# Patient Record
Sex: Female | Born: 1990
Health system: Southern US, Community
[De-identification: ages and names within clinical notes are randomized; demographics above are authoritative.]

## PROBLEM LIST (undated history)

## (undated) DIAGNOSIS — G43909 Migraine, unspecified, not intractable, without status migrainosus: Secondary | ICD-10-CM

## (undated) DIAGNOSIS — R51 Headache: Secondary | ICD-10-CM

## (undated) DIAGNOSIS — F50819 Binge eating disorder, unspecified: Secondary | ICD-10-CM

## (undated) DIAGNOSIS — N730 Acute parametritis and pelvic cellulitis: Secondary | ICD-10-CM

## (undated) DIAGNOSIS — F5081 Binge eating disorder: Secondary | ICD-10-CM

## (undated) DIAGNOSIS — F411 Generalized anxiety disorder: Secondary | ICD-10-CM

## (undated) DIAGNOSIS — F952 Tourette's disorder: Secondary | ICD-10-CM

## (undated) DIAGNOSIS — A499 Bacterial infection, unspecified: Secondary | ICD-10-CM

## (undated) DIAGNOSIS — J45909 Unspecified asthma, uncomplicated: Secondary | ICD-10-CM

## (undated) DIAGNOSIS — I499 Cardiac arrhythmia, unspecified: Secondary | ICD-10-CM

## (undated) DIAGNOSIS — T7840XA Allergy, unspecified, initial encounter: Secondary | ICD-10-CM

## (undated) DIAGNOSIS — N390 Urinary tract infection, site not specified: Secondary | ICD-10-CM

## (undated) DIAGNOSIS — J302 Other seasonal allergic rhinitis: Secondary | ICD-10-CM

## (undated) DIAGNOSIS — R519 Headache, unspecified: Secondary | ICD-10-CM

## (undated) DIAGNOSIS — F325 Major depressive disorder, single episode, in full remission: Secondary | ICD-10-CM

## (undated) DIAGNOSIS — K219 Gastro-esophageal reflux disease without esophagitis: Secondary | ICD-10-CM

## (undated) DIAGNOSIS — F419 Anxiety disorder, unspecified: Secondary | ICD-10-CM

## (undated) HISTORY — DX: Binge eating disorder, unspecified: F50.819

## (undated) HISTORY — PX: WISDOM TOOTH EXTRACTION: SHX21

## (undated) HISTORY — DX: Tourette's disorder: F95.2

## (undated) HISTORY — DX: Gastro-esophageal reflux disease without esophagitis: K21.9

## (undated) HISTORY — DX: Headache: R51

## (undated) HISTORY — PX: CHOLECYSTECTOMY: SHX55

## (undated) HISTORY — DX: Migraine, unspecified, not intractable, without status migrainosus: G43.909

## (undated) HISTORY — DX: Bacterial infection, unspecified: A49.9

## (undated) HISTORY — DX: Other seasonal allergic rhinitis: J30.2

## (undated) HISTORY — DX: Headache, unspecified: R51.9

## (undated) HISTORY — DX: Acute parametritis and pelvic cellulitis: N73.0

## (undated) HISTORY — DX: Urinary tract infection, site not specified: N39.0

## (undated) HISTORY — DX: Allergy, unspecified, initial encounter: T78.40XA

## (undated) HISTORY — DX: Generalized anxiety disorder: F41.1

## (undated) HISTORY — DX: Binge eating disorder: F50.81

## (undated) HISTORY — PX: ESOPHAGOGASTRODUODENOSCOPY: SHX1529

## (undated) HISTORY — DX: Major depressive disorder, single episode, in full remission: F32.5

---
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
## 2011-05-02 HISTORY — PX: TONSILLECTOMY AND ADENOIDECTOMY: SHX28

## 2013-10-16 ENCOUNTER — Ambulatory Visit (INDEPENDENT_AMBULATORY_CARE_PROVIDER_SITE_OTHER): Payer: 59 | Admitting: Nurse Practitioner

## 2013-10-16 ENCOUNTER — Encounter: Payer: Self-pay | Admitting: Nurse Practitioner

## 2013-10-16 VITALS — BP 120/75 | HR 68 | Temp 98.2°F | Ht 65.0 in | Wt 169.0 lb

## 2013-10-16 DIAGNOSIS — F952 Tourette's disorder: Secondary | ICD-10-CM

## 2013-10-16 DIAGNOSIS — R29818 Other symptoms and signs involving the nervous system: Secondary | ICD-10-CM

## 2013-10-16 DIAGNOSIS — R2990 Unspecified symptoms and signs involving the nervous system: Secondary | ICD-10-CM

## 2013-10-16 MED ORDER — AMPHETAMINE-DEXTROAMPHET ER 20 MG PO CP24: 20.0000 mg | ORAL_CAPSULE | Freq: Every day | ORAL | Status: DC

## 2013-10-16 NOTE — Progress Notes (Signed)
Pre visit review using our clinic review tool, if applicable. No additional management support is needed unless otherwise documented below in the visit note. 

## 2013-10-16 NOTE — Patient Instructions (Signed)
Our office will call you after your records are reviewed. Please see neurology. Pleasure to meet you!  Preventive Care for Adults, Female A healthy lifestyle and preventive care can promote health and wellness. Preventive health guidelines for women include the following key practices.  A routine yearly physical is a good way to check with your caregiver about your health and preventive screening. It is a chance to share any concerns and updates on your health, and to receive a thorough exam.  Visit your dentist for a routine exam and preventive care every 6 months. Brush your teeth twice a day and floss once a day. Good oral hygiene prevents tooth decay and gum disease.  The frequency of eye exams is based on your age, health, family medical history, use of contact lenses, and other factors. Follow your caregiver's recommendations for frequency of eye exams.  Eat a healthy diet. Foods like vegetables, fruits, whole grains, low-fat dairy products, and lean protein foods contain the nutrients you need without too many calories. Decrease your intake of foods high in solid fats, added sugars, and salt. Eat the right amount of calories for you.Get information about a proper diet from your caregiver, if necessary.  Regular physical exercise is one of the most important things you can do for your health. Most adults should get at least 150 minutes of moderate-intensity exercise (any activity that increases your heart rate and causes you to sweat) each week. In addition, most adults need muscle-strengthening exercises on 2 or more days a week.  Maintain a healthy weight. The body mass index (BMI) is a screening tool to identify possible weight problems. It provides an estimate of body fat based on height and weight. Your caregiver can help determine your BMI, and can help you achieve or maintain a healthy weight.For adults 20 years and older:  A BMI below 18.5 is considered underweight.  A BMI of 18.5  to 24.9 is normal.  A BMI of 25 to 29.9 is considered overweight.  A BMI of 30 and above is considered obese.  Maintain normal blood lipids and cholesterol levels by exercising and minimizing your intake of saturated fat. Eat a balanced diet with plenty of fruit and vegetables. Blood tests for lipids and cholesterol should begin at age 64 and be repeated every 5 years. If your lipid or cholesterol levels are high, you are over 50, or you are at high risk for heart disease, you may need your cholesterol levels checked more frequently.Ongoing high lipid and cholesterol levels should be treated with medicines if diet and exercise are not effective.  If you smoke, find out from your caregiver how to quit. If you do not use tobacco, do not start.  Lung cancer screening is recommended for adults aged 23 80 years who are at high risk for developing lung cancer because of a history of smoking. Yearly low-dose computed tomography (CT) is recommended for people who have at least a 30-pack-year history of smoking and are a current smoker or have quit within the past 15 years. A pack year of smoking is smoking an average of 1 pack of cigarettes a day for 1 year (for example: 1 pack a day for 30 years or 2 packs a day for 15 years). Yearly screening should continue until the smoker has stopped smoking for at least 15 years. Yearly screening should also be stopped for people who develop a health problem that would prevent them from having lung cancer treatment.  If you are  pregnant, do not drink alcohol. If you are breastfeeding, be very cautious about drinking alcohol. If you are not pregnant and choose to drink alcohol, do not exceed 1 drink per day. One drink is considered to be 12 ounces (355 mL) of beer, 5 ounces (148 mL) of wine, or 1.5 ounces (44 mL) of liquor.  Avoid use of street drugs. Do not share needles with anyone. Ask for help if you need support or instructions about stopping the use of  drugs.  High blood pressure causes heart disease and increases the risk of stroke. Your blood pressure should be checked at least every 1 to 2 years. Ongoing high blood pressure should be treated with medicines if weight loss and exercise are not effective.  If you are 28 to 23 years old, ask your caregiver if you should take aspirin to prevent strokes.  Diabetes screening involves taking a blood sample to check your fasting blood sugar level. This should be done once every 3 years, after age 30, if you are within normal weight and without risk factors for diabetes. Testing should be considered at a younger age or be carried out more frequently if you are overweight and have at least 1 risk factor for diabetes.  Breast cancer screening is essential preventive care for women. You should practice "breast self-awareness." This means understanding the normal appearance and feel of your breasts and may include breast self-examination. Any changes detected, no matter how small, should be reported to a caregiver. Women in their 6s and 30s should have a clinical breast exam (CBE) by a caregiver as part of a regular health exam every 1 to 3 years. After age 40, women should have a CBE every year. Starting at age 77, women should consider having a mammography (breast X-ray test) every year. Women who have a family history of breast cancer should talk to their caregiver about genetic screening. Women at a high risk of breast cancer should talk to their caregivers about having magnetic resonance imaging (MRI) and a mammography every year.  Breast cancer gene (BRCA)-related cancer risk assessment is recommended for women who have family members with BRCA-related cancers. BRCA-related cancers include breast, ovarian, tubal, and peritoneal cancers. Having family members with these cancers may be associated with an increased risk for harmful changes (mutations) in the breast cancer genes BRCA1 and BRCA2. Results of the  assessment will determine the need for genetic counseling and BRCA1 and BRCA2 testing.  The Pap test is a screening test for cervical cancer. A Pap test can show cell changes on the cervix that might become cervical cancer if left untreated. A Pap test is a procedure in which cells are obtained and examined from the lower end of the uterus (cervix).  Women should have a Pap test starting at age 43.  Between ages 23 and 63, Pap tests should be repeated every 2 years.  Beginning at age 10, you should have a Pap test every 3 years as long as the past 3 Pap tests have been normal.  Some women have medical problems that increase the chance of getting cervical cancer. Talk to your caregiver about these problems. It is especially important to talk to your caregiver if a new problem develops soon after your last Pap test. In these cases, your caregiver may recommend more frequent screening and Pap tests.  The above recommendations are the same for women who have or have not gotten the vaccine for human papillomavirus (HPV).  If you had  a hysterectomy for a problem that was not cancer or a condition that could lead to cancer, then you no longer need Pap tests. Even if you no longer need a Pap test, a regular exam is a good idea to make sure no other problems are starting.  If you are between ages 59 and 21, and you have had normal Pap tests going back 10 years, you no longer need Pap tests. Even if you no longer need a Pap test, a regular exam is a good idea to make sure no other problems are starting.  If you have had past treatment for cervical cancer or a condition that could lead to cancer, you need Pap tests and screening for cancer for at least 20 years after your treatment.  If Pap tests have been discontinued, risk factors (such as a new sexual partner) need to be reassessed to determine if screening should be resumed.  The HPV test is an additional test that may be used for cervical cancer  screening. The HPV test looks for the virus that can cause the cell changes on the cervix. The cells collected during the Pap test can be tested for HPV. The HPV test could be used to screen women aged 1 years and older, and should be used in women of any age who have unclear Pap test results. After the age of 76, women should have HPV testing at the same frequency as a Pap test.  Colorectal cancer can be detected and often prevented. Most routine colorectal cancer screening begins at the age of 25 and continues through age 72. However, your caregiver may recommend screening at an earlier age if you have risk factors for colon cancer. On a yearly basis, your caregiver may provide home test kits to check for hidden blood in the stool. Use of a small camera at the end of a tube, to directly examine the colon (sigmoidoscopy or colonoscopy), can detect the earliest forms of colorectal cancer. Talk to your caregiver about this at age 53, when routine screening begins. Direct examination of the colon should be repeated every 5 to 10 years through age 24, unless early forms of pre-cancerous polyps or small growths are found.  Hepatitis C blood testing is recommended for all people born from [birth year] through 1965 and any individual with known risks for hepatitis C.  Practice safe sex. Use condoms and avoid high-risk sexual practices to reduce the spread of sexually transmitted infections (STIs). STIs include gonorrhea, chlamydia, syphilis, trichomonas, herpes, HPV, and human immunodeficiency virus (HIV). Herpes, HIV, and HPV are viral illnesses that have no cure. They can result in disability, cancer, and death. Sexually active women aged 76 and younger should be checked for chlamydia. Older women with new or multiple partners should also be tested for chlamydia. Testing for other STIs is recommended if you are sexually active and at increased risk.  Osteoporosis is a disease in which the bones lose minerals and  strength with aging. This can result in serious bone fractures. The risk of osteoporosis can be identified using a bone density scan. Women ages 18 and over and women at risk for fractures or osteoporosis should discuss screening with their caregivers. Ask your caregiver whether you should take a calcium supplement or vitamin D to reduce the rate of osteoporosis.  Menopause can be associated with physical symptoms and risks. Hormone replacement therapy is available to decrease symptoms and risks. You should talk to your caregiver about whether hormone replacement therapy is  right for you.  Use sunscreen. Apply sunscreen liberally and repeatedly throughout the day. You should seek shade when your shadow is shorter than you. Protect yourself by wearing long sleeves, pants, a wide-brimmed hat, and sunglasses year round, whenever you are outdoors.  Once a month, do a whole body skin exam, using a mirror to look at the skin on your back. Notify your caregiver of new moles, moles that have irregular borders, moles that are larger than a pencil eraser, or moles that have changed in shape or color.  Stay current with required immunizations.  Influenza vaccine. All adults should be immunized every year.  Tetanus, diphtheria, and acellular pertussis (Td, Tdap) vaccine. Pregnant women should receive 1 dose of Tdap vaccine during each pregnancy. The dose should be obtained regardless of the length of time since the last dose. Immunization is preferred during the 27th to 36th week of gestation. An adult who has not previously received Tdap or who does not know her vaccine status should receive 1 dose of Tdap. This initial dose should be followed by tetanus and diphtheria toxoids (Td) booster doses every 10 years. Adults with an unknown or incomplete history of completing a 3-dose immunization series with Td-containing vaccines should begin or complete a primary immunization series including a Tdap dose. Adults should  receive a Td booster every 10 years.  Varicella vaccine. An adult without evidence of immunity to varicella should receive 2 doses or a second dose if she has previously received 1 dose. Pregnant females who do not have evidence of immunity should receive the first dose after pregnancy. This first dose should be obtained before leaving the health care facility. The second dose should be obtained 4 8 weeks after the first dose.  Human papillomavirus (HPV) vaccine. Females aged 80 26 years who have not received the vaccine previously should obtain the 3-dose series. The vaccine is not recommended for use in pregnant females. However, pregnancy testing is not needed before receiving a dose. If a female is found to be pregnant after receiving a dose, no treatment is needed. In that case, the remaining doses should be delayed until after the pregnancy. Immunization is recommended for any person with an immunocompromised condition through the age of 62 years if she did not get any or all doses earlier. During the 3-dose series, the second dose should be obtained 4 8 weeks after the first dose. The third dose should be obtained 24 weeks after the first dose and 16 weeks after the second dose.  Zoster vaccine. One dose is recommended for adults aged 21 years or older unless certain conditions are present.  Measles, mumps, and rubella (MMR) vaccine. Adults born before [birth year] generally are considered immune to measles and mumps. Adults born in [birth year] or later should have 1 or more doses of MMR vaccine unless there is a contraindication to the vaccine or there is laboratory evidence of immunity to each of the three diseases. A routine second dose of MMR vaccine should be obtained at least 28 days after the first dose for students attending postsecondary schools, health care workers, or international travelers. People who received inactivated measles vaccine or an unknown type of measles vaccine during 1963 1967 should  receive 2 doses of MMR vaccine. People who received inactivated mumps vaccine or an unknown type of mumps vaccine before 1979 and are at high risk for mumps infection should consider immunization with 2 doses of MMR vaccine. For females of childbearing age, rubella immunity should be  determined. If there is no evidence of immunity, females who are not pregnant should be vaccinated. If there is no evidence of immunity, females who are pregnant should delay immunization until after pregnancy. Unvaccinated health care workers born before [birth year] who lack laboratory evidence of measles, mumps, or rubella immunity or laboratory confirmation of disease should consider measles and mumps immunization with 2 doses of MMR vaccine or rubella immunization with 1 dose of MMR vaccine.  Pneumococcal 13-valent conjugate (PCV13) vaccine. When indicated, a person who is uncertain of her immunization history and has no record of immunization should receive the PCV13 vaccine. An adult aged 69 years or older who has certain medical conditions and has not been previously immunized should receive 1 dose of PCV13 vaccine. This PCV13 should be followed with a dose of pneumococcal polysaccharide (PPSV23) vaccine. The PPSV23 vaccine dose should be obtained at least 8 weeks after the dose of PCV13 vaccine. An adult aged 74 years or older who has certain medical conditions and previously received 1 or more doses of PPSV23 vaccine should receive 1 dose of PCV13. The PCV13 vaccine dose should be obtained 1 or more years after the last PPSV23 vaccine dose.  Pneumococcal polysaccharide (PPSV23) vaccine. When PCV13 is also indicated, PCV13 should be obtained first. All adults aged 36 years and older should be immunized. An adult younger than age 50 years who has certain medical conditions should be immunized. Any person who resides in a nursing home or long-term care facility should be immunized. An adult smoker should be immunized. People with an  immunocompromised condition and certain other conditions should receive both PCV13 and PPSV23 vaccines. People with human immunodeficiency virus (HIV) infection should be immunized as soon as possible after diagnosis. Immunization during chemotherapy or radiation therapy should be avoided. Routine use of PPSV23 vaccine is not recommended for American Indians, [place] Natives, or people younger than 65 years unless there are medical conditions that require PPSV23 vaccine. When indicated, people who have unknown immunization and have no record of immunization should receive PPSV23 vaccine. One-time revaccination 5 years after the first dose of PPSV23 is recommended for people aged 99 64 years who have chronic kidney failure, nephrotic syndrome, asplenia, or immunocompromised conditions. People who received 1 2 doses of PPSV23 before age 22 years should receive another dose of PPSV23 vaccine at age 48 years or later if at least 5 years have passed since the previous dose. Doses of PPSV23 are not needed for people immunized with PPSV23 at or after age 87 years.  Meningococcal vaccine. Adults with asplenia or persistent complement component deficiencies should receive 2 doses of quadrivalent meningococcal conjugate (MenACWY-D) vaccine. The doses should be obtained at least 2 months apart. Microbiologists working with certain meningococcal bacteria, [place] recruits, people at risk during an outbreak, and people who travel to or live in countries with a high rate of meningitis should be immunized. A first-year college student up through age 30 years who is living in a residence hall should receive a dose if she did not receive a dose on or after her 16th birthday. Adults who have certain high-risk conditions should receive one or more doses of vaccine.  Hepatitis A vaccine. Adults who wish to be protected from this disease, have certain high-risk conditions, work with hepatitis A-infected animals, work in hepatitis A  research labs, or travel to or work in countries with a high rate of hepatitis A should be immunized. Adults who were previously unvaccinated and who anticipate close contact  with an international adoptee during the first 60 days after arrival in the [place] from a country with a high rate of hepatitis A should be immunized.  Hepatitis B vaccine. Adults who wish to be protected from this disease, have certain high-risk conditions, may be exposed to blood or other infectious body fluids, are household contacts or sex partners of hepatitis B positive people, are clients or workers in certain care facilities, or travel to or work in countries with a high rate of hepatitis B should be immunized.  Haemophilus influenzae type b (Hib) vaccine. A previously unvaccinated person with asplenia or sickle cell disease or having a scheduled splenectomy should receive 1 dose of Hib vaccine. Regardless of previous immunization, a recipient of a hematopoietic stem cell transplant should receive a 3-dose series 6 12 months after her successful transplant. Hib vaccine is not recommended for adults with HIV infection. Preventive Services / Frequency Ages 14 to 63  Blood pressure check.** / Every 1 to 2 years.  Lipid and cholesterol check.** / Every 5 years beginning at age 13.  Clinical breast exam.** / Every 3 years for women in their 51s and 23s.  BRCA-related cancer risk assessment.** / For women who have family members with a BRCA-related cancer (breast, ovarian, tubal, or peritoneal cancers).  Pap test.** / Every 2 years from ages 63 through 64. Every 3 years starting at age 69 through age 64 or 67 with a history of 3 consecutive normal Pap tests.  HPV screening.** / Every 3 years from ages 22 through ages 31 to 94 with a history of 3 consecutive normal Pap tests.  Hepatitis C blood test.** / For any individual with known risks for hepatitis C.  Skin self-exam. / Monthly.  Influenza vaccine. / Every  year.  Tetanus, diphtheria, and acellular pertussis (Tdap, Td) vaccine.** / Consult your caregiver. Pregnant women should receive 1 dose of Tdap vaccine during each pregnancy. 1 dose of Td every 10 years.  Varicella vaccine.** / Consult your caregiver. Pregnant females who do not have evidence of immunity should receive the first dose after pregnancy.  HPV vaccine. / 3 doses over 6 months, if 29 and younger. The vaccine is not recommended for use in pregnant females. However, pregnancy testing is not needed before receiving a dose.  Measles, mumps, rubella (MMR) vaccine.** / You need at least 1 dose of MMR if you were born in 1957 or later. You may also need a 2nd dose. For females of childbearing age, rubella immunity should be determined. If there is no evidence of immunity, females who are not pregnant should be vaccinated. If there is no evidence of immunity, females who are pregnant should delay immunization until after pregnancy.  Pneumococcal 13-valent conjugate (PCV13) vaccine.** / Consult your caregiver.  Pneumococcal polysaccharide (PPSV23) vaccine.** / 1 to 2 doses if you smoke cigarettes or if you have certain conditions.  Meningococcal vaccine.** / 1 dose if you are age 68 to 34 years and a Market researcher living in a residence hall, or have one of several medical conditions, you need to get vaccinated against meningococcal disease. You may also need additional booster doses.  Hepatitis A vaccine.** / Consult your caregiver.  Hepatitis B vaccine.** / Consult your caregiver.  Haemophilus influenzae type b (Hib) vaccine.** / Consult your caregiver. Ages 64 to 29  Blood pressure check.** / Every 1 to 2 years.  Lipid and cholesterol check.** / Every 5 years beginning at age 84.  Lung cancer screening. /  Every year if you are aged 91 80 years and have a 30-pack-year history of smoking and currently smoke or have quit within the past 15 years. Yearly screening is stopped  once you have quit smoking for at least 15 years or develop a health problem that would prevent you from having lung cancer treatment.  Clinical breast exam.** / Every year after age 6.  BRCA-related cancer risk assessment.** / For women who have family members with a BRCA-related cancer (breast, ovarian, tubal, or peritoneal cancers).  Mammogram.** / Every year beginning at age 47 and continuing for as long as you are in good health. Consult with your caregiver.  Pap test.** / Every 3 years starting at age 50 through age 3 or 67 with a history of 3 consecutive normal Pap tests.  HPV screening.** / Every 3 years from ages 27 through ages 33 to 39 with a history of 3 consecutive normal Pap tests.  Fecal occult blood test (FOBT) of stool. / Every year beginning at age 18 and continuing until age 27. You may not need to do this test if you get a colonoscopy every 10 years.  Flexible sigmoidoscopy or colonoscopy.** / Every 5 years for a flexible sigmoidoscopy or every 10 years for a colonoscopy beginning at age 70 and continuing until age 22.  Hepatitis C blood test.** / For all people born from [birth year] through 1965 and any individual with known risks for hepatitis C.  Skin self-exam. / Monthly.  Influenza vaccine. / Every year.  Tetanus, diphtheria, and acellular pertussis (Tdap/Td) vaccine.** / Consult your caregiver. Pregnant women should receive 1 dose of Tdap vaccine during each pregnancy. 1 dose of Td every 10 years.  Varicella vaccine.** / Consult your caregiver. Pregnant females who do not have evidence of immunity should receive the first dose after pregnancy.  Zoster vaccine.** / 1 dose for adults aged 67 years or older.  Measles, mumps, rubella (MMR) vaccine.** / You need at least 1 dose of MMR if you were born in 1957 or later. You may also need a 2nd dose. For females of childbearing age, rubella immunity should be determined. If there is no evidence of immunity, females who are  not pregnant should be vaccinated. If there is no evidence of immunity, females who are pregnant should delay immunization until after pregnancy.  Pneumococcal 13-valent conjugate (PCV13) vaccine.** / Consult your caregiver.  Pneumococcal polysaccharide (PPSV23) vaccine.** / 1 to 2 doses if you smoke cigarettes or if you have certain conditions.  Meningococcal vaccine.** / Consult your caregiver.  Hepatitis A vaccine.** / Consult your caregiver.  Hepatitis B vaccine.** / Consult your caregiver.  Haemophilus influenzae type b (Hib) vaccine.** / Consult your caregiver. Ages 57 and over  Blood pressure check.** / Every 1 to 2 years.  Lipid and cholesterol check.** / Every 5 years beginning at age 36.  Lung cancer screening. / Every year if you are aged 32 80 years and have a 30-pack-year history of smoking and currently smoke or have quit within the past 15 years. Yearly screening is stopped once you have quit smoking for at least 15 years or develop a health problem that would prevent you from having lung cancer treatment.  Clinical breast exam.** / Every year after age 42.  BRCA-related cancer risk assessment.** / For women who have family members with a BRCA-related cancer (breast, ovarian, tubal, or peritoneal cancers).  Mammogram.** / Every year beginning at age 51 and continuing for as long as you are  in good health. Consult with your caregiver.  Pap test.** / Every 3 years starting at age 67 through age 65 or 79 with a 3 consecutive normal Pap tests. Testing can be stopped between 65 and 70 with 3 consecutive normal Pap tests and no abnormal Pap or HPV tests in the past 10 years.  HPV screening.** / Every 3 years from ages 56 through ages 69 or 42 with a history of 3 consecutive normal Pap tests. Testing can be stopped between 65 and 70 with 3 consecutive normal Pap tests and no abnormal Pap or HPV tests in the past 10 years.  Fecal occult blood test (FOBT) of stool. / Every year  beginning at age 62 and continuing until age 52. You may not need to do this test if you get a colonoscopy every 10 years.  Flexible sigmoidoscopy or colonoscopy.** / Every 5 years for a flexible sigmoidoscopy or every 10 years for a colonoscopy beginning at age 68 and continuing until age 57.  Hepatitis C blood test.** / For all people born from [birth year] through 1965 and any individual with known risks for hepatitis C.  Osteoporosis screening.** / A one-time screening for women ages 35 and over and women at risk for fractures or osteoporosis.  Skin self-exam. / Monthly.  Influenza vaccine. / Every year.  Tetanus, diphtheria, and acellular pertussis (Tdap/Td) vaccine.** / 1 dose of Td every 10 years.  Varicella vaccine.** / Consult your caregiver.  Zoster vaccine.** / 1 dose for adults aged 36 years or older.  Pneumococcal 13-valent conjugate (PCV13) vaccine.** / Consult your caregiver.  Pneumococcal polysaccharide (PPSV23) vaccine.** / 1 dose for all adults aged 107 years and older.  Meningococcal vaccine.** / Consult your caregiver.  Hepatitis A vaccine.** / Consult your caregiver.  Hepatitis B vaccine.** / Consult your caregiver.  Haemophilus influenzae type b (Hib) vaccine.** / Consult your caregiver. ** Family history and personal history of risk and conditions may change your caregiver's recommendations. Document Released: 06/13/2001 Document Revised: 08/12/2012 Document Reviewed: 09/12/2010 [facility] Patient Information 2014 [place], [place].

## 2013-10-18 ENCOUNTER — Encounter: Payer: Self-pay | Admitting: Nurse Practitioner

## 2013-10-18 DIAGNOSIS — R2990 Unspecified symptoms and signs involving the nervous system: Secondary | ICD-10-CM | POA: Insufficient documentation

## 2013-10-18 DIAGNOSIS — F952 Tourette's disorder: Secondary | ICD-10-CM | POA: Insufficient documentation

## 2013-10-18 NOTE — Progress Notes (Signed)
Subjective:     Cindy Jones is a 23 y.o. female and is here to establish care. She just moved back to the area after attending boarding school in [place] [employer] in [place]. She lived with her aunt & uncle for a few mos in [place], [place]. [facility] She is currently treated for tourett's syndrome w/ adderall. She gives report of Tick in eye accompanied by HA since she was 23 yo. She has been seeing psychiatry for this disorder. Her MGF & M aunt are also treated for touretts. She gives Hx of unplanned pregnancy-aborted 6 mo ago & was treated for STI last week. She has no symptoms, but tested pos for chlamydia at [facility]. She has not been sexually active for severl mos, but last partner called her, stating he had been treated for chlamydia.  Today, she is requesting refill on adderall.  History   Social History  . Marital Status: Single    Spouse Name: N/A    Number of Children: 0  . Years of Education: N/A   Occupational History  . Not on file.   Social History Main Topics  . Smoking status: Never Smoker   . Smokeless tobacco: Never Used  . Alcohol Use: 1.8 oz/week    3 Glasses of wine per week  . Drug Use: No  . Sexual Activity: Not Currently   Other Topics Concern  . Not on file   Social History Narrative   Cindy Jones recently graduated from [employer]. She works FT as a Patent attorneyloan broker. She attended boarding school in [place] when in HS. Her father died when she was 89 yo. Her mother is an alcoholic. Her aunt & uncle were her guardians when she was in HS. [place] live in [place], [place]. She has a fraternal twin sister.    Health Maintenance  Topic Date Due  . Tetanus/tdap  11/19/2009  . Influenza Vaccine  11/29/2013  . Pap Smear  10/19/2015    The following portions of the patient's history were reviewed and updated as appropriate: allergies, current medications, past family history,  past medical history, past social history, past surgical history and problem list.  Review of Systems Pertinent items are noted in HPI.   Objective:    BP 120/75  Pulse 68  Temp(Src) 98.2 F (36.8 C) (Temporal)  Ht 5\' 5"  (1.651 m)  Wt 169 lb (76.658 kg)  BMI 28.12 kg/m2  SpO2 100%  LMP 10/11/2013 General appearance: alert, cooperative, appears stated age and no distress Head: Normocephalic, without obvious abnormality, atraumatic Eyes: negative findings: lids and lashes normal, conjunctivae and sclerae normal, corneas clear, pupils equal, round, reactive to light and accomodation and disconjugate gaze: L eye drifts laterally when following moving finger in central position. Ears: normal TM's and external ear canals both ears Throat: lips, mucosa, and tongue normal; teeth and gums normal Lungs: clear to auscultation bilaterally Heart: regular rate and rhythm, S1, S2 normal, no murmur, click, rub or gallop Abdomen: soft, non-tender; bowel sounds normal; no masses,  no organomegaly Extremities: extremities normal, atraumatic, no cyanosis or edema Pulses: 2+ and symmetric Lymph nodes: Cervical, supraclavicular, and axillary nodes normal. Neurologic: Alert and oriented X 3, normal strength and tone. Normal symmetric reflexes. Normal coordination and gait Cranial nerves: III,IV,VI: extraocular muscles L eye drifts laterally w/central gaze Gait: Normal    Assessment:   1. Tourette disease Pt report. Treated by psychiatrist -  amphetamine-dextroamphetamine (ADDERALL XR) 20 MG 24 hr capsule; Take 1 capsule (20 mg total) by mouth daily.  Dispense: 30 capsule; Refill: 0 - Ambulatory referral to Neurology  2. Abnormal neurological exam Disconjugate gaz: L eye drift  - Ambulatory referral to Neurology  Request historical records. Will determine need for blood work & furher test after review.

## 2013-10-28 ENCOUNTER — Other Ambulatory Visit: Payer: Self-pay | Admitting: Nurse Practitioner

## 2013-10-28 DIAGNOSIS — F952 Tourette's disorder: Secondary | ICD-10-CM

## 2013-10-29 ENCOUNTER — Encounter: Payer: Self-pay | Admitting: Nurse Practitioner

## 2013-10-29 ENCOUNTER — Telehealth: Payer: Self-pay | Admitting: Nurse Practitioner

## 2013-10-29 DIAGNOSIS — Z113 Encounter for screening for infections with a predominantly sexual mode of transmission: Secondary | ICD-10-CM | POA: Insufficient documentation

## 2013-10-29 NOTE — Telephone Encounter (Signed)
LMOM to CB. 

## 2013-10-29 NOTE — Telephone Encounter (Signed)
pls call pt: Advise I reviewed [facility] notes: she did not have chlamydia infection. Other tests were neg also: gonorrhea, HIV, & syphyllis. Standard care is to treat before results come back-no harm that she received treatment.

## 2013-11-03 NOTE — Telephone Encounter (Signed)
LMOm to CB.

## 2013-11-04 NOTE — Telephone Encounter (Signed)
LM : [facility] from [facility] for best management for Tourette's. Appt. W/Dr Tat cancelled. Please advise pt if she returns call /has questions.

## 2013-11-05 ENCOUNTER — Telehealth (HOSPITAL_COMMUNITY): Payer: Self-pay

## 2013-11-07 ENCOUNTER — Ambulatory Visit: Payer: 59 | Admitting: Neurology

## 2013-11-18 ENCOUNTER — Encounter: Payer: Self-pay | Admitting: Nurse Practitioner

## 2013-11-18 ENCOUNTER — Ambulatory Visit (INDEPENDENT_AMBULATORY_CARE_PROVIDER_SITE_OTHER): Payer: 59 | Admitting: Nurse Practitioner

## 2013-11-18 VITALS — BP 114/77 | HR 74 | Temp 98.6°F | Ht 65.0 in | Wt 171.0 lb

## 2013-11-18 DIAGNOSIS — Z309 Encounter for contraceptive management, unspecified: Secondary | ICD-10-CM

## 2013-11-18 DIAGNOSIS — H53149 Visual discomfort, unspecified: Secondary | ICD-10-CM

## 2013-11-18 DIAGNOSIS — F952 Tourette's disorder: Secondary | ICD-10-CM

## 2013-11-18 NOTE — Progress Notes (Signed)
Pre visit review using our clinic review tool, if applicable. No additional management support is needed unless otherwise documented below in the visit note. 

## 2013-11-18 NOTE — Patient Instructions (Signed)
Please get eye exam. See neurologist. See gynecology. Schedule physical with me at your convenience. This fall will be a good time as you can consider getting flu vaccine. Nice to see you!

## 2013-11-19 DIAGNOSIS — Z309 Encounter for contraceptive management, unspecified: Secondary | ICD-10-CM | POA: Insufficient documentation

## 2013-11-19 DIAGNOSIS — H5310 Unspecified subjective visual disturbances: Secondary | ICD-10-CM | POA: Insufficient documentation

## 2013-11-19 NOTE — Assessment & Plan Note (Signed)
Appt w/neuro pending for eval & Treatment. Will refill adderall for another 30 days, until she can be evaluated by neuro.

## 2013-11-19 NOTE — Assessment & Plan Note (Signed)
Unplanned pregnancy in last 6 mos.  Does not desire pregnancy now. Sexually active. Uses condoms. Taking preg category C med. Took OBCP in past-caused moodiness. Suggest mirena. Ref to gyn for eval & Tx.

## 2013-11-19 NOTE — Progress Notes (Signed)
Subjective:     Cindy Jones is a 23 y.o. female who presents for follow up for treatment of tourettes. This is historical diagnosis, treated [place] adderall [place] psychiatrist in [place] I refilled adderall for 30 days & referred her to neurologist for eval & treatment of tourettes and for abnormal gross neuro exam finding: disconjugate eye movement. She has appt. Pending with neurology next week. We also discussed contraception management: she does not desire pregnancy now, is sexually active, uses condoms, had unplanned pregnancy 6 mos ago.She used COBCP in past, but stopped due to moodiness. She does not wish to use them again. She is interested in mirena.    The following portions of the patient's history were reviewed and updated as appropriate: allergies, current medications, past family history, past medical history, past social history, past surgical history and problem list.  Review of Systems Pertinent items are noted in HPI.    Objective:    BP 114/77  Pulse 74  Temp(Src) 98.6 F (37 C) (Oral)  Ht 5\' 5"  (1.651 m)  Wt 171 lb (77.565 kg)  BMI 28.46 kg/m2  SpO2 100%  LMP 11/16/2013 BP 114/77  Pulse 74  Temp(Src) 98.6 F (37 C) (Oral)  Ht 5\' 5"  (1.651 m)  Wt 171 lb (77.565 kg)  BMI 28.46 kg/m2  SpO2 100%  LMP 11/16/2013 General appearance: alert, cooperative, appears stated age and no distress Head: Normocephalic, without obvious abnormality, atraumatic Eyes: negative findings: lids and lashes normal, conjunctivae and sclerae normal, corneas clear and pupils equal, round, reactive to light and accomodation Neurologic: cranial nerves nml w/exception of 3,4,6-L eye does not track smoothly, she does better if concentrates. Patellar reflexes nml, strength in uE & LE 5/5 & =.     Assessment:  1. Unspecified contraceptive management Desires mirena - Ambulatory referral to Gynecology  2. Eye fatigue, unspecified laterality End of day, disconjugate tracking Sees neuology next  week - Ambulatory referral to Optometry  3. Tourette disease Continue adderall See neurology for eval & treat

## 2013-11-24 ENCOUNTER — Other Ambulatory Visit: Payer: 59

## 2013-11-24 ENCOUNTER — Emergency Department (HOSPITAL_BASED_OUTPATIENT_CLINIC_OR_DEPARTMENT_OTHER): Payer: 59

## 2013-11-24 ENCOUNTER — Other Ambulatory Visit: Payer: Self-pay | Admitting: Nurse Practitioner

## 2013-11-24 ENCOUNTER — Telehealth: Payer: Self-pay | Admitting: Family Medicine

## 2013-11-24 ENCOUNTER — Inpatient Hospital Stay (HOSPITAL_BASED_OUTPATIENT_CLINIC_OR_DEPARTMENT_OTHER)
Admission: EM | Admit: 2013-11-24 | Discharge: 2013-11-26 | DRG: 759 | Disposition: A | Discharge status: Home / Self Care | Payer: 59 | Attending: Obstetrics & Gynecology | Admitting: Obstetrics & Gynecology

## 2013-11-24 ENCOUNTER — Encounter: Payer: Self-pay | Admitting: Nurse Practitioner

## 2013-11-24 ENCOUNTER — Encounter (HOSPITAL_BASED_OUTPATIENT_CLINIC_OR_DEPARTMENT_OTHER): Payer: Self-pay | Admitting: Emergency Medicine

## 2013-11-24 ENCOUNTER — Ambulatory Visit (INDEPENDENT_AMBULATORY_CARE_PROVIDER_SITE_OTHER): Payer: 59 | Admitting: Nurse Practitioner

## 2013-11-24 VITALS — BP 111/74 | HR 105 | Temp 98.9°F | Ht 65.0 in | Wt 168.0 lb

## 2013-11-24 DIAGNOSIS — F952 Tourette's disorder: Secondary | ICD-10-CM | POA: Diagnosis present

## 2013-11-24 DIAGNOSIS — R1031 Right lower quadrant pain: Secondary | ICD-10-CM

## 2013-11-24 DIAGNOSIS — R109 Unspecified abdominal pain: Secondary | ICD-10-CM

## 2013-11-24 DIAGNOSIS — F509 Eating disorder, unspecified: Secondary | ICD-10-CM

## 2013-11-24 DIAGNOSIS — N730 Acute parametritis and pelvic cellulitis: Secondary | ICD-10-CM

## 2013-11-24 DIAGNOSIS — Z8249 Family history of ischemic heart disease and other diseases of the circulatory system: Secondary | ICD-10-CM | POA: Diagnosis not present

## 2013-11-24 LAB — CBC WITH DIFFERENTIAL/PLATELET
Basophils Absolute: 0 10*3/uL (ref 0.0–0.1)
Basophils Relative: 0.1 % (ref 0.0–3.0)
Eosinophils Absolute: 0 10*3/uL (ref 0.0–0.7)
Eosinophils Relative: 0 % (ref 0.0–5.0)
HCT: 37.7 % (ref 36.0–46.0)
Hemoglobin: 12.5 g/dL (ref 12.0–15.0)
Lymphocytes Relative: 4.3 % — ABNORMAL LOW (ref 12.0–46.0)
Lymphs Abs: 0.8 10*3/uL (ref 0.7–4.0)
MCHC: 33.1 g/dL (ref 30.0–36.0)
MCV: 91.1 fl (ref 78.0–100.0)
Monocytes Absolute: 1 10*3/uL (ref 0.1–1.0)
Monocytes Relative: 5.8 % (ref 3.0–12.0)
Neutro Abs: 16.1 10*3/uL — ABNORMAL HIGH (ref 1.4–7.7)
Neutrophils Relative %: 89.8 % — ABNORMAL HIGH (ref 43.0–77.0)
Platelets: 145 10*3/uL — ABNORMAL LOW (ref 150.0–400.0)
RBC: 4.14 Mil/uL (ref 3.87–5.11)
RDW: 14 % (ref 11.5–15.5)
WBC: 17.9 10*3/uL — ABNORMAL HIGH (ref 4.0–10.5)

## 2013-11-24 LAB — URINALYSIS, ROUTINE W REFLEX MICROSCOPIC
Glucose, UA: NEGATIVE mg/dL
Hgb urine dipstick: NEGATIVE
Ketones, ur: 15 mg/dL — AB
Nitrite: NEGATIVE
Protein, ur: 100 mg/dL — AB
Specific Gravity, Urine: 1.038 — ABNORMAL HIGH (ref 1.005–1.030)
Urobilinogen, UA: 1 mg/dL (ref 0.0–1.0)
pH: 6 (ref 5.0–8.0)

## 2013-11-24 LAB — COMPREHENSIVE METABOLIC PANEL
ALT: 16 U/L (ref 0–35)
ALT: 14 U/L (ref 0–35)
AST: 23 U/L (ref 0–37)
AST: 19 U/L (ref 0–37)
Albumin: 4.2 g/dL (ref 3.5–5.2)
Albumin: 4.3 g/dL (ref 3.5–5.2)
Alkaline Phosphatase: 46 U/L (ref 39–117)
Alkaline Phosphatase: 51 U/L (ref 39–117)
Anion gap: 13 (ref 5–15)
BUN: 8 mg/dL (ref 6–23)
BUN: 9 mg/dL (ref 6–23)
CO2: 26 mEq/L (ref 19–32)
CO2: 25 mEq/L (ref 19–32)
Calcium: 9 mg/dL (ref 8.4–10.5)
Calcium: 9.3 mg/dL (ref 8.4–10.5)
Chloride: 105 mEq/L (ref 96–112)
Chloride: 101 mEq/L (ref 96–112)
Creatinine, Ser: 0.8 mg/dL (ref 0.4–1.2)
Creatinine, Ser: 0.8 mg/dL (ref 0.50–1.10)
GFR calc Af Amer: >90 mL/min (ref >90)
GFR calc non Af Amer: >90 mL/min (ref >90)
GFR: 89.29 mL/min (ref >60.00)
Glucose, Bld: 102 mg/dL — ABNORMAL HIGH (ref 70–99)
Glucose, Bld: 117 mg/dL — ABNORMAL HIGH (ref 70–99)
Potassium: 4 mEq/L (ref 3.5–5.1)
Potassium: 4.1 mEq/L (ref 3.7–5.3)
Sodium: 138 mEq/L (ref 135–145)
Sodium: 139 mEq/L (ref 137–147)
Total Bilirubin: 1.1 mg/dL (ref 0.2–1.2)
Total Bilirubin: 1 mg/dL (ref 0.3–1.2)
Total Protein: 6.7 g/dL (ref 6.0–8.3)
Total Protein: 7.3 g/dL (ref 6.0–8.3)

## 2013-11-24 LAB — POCT URINALYSIS DIPSTICK
Blood, UA: NEGATIVE
Glucose, UA: NEGATIVE
Ketones, UA: 15
Nitrite, UA: NEGATIVE
Protein, UA: 300
Spec Grav, UA: 1.02
Urobilinogen, UA: 1
pH, UA: 7

## 2013-11-24 LAB — CBC
HCT: 36.5 % (ref 36.0–46.0)
Hemoglobin: 12.2 g/dL (ref 12.0–15.0)
MCH: 30.2 pg (ref 26.0–34.0)
MCHC: 33.4 g/dL (ref 30.0–36.0)
MCV: 90.3 fL (ref 78.0–100.0)
Platelets: 154 10*3/uL (ref 150–400)
RBC: 4.04 MIL/uL (ref 3.87–5.11)
RDW: 12.9 % (ref 11.5–15.5)
WBC: 20.7 10*3/uL — ABNORMAL HIGH (ref 4.0–10.5)

## 2013-11-24 LAB — LIPASE, BLOOD: Lipase: 16 U/L (ref 11–59)

## 2013-11-24 LAB — URINE MICROSCOPIC-ADD ON

## 2013-11-24 LAB — WET PREP, GENITAL
Trich, Wet Prep: NONE SEEN
Yeast Wet Prep HPF POC: NONE SEEN

## 2013-11-24 LAB — POCT URINE PREGNANCY: Preg Test, Ur: NEGATIVE

## 2013-11-24 LAB — I-STAT CG4 LACTIC ACID, ED: Lactic Acid, Venous: 0.55 mmol/L (ref 0.5–2.2)

## 2013-11-24 MED ORDER — SODIUM CHLORIDE 0.9 % IV BOLUS (SEPSIS): 1000.0000 mL | Freq: Once | INTRAVENOUS | Status: DC

## 2013-11-24 MED ORDER — PRENATAL MULTIVITAMIN CH
1.0000 | ORAL_TABLET | Freq: Every day | ORAL | Status: DC
  Administered 2013-11-25: 1 via ORAL
  Filled 2013-11-24: qty 1

## 2013-11-24 MED ORDER — AZITHROMYCIN 250 MG PO TABS
1000.0000 mg | ORAL_TABLET | Freq: Once | ORAL | Status: AC
  Administered 2013-11-24: 1000 mg via ORAL
  Filled 2013-11-24: qty 4

## 2013-11-24 MED ORDER — DOXYCYCLINE HYCLATE 100 MG IV SOLR
100.0000 mg | Freq: Two times a day (BID) | INTRAVENOUS | Status: DC
  Administered 2013-11-25 – 2013-11-26 (×3): 100 mg via INTRAVENOUS
  Filled 2013-11-24 (×4): qty 100

## 2013-11-24 MED ORDER — CEFTRIAXONE SODIUM 250 MG IJ SOLR: 250.0000 mg | Freq: Once | INTRAMUSCULAR | Status: DC

## 2013-11-24 MED ORDER — MORPHINE SULFATE 4 MG/ML IJ SOLN
4.0000 mg | Freq: Once | INTRAMUSCULAR | Status: AC
  Administered 2013-11-24: 4 mg via INTRAVENOUS
  Filled 2013-11-24: qty 1

## 2013-11-24 MED ORDER — HYDROMORPHONE HCL PF 1 MG/ML IJ SOLN
1.0000 mg | Freq: Once | INTRAMUSCULAR | Status: AC
  Administered 2013-11-24: 1 mg via INTRAVENOUS
  Filled 2013-11-24: qty 1

## 2013-11-24 MED ORDER — ZOLPIDEM TARTRATE 5 MG PO TABS: 5.0000 mg | ORAL_TABLET | Freq: Every evening | ORAL | Status: DC | PRN

## 2013-11-24 MED ORDER — DEXTROSE 5 % IV SOLN
1.0000 g | Freq: Four times a day (QID) | INTRAVENOUS | Status: DC
  Administered 2013-11-24 – 2013-11-26 (×7): 1 g via INTRAVENOUS
  Filled 2013-11-24 (×9): qty 1

## 2013-11-24 MED ORDER — KETOROLAC TROMETHAMINE 30 MG/ML IJ SOLN: 30.0000 mg | Freq: Four times a day (QID) | INTRAMUSCULAR | Status: DC

## 2013-11-24 MED ORDER — HYDROMORPHONE HCL PF 1 MG/ML IJ SOLN
0.2000 mg | INTRAMUSCULAR | Status: DC | PRN
  Administered 2013-11-24: 0.4 mg via INTRAVENOUS
  Filled 2013-11-24: qty 1

## 2013-11-24 MED ORDER — ONDANSETRON HCL 4 MG/2ML IJ SOLN
4.0000 mg | Freq: Once | INTRAMUSCULAR | Status: AC
  Administered 2013-11-24: 4 mg via INTRAVENOUS
  Filled 2013-11-24: qty 2

## 2013-11-24 MED ORDER — IOHEXOL 300 MG/ML  SOLN
100.0000 mL | Freq: Once | INTRAMUSCULAR | Status: AC | PRN
  Administered 2013-11-24: 100 mL via INTRAVENOUS

## 2013-11-24 MED ORDER — ONDANSETRON HCL 4 MG PO TABS: 4.0000 mg | ORAL_TABLET | Freq: Four times a day (QID) | ORAL | Status: DC | PRN

## 2013-11-24 MED ORDER — SODIUM CHLORIDE 0.9 % IV BOLUS (SEPSIS)
1000.0000 mL | Freq: Once | INTRAVENOUS | Status: AC
  Administered 2013-11-24: 500 mL via INTRAVENOUS

## 2013-11-24 MED ORDER — ONDANSETRON HCL 4 MG/2ML IJ SOLN
4.0000 mg | Freq: Four times a day (QID) | INTRAMUSCULAR | Status: DC | PRN
  Administered 2013-11-25: 4 mg via INTRAVENOUS
  Filled 2013-11-24: qty 2

## 2013-11-24 MED ORDER — HYDROCODONE-ACETAMINOPHEN 5-325 MG PO TABS: 1.0000 | ORAL_TABLET | Freq: Four times a day (QID) | ORAL | Status: DC | PRN

## 2013-11-24 MED ORDER — SODIUM CHLORIDE 0.9 % IV BOLUS (SEPSIS)
1000.0000 mL | Freq: Once | INTRAVENOUS | Status: AC
  Administered 2013-11-24: 1000 mL via INTRAVENOUS

## 2013-11-24 MED ORDER — IOHEXOL 300 MG/ML  SOLN
50.0000 mL | Freq: Once | INTRAMUSCULAR | Status: AC | PRN
  Administered 2013-11-24: 50 mL via ORAL

## 2013-11-24 MED ORDER — CEFTRIAXONE SODIUM 1 G IJ SOLR
INTRAMUSCULAR | Status: AC
  Filled 2013-11-24: qty 10

## 2013-11-24 MED ORDER — DOXYCYCLINE HYCLATE 100 MG PO TABS
100.0000 mg | ORAL_TABLET | Freq: Once | ORAL | Status: AC
  Administered 2013-11-24: 100 mg via ORAL
  Filled 2013-11-24: qty 1

## 2013-11-24 MED ORDER — OXYCODONE-ACETAMINOPHEN 5-325 MG PO TABS
1.0000 | ORAL_TABLET | ORAL | Status: DC | PRN
Start: 2013-11-24 — End: 2013-11-26
  Administered 2013-11-26 (×2): 1 via ORAL
  Filled 2013-11-24 (×3): qty 1

## 2013-11-24 MED ORDER — KETOROLAC TROMETHAMINE 30 MG/ML IJ SOLN
30.0000 mg | Freq: Four times a day (QID) | INTRAMUSCULAR | Status: DC
  Administered 2013-11-24 – 2013-11-26 (×7): 30 mg via INTRAVENOUS
  Filled 2013-11-24 (×7): qty 1

## 2013-11-24 MED ORDER — DEXTROSE 5 % IV SOLN
1.0000 g | Freq: Once | INTRAVENOUS | Status: AC
  Administered 2013-11-24: 1 g via INTRAVENOUS

## 2013-11-24 NOTE — ED Notes (Addendum)
C/o lower abd pain with RLQ tenderness-started 1am today-sent from PCP instead of planned out pt studies due to pt states she felt like she was going to throw up and passed out in the office-pt was not given rx as listed on med list prior to leaving office-mother is with pt-pt alert with no distress except pain at this time

## 2013-11-24 NOTE — Progress Notes (Signed)
Subjective:     Cindy Jones is a 23 y.o. female who presents for evaluation of abdominal pain. She is accompanied by her mother. Onset was 8 hours ago. Last night she felt tired & no appetite. Symptoms have worsened, she woke at 1am w/ pounding HA & intense diffuse abdominal pain. The pain is described as aching and sharp, and is 7/10 in intensity. Pain is located in the diffuse without radiation.  Aggravating factors: movement.  Alleviating factors: 800 mg ibuprophen helped HA, abdominal pain has intensified. Associated symptoms: anorexia, chills, headache, nausea and sweats. The patient denies belching, constipation, diarrhea and vomiting.  The patient's history has been marked as reviewed and updated as appropriate.  Review of Systems Pertinent items are noted in HPI.     Objective:    BP 111/74  Pulse 105  Temp(Src) 98.9 F (37.2 C) (Oral)  Ht 5\' 5"  (1.651 m)  Wt 168 lb (76.204 kg)  BMI 27.96 kg/m2  SpO2 97%  LMP 11/16/2013 General appearance: alert, cooperative, appears stated age and mild distress Head: Normocephalic, without obvious abnormality, atraumatic Eyes: negative findings: lids and lashes normal, conjunctivae and sclerae normal and pupils equal, round, reactive to light and accomodation Throat: lips, mucosa, and tongue normal; teeth and gums normal Lungs: clear to auscultation bilaterally Heart: regular rate and rhythm, S1, S2 normal, no murmur, click, rub or gallop Abdomen: pt cries when barely touch abdomen in all quadrants. Unable to peform exam. Extremities: extremities normal, atraumatic, no cyanosis or edema Lymph nodes: Cervical adenopathy: shoddy anterior cervical & submental nodes.    Assessment:    Abdominal pain, likely secondary to appendicitis .   POC UA -pos ketones, protein, leuks, bilirubin. POC preg-neg.  Plan:  Pt refuses IM toradol & phenergen in ofc. CBC, CMET pending. Initially was going to manage as out-patient w/ abd CT, but pt  intolerable to position changes-became diaphoretic w/near syncope & worse nausea in office as leaving. Go to ER. Likely needs IV hydration, & meds. Offered to call EMS, mother states she will drive to ER.

## 2013-11-24 NOTE — Telephone Encounter (Signed)
FYI

## 2013-11-24 NOTE — ED Notes (Signed)
Attempted to call report; nurse to return call.

## 2013-11-24 NOTE — Patient Instructions (Addendum)
This may be urinary tract infection, but I don't want to miss appendicitis.  Please get CT scan of abdomen. I am sending urine for culture.  My office will call with results.

## 2013-11-24 NOTE — ED Notes (Signed)
MD at bedside. 

## 2013-11-24 NOTE — ED Provider Notes (Signed)
CSN: [id number]     Arrival date & time 11/24/13  1119 History   First MD Initiated Contact with Patient 11/24/13 1140     Chief Complaint  Patient presents with  . Abdominal Pain     (Consider location/radiation/quality/duration/timing/severity/associated sxs/prior Treatment) HPI Pt presenting with c/o abdominal pain. Pt states that pain began suddenly in the middle of the night.  She states pain is more on the right side.  She states pain comes in waves and is very intense.  She was seen at her doctor's office and had an acute wave of pain, became nauseated/vomiting and syncopized.  No fever.  Denies dysuria.  Denies back/flank pain.  There are no other associated systemic symptoms, there are no other alleviating or modifying factors.   Past Medical History  Diagnosis Date  . Frequent headaches   . Migraines   . Urinary tract bacterial infections   . Tourette disorder   . Binge eating disorder    Past Surgical History  Procedure Laterality Date  . Tonsillectomy and adenoidectomy  2013   Family History  Problem Relation Age of Onset  . Alcohol abuse Mother   . Cancer Father     sarcoma  . Tourette syndrome Maternal Aunt   . Hyperlipidemia Paternal Aunt   . Tourette syndrome Maternal Grandfather   . Heart disease Paternal Grandfather    History  Substance Use Topics  . Smoking status: Never Smoker   . Smokeless tobacco: Never Used  . Alcohol Use: 1.8 oz/week    3 Glasses of wine per week   OB History   Grav Para Term Preterm Abortions TAB SAB Ect Mult Living   1 0 0 0 1 0 1 0 0 0      Review of Systems ROS reviewed and all otherwise negative except for mentioned in HPI    Allergies  Latex and Sulfa antibiotics  Home Medications   Prior to Admission medications   Medication Sig Start Date End Date Taking? Authorizing Provider  amphetamine-dextroamphetamine (ADDERALL XR) 20 MG 24 hr capsule Take 1 capsule (20 mg total) by mouth daily. 10/16/13  Yes Kelle Darting, NP  aspirin-acetaminophen-caffeine (EXCEDRIN MIGRAINE) [phone] MG per tablet Take 2 tablets by mouth every 6 (six) hours as needed for headache.   Yes Historical Provider, MD  levonorgestrel (PLAN B,NEXT CHOICE) 0.75 MG tablet Take 0.75 mg by mouth every 12 (twelve) hours.   Yes Historical Provider, MD   BP 109/56  Pulse 58  Temp(Src) 98 F (36.7 C) (Oral)  Resp 18  Ht 5\' 6"  (1.676 m)  Wt 170 lb (77.111 kg)  BMI 27.45 kg/m2  SpO2 100%  LMP 11/16/2013 Vitals reviewed Physical Exam Physical Examination: General appearance - alert, uncomfortable appearing, and in no distress Mental status - alert, oriented to person, place, and time Eyes - no conjunctival injection, no scleral icterus Mouth - mucous membranes moist, pharynx normal without lesions Chest - clear to auscultation, no wheezes, rales or rhonchi, symmetric air entry Heart - normal rate, regular rhythm, normal S1, S2, no murmurs, rubs, clicks or gallops Abdomen - soft, ttp diffusely, most in right lower abdomen and suprapubic region, nondistended, no masses or organomegaly Extremities - peripheral pulses normal, no pedal edema, no clubbing or cyanosis Skin - normal coloration and turgor, no rashes  ED Course  Procedures (including critical care time)  3:23 PM on recheck patient now has tenderness at mcburney's point discretely.  She states she feels pain if she moves in  the stretcher.  All labs and imaging studies reviewed.  No definite appendicitis on CT scan, but appendix partially visualized.  Will talk with surgery as I am concerned about possible appendicitis.    3:31 PM d/w Dr. Donell Beers, surgery, at this point she does recommend repeating the scan with IV/PO contrast.  She feels if the appendix is well visualized with IV/PO contrast and is normal, then appendicitis would be highly unlikely.  She will let on call surgeon know about patient as well.  Labs Review Labs Reviewed  WET PREP, GENITAL - Abnormal; Notable  for the following:    Clue Cells Wet Prep HPF POC MODERATE (*)    WBC, Wet Prep HPF POC MANY (*)    All other components within normal limits  CBC - Abnormal; Notable for the following:    WBC 20.7 (*)    All other components within normal limits  COMPREHENSIVE METABOLIC PANEL - Abnormal; Notable for the following:    Glucose, Bld 117 (*)    All other components within normal limits  URINALYSIS, ROUTINE W REFLEX MICROSCOPIC - Abnormal; Notable for the following:    Color, Urine ORANGE (*)    APPearance CLOUDY (*)    Specific Gravity, Urine 1.038 (*)    Bilirubin Urine SMALL (*)    Ketones, ur 15 (*)    Protein, ur 100 (*)    Leukocytes, UA MODERATE (*)    All other components within normal limits  URINE MICROSCOPIC-ADD ON - Abnormal; Notable for the following:    Squamous Epithelial / LPF FEW (*)    Bacteria, UA FEW (*)    All other components within normal limits  CULTURE, BLOOD (ROUTINE X 2)  CULTURE, BLOOD (ROUTINE X 2)  GC/CHLAMYDIA PROBE AMP  LIPASE, BLOOD  CBC  I-STAT CG4 LACTIC ACID, ED    Imaging Review Ct Abdomen Pelvis Wo Contrast  11/24/2013   CLINICAL DATA:  Right lower quadrant pain, nausea, vomiting  EXAM: CT ABDOMEN AND PELVIS WITHOUT CONTRAST  TECHNIQUE: Multidetector CT imaging of the abdomen and pelvis was performed following the standard protocol without IV contrast.  COMPARISON:  None.  FINDINGS: The study is limited without IV and oral contrast.  Lung bases are unremarkable.  Sagittal images of the spine are unremarkable.  Unenhanced liver is unremarkable. Unenhanced pancreas and adrenal glands are unremarkable. There is poorly visualized low-density lesion in the splenic dome measures 3 cm. This cannot be characterized without IV contrast. No calcified gallstones are noted within gallbladder. Unenhanced kidneys are symmetrical in size. No nephrolithiasis. No aortic aneurysm. No hydronephrosis or hydroureter. No calcified ureteral calculi are noted.  There is a  low lying cecum. The tip of the cecum is in right anterior pelvis with mild mass effect on the urinary bladder. No pericecal inflammation. Normal appendix is partially visualized in coronal image 47. Unenhanced uterus and adnexa are unremarkable. Small amount of pelvic free fluid noted posterior cul-de-sac. No distal colonic obstruction. No inguinal adenopathy. No destructive bony lesions are noted within pelvis. No small bowel obstruction. No adenopathy.  IMPRESSION: 1. Limited study without IV or oral contrast. No nephrolithiasis. No hydronephrosis or hydroureter. No calcified ureteral calculi. 2. There is a low lying cecum. No pericecal inflammation. Normal appendix partially visualized. 3. Poorly visualized low-density lesion within splenic dome measures about 3 cm. This cannot be characterized without IV contrast. 4. No small bowel or colonic obstruction. Small amount of pelvic free fluid within posterior cul-de-sac.   Electronically Signed  By: Natasha MeadLiviu  Pop M.D.   On: 11/24/2013 12:58   [place] Transvaginal Non-ob  11/24/2013   CLINICAL DATA:  Right lower quadrant pain.  Nausea and vomiting.  EXAM: TRANSABDOMINAL AND TRANSVAGINAL ULTRASOUND OF PELVIS  DOPPLER ULTRASOUND OF OVARIES  TECHNIQUE: Both transabdominal and transvaginal ultrasound examinations of the pelvis were performed. Transabdominal technique was performed for global imaging of the pelvis including uterus, ovaries, adnexal regions, and pelvic cul-de-sac.  It was necessary to proceed with endovaginal exam following the transabdominal exam to visualize the uterus. Color and duplex Doppler ultrasound was utilized to evaluate blood flow to the ovaries.  COMPARISON:  CT scan from the same day.  FINDINGS: Uterus  Measurements: 7.0 x 4.0 x 4.9 cm, within normal limits. No fibroids or other mass visualized.  Endometrium  Thickness: 7 mm, within normal limit. No focal abnormality visualized.  Right ovary  Measurements: 4.0 x 2.2 x 2.5 cm, within normal  limits. Normal appearance/no adnexal mass.  Left ovary  Measurements: 4.6 x 2.3 x 2.6 cm, within normal limits. Normal appearance/no adnexal mass.  Pulsed Doppler evaluation of both ovaries demonstrates normal low-resistance arterial and venous waveforms.  Other findings  A small amount of free fluid is present, likely physiologic  IMPRESSION: 1. May small and free fluid is likely physiologic. 2. Normal appearance of the uterus and ovaries. 3. Normal color Doppler signal and waveforms.   Electronically Signed   By: Gennette Pachris  Mattern M.D.   On: 11/24/2013 14:11   [place] Pelvis Complete  11/24/2013   CLINICAL DATA:  Right lower quadrant pain.  Nausea and vomiting.  EXAM: TRANSABDOMINAL AND TRANSVAGINAL ULTRASOUND OF PELVIS  DOPPLER ULTRASOUND OF OVARIES  TECHNIQUE: Both transabdominal and transvaginal ultrasound examinations of the pelvis were performed. Transabdominal technique was performed for global imaging of the pelvis including uterus, ovaries, adnexal regions, and pelvic cul-de-sac.  It was necessary to proceed with endovaginal exam following the transabdominal exam to visualize the uterus. Color and duplex Doppler ultrasound was utilized to evaluate blood flow to the ovaries.  COMPARISON:  CT scan from the same day.  FINDINGS: Uterus  Measurements: 7.0 x 4.0 x 4.9 cm, within normal limits. No fibroids or other mass visualized.  Endometrium  Thickness: 7 mm, within normal limit. No focal abnormality visualized.  Right ovary  Measurements: 4.0 x 2.2 x 2.5 cm, within normal limits. Normal appearance/no adnexal mass.  Left ovary  Measurements: 4.6 x 2.3 x 2.6 cm, within normal limits. Normal appearance/no adnexal mass.  Pulsed Doppler evaluation of both ovaries demonstrates normal low-resistance arterial and venous waveforms.  Other findings  A small amount of free fluid is present, likely physiologic  IMPRESSION: 1. May small and free fluid is likely physiologic. 2. Normal appearance of the uterus and ovaries. 3.  Normal color Doppler signal and waveforms.   Electronically Signed   By: Gennette Pachris  Mattern M.D.   On: 11/24/2013 14:11   Ct Abdomen Pelvis W Contrast  11/24/2013   CLINICAL DATA:  Right lower quadrant pain. Elevated white blood cell count.  EXAM: CT ABDOMEN AND PELVIS WITH CONTRAST  TECHNIQUE: Multidetector CT imaging of the abdomen and pelvis was performed using the standard protocol following bolus administration of intravenous contrast.  CONTRAST:  50mL OMNIPAQUE IOHEXOL 300 MG/ML SOLN, 100mL OMNIPAQUE IOHEXOL 300 MG/ML SOLN  FINDINGS: No focal hepatic abnormality. A 2.6 cm low-density lesion is noted in the spleen. This could represent developing splenic abscess. Splenic infarction and splenic tumor could present in this fashion. Pancreas normal.  No biliary distention. The gallbladder is nondistended.  Adrenals normal. Kidneys normal. No hydronephrosis or obstructing ureteral stone. The bladder is nondistended. Bilateral adnexal fullness with what appears to be prominence of the fallopian tubes noted. Pelvic inflammatory disease could present in this fashion. This should be considered particularly given the patient's history of pain and elevated white count. Small amount of free pelvic fluid.  Multiple lymph nodes are noted in the inguinal region, largest measures 1.4 cm. Shotty retroperitoneal lymph nodes present. Abdominal aorta is widely patent. Visceral vessels are patent. Portal vein and splenic vein patent.  The appendix is difficult to visualize. Mild small and large bowel prominence suggesting adynamic ileus. Stool is noted throughout the colon. No mesenteric mass. No hernia.  Heart size normal. Lung bases are clear. No acute bony abnormality identified.  IMPRESSION: 1. Free pelvic fluid. Mild bilateral adnexal fullness. Fallopian tubes appeared prominent. Pelvic inflammatory disease could present in this fashion. This should be considered given the patient's history of pain and elevated white blood cell  count. 2. 2.6 cm low-density lesion in the spleen. Given patient's history, splenic abscess could present in this fashion. Focal splenic infarct or splenic tumor could present in this fashion. 3. Prominent bilateral inguinal lymph nodes. 4. Adynamic ileus.   Electronically Signed   By: Maisie Fus  Register   On: 11/24/2013 17:44   [place] Art/ven Flow Abd Pelv Doppler  11/24/2013   CLINICAL DATA:  Right lower quadrant pain.  Nausea and vomiting.  EXAM: TRANSABDOMINAL AND TRANSVAGINAL ULTRASOUND OF PELVIS  DOPPLER ULTRASOUND OF OVARIES  TECHNIQUE: Both transabdominal and transvaginal ultrasound examinations of the pelvis were performed. Transabdominal technique was performed for global imaging of the pelvis including uterus, ovaries, adnexal regions, and pelvic cul-de-sac.  It was necessary to proceed with endovaginal exam following the transabdominal exam to visualize the uterus. Color and duplex Doppler ultrasound was utilized to evaluate blood flow to the ovaries.  COMPARISON:  CT scan from the same day.  FINDINGS: Uterus  Measurements: 7.0 x 4.0 x 4.9 cm, within normal limits. No fibroids or other mass visualized.  Endometrium  Thickness: 7 mm, within normal limit. No focal abnormality visualized.  Right ovary  Measurements: 4.0 x 2.2 x 2.5 cm, within normal limits. Normal appearance/no adnexal mass.  Left ovary  Measurements: 4.6 x 2.3 x 2.6 cm, within normal limits. Normal appearance/no adnexal mass.  Pulsed Doppler evaluation of both ovaries demonstrates normal low-resistance arterial and venous waveforms.  Other findings  A small amount of free fluid is present, likely physiologic  IMPRESSION: 1. May small and free fluid is likely physiologic. 2. Normal appearance of the uterus and ovaries. 3. Normal color Doppler signal and waveforms.   Electronically Signed   By: Gennette Pac M.D.   On: 11/24/2013 14:11     EKG Interpretation None      MDM   Final diagnoses:  Right lower quadrant abdominal pain     Pt presenting with c/o abdominal pain, during observation in the ED and workup- pain seems to have localized more the right lower abdomen.  Initial noncontrast CT scan shows no stone, appendix is partially visualized and normal.  Pelvic ultrasound normal as well.  Pt has some findings of UTI and was started on rocephin in the ED. Pt was initially hypotensive, but this has improved after IV fluids, lactate reassuring.  WBC is elevated at 20K.  On re-exam pain and tenderness is at Mcburney's point.  I have d/w Dr. Donell Beers, surgery and she  recommends IV/PO contrast.  This has been ordered. Pt discussed with Dr. Gwendolyn Grant and he will followup on scans and dispo.  I have discussed all results with patient and family at bedside.      Ethelda Chick, MD 11/25/13 [phone]

## 2013-11-24 NOTE — ED Provider Notes (Signed)
1600 - care from Dr. Karma GanjaLinker. 23 year old female here with acute onset of abdominal pain. Diffuse with concentrations in the right lower quadrant being worse. Denies any vaginal bleeding or discharge. Had a CT scan without contrast that was negative for appendicitis, however the appendix is not well-visualized. She had an ultrasound of her pelvis showed some free fluid in her abdomen but no other concerning findings. Dr. Karma GanjaLinker spoke with Dr. Donell BeersByerly of surgery who recommended IV contrasted scan as patient had persistent right lower quadrant pain worrisome for appendicitis. Labs showed leukocytosis of 20.7. Her blood pressures were initially in the 80s and had improved with fluids. She received Rocephin for UTI. Patient also reported recent STD workup that was negative Contrasted scan showed fullness of the fallopian tubes and free fluid on her adnexa. This is concerning for PID. Will do pelvic exam and send swab for GC chlamydia and a wet prep. There is possible splenic abscess. No appendicitis on the second scan. I spoke with Dr. Michaell CowingGross at surgery fairly extensively about the skin. He reported with a splenic abscess less than 3 cm, antibiotics are indicated. No need for surgical exploration. CT scan was also not definitive for abscess. This can be followed up with further imaging at a later date as long as she is improving.  Patient still with pain, nausea. Will initial hypotension, [facility] concern of P[facility]. Dr. Debroah LoopArnold accepting.  1. Right lower quadrant abdominal pain      Dagmar HaitWilliam Carlyn Lemke, MD 11/24/13 Ernestina [id number]

## 2013-11-24 NOTE — ED Notes (Signed)
Returned from U/S

## 2013-11-24 NOTE — ED Provider Notes (Signed)
Of note, on my pelvic exam, purulent discharge from the cervix, diffuse tenderness consistent with PID.  1. Right lower quadrant abdominal pain      Dagmar HaitWilliam Semaj Kham, MD 11/24/13 [phone]

## 2013-11-24 NOTE — Telephone Encounter (Signed)
Patient Information:  Caller Name: Selena BattenKim  Phone: [phone]  Patient: Nadene RubinsMcClintock, Nethra E  Gender: Female  DOB: 1990-06-04  Age: 23 Years  PCP: Danise EdgeBlyth, [facility](Family Practice)  Pregnant: No  Office Follow Up:  Does the office need to follow up with this patient?: No  Instructions For The Office: N/A  RN Note:  Mom calling regarding child/Mykayla who awoke this AM with body aches, headache, and nausea.  Symptoms  Reason For Call & Symptoms: body aches, stomach cramps, nausea  Reviewed Health History In EMR: Yes  Reviewed Medications In EMR: Yes  Reviewed Allergies In EMR: Yes  Reviewed Surgeries / Procedures: Yes  Date of Onset of Symptoms: 11/24/2013  Treatments Tried: Ibuprofen  Treatments Tried Worked: No OB / GYN:  LMP: 11/17/2013  Guideline(s) Used:  Abdominal Pain - Female  Influenza - Seasonal  Disposition Per Guideline:   Go to ED Now  Reason For Disposition Reached:   Headache and stiff neck (can't touch chin to chest)  Advice Given:  N/A  RN Overrode Recommendation:  Make Appointment  Appointment at office today  Appointment Scheduled:  11/24/2013 09:00:00 Appointment Scheduled Provider:  Maximino SarinWeaver, Layne

## 2013-11-24 NOTE — Progress Notes (Signed)
Pre visit review using our clinic review tool, if applicable. No additional management support is needed unless otherwise documented below in the visit note. 

## 2013-11-24 NOTE — H&P (Signed)
Cindy Jones is an 23 y.o. female. G1P0010 Patient's last menstrual period was 11/16/2013.' Using no contraception, woke with acute RLQ pain this morning 0100. C/O chills and nausea and emesis. She presented to [facility] and evaluated for possible appendicitis, which was not identified on CT scan. Dx was made of PID and transferred to [facility].   Pertinent Gynecological History: Menses: regular every month without intermenstrual spotting Contraception: none DES exposure:   Blood transfusions: none Sexually transmitted diseases: no past history Previous GYN Procedures: no   OB History: G1, P0, "septic pregnancy" she was told 8 weeks   Menstrual History:  Patient's last menstrual period was 11/16/2013.    Past Medical History  Diagnosis Date  . Frequent headaches   . Migraines   . Urinary tract bacterial infections   . Tourette disorder   . Binge eating disorder     Past Surgical History  Procedure Laterality Date  . Tonsillectomy and adenoidectomy  2013    Family History  Problem Relation Age of Onset  . Alcohol abuse Mother   . Cancer Father     sarcoma  . Tourette syndrome Maternal Aunt   . Hyperlipidemia Paternal Aunt   . Tourette syndrome Maternal Grandfather   . Heart disease Paternal Grandfather     Social History:  reports that she has never smoked. She has never used smokeless tobacco. She reports that she drinks about 1.8 ounces of alcohol per week. She reports that she does not use illicit drugs.  Allergies:  Allergies  Allergen Reactions  . Sulfa Antibiotics Hives and Swelling    Prescriptions prior to admission  Medication Sig Dispense Refill  . amphetamine-dextroamphetamine (ADDERALL XR) 20 MG 24 hr capsule Take 1 capsule (20 mg total) by mouth daily.  30 capsule  0    Review of Systems  Constitutional: Positive for fever (at home) and chills (at home).  Gastrointestinal: Positive for nausea and vomiting. Abdominal pain:  RLQ> left.  Genitourinary: Positive for dysuria.       No discharge, bleeding  Psychiatric/Behavioral: Negative.     Blood pressure 116/73, pulse 54, temperature 98.4 F (36.9 C), temperature source Oral, resp. rate 18, height 5\' 6"  (1.676 m), weight 170 lb (77.111 kg), last menstrual period 11/16/2013, SpO2 100.00%. Physical Exam  Constitutional: She is oriented to person, place, and time. She appears well-developed. She appears distressed (moderate discomfort).  HENT:  Head: Normocephalic.  Eyes: Pupils are equal, round, and reactive to light.  Neck: Normal range of motion.  Cardiovascular: Normal rate and normal heart sounds.   Respiratory: Effort normal and breath sounds normal.  GI: Soft. She exhibits no mass. There is tenderness. There is guarding (RLQ).  Genitourinary:  deferred  Musculoskeletal: She exhibits no edema and no tenderness.  Neurological: She is alert and oriented to person, place, and time.  Skin: Skin is warm and dry.  Psychiatric: She has a normal mood and affect. Her behavior is normal.    Results for orders placed during the hospital encounter of 11/24/13 (from the past 24 hour(s))  CBC     Status: Abnormal   Collection Time    11/24/13 11:55 AM      Result Value Ref Range   WBC 20.7 (*) 4.0 - 10.5 K/uL   RBC 4.04  3.87 - 5.11 MIL/uL   Hemoglobin 12.2  12.0 - 15.0 g/dL   HCT 95.6  21.3 - 08.6 %   MCV 90.3  78.0 - 100.0 fL  MCH 30.2  26.0 - 34.0 pg   MCHC 33.4  30.0 - 36.0 g/dL   RDW 16.1  09.6 - 04.5 %   Platelets 154  150 - 400 K/uL  COMPREHENSIVE METABOLIC PANEL     Status: Abnormal   Collection Time    11/24/13 11:55 AM      Result Value Ref Range   Sodium 139  137 - 147 mEq/L   Potassium 4.1  3.7 - 5.3 mEq/L   Chloride 101  96 - 112 mEq/L   CO2 25  19 - 32 mEq/L   Glucose, Bld 117 (*) 70 - 99 mg/dL   BUN 9  6 - 23 mg/dL   Creatinine, Ser 4.09  0.50 - 1.10 mg/dL   Calcium 9.3  8.4 - 81.1 mg/dL   Total Protein 7.3  6.0 - 8.3 g/dL    Albumin 4.3  3.5 - 5.2 g/dL   AST 19  0 - 37 U/L   ALT 14  0 - 35 U/L   Alkaline Phosphatase 51  39 - 117 U/L   Total Bilirubin 1.0  0.3 - 1.2 mg/dL   GFR calc non Af Amer >90  >90 mL/min   GFR calc Af Amer >90  >90 mL/min   Anion gap 13  5 - 15  LIPASE, BLOOD     Status: None   Collection Time    11/24/13 11:55 AM      Result Value Ref Range   Lipase 16  11 - 59 U/L  URINALYSIS, ROUTINE W REFLEX MICROSCOPIC     Status: Abnormal   Collection Time    11/24/13  1:20 PM      Result Value Ref Range   Color, Urine ORANGE (*) YELLOW   APPearance CLOUDY (*) CLEAR   Specific Gravity, Urine 1.038 (*) 1.005 - 1.030   pH 6.0  5.0 - 8.0   Glucose, UA NEGATIVE  NEGATIVE mg/dL   Hgb urine dipstick NEGATIVE  NEGATIVE   Bilirubin Urine SMALL (*) NEGATIVE   Ketones, ur 15 (*) NEGATIVE mg/dL   Protein, ur 914 (*) NEGATIVE mg/dL   Urobilinogen, UA 1.0  0.0 - 1.0 mg/dL   Nitrite NEGATIVE  NEGATIVE   Leukocytes, UA MODERATE (*) NEGATIVE  URINE MICROSCOPIC-ADD ON     Status: Abnormal   Collection Time    11/24/13  1:20 PM      Result Value Ref Range   Squamous Epithelial / LPF FEW (*) RARE   WBC, UA 7-10  <3 WBC/hpf   RBC / HPF 0-2  <3 RBC/hpf   Bacteria, UA FEW (*) RARE   Urine-Other MUCOUS PRESENT    I-STAT CG4 LACTIC ACID, ED     Status: None   Collection Time    11/24/13  2:12 PM      Result Value Ref Range   Lactic Acid, Venous 0.55  0.5 - 2.2 mmol/L  WET PREP, GENITAL     Status: Abnormal   Collection Time    11/24/13  7:41 PM      Result Value Ref Range   Yeast Wet Prep HPF POC NONE SEEN  NONE SEEN   Trich, Wet Prep NONE SEEN  NONE SEEN   Clue Cells Wet Prep HPF POC MODERATE (*) NONE SEEN   WBC, Wet Prep HPF POC MANY (*) NONE SEEN    Ct Abdomen Pelvis Wo Contrast  11/24/2013   CLINICAL DATA:  Right lower quadrant pain, nausea, vomiting  EXAM: CT ABDOMEN  AND PELVIS WITHOUT CONTRAST  TECHNIQUE: Multidetector CT imaging of the abdomen and pelvis was performed following the  standard protocol without IV contrast.  COMPARISON:  None.  FINDINGS: The study is limited without IV and oral contrast.  Lung bases are unremarkable.  Sagittal images of the spine are unremarkable.  Unenhanced liver is unremarkable. Unenhanced pancreas and adrenal glands are unremarkable. There is poorly visualized low-density lesion in the splenic dome measures 3 cm. This cannot be characterized without IV contrast. No calcified gallstones are noted within gallbladder. Unenhanced kidneys are symmetrical in size. No nephrolithiasis. No aortic aneurysm. No hydronephrosis or hydroureter. No calcified ureteral calculi are noted.  There is a low lying cecum. The tip of the cecum is in right anterior pelvis with mild mass effect on the urinary bladder. No pericecal inflammation. Normal appendix is partially visualized in coronal image 47. Unenhanced uterus and adnexa are unremarkable. Small amount of pelvic free fluid noted posterior cul-de-sac. No distal colonic obstruction. No inguinal adenopathy. No destructive bony lesions are noted within pelvis. No small bowel obstruction. No adenopathy.  IMPRESSION: 1. Limited study without IV or oral contrast. No nephrolithiasis. No hydronephrosis or hydroureter. No calcified ureteral calculi. 2. There is a low lying cecum. No pericecal inflammation. Normal appendix partially visualized. 3. Poorly visualized low-density lesion within splenic dome measures about 3 cm. This cannot be characterized without IV contrast. 4. No small bowel or colonic obstruction. Small amount of pelvic free fluid within posterior cul-de-sac.   Electronically Signed   By: Natasha Mead M.D.   On: 11/24/2013 12:58   US Transvaginal Non-ob  11/24/2013   CLINICAL DATA:  Right lower quadrant pain.  Nausea and vomiting.  EXAM: TRANSABDOMINAL AND TRANSVAGINAL ULTRASOUND OF PELVIS  DOPPLER ULTRASOUND OF OVARIES  TECHNIQUE: Both transabdominal and transvaginal ultrasound examinations of the pelvis were  performed. Transabdominal technique was performed for global imaging of the pelvis including uterus, ovaries, adnexal regions, and pelvic cul-de-sac.  It was necessary to proceed with endovaginal exam following the transabdominal exam to visualize the uterus. Color and duplex Doppler ultrasound was utilized to evaluate blood flow to the ovaries.  COMPARISON:  CT scan from the same day.  FINDINGS: Uterus  Measurements: 7.0 x 4.0 x 4.9 cm, within normal limits. No fibroids or other mass visualized.  Endometrium  Thickness: 7 mm, within normal limit. No focal abnormality visualized.  Right ovary  Measurements: 4.0 x 2.2 x 2.5 cm, within normal limits. Normal appearance/no adnexal mass.  Left ovary  Measurements: 4.6 x 2.3 x 2.6 cm, within normal limits. Normal appearance/no adnexal mass.  Pulsed Doppler evaluation of both ovaries demonstrates normal low-resistance arterial and venous waveforms.  Other findings  A small amount of free fluid is present, likely physiologic  IMPRESSION: 1. May small and free fluid is likely physiologic. 2. Normal appearance of the uterus and ovaries. 3. Normal color Doppler signal and waveforms.   Electronically Signed   By: Gennette Pac M.D.   On: 11/24/2013 14:11   US Pelvis Complete  11/24/2013   CLINICAL DATA:  Right lower quadrant pain.  Nausea and vomiting.  EXAM: TRANSABDOMINAL AND TRANSVAGINAL ULTRASOUND OF PELVIS  DOPPLER ULTRASOUND OF OVARIES  TECHNIQUE: Both transabdominal and transvaginal ultrasound examinations of the pelvis were performed. Transabdominal technique was performed for global imaging of the pelvis including uterus, ovaries, adnexal regions, and pelvic cul-de-sac.  It was necessary to proceed with endovaginal exam following the transabdominal exam to visualize the uterus. Color and duplex Doppler ultrasound  was utilized to evaluate blood flow to the ovaries.  COMPARISON:  CT scan from the same day.  FINDINGS: Uterus  Measurements: 7.0 x 4.0 x 4.9 cm, within  normal limits. No fibroids or other mass visualized.  Endometrium  Thickness: 7 mm, within normal limit. No focal abnormality visualized.  Right ovary  Measurements: 4.0 x 2.2 x 2.5 cm, within normal limits. Normal appearance/no adnexal mass.  Left ovary  Measurements: 4.6 x 2.3 x 2.6 cm, within normal limits. Normal appearance/no adnexal mass.  Pulsed Doppler evaluation of both ovaries demonstrates normal low-resistance arterial and venous waveforms.  Other findings  A small amount of free fluid is present, likely physiologic  IMPRESSION: 1. May small and free fluid is likely physiologic. 2. Normal appearance of the uterus and ovaries. 3. Normal color Doppler signal and waveforms.   Electronically Signed   By: Gennette Pachris  Mattern M.D.   On: 11/24/2013 14:11   Ct Abdomen Pelvis W Contrast  11/24/2013   CLINICAL DATA:  Right lower quadrant pain. Elevated white blood cell count.  EXAM: CT ABDOMEN AND PELVIS WITH CONTRAST  TECHNIQUE: Multidetector CT imaging of the abdomen and pelvis was performed using the standard protocol following bolus administration of intravenous contrast.  CONTRAST:  50mL OMNIPAQUE IOHEXOL 300 MG/ML SOLN, 100mL OMNIPAQUE IOHEXOL 300 MG/ML SOLN  FINDINGS: No focal hepatic abnormality. A 2.6 cm low-density lesion is noted in the spleen. This could represent developing splenic abscess. Splenic infarction and splenic tumor could present in this fashion. Pancreas normal. No biliary distention. The gallbladder is nondistended.  Adrenals normal. Kidneys normal. No hydronephrosis or obstructing ureteral stone. The bladder is nondistended. Bilateral adnexal fullness with what appears to be prominence of the fallopian tubes noted. Pelvic inflammatory disease could present in this fashion. This should be considered particularly given the patient's history of pain and elevated white count. Small amount of free pelvic fluid.  Multiple lymph nodes are noted in the inguinal region, largest measures 1.4 cm.  Shotty retroperitoneal lymph nodes present. Abdominal aorta is widely patent. Visceral vessels are patent. Portal vein and splenic vein patent.  The appendix is difficult to visualize. Mild small and large bowel prominence suggesting adynamic ileus. Stool is noted throughout the colon. No mesenteric mass. No hernia.  Heart size normal. Lung bases are clear. No acute bony abnormality identified.  IMPRESSION: 1. Free pelvic fluid. Mild bilateral adnexal fullness. Fallopian tubes appeared prominent. Pelvic inflammatory disease could present in this fashion. This should be considered given the patient's history of pain and elevated white blood cell count. 2. 2.6 cm low-density lesion in the spleen. Given patient's history, splenic abscess could present in this fashion. Focal splenic infarct or splenic tumor could present in this fashion. 3. Prominent bilateral inguinal lymph nodes. 4. Adynamic ileus.   Electronically Signed   By: Maisie Fushomas  Register   On: 11/24/2013 17:44   [place] Art/ven Flow Abd Pelv Doppler  11/24/2013   CLINICAL DATA:  Right lower quadrant pain.  Nausea and vomiting.  EXAM: TRANSABDOMINAL AND TRANSVAGINAL ULTRASOUND OF PELVIS  DOPPLER ULTRASOUND OF OVARIES  TECHNIQUE: Both transabdominal and transvaginal ultrasound examinations of the pelvis were performed. Transabdominal technique was performed for global imaging of the pelvis including uterus, ovaries, adnexal regions, and pelvic cul-de-sac.  It was necessary to proceed with endovaginal exam following the transabdominal exam to visualize the uterus. Color and duplex Doppler ultrasound was utilized to evaluate blood flow to the ovaries.  COMPARISON:  CT scan from the same day.  FINDINGS:  Uterus  Measurements: 7.0 x 4.0 x 4.9 cm, within normal limits. No fibroids or other mass visualized.  Endometrium  Thickness: 7 mm, within normal limit. No focal abnormality visualized.  Right ovary  Measurements: 4.0 x 2.2 x 2.5 cm, within normal limits. Normal  appearance/no adnexal mass.  Left ovary  Measurements: 4.6 x 2.3 x 2.6 cm, within normal limits. Normal appearance/no adnexal mass.  Pulsed Doppler evaluation of both ovaries demonstrates normal low-resistance arterial and venous waveforms.  Other findings  A small amount of free fluid is present, likely physiologic  IMPRESSION: 1. May small and free fluid is likely physiologic. 2. Normal appearance of the uterus and ovaries. 3. Normal color Doppler signal and waveforms.   Electronically Signed   By: Gennette Pac M.D.   On: 11/24/2013 14:11    Assessment/Plan: Acute PID, continue doxycycline, change to cefoxitin and discontinue rocephin  Leylani Duley 11/24/2013, 9:17 PM

## 2013-11-25 LAB — CBC
HCT: 31.8 % — ABNORMAL LOW (ref 36.0–46.0)
Hemoglobin: 10.5 g/dL — ABNORMAL LOW (ref 12.0–15.0)
MCH: 30.1 pg (ref 26.0–34.0)
MCHC: 33 g/dL (ref 30.0–36.0)
MCV: 91.1 fL (ref 78.0–100.0)
Platelets: 113 10*3/uL — ABNORMAL LOW (ref 150–400)
RBC: 3.49 MIL/uL — ABNORMAL LOW (ref 3.87–5.11)
RDW: 13.6 % (ref 11.5–15.5)
WBC: 5.8 10*3/uL (ref 4.0–10.5)

## 2013-11-25 LAB — GC/CHLAMYDIA PROBE AMP
CT Probe RNA: NEGATIVE
GC Probe RNA: NEGATIVE

## 2013-11-25 MED ORDER — LACTATED RINGERS IV SOLN
INTRAVENOUS | Status: DC
  Administered 2013-11-25 – 2013-11-26 (×3): via INTRAVENOUS

## 2013-11-25 NOTE — Progress Notes (Signed)
Subjective: still with pain, no fever, appetite improved Patient reports tolerating PO and no problems voiding.    Objective: I have reviewed patient's vital signs, intake and output, medications and labs.  General: alert, cooperative and mild distress GI: abnormal findings:  mild tenderness in the RLQ and in the lower abdomen  Intake/Output Summary (Last 24 hours) at 11/25/13 0658 Last data filed at 11/25/13 0546  Gross per 24 hour  Intake   3000 ml  Output    175 ml  Net   2825 ml     Assessment/Plan: PID, poor appetite, still needs better hydration IV and PO, continue antibiotic, repeat CBC   LOS: 1 day    Shareeka Yim 11/25/2013, 6:56 AM

## 2013-11-25 NOTE — Progress Notes (Signed)
UR completed 

## 2013-11-26 DIAGNOSIS — R1031 Right lower quadrant pain: Secondary | ICD-10-CM

## 2013-11-26 DIAGNOSIS — N730 Acute parametritis and pelvic cellulitis: Principal | ICD-10-CM

## 2013-11-26 LAB — URINE CULTURE: Colony Count: 50000

## 2013-11-26 MED ORDER — PROMETHAZINE HCL 25 MG PO TABS: 25.0000 mg | ORAL_TABLET | Freq: Four times a day (QID) | ORAL | Status: DC | PRN

## 2013-11-26 MED ORDER — METRONIDAZOLE 500 MG PO TABS: 500.0000 mg | ORAL_TABLET | Freq: Two times a day (BID) | ORAL | Status: AC

## 2013-11-26 MED ORDER — OXYCODONE-ACETAMINOPHEN 5-325 MG PO TABS: 1.0000 | ORAL_TABLET | ORAL | Status: DC | PRN

## 2013-11-26 MED ORDER — ONDANSETRON HCL 4 MG PO TABS: 4.0000 mg | ORAL_TABLET | Freq: Four times a day (QID) | ORAL | Status: DC | PRN

## 2013-11-26 MED ORDER — IBUPROFEN 600 MG PO TABS: 600.0000 mg | ORAL_TABLET | Freq: Four times a day (QID) | ORAL | Status: DC | PRN

## 2013-11-26 MED ORDER — DOCUSATE SODIUM 100 MG PO CAPS
100.0000 mg | ORAL_CAPSULE | Freq: Two times a day (BID) | ORAL | Status: DC | PRN
  Administered 2013-11-26: 100 mg via ORAL
  Filled 2013-11-26: qty 1

## 2013-11-26 MED ORDER — DOXYCYCLINE HYCLATE 50 MG PO CAPS: 50.0000 mg | ORAL_CAPSULE | Freq: Two times a day (BID) | ORAL | Status: DC

## 2013-11-26 MED ORDER — PROMETHAZINE HCL 25 MG/ML IJ SOLN
12.5000 mg | Freq: Once | INTRAMUSCULAR | Status: AC
  Administered 2013-11-26: 12.5 mg via INTRAVENOUS
  Filled 2013-11-26: qty 1

## 2013-11-26 NOTE — Discharge Summary (Signed)
Physician Discharge Summary  Patient ID: Cindy Jones MRN: [id number] DOB/AGE: 1990-10-03 23 y.o.  Admit date: 11/24/2013 Discharge date: 11/26/2013  Active Problems:   PID (acute pelvic inflammatory disease)  Discharged Condition:  Stable  Hospital Course:  Patient admitted for RLQ and lower abdominal pain. Appendicitis was ruled out on imaging. She had subjective fevers (no objective fevers noted), WBC of 20.  She was admitted with presumed PID and started on antibiotics (Cefotetan and Doxycyline) and given analgesics as needed.  Her pain improved, WBC decreased to 5.8 the following day.  GC/Chlam cultures were negative, but wet prep showed moderate clue cells concerning for BV treated with Metronidazole. Negative urine culture.  On 11/26/2013, she was deemed stable for discharge to home with outpatient follow up. Of note, she reports her PCP will refer her to a gynecologist for follow up.  Consults: None  Significant Diagnostic Studies: Results for orders placed during the hospital encounter of 11/24/13 (from the past 168 hour(s))  CBC   Collection Time    11/24/13 11:55 AM      Result Value Ref Range   WBC 20.7 (*) 4.0 - 10.5 K/uL   RBC 4.04  3.87 - 5.11 MIL/uL   Hemoglobin 12.2  12.0 - 15.0 g/dL   HCT 47.8  29.5 - 62.1 %   MCV 90.3  78.0 - 100.0 fL   MCH 30.2  26.0 - 34.0 pg   MCHC 33.4  30.0 - 36.0 g/dL   RDW 30.8  65.7 - 84.6 %   Platelets 154  150 - 400 K/uL  COMPREHENSIVE METABOLIC PANEL   Collection Time    11/24/13 11:55 AM      Result Value Ref Range   Sodium 139  137 - 147 mEq/L   Potassium 4.1  3.7 - 5.3 mEq/L   Chloride 101  96 - 112 mEq/L   CO2 25  19 - 32 mEq/L   Glucose, Bld 117 (*) 70 - 99 mg/dL   BUN 9  6 - 23 mg/dL   Creatinine, Ser 9.62  0.50 - 1.10 mg/dL   Calcium 9.3  8.4 - 95.2 mg/dL   Total Protein 7.3  6.0 - 8.3 g/dL   Albumin 4.3  3.5 - 5.2 g/dL   AST 19  0 - 37 U/L   ALT 14  0 - 35 U/L   Alkaline Phosphatase 51  39 - 117 U/L   Total  Bilirubin 1.0  0.3 - 1.2 mg/dL   GFR calc non Af Amer >90  >90 mL/min   GFR calc Af Amer >90  >90 mL/min   Anion gap 13  5 - 15  LIPASE, BLOOD   Collection Time    11/24/13 11:55 AM      Result Value Ref Range   Lipase 16  11 - 59 U/L  URINALYSIS, ROUTINE W REFLEX MICROSCOPIC   Collection Time    11/24/13  1:20 PM      Result Value Ref Range   Color, Urine ORANGE (*) YELLOW   APPearance CLOUDY (*) CLEAR   Specific Gravity, Urine 1.038 (*) 1.005 - 1.030   pH 6.0  5.0 - 8.0   Glucose, UA NEGATIVE  NEGATIVE mg/dL   Hgb urine dipstick NEGATIVE  NEGATIVE   Bilirubin Urine SMALL (*) NEGATIVE   Ketones, ur 15 (*) NEGATIVE mg/dL   Protein, ur 841 (*) NEGATIVE mg/dL   Urobilinogen, UA 1.0  0.0 - 1.0 mg/dL   Nitrite NEGATIVE  NEGATIVE  Leukocytes, UA MODERATE (*) NEGATIVE  URINE MICROSCOPIC-ADD ON   Collection Time    11/24/13  1:20 PM      Result Value Ref Range   Squamous Epithelial / LPF FEW (*) RARE   WBC, UA 7-10  <3 WBC/hpf   RBC / HPF 0-2  <3 RBC/hpf   Bacteria, UA FEW (*) RARE   Urine-Other MUCOUS PRESENT    CULTURE, BLOOD (ROUTINE X 2)   Collection Time    11/24/13  2:00 PM      Result Value Ref Range   Specimen Description BLOOD RT AC     Special Requests NONE BOTTLES DRAWN AEROBIC AND ANAEROBIC 5CC     Culture  Setup Time       Value: 11/24/2013 19:07     Performed at [facility]   Culture       Value:        BLOOD CULTURE RECEIVED NO GROWTH TO DATE CULTURE WILL BE HELD FOR 5 DAYS BEFORE ISSUING A FINAL NEGATIVE REPORT     Performed at [facility]   Report Status PENDING    I-STAT CG4 LACTIC ACID, ED   Collection Time    11/24/13  2:12 PM      Result Value Ref Range   Lactic Acid, Venous 0.55  0.5 - 2.2 mmol/L  CULTURE, BLOOD (ROUTINE X 2)   Collection Time    11/24/13  2:15 PM      Result Value Ref Range   Specimen Description BLOOD RT HAND     Special Requests NONE BOTTLES DRAWN AEROBIC AND ANAEROBIC 4CC     Culture  Setup Time        Value: 11/24/2013 19:07     Performed at [facility]   Culture       Value:        BLOOD CULTURE RECEIVED NO GROWTH TO DATE CULTURE WILL BE HELD FOR 5 DAYS BEFORE ISSUING A FINAL NEGATIVE REPORT     Performed at [facility]   Report Status PENDING    WET PREP, GENITAL   Collection Time    11/24/13  7:41 PM      Result Value Ref Range   Yeast Wet Prep HPF POC NONE SEEN  NONE SEEN   Trich, Wet Prep NONE SEEN  NONE SEEN   Clue Cells Wet Prep HPF POC MODERATE (*) NONE SEEN   WBC, Wet Prep HPF POC MANY (*) NONE SEEN  GC/CHLAMYDIA PROBE AMP   Collection Time    11/24/13  7:42 PM      Result Value Ref Range   CT Probe RNA NEGATIVE  NEGATIVE   GC Probe RNA NEGATIVE  NEGATIVE  CBC   Collection Time    11/25/13  7:56 AM      Result Value Ref Range   WBC 5.8  4.0 - 10.5 K/uL   RBC 3.49 (*) 3.87 - 5.11 MIL/uL   Hemoglobin 10.5 (*) 12.0 - 15.0 g/dL   HCT 40.9 (*) 81.1 - 91.4 %   MCV 91.1  78.0 - 100.0 fL   MCH 30.1  26.0 - 34.0 pg   MCHC 33.0  30.0 - 36.0 g/dL   RDW 78.2  95.6 - 21.3 %   Platelets 113 (*) 150 - 400 K/uL  CBC WITH DIFFERENTIAL   Collection Time    11/24/13 10:02 AM      Result Value Ref Range   WBC 17.9 (*) 4.0 - 10.5  K/uL   RBC 4.14  3.87 - 5.11 Mil/uL   Hemoglobin 12.5  12.0 - 15.0 g/dL   HCT 69.6  29.5 - 28.4 %   MCV 91.1  78.0 - 100.0 fl   MCHC 33.1  30.0 - 36.0 g/dL   RDW 13.2  44.0 - 10.2 %   Platelets 145.0 (*) 150.0 - 400.0 K/uL   Neutrophils Relative % 89.8 Repeated and verified X2. (*) 43.0 - 77.0 %   Lymphocytes Relative 4.3 Repeated and verified X2. (*) 12.0 - 46.0 %   Monocytes Relative 5.8  3.0 - 12.0 %   Eosinophils Relative 0.0  0.0 - 5.0 %   Basophils Relative 0.1  0.0 - 3.0 %   Neutro Abs 16.1 (*) 1.4 - 7.7 K/uL   Lymphs Abs 0.8  0.7 - 4.0 K/uL   Monocytes Absolute 1.0  0.1 - 1.0 K/uL   Eosinophils Absolute 0.0  0.0 - 0.7 K/uL   Basophils Absolute 0.0  0.0 - 0.1 K/uL  COMPREHENSIVE METABOLIC PANEL   Collection Time     11/24/13 10:02 AM      Result Value Ref Range   Sodium 138  135 - 145 mEq/L   Potassium 4.0  3.5 - 5.1 mEq/L   Chloride 105  96 - 112 mEq/L   CO2 26  19 - 32 mEq/L   Glucose, Bld 102 (*) 70 - 99 mg/dL   BUN 8  6 - 23 mg/dL   Creatinine, Ser 0.8  0.4 - 1.2 mg/dL   Total Bilirubin 1.1  0.2 - 1.2 mg/dL   Alkaline Phosphatase 46  39 - 117 U/L   AST 23  0 - 37 U/L   ALT 16  0 - 35 U/L   Total Protein 6.7  6.0 - 8.3 g/dL   Albumin 4.2  3.5 - 5.2 g/dL   Calcium 9.0  8.4 - 72.5 mg/dL   GFR 36.64  >40.34 mL/min  POCT URINALYSIS DIPSTICK   Collection Time    11/24/13 10:02 AM      Result Value Ref Range   Color, UA amber     Clarity, UA cloudy     Glucose, UA neg     Bilirubin, UA small     Ketones, UA 15     Spec Grav, UA 1.020     Blood, UA neg     pH, UA 7.0     Protein, UA 300     Urobilinogen, UA 1.0     Nitrite, UA neg     Leukocytes, UA large (3+)    POCT URINE PREGNANCY   Collection Time    11/24/13 10:03 AM      Result Value Ref Range   Preg Test, Ur Negative    URINE CULTURE   Collection Time    11/24/13 10:10 AM      Result Value Ref Range   Colony Count 50,000 COLONIES/ML     Organism ID, Bacteria Multiple bacterial morphotypes present, none     Organism ID, Bacteria predominant. Suggest appropriate recollection if      Organism ID, Bacteria clinically indicated.      11/24/2013    CT ABDOMEN AND PELVIS WITHOUT CONTRAST  CLINICAL DATA:  Right lower quadrant pain, nausea, vomiting   COMPARISON:  None.  FINDINGS: The study is limited without IV and oral contrast.  Lung bases are unremarkable.  Sagittal images of the spine are unremarkable.  Unenhanced liver is unremarkable. Unenhanced pancreas and adrenal  glands are unremarkable. There is poorly visualized low-density lesion in the splenic dome measures 3 cm. This cannot be characterized without IV contrast. No calcified gallstones are noted within gallbladder. Unenhanced kidneys are symmetrical in size. No  nephrolithiasis. No aortic aneurysm. No hydronephrosis or hydroureter. No calcified ureteral calculi are noted.  There is a low lying cecum. The tip of the cecum is in right anterior pelvis with mild mass effect on the urinary bladder. No pericecal inflammation. Normal appendix is partially visualized in coronal image 47. Unenhanced uterus and adnexa are unremarkable. Small amount of pelvic free fluid noted posterior cul-de-sac. No distal colonic obstruction. No inguinal adenopathy. No destructive bony lesions are noted within pelvis. No small bowel obstruction. No adenopathy.  IMPRESSION: 1. Limited study without IV or oral contrast. No nephrolithiasis. No hydronephrosis or hydroureter. No calcified ureteral calculi. 2. There is a low lying cecum. No pericecal inflammation. Normal appendix partially visualized. 3. Poorly visualized low-density lesion within splenic dome measures about 3 cm. This cannot be characterized without IV contrast. 4. No small bowel or colonic obstruction. Small amount of pelvic free fluid within posterior cul-de-sac.   Electronically Signed   By: Natasha Mead M.D.   On: 11/24/2013 12:58   11/24/2013    TRANSABDOMINAL AND TRANSVAGINAL ULTRASOUND OF PELVIS  DOPPLER ULTRASOUND OF OVARIES  CLINICAL DATA:  Right lower quadrant pain.  Nausea and vomiting TECHNIQUE: Both transabdominal and transvaginal ultrasound examinations of the pelvis were performed. Transabdominal technique was performed for global imaging of the pelvis including uterus, ovaries, adnexal regions, and pelvic cul-de-sac.  It was necessary to proceed with endovaginal exam following the transabdominal exam to visualize the uterus. Color and duplex Doppler ultrasound was utilized to evaluate blood flow to the ovaries.  COMPARISON:  CT scan from the same day.  FINDINGS: Uterus  Measurements: 7.0 x 4.0 x 4.9 cm, within normal limits. No fibroids or other mass visualized.  Endometrium  Thickness: 7 mm, within normal limit. No focal  abnormality visualized.  Right ovary  Measurements: 4.0 x 2.2 x 2.5 cm, within normal limits. Normal appearance/no adnexal mass.  Left ovary  Measurements: 4.6 x 2.3 x 2.6 cm, within normal limits. Normal appearance/no adnexal mass.  Pulsed Doppler evaluation of both ovaries demonstrates normal low-resistance arterial and venous waveforms.  Other findings  A small amount of free fluid is present, likely physiologic  IMPRESSION: 1. May small and free fluid is likely physiologic. 2. Normal appearance of the uterus and ovaries. 3. Normal color Doppler signal and waveforms.   Electronically Signed   By: Gennette Pac M.D.   On: 11/24/2013 14:11   Discharge Exam: Blood pressure 116/78, pulse 50, temperature 97.7 F (36.5 C), temperature source Oral, resp. rate 18, height 5\' 6"  (1.676 m), weight 170 lb (77.111 kg), last menstrual period 11/16/2013, SpO2 99.00%. General appearance: alert and no distress GI: soft, non-tender; bowel sounds normal; no masses,  no organomegaly Pelvic: deferred Extremities: extremities normal, atraumatic, no cyanosis or edema and Homans sign is negative, no sign of DVT  Disposition: Home    Medication List         amphetamine-dextroamphetamine 20 MG 24 hr capsule  Commonly known as:  ADDERALL XR  Take 1 capsule (20 mg total) by mouth daily.     aspirin-acetaminophen-caffeine 250-250-65 MG per tablet  Commonly known as:  EXCEDRIN MIGRAINE  Take 2 tablets by mouth every 6 (six) hours as needed for headache.     doxycycline 50 MG capsule  Commonly  known as:  VIBRAMYCIN  Take 1 capsule (50 mg total) by mouth 2 (two) times daily.     ibuprofen 600 MG tablet  Commonly known as:  ADVIL,MOTRIN  Take 1 tablet (600 mg total) by mouth every 6 (six) hours as needed.     levonorgestrel 0.75 MG tablet  Commonly known as:  PLAN B,NEXT CHOICE  Take 0.75 mg by mouth every 12 (twelve) hours.     metroNIDAZOLE 500 MG tablet  Commonly known as:  FLAGYL  Take 1 tablet (500 mg  total) by mouth 2 (two) times daily.     ondansetron 4 MG tablet  Commonly known as:  ZOFRAN  Take 1 tablet (4 mg total) by mouth every 6 (six) hours as needed for nausea.     oxyCODONE-acetaminophen 5-325 MG per tablet  Commonly known as:  PERCOCET/ROXICET  Take 1-2 tablets by mouth every 3 (three) hours as needed for severe pain (moderate to severe pain (when tolerating fluids)).     promethazine 25 MG tablet  Commonly known as:  PHENERGAN  Take 1 tablet (25 mg total) by mouth every 6 (six) hours as needed for nausea or vomiting.       Follow-up Information   Follow up with PCP referred GYN physician. (2-3 weeks for followup.  )       Signed: Tereso NewcomerANYANWU,Ebrima Ranta A, MD 11/26/2013, 1:33 PM

## 2013-11-26 NOTE — Discharge Instructions (Signed)
Pelvic Inflammatory Disease °Pelvic inflammatory disease (PID) refers to an infection in some or all of the female organs. The infection can be in the uterus, ovaries, fallopian tubes, or the surrounding tissues in the pelvis. PID can cause abdominal or pelvic pain that comes on suddenly (acute pelvic pain). PID is a serious infection because it can lead to lasting (chronic) pelvic pain or the inability to have children (infertile).  °CAUSES  °The infection is often caused by the normal bacteria found in the vaginal tissues. PID may also be caused by an infection that is spread during sexual contact. PID can also occur following:  °· The birth of a baby.   °· A miscarriage.   °· An abortion.   °· Major pelvic surgery.   °· The use of an intrauterine device (IUD).   °· A sexual assault.   °RISK FACTORS °Certain factors can put a person at higher risk for PID, such as: °· Being younger than 25 years. °· Being sexually active at a young age. °· Using nonbarrier contraception. °· Having multiple sexual partners. °· Having sex with someone who has symptoms of a genital infection. °· Using oral contraception. °Other times, certain behaviors can increase the possibility of getting PID, such as: °· Having sex during your period. °· Using a vaginal douche. °· Having an intrauterine device (IUD) in place. °SYMPTOMS  °· Abdominal or pelvic pain.   °· Fever.   °· Chills.   °· Abnormal vaginal discharge. °· Abnormal uterine bleeding.   °· Unusual pain shortly after finishing your period. °DIAGNOSIS  °Your caregiver will choose some of the following methods to make a diagnosis, such as:  °· Performing a physical exam and history. A pelvic exam typically reveals a very tender uterus and surrounding pelvis.   °· Ordering laboratory tests including a pregnancy test, blood tests, and urine test.  °· Ordering cultures of the vagina and cervix to check for a sexually transmitted infection (STI). °· Performing an ultrasound.    °· Performing a laparoscopic procedure to look inside the pelvis.   °TREATMENT  °· Antibiotic medicines may be prescribed and taken by mouth.   °· Sexual partners may be treated when the infection is caused by a sexually transmitted disease (STD).   °· Hospitalization may be needed to give antibiotics intravenously. °· Surgery may be needed, but this is rare. °It may take weeks until you are completely well. If you are diagnosed with PID, you should also be checked for human immunodeficiency virus (HIV).   °HOME CARE INSTRUCTIONS  °· If given, take your antibiotics as directed. Finish the medicine even if you start to feel better.   °· Only take over-the-counter or prescription medicines for pain, discomfort, or fever as directed by your caregiver.   °· Do not have sexual intercourse until treatment is completed or as directed by your caregiver. If PID is confirmed, your recent sexual partner(s) will need treatment.   °· Keep your follow-up appointments. °SEEK MEDICAL CARE IF:  °· You have increased or abnormal vaginal discharge.   °· You need prescription medicine for your pain.   °· You vomit.   °· You cannot take your medicines.   °· Your partner has an STD.   °SEEK IMMEDIATE MEDICAL CARE IF:  °· You have a fever.   °· You have increased abdominal or pelvic pain.   °· You have chills.   °· You have pain when you urinate.   °· You are not better after 72 hours following treatment.   °MAKE SURE YOU:  °· Understand these instructions. °· Will watch your condition. °· Will get help right away if you are not doing well or get worse. °  Document Released: 04/17/2005 Document Revised: 08/12/2012 Document Reviewed: 04/13/2011 °ExitCare® Patient Information ©2015 [employer]. This information is not intended to replace advice given to you by your health care provider. Make sure you discuss any questions you have with your health care provider. ° °

## 2013-11-30 LAB — CULTURE, BLOOD (ROUTINE X 2)
Culture: NO GROWTH
Culture: NO GROWTH

## 2013-12-01 ENCOUNTER — Telehealth: Payer: Self-pay

## 2013-12-01 DIAGNOSIS — F952 Tourette's disorder: Secondary | ICD-10-CM

## 2013-12-01 MED ORDER — AMPHETAMINE-DEXTROAMPHET ER 20 MG PO CP24: 20.0000 mg | ORAL_CAPSULE | Freq: Every day | ORAL | Status: DC

## 2013-12-01 NOTE — Telephone Encounter (Signed)
pls send as refill, if I didn't renew it. I thought I did, but I don't remember signing it.

## 2013-12-01 NOTE — Telephone Encounter (Signed)
Pt called and states she never received an Rx for Adderall on 11/18/13 when she was seen by Layne. She is out of her medicine. Can we print another Rx?

## 2013-12-01 NOTE — Telephone Encounter (Signed)
Spoke with pt, advised Rx ready to pick up. 

## 2013-12-02 ENCOUNTER — Encounter: Payer: Self-pay | Admitting: Neurology

## 2013-12-02 ENCOUNTER — Ambulatory Visit (INDEPENDENT_AMBULATORY_CARE_PROVIDER_SITE_OTHER): Payer: 59 | Admitting: Neurology

## 2013-12-02 VITALS — BP 115/76 | HR 75

## 2013-12-02 DIAGNOSIS — G43909 Migraine, unspecified, not intractable, without status migrainosus: Secondary | ICD-10-CM | POA: Insufficient documentation

## 2013-12-02 DIAGNOSIS — F952 Tourette's disorder: Secondary | ICD-10-CM

## 2013-12-02 MED ORDER — RIZATRIPTAN BENZOATE 5 MG PO TBDP: 5.0000 mg | ORAL_TABLET | ORAL | Status: DC | PRN

## 2013-12-02 NOTE — Progress Notes (Signed)
PATIENT: Cindy Jones DOB: 1990-11-25  HISTORICAL  Cindy Jones is a 23 years old right-handed female, accompanied by her friends at today's clinical visit, she is referred by her primary care physician Dr. Alben SpittleWeaver for evaluation of constellation of symptoms  She was diagnosed with Tourette's syndrome while she was attending [place] at [employer] [place] few years ago, she presented with different motor tics, including flickering of her eyes, tightening her chest, she has a tendency to place things neatly "in her ways". But denies other obsessive compulsive behavior. She also carries a diagnosis of ADD, has been taking Adderall xr 20 mg every day.  As long as she could remember, when she shifted her visual attention, it usually takes few moments for her to refocus, she denies loss of vision, denies double vision, no dysarthria, no generalized weakness,  Over the past few years, she also has intermittent subjective left arm, and leg weakness, she complains of frequent headaches, lateralized severe pounding headaches with associated light noise sensitivity, nauseous, movement made it worse.  She has increased headaches over the past few months,  Excedrin Migraine works most of the time, she has never tried triptan treatment.  Trigger for her headaches are stress, sleep deprivation  REVIEW OF SYSTEMS: Full 14 system review of systems performed and notable only for blurred vision, double vision, confusion, headaches  ALLERGIES: Allergies  Allergen Reactions  . Latex Swelling and Rash  . Sulfa Antibiotics Hives and Swelling    HOME MEDICATIONS: Current Outpatient Prescriptions on File Prior to Visit  Medication Sig Dispense Refill  . amphetamine-dextroamphetamine (ADDERALL XR) 20 MG 24 hr capsule Take 1 capsule (20 mg total) by mouth daily.  30 capsule  0  . aspirin-acetaminophen-caffeine (EXCEDRIN MIGRAINE) 250-250-65 MG per tablet Take 2 tablets by mouth every 6 (six) hours as  needed for headache.      . doxycycline (VIBRAMYCIN) 50 MG capsule Take 1 capsule (50 mg total) by mouth 2 (two) times daily.  24 capsule  0  . ibuprofen (ADVIL,MOTRIN) 600 MG tablet Take 1 tablet (600 mg total) by mouth every 6 (six) hours as needed.  30 tablet  1  . levonorgestrel (PLAN B,NEXT CHOICE) 0.75 MG tablet Take 0.75 mg by mouth every 12 (twelve) hours.      . metroNIDAZOLE (FLAGYL) 500 MG tablet Take 1 tablet (500 mg total) by mouth 2 (two) times daily.  24 tablet  0  . ondansetron (ZOFRAN) 4 MG tablet Take 1 tablet (4 mg total) by mouth every 6 (six) hours as needed for nausea.  20 tablet  0  . oxyCODONE-acetaminophen (PERCOCET/ROXICET) 5-325 MG per tablet Take 1-2 tablets by mouth every 3 (three) hours as needed for severe pain (moderate to severe pain (when tolerating fluids)).  30 tablet  0  . promethazine (PHENERGAN) 25 MG tablet Take 1 tablet (25 mg total) by mouth every 6 (six) hours as needed for nausea or vomiting.  30 tablet  2   No current facility-administered medications on file prior to visit.    PAST MEDICAL HISTORY: Past Medical History  Diagnosis Date  . Frequent headaches   . Migraines   . Urinary tract bacterial infections   . Tourette disorder   . Binge eating disorder     PAST SURGICAL HISTORY: Past Surgical History  Procedure Laterality Date  . Tonsillectomy and adenoidectomy  2013    FAMILY HISTORY: Family History  Problem Relation Age of Onset  . Alcohol abuse Mother   .  Cancer Father     sarcoma  . Tourette syndrome Maternal Aunt   . Hyperlipidemia Paternal Aunt   . Tourette syndrome Maternal Grandfather   . Heart disease Paternal Grandfather     SOCIAL HISTORY:  History   Social History  . Marital Status: Single    Spouse Name: N/A    Number of Children: 0  . Years of Education: N/A   Occupational History  . Not on file.   Social History Main Topics  . Smoking status: Never Smoker   . Smokeless tobacco: Never Used  .  Alcohol Use: 1.8 oz/week    3 Glasses of wine per week  . Drug Use: No  . Sexual Activity: Not on file   Other Topics Concern  . Not on file   Social History Narrative   Ms. Phill Myron recently graduated from [employer]. She works FT as a Patent attorney. She attended boarding school in [place] when in HS. Her father died when she was 30 yo. Her mother is an alcoholic. Her aunt & uncle were her guardians when she was in HS. They live in [place], [place]. She has a fraternal twin sister.      PHYSICAL EXAM   Filed Vitals:   12/02/13 0819  BP: 115/76  Pulse: 75    Not recorded    There is no weight on file to calculate BMI.   Generalized: In no acute distress  Neck: Supple, no carotid bruits   Cardiac: Regular rate rhythm  Pulmonary: Clear to auscultation bilaterally  Musculoskeletal: No deformity  Neurological examination  Mentation: Alert oriented to time, place, history taking, and causual conversation  Cranial nerve II-XII: Pupils were equal round reactive to light. Extraocular movements were full.  Visual field were full on confrontational test. Bilateral fundi were sharp. Cover and uncover testing demonstrated mild bilateral exophoria Facial sensation and strength were normal. Hearing was intact to finger rubbing bilaterally. Uvula tongue midline.  Head turning and shoulder shrug and were normal and symmetric.Tongue protrusion into cheek strength was normal.  Motor: Normal tone, bulk and strength.  Sensory: Intact to fine touch, pinprick, preserved vibratory sensation, and proprioception at toes.  Coordination: Normal finger to nose, heel-to-shin bilaterally there was no truncal ataxia  Gait: Rising up from seated position without assistance, normal stance, without trunk ataxia, moderate stride, good arm swing, smooth turning, able to perform tiptoe, and heel walking without difficulty.   Romberg signs: Negative  Deep tendon reflexes: Brachioradialis 2/2,  biceps 2/2, triceps 2/2, patellar 2/2, Achilles 2/2, plantar responses were flexor bilaterally.   DIAGNOSTIC DATA (LABS, IMAGING, TESTING) - I reviewed patient records, labs, notes, testing and imaging myself where available.  Lab Results  Component Value Date   WBC 5.8 11/25/2013   HGB 10.5* 11/25/2013   HCT 31.8* 11/25/2013   MCV 91.1 11/25/2013   PLT 113* 11/25/2013      Component Value Date/Time   NA 139 11/24/2013 1155   K 4.1 11/24/2013 1155   CL 101 11/24/2013 1155   CO2 25 11/24/2013 1155   GLUCOSE 117* 11/24/2013 1155   BUN 9 11/24/2013 1155   CREATININE 0.80 11/24/2013 1155   CALCIUM 9.3 11/24/2013 1155   PROT 7.3 11/24/2013 1155   ALBUMIN 4.3 11/24/2013 1155   AST 19 11/24/2013 1155   ALT 14 11/24/2013 1155   ALKPHOS 51 11/24/2013 1155   BILITOT 1.0 11/24/2013 1155   GFRNONAA >90 11/24/2013 1155   GFRAA >90 11/24/2013 1155  ASSESSMENT AND PLAN  Cindy Jones is a 23 y.o. female complains of  motor tics, migraine headaches, subjective left-sided weakness, essentially normal neurological examination.  1. she denies further evaluation, proceed with MRI of the brain, 2 Maxalt as needed for migraine headaches 3 return to clinic in 2-3 months with Eber Jones.    Levert Feinstein, M.D. Ph.D.  [facility] [address], [address] [place], [place] [address] [phone]

## 2013-12-02 NOTE — Telephone Encounter (Signed)
While patient was picking up Rx she mentioned getting a referral with [facility] for future Adderall Rx. Is it okay?

## 2013-12-03 NOTE — Telephone Encounter (Signed)
pls call pt: I have given her referral for neurology because she told me she is taking adderall for tourettes. Is she taking it for something else? Did she go to neuro appt.?

## 2013-12-04 ENCOUNTER — Telehealth: Payer: Self-pay | Admitting: Nurse Practitioner

## 2013-12-04 DIAGNOSIS — F988 Other specified behavioral and emotional disorders with onset usually occurring in childhood and adolescence: Secondary | ICD-10-CM

## 2013-12-04 DIAGNOSIS — F952 Tourette's disorder: Secondary | ICD-10-CM

## 2013-12-04 NOTE — Telephone Encounter (Signed)
pls call pt: Advise Referral has been placed for psychiatry. I will prescribe adderall until she is able to get appointment. Please ask when she will see gynecology. Gynecology will need to see her due to recent infection. Let me know if she has questions, I will call her.

## 2013-12-04 NOTE — Telephone Encounter (Signed)
Left detailed message on pt's vm requesting cb.

## 2013-12-04 NOTE — Telephone Encounter (Signed)
Patient stated that she did go to neuro appt. Neurologist advised patient that she does not prescribe Adderall. Neuro advised patient to see Behavioral Health. Patient stated that she saw Behavioral Health when she first began taking Adderall for ADHD as a child. The therapist diagnosed pt with tourette's while treating her ADHD.

## 2013-12-10 ENCOUNTER — Telehealth: Payer: Self-pay | Admitting: Nurse Practitioner

## 2013-12-10 NOTE — Telephone Encounter (Signed)
hospital admsn: WBC 20k, mild anemia 10.5, platelets 113k. Appendicitis r/o, presumed PID, but CG cultures neg, wet prep showed BV. Blood cultures neg. Treated w/doxy & cefotetan. WBC normalized w/in 24 hrs. Tick fever?  Would like to check CBC-anemia & platelets.  Neuro eval: mild bilat exophoria MRI brain ordered. F/u 2-3 mos.  BH ref. Apt sched for Sept.  LM for pt to CB.

## 2013-12-11 ENCOUNTER — Encounter: Payer: Self-pay | Admitting: Obstetrics & Gynecology

## 2013-12-11 ENCOUNTER — Ambulatory Visit (INDEPENDENT_AMBULATORY_CARE_PROVIDER_SITE_OTHER): Payer: 59 | Admitting: Obstetrics & Gynecology

## 2013-12-11 VITALS — BP 112/79 | HR 78 | Ht 66.0 in | Wt 175.0 lb

## 2013-12-11 DIAGNOSIS — Z124 Encounter for screening for malignant neoplasm of cervix: Secondary | ICD-10-CM

## 2013-12-11 DIAGNOSIS — Z8742 Personal history of other diseases of the female genital tract: Secondary | ICD-10-CM

## 2013-12-11 DIAGNOSIS — Z113 Encounter for screening for infections with a predominantly sexual mode of transmission: Secondary | ICD-10-CM

## 2013-12-11 DIAGNOSIS — N730 Acute parametritis and pelvic cellulitis: Secondary | ICD-10-CM

## 2013-12-11 DIAGNOSIS — L259 Unspecified contact dermatitis, unspecified cause: Secondary | ICD-10-CM

## 2013-12-11 MED ORDER — CEFTRIAXONE SODIUM 250 MG IJ SOLR
250.0000 mg | Freq: Once | INTRAMUSCULAR | Status: AC
Start: 2013-12-11 — End: 2013-12-11
  Administered 2013-12-11: 250 mg via INTRAMUSCULAR

## 2013-12-11 MED ORDER — CEFTRIAXONE SODIUM 250 MG IJ SOLR: 250.0000 mg | Freq: Once | INTRAMUSCULAR | Status: DC

## 2013-12-11 NOTE — Progress Notes (Signed)
   Subjective:    Patient ID: Cindy Jones, female    DOB: 1991/04/12, 23 y.o.   MRN: [id number]  HPI  23 yo SW [id number] [place] 2014. She is here today for follow up after a hospital admission 2 weeks ago for PID. Her cervical cultures were negative for GC/CT. She says that she has had negative for STI testing at an urgent care on [address] about a month ago. Her boyfriend told her yesterday that he just tested positive for GC. He was treated. She has had unprotected sex since her hospital discharge. They have been together for about 3 months.  Review of Systems She has been using condoms just before ejaculation.     Objective:   Physical Exam  EG- excema of groin BL Normal vaginal discharge No CMT      Assessment & Plan:  Preventative- pap today Contraception- we discussed options. If she wants Mirena, then I would suggest waiting for at least 2 months and making sure that her boyfriend has a negative TOC in a month. I have also given her info on the vaginal ring study. Rec cortisone for excema [place] today with IM rocephin since she had IC with infected BF recently

## 2013-12-12 LAB — WET PREP, GENITAL
Clue Cells Wet Prep HPF POC: NONE SEEN
Trich, Wet Prep: NONE SEEN
WBC, Wet Prep HPF POC: NONE SEEN
Yeast Wet Prep HPF POC: NONE SEEN

## 2013-12-12 LAB — CYTOLOGY - PAP

## 2013-12-24 ENCOUNTER — Encounter (HOSPITAL_BASED_OUTPATIENT_CLINIC_OR_DEPARTMENT_OTHER): Payer: Self-pay

## 2013-12-24 ENCOUNTER — Ambulatory Visit (INDEPENDENT_AMBULATORY_CARE_PROVIDER_SITE_OTHER): Payer: 59 | Admitting: Nurse Practitioner

## 2013-12-24 ENCOUNTER — Ambulatory Visit (HOSPITAL_BASED_OUTPATIENT_CLINIC_OR_DEPARTMENT_OTHER)
Admission: RE | Admit: 2013-12-24 | Discharge: 2013-12-24 | Disposition: A | Discharge status: Home / Self Care | Payer: 59 | Source: Ambulatory Visit | Attending: Nurse Practitioner | Admitting: Nurse Practitioner

## 2013-12-24 ENCOUNTER — Encounter: Payer: Self-pay | Admitting: Nurse Practitioner

## 2013-12-24 ENCOUNTER — Other Ambulatory Visit: Payer: Self-pay | Admitting: Nurse Practitioner

## 2013-12-24 VITALS — BP 129/84 | HR 81 | Temp 97.7°F | Resp 18 | Ht 66.0 in | Wt 172.0 lb

## 2013-12-24 DIAGNOSIS — D739 Disease of spleen, unspecified: Secondary | ICD-10-CM | POA: Insufficient documentation

## 2013-12-24 DIAGNOSIS — N926 Irregular menstruation, unspecified: Secondary | ICD-10-CM

## 2013-12-24 DIAGNOSIS — R809 Proteinuria, unspecified: Secondary | ICD-10-CM | POA: Insufficient documentation

## 2013-12-24 DIAGNOSIS — R10819 Abdominal tenderness, unspecified site: Secondary | ICD-10-CM | POA: Insufficient documentation

## 2013-12-24 DIAGNOSIS — Z8742 Personal history of other diseases of the female genital tract: Secondary | ICD-10-CM

## 2013-12-24 DIAGNOSIS — R300 Dysuria: Secondary | ICD-10-CM | POA: Insufficient documentation

## 2013-12-24 LAB — POCT WET PREP WITH KOH
Clue Cells Wet Prep HPF POC: NEGATIVE
KOH Prep POC: NEGATIVE
RBC Wet Prep HPF POC: NEGATIVE
Trichomonas, UA: NEGATIVE

## 2013-12-24 LAB — CBC WITH DIFFERENTIAL/PLATELET
Basophils Absolute: 0 10*3/uL (ref 0.0–0.1)
Basophils Relative: 0.6 % (ref 0.0–3.0)
Eosinophils Absolute: 0.1 10*3/uL (ref 0.0–0.7)
Eosinophils Relative: 1.2 % (ref 0.0–5.0)
HCT: 41.2 % (ref 36.0–46.0)
Hemoglobin: 13.6 g/dL (ref 12.0–15.0)
Lymphocytes Relative: 21.3 % (ref 12.0–46.0)
Lymphs Abs: 1.2 10*3/uL (ref 0.7–4.0)
MCHC: 33.1 g/dL (ref 30.0–36.0)
MCV: 90.5 fl (ref 78.0–100.0)
Monocytes Absolute: 0.5 10*3/uL (ref 0.1–1.0)
Monocytes Relative: 9.1 % (ref 3.0–12.0)
Neutro Abs: 3.8 10*3/uL (ref 1.4–7.7)
Neutrophils Relative %: 67.8 % (ref 43.0–77.0)
Platelets: 182 10*3/uL (ref 150.0–400.0)
RBC: 4.55 Mil/uL (ref 3.87–5.11)
RDW: 13.4 % (ref 11.5–15.5)
WBC: 5.6 10*3/uL (ref 4.0–10.5)

## 2013-12-24 LAB — COMPREHENSIVE METABOLIC PANEL
ALT: 19 U/L (ref 0–35)
AST: 25 U/L (ref 0–37)
Albumin: 4.7 g/dL (ref 3.5–5.2)
Alkaline Phosphatase: 38 U/L — ABNORMAL LOW (ref 39–117)
BUN: 9 mg/dL (ref 6–23)
CO2: 26 mEq/L (ref 19–32)
Calcium: 9.3 mg/dL (ref 8.4–10.5)
Chloride: 101 mEq/L (ref 96–112)
Creatinine, Ser: 0.7 mg/dL (ref 0.4–1.2)
GFR: 106.6 mL/min (ref >60.00)
Glucose, Bld: 89 mg/dL (ref 70–99)
Potassium: 4.5 mEq/L (ref 3.5–5.1)
Sodium: 134 mEq/L — ABNORMAL LOW (ref 135–145)
Total Bilirubin: 0.6 mg/dL (ref 0.2–1.2)
Total Protein: 7.6 g/dL (ref 6.0–8.3)

## 2013-12-24 LAB — POCT URINALYSIS DIPSTICK
Blood, UA: NEGATIVE
Glucose, UA: NEGATIVE
Ketones, UA: 15
Leukocytes, UA: NEGATIVE
Nitrite, UA: NEGATIVE
Protein, UA: 100
Spec Grav, UA: >=1.03
Urobilinogen, UA: 1
pH, UA: 6

## 2013-12-24 LAB — MICROALBUMIN / CREATININE URINE RATIO
Creatinine,U: 30.6 mg/dL
Microalb Creat Ratio: 2.3 mg/g (ref 0.0–30.0)
Microalb, Ur: 0.7 mg/dL (ref 0.0–1.9)

## 2013-12-24 LAB — POCT URINE PREGNANCY: Preg Test, Ur: NEGATIVE

## 2013-12-24 MED ORDER — IOHEXOL 300 MG/ML  SOLN
100.0000 mL | Freq: Once | INTRAMUSCULAR | Status: AC | PRN
  Administered 2013-12-24: 100 mL via INTRAVENOUS

## 2013-12-24 NOTE — Patient Instructions (Signed)
I am not sure what is causing discomfort. Exam is normal. Lab results may help. Our office will call with lab results. Please get CT scan of abs & pelvis to follow up on abnormal spleen.  Please use condom with all penetration.

## 2013-12-24 NOTE — Progress Notes (Signed)
Pre visit review using our clinic review tool, if applicable. No additional management support is needed unless otherwise documented below in the visit note. 

## 2013-12-24 NOTE — Progress Notes (Signed)
Subjective:     Cindy Jones is a 23 y.o. female who presents for evaluation of vaginal burning & dysuria. She is sexually active & boyfriend was treated for nongonococcal urethritis a few weeks ago. At that time, she presented to gynecology for hospital follow up. She was examined, wet prep & pap performed, treated w/rocephin.  Today, she has associated symptoms of MC 2 weeks late, occasional dull crampy sensation low abdomen for last 2 weeks. She denies nausea, flank pain, fever, indigestion, anorexia. She was hospitalized for 2 days w/RLQ and lower abdominal pain, treated for suspected PID. Hospital course:  Appendicitis was ruled out on imaging. She had subjective fevers (no objective fevers noted), WBC of 20. started on antibiotics (Cefotetan and Doxycyline) and given analgesics as needed. Her pain improved, WBC decreased to 5.8 the following day. GC/Chlam cultures were negative, but wet prep showed moderate clue cells concerning for BV treated with Metronidazole. Negative urine culture. On 11/26/2013, she was deemed stable for discharge to home with outpatient follow up.   The following portions of the patient's history were reviewed and updated as appropriate: allergies, current medications, past family history, past medical history, past social history, past surgical history and problem list.   Review of Systems Pertinent items are noted in HPI.    Objective:    BP 129/84  Pulse 81  Temp(Src) 97.7 F (36.5 C) (Oral)  Resp 18  Ht  (1.676 m)  Wt 172 lb (78.019 kg)  BMI 27.77 kg/m2  SpO2 100%  LMP 11/12/2013 General appearance: alert, cooperative, appears stated age and no distress Head: Normocephalic, without obvious abnormality, atraumatic Eyes: negative findings: lids and lashes normal and conjunctivae and sclerae normal Abdomen: no HSM or masses. tender & guarding at LUQ, epigastric, & bilat lower abdomen & bilat pelvis. Pelvic: adnexae not palpable, external genitalia  normal, no adnexal masses or tenderness, no bladder tenderness, no cervical motion tenderness, perianal skin: no external genital warts noted, urethra without abnormality or discharge and cervix has small excoriated area-no bleeding w/swab, scant thin beige d/c. no odor.    Assessment:   1. Dysuria - POCT urinalysis dipstick- pod protein, bilirubin, SG 1.025, ketones - Urine culture - POCT Wet Prep with KOH-few buds, no hyphae or clue cells  2. Missed period - POCT urine pregnancy-neg Likely due to recent pelvic infection  3. History of PID Hospitalization 1 mo ago. Splenic abscess or lesion noted on CT scan. - CT Abdomen Pelvis W Contrast; Future - CBC with Differential - Comprehensive metabolic panel - HIV antibody - GC/Chlamydia Probe Amp - POCT Wet Prep with KOH  4. Proteinuria - Microalbumin / creatinine urine ratio  F/u 1 week

## 2013-12-25 ENCOUNTER — Telehealth: Payer: Self-pay | Admitting: Nurse Practitioner

## 2013-12-25 LAB — GC/CHLAMYDIA PROBE AMP
CT Probe RNA: NEGATIVE
GC Probe RNA: NEGATIVE

## 2013-12-25 LAB — URINE CULTURE: Colony Count: 6000

## 2013-12-25 LAB — HIV ANTIBODY (ROUTINE TESTING W REFLEX): HIV 1&2 Ab, 4th Generation: NONREACTIVE

## 2013-12-25 NOTE — Telephone Encounter (Signed)
CT scan continues to show lesion/abscess, unchanged from last scan 1 mo ago. Pt tender on exam. Afebrile. WBC & Hgb have normalized. HIV, GC swab neg. After consulting w/gynecologist & general surgeon, I recommend pt have MRI of spleen-suggested by radiologist.  Discussed w/pt. She wants to discuss w/family. She will let me know if she wants to proceed. She has f/u appt. Next week.

## 2013-12-26 ENCOUNTER — Telehealth: Payer: Self-pay | Admitting: Nurse Practitioner

## 2013-12-26 DIAGNOSIS — N739 Female pelvic inflammatory disease, unspecified: Secondary | ICD-10-CM

## 2013-12-26 DIAGNOSIS — D7389 Other diseases of spleen: Secondary | ICD-10-CM

## 2013-12-26 DIAGNOSIS — D739 Disease of spleen, unspecified: Principal | ICD-10-CM

## 2013-12-26 MED ORDER — DOXYCYCLINE HYCLATE 100 MG PO TABS: 100.0000 mg | ORAL_TABLET | Freq: Two times a day (BID) | ORAL | Status: DC

## 2013-12-26 MED ORDER — METRONIDAZOLE 500 MG PO TABS: 500.0000 mg | ORAL_TABLET | Freq: Two times a day (BID) | ORAL | Status: DC

## 2013-12-26 NOTE — Telephone Encounter (Signed)
Urine culture has bacterial growth, only 6k colonies, but she c/o abd tenderness & vaginal burning, also has bilirubin & protein in ua.  I will start treatment again for PID: doxy 100 mg X 14d & metronidazole 500 bid X 14d. Also, pt had unprotected intercourse w/boyfriend again within last week.   ABX caused nausea & diarrhea. Advised eat yogurt daily at lunch or take probiotic.  OK to use ondansetron. Take abx w/food.  Advised no intercourse until she is symptom free. Cautioned regarding potential complications of PID: infertility, Fitz-hugh Curtis syndrome.  Regarding splenic lesion, she has decided to have MRI to f/u CT scan of lesion. Will place order.  F/u in ofc in 2 weeks-will discuss contraception. Do not recommend IUD, given recent PID.

## 2013-12-30 ENCOUNTER — Telehealth: Payer: Self-pay | Admitting: Nurse Practitioner

## 2013-12-30 NOTE — Telephone Encounter (Signed)
Patient will run out medication before her appointment on 12/15/13 with [facility].

## 2013-12-31 ENCOUNTER — Ambulatory Visit (INDEPENDENT_AMBULATORY_CARE_PROVIDER_SITE_OTHER): Payer: 59

## 2013-12-31 ENCOUNTER — Other Ambulatory Visit: Payer: Self-pay | Admitting: Nurse Practitioner

## 2013-12-31 DIAGNOSIS — F952 Tourette's disorder: Secondary | ICD-10-CM

## 2013-12-31 DIAGNOSIS — K838 Other specified diseases of biliary tract: Secondary | ICD-10-CM

## 2013-12-31 DIAGNOSIS — D739 Disease of spleen, unspecified: Principal | ICD-10-CM

## 2013-12-31 DIAGNOSIS — D7389 Other diseases of spleen: Secondary | ICD-10-CM

## 2013-12-31 MED ORDER — AMPHETAMINE-DEXTROAMPHET ER 20 MG PO CP24: 20.0000 mg | ORAL_CAPSULE | Freq: Every day | ORAL | Status: DC

## 2013-12-31 NOTE — Telephone Encounter (Signed)
Called patient, she will pick up Rx

## 2013-12-31 NOTE — Progress Notes (Signed)
Pt has appt. Pending w/BH mid-sept. Fill adderall today.

## 2014-01-01 ENCOUNTER — Telehealth: Payer: Self-pay | Admitting: Nurse Practitioner

## 2014-01-01 NOTE — Telephone Encounter (Signed)
MRI shows splenic lesion to be hemangioma. No f/u necessary.  Meft detailed msg for pt on cell.

## 2014-01-02 ENCOUNTER — Ambulatory Visit: Payer: 59 | Admitting: Nurse Practitioner

## 2014-01-15 ENCOUNTER — Encounter (HOSPITAL_COMMUNITY): Payer: Self-pay | Admitting: Psychiatry

## 2014-01-15 ENCOUNTER — Ambulatory Visit (INDEPENDENT_AMBULATORY_CARE_PROVIDER_SITE_OTHER): Payer: 59 | Admitting: Psychiatry

## 2014-01-15 VITALS — BP 145/86 | HR 78 | Ht 65.5 in | Wt 172.8 lb

## 2014-01-15 DIAGNOSIS — F952 Tourette's disorder: Secondary | ICD-10-CM

## 2014-01-15 DIAGNOSIS — F988 Other specified behavioral and emotional disorders with onset usually occurring in childhood and adolescence: Secondary | ICD-10-CM | POA: Insufficient documentation

## 2014-01-15 MED ORDER — AMPHETAMINE-DEXTROAMPHET ER 25 MG PO CP24: 25.0000 mg | ORAL_CAPSULE | Freq: Every day | ORAL | Status: DC

## 2014-01-15 NOTE — Progress Notes (Signed)
Psychiatric Assessment Adult  Patient Identification:  Cindy Jones Date of Evaluation:  01/15/2014 Chief Complaint: ADD History of Chief Complaint:  No chief complaint on file.   HPI Comments: Pt was diagnosed with ADD when she was 22 yo. She has been on Adderall ever since. Pt can't recall being on any other stimulants. Notes when she doesn't take it she is unable to focus, will interrupt others, more talkative, handwriting is sloppy, she is unproductive, less goal directed, more irritable, gets impatient easily, misplaces items, unable to follow directions and has lack of attention to details. She does not get figitidy.   With Adderall she is focused, more productive and not impatient. Her handwriting is improved and she is more motivated to start and finish her work. Appetite is good and she is eating 3 meals/day. 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 Sleep is good. Energy is good. Denies irritability when it wears off.  Notes she is more forgetful, tired and has random headaches when it is wearing off. Denies SE from Adderall and she is taking 7 days a week. She is not concerned b/c it controlled with Adderall. She stopped it in college for 1 semester and her grades plumitted and performed poorly.  At night after Adderall wears off or when very stressed she has chest tightness, clicks her tongue, eye blinking. Pt was diagnosed with Tourettes while in college by her psychiatrist.  Symptoms are controlled for the most part with Adderall. Most of the time it doesn't interfere with daily functioning. She has never been on other medications to control symptoms. Reports her grandfather has Tourettes.   Review of Systems Physical Exam  Psychiatric: She has a normal mood and affect. Her speech is normal and behavior is normal. Judgment and thought content normal. Cognition and memory are normal.    Depressive Symptoms: Her dad passed away while  she was in HS. Her mother was an alcoholic and sent her away to boarding school in [place]. States she was depressed. She was treated with antidepressants for 3 months and then she stopped (can't recall name of med). Denies depression, anhedonia, isolation, hopelessness and worthlessness. Denies SI/HI.   (Hypo) Manic Symptoms:   Elevated Mood:  No Irritable Mood:  No Grandiosity:  No Distractibility:  No Labiality of Mood:  No Delusions:  No Hallucinations:  No Impulsivity:  No Sexually Inappropriate Behavior:  No Financial Extravagance:  No Flight of Ideas:  No  Anxiety Symptoms: Excessive Worry:  No has image issues. In HS it was very bad (missed school b/c worried about her appearance) but she has worked thru a lot of it. Denies insomnia, fatigue, headaches due to anxiety. She has periods of high anxiety randomly and will withdraw.  Panic Symptoms:  No Agoraphobia:  No Obsessive Compulsive: No  Symptoms: None, Specific Phobias:  No Social Anxiety:  No  Psychotic Symptoms:  Hallucinations: No None Delusions:  No Paranoia:  No   Ideas of Reference:  No  PTSD Symptoms: Ever had a traumatic exposure:  No Had a traumatic exposure in the last month:  No Re-experiencing: No None Hypervigilance:  No Hyperarousal: No None Avoidance: No None  Traumatic Brain Injury: No   Past Psychiatric History: Diagnosis: ADD and Tourettes, hx of depression and anxiety  Hospitalizations: denies  Outpatient Care: multiple providers in the past. Last was in college  Substance Abuse Care: denies  Self-Mutilation: cut herself once in HS.  Suicidal Attempts: denies. Denies access to guns  Violent Behaviors: denies   Past Medical History:   Past Medical History  Diagnosis Date  . Frequent headaches   . Migraines   . Urinary tract bacterial infections   . Tourette disorder   . Binge eating disorder   . 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 Latex Swelling and Rash  . Sulfa Antibiotics Hives and Swelling   Current Medications:  Current Outpatient Prescriptions  Medication Sig Dispense Refill  . amphetamine-dextroamphetamine (ADDERALL XR) 20 MG 24 hr capsule Take 1 capsule (20 mg total) by mouth daily.  30 capsule  0  . aspirin-acetaminophen-caffeine (EXCEDRIN MIGRAINE) 250-250-65 MG per tablet Take 2 tablets by mouth every 6 (six) hours as needed for headache.      . doxycycline (VIBRA-TABS) 100 MG tablet Take 1 tablet (100 mg total) by mouth 2 (two) times daily.  28 tablet  0  . ibuprofen (ADVIL,MOTRIN) 600 MG tablet Take 1 tablet (600 mg total) by mouth every 6 (six) hours as needed.  30 tablet  1  . metroNIDAZOLE (FLAGYL) 500 MG tablet Take 1 tablet (500 mg total) by mouth 2 (two) times daily.  28 tablet  0  . Multiple Vitamins-Minerals (MULTIVITAL PO) Take 1 tablet by mouth daily.      . cefTRIAXone (ROCEPHIN) 250 MG injection Inject 250 mg into the muscle once.  FOR IM use in LARGE MUSCLE MASS  1 each  0  . rizatriptan (MAXALT-MLT) 5 MG disintegrating tablet Take 1 tablet (5 mg total) by mouth as needed for migraine. May repeat in 2 hours if needed  15 tablet  6   No current facility-administered medications for this visit.    Previous Psychotropic Medications:  Medication Dose                          Substance Abuse History in the last 12 months: Substance Age of 1st Use Last Use Amount Specific Type  Nicotine  denies        Alcohol   Last week  3 glasses a week  wine  Cannabis  denies        Opiates  denies        Cocaine  denies        Methamphetamines  as prescribed        LSD  denies        Ecstasy  denies         Benzodiazepines  denies        Caffeine    daily Coffee daily    Inhalants  denies        Others: denies                          Medical Consequences of Substance Abuse: denies  Legal Consequences of Substance Abuse: denies  Family Consequences of Substance Abuse: denies  Blackouts:  No DT's:  No Withdrawal Symptoms:  No None  Social History: Current Place of Residence: [place], living alone Place of Birth: [place]. Raised in [place] then in [place] in [place]. Father diagnosed with cancer when she was 5 and he passed away when she was 8yo. Mother became alcoholic  after he passed. After he passed she went back and forth to her grandparents. Body image issues due to hyper focused step grandmother on weight.  Family Members: mom and  Twin sister. Close to paternal aunt and uncle Marital Status:  Single Children: 0 Relationships: support from paternal aunt. She has boyfriend and a few friends Education:  Chartered certified accountant in college Educational Problems/Performance: fair Religious Beliefs/Practices: Catholic History of Abuse: none Occupational Experiences: Nurse, learning disability History:  None. Legal History: denies Hobbies/Interests: running, decorating, reading  Family History:   Family History  Problem Relation Age of Onset  . Alcohol abuse Mother   . Cancer Father     sarcoma  . Tourette syndrome Maternal Aunt   . Alcohol abuse Maternal Aunt   . Hyperlipidemia Paternal Aunt   . Tourette syndrome Maternal Grandfather   . Heart disease Paternal Grandfather   . Alcohol abuse Maternal Uncle   . Suicidality Neg Hx     Mental Status Examination/Evaluation: Objective: Attitude: Calm and cooperative  Appearance: Well Groomed, appears to be stated age  Eye Contact::  Good  Speech:  Clear and Coherent and Normal Rate  Volume:  Normal  Mood:  euthymic  Affect:  Appropriate and Full Range  Thought Process:  Goal Directed, Linear and Logical  Orientation:  Full (Time, Place, and Person)  Thought Content:  Negative  Suicidal Thoughts:  No  Homicidal Thoughts:  No  Judgement:  Good  Insight:  Good   Concentration: good  Memory: Immediate-good Recent-good Remote-good  Recall: fair  Language: fair  Gait and Station: normal  [employer] of Knowledge: average  Psychomotor Activity:  Normal  Akathisia:  No  Handed:  Right  AIMS (if indicated): n/a  Assets:  Communication Skills Desire for Improvement Financial Resources/Insurance Housing Intimacy Leisure Time Physical Health Resilience Social Support Talents/Skills Transportation Vocational/Educational        Laboratory/X-Ray Psychological Evaluation(s)   reviewed WNL  none   Assessment:  ADD and Tourettes disorder  AXIS I ADD and Tourettes disorder. Hx of Bulemia  AXIS II Deferred  AXIS III Past Medical History  Diagnosis Date  . Frequent headaches   . Migraines   . Urinary tract bacterial infections   . Tourette disorder   . Binge eating disorder   . Seasonal allergies      AXIS IV economic problems and other psychosocial or environmental problems  AXIS V 51-60 moderate symptoms   Treatment Plan/Recommendations:  Plan of Care:  Medication management with supportive therapy. Risks/benefits and SE of the medication discussed. Pt verbalized understanding and verbal consent obtained for treatment.  Affirm with the patient that the medications are taken as ordered. Patient expressed understanding of how their medications were to be used.   Confidentiality and exclusions reviewed with pt who verbalized understanding.  Pt will fax records from previous psychiatrist for review  Laboratory:  Reviewed labs with pt- 12/24/2013 WNL;  EKG sinus arrhythmia, NSTEMI  Psychotherapy: Therapy: brief supportive therapy provided. Discussed psychosocial stressors in detail.     Medications: increase Adderall XR  po qD for ADD  May consider adding small dose of Zyprexa for Tourettes in the future if needed  Routine PRN Medications:  No  Consultations: none at this time  Safety Concerns:  Pt denies SI and is at an  acute low risk for suicide.Patient told to call clinic if any problems occur. Patient advised to go to ER if they should develop SI/HI, side effects, or if symptoms worsen. Has  crisis numbers to call if needed. Pt verbalized understanding.   Other:  F/up in 3 months or sooner if needed     Oletta Darter, MD 9/17/20159:31 AM

## 2014-02-05 ENCOUNTER — Ambulatory Visit: Payer: Self-pay | Admitting: Obstetrics & Gynecology

## 2014-02-12 ENCOUNTER — Encounter: Payer: Self-pay | Admitting: Obstetrics and Gynecology

## 2014-02-12 ENCOUNTER — Ambulatory Visit (INDEPENDENT_AMBULATORY_CARE_PROVIDER_SITE_OTHER): Payer: 59 | Admitting: Obstetrics and Gynecology

## 2014-02-12 VITALS — BP 128/86 | HR 69 | Ht 65.0 in | Wt 172.0 lb

## 2014-02-12 DIAGNOSIS — Z30011 Encounter for initial prescription of contraceptive pills: Secondary | ICD-10-CM

## 2014-02-12 DIAGNOSIS — Z309 Encounter for contraceptive management, unspecified: Secondary | ICD-10-CM

## 2014-02-12 MED ORDER — NORETHIN-ETH ESTRAD-FE BIPHAS 1 MG-10 MCG / 10 MCG PO TABS: 1.0000 | ORAL_TABLET | Freq: Every day | ORAL | Status: DC

## 2014-02-12 NOTE — Progress Notes (Signed)
Patient ID: Floyde Parkinsmily H McClintock, female   DOB: Feb 03, 1991, 23 y.o.   MRN: [id number] 23 yo presenting today for contraception counseling. [facility] following treatment for PID. [facility]. Patient presenting today for contraception initiation. She is no longer interested in NuvaRing.   Past Medical History  Diagnosis Date  . Frequent headaches   . Migraines   . Urinary tract bacterial infections   . Tourette disorder   . Binge eating disorder   . Seasonal allergies   . PID (acute pelvic inflammatory disease)    Past Surgical History  Procedure Laterality Date  . Tonsillectomy and adenoidectomy  2013   Family History  Problem Relation Age of Onset  . Alcohol abuse Mother   . Cancer Father     sarcoma  . Tourette syndrome Maternal Aunt   . Alcohol abuse Maternal Aunt   . Hyperlipidemia Paternal Aunt   . Tourette syndrome Maternal Grandfather   . Heart disease Paternal Grandfather   . Alcohol abuse Maternal Uncle   . Suicidality Neg Hx    History  Substance Use Topics  . Smoking status: Never Smoker   . Smokeless tobacco: Never Used  . Alcohol Use: 1.8 oz/week    3 Glasses of wine per week   Physical exam: Not indicated  A/P 23 yo here for contraception - patient has taken OCP in the past without any contraindications. She desires to be restarted on it - She may consider Mirena IUD in a few months - RTC in 3 months for BP check and follow-up

## 2014-02-16 ENCOUNTER — Other Ambulatory Visit (HOSPITAL_COMMUNITY): Payer: Self-pay | Admitting: *Deleted

## 2014-02-16 DIAGNOSIS — F952 Tourette's disorder: Secondary | ICD-10-CM

## 2014-02-16 MED ORDER — AMPHETAMINE-DEXTROAMPHET ER 25 MG PO CP24: 25.0000 mg | ORAL_CAPSULE | Freq: Every day | ORAL | Status: DC

## 2014-02-16 NOTE — Telephone Encounter (Signed)
JX:[id number]:Requested refill of Adderall XR 25 mg

## 2014-02-19 ENCOUNTER — Telehealth (HOSPITAL_COMMUNITY): Payer: Self-pay

## 2014-02-19 NOTE — Telephone Encounter (Signed)
02/19/14 1:29pm Patient came and pick-up rx script [id number].[name].[name]/sh

## 2014-03-02 ENCOUNTER — Telehealth: Payer: Self-pay | Admitting: *Deleted

## 2014-03-02 ENCOUNTER — Encounter: Payer: Self-pay | Admitting: Obstetrics and Gynecology

## 2014-03-02 NOTE — Telephone Encounter (Signed)
Spoke to patient to reschedule her appt due to admin time and patient not seeing cm before needed to be scheudled with MM but patient stated that she needed to cancel the appt due to financial  issues.

## 2014-03-09 ENCOUNTER — Ambulatory Visit: Payer: 59 | Admitting: Nurse Practitioner

## 2014-03-17 ENCOUNTER — Other Ambulatory Visit (HOSPITAL_COMMUNITY): Payer: Self-pay | Admitting: *Deleted

## 2014-03-17 DIAGNOSIS — F952 Tourette's disorder: Secondary | ICD-10-CM

## 2014-03-17 MED ORDER — AMPHETAMINE-DEXTROAMPHET ER 25 MG PO CP24: 25.0000 mg | ORAL_CAPSULE | Freq: Every day | ORAL | Status: DC

## 2014-03-20 ENCOUNTER — Telehealth (HOSPITAL_COMMUNITY): Payer: Self-pay

## 2014-03-20 NOTE — Telephone Encounter (Signed)
03/20/14 12:52pm Patient came and pick-up rx script Cindy Gallantmily McClintock [id number].[name].[name]/sh

## 2014-04-16 ENCOUNTER — Ambulatory Visit (INDEPENDENT_AMBULATORY_CARE_PROVIDER_SITE_OTHER): Payer: 59 | Admitting: Psychiatry

## 2014-04-16 ENCOUNTER — Encounter (HOSPITAL_COMMUNITY): Payer: Self-pay | Admitting: Psychiatry

## 2014-04-16 VITALS — BP 114/76 | HR 68 | Ht 65.5 in | Wt 178.6 lb

## 2014-04-16 DIAGNOSIS — F900 Attention-deficit hyperactivity disorder, predominantly inattentive type: Secondary | ICD-10-CM | POA: Insufficient documentation

## 2014-04-16 DIAGNOSIS — F952 Tourette's disorder: Secondary | ICD-10-CM

## 2014-04-16 MED ORDER — AMPHETAMINE-DEXTROAMPHETAMINE 10 MG PO TABS: 10.0000 mg | ORAL_TABLET | Freq: Every day | ORAL | Status: DC

## 2014-04-16 MED ORDER — AMPHETAMINE-DEXTROAMPHET ER 20 MG PO CP24: 20.0000 mg | ORAL_CAPSULE | ORAL | Status: DC

## 2014-04-16 NOTE — Progress Notes (Signed)
[facility] [id number] Progress Note  Cindy Jones [id number] 23 y.o.  04/16/2014 8:52 AM  Chief Complaint: doing good  History of Present Illness: Doing well overall except anxiety related to medical bills. Work is getting better.   After Adderall was increased she noticed an improvement in concentration but she crashes at 5pm hard. After 5pm she is unable to do anything and is overwhelmed. In the early afternoon she is more anxious. Pt wonders if it was better to stay on 20mg  and if needed a 10mg  tab in the afternoon. Sleep is good. No change in appetite and she is 3 decent size meals a day. Some days she is more irritable when she is stressed. Denies GI upset.   Tourettes is better during the day. It centers around eye blinking, chest tightening and flaring her nostrils. She noted dose increase of Adderall seemed to decrease her symptoms minorly. On days she is tired and stressed she has more symptoms.   Suicidal Ideation: No Plan Formed: No Patient has means to carry out plan: No  Homicidal Ideation: No Plan Formed: No Patient has means to carry out plan: No  Review of Systems: Psychiatric: Agitation: No Hallucination: No Depressed Mood: No Insomnia: No Hypersomnia: No Altered Concentration: No Feels Worthless: No Grandiose Ideas: No Belief In Special Powers: No New/Increased Substance Abuse: No Compulsions: No  Neurologic: Headache: No Seizure: No Paresthesias: No  Review of Systems  Constitutional: Negative for fever, chills and weight loss.  HENT: Negative for congestion, ear discharge, nosebleeds and sore throat.   Eyes: Negative for blurred vision, double vision and redness.  Respiratory: Negative for cough, shortness of breath and wheezing.   Cardiovascular: Negative for chest pain, palpitations and leg swelling.  Gastrointestinal: Negative for heartburn, nausea and abdominal pain.  Musculoskeletal: Negative for myalgias, back pain, joint pain  and neck pain.  Skin: Negative for itching and rash.  Neurological: Negative for dizziness, sensory change, speech change, seizures, weakness and headaches.  Psychiatric/Behavioral: Negative for depression, suicidal ideas, hallucinations and substance abuse. The patient is not nervous/anxious and does not have insomnia.      Past Medical, Family, Social History: lives [place] in [place]. Works at [employer]. Ra[place] in [place]. Father diagnosed with cancer when she was 5 and he passed away when she was 8yo. Mother became alcoholic after he passed. After he passed she went back and forth to her grandparents. Body image issues due to hyper focused step grandmother on weight. Family Members: mom and Twin sister. Close to paternal aunt and uncle.  reports that she has never smoked. She has never used smokeless tobacco. She reports that she drinks about 1.8 oz of alcohol per week. She reports that she does not use illicit drugs.  Family History  Problem Relation Age of Onset  . Alcohol abuse Mother   . Cancer Father     sarcoma  . Tourette syndrome Maternal Aunt   . Alcohol abuse Maternal Aunt   . Hyperlipidemia Paternal Aunt   . Tourette syndrome Maternal Grandfather   . Heart disease Paternal Grandfather   . Alcohol abuse Maternal Uncle   . Suicidality Neg Hx     Past Medical History  Diagnosis Date  . Frequent headaches   . Migraines   . Urinary tract bacterial infections   . Tourette disorder   . Binge eating disorder   . Seasonal allergies   . PID (acute pelvic inflammatory disease)  Outpatient Encounter Prescriptions as of 04/16/2014  Medication Sig  . amphetamine-dextroamphetamine (ADDERALL XR) 25 MG 24 hr capsule Take 1 capsule by mouth daily.  [name] aspirin-acetaminophen-caffeine (EXCEDRIN MIGRAINE) 250-250-65 MG per tablet Take 2 tablets by mouth every 6 (six) hours as needed for headache.  . ibuprofen (ADVIL,MOTRIN) 600 MG tablet Take 1 tablet  (600 mg total) by mouth every 6 (six) hours as needed.  . Multiple Vitamins-Minerals (MULTIVITAL PO) Take 1 tablet by mouth daily.  . Norethindrone-Ethinyl Estradiol-Fe Biphas (LO LOESTRIN FE) 1 MG-10 MCG / 10 MCG tablet Take 1 tablet by mouth daily.  . rizatriptan (MAXALT-MLT) 5 MG disintegrating tablet Take 1 tablet (5 mg total) by mouth as needed for migraine. May repeat in 2 hours if needed (Patient not taking: Reported on 04/16/2014)    Past Psychiatric History/Hospitalization(s): Anxiety: Yes Bipolar Disorder: No Depression: Yes Mania: No Psychosis: No Schizophrenia: No Personality Disorder: No Hospitalization for psychiatric illness: No History of Electroconvulsive Shock Therapy: No Prior Suicide Attempts: No  Physical Exam: Constitutional:  BP 114/76 mmHg  Pulse 68  Ht 5' 5.5" (1.664 m)  Wt 178 lb 9.6 oz (81.012 kg)  BMI 29.26 kg/m2  General Appearance: alert, oriented, no acute distress and well nourished  Musculoskeletal: Strength & Muscle Tone: within normal limits Gait & Station: normal Patient leans: N/A  Mental Status Examination/Evaluation: Objective: Attitude: Calm and cooperative  Appearance: Fairly Groomed, appears to be stated age  Eye Contact::  Good  Speech:  Clear and Coherent and Normal Rate  Volume:  Normal  Mood:  euthymic  Affect:  Full Range  Thought Process:  Goal Directed, Linear and Logical  Orientation:  Full (Time, Place, and Person)  Thought Content:  Negative  Suicidal Thoughts:  No  Homicidal Thoughts:  No  Judgement:  Fair  Insight:  Fair  Concentration: good  Memory: Immediate-good Recent-good Remote-good  Recall: fair  Language: fair  Gait and Station: normal  [employer] of Knowledge: average  Psychomotor Activity:  Normal  Akathisia:  No  Handed:  Right  AIMS (if indicated): n/a  Assets:  Manufacturing systems engineer Desire for Improvement Financial Resources/Insurance Housing Intimacy Leisure Time Physical  Health Resilience Social Support Veterinary surgeon Decision Making (Choose Three): Established Problem, Stable/Improving (1), Review of Psycho-Social Stressors (1), Review or order clinical lab tests (1), Review of Medication Regimen & Side Effects (2) and Review of New Medication or Change in Dosage (2)  Assessment: AXIS I ADHD- inattentive type and Tourettes disorder. Hx of Bulemia  AXIS II Deferred  AXIS III Past Medical History  Diagnosis Date  . Frequent headaches   . Migraines   . Urinary tract bacterial infections   . Tourette disorder   . Binge eating disorder   . Seasonal allergies      AXIS IV economic problems and other psychosocial or environmental problems  AXIS V 51-60 moderate symptoms   Treatment Plan/Recommendations:  Plan of Care: Medication management with supportive therapy. Risks/benefits and SE of the medication discussed. Pt verbalized understanding and verbal consent obtained for treatment. Affirm with the patient that the medications are taken as ordered. Patient expressed understanding of how their medications were to be used.    Pt will fax records from previous psychiatrist for review  Laboratory: Reviewed labs with pt- 12/24/2013 WNL; EKG sinus arrhythmia, NSTEMI  Psychotherapy: Therapy: brief supportive therapy provided. Discussed psychosocial stressors in detail.    Medications: change Adderall XR to 20mg  po qD  and 10mg  qLunch as needed for ADD  May consider adding small dose of Zyprexa for Tourettes in the future if needed  Routine PRN Medications: No  Consultations: none at this time  Safety Concerns: Pt denies SI and is at an acute low risk for suicide.Patient told to call clinic if any problems occur. Patient advised to go to ER if they should develop SI/HI, side effects, or if symptoms worsen. Has crisis numbers to call if needed. Pt verbalized  understanding.   Other: F/up in 3 months or sooner if needed    Oletta DarterAGARWAL, Kamry Faraci, MD 04/16/2014

## 2014-04-20 ENCOUNTER — Encounter (HOSPITAL_COMMUNITY): Payer: Self-pay | Admitting: *Deleted

## 2014-04-20 NOTE — Progress Notes (Signed)
Initiated Prior Authorization with [facility] @ [phone] for amphetamine-dextroamphetamine 10 mg daily at lunch Clinical given to representative Ladene Artisterrick Authorization pended to pharmacy for review

## 2014-04-29 ENCOUNTER — Encounter (HOSPITAL_COMMUNITY): Payer: Self-pay | Admitting: *Deleted

## 2014-04-29 NOTE — Progress Notes (Signed)
Amphetamine-dextroamphetamine 10 mg daily at lunch authorized by [facility] @ [phone] Effective 04/20/14 thru 04/20/15 PA # [id number]

## 2014-05-19 ENCOUNTER — Telehealth (HOSPITAL_COMMUNITY): Payer: Self-pay

## 2014-05-19 ENCOUNTER — Other Ambulatory Visit (HOSPITAL_COMMUNITY): Payer: Self-pay

## 2014-05-19 DIAGNOSIS — F952 Tourette's disorder: Secondary | ICD-10-CM

## 2014-05-19 DIAGNOSIS — F900 Attention-deficit hyperactivity disorder, predominantly inattentive type: Secondary | ICD-10-CM

## 2014-05-19 MED ORDER — AMPHETAMINE-DEXTROAMPHETAMINE 10 MG PO TABS: 10.0000 mg | ORAL_TABLET | Freq: Every day | ORAL | Status: DC

## 2014-05-19 MED ORDER — AMPHETAMINE-DEXTROAMPHET ER 20 MG PO CP24: 20.0000 mg | ORAL_CAPSULE | ORAL | Status: DC

## 2014-05-19 NOTE — Telephone Encounter (Signed)
Opened in error

## 2014-05-19 NOTE — Telephone Encounter (Signed)
Telephone call from patient requesting refill of 20mg  and 10mg  Adderall.  Refills authorized by Dr. Michae KavaAgarwal.  Questioned 10mg  with lunch PRN to verify that is the way order was intended.  Dr. Michae KavaAgarwal verified this was correct.  Medication refills completed and printed out for patient pick up.

## 2014-05-19 NOTE — Telephone Encounter (Signed)
Cindy Jones picked up her prescription on 4/09/811/19/16  [facility]  [id number]  dlo

## 2014-06-08 ENCOUNTER — Encounter: Payer: Self-pay | Admitting: Nurse Practitioner

## 2014-06-08 ENCOUNTER — Ambulatory Visit (INDEPENDENT_AMBULATORY_CARE_PROVIDER_SITE_OTHER): Payer: 59 | Admitting: Nurse Practitioner

## 2014-06-08 VITALS — BP 108/70 | HR 91 | Temp 98.3°F | Ht 66.0 in | Wt 183.0 lb

## 2014-06-08 DIAGNOSIS — Z23 Encounter for immunization: Secondary | ICD-10-CM

## 2014-06-08 DIAGNOSIS — L304 Erythema intertrigo: Secondary | ICD-10-CM

## 2014-06-08 MED ORDER — CLOTRIMAZOLE-BETAMETHASONE 1-0.05 % EX CREA: 1.0000 "application " | TOPICAL_CREAM | Freq: Two times a day (BID) | CUTANEOUS | Status: DC

## 2014-06-08 NOTE — Patient Instructions (Signed)
For intertrigo, avoid tight, occlusive, or chaffing clothing. Allow air to area if possible.  Use cream twice daily.  No liquid soaps. Wash with mild soap at least once daily [facility]  Apply Bragg's apple cider vinegar once or twice daily. Allow to dry.  Once redness clears, start using A&D zinc daily to provide moisture barrier. Intertrigo Intertrigo is a skin condition that occurs in between folds of skin in places on the body that rub together a lot and do not get much ventilation. It is caused by heat, moisture, friction, sweat retention, and lack of air circulation, which produces red, irritated patches and, sometimes, scaling or drainage. People who have diabetes, who are obese, or who have treatment with antibiotics are at increased risk for intertrigo. The most common sites for intertrigo to occur include:  The groin.  The breasts.  The armpits.  Folds of abdominal skin.  Webbed spaces between the fingers or toes. Intertrigo may be aggravated by:  Sweat.  Feces.  Yeast or bacteria that are present near skin folds.  Urine.  Vaginal discharge. HOME CARE INSTRUCTIONS  The following steps can be taken to reduce friction and keep the affected area cool and dry:  Expose skin folds to the air.  Keep deep skin folds separated with cotton or linen cloth. Avoid tight fitting clothing that could cause chafing.  Wear open-toed shoes or sandals to help reduce moisture between the toes.  Apply absorbent powders to affected areas as directed by your caregiver.  Apply over-the-counter barrier pastes, such as zinc oxide, as directed by your caregiver.  If you develop a fungal infection in the affected area, your caregiver may have you use antifungal creams. SEEK MEDICAL CARE IF:   The rash is not improving after 1 week of treatment.  The rash is getting worse (more red, more swollen, more painful, or spreading).  You have a fever or chills. MAKE SURE YOU:   Understand these  instructions.  Will watch your condition.  Will get help right away if you are not doing well or get worse. Document Released: 04/17/2005 Document Revised: 07/10/2011 Document Reviewed: 09/30/2009 [facility] Patient Information 2015 [employer] [employer] [place]. This information is not intended to replace advice given to you by your health care provider. Make sure you discuss any questions you have with your health care provider.

## 2014-06-08 NOTE — Progress Notes (Signed)
   Subjective:    Patient ID: Cindy Jones, female    DOB: May 16, 1990, 24 y.o.   MRN: [id number]  HPI Comments: Twin sister has eczema   Rash This is a new problem. The current episode started more than 1 year ago. The problem has been waxing and waning since onset. The affected locations include the right upper leg and left upper leg. The rash is characterized by itchiness and redness. She was exposed to nothing. Treatments tried: "eczema lotions" The treatment provided no relief. Her past medical history is significant for eczema.      Review of Systems  Skin: Positive for rash.       Objective:   Physical Exam  Constitutional: She is oriented to person, place, and time. She appears well-developed and well-nourished. No distress.  HENT:  Head: Normocephalic and atraumatic.  Eyes: Conjunctivae are normal. Right eye exhibits no discharge. Left eye exhibits no discharge.  Cardiovascular: Normal rate.   Pulmonary/Chest: Effort normal. No respiratory distress.  Neurological: She is alert and oriented to person, place, and time.  Skin: Skin is warm and dry. Rash noted.  Patch, well demarcated lesion upper inner thighs, maculopapular, pink, some excoriated areas.  R inguinal LAD-rash bigger on R side than L.  Psychiatric: She has a normal mood and affect. Her behavior is normal. Thought content normal.  Vitals reviewed.         Assessment & Plan:  1. Intertrigo - clotrimazole-betamethasone (LOTRISONE) cream; Apply 1 application topically 2 (two) times daily.  Dispense: 45 g; Refill: 1  2. Need for prophylactic vaccination and inoculation against influenza - Flu Vaccine QUAD 36+ mos IM  Skin care instructions explained & written instructions given. F/u 3 weeks.

## 2014-06-08 NOTE — Progress Notes (Signed)
Pre visit review using our clinic review tool, if applicable. No additional management support is needed unless otherwise documented below in the visit note. 

## 2014-06-18 ENCOUNTER — Other Ambulatory Visit (HOSPITAL_COMMUNITY): Payer: Self-pay | Admitting: *Deleted

## 2014-06-18 DIAGNOSIS — F952 Tourette's disorder: Secondary | ICD-10-CM

## 2014-06-18 DIAGNOSIS — F900 Attention-deficit hyperactivity disorder, predominantly inattentive type: Secondary | ICD-10-CM

## 2014-06-18 MED ORDER — AMPHETAMINE-DEXTROAMPHET ER 20 MG PO CP24: 20.0000 mg | ORAL_CAPSULE | ORAL | Status: DC

## 2014-06-18 MED ORDER — AMPHETAMINE-DEXTROAMPHETAMINE 10 MG PO TABS: 10.0000 mg | ORAL_TABLET | Freq: Every day | ORAL | Status: DC

## 2014-06-18 NOTE — Telephone Encounter (Signed)
Yes refill is ok

## 2014-06-18 NOTE — Telephone Encounter (Signed)
Dr. Michae KavaAgarwal authorized refill of patient's Adderall and Adderall XR orders.  Called patient to inform refills authorized and printed out and patient agreed to pick up prescriptions.

## 2014-06-18 NOTE — Telephone Encounter (Signed)
Patient called stating she needed refill of Adderall. Patient's last office visit was 04-16-2014. Patient is scheduled for follow up visit on 07-16-14. Is it okay to refill?  Thank you.

## 2014-06-19 ENCOUNTER — Telehealth (HOSPITAL_COMMUNITY): Payer: Self-pay | Admitting: *Deleted

## 2014-06-19 NOTE — Telephone Encounter (Signed)
[name] came in to pick up her RX.  ZOX::[id number]

## 2014-06-19 NOTE — Telephone Encounter (Signed)
RECEIVED VIA FAX PRIOR AUTH REQUEST FOR D-AMPHETAMINE SALT(ADDERALL)XR 20 MG CAPSULES. CALLED [phone]. [facility]  MEDICATION WAS APPROVED UNTIL 06-2015. [id number]. LETTER WILL BE MAILED TO PATIENT. PHARMACY NOTIFIED.

## 2014-06-30 ENCOUNTER — Ambulatory Visit (INDEPENDENT_AMBULATORY_CARE_PROVIDER_SITE_OTHER): Payer: 59 | Admitting: Nurse Practitioner

## 2014-06-30 ENCOUNTER — Encounter: Payer: Self-pay | Admitting: Nurse Practitioner

## 2014-06-30 VITALS — BP 120/58 | HR 78 | Temp 98.8°F | Ht 66.0 in | Wt 185.0 lb

## 2014-06-30 DIAGNOSIS — L304 Erythema intertrigo: Secondary | ICD-10-CM | POA: Insufficient documentation

## 2014-06-30 MED ORDER — CLOTRIMAZOLE-BETAMETHASONE 1-0.05 % EX CREA: 1.0000 "application " | TOPICAL_CREAM | Freq: Every day | CUTANEOUS | Status: DC

## 2014-06-30 NOTE — Patient Instructions (Addendum)
Continue to use vinegar wash and steroid cream as needed.  Have Pap smear with gynecology in August.  Keep exercising.  Consider making appointment for physical for fasting labs.  Great to see you!

## 2014-06-30 NOTE — Progress Notes (Signed)
Pre visit review using our clinic review tool, if applicable. No additional management support is needed unless otherwise documented below in the visit note. 

## 2014-06-30 NOTE — Progress Notes (Signed)
Subjective:     Cindy Jones is a 24 y.o. female who presents for follow up of intertrigo bilat upper inner thighs. She was seen in office 3 weeks ago with Hx of 2 week rash inner thighs that had gotten worse after she started working out. She has been using lotrisone cream & vinegar washes as directed. She reports rash is greatly improved, but still has some dark discoloration of skin. Itching & redness have resolved.  The following portions of the patient's history were reviewed and updated as appropriate: allergies, current medications, past medical history, past social history, past surgical history and problem list.  Review of Systems Gastrointestinal: positive for occasional LLQ pain lasts few seconds. Genitourinary:negative for abnormal menstrual periods    Objective:    BP 120/58 mmHg  Pulse 78  Temp(Src) 98.8 F (37.1 C) (Oral)  Ht 5\' 6"  (1.676 m)  Wt 185 lb (83.915 kg)  BMI 29.87 kg/m2  SpO2 98%  LMP 06/01/2014 General:  alert, cooperative, appears stated age and no distress  Skin:  normal and rash resolved. Still has some post-inflammatory change.      Assessment:Plan   1. Intertrigo - clotrimazole-betamethasone (LOTRISONE) cream; Apply 1 application topically daily.  Dispense: 45 g; Refill: 0 Continue to use once daily for 30 days to help clear with post-inflamm changes. F/u PRN Schedule CPE at convenience for fasting labs.

## 2014-07-01 ENCOUNTER — Ambulatory Visit: Payer: Self-pay | Admitting: Nurse Practitioner

## 2014-07-16 ENCOUNTER — Ambulatory Visit (INDEPENDENT_AMBULATORY_CARE_PROVIDER_SITE_OTHER): Payer: 59 | Admitting: Psychiatry

## 2014-07-16 ENCOUNTER — Encounter (HOSPITAL_COMMUNITY): Payer: Self-pay | Admitting: Psychiatry

## 2014-07-16 VITALS — BP 117/76 | HR 76 | Ht 65.5 in | Wt 179.4 lb

## 2014-07-16 DIAGNOSIS — F900 Attention-deficit hyperactivity disorder, predominantly inattentive type: Secondary | ICD-10-CM

## 2014-07-16 DIAGNOSIS — F952 Tourette's disorder: Secondary | ICD-10-CM

## 2014-07-16 MED ORDER — AMPHETAMINE-DEXTROAMPHETAMINE 5 MG PO TABS: 5.0000 mg | ORAL_TABLET | Freq: Every day | ORAL | Status: DC

## 2014-07-16 MED ORDER — AMPHETAMINE-DEXTROAMPHET ER 20 MG PO CP24: 20.0000 mg | ORAL_CAPSULE | ORAL | Status: DC

## 2014-07-16 NOTE — Progress Notes (Signed)
Patient ID: Cindy Jones, female   DOB: Mar 01, 1991, 24 y.o.   MRN: [id number]  [facility] [id number] Progress Note  Cindy Jones [id number] 24 y.o.  07/16/2014 8:36 AM  Chief Complaint: doing good  History of Present Illness: Doing well overall. Work is getting better and she was given a new task and is handling it well. Pt is working with a Systems analyst to help with weight. Pt is no longer using OBC due to weight gain.  States she feels more stable emotionally.   After Adderall was increased to  she noticed an improvement in concentration but effect wears off at 3pm. She then takes  and it wears off by 8pm. Notes  tab in the afternoon is too strong and she can not sleep but doesn't have effect with . Sleep is good and she is getting about 7 hrs. No change in appetite and she is 3 decent size meals a day. Energy is good. Denies irritability and depression. Denies GI upset.   Tourettes is pretty much non-existant during the day. It centers around eye blinking (first symptom), chest tightening and flaring her nostrils. On days she is tired and stressed she has more symptoms.   Taking meds as prescribed and denies SE.   Suicidal Ideation: No Plan Formed: No Patient has means to carry out plan: No  Homicidal Ideation: No Plan Formed: No Patient has means to carry out plan: No  Review of Systems: Psychiatric: Agitation: No Hallucination: No Depressed Mood: No Insomnia: No Hypersomnia: No Altered Concentration: No Feels Worthless: No Grandiose Ideas: No Belief In Special Powers: No New/Increased Substance Abuse: No Compulsions: No  Neurologic: Headache: Yes Seizure: No Paresthesias: No  Review of Systems  Constitutional: Negative for fever and chills.  HENT: Negative for congestion, ear pain, nosebleeds and sore throat.   Eyes: Negative for blurred vision, double vision and pain.  Respiratory: Negative for cough, sputum production and  wheezing.   Cardiovascular: Negative for chest pain, palpitations and leg swelling.  Gastrointestinal: Negative for heartburn, nausea, vomiting and abdominal pain.  Musculoskeletal: Positive for back pain. Negative for joint pain and neck pain.  Skin: Negative for itching and rash.  Neurological: Positive for headaches. Negative for dizziness, sensory change, seizures, loss of consciousness and weakness.  Psychiatric/Behavioral: Negative for depression, suicidal ideas, hallucinations and substance abuse. The patient is not nervous/anxious and does not have insomnia.      Past Medical, Family, Social History: lives alone in [place]. Works at [employer]. Raised in [place] then in [place] in [place]. Father diagnosed with cancer when she was 5 and he passed away when she was 8yo. Mother became alcoholic after he passed. After he passed she went back and forth to her grandparents. Body image issues due to hyper focused step grandmother on weight. Family Members: mom and Twin sister. Close to paternal aunt and uncle.  reports that she has never smoked. She has never used smokeless tobacco. She reports that she drinks about 1.2 oz of alcohol per week. She reports that she does not use illicit drugs.  Family History  Problem Relation Age of Onset  . Alcohol abuse Mother   . Bipolar disorder Mother   . Cancer Father     sarcoma  . Tourette syndrome Maternal Aunt   . Alcohol abuse Maternal Aunt   . Hyperlipidemia Paternal Aunt   . Tourette syndrome Maternal Grandfather   . Heart disease Paternal Grandfather   . Alcohol abuse Maternal Uncle   .  Suicidality Neg Hx     Past Medical History  Diagnosis Date  . Frequent headaches   . Migraines   . Urinary tract bacterial infections   . Tourette disorder   . Binge eating disorder   . Seasonal allergies   . PID (acute pelvic inflammatory disease)      Outpatient Encounter Prescriptions as of 07/16/2014  Medication Sig  .  amphetamine-dextroamphetamine (ADDERALL XR) 20 MG 24 hr capsule Take 1 capsule (20 mg total) by mouth every morning.  [name] amphetamine-dextroamphetamine (ADDERALL) 10 MG tablet Take 1 tablet (10 mg total) by mouth daily with lunch.  . Multiple Vitamins-Minerals (MULTIVITAL PO) Take 1 tablet by mouth daily.  [name] aspirin-acetaminophen-caffeine (EXCEDRIN MIGRAINE) 250-250-65 MG per tablet Take 2 tablets by mouth every 6 (six) hours as needed for headache.  . clotrimazole-betamethasone (LOTRISONE) cream Apply 1 application topically daily. (Patient not taking: Reported on 07/16/2014)  . rizatriptan (MAXALT-MLT) 5 MG disintegrating tablet Take 1 tablet (5 mg total) by mouth as needed for migraine. May repeat in 2 hours if needed (Patient not taking: Reported on 04/16/2014)    Past Psychiatric History/Hospitalization(s): Anxiety: Yes Bipolar Disorder: No Depression: Yes Mania: No Psychosis: No Schizophrenia: No Personality Disorder: No Hospitalization for psychiatric illness: No History of Electroconvulsive Shock Therapy: No Prior Suicide Attempts: No  Physical Exam: Constitutional:  BP 117/76 mmHg  Pulse 76  Ht 5' 5.5" (1.664 m)  Wt 179 lb 6.4 oz (81.375 kg)  BMI 29.39 kg/m2  LMP 06/01/2014  General Appearance: alert, oriented, no acute distress and well nourished  Musculoskeletal: Strength & Muscle Tone: within normal limits Gait & Station: normal Patient leans: N/A  Mental Status Examination/Evaluation: Objective: Attitude: Calm and cooperative  Appearance: Fairly Groomed, appears to be stated age  Eye Contact::  Good  Speech:  Clear and Coherent and Normal Rate  Volume:  Normal  Mood:  euthymic  Affect:  Full Range  Thought Process:  Goal Directed, Linear and Logical  Orientation:  Full (Time, Place, and Person)  Thought Content:  Negative  Suicidal Thoughts:  No  Homicidal Thoughts:  No  Judgement:  Fair  Insight:  Fair  Concentration: good  Memory:  Immediate-good Recent-good Remote-good  Recall: fair  Language: fair  Gait and Station: normal  [employer] of Knowledge: average  Psychomotor Activity:  Normal  Akathisia:  No  Handed:  Right  AIMS (if indicated): n/a  Assets:  Manufacturing systems engineer Desire for Improvement Financial Resources/Insurance Housing Intimacy Leisure Time Physical Health Resilience Social Support Veterinary surgeon Decision Making (Choose Three): Established Problem, Stable/Improving (1), Review of Psycho-Social Stressors (1), Review of Medication Regimen & Side Effects (2) and Review of New Medication or Change in Dosage (2)  Assessment: AXIS I ADHD- inattentive type and Tourettes disorder. Hx of Bulemia  AXIS II Deferred  AXIS III Past Medical History  Diagnosis Date  . Frequent headaches   . Migraines   . Urinary tract bacterial infections   . Tourette disorder   . Binge eating disorder   . Seasonal allergies      AXIS IV economic problems and other psychosocial or environmental problems  AXIS V 51-60 moderate symptoms   Treatment Plan/Recommendations:  Plan of Care: Medication management with supportive therapy. Risks/benefits and SE of the medication discussed. Pt verbalized understanding and verbal consent obtained for treatment. Affirm with the patient that the medications are taken as ordered. Patient expressed understanding of how their medications were to  be used.    Pt will fax records from previous psychiatrist for review  Laboratory: Reviewed labs with pt- 12/24/2013 WNL; EKG sinus arrhythmia, NSTEMI  Psychotherapy: Therapy: brief supportive therapy provided. Discussed psychosocial stressors in detail.    Medications: change Adderall XR to 20mg  po qD and  Adderall 5mg  qLunch as needed for ADD  May consider adding small dose of Zyprexa for Tourettes in the future if needed  Routine  PRN Medications: No  Consultations: none at this time  Safety Concerns: Pt denies SI and is at an acute low risk for suicide.Patient told to call clinic if any problems occur. Patient advised to go to ER if they should develop SI/HI, side effects, or if symptoms worsen. Has crisis numbers to call if needed. Pt verbalized understanding.   Other: F/up in 3 months or sooner if needed    Oletta DarterAGARWAL, Radley Teston, MD 07/16/2014

## 2014-08-21 ENCOUNTER — Telehealth (HOSPITAL_COMMUNITY): Payer: Self-pay

## 2014-08-21 DIAGNOSIS — F900 Attention-deficit hyperactivity disorder, predominantly inattentive type: Secondary | ICD-10-CM

## 2014-08-21 DIAGNOSIS — F952 Tourette's disorder: Secondary | ICD-10-CM

## 2014-08-21 NOTE — Telephone Encounter (Signed)
Medication management - Patient left a message with request for both Adderall prescriptions.  Last written 07/16/14 and returns on 10/20/14.

## 2014-08-25 ENCOUNTER — Telehealth (HOSPITAL_COMMUNITY): Payer: Self-pay

## 2014-08-25 MED ORDER — AMPHETAMINE-DEXTROAMPHETAMINE 5 MG PO TABS: 5.0000 mg | ORAL_TABLET | Freq: Every day | ORAL | Status: DC

## 2014-08-25 MED ORDER — AMPHETAMINE-DEXTROAMPHET ER 20 MG PO CP24: 20.0000 mg | ORAL_CAPSULE | ORAL | Status: DC

## 2014-08-25 NOTE — Telephone Encounter (Signed)
08/25/14 1:33PM Patient came and pick-up rx script - [id number]

## 2014-08-25 NOTE — Telephone Encounter (Signed)
Called patient to inform 2 months of both Adderall prescriptions were ready for her to pick up.  Patient will return to see provider on 10/20/14 and call if any problems prior to that time.

## 2014-08-25 NOTE — Telephone Encounter (Signed)
Yes ok to refill. We can give her enough scripts to get to her next scheduled appt.

## 2014-09-17 ENCOUNTER — Encounter: Payer: Self-pay | Admitting: Nurse Practitioner

## 2014-09-17 ENCOUNTER — Ambulatory Visit (INDEPENDENT_AMBULATORY_CARE_PROVIDER_SITE_OTHER): Payer: 59 | Admitting: Nurse Practitioner

## 2014-09-17 VITALS — BP 106/68 | HR 73 | Temp 97.4°F | Ht 65.5 in | Wt 181.0 lb

## 2014-09-17 DIAGNOSIS — M25561 Pain in right knee: Secondary | ICD-10-CM

## 2014-09-17 NOTE — Progress Notes (Signed)
   Subjective:    Patient ID: Cindy Jones, female    DOB: 08/15/1990, 24 y.o.   MRN: [id number]  Knee Pain  The incident occurred 3 to 5 days ago. The incident occurred at the gym. Injury mechanism: hyperflexion, using weights. The pain is present in the right knee. The quality of the pain is described as aching. The pain is moderate. The pain has been intermittent since onset. Pertinent negatives include no inability to bear weight, loss of motion, loss of sensation, muscle weakness, numbness or tingling. Associated symptoms comments: Slight swelling . Exacerbated by: flexion & wt bearing if flexed more than 90 degrees. She has tried ice, NSAIDs, rest and heat for the symptoms. The treatment provided mild relief.      Review of Systems  Neurological: Negative for tingling and numbness.       Objective:   Physical Exam  Constitutional: She is oriented to person, place, and time. She appears well-developed and well-nourished. No distress.  HENT:  Head: Normocephalic and atraumatic.  Eyes: Conjunctivae are normal. Right eye exhibits no discharge. Left eye exhibits no discharge.  Cardiovascular: Normal rate.   No murmur heard. Pulmonary/Chest: Effort normal.  Musculoskeletal: She exhibits edema and tenderness.       Right knee: She exhibits swelling and effusion. She exhibits no deformity, no erythema, normal alignment, no LCL laxity, normal patellar mobility and no MCL laxity. Tenderness found. Patellar tendon tenderness noted.  FROM  Neurological: She is alert and oriented to person, place, and time.  Skin: Skin is warm and dry.  Psychiatric: She has a normal mood and affect. Her behavior is normal. Thought content normal.  Vitals reviewed.         Assessment & Plan:  1. Knee pain, acute, right - Ambulatory referral to Sports Medicine Wear knee sleeve Alternate ice & heat ibuprophen as duscussed Exercise limits as discussed See pt instructions

## 2014-09-17 NOTE — Patient Instructions (Signed)
Purchase knee sleeve. Get one that is snug, but not tight. It should fit 2 inches above & below knee. Wear when on feet. OK to take off when going to bed.  OK to exercise, but no inclines, tension, running, lunges, squats.  Take 400 mg ibuprophen 1-2 times daily. Use 10 minutes ice followed by 10 mins heat.  See Dr Katrinka BlazingSmith. Let me know if you have concerns.

## 2014-09-23 ENCOUNTER — Ambulatory Visit (INDEPENDENT_AMBULATORY_CARE_PROVIDER_SITE_OTHER): Payer: 59 | Admitting: Family Medicine

## 2014-09-23 ENCOUNTER — Encounter: Payer: Self-pay | Admitting: Family Medicine

## 2014-09-23 VITALS — BP 120/76 | HR 72 | Ht 66.0 in | Wt 170.0 lb

## 2014-09-23 DIAGNOSIS — S8991XA Unspecified injury of right lower leg, initial encounter: Secondary | ICD-10-CM | POA: Diagnosis not present

## 2014-09-23 DIAGNOSIS — M25561 Pain in right knee: Secondary | ICD-10-CM

## 2014-09-23 NOTE — Patient Instructions (Signed)
I'm concerned you tore meniscus in your knee. We will start with conservative treatment though if your knee locks call me as this would require surgery. Icing 15 minutes at a time 3-4 times a day. Ibuprofen 600mg  three times a day with food OR aleve 2 tabs twice a day with food for pain and inflammation. Knee sleeve or brace for support. Start physical therapy and do home exercises on days you don't go to therapy. Elevate as needed for swelling. Avoid deep squats, lunges, leg press. A cortisone shot is an option if pain is severe. Follow up with me in 4 weeks. If not improving would go ahead with MRI.

## 2014-09-24 DIAGNOSIS — S8991XA Unspecified injury of right lower leg, initial encounter: Secondary | ICD-10-CM | POA: Insufficient documentation

## 2014-09-24 NOTE — Assessment & Plan Note (Signed)
occurred with squatting.  Exam, effusion confirmed with ultrasound, mechanism all consistent with meniscus tear.  Ligament testing negative.  Will start with conservative treatment though discussed her intermittent catching is concerning she may need arthroscopy to achieve complete recovery.  Icing, nsaids, start PT and home exercises.  Avoid deep squats, lunges, leg press.  Consider injection.  F/u in 4 weeks.  If not improving would go ahead with MRI.

## 2014-09-24 NOTE — Progress Notes (Signed)
PCP and referred by: Kelle DartingWEAVER, LAYNE C, NP  Subjective:   HPI: Patient is a 24 y.o. female here for right knee pain.  Patient reports on 5/19 she was working out at [employer] gym - did a very deep squat and felt something in her knee - describes this as a tightness. + swelling. No locking, giving out but feels like it catches since then. Has remote history of ACL sprain but completely recovered from this. Has been taking aleve, using ice/heat.  Past Medical History  Diagnosis Date  . Frequent headaches   . Migraines   . Urinary tract bacterial infections   . Tourette disorder   . Binge eating disorder   . Seasonal allergies   . PID (acute pelvic inflammatory disease)     Current Outpatient Prescriptions on File Prior to Visit  Medication Sig Dispense Refill  . amphetamine-dextroamphetamine (ADDERALL XR) 20 MG 24 hr capsule Take 1 capsule (20 mg total) by mouth every morning. 30 capsule 0  . amphetamine-dextroamphetamine (ADDERALL) 5 MG tablet Take 1 tablet (5 mg total) by mouth daily with lunch. 30 tablet 0  . aspirin-acetaminophen-caffeine (EXCEDRIN MIGRAINE) 250-250-65 MG per tablet Take 2 tablets by mouth every 6 (six) hours as needed for headache.    . Multiple Vitamins-Minerals (MULTIVITAL PO) Take 1 tablet by mouth daily.    . rizatriptan (MAXALT-MLT) 5 MG disintegrating tablet Take 1 tablet (5 mg total) by mouth as needed for migraine. May repeat in 2 hours if needed (Patient not taking: Reported on 04/16/2014) 15 tablet 6   No current facility-administered medications on file prior to visit.    Past Surgical History  Procedure Laterality Date  . Tonsillectomy and adenoidectomy  2013    Allergies  Allergen Reactions  . Latex Swelling and Rash  . Naproxen   . Sulfa Antibiotics Hives and Swelling    History   Social History  . Marital Status: Single    Spouse Name: N/A  . Number of Children: 0  . Years of Education: N/A   Occupational History  . Not on file.    Social History Main Topics  . Smoking status: Never Smoker   . Smokeless tobacco: Never Used  . Alcohol Use: 1.2 oz/week    2 Glasses of wine per week  . Drug Use: No  . Sexual Activity: Yes    Birth Control/ Protection: None, Condom   Other Topics Concern  . Not on file   Social History Narrative   [name] recently graduated from [employer]. She works FT as a Patent attorneyloan broker. She attended boarding school in [place] when in HS. Her father died when she was 89 yo. Her mother is an alcoholic. Her aunt & uncle were her guardians when she was in HS. [place] live in [place], [place]. She has a fraternal twin sister.     Family History  Problem Relation Age of Onset  . Alcohol abuse Mother   . Bipolar disorder Mother   . Cancer Father     sarcoma  . Tourette syndrome Maternal Aunt   . Alcohol abuse Maternal Aunt   . Hyperlipidemia Paternal Aunt   . Tourette syndrome Maternal Grandfather   . Heart disease Paternal Grandfather   . Alcohol abuse Maternal Uncle   . Suicidality Neg Hx     BP 120/76 mmHg  Pulse 72  Ht 5\' 6"  (1.676 m)  Wt 170 lb (77.111 kg)  BMI 27.45 kg/m2  LMP 09/05/2014  Review of Systems: See  HPI above.    Objective:  Physical Exam:  Gen: NAD  Right knee: Mild effusion.  No other gross deformity, ecchymoses. TTP medial > lateral joint lines.  No post patellar facet tenderness. FROM. Negative ant/post drawers. Negative valgus/varus testing. Negative lachmanns. Positive mcmurrays, apleys, thessalys.  Negative patellar apprehension. NV intact distally.    Assessment & Plan:  1. Right knee injury - occurred with squatting.  Exam, effusion confirmed with ultrasound, mechanism all consistent with meniscus tear.  Ligament testing negative.  Will start with conservative treatment though discussed her intermittent catching is concerning she may need arthroscopy to achieve complete recovery.  Icing, nsaids, start PT and home exercises.  Avoid deep  squats, lunges, leg press.  Consider injection.  F/u in 4 weeks.  If not improving would go ahead with MRI.

## 2014-10-13 ENCOUNTER — Ambulatory Visit: Payer: 59 | Attending: Family Medicine | Admitting: Physical Therapy

## 2014-10-13 DIAGNOSIS — M6289 Other specified disorders of muscle: Secondary | ICD-10-CM

## 2014-10-13 DIAGNOSIS — G729 Myopathy, unspecified: Secondary | ICD-10-CM | POA: Insufficient documentation

## 2014-10-13 DIAGNOSIS — R29898 Other symptoms and signs involving the musculoskeletal system: Secondary | ICD-10-CM | POA: Insufficient documentation

## 2014-10-13 DIAGNOSIS — M25561 Pain in right knee: Secondary | ICD-10-CM | POA: Insufficient documentation

## 2014-10-13 NOTE — Patient Instructions (Signed)
HS stretch with strap or up against the wall as discussed.  3x 20 sec hold  Sidelying right hip abduction 15-20x  Ice and elevation to control swelling  Let pain be your guide, pain means stop.

## 2014-10-13 NOTE — Therapy (Signed)
[facility] [address] [place], [place], [address] Phone: [phone]   Fax:  [phone]  Physical Therapy Evaluation  Patient Details  Name: Cindy Jones MRN: [id number] Date of Birth: March 03, 1991 Referring Provider:  Lenda Kelp, MD  Encounter Date: 10/13/2014      PT End of Session - 10/13/14 1746    Visit Number 1   Number of Visits 16   Date for PT Re-Evaluation 12/08/14   Authorization Type [employer]   Authorization - Number of Visits 20   PT Start Time 1630   PT Stop Time 1725   PT Time Calculation (min) 55 min   Activity Tolerance Patient tolerated treatment well      Past Medical History  Diagnosis Date  . Frequent headaches   . Migraines   . Urinary tract bacterial infections   . Tourette disorder   . Binge eating disorder   . Seasonal allergies   . PID (acute pelvic inflammatory disease)     Past Surgical History  Procedure Laterality Date  . Tonsillectomy and adenoidectomy  2013    There were no vitals filed for this visit.  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  Right knee got really tight immediately.  Couldn't bend knee well following.  Got a sleeve brace she wears when she works out.  Avoiding resistance, squats and lunges.  Bike and Elliptical OK.  Painful with ascending stairs and jogging a lot.     Diagnostic tests U/S showed fluid 2 weeks after injury   Patient Stated Goals I want to work out again   Currently in Pain? Yes   Pain Score 0-No pain   Pain Location Knee   Pain Orientation Right   Pain Type Acute pain   Pain Onset More than a month  ago   Pain Frequency Intermittent   Aggravating Factors  full bending especially weight bearing; running for a while; up stairs   Pain Relieving Factors Advil or Alleve, ice, compression sleeve            [facility] PT Assessment - 10/13/14 1641    Assessment   Medical Diagnosis right knee pain   Onset Date/Surgical Date 09/17/14   Next MD Visit 6/20   Prior Therapy 24 years old   Precautions   Precautions --  no squats, no leg press, no lunges   Restrictions   Weight Bearing Restrictions No   Balance Screen   Has the patient fallen in the past 6 months No   Has the patient had a decrease in activity level because of a fear of falling?  No   Is the patient reluctant to leave their home because of a fear of falling?  No   Home Environment   Living Environment Private residence   Living Arrangements Alone   Type of Home Apartment   Home Access Stairs to enter   Home Layout One level   Prior Function   Level of Independence Independent   Vocation Full time employment   Vocation Requirements sitting   Leisure working out, [employer], walking "I didn't sit  much"   Observation/Other Assessments   Focus on Therapeutic Outcomes (FOTO)  49% limit   Posture/Postural Control   Posture Comments slight tendency for genu recurvatum; puffiness noted inferior patellar fat pad region   ROM / Strength   AROM / PROM / Strength AROM;Strength   AROM   AROM Assessment Site Knee   Right/Left Knee Right;Left   Right Knee Extension 0   Right Knee Flexion 136  pain at endrange   Left Knee Extension 0   Left Knee Flexion 135   Strength   Strength Assessment Site Knee;Hip   Right/Left Hip Right;Left   Right Hip ABduction 4/5   Left Hip ABduction 5/5   Right/Left Knee Right;Left   Right Knee Flexion 4+/5   Right Knee Extension 4/5   Left Knee Flexion 5/5   Left Knee Extension 5/5   Flexibility   Soft Tissue Assessment /Muscle Length yes   Hamstrings right 70 degrees, 90 on left    Quadriceps bilateral decreased length   Palpation   Patella mobility bogginess peri-patellar   Palpation comment pain with quad set in fat pad region   Special Tests    Special Tests Knee Special Tests   Knee Special tests  Lateral Pull Sign;Step-up/Step Down Test;other;other2   Lateral Pull Sign    Findings Positive   Side Right   Step-up/Step Down    Findings Positive   Side  Right   other    Findings Positive   Side  Right   Comments Mild discomfort with McMurray   other   findings Negative   Side Right   Comments joint line tenderness                           PT Education - 10/13/14 1739    Education provided Yes   Education Details HS stretch, sidelying hip abd, ice/elevation for swelling;  pain=stop   Person(s) Educated Patient   Methods Explanation;Demonstration   Comprehension Verbalized understanding          PT Short Term Goals - 10/13/14 1759    PT SHORT TERM GOAL #1   Title Patient will express understanding of RICE principle for swelling and pain control   Time 4   Period Weeks   Status New   PT SHORT TERM GOAL #2   Title Patient will have right HS length to 80 degrees needed for improved daily mobility   Time 4   Period Weeks   Status New   PT SHORT TERM GOAL #3   Title Patient will report an overall improvement in pain, reduction in pulling and catching sensation by 25%   Time 4   Period Weeks   Status New           PT Long Term Goals - 10/13/14 1800    PT LONG TERM GOAL #1   Title Patient will be independent in safe, self progression of HEP and gym program   Time 8   Period Weeks   Status New   PT LONG TERM GOAL #2   Title The patient will have 4+/5 hip abduction and knee extension strength on right   Time 8   Period Weeks   Status New   PT LONG TERM GOAL #3   Title The patient will have full right knee flexion without complaint of pain needed for normal home, work activities needed for ascend/descend flights of  stairs at home and work  Time 8   Period Weeks   Status New   PT LONG TERM GOAL #4   Title FOTO functional outcome score improved from 49% limitation to 29% indicating improved function with less pain   Time 8   Period Weeks   Status New               Plan - 10/13/14 1747    Clinical Impression Statement The patient is an active 24 year old who enjoys exercising at the gym, jogging, hiking and kayaking.  About a month ago, she was at the gym doing deep squats when she felt a tightness/pain in her right knee.  Since then she has discomfort in her inferior as well as medial/lateral aspects of the knee with endrange flexion, running and sometimes ascending or descending stairs.  She reports frequent "catching" in her knee up to 10x/day.  She continues to do the bike and Elliptical at the gym but has been avoiding deep squats, lunges and leg press per MD recommendations.  She does admit she has been trying to run a little and states she is sore today from 3 miles yesterday.  Today she has mild right inferior knee (fat pad region puffiness).  Slight tendency toward genu recurvatum bilaterally.  AROM is symmetrical 0-135 degrees but with discomfort with endrange flexion.  Right HS 70 degrees, 90 degrees on left.  Decreased right hip abd 4/5, knee ext 4/5, knee flex 4+/5.  Discomfort with McMurray, negative for ACL and collateral ligament stress tests.  Negative joint line tenderness    Pt will benefit from skilled therapeutic intervention in order to improve on the following deficits Pain;Impaired flexibility;Decreased strength   Rehab Potential Good   PT Frequency 2x / week   PT Duration 8 weeks   PT Treatment/Interventions ADLs/Self Care Home Management;Cryotherapy;Electrical Stimulation;Iontophoresis 4mg /ml Dexamethasone;Ultrasound;Therapeutic exercise;Neuromuscular re-education;Patient/family education;Manual techniques;Taping;Dry needling  ionto requested on cert   PT Next Visit Plan  Taping (kinesio or McConnell fat pad release); open chain hip and knee muscle strengthening initially with gradual progression toward close chain based on symptoms; vasocompression or cryotherapy         Problem List Patient Active Problem List   Diagnosis Date Noted  . Right knee injury 09/24/2014  . Attention deficit hyperactivity disorder (ADHD), predominantly inattentive type 04/16/2014  . ADD (attention deficit disorder) 01/15/2014  . Proteinuria 12/24/2013  . Migraine, unspecified, without mention of intractable migraine without mention of status migrainosus 12/02/2013  . PID (acute pelvic inflammatory disease) 11/24/2013  . Routine screening for STI (sexually transmitted infection) 10/29/2013  . Tourette disease 10/18/2013  . Abnormal neurological exam 10/18/2013    Vivien Presto 10/13/2014, 6:07 PM  [facility] [address] [place], [place], [address] Phone: [phone]   Fax:  [phone]    Lavinia Sharps, PT 10/13/2014 6:07 PM Phone: [phone] Fax: [phone]

## 2014-10-14 ENCOUNTER — Telehealth (HOSPITAL_COMMUNITY): Payer: Self-pay

## 2014-10-14 NOTE — Telephone Encounter (Signed)
Telephone call with patient after she left a message requesting a refill of her 2 different Adderall prescriptions.  Patient stated she was not sure Dr. Michae Kava was still seeing patient's or if had already gone out on maternity leave.  Patient informed she is still scheduled for 10/20/14 at 8:30am as Dr. Michae Kava has not gone out yet and it is too early for new prescriptions as patient was given orders to fill on or after 09/23/14.  Informed patient if Dr. Michae Kava went out prior to her appointment we would call to reschedule as patient states she leaves on vacation right after 10/20/14 and just wants to make sure she has medications when she leaves.  Patient stated plan to keep appointment still set currently for 10/20/14 and will call back as needed.

## 2014-10-16 ENCOUNTER — Ambulatory Visit: Payer: 59 | Admitting: Physical Therapy

## 2014-10-19 ENCOUNTER — Encounter: Payer: Self-pay | Admitting: Family Medicine

## 2014-10-19 ENCOUNTER — Ambulatory Visit (INDEPENDENT_AMBULATORY_CARE_PROVIDER_SITE_OTHER): Payer: 59 | Admitting: Family Medicine

## 2014-10-19 VITALS — BP 120/77 | HR 59 | Ht 66.0 in | Wt 170.0 lb

## 2014-10-19 DIAGNOSIS — S8991XD Unspecified injury of right lower leg, subsequent encounter: Secondary | ICD-10-CM

## 2014-10-19 NOTE — Patient Instructions (Signed)
Continue with the physical therapy - this is the most important part of treatment. Do home exercises on days you don't go to therapy. If the catching doesn't improve and pain doesn't continue to stay low (or go away) by follow up will go ahead with an MRI to confirm medial meniscus tear and likely arthroscopy. Take aleve only as needed now. Follow up with me the first week of august.

## 2014-10-20 ENCOUNTER — Ambulatory Visit (INDEPENDENT_AMBULATORY_CARE_PROVIDER_SITE_OTHER): Payer: 59 | Admitting: Psychiatry

## 2014-10-20 ENCOUNTER — Encounter (HOSPITAL_COMMUNITY): Payer: Self-pay | Admitting: Psychiatry

## 2014-10-20 VITALS — BP 122/72 | HR 76 | Ht 66.0 in | Wt 177.4 lb

## 2014-10-20 DIAGNOSIS — F900 Attention-deficit hyperactivity disorder, predominantly inattentive type: Secondary | ICD-10-CM

## 2014-10-20 DIAGNOSIS — F952 Tourette's disorder: Secondary | ICD-10-CM

## 2014-10-20 MED ORDER — AMPHETAMINE-DEXTROAMPHET ER 20 MG PO CP24
20.0000 mg | ORAL_CAPSULE | Freq: Every day | ORAL | Status: DC
Start: 2014-10-20 — End: 2015-01-19

## 2014-10-20 MED ORDER — AMPHETAMINE-DEXTROAMPHET ER 20 MG PO CP24: 20.0000 mg | ORAL_CAPSULE | ORAL | Status: DC

## 2014-10-20 MED ORDER — AMPHETAMINE-DEXTROAMPHETAMINE 5 MG PO TABS: 5.0000 mg | ORAL_TABLET | Freq: Every day | ORAL | Status: DC

## 2014-10-20 MED ORDER — AMPHETAMINE-DEXTROAMPHETAMINE 5 MG PO TABS
5.0000 mg | ORAL_TABLET | Freq: Every day | ORAL | Status: DC
Start: 2014-10-20 — End: 2015-01-19

## 2014-10-20 NOTE — Progress Notes (Signed)
[facility] [address] Progress Note  Cindy Jones [id number] 24 y.o.  10/20/2014 8:40 AM  Chief Complaint: ok  History of Present Illness: Doing well overall. Work is getting better now that the temp has left for maternity leave. The temp threatened pt and pt has reported her to HR and the temp was put on probation. Pt likes her job when the temp is not there. This situation caused her a lot of anxiety and made her feel upset.   Pt is on vacation this week and plans to go to the beach.  Pt is working with a Systems analyst to help with weight. Pt tore the meniscus in her knee. Pt is starting PT soon.   States she feels more stable emotionally.   Adderall is working well and concentration is good. Sleep is good and she is getting about 7 hrs. No change in appetite and she is 3 decent size meals a day. Energy is good. Denies irritability and depression. Denies GI upset and HA.   Tourettes is pretty much non-existant during the day. It centers around eye blinking (first symptom), chest tightening and flaring her nostrils. On days she is tired and stressed she has more symptoms.   Taking meds as prescribed and denies SE.   Suicidal Ideation: No Plan Formed: No Patient has means to carry out plan: No  Homicidal Ideation: No Plan Formed: No Patient has means to carry out plan: No  Review of Systems: Psychiatric: Agitation: No Hallucination: No Depressed Mood: No Insomnia: No Hypersomnia: No Altered Concentration: No Feels Worthless: No Grandiose Ideas: No Belief In Special Powers: No New/Increased Substance Abuse: No Compulsions: No  Neurologic: Headache: No Seizure: No Paresthesias: No  Review of Systems  Constitutional: Negative for fever and chills.  HENT: Negative for congestion, ear pain, nosebleeds and sore throat.   Eyes: Negative for blurred vision, double vision and pain.  Respiratory: Negative for cough, sputum production and wheezing.    Cardiovascular: Negative for chest pain, palpitations and leg swelling.  Gastrointestinal: Negative for heartburn, nausea, vomiting and abdominal pain.  Musculoskeletal: Positive for joint pain. Negative for back pain and neck pain.  Skin: Negative for itching and rash.  Neurological: Negative for dizziness, sensory change, seizures, loss of consciousness, weakness and headaches.  Psychiatric/Behavioral: Negative for depression, suicidal ideas, hallucinations and substance abuse. The patient is not nervous/anxious and does not have insomnia.      Past Medical, Family, Social History: lives alone in [place]. Works at [employer]. Raised in [place] then in [place] in [place]. Father diagnosed with cancer when she was 5 and he passed away when she was 8yo. Mother became alcoholic after he passed. After he passed she went back and forth to her grandparents. Body image issues due to hyper focused step grandmother on weight. Family Members: mom and Twin sister. Close to paternal aunt and uncle.  reports that she has never smoked. She has never used smokeless tobacco. She reports that she drinks about 1.2 oz of alcohol per week. She reports that she does not use illicit drugs.  Family History  Problem Relation Age of Onset  . Alcohol abuse Mother   . Bipolar disorder Mother   . Cancer Father     sarcoma  . Tourette syndrome Maternal Aunt   . Alcohol abuse Maternal Aunt   . Hyperlipidemia Paternal Aunt   . Tourette syndrome Maternal Grandfather   . Heart disease Paternal Grandfather   . Alcohol abuse Maternal Uncle   .  Suicidality Neg Hx     Past Medical History  Diagnosis Date  . Frequent headaches   . Migraines   . Urinary tract bacterial infections   . Tourette disorder   . Binge eating disorder   . Seasonal allergies   . PID (acute pelvic inflammatory disease)      Outpatient Encounter Prescriptions as of 10/20/2014  Medication Sig  . amphetamine-dextroamphetamine (ADDERALL XR)  20 MG 24 hr capsule Take 1 capsule (20 mg total) by mouth every morning.  [name] amphetamine-dextroamphetamine (ADDERALL) 5 MG tablet Take 1 tablet (5 mg total) by mouth daily with lunch.  [name] aspirin-acetaminophen-caffeine (EXCEDRIN MIGRAINE) 250-250-65 MG per tablet Take 2 tablets by mouth every 6 (six) hours as needed for headache.  . Multiple Vitamins-Minerals (MULTIVITAL PO) Take 1 tablet by mouth daily.  [name] Penicillamine 250 MG TABS Take 500 mg by mouth.  . [DISCONTINUED] rizatriptan (MAXALT-MLT) 5 MG disintegrating tablet Take 1 tablet (5 mg total) by mouth as needed for migraine. May repeat in 2 hours if needed (Patient not taking: Reported on 04/16/2014)   No facility-administered encounter medications on file as of 10/20/2014.    Past Psychiatric History/Hospitalization(s): Anxiety: Yes Bipolar Disorder: No Depression: Yes Mania: No Psychosis: No Schizophrenia: No Personality Disorder: No Hospitalization for psychiatric illness: No History of Electroconvulsive Shock Therapy: No Prior Suicide Attempts: No  Physical Exam: Constitutional:  BP 122/72 mmHg  Pulse 76  Ht 5\' 6"  (1.676 m)  Wt 177 lb 6.4 oz (80.468 kg)  BMI 28.65 kg/m2  General Appearance: alert, oriented, no acute distress and well nourished  Musculoskeletal: Strength & Muscle Tone: within normal limits Gait & Station: normal Patient leans: N/A  Mental Status Examination/Evaluation: Objective: Attitude: Calm and cooperative  Appearance: Fairly Groomed, appears to be stated age  Eye Contact::  Good  Speech:  Clear and Coherent and Normal Rate  Volume:  Normal  Mood:  euthymic  Affect:  Full Range  Thought Process:  Goal Directed, Linear and Logical  Orientation:  Full (Time, Place, and Person)  Thought Content:  Negative  Suicidal Thoughts:  No  Homicidal Thoughts:  No  Judgement:  Fair  Insight:  Fair  Concentration: good  Memory: Immediate-good Recent-good Remote-good  Recall: fair  Language: fair   Gait and Station: normal  [employer] of Knowledge: average  Psychomotor Activity:  Normal  Akathisia:  No  Handed:  Right  AIMS (if indicated): n/a  Assets:  Manufacturing systems engineer Desire for Improvement Financial Resources/Insurance Housing Intimacy Leisure Time Physical Health Resilience Social Support Veterinary surgeon Decision Making (Choose Three): Established Problem, Stable/Improving (1), Review of Psycho-Social Stressors (1), Review of Medication Regimen & Side Effects (2) and Review of New Medication or Change in Dosage (2)  Assessment: AXIS I ADHD- inattentive type and Tourettes disorder. Hx of Bulemia  AXIS II Deferred  AXIS III Past Medical History  Diagnosis Date  . Frequent headaches   . Migraines   . Urinary tract bacterial infections   . Tourette disorder   . Binge eating disorder   . Seasonal allergies      AXIS IV economic problems and other psychosocial or environmental problems  AXIS V 51-60 moderate symptoms   Treatment Plan/Recommendations:  Plan of Care: Medication management with supportive therapy. Risks/benefits and SE of the medication discussed. Pt verbalized understanding and verbal consent obtained for treatment. Affirm with the patient that the medications are taken as ordered. Patient expressed understanding of how  their medications were to be used.    Pt will fax records from previous psychiatrist for review  Laboratory: Reviewed labs with pt- 12/24/2013 WNL; EKG sinus arrhythmia, NSTEMI  Psychotherapy: Therapy: brief supportive therapy provided. Discussed psychosocial stressors in detail.    Medications: Adderall XR   po qD and  Adderall  qLunch as needed for ADD  May consider adding small dose of Zyprexa for Tourettes in the future if needed  Routine PRN Medications: No  Consultations: none at this time  Safety Concerns: Pt  denies SI and is at an acute low risk for suicide.Patient told to call clinic if any problems occur. Patient advised to go to ER if they should develop SI/HI, side effects, or if symptoms worsen. Has crisis numbers to call if needed. Pt verbalized understanding.   Other: F/up in 5 months or sooner if needed    Oletta Darter, MD 10/20/2014

## 2014-10-21 NOTE — Assessment & Plan Note (Signed)
occurred with squatting.  Exam, effusion again confirmed with ultrasound, mechanism all consistent with meniscus tear.  She has had some improvement since last visit - catching is improving also.  Advised we continue with conservative treatment for medial meniscus tear.  If still not improving over another 5-6 weeks would go ahead with MRI, likely arthroscopy.

## 2014-10-21 NOTE — Progress Notes (Signed)
PCP and referred by: Kelle Darting, NP  Subjective:   HPI: Patient is a 24 y.o. female here for right knee pain.  5/25: Patient reports on 5/19 she was working out at [employer] - did a very deep squat and felt something in her knee - describes this as a tightness. + swelling. No locking, giving out but feels like it catches since then. Has remote history of ACL sprain but completely recovered from this. Has been taking aleve, using ice/heat.  6/20: Patient reports she is about 50% improved. Pain worse after a lot of walking. Slight swelling also with a lot of walking. Some improvement with catching. Not locking or giving out though. No new injuries.  Past Medical History  Diagnosis Date  . Frequent headaches   . Migraines   . Urinary tract bacterial infections   . Tourette disorder   . Binge eating disorder   . Seasonal allergies   . PID (acute pelvic inflammatory disease)     Current Outpatient Prescriptions on File Prior to Visit  Medication Sig Dispense Refill  . aspirin-acetaminophen-caffeine (EXCEDRIN MIGRAINE) 250-250-65 MG per tablet Take 2 tablets by mouth every 6 (six) hours as needed for headache.    . Multiple Vitamins-Minerals (MULTIVITAL PO) Take 1 tablet by mouth daily.     No current facility-administered medications on file prior to visit.    Past Surgical History  Procedure Laterality Date  . Tonsillectomy and adenoidectomy  2013    Allergies  Allergen Reactions  . Latex Swelling and Rash  . Naproxen   . Sulfa Antibiotics Hives and Swelling    History   Social History  . Marital Status: Single    Spouse Name: N/A  . Number of Children: 0  . Years of Education: N/A   Occupational History  . Not on file.   Social History Main Topics  . Smoking status: Never Smoker   . Smokeless tobacco: Never Used  . Alcohol Use: 1.2 oz/week    2 Glasses of wine per week  . Drug Use: No  . Sexual Activity: Yes    Birth Control/ Protection: None,  Condom   Other Topics Concern  . Not on file   Social History Narrative   Ms. Phill Myron recently graduated from [employer]. She works FT as a Patent attorney. She attended boarding school in [place] when in HS. Her father died when she was 1 yo. Her mother is an alcoholic. Her aunt & uncle were her guardians when she was in HS. They live in [place], [place]. She has a fraternal twin sister.     Family History  Problem Relation Age of Onset  . Alcohol abuse Mother   . Bipolar disorder Mother   . Cancer Father     sarcoma  . Tourette syndrome Maternal Aunt   . Alcohol abuse Maternal Aunt   . Hyperlipidemia Paternal Aunt   . Tourette syndrome Maternal Grandfather   . Heart disease Paternal Grandfather   . Alcohol abuse Maternal Uncle   . Suicidality Neg Hx     BP 120/77 mmHg  Pulse 59  Ht 5\' 6"  (1.676 m)  Wt 170 lb (77.111 kg)  BMI 27.45 kg/m2  Review of Systems: See HPI above.    Objective:  Physical Exam:  Gen: NAD  Right knee: Mild effusion.  No other gross deformity, ecchymoses. TTP medial joint line.  No post patellar facet tenderness. FROM. Negative ant/post drawers. Negative valgus/varus testing. Negative lachmanns. Positive mcmurrays, apleys,  thessalys.  Negative patellar apprehension. NV intact distally.    Assessment & Plan:  1. Right knee injury - occurred with squatting.  Exam, effusion again confirmed with ultrasound, mechanism all consistent with meniscus tear.  She has had some improvement since last visit - catching is improving also.  Advised we continue with conservative treatment for medial meniscus tear.  If still not improving over another 5-6 weeks would go ahead with MRI, likely arthroscopy.

## 2014-11-06 ENCOUNTER — Telehealth: Payer: Self-pay | Admitting: Physical Therapy

## 2014-11-06 ENCOUNTER — Ambulatory Visit (INDEPENDENT_AMBULATORY_CARE_PROVIDER_SITE_OTHER): Payer: 59 | Admitting: Family Medicine

## 2014-11-06 ENCOUNTER — Ambulatory Visit: Payer: 59 | Attending: Family Medicine | Admitting: Physical Therapy

## 2014-11-06 VITALS — BP 116/78 | HR 85 | Temp 98.4°F | Resp 20 | Ht 65.5 in | Wt 175.0 lb

## 2014-11-06 DIAGNOSIS — J301 Allergic rhinitis due to pollen: Secondary | ICD-10-CM

## 2014-11-06 DIAGNOSIS — G729 Myopathy, unspecified: Secondary | ICD-10-CM | POA: Insufficient documentation

## 2014-11-06 DIAGNOSIS — R590 Localized enlarged lymph nodes: Secondary | ICD-10-CM | POA: Diagnosis not present

## 2014-11-06 DIAGNOSIS — J0101 Acute recurrent maxillary sinusitis: Secondary | ICD-10-CM | POA: Diagnosis not present

## 2014-11-06 DIAGNOSIS — M25561 Pain in right knee: Secondary | ICD-10-CM | POA: Insufficient documentation

## 2014-11-06 DIAGNOSIS — R29898 Other symptoms and signs involving the musculoskeletal system: Secondary | ICD-10-CM | POA: Insufficient documentation

## 2014-11-06 MED ORDER — AMOXICILLIN-POT CLAVULANATE 875-125 MG PO TABS: 1.0000 | ORAL_TABLET | Freq: Two times a day (BID) | ORAL | Status: DC

## 2014-11-06 MED ORDER — FLUTICASONE PROPIONATE 50 MCG/ACT NA SUSP: 2.0000 | Freq: Every day | NASAL | Status: DC

## 2014-11-06 MED ORDER — LORATADINE 10 MG PO TABS: 10.0000 mg | ORAL_TABLET | Freq: Every day | ORAL | Status: DC

## 2014-11-06 NOTE — Telephone Encounter (Signed)
Left message regarding missed appt this morning and next appt on 7/11.

## 2014-11-06 NOTE — Progress Notes (Signed)
Subjective:    Patient ID: Cindy Jones, female    DOB: 10-03-1990, 24 y.o.   MRN: [id number]  11/06/2014  Sinusitis and Ear Pain   HPI This 24 y.o. female presents for evaluation of sinus congestion. Onset five days ago.  +feverish; +felt warm; +chills/sweats; Feels horrible.  +HA +sinus pressure.  +ear pain B; +teeth pain. Muffled hearing.  +nasal congestion ;+rhinorrhea; +yellowish and bloody.  +PND.  Mild cough; some SOB due to nasal congestion.  +nausea; no vomiting or diarrhea.  +ST; s/p tonsillectomy.  Pain with swallowing due to drainage.  Dayquil and Nyquil.    Suffers with recurrent sinusitis; s/p tonsillectomy and adenoidectomy. No previous allergy testings. No pets. Lives in [address]; old apartments.   Swollen lymph nodes frequently; hard submandibular lymph node.  Review of Systems  Constitutional: Positive for fever, chills, diaphoresis and fatigue.  HENT: Positive for congestion, ear pain, hearing loss, postnasal drip, rhinorrhea, sinus pressure and sore throat.   Respiratory: Positive for cough. Negative for shortness of breath and wheezing.   Gastrointestinal: Negative for nausea, vomiting, abdominal pain and diarrhea.  Neurological: Positive for headaches.  Hematological: Positive for adenopathy.    Past Medical History  Diagnosis Date  . Frequent headaches   . Migraines   . Urinary tract bacterial infections   . Tourette disorder   . Binge eating disorder   . Seasonal allergies   . PID (acute pelvic inflammatory disease)    Past Surgical History  Procedure Laterality Date  . Tonsillectomy and adenoidectomy  2013   Allergies  Allergen Reactions  . Latex Swelling and Rash  . Naproxen   . Sulfa Antibiotics Hives and Swelling   Social History   Social History  . Marital Status: Single    Spouse Name: N/A  . Number of Children: 0  . Years of Education: N/A   Occupational History  . Not on file.   Social History Main Topics  .  Smoking status: Never Smoker   . Smokeless tobacco: Never Used  . Alcohol Use: 1.2 oz/week    2 Glasses of wine per week  . Drug Use: No  . Sexual Activity: Yes    Birth Control/ Protection: None, Condom   Other Topics Concern  . Not on file   Social History Narrative   Cindy Jones recently graduated from [employer]. She works FT as a Patent attorney. She attended boarding school in [place] when in HS. Her father died when she was 78 yo. Her mother is an alcoholic. Her aunt & uncle were her guardians when she was in HS. They live in [place], [place]. She has a fraternal twin sister.         Objective:    BP 116/78 mmHg  Pulse 85  Temp(Src) 98.4 F (36.9 C) (Oral)  Resp 20  Ht 5' 5.5" (1.664 m)  Wt 175 lb (79.379 kg)  BMI 28.67 kg/m2  SpO2 99%  LMP 11/03/2014 Physical Exam  Constitutional: She is oriented to person, place, and time. She appears well-developed and well-nourished. No distress.  HENT:  Head: Normocephalic and atraumatic.  Right Ear: Tympanic membrane, external ear and ear canal normal.  Left Ear: Tympanic membrane, external ear and ear canal normal.  Nose: Right sinus exhibits maxillary sinus tenderness. Right sinus exhibits no frontal sinus tenderness. Left sinus exhibits maxillary sinus tenderness. Left sinus exhibits no frontal sinus tenderness.  Mouth/Throat: Oropharynx is clear and moist.  Eyes: Conjunctivae are normal. Pupils are  equal, round, and reactive to light.  Neck: Normal range of motion. Neck supple.  Cardiovascular: Normal rate, regular rhythm and normal heart sounds.  Exam reveals no gallop and no friction rub.   No murmur heard. Pulmonary/Chest: Effort normal and breath sounds normal. She has no wheezes. She has no rales.  Lymphadenopathy:       Head (right side): Submandibular adenopathy present. No preauricular and no posterior auricular adenopathy present.       Head (left side): Submandibular adenopathy present. No preauricular and  no posterior auricular adenopathy present.       Right cervical: No superficial cervical, no deep cervical and no posterior cervical adenopathy present.      Left cervical: No superficial cervical, no deep cervical and no posterior cervical adenopathy present.  Neurological: She is alert and oriented to person, place, and time.  Skin: She is not diaphoretic.  Psychiatric: She has a normal mood and affect. Her behavior is normal.  Nursing note and vitals reviewed.       Assessment & Plan:   1. Acute recurrent maxillary sinusitis   2. Allergic rhinitis due to pollen   3. LAD (lymphadenopathy), submandibular    -New. -Rx for Augmentin, Flonase, Claritin provided. -Expect lymphadenopathy to resolve in upcoming three weeks; if persists, RTC.    Meds ordered this encounter  Medications  . amoxicillin-clavulanate (AUGMENTIN) 875-125 MG per tablet    Sig: Take 1 tablet by mouth 2 (two) times daily.    Dispense:  20 tablet    Refill:  0  . fluticasone (FLONASE) 50 MCG/ACT nasal spray    Sig: Place 2 sprays into both nostrils daily.    Dispense:  16 g    Refill:  6  . loratadine (CLARITIN) 10 MG tablet    Sig: Take 1 tablet (10 mg total) by mouth daily.    Dispense:  30 tablet    Refill:  11    No Follow-up on file.   Kristi Paulita FujitaMartin Smith, M.D. [facility]   [address] [place], [place]  [address] [phone] phone [phone] fax

## 2014-11-06 NOTE — Patient Instructions (Signed)

## 2014-11-09 ENCOUNTER — Ambulatory Visit: Payer: 59 | Admitting: Physical Therapy

## 2014-11-09 DIAGNOSIS — M25561 Pain in right knee: Secondary | ICD-10-CM

## 2014-11-09 DIAGNOSIS — G729 Myopathy, unspecified: Secondary | ICD-10-CM | POA: Diagnosis present

## 2014-11-09 DIAGNOSIS — M6289 Other specified disorders of muscle: Secondary | ICD-10-CM

## 2014-11-09 DIAGNOSIS — R29898 Other symptoms and signs involving the musculoskeletal system: Secondary | ICD-10-CM | POA: Diagnosis not present

## 2014-11-09 NOTE — Therapy (Signed)
[facility] [address] [place], [place], [address] Phone: [phone]   Fax:  [phone]  Physical Therapy Treatment  Patient Details  Name: Cindy Jones MRN: [id number] Date of Birth: [birth year] Referring Provider:  Kelle Darting, NP  Encounter Date: 11/09/2014      PT End of Session - 11/09/14 1745    Visit Number 2   Number of Visits 16   Date for PT Re-Evaluation 12/08/14   PT Start Time 1634   PT Stop Time 1735   PT Time Calculation (min) 61 min   Activity Tolerance Patient tolerated treatment well      Past Medical History  Diagnosis Date  . Frequent headaches   . Migraines   . Urinary tract bacterial infections   . Tourette disorder   . Binge eating disorder   . Seasonal allergies   . PID (acute pelvic inflammatory disease)     Past Surgical History  Procedure Laterality Date  . Tonsillectomy and adenoidectomy  2013    There were no vitals filed for this visit.  Visit Diagnosis:  Knee pain, acute, right  Weakness of right lower extremity  Muscle tightness      Subjective Assessment - 11/09/14 1637    Subjective Has been doing her exercises.  3/10 pain when she workes out a  lot. Able to hike 3-4 miles .  No pain with walking, started hurting when she stopped.  Was not with her trainer when she hurt her knee. Wears compression sleeve when she exercises. (Not wearing today)     Currently in Pain? Yes   Pain Score 3    Pain Location Knee   Pain Orientation Right;Anterior;Lateral;Medial   Pain Descriptors / Indicators Aching   Aggravating Factors  hiking incline, sitting a long time especially the day after exercising.    Pain Relieving Factors avoiding squats. ice, rest elevating.                           [facility] Adult PT Treatment/Exercise - 11/09/14 1650    Self-Care   Self-Care RICE   Other Self-Care Comments  Shoes, warm ups, avoiding pain, how to stretch,    Knee/Hip Exercises: Stretches   Passive Hamstring Stretch 3 reps;30 seconds  bought stretch strap for home   Passive Hamstring Stretch Limitations 79 degrees ROM RT   Quad Stretch 3 reps;30 seconds  both   Gastroc Stretch 3 reps;30 seconds  step, cues initially   Knee/Hip Exercises: Sidelying   Hip ABduction 10 reps  cues knee/foot position, technique, breathing   Clams 10 reps   cues   Vasopneumatic   Number Minutes Vasopneumatic  15 minutes   Vasopnuematic Location  Knee   Vasopneumatic Pressure High  patient's request   Vasopneumatic Temperature  32 degrees                PT Education - 11/09/14 1749    Education provided Yes   Education Details how edema affects quad strength, how hamstring length affects pressure anterior knee.   How iontophoresis works.  Pain free exercise    Person(s) Educated Patient   Methods Explanation          PT Short Term Goals - 10/13/14 1759    PT SHORT TERM GOAL #1   Title Patient will express understanding of RICE principle for swelling and pain control   Time 4   Period Weeks   Status  New   PT SHORT TERM GOAL #2   Title Patient will have right HS length to 80 degrees needed for improved daily mobility   Time 4   Period Weeks   Status New   PT SHORT TERM GOAL #3   Title Patient will report an overall improvement in pain, reduction in pulling and catching sensation by 25%   Time 4   Period Weeks   Status New           PT Long Term Goals - 10/13/14 1800    PT LONG TERM GOAL #1   Title Patient will be independent in safe, self progression of HEP and gym program   Time 8   Period Weeks   Status New   PT LONG TERM GOAL #2   Title The patient will have 4+/5 hip abduction and knee extension strength on right   Time 8   Period Weeks   Status New   PT LONG TERM GOAL #3   Title The patient will have full right knee flexion without complaint of pain needed for normal home, work activities needed for ascend/descend  flights of stairs at home and work   Time 8   Period Weeks   Status New   PT LONG TERM GOAL #4   Title FOTO functional outcome score improved from 49% limitation to 29% indicating improved function with less pain   Time 8   Period Weeks   Status New               Plan - 11/09/14 1745    Clinical Impression Statement Patient Hamstring ROM improving .  Progress toward STG#2.  ROM much improved tho not measured.  Less catching reported 1 catch during a 3-4 mile hike with a foot plant and turn.      Patient to check with her insurance company to see if it covers iontophoresis. No pain post session.    Problem List Patient Active Problem List   Diagnosis Date Noted  . Right knee injury 09/24/2014  . Attention deficit hyperactivity disorder (ADHD), predominantly inattentive type 04/16/2014  . ADD (attention deficit disorder) 01/15/2014  . Proteinuria 12/24/2013  . Migraine, unspecified, without mention of intractable migraine without mention of status migrainosus 12/02/2013  . PID (acute pelvic inflammatory disease) 11/24/2013  . Routine screening for STI (sexually transmitted infection) 10/29/2013  . Tourette disease 10/18/2013  . Abnormal neurological exam 10/18/2013    [facility] IncARRIS,Baltazar Pekala 11/09/2014, 5:58 PM  [facility] [address] [place], [place], [address] Phone: [phone]   Fax:  [phone]     Liz BeachKaren Karington Zarazua, PTA 11/09/2014 5:58 PM Phone: [phone] Fax: [phone]

## 2014-11-13 ENCOUNTER — Ambulatory Visit: Payer: 59 | Admitting: Physical Therapy

## 2014-11-16 ENCOUNTER — Ambulatory Visit: Payer: 59 | Admitting: Physical Therapy

## 2014-11-16 DIAGNOSIS — M6289 Other specified disorders of muscle: Secondary | ICD-10-CM

## 2014-11-16 DIAGNOSIS — R29898 Other symptoms and signs involving the musculoskeletal system: Secondary | ICD-10-CM | POA: Diagnosis not present

## 2014-11-16 DIAGNOSIS — M25561 Pain in right knee: Secondary | ICD-10-CM

## 2014-11-16 NOTE — Therapy (Signed)
[place], [place], [address] Phone: [phone]   Fax:  [phone]  Physical Therapy Treatment  Patient Details  Name: Cindy Jones MRN: [id number] Date of Birth: 27-Jul-1990 Referring Provider:  Irene Pap, NP  Encounter Date: 11/16/2014      PT End of Session - 11/16/14 1755    Visit Number 3   Number of Visits 16   Date for PT Re-Evaluation 12/08/14   PT Start Time 1633   PT Stop Time 1745   PT Time Calculation (min) 72 min   Activity Tolerance Patient tolerated treatment well   Behavior During Therapy [facility] for tasks assessed/performed      Past Medical History  Diagnosis Date  . Frequent headaches   . Migraines   . Urinary tract bacterial infections   . Tourette disorder   . Binge eating disorder   . Seasonal allergies   . PID (acute pelvic inflammatory disease)     Past Surgical History  Procedure Laterality Date  . Tonsillectomy and adenoidectomy  2013    There were no vitals filed for this visit.  Visit Diagnosis:  Weakness of right lower extremity  Muscle tightness  Knee pain, acute, right      Subjective Assessment - 11/16/14 1640    Subjective Fast kicks in pool increased pain briefly anterior knee  .    Currently in Pain? No/denies   Pain Score 0-No pain  8/10 lasted a second   Pain Location Knee   Pain Orientation Right   Pain Descriptors / Indicators Sharp   Pain Type Acute pain   Pain Onset Yesterday   Pain Frequency Rarely   Aggravating Factors  kicking in pool hyperextending knee.  yesterday   Pain Relieving Factors avoids fast kicks in pool   Multiple Pain Sites No                         [facility] Adult PT Treatment/Exercise - 11/16/14 1652    Self-Care   Other Self-Care Comments  shoes, 2 ply arch constructed from corrugated board RT foot.  taped to foot to remove at the enf of the day.  Next shoes should be more supportive, get  opinion of shoe store, [employer], [employer] etc.     Knee/Hip Exercises: Stretches   Passive Hamstring Stretch 3 reps;30 seconds   Passive Hamstring Stretch Limitations 90 degrees bilateral   Knee/Hip Exercises: Standing   Heel Raises --  25 reps single leg RT   Lateral Step Up Right;1 set;Hand Hold: 1;Step Height: 6"   Forward Step Up Right;1 set;Hand Hold: 1;Step Height: 6"   Functional Squat Limitations foot collapses at  ,  tibia rotation increased   4/10 when knee not blocking, with block 2/10   Knee/Hip Exercises: Seated   Long Arc Quad Limitations 4+/5 MMT RT  painfree   Other Seated Knee/Hip Exercises MMT Rt hamstrings 4+/5 RT   Knee/Hip Exercises: Sidelying   Hip ABduction 10 reps   Hip ABduction Limitations 4/5 mmt rt   Clams 10  cues initially   Vasopneumatic   Number Minutes Vasopneumatic  15 minutes   Vasopnuematic Location  Knee   Vasopneumatic Pressure High  patient's request   Vasopneumatic Temperature  32 degrees.                  PT Education - 11/16/14 1755    Education provided Yes   Education  Details see self care for shoes, arch supports   Person(s) Educated Patient   Methods Explanation   Comprehension Verbalized understanding          PT Short Term Goals - 11/16/14 1656    PT SHORT TERM GOAL #1   Title Patient will express understanding of RICE principle for swelling and pain control   Baseline understands, played sports for years.  Soft ball, soccer.   Time 4   Period Weeks   Status Achieved   PT SHORT TERM GOAL #2   Title Patient will have right HS length to 80 degrees needed for improved daily mobility   Baseline 90   Time 4   Period Weeks   Status Achieved   PT SHORT TERM GOAL #3   Title Patient will report an overall improvement in pain, reduction in pulling and catching sensation by 25%   Baseline Does not do everyday and it happen every time she started to walk somewhere.   Time 4   Period Weeks   Status Achieved            PT Long Term Goals - 11/16/14 1659    PT LONG TERM GOAL #1   Title (p) Patient will be independent in safe, self progression of HEP and gym program   Time (p) 8   Period (p) Weeks   Status (p) On-going   PT LONG TERM GOAL #2   Title (p) The patient will have 4+/5 hip abduction and knee extension strength on right   Baseline (p) Hip ABD 4/5   Time (p) 8   Period (p) Weeks   Status (p) On-going               Plan - 11/16/14 1756    Clinical Impression Statement Sore post session 2/10.  trial arch with corrugated board - assess. It did not look as if it was supportive,  shoes were very flexible and she thought these were supportive.  STG #1, #2, #3 met.     PT Next Visit Plan assess arch, See if she follows through with arch supports or better shoes for her.  Ask if she checked withy her insurance company about ionto poresis coverage.  She wanted to ask first.     Consulted and Agree with Plan of Care Patient        Problem List Patient Active Problem List   Diagnosis Date Noted  . Right knee injury 09/24/2014  . Attention deficit hyperactivity disorder (ADHD), predominantly inattentive type 04/16/2014  . ADD (attention deficit disorder) 01/15/2014  . Proteinuria 12/24/2013  . Migraine, unspecified, without mention of intractable migraine without mention of status migrainosus 12/02/2013  . PID (acute pelvic inflammatory disease) 11/24/2013  . Routine screening for STI (sexually transmitted infection) 10/29/2013  . Tourette disease 10/18/2013  . Abnormal neurological exam 10/18/2013    [facility] 11/16/2014, 6:02 PM  [facility] [address] [place], [place], [address] Phone: [phone]   Fax:  [phone]     Melvenia Needles, PTA 11/16/2014 6:02 PM Phone: [phone] Fax: [phone]

## 2014-11-20 ENCOUNTER — Ambulatory Visit: Payer: 59 | Admitting: Physical Therapy

## 2014-11-20 DIAGNOSIS — M6289 Other specified disorders of muscle: Secondary | ICD-10-CM

## 2014-11-20 DIAGNOSIS — R29898 Other symptoms and signs involving the musculoskeletal system: Secondary | ICD-10-CM | POA: Diagnosis not present

## 2014-11-20 DIAGNOSIS — M25561 Pain in right knee: Secondary | ICD-10-CM

## 2014-11-20 NOTE — Therapy (Signed)
[facility] [address] [place], [place], [address] Phone: [phone]   Fax:  [phone]  Physical Therapy Treatment  Patient Details  Name: Cindy Jones MRN: [id number] Date of Birth: Jan 05, 1991 Referring Provider:  Kelle Darting, NP  Encounter Date: 11/20/2014      PT End of Session - 11/20/14 0753    Visit Number 4   Number of Visits 16   Date for PT Re-Evaluation 12/08/14   Authorization Type [employer]   PT Start Time 0704   PT Stop Time 0805   PT Time Calculation (min) 61 min   Activity Tolerance Patient tolerated treatment well      Past Medical History  Diagnosis Date  . Frequent headaches   . Migraines   . Urinary tract bacterial infections   . Tourette disorder   . Binge eating disorder   . Seasonal allergies   . PID (acute pelvic inflammatory disease)     Past Surgical History  Procedure Laterality Date  . Tonsillectomy and adenoidectomy  2013    There were no vitals filed for this visit.  Visit Diagnosis:  Weakness of right lower extremity  Muscle tightness  Knee pain, acute, right      Subjective Assessment - 11/20/14 0714    Subjective Some crepitus this AM on bike.  Only catching every couple of days but not while working out.  Jogging about 2 miles off/on with walking and some walking on an incline.     Currently in Pain? No/denies   Pain Score 0-No pain   Pain Location Knee   Pain Orientation Right   Pain Type Acute pain   Pain Onset More than a month ago   Pain Frequency Rarely   Aggravating Factors  heavy weights or burpees;  force on knee while while knee is bent                         [facility] Adult PT Treatment/Exercise - 11/20/14 0717    Lumbar Exercises: Prone   Other Prone Lumbar Exercises planks 5x 10 sec holds   Knee/Hip Exercises: Aerobic   Stationary Bike 8   Knee/Hip Exercises: Standing   Heel Raises Both;15 reps   Lateral Step Up Right;15  reps;Step Height: 4";Hand Hold: 0   Lateral Step Up Limitations mirror feedback   Forward Step Up Right;15 reps;Hand Hold: 0;Step Height: 4"   Step Down Right;1 set;15 reps;Hand Hold: 0   SLS hip extension and hip abd, WB on right green band 15x each   SLS with Vectors WB on right with green band pull diagonal pull downs 15x2   Knee/Hip Exercises: Sidelying   Clams red band 20x   Vasopneumatic   Number Minutes Vasopneumatic  15 minutes   Vasopnuematic Location  Knee   Vasopneumatic Pressure High   Vasopneumatic Temperature  32 degrees.                  PT Education - 11/20/14 0752    Education provided Yes   Education Details discussion on patellofemoral alignment, orthotic and running shoe assessment recommendation   Person(s) Educated Patient   Methods Explanation   Comprehension Verbalized understanding          PT Short Term Goals - 11/20/14 0801    PT SHORT TERM GOAL #1   Title Patient will express understanding of RICE principle for swelling and pain control   Status Achieved   PT SHORT  TERM GOAL #2   Title Patient will have right HS length to 80 degrees needed for improved daily mobility   Status Achieved   PT SHORT TERM GOAL #3   Title Patient will report an overall improvement in pain, reduction in pulling and catching sensation by 25%   Status Achieved           PT Long Term Goals - 11/20/14 0801    PT LONG TERM GOAL #1   Title Patient will be independent in safe, self progression of HEP and gym program   Time 8   Period Weeks   Status On-going   PT LONG TERM GOAL #2   Title The patient will have 4+/5 hip abduction and knee extension strength on right   Time 8   Period Weeks   Status On-going   PT LONG TERM GOAL #3   Title The patient will have full right knee flexion without complaint of pain needed for normal home, work activities needed for ascend/descend flights of stairs at home and work   Time 8   Period Weeks   Status On-going   PT  LONG TERM GOAL #4   Title FOTO functional outcome score improved from 49% limitation to 29% indicating improved function with less pain   Time 8   Period Weeks   Status On-going               Plan - 11/20/14 0754    Clinical Impression Statement Moderate cues needed for patellofemoral alignment.   Gluteus medius weakness and pes planus contribute to genu valgus collapse.  Patient receptive to education and plans to look into a good quality shoe and orthotics.  No pain today.  No catching.  Tolerating progression of execise without exacerbation.   PT Next Visit Plan proprioceptive exercises, gluteaus medius strengthening; core strengthening; cues for patellofemoral alignment         Problem List Patient Active Problem List   Diagnosis Date Noted  . Right knee injury 09/24/2014  . Attention deficit hyperactivity disorder (ADHD), predominantly inattentive type 04/16/2014  . ADD (attention deficit disorder) 01/15/2014  . Proteinuria 12/24/2013  . Migraine, unspecified, without mention of intractable migraine without mention of status migrainosus 12/02/2013  . PID (acute pelvic inflammatory disease) 11/24/2013  . Routine screening for STI (sexually transmitted infection) 10/29/2013  . Tourette disease 10/18/2013  . Abnormal neurological exam 10/18/2013    Vivien Presto 11/20/2014, 8:02 AM  [facility] [address] [place], [place], [address] Phone: [phone]   Fax:  [phone]   Lavinia Sharps, PT 11/20/2014 8:03 AM Phone: [phone] Fax: [phone]

## 2014-11-20 NOTE — Patient Instructions (Signed)
Discussion on orthotics, specialty store for running assessment and running shoe recommendations

## 2014-11-23 ENCOUNTER — Ambulatory Visit: Payer: 59 | Admitting: Physical Therapy

## 2014-11-23 DIAGNOSIS — R29898 Other symptoms and signs involving the musculoskeletal system: Secondary | ICD-10-CM | POA: Diagnosis not present

## 2014-11-23 DIAGNOSIS — M25561 Pain in right knee: Secondary | ICD-10-CM

## 2014-11-23 NOTE — Therapy (Addendum)
[place], [place], [address] Phone: [phone]   Fax:  [phone]  Physical Therapy Treatment/Discharge Summary  Patient Details  Name: Cindy Jones MRN: [id number] Date of Birth: 10-23-1990 Referring Provider:  Irene Pap, NP  Encounter Date: 11/23/2014      PT End of Session - 11/23/14 1742    Behavior During Therapy W[facility] for tasks assessed/performed      Past Medical History  Diagnosis Date  . Frequent headaches   . Migraines   . Urinary tract bacterial infections   . Tourette disorder   . Binge eating disorder   . Seasonal allergies   . PID (acute pelvic inflammatory disease)     Past Surgical History  Procedure Laterality Date  . Tonsillectomy and adenoidectomy  2013    There were no vitals filed for this visit.  Visit Diagnosis:  Weakness of right lower extremity  Knee pain, acute, right      Subjective Assessment - 11/23/14 1654    Subjective Tried a new trail and was able to hike without pain.  I was surprised.  Going to try water areobics on Sunday. Some crepetus while riding the bike in gym.  Edema decreasing.  Pants are not as tight.                           [facility] Adult PT Treatment/Exercise - 11/23/14 1653    Knee/Hip Exercises: Aerobic   Stationary Bike 7+ minutes L2   Knee/Hip Exercises: Machines for Strengthening   Cybex Knee Flexion --  2,1 leg 3 plates, eccentric.   Knee/Hip Exercises: Standing   Forward Lunges --  5 reps forward/reverse.  Needs more Control RT hip    Lateral Step Up Right;1 set;Step Height: 8"   Forward Step Up Right;1 set;Step Height: 8"   Wall Squat 10 reps;10 seconds  cues   Knee/Hip Exercises: Sidelying   Hip ABduction 10 reps  5 LBS distal thigh   Clams 10 reps  5 LBS   Vasopneumatic   Number Minutes Vasopneumatic  15 minutes   Vasopnuematic Location  Knee   Vasopneumatic Pressure High   Vasopneumatic  Temperature  32 degrees                  PT Short Term Goals - 11/20/14 0801    PT SHORT TERM GOAL #1   Title Patient will express understanding of RICE principle for swelling and pain control   Status Achieved   PT SHORT TERM GOAL #2   Title Patient will have right HS length to 80 degrees needed for improved daily mobility   Status Achieved   PT SHORT TERM GOAL #3   Title Patient will report an overall improvement in pain, reduction in pulling and catching sensation by 25%   Status Achieved           PT Long Term Goals - 11/23/14 1732    PT LONG TERM GOAL #1   Title Patient will be independent in safe, self progression of HEP and gym program   Time 8   Status On-going   PT LONG TERM GOAL #2   Title The patient will have 4+/5 hip abduction and knee extension strength on right   Time 8   Period Weeks   Status Unable to assess   PT LONG TERM GOAL #3   Title The patient will have full right knee flexion without  complaint of pain needed for normal home, work activities needed for ascend/descend flights of stairs at home and work   Time [place] - 11/23/14 1744    PT Next Visit Plan 1 more visit befor MD appointment.  Patient reminded of her early appointment so we can send a note to the MD.   Consulted and Agree with Plan of Care Patient        Problem List Patient Active Problem List   Diagnosis Date Noted  . Right knee injury 09/24/2014  . Attention deficit hyperactivity disorder (ADHD), predominantly inattentive type 04/16/2014  . ADD (attention deficit disorder) 01/15/2014  . Proteinuria 12/24/2013  . Migraine, unspecified, without mention of intractable migraine without mention of status migrainosus 12/02/2013  . PID (acute pelvic inflammatory disease) 11/24/2013  . Routine screening for STI (sexually transmitted infection) 10/29/2013  . Tourette disease 10/18/2013  . Abnormal neurological exam 10/18/2013     [facility] 11/23/2014, 5:46 PM  [facility] [address] [place], [place], [address] Phone: [phone]   Fax:  [phone]   PHYSICAL THERAPY DISCHARGE SUMMARY  Visits from Start of Care: 6  Current functional level related to goals / functional outcomes: Patient did not return for last scheduled appointment prior to seeing the MD.  She did not return following that appointment and her chart has been inactive for several months.  Will discharge from PT at this time with partial goals met.     Remaining deficits: See above   Education / Equipment:HEP Plan: Patient agrees to discharge.  Patient goals were partially met. Patient is being discharged due to not returning since the last visit.  ?????   Ruben Im, PT 03/16/2015 5:05 PM Phone: [phone] Fax: [phone]

## 2014-11-27 ENCOUNTER — Encounter: Payer: Self-pay | Admitting: Physical Therapy

## 2014-12-01 ENCOUNTER — Ambulatory Visit: Payer: 59 | Admitting: Family Medicine

## 2014-12-01 ENCOUNTER — Encounter: Payer: Self-pay | Admitting: Family Medicine

## 2014-12-01 ENCOUNTER — Ambulatory Visit (INDEPENDENT_AMBULATORY_CARE_PROVIDER_SITE_OTHER): Payer: 59 | Admitting: Family Medicine

## 2014-12-01 VITALS — BP 122/84 | HR 79 | Ht 66.0 in | Wt 170.0 lb

## 2014-12-01 DIAGNOSIS — M6740 Ganglion, unspecified site: Secondary | ICD-10-CM

## 2014-12-01 DIAGNOSIS — S8991XD Unspecified injury of right lower leg, subsequent encounter: Secondary | ICD-10-CM | POA: Diagnosis not present

## 2014-12-01 DIAGNOSIS — M67439 Ganglion, unspecified wrist: Secondary | ICD-10-CM

## 2014-12-01 MED ORDER — METHYLPREDNISOLONE ACETATE 40 MG/ML IJ SUSP
40.0000 mg | Freq: Once | INTRAMUSCULAR | Status: AC
  Administered 2014-12-01: 40 mg via INTRA_ARTICULAR

## 2014-12-01 NOTE — Addendum Note (Signed)
Addended by: Kathi Simpers F on: 12/01/2014 11:13 AM   Modules accepted: Orders

## 2014-12-01 NOTE — Progress Notes (Signed)
PCP and referred by: Kelle Darting, NP  Subjective:   HPI: Patient is a 24 y.o. female here for right knee pain.  5/25: Patient reports on 5/19 she was working out at [employer] - did a very deep squat and felt something in her knee - describes this as a tightness. + swelling. No locking, giving out but feels like it catches since then. Has remote history of ACL sprain but completely recovered from this. Has been taking aleve, using ice/heat.  6/20: Patient reports she is about 50% improved. Pain worse after a lot of walking. Slight swelling also with a lot of walking. Some improvement with catching. Not locking or giving out though. No new injuries.  8/2: Patient reports she is doing very well. 90% improved from right knee pain. Difficulty mainly with deep squats. Will swell after working out. Did well with PT and is still doing home exercises. No catching, locking, giving out. Also asking about a cyst on volar right wrist - pain as she types a lot and has this rested on keyboard, desk - would like aspiration/injection.  Past Medical History  Diagnosis Date  . Frequent headaches   . Migraines   . Urinary tract bacterial infections   . Tourette disorder   . Binge eating disorder   . Seasonal allergies   . PID (acute pelvic inflammatory disease)     Current Outpatient Prescriptions on File Prior to Visit  Medication Sig Dispense Refill  . amoxicillin-clavulanate (AUGMENTIN) 875-125 MG per tablet Take 1 tablet by mouth 2 (two) times daily. 20 tablet 0  . amphetamine-dextroamphetamine (ADDERALL XR) 20 MG 24 hr capsule Take 1 capsule (20 mg total) by mouth every morning. 30 capsule 0  . amphetamine-dextroamphetamine (ADDERALL XR) 20 MG 24 hr capsule Take 1 capsule (20 mg total) by mouth daily. (Patient not taking: Reported on 11/06/2014) 30 capsule 0  . amphetamine-dextroamphetamine (ADDERALL XR) 20 MG 24 hr capsule Take 1 capsule (20 mg total) by mouth daily. (Patient not  taking: Reported on 11/06/2014) 30 capsule 0  . amphetamine-dextroamphetamine (ADDERALL) 5 MG tablet Take 1 tablet (5 mg total) by mouth daily with lunch. (Patient not taking: Reported on 11/06/2014) 30 tablet 0  . amphetamine-dextroamphetamine (ADDERALL) 5 MG tablet Take 1 tablet (5 mg total) by mouth daily. (Patient not taking: Reported on 11/06/2014) 30 tablet 0  . amphetamine-dextroamphetamine (ADDERALL) 5 MG tablet Take 1 tablet (5 mg total) by mouth daily. (Patient not taking: Reported on 11/06/2014) 30 tablet 0  . aspirin-acetaminophen-caffeine (EXCEDRIN MIGRAINE) 250-250-65 MG per tablet Take 2 tablets by mouth every 6 (six) hours as needed for headache.    . fluticasone (FLONASE) 50 MCG/ACT nasal spray Place 2 sprays into both nostrils daily. 16 g 6  . loratadine (CLARITIN) 10 MG tablet Take 1 tablet (10 mg total) by mouth daily. 30 tablet 11  . Multiple Vitamins-Minerals (MULTIVITAL PO) Take 1 tablet by mouth daily.    [name] Penicillamine 250 MG TABS Take 500 mg by mouth.     No current facility-administered medications on file prior to visit.    Past Surgical History  Procedure Laterality Date  . Tonsillectomy and adenoidectomy  2013    Allergies  Allergen Reactions  . Latex Swelling and Rash  . Naproxen   . Sulfa Antibiotics Hives and Swelling    History   Social History  . Marital Status: Single    Spouse Name: N/A  . Number of Children: 0  . Years of Education: N/A  Occupational History  . Not on file.   Social History Main Topics  . Smoking status: Never Smoker   . Smokeless tobacco: Never Used  . Alcohol Use: 1.2 oz/week    2 Glasses of wine per week  . Drug Use: No  . Sexual Activity: Yes    Birth Control/ Protection: None, Condom   Other Topics Concern  . Not on file   Social History Narrative   Ms. Phill Myron recently graduated from [employer]. She works FT as a Patent attorney. She attended boarding school in [place] when in HS. Her father died when she was 38  yo. Her mother is an alcoholic. Her aunt & uncle were her guardians when she was in HS. They live in [place], [place]. She has a fraternal twin sister.     Family History  Problem Relation Age of Onset  . Alcohol abuse Mother   . Bipolar disorder Mother   . Cancer Father     sarcoma  . Tourette syndrome Maternal Aunt   . Alcohol abuse Maternal Aunt   . Hyperlipidemia Paternal Aunt   . Tourette syndrome Maternal Grandfather   . Heart disease Paternal Grandfather   . Alcohol abuse Maternal Uncle   . Suicidality Neg Hx     BP 122/84 mmHg  Pulse 79  Ht  (1.676 m)  Wt 170 lb (77.111 kg)  BMI 27.45 kg/m2  LMP 11/03/2014  Review of Systems: See HPI above.    Objective:  Physical Exam:  Gen: NAD  Right knee: Mild effusion.  No other gross deformity, ecchymoses. No TTP medial joint line.  No post patellar facet tenderness. FROM. Negative ant/post drawers. Negative valgus/varus testing. Negative lachmanns. Negative mcmurrays, apleys.  Negative patellar apprehension. NV intact distally.  Right wrist: Mobile mass radial aspect of volar wrist. Mild tenderness to palpation here. No redness, warmth, other deformity.  MSK u/s:  Confirms ganglion cyst of right wrist.  No neovascularity.    Assessment & Plan:  1. Right knee injury - occurred with squatting.  Exam, effusion, mechanism all consistent with meniscus tear.  Has done very well with conservative treatment - transition to HEP for next 6 weeks.  Consider MRI only if she doesn't continue to improve.  2. Right wrist ganglion cyst - discussed options and she decided to go ahead with aspiration/injection of cyst.  Compression, icing, elevation.  After informed written consent patient was lying supine on exam table.  Area overlying right volar wrist ganglion cyst prepped with alcohol swab and cold spray, injected with 0.42mL marcaine for local anesthesia.  Using an 18g syringe a very small amount of gelatinous cystic  fluid was aspirated from the ganglion cyst then this was injected with 0.63mL depomedrol.  Patient tolerated procedure well without immediate complications.

## 2014-12-01 NOTE — Assessment & Plan Note (Signed)
occurred with squatting.  Exam, effusion, mechanism all consistent with meniscus tear.  Has done very well with conservative treatment - transition to HEP for next 6 weeks.  Consider MRI only if she doesn't continue to improve.

## 2014-12-01 NOTE — Assessment & Plan Note (Signed)
Right wrist ganglion cyst - discussed options and she decided to go ahead with aspiration/injection of cyst.  Compression, icing, elevation.  After informed written consent patient was lying supine on exam table.  Area overlying right volar wrist ganglion cyst prepped with alcohol swab and cold spray, injected with 0.73mL marcaine for local anesthesia.  Using an 18g syringe a very small amount of gelatinous cystic fluid was aspirated from the ganglion cyst then this was injected with 0.52mL depomedrol.  Patient tolerated procedure well without immediate complications.

## 2015-01-19 ENCOUNTER — Ambulatory Visit (INDEPENDENT_AMBULATORY_CARE_PROVIDER_SITE_OTHER): Payer: 59 | Admitting: Psychiatry

## 2015-01-19 ENCOUNTER — Encounter (HOSPITAL_COMMUNITY): Payer: Self-pay | Admitting: Psychiatry

## 2015-01-19 VITALS — BP 114/78 | HR 70 | Ht 66.0 in | Wt 161.0 lb

## 2015-01-19 DIAGNOSIS — F952 Tourette's disorder: Secondary | ICD-10-CM | POA: Diagnosis not present

## 2015-01-19 DIAGNOSIS — F900 Attention-deficit hyperactivity disorder, predominantly inattentive type: Secondary | ICD-10-CM

## 2015-01-19 MED ORDER — AMPHETAMINE-DEXTROAMPHETAMINE 10 MG PO TABS: 10.0000 mg | ORAL_TABLET | Freq: Every day | ORAL | Status: DC

## 2015-01-19 MED ORDER — AMPHETAMINE-DEXTROAMPHET ER 20 MG PO CP24: 20.0000 mg | ORAL_CAPSULE | Freq: Every day | ORAL | Status: DC

## 2015-01-19 MED ORDER — AMPHETAMINE-DEXTROAMPHET ER 20 MG PO CP24: 20.0000 mg | ORAL_CAPSULE | ORAL | Status: DC

## 2015-01-19 NOTE — Progress Notes (Signed)
Patient ID: Cindy Jones, female   DOB: June 11, 1990, 24 y.o.   MRN: [id number]  [facility] [id number] Progress Note  Cindy Jones [id number] 24 y.o.  01/19/2015 4:16 PM  Chief Complaint: "I am stressed out"  History of Present Illness: Work is stressful due to the company being bought out. Her position is up in the air due to outsourcing. She has some interviews. She is working overtime.   States she feels stable emotionally but she has a little situational depression. Pt is easily distracted due to work situation.   Adderall is not working well at  and concentration is poor. Pt would like to go up on the dose. Sleep is good and she is getting about 7 hrs. No change in appetite and she is 3 decent size meals a day. Energy is good. Denies irritability and depression. Denies GI upset and HA.   Tourettes is pretty much non-existant during the day except when the Adderall wears off. It centers around eye blinking (first symptom), chest tightening and flaring her nostrils. On days she is tired, upset and stressed she has more symptoms.   Taking meds as prescribed and denies SE.   Suicidal Ideation: No Plan Formed: No Patient has means to carry out plan: No  Homicidal Ideation: No Plan Formed: No Patient has means to carry out plan: No  Review of Systems: Psychiatric: Agitation: No Hallucination: No Depressed Mood: No Insomnia: No Hypersomnia: No Altered Concentration: No Feels Worthless: No Grandiose Ideas: No Belief In Special Powers: No New/Increased Substance Abuse: No Compulsions: No  Neurologic: Headache: No Seizure: No Paresthesias: No  Review of Systems  Constitutional: Negative for fever and chills.  HENT: Negative for congestion, ear pain, nosebleeds and sore throat.   Eyes: Negative for blurred vision, double vision and pain.  Respiratory: Negative for cough, sputum production and wheezing.   Cardiovascular: Negative for chest pain,  palpitations and leg swelling.  Gastrointestinal: Negative for heartburn, nausea, vomiting and abdominal pain.  Genitourinary: Positive for flank pain.  Musculoskeletal: Negative for back pain, joint pain and neck pain.  Skin: Negative for itching and rash.  Neurological: Negative for dizziness, sensory change, seizures, loss of consciousness, weakness and headaches.  Psychiatric/Behavioral: Negative for depression, suicidal ideas, hallucinations and substance abuse. The patient is not nervous/anxious and does not have insomnia.      Past Medical, Family, Social History: lives alone in [place]. Works at [employer]. Raised in [place] then in [place] in [place]. Father diagnosed with cancer when she was 5 and he passed away when she was 8yo. Mother became alcoholic after he passed. After he passed she went back and forth to her grandparents. Body image issues due to hyper focused step grandmother on weight. Family Members: mom and Twin sister. Close to paternal aunt and uncle.  reports that she has never smoked. She has never used smokeless tobacco. She reports that she drinks about 1.2 oz of alcohol per week. She reports that she does not use illicit drugs.  Family History  Problem Relation Age of Onset  . Alcohol abuse Mother   . Bipolar disorder Mother   . Cancer Father     sarcoma  . Tourette syndrome Maternal Aunt   . Alcohol abuse Maternal Aunt   . Hyperlipidemia Paternal Aunt   . Tourette syndrome Maternal Grandfather   . Heart disease Paternal Grandfather   . Alcohol abuse Maternal Uncle   . Suicidality Neg Hx     Past Medical History  Diagnosis Date  . Frequent headaches   . Migraines   . Urinary tract bacterial infections   . Tourette disorder   . Binge eating disorder   . Seasonal allergies   . PID (acute pelvic inflammatory disease)      Outpatient Encounter Prescriptions as of 01/19/2015  Medication Sig  . amphetamine-dextroamphetamine (ADDERALL XR) 20 MG 24 hr  capsule Take 1 capsule (20 mg total) by mouth every morning.  [name] amphetamine-dextroamphetamine (ADDERALL XR) 20 MG 24 hr capsule Take 1 capsule (20 mg total) by mouth daily.  [name] amphetamine-dextroamphetamine (ADDERALL XR) 20 MG 24 hr capsule Take 1 capsule (20 mg total) by mouth daily.  [name] amphetamine-dextroamphetamine (ADDERALL) 5 MG tablet Take 1 tablet (5 mg total) by mouth daily with lunch.  . amphetamine-dextroamphetamine (ADDERALL) 5 MG tablet Take 1 tablet (5 mg total) by mouth daily.  [name] amphetamine-dextroamphetamine (ADDERALL) 5 MG tablet Take 1 tablet (5 mg total) by mouth daily.  [name] aspirin-acetaminophen-caffeine (EXCEDRIN MIGRAINE) 250-250-65 MG per tablet Take 2 tablets by mouth every 6 (six) hours as needed for headache.  . fluticasone (FLONASE) 50 MCG/ACT nasal spray Place 2 sprays into both nostrils daily.  . Multiple Vitamins-Minerals (MULTIVITAL PO) Take 1 tablet by mouth daily.  [name] amoxicillin-clavulanate (AUGMENTIN) 875-125 MG per tablet Take 1 tablet by mouth 2 (two) times daily. (Patient not taking: Reported on 01/19/2015)  . loratadine (CLARITIN) 10 MG tablet Take 1 tablet (10 mg total) by mouth daily. (Patient not taking: Reported on 01/19/2015)  . Penicillamine 250 MG TABS Take 500 mg by mouth.   No facility-administered encounter medications on file as of 01/19/2015.    Past Psychiatric History/Hospitalization(s): Anxiety: Yes Bipolar Disorder: No Depression: Yes Mania: No Psychosis: No Schizophrenia: No Personality Disorder: No Hospitalization for psychiatric illness: No History of Electroconvulsive Shock Therapy: No Prior Suicide Attempts: No  Physical Exam: Constitutional:  BP 114/78 mmHg  Pulse 70  Ht  (1.676 m)  Wt 161 lb (73.029 kg)  BMI 26.00 kg/m2  General Appearance: alert, oriented, no acute distress and well nourished  Musculoskeletal: Strength & Muscle Tone: within normal limits Gait & Station: normal Patient leans: N/A  Mental Status  Examination/Evaluation: Objective: Attitude: Calm and cooperative  Appearance: Fairly Groomed, appears to be stated age  Eye Contact::  Good  Speech:  Clear and Coherent and Normal Rate  Volume:  Normal  Mood:  euthymic  Affect:  Full Range  Thought Process:  Goal Directed, Linear and Logical  Orientation:  Full (Time, Place, and Person)  Thought Content:  Negative  Suicidal Thoughts:  No  Homicidal Thoughts:  No  Judgement:  Fair  Insight:  Fair  Concentration: good  Memory: Immediate-good Recent-good Remote-good  Recall: fair  Language: fair  Gait and Station: normal  [employer] of Knowledge: average  Psychomotor Activity:  Normal  Akathisia:  No  Handed:  Right  AIMS (if indicated): n/a  Assets:  Manufacturing systems engineer Desire for Improvement Financial Resources/Insurance Housing Intimacy Leisure Time Physical Health Resilience Social Support Veterinary surgeon Decision Making (Choose Three): Established Problem, Stable/Improving (1), Review of Psycho-Social Stressors (1), Review of Medication Regimen & Side Effects (2) and Review of New Medication or Change in Dosage (2)  Assessment: AXIS I ADHD- inattentive type and Tourettes disorder. Hx of Bulemia  AXIS II Deferred  AXIS III Past Medical History  Diagnosis Date  . Frequent headaches   . Migraines   . Urinary tract bacterial  infections   . Tourette disorder   . Binge eating disorder   . Seasonal allergies      AXIS IV economic problems and other psychosocial or environmental problems  AXIS V 51-60 moderate symptoms   Treatment Plan/Recommendations:  Plan of Care: Medication management with supportive therapy. Risks/benefits and SE of the medication discussed. Pt verbalized understanding and verbal consent obtained for treatment. Affirm with the patient that the medications are taken as ordered. Patient expressed  understanding of how their medications were to be used.    Pt will fax records from previous psychiatrist for review  Laboratory: Reviewed labs with pt- 12/24/2013 WNL; EKG sinus arrhythmia, NSTEMI  Psychotherapy: Therapy: brief supportive therapy provided. Discussed psychosocial stressors in detail.    Medications: Adderall XR  po qD and increase Adderall to  qLunch as needed for ADD  May consider adding small dose of Zyprexa for Tourettes in the future if needed  Routine PRN Medications: No  Consultations: none at this time  Safety Concerns: Pt denies SI and is at an acute low risk for suicide.Patient told to call clinic if any problems occur. Patient advised to go to ER if they should develop SI/HI, side effects, or if symptoms worsen. Has crisis numbers to call if needed. Pt verbalized understanding.   Other: F/up in 3 months or sooner if needed    Oletta Darter, MD 01/19/2015

## 2015-03-10 ENCOUNTER — Encounter: Payer: Self-pay | Admitting: Family Medicine

## 2015-03-10 ENCOUNTER — Ambulatory Visit (INDEPENDENT_AMBULATORY_CARE_PROVIDER_SITE_OTHER): Payer: 59 | Admitting: Family Medicine

## 2015-03-10 ENCOUNTER — Telehealth (HOSPITAL_COMMUNITY): Payer: Self-pay | Admitting: *Deleted

## 2015-03-10 VITALS — BP 122/87 | HR 63 | Temp 98.1°F | Resp 20 | Wt 175.8 lb

## 2015-03-10 DIAGNOSIS — R300 Dysuria: Secondary | ICD-10-CM

## 2015-03-10 DIAGNOSIS — N390 Urinary tract infection, site not specified: Secondary | ICD-10-CM

## 2015-03-10 DIAGNOSIS — R8290 Unspecified abnormal findings in urine: Secondary | ICD-10-CM | POA: Diagnosis not present

## 2015-03-10 DIAGNOSIS — R319 Hematuria, unspecified: Secondary | ICD-10-CM | POA: Diagnosis not present

## 2015-03-10 LAB — POCT URINALYSIS DIPSTICK
Bilirubin, UA: NEGATIVE
Ketones, UA: NEGATIVE
Nitrite, UA: POSITIVE
Spec Grav, UA: 1.015
Urobilinogen, UA: 1
pH, UA: 6.5

## 2015-03-10 MED ORDER — CEPHALEXIN 500 MG PO CAPS: 500.0000 mg | ORAL_CAPSULE | Freq: Four times a day (QID) | ORAL | Status: DC

## 2015-03-10 MED ORDER — PHENAZOPYRIDINE HCL 100 MG PO TABS: 100.0000 mg | ORAL_TABLET | Freq: Three times a day (TID) | ORAL | Status: DC | PRN

## 2015-03-10 NOTE — Telephone Encounter (Signed)
Patient called stating starting new job, and insurance ends tomorrow and new insurance will not begin until Dec 1st. Wants doctor to give new script for adderall. Told pt she needed to return the old script and we could give her a new one. Pt then states insurance wont pay unless dosage increased. Informed pt that she would need to be seen before dosage of medication could be increased.

## 2015-03-10 NOTE — Progress Notes (Signed)
   Subjective:    Patient ID: Cindy Jones, female    DOB: 02/04/1991, 24 y.o.   MRN: [id number]  HPI  Dysuria: Pt presents with a 1 day h/o Urinary frequency, burning with urination and Suprapubic pain. Pt denies low back pain, fever, chills, nausea, vomit or diarrhea. Pt has just recently come off her menses and was using tampons, which she states she feels she left her tampon in too long, which possibly caused the infection. Patient denies vaginal irritation or discharge. No recent unprotected sex. No history of kidneys stones. Patient reports taking Azo at 2 pm today and still felt dysuria.  Never smoker   Past Medical History  Diagnosis Date  . Frequent headaches   . Migraines   . Urinary tract bacterial infections   . Tourette disorder   . Binge eating disorder   . Seasonal allergies   . PID (acute pelvic inflammatory disease)    Allergies  Allergen Reactions  . Latex Swelling and Rash  . Naproxen   . Sulfa Antibiotics Hives and Swelling    Review of Systems Negative, with the exception of above mentioned in HPI     Objective:   Physical Exam BP 122/87 mmHg  Pulse 63  Temp(Src) 98.1 F (36.7 C) (Oral)  Resp 20  Wt 175 lb 12.8 oz (79.742 kg)  SpO2 100%  LMP 03/09/2015 Gen: Afebrile. No acute distress. Nontoxic in appearance. Pleasant caucasian female.  HENT: AT. [place]. MMM.  Eyes:Pupils Equal Round Reactive to light, Extraocular movements intact,  Conjunctiva without redness, discharge or icterus. CV: RRR  Abd: Soft. flat. Suprapubic tenderness present. ND. BS present. No Masses palpated.  MSK; no CVA tenderness bilaterally Neuro: Normal gait. PERLA. EOMi. Alert. Oriented x3   Assessment & Plan:  Urinary tract infection with hematuria, site unspecified/abnormal urine/Burning with urination - Urinalysis, Routine w reflex microscopic - Urine Culture - POCT Urinalysis Dipstick--> pos GLU 100, large blood , pro 100. Nitrite positive, leu Large Pt unfortunately  did take AZO today, which will askew results. Culture sent.  - phenazopyridine (PYRIDIUM) 100 MG tablet; Take 1 tablet (100 mg total) by mouth 3 (three) times daily as needed for pain.  Dispense: 10 tablet; Refill: 0 - cephALEXin (KEFLEX) 500 MG capsule; Take 1 capsule (500 mg total) by mouth 4 (four) times daily.  Dispense: 28 capsule; Refill: 0 - F/U as needed

## 2015-03-10 NOTE — Patient Instructions (Addendum)
Urinary Tract Infection Urinary tract infections (UTIs) can develop anywhere along your urinary tract. Your urinary tract is your body's drainage system for removing wastes and extra water. Your urinary tract includes two kidneys, two ureters, a bladder, and a urethra. Your kidneys are a pair of bean-shaped organs. Each kidney is about the size of your fist. They are located below your ribs, one on each side of your spine. CAUSES Infections are caused by microbes, which are microscopic organisms, including fungi, viruses, and bacteria. These organisms are so small that they can only be seen through a microscope. Bacteria are the microbes that most commonly cause UTIs. SYMPTOMS  Symptoms of UTIs may vary by age and gender of the patient and by the location of the infection. Symptoms in young women typically include a frequent and intense urge to urinate and a painful, burning feeling in the bladder or urethra during urination. Older women and men are more likely to be tired, shaky, and weak and have muscle aches and abdominal pain. A fever may mean the infection is in your kidneys. Other symptoms of a kidney infection include pain in your back or sides below the ribs, nausea, and vomiting. DIAGNOSIS To diagnose a UTI, your caregiver will ask you about your symptoms. Your caregiver will also ask you to provide a urine sample. The urine sample will be tested for bacteria and white blood cells. White blood cells are made by your body to help fight infection. TREATMENT  Typically, UTIs can be treated with medication. Because most UTIs are caused by a bacterial infection, they usually can be treated with the use of antibiotics. The choice of antibiotic and length of treatment depend on your symptoms and the type of bacteria causing your infection. HOME CARE INSTRUCTIONS  If you were prescribed antibiotics, take them exactly as your caregiver instructs you. Finish the medication even if you feel better after  you have only taken some of the medication.  Drink enough water and fluids to keep your urine clear or pale yellow.  Avoid caffeine, tea, and carbonated beverages. They tend to irritate your bladder.  Empty your bladder often. Avoid holding urine for long periods of time.  Empty your bladder before and after sexual intercourse.  After a bowel movement, women should cleanse from front to back. Use each tissue only once. SEEK MEDICAL CARE IF:   You have back pain.  You develop a fever.  Your symptoms do not begin to resolve within 3 days. SEEK IMMEDIATE MEDICAL CARE IF:   You have severe back pain or lower abdominal pain.  You develop chills.  You have nausea or vomiting.  You have continued burning or discomfort with urination. MAKE SURE YOU:   Understand these instructions.  Will watch your condition.  Will get help right away if you are not doing well or get worse.   This information is not intended to replace advice given to you by your health care provider. Make sure you discuss any questions you have with your health care provider.   Document Released: 01/25/2005 Document Revised: 01/06/2015 Document Reviewed: 05/26/2011 Elsevier Interactive Patient Education [employer]2016 Elsevier Inc.  I have called in pyridium for comfort, and antibiotic for infection.

## 2015-03-11 LAB — URINALYSIS, MICROSCOPIC ONLY
Casts: NONE SEEN [LPF]
Crystals: NONE SEEN [HPF]
RBC / HPF: >60 RBC/HPF — AB (ref <=2)
Squamous Epithelial / LPF: NONE SEEN [HPF] (ref <=5)
Yeast: NONE SEEN [HPF]

## 2015-03-11 LAB — URINALYSIS, ROUTINE W REFLEX MICROSCOPIC
Bilirubin Urine: NEGATIVE
Glucose, UA: NEGATIVE
Ketones, ur: NEGATIVE
Nitrite: POSITIVE — AB
Specific Gravity, Urine: 1.01 (ref 1.001–1.035)
pH: 7.5 (ref 5.0–8.0)

## 2015-03-12 ENCOUNTER — Telehealth: Payer: Self-pay | Admitting: Family Medicine

## 2015-03-12 LAB — URINE CULTURE: Colony Count: 85000

## 2015-03-12 NOTE — Telephone Encounter (Signed)
Spoke with patient reviewed urine culture results. Reminded patient to complete antibiotic as directed. Patient verbalized understanding.

## 2015-03-12 NOTE — Telephone Encounter (Signed)
Please call pt: - her urine culture grew e.coli infection, the antibiotic she was placed on should cover it.

## 2015-03-23 ENCOUNTER — Ambulatory Visit (HOSPITAL_COMMUNITY): Payer: Self-pay | Admitting: Psychiatry

## 2015-04-20 ENCOUNTER — Encounter (HOSPITAL_COMMUNITY): Payer: Self-pay | Admitting: Psychiatry

## 2015-04-20 ENCOUNTER — Ambulatory Visit (INDEPENDENT_AMBULATORY_CARE_PROVIDER_SITE_OTHER): Payer: BLUE CROSS/BLUE SHIELD | Admitting: Psychiatry

## 2015-04-20 VITALS — BP 118/75 | HR 77 | Ht 65.5 in | Wt 165.0 lb

## 2015-04-20 DIAGNOSIS — F502 Bulimia nervosa: Secondary | ICD-10-CM | POA: Diagnosis not present

## 2015-04-20 DIAGNOSIS — F900 Attention-deficit hyperactivity disorder, predominantly inattentive type: Secondary | ICD-10-CM | POA: Diagnosis not present

## 2015-04-20 DIAGNOSIS — F324 Major depressive disorder, single episode, in partial remission: Secondary | ICD-10-CM

## 2015-04-20 DIAGNOSIS — F952 Tourette's disorder: Secondary | ICD-10-CM | POA: Diagnosis not present

## 2015-04-20 MED ORDER — AMPHETAMINE-DEXTROAMPHET ER 20 MG PO CP24
20.0000 mg | ORAL_CAPSULE | Freq: Every day | ORAL | Status: DC
Start: 2015-04-20 — End: 2015-07-14

## 2015-04-20 MED ORDER — AMPHETAMINE-DEXTROAMPHET ER 20 MG PO CP24: 20.0000 mg | ORAL_CAPSULE | Freq: Every day | ORAL | Status: DC

## 2015-04-20 MED ORDER — AMPHETAMINE-DEXTROAMPHETAMINE 10 MG PO TABS: 10.0000 mg | ORAL_TABLET | Freq: Every day | ORAL | Status: DC

## 2015-04-20 NOTE — Progress Notes (Signed)
[facility] [id number] Progress Note  Cindy Jones [id number] 24 y.o.  04/20/2015 8:40 AM  Chief Complaint: "I had a great 2016"  History of Present Illness: Pt got a new job 1 month ago. She likes it but is nervous about all she has to learn.   States she feels stable emotionally but she has a little situational depression. Pt denies depression. Denies anhedonia, isolation, crying spells, low motivation, poor hygiene, worthlessness and hopelessness.   Sleep, appetite, energy are good. Pt is eating 3 meals a day and denies any symptoms of bulmia. She has days where she feels "crappy" about herself but her boyfriend is supportive.    Adderall is working well and concentration is good. Notes if she doesn't take her second dose then she is more distracted and disconnected in the afternoons.   Denies irritability and depression. Denies GI upset and HA.   Tourettes is pretty much non-existant during the day except when the Adderall wears off. It centers around eye blinking (first symptom), chest tightening and flaring her nostrils. On days she is tired, upset and stressed she has more symptoms. Pt has been more stressed due to work and is noticing more eye blinking in the evenings.   Taking meds as prescribed and denies SE.   Suicidal Ideation: No Plan Formed: No Patient has means to carry out plan: No  Homicidal Ideation: No Plan Formed: No Patient has means to carry out plan: No  Review of Systems: Psychiatric: Agitation: No Hallucination: No Depressed Mood: No Insomnia: No Hypersomnia: No Altered Concentration: No Feels Worthless: No Grandiose Ideas: No Belief In Special Powers: No New/Increased Substance Abuse: No Compulsions: No  Neurologic: Headache: No Seizure: No Paresthesias: No  Review of Systems  Constitutional: Negative for fever and chills.  HENT: Negative for congestion, ear pain, nosebleeds and sore throat.   Eyes: Negative for blurred vision,  double vision and pain.  Respiratory: Negative for cough, sputum production and wheezing.   Cardiovascular: Negative for chest pain, palpitations and leg swelling.  Gastrointestinal: Negative for heartburn, nausea, vomiting and abdominal pain.  Musculoskeletal: Negative for back pain, joint pain and neck pain.  Skin: Negative for itching and rash.  Neurological: Negative for dizziness, sensory change, seizures, loss of consciousness, weakness and headaches.  Psychiatric/Behavioral: Negative for depression, suicidal ideas, hallucinations and substance abuse. The patient is not nervous/anxious and does not have insomnia.      Past Medical, Family, Social History: lives alone in [place]. Works at [employer]. Raised in [place] then in [place]. Father diagnosed with cancer when she was 5 and he passed away when she was 8yo. Mother became alcoholic after he passed. After he passed she went back and forth to her grandparents. Body image issues due to hyper focused step grandmother on weight. Family Members: mom and Twin sister. Close to paternal aunt and uncle.  reports that she has never smoked. She has never used smokeless tobacco. She reports that she drinks about 1.2 oz of alcohol per week. She reports that she does not use illicit drugs.  Family History  Problem Relation Age of Onset  . Alcohol abuse Mother   . Bipolar disorder Mother   . Cancer Father     sarcoma  . Tourette syndrome Maternal Aunt   . Alcohol abuse Maternal Aunt   . Hyperlipidemia Paternal Aunt   . Tourette syndrome Maternal Grandfather   . Heart disease Paternal Grandfather   . Alcohol abuse Maternal Uncle   .  Suicidality Neg Hx     Past Medical History  Diagnosis Date  . Frequent headaches   . Migraines   . Urinary tract bacterial infections   . Tourette disorder   . Binge eating disorder   . Seasonal allergies   . PID (acute pelvic inflammatory disease)      Outpatient Encounter Prescriptions as  of 04/20/2015  Medication Sig  . amphetamine-dextroamphetamine (ADDERALL XR) 20 MG 24 hr capsule Take 1 capsule (20 mg total) by mouth daily.  [name] amphetamine-dextroamphetamine (ADDERALL) 10 MG tablet Take 1 tablet (10 mg total) by mouth daily with lunch.  [name] aspirin-acetaminophen-caffeine (EXCEDRIN MIGRAINE) 250-250-65 MG per tablet Take 2 tablets by mouth every 6 (six) hours as needed for headache.  . clotrimazole-betamethasone (LOTRISONE) cream APP TOPICALLY D  . Multiple Vitamins-Minerals (MULTIVITAL PO) Take 1 tablet by mouth daily.  . cephALEXin (KEFLEX) 500 MG capsule Take 1 capsule (500 mg total) by mouth 4 (four) times daily. (Patient not taking: Reported on 04/20/2015)  . fluticasone (FLONASE) 50 MCG/ACT nasal spray Place 2 sprays into both nostrils daily. (Patient not taking: Reported on 04/20/2015)  . loratadine (CLARITIN) 10 MG tablet Take 1 tablet (10 mg total) by mouth daily. (Patient not taking: Reported on 01/19/2015)  . phenazopyridine (PYRIDIUM) 100 MG tablet Take 1 tablet (100 mg total) by mouth 3 (three) times daily as needed for pain. (Patient not taking: Reported on 04/20/2015)   No facility-administered encounter medications on file as of 04/20/2015.    Past Psychiatric History/Hospitalization(s): Anxiety: Yes Bipolar Disorder: No Depression: Yes Mania: No Psychosis: No Schizophrenia: No Personality Disorder: No Hospitalization for psychiatric illness: No History of Electroconvulsive Shock Therapy: No Prior Suicide Attempts: No  Physical Exam: Constitutional:  BP 118/75 mmHg  Pulse 77  Ht 5' 5.5" (1.664 m)  Wt 165 lb (74.844 kg)  BMI 27.03 kg/m2  General Appearance: alert, oriented, no acute distress and well nourished  Musculoskeletal: Strength & Muscle Tone: within normal limits Gait & Station: normal Patient leans: N/A  Mental Status Examination/Evaluation: Objective: Attitude: Calm and cooperative  Appearance: Fairly Groomed, appears to be stated  age  Eye Contact::  Good  Speech:  Clear and Coherent and Normal Rate  Volume:  Normal  Mood:  euthymic  Affect:  Full Range  Thought Process:  Goal Directed, Linear and Logical  Orientation:  Full (Time, Place, and Person)  Thought Content:  Negative  Suicidal Thoughts:  No  Homicidal Thoughts:  No  Judgement:  Fair  Insight:  Fair  Concentration: good  Memory: Immediate-good Recent-good Remote-good  Recall: fair  Language: fair  Gait and Station: normal  [employer] of Knowledge: average  Psychomotor Activity:  Normal  Akathisia:  No  Handed:  Right  AIMS (if indicated): n/a  Assets:  Manufacturing systems engineer Desire for Improvement Financial Resources/Insurance Housing Intimacy Leisure Time Physical Health Resilience Social Support Veterinary surgeon Decision Making (Choose Three): Established Problem, Stable/Improving (1), Review of Psycho-Social Stressors (1), Review of Medication Regimen & Side Effects (2) and Review of New Medication or Change in Dosage (2)  Assessment: AXIS I ADHD- inattentive type and Tourettes disorder. Hx of Bulemia; MDD-resolved  AXIS II Deferred   Treatment Plan/Recommendations:  Plan of Care: Medication management with supportive therapy. Risks/benefits and SE of the medication discussed. Pt verbalized understanding and verbal consent obtained for treatment. Affirm with the patient that the medications are taken as ordered. Patient expressed understanding of how their medications were  to be used.      Laboratory: Reviewed labs with pt- 12/24/2013 WNL; EKG sinus arrhythmia, NSTEMI  Psychotherapy: Therapy: brief supportive therapy provided. Discussed psychosocial stressors in detail.    Medications: Adderall XR  po qD and Adderall to  qLunch as needed for ADD  May consider adding small dose of Zyprexa for Tourettes in the future if needed  Routine PRN Medications:  No  Consultations: none at this time  Safety Concerns: Pt denies SI and is at an acute low risk for suicide.Patient told to call clinic if any problems occur. Patient advised to go to ER if they should develop SI/HI, side effects, or if symptoms worsen. Has crisis numbers to call if needed. Pt verbalized understanding.   Other: F/up in 3 months or sooner if needed    Oletta Darter, MD 04/20/2015

## 2015-05-07 ENCOUNTER — Telehealth (HOSPITAL_COMMUNITY): Payer: Self-pay

## 2015-05-07 DIAGNOSIS — F900 Attention-deficit hyperactivity disorder, predominantly inattentive type: Secondary | ICD-10-CM
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
## 2015-05-10 ENCOUNTER — Ambulatory Visit (INDEPENDENT_AMBULATORY_CARE_PROVIDER_SITE_OTHER): Payer: BLUE CROSS/BLUE SHIELD | Admitting: Internal Medicine

## 2015-05-10 VITALS — BP 104/76 | HR 70 | Temp 98.0°F | Resp 16 | Ht 66.0 in | Wt 180.0 lb

## 2015-05-10 DIAGNOSIS — R350 Frequency of micturition: Secondary | ICD-10-CM

## 2015-05-10 LAB — POCT URINALYSIS DIP (MANUAL ENTRY)
Blood, UA: NEGATIVE
Glucose, UA: 100 — AB
Leukocytes, UA: NEGATIVE
Nitrite, UA: POSITIVE — AB
Protein Ur, POC: 30 — AB
Spec Grav, UA: 1.02
Urobilinogen, UA: 2
pH, UA: 5.5

## 2015-05-10 LAB — POC MICROSCOPIC URINALYSIS (UMFC): Mucus: ABSENT

## 2015-05-10 MED ORDER — CIPROFLOXACIN HCL 250 MG PO TABS: 250.0000 mg | ORAL_TABLET | Freq: Two times a day (BID) | ORAL | Status: DC

## 2015-05-10 NOTE — Progress Notes (Addendum)
Subjective:  By signing my name below, I, Cindy Jones, attest that this documentation has been prepared under the direction and in the presence of Cindy Ferryobert Dootlittle, MD.  Electronically Signed: Andrew Auaven Jones, ED Scribe. 05/10/2015. 5:18 PM.   Patient ID: Cindy Jones, female    DOB: 01-29-1991, 25 y.o.   MRN: [id number]  HPI Chief Complaint  Patient presents with  . Urinary Tract Infection    Currently taking Azo  . Urinary Frequency   HPI Comments: Cindy Parkinsmily H Jones is a 25 y.o. female who presents to the [facility] complaining of  UTI symptoms. She reports symptoms of urinary frequency-dysuria. She is currently taking AZO. Her last UTI was 03/10/15. Treated with Keflex at that time.  One partner w/out vag d/c No prior GU issues like this No fever or back pain  Patient Active Problem List   Diagnosis Date Noted  . Bulimia 04/20/2015  . Major depressive disorder with single episode, in partial remission (HCC) 04/20/2015  . Infection of urinary tract 03/10/2015  . Palmar wrist ganglion 12/01/2014  . Right knee injury 09/24/2014  . Attention deficit hyperactivity disorder (ADHD), predominantly inattentive type 04/16/2014  . ADD (attention deficit disorder) 01/15/2014  . Proteinuria 12/24/2013  . Migraine, unspecified, without mention of intractable migraine without mention of status migrainosus 12/02/2013  . PID (acute pelvic inflammatory disease) 11/24/2013  . Routine screening for STI (sexually transmitted infection) 10/29/2013  . Tourette disease 10/18/2013  . Abnormal neurological exam 10/18/2013    Past Surgical History  Procedure Laterality Date  . Tonsillectomy and adenoidectomy  2013   Prior to Admission medications   Medication Sig Start Date End Date Taking? Authorizing Provider  amphetamine-dextroamphetamine (ADDERALL XR) 20 MG 24 hr capsule Take 1 capsule (20 mg total) by mouth daily. 04/20/15  Yes Oletta DarterSalina Agarwal, MD    aspirin-acetaminophen-caffeine (EXCEDRIN MIGRAINE) [phone] MG per tablet Take 2 tablets by mouth every 6 (six) hours as needed for headache.   Yes Historical Provider, MD  Phenazopyridine HCl (AZO TABS PO) Take by mouth.   Yes Historical Provider, MD  amphetamine-dextroamphetamine (ADDERALL XR) 20 MG 24 hr capsule Take 1 capsule (20 mg total) by mouth daily. Patient not taking: Reported on 05/10/2015 04/20/15 04/19/16  Oletta DarterSalina Agarwal, MD  amphetamine-dextroamphetamine (ADDERALL XR) 20 MG 24 hr capsule Take 1 capsule (20 mg total) by mouth daily. Patient not taking: Reported on 05/10/2015 04/20/15 04/19/16  Oletta DarterSalina Agarwal, MD  amphetamine-dextroamphetamine (ADDERALL) 10 MG tablet Take 1 tablet (10 mg total) by mouth daily with lunch. Patient not taking: Reported on 05/10/2015 04/20/15 04/19/16  Oletta DarterSalina Agarwal, MD  amphetamine-dextroamphetamine (ADDERALL) 10 MG tablet Take 1 tablet (10 mg total) by mouth daily. Patient not taking: Reported on 05/10/2015 04/20/15 04/19/16  Oletta DarterSalina Agarwal, MD  amphetamine-dextroamphetamine (ADDERALL) 10 MG tablet Take 1 tablet (10 mg total) by mouth daily. Patient not taking: Reported on 05/10/2015 04/20/15 04/19/16  Oletta DarterSalina Agarwal, MD  cephALEXin (KEFLEX) 500 MG capsule Take 1 capsule (500 mg total) by mouth 4 (four) times daily. Patient not taking: Reported on 04/20/2015 03/10/15   Renee A Kuneff, DO  clotrimazole-betamethasone (LOTRISONE) cream Reported on 05/10/2015 12/27/14   Historical Provider, MD  fluticasone (FLONASE) 50 MCG/ACT nasal spray Place 2 sprays into both nostrils daily. Patient not taking: Reported on 04/20/2015 11/06/14   Ethelda ChickKristi M Smith, MD  loratadine (CLARITIN) 10 MG tablet Take 1 tablet (10 mg total) by mouth daily. Patient not taking: Reported on 01/19/2015 11/06/14   Myrle ShengKristi M  Katrinka Blazing, MD  Multiple Vitamins-Minerals (MULTIVITAL PO) Take 1 tablet by mouth daily. Reported on 05/10/2015    Historical Provider, MD  phenazopyridine (PYRIDIUM) 100 MG tablet Take 1  tablet (100 mg total) by mouth 3 (three) times daily as needed for pain. Patient not taking: Reported on 04/20/2015 03/10/15   Natalia Leatherwood, DO   Review of Systems  Constitutional: Negative for fever and chills.  Genitourinary: Positive for dysuria and frequency.   Objective:   Physical Exam  Constitutional: She is oriented to person, place, and time. She appears well-developed and well-nourished. No distress.  HENT:  Head: Normocephalic and atraumatic.  Eyes: Conjunctivae and EOM are normal.  Neck: Neck supple.  Cardiovascular: Normal rate.   Pulmonary/Chest: Effort normal.  Musculoskeletal: Normal range of motion.  Neurological: She is alert and oriented to person, place, and time.  Skin: Skin is warm and dry.  Psychiatric: She has a normal mood and affect. Her behavior is normal.  Nursing note and vitals reviewed.  Filed Vitals:   05/10/15 1713  BP: 104/76  Pulse: 70  Temp: 98 F (36.7 C)  Resp: 16  Height: 5\' 6"  (1.676 m)  Weight: 180 lb (81.647 kg)  SpO2: 99%   Results for orders placed or performed in visit on 05/10/15  POCT urinalysis dipstick  Result Value Ref Range   Color, UA orange (A) yellow   Clarity, UA clear clear   Glucose, UA =100 (A) negative   Bilirubin, UA Jones (A) negative   Ketones, POC UA trace (5) (A) negative   Spec Grav, UA 1.020    Blood, UA negative negative   pH, UA 5.5    Protein Ur, POC =30 (A) negative   Urobilinogen, UA 2.0    Nitrite, UA Positive (A) Negative   Leukocytes, UA Negative Negative  POCT Microscopic Urinalysis ([facility])  Result Value Ref Range   WBC,UR,HPF,POC None None WBC/hpf   RBC,UR,HPF,POC None None RBC/hpf   Bacteria Moderate (A) None, Too numerous to count   Mucus Absent Absent   Epithelial Cells, UR Per Microscopy Moderate (A) None, Too numerous to count cells/hpf   Assessment & Plan:   1. Urinary frequency   2.      UTI-relapse vs reinfection  Orders Placed This Encounter  Procedures  . Urine culture    See last C&S pattern 11/16  Meds ordered this encounter  Medications  . Phenazopyridine HCl (AZO TABS PO)    Sig: Take by mouth.  . ciprofloxacin (CIPRO) 250 MG tablet    Sig: Take 1 tablet (250 mg total) by mouth 2 (two) times daily.    Dispense:  20 tablet    Refill:  0  I have completed the patient encounter in its entirety as documented by the scribe, with editing by me where necessary. Robert P. Merla Riches, M.D.

## 2015-05-11 NOTE — Telephone Encounter (Signed)
Yes we can do a one time early refill of Adderall 10mg .

## 2015-05-12 LAB — URINE CULTURE
Colony Count: NO GROWTH
Organism ID, Bacteria: NO GROWTH
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
## 2015-07-04 ENCOUNTER — Ambulatory Visit (INDEPENDENT_AMBULATORY_CARE_PROVIDER_SITE_OTHER): Payer: BLUE CROSS/BLUE SHIELD | Admitting: Urgent Care

## 2015-07-04 VITALS — BP 120/80 | HR 69 | Temp 98.0°F | Resp 18 | Ht 65.5 in | Wt 181.0 lb

## 2015-07-04 DIAGNOSIS — R300 Dysuria: Secondary | ICD-10-CM | POA: Diagnosis not present

## 2015-07-04 DIAGNOSIS — N3090 Cystitis, unspecified without hematuria: Secondary | ICD-10-CM | POA: Diagnosis not present

## 2015-07-04 DIAGNOSIS — R350 Frequency of micturition: Secondary | ICD-10-CM | POA: Diagnosis not present

## 2015-07-04 DIAGNOSIS — N390 Urinary tract infection, site not specified: Secondary | ICD-10-CM

## 2015-07-04 LAB — POCT URINALYSIS DIP (MANUAL ENTRY)
Bilirubin, UA: NEGATIVE
Blood, UA: NEGATIVE
Glucose, UA: NEGATIVE
Ketones, POC UA: NEGATIVE
Leukocytes, UA: NEGATIVE
Nitrite, UA: POSITIVE — AB
Protein Ur, POC: NEGATIVE
Spec Grav, UA: 1.02
Urobilinogen, UA: 0.2
pH, UA: 6

## 2015-07-04 LAB — POC MICROSCOPIC URINALYSIS (UMFC): Mucus: ABSENT

## 2015-07-04 MED ORDER — AMOXICILLIN-POT CLAVULANATE 875-125 MG PO TABS: 1.0000 | ORAL_TABLET | Freq: Two times a day (BID) | ORAL | Status: DC

## 2015-07-04 NOTE — Patient Instructions (Addendum)
Urinary Tract Infection Urinary tract infections (UTIs) can develop anywhere along your urinary tract. Your urinary tract is your body's drainage system for removing wastes and extra water. Your urinary tract includes two kidneys, two ureters, a bladder, and a urethra. Your kidneys are a pair of bean-shaped organs. Each kidney is about the size of your fist. They are located below your ribs, one on each side of your spine. CAUSES Infections are caused by microbes, which are microscopic organisms, including fungi, viruses, and bacteria. These organisms are so small that they can only be seen through a microscope. Bacteria are the microbes that most commonly cause UTIs. SYMPTOMS  Symptoms of UTIs may vary by age and gender of the patient and by the location of the infection. Symptoms in young women typically include a frequent and intense urge to urinate and a painful, burning feeling in the bladder or urethra during urination. Older women and men are more likely to be tired, shaky, and weak and have muscle aches and abdominal pain. A fever may mean the infection is in your kidneys. Other symptoms of a kidney infection include pain in your back or sides below the ribs, nausea, and vomiting. DIAGNOSIS To diagnose a UTI, your caregiver will ask you about your symptoms. Your caregiver will also ask you to provide a urine sample. The urine sample will be tested for bacteria and white blood cells. White blood cells are made by your body to help fight infection. TREATMENT  Typically, UTIs can be treated with medication. Because most UTIs are caused by a bacterial infection, they usually can be treated with the use of antibiotics. The choice of antibiotic and length of treatment depend on your symptoms and the type of bacteria causing your infection. HOME CARE INSTRUCTIONS  If you were prescribed antibiotics, take them exactly as your caregiver instructs you. Finish the medication even if you feel better after  you have only taken some of the medication.  Drink enough water and fluids to keep your urine clear or pale yellow.  Avoid caffeine, tea, and carbonated beverages. They tend to irritate your bladder.  Empty your bladder often. Avoid holding urine for long periods of time.  Empty your bladder before and after sexual intercourse.  After a bowel movement, women should cleanse from front to back. Use each tissue only once. SEEK MEDICAL CARE IF:   You have back pain.  You develop a fever.  Your symptoms do not begin to resolve within 3 days. SEEK IMMEDIATE MEDICAL CARE IF:   You have severe back pain or lower abdominal pain.  You develop chills.  You have nausea or vomiting.  You have continued burning or discomfort with urination. MAKE SURE YOU:   Understand these instructions.  Will watch your condition.  Will get help right away if you are not doing well or get worse.   This information is not intended to replace advice given to you by your health care provider. Make sure you discuss any questions you have with your health care provider.   Document Released: 01/25/2005 Document Revised: 01/06/2015 Document Reviewed: 05/26/2011 Elsevier Interactive Patient Education [employer]  Because you received labwork today, you will receive an invoice from [employer]. Please contact [facility] at [phone] with questions or concerns regarding your invoice. Our billing staff will not be able to assist you with those questions.  You will be contacted with the lab results as soon as they are available. The fastest way to get  your results is to activate your My Chart account. Instructions are located on the last page of this paperwork. If you have not heard from us regarding the results in 2 weeks, please contact this office.

## 2015-07-04 NOTE — Progress Notes (Signed)
    MRN: [id number] DOB: [birth year]  Subjective:   Cindy Jones is a 25 y.o. female presenting for chief complaint of Urinary Tract Infection  Reports 2 day history of dysuria, urinary frequency, urinary urgency and pelvic pain. Has tried Azo with some relief. Denies flank pain, abdominal pain, cloudy malordorous urine, genital rash, genital irritation and vaginal discharge, fever, n/v. Reports history of frequent UTIs. Has not had official work up with urology. Denies any other aggravating or relieving factors, no other questions or concerns.  Cindy Jones has a current medication list which includes the following prescription(s): amphetamine-dextroamphetamine, amphetamine-dextroamphetamine, amphetamine-dextroamphetamine, amphetamine-dextroamphetamine, amphetamine-dextroamphetamine, amphetamine-dextroamphetamine, aspirin-acetaminophen-caffeine, clotrimazole-betamethasone, fluticasone, loratadine, multiple vitamins-minerals, phenazopyridine, and phenazopyridine hcl. Patient is allergic to latex; naproxen; and sulfa antibiotics.  Cindy Jones  has a past medical history of Frequent headaches; Migraines; Urinary tract bacterial infections; Tourette disorder; Binge eating disorder; Seasonal allergies; and PID (acute pelvic inflammatory disease). Also  has past surgical history that includes Tonsillectomy and adenoidectomy (2013).  Objective:   Vitals: BP 120/80 mmHg  Pulse 69  Temp(Src) 98 F (36.7 C) (Oral)  Resp 18  Ht 5' 5.5" (1.664 m)  Wt 181 lb (82.101 kg)  BMI 29.65 kg/m2  SpO2 99%  LMP 06/30/2015 (Exact Date)  Physical Exam  Constitutional: She is oriented to person, place, and time. She appears well-developed and well-nourished.  Cardiovascular: Normal rate, regular rhythm and intact distal pulses.  Exam reveals no gallop and no friction rub.   No murmur heard. Pulmonary/Chest: No respiratory distress. She has no wheezes. She has no rales.  Abdominal: Soft. Bowel sounds are normal. She  exhibits no distension and no mass. There is no tenderness.  No CVA tenderness.  Neurological: She is alert and oriented to person, place, and time.  Skin: Skin is warm and dry.   Results for orders placed or performed in visit on 07/04/15 (from the past 24 hour(s))  POCT urinalysis dipstick     Status: Abnormal   Collection Time: 07/04/15 12:38 PM  Result Value Ref Range   Color, UA yellow yellow   Clarity, UA clear clear   Glucose, UA negative negative   Bilirubin, UA negative negative   Ketones, POC UA negative negative   Spec Grav, UA 1.020    Blood, UA negative negative   pH, UA 6.0    Protein Ur, POC negative negative   Urobilinogen, UA 0.2    Nitrite, UA Positive (A) Negative   Leukocytes, UA Negative Negative  POCT Microscopic Urinalysis ([facility])     Status: Abnormal   Collection Time: 07/04/15 12:38 PM  Result Value Ref Range   WBC,UR,HPF,POC None None WBC/hpf   RBC,UR,HPF,POC None None RBC/hpf   Bacteria Few (A) None, Too numerous to count   Mucus Absent Absent   Epithelial Cells, UR Per Microscopy Few (A) None, Too numerous to count cells/hpf   Assessment and Plan :   1. Cystitis 2. Frequency of urination 3. Frequent UTI 4. Dysuria - Start Augmentin based off of last urine culture that showed E. Coli, resistant strain. Advised aggressive hydration. Refer to urology for further work-up.  Wallis BambergMario British Moyd, PA-C [facility] [phone] 07/04/2015 12:31 PM

## 2015-07-05 LAB — URINE CULTURE

## 2015-07-07 ENCOUNTER — Encounter: Payer: Self-pay | Admitting: Urgent Care

## 2015-07-07 ENCOUNTER — Encounter: Payer: Self-pay | Admitting: Family Medicine

## 2015-07-09 ENCOUNTER — Encounter: Payer: Self-pay | Admitting: Urgent Care

## 2015-07-12 ENCOUNTER — Other Ambulatory Visit: Payer: Self-pay | Admitting: Urgent Care

## 2015-07-12 MED ORDER — CIPROFLOXACIN HCL 500 MG PO TABS: 500.0000 mg | ORAL_TABLET | Freq: Two times a day (BID) | ORAL | Status: DC

## 2015-07-14 ENCOUNTER — Other Ambulatory Visit (INDEPENDENT_AMBULATORY_CARE_PROVIDER_SITE_OTHER): Payer: BLUE CROSS/BLUE SHIELD

## 2015-07-14 ENCOUNTER — Encounter: Payer: Self-pay | Admitting: Family Medicine

## 2015-07-14 ENCOUNTER — Ambulatory Visit (INDEPENDENT_AMBULATORY_CARE_PROVIDER_SITE_OTHER): Payer: BLUE CROSS/BLUE SHIELD | Admitting: Family Medicine

## 2015-07-14 VITALS — BP 122/86 | HR 74 | Temp 98.1°F | Resp 20 | Ht 65.0 in | Wt 177.0 lb

## 2015-07-14 DIAGNOSIS — R1013 Epigastric pain: Secondary | ICD-10-CM

## 2015-07-14 DIAGNOSIS — R319 Hematuria, unspecified: Secondary | ICD-10-CM

## 2015-07-14 DIAGNOSIS — Z0000 Encounter for general adult medical examination without abnormal findings: Secondary | ICD-10-CM

## 2015-07-14 DIAGNOSIS — Z299 Encounter for prophylactic measures, unspecified: Secondary | ICD-10-CM | POA: Diagnosis not present

## 2015-07-14 DIAGNOSIS — Z23 Encounter for immunization: Secondary | ICD-10-CM

## 2015-07-14 DIAGNOSIS — N390 Urinary tract infection, site not specified: Secondary | ICD-10-CM

## 2015-07-14 LAB — COMPREHENSIVE METABOLIC PANEL
ALT: 22 U/L (ref 0–35)
AST: 20 U/L (ref 0–37)
Albumin: 4.7 g/dL (ref 3.5–5.2)
Alkaline Phosphatase: 44 U/L (ref 39–117)
BUN: 10 mg/dL (ref 6–23)
CO2: 29 mEq/L (ref 19–32)
Calcium: 9.6 mg/dL (ref 8.4–10.5)
Chloride: 102 mEq/L (ref 96–112)
Creatinine, Ser: 0.79 mg/dL (ref 0.40–1.20)
GFR: 94.52 mL/min (ref >60.00)
Glucose, Bld: 112 mg/dL — ABNORMAL HIGH (ref 70–99)
Potassium: 4.2 mEq/L (ref 3.5–5.1)
Sodium: 137 mEq/L (ref 135–145)
Total Bilirubin: 0.6 mg/dL (ref 0.2–1.2)
Total Protein: 7.5 g/dL (ref 6.0–8.3)

## 2015-07-14 LAB — CBC WITH DIFFERENTIAL/PLATELET
Basophils Absolute: 0.1 10*3/uL (ref 0.0–0.1)
Basophils Relative: 0.9 % (ref 0.0–3.0)
Eosinophils Absolute: 0 10*3/uL (ref 0.0–0.7)
Eosinophils Relative: 0.8 % (ref 0.0–5.0)
HCT: 40.2 % (ref 36.0–46.0)
Hemoglobin: 13.5 g/dL (ref 12.0–15.0)
Lymphocytes Relative: 34.2 % (ref 12.0–46.0)
Lymphs Abs: 2.2 10*3/uL (ref 0.7–4.0)
MCHC: 33.5 g/dL (ref 30.0–36.0)
MCV: 89.7 fl (ref 78.0–100.0)
Monocytes Absolute: 0.4 10*3/uL (ref 0.1–1.0)
Monocytes Relative: 6.7 % (ref 3.0–12.0)
Neutro Abs: 3.7 10*3/uL (ref 1.4–7.7)
Neutrophils Relative %: 57.4 % (ref 43.0–77.0)
Platelets: 218 10*3/uL (ref 150.0–400.0)
RBC: 4.48 Mil/uL (ref 3.87–5.11)
RDW: 13 % (ref 11.5–15.5)
WBC: 6.5 10*3/uL (ref 4.0–10.5)

## 2015-07-14 MED ORDER — OMEPRAZOLE 20 MG PO CPDR: 20.0000 mg | DELAYED_RELEASE_CAPSULE | Freq: Every day | ORAL | Status: DC

## 2015-07-14 NOTE — Patient Instructions (Signed)
Health Maintenance, Female Adopting a healthy lifestyle and getting preventive care can go a long way to promote health and wellness. Talk with your health care provider about what schedule of regular examinations is right for you. This is a good chance for you to check in with your provider about disease prevention and staying healthy. In between checkups, there are plenty of things you can do on your own. Experts have done a lot of research about which lifestyle changes and preventive measures are most likely to keep you healthy. Ask your health care provider for more information. WEIGHT AND DIET  Eat a healthy diet  Be sure to include plenty of vegetables, fruits, low-fat dairy products, and lean protein.  Do not eat a lot of foods high in solid fats, added sugars, or salt.  Get regular exercise. This is one of the most important things you can do for your health.  Most adults should exercise for at least 150 minutes each week. The exercise should increase your heart rate and make you sweat (moderate-intensity exercise).  Most adults should also do strengthening exercises at least twice a week. This is in addition to the moderate-intensity exercise.  Maintain a healthy weight  Body mass index (BMI) is a measurement that can be used to identify possible weight problems. It estimates body fat based on height and weight. Your health care provider can help determine your BMI and help you achieve or maintain a healthy weight.  For females 20 years of age and older:   A BMI below 18.5 is considered underweight.  A BMI of 18.5 to 24.9 is normal.  A BMI of 25 to 29.9 is considered overweight.  A BMI of 30 and above is considered obese.  Watch levels of cholesterol and blood lipids  You should start having your blood tested for lipids and cholesterol at 25 years of age, then have this test every 5 years.  You may need to have your cholesterol levels checked more often if:  Your lipid  or cholesterol levels are high.  You are older than 25 years of age.  You are at high risk for heart disease.  CANCER SCREENING   Lung Cancer  Lung cancer screening is recommended for adults 55-80 years old who are at high risk for lung cancer because of a history of smoking.  A yearly low-dose CT scan of the lungs is recommended for people who:  Currently smoke.  Have quit within the past 15 years.  Have at least a 30-pack-year history of smoking. A pack year is smoking an average of one pack of cigarettes a day for 1 year.  Yearly screening should continue until it has been 15 years since you quit.  Yearly screening should stop if you develop a health problem that would prevent you from having lung cancer treatment.  Breast Cancer  Practice breast self-awareness. This means understanding how your breasts normally appear and feel.  It also means doing regular breast self-exams. Let your health care provider know about any changes, no matter how small.  If you are in your 20s or 30s, you should have a clinical breast exam (CBE) by a health care provider every 1-3 years as part of a regular health exam.  If you are 40 or older, have a CBE every year. Also consider having a breast X-ray (mammogram) every year.  If you have a family history of breast cancer, talk to your health care provider about genetic screening.  If you   are at high risk for breast cancer, talk to your health care provider about having an MRI and a mammogram every year.  Breast cancer gene (BRCA) assessment is recommended for women who have family members with BRCA-related cancers. BRCA-related cancers include:  Breast.  Ovarian.  Tubal.  Peritoneal cancers.  Results of the assessment will determine the need for genetic counseling and BRCA1 and BRCA2 testing. Cervical Cancer Your health care provider may recommend that you be screened regularly for cancer of the pelvic organs (ovaries, uterus, and  vagina). This screening involves a pelvic examination, including checking for microscopic changes to the surface of your cervix (Pap test). You may be encouraged to have this screening done every 3 years, beginning at age 21.  For women ages 30-65, health care providers may recommend pelvic exams and Pap testing every 3 years, or they may recommend the Pap and pelvic exam, combined with testing for human papilloma virus (HPV), every 5 years. Some types of HPV increase your risk of cervical cancer. Testing for HPV may also be done on women of any age with unclear Pap test results.  Other health care providers may not recommend any screening for nonpregnant women who are considered low risk for pelvic cancer and who do not have symptoms. Ask your health care provider if a screening pelvic exam is right for you.  If you have had past treatment for cervical cancer or a condition that could lead to cancer, you need Pap tests and screening for cancer for at least 20 years after your treatment. If Pap tests have been discontinued, your risk factors (such as having a new sexual partner) need to be reassessed to determine if screening should resume. Some women have medical problems that increase the chance of getting cervical cancer. In these cases, your health care provider may recommend more frequent screening and Pap tests. Colorectal Cancer  This type of cancer can be detected and often prevented.  Routine colorectal cancer screening usually begins at 25 years of age and continues through 25 years of age.  Your health care provider may recommend screening at an earlier age if you have risk factors for colon cancer.  Your health care provider may also recommend using home test kits to check for hidden blood in the stool.  A small camera at the end of a tube can be used to examine your colon directly (sigmoidoscopy or colonoscopy). This is done to check for the earliest forms of colorectal  cancer.  Routine screening usually begins at age 50.  Direct examination of the colon should be repeated every 5-10 years through 25 years of age. However, you may need to be screened more often if early forms of precancerous polyps or small growths are found. Skin Cancer  Check your skin from head to toe regularly.  Tell your health care provider about any new moles or changes in moles, especially if there is a change in a mole's shape or color.  Also tell your health care provider if you have a mole that is larger than the size of a pencil eraser.  Always use sunscreen. Apply sunscreen liberally and repeatedly throughout the day.  Protect yourself by wearing long sleeves, pants, a wide-brimmed hat, and sunglasses whenever you are outside. HEART DISEASE, DIABETES, AND HIGH BLOOD PRESSURE   High blood pressure causes heart disease and increases the risk of stroke. High blood pressure is more likely to develop in:  People who have blood pressure in the high end   of the normal range (130-139/85-89 mm Hg).  People who are overweight or obese.  People who are African American.  If you are 38-23 years of age, have your blood pressure checked every 3-5 years. If you are 61 years of age or older, have your blood pressure checked every year. You should have your blood pressure measured twice--once when you are at a hospital or clinic, and once when you are not at a hospital or clinic. Record the average of the two measurements. To check your blood pressure when you are not at a hospital or clinic, you can use:  An automated blood pressure machine at a pharmacy.  A home blood pressure monitor.  If you are between 45 years and 39 years old, ask your health care provider if you should take aspirin to prevent strokes.  Have regular diabetes screenings. This involves taking a blood sample to check your fasting blood sugar level.  If you are at a normal weight and have a low risk for diabetes,  have this test once every three years after 25 years of age.  If you are overweight and have a high risk for diabetes, consider being tested at a younger age or more often. PREVENTING INFECTION  Hepatitis B  If you have a higher risk for hepatitis B, you should be screened for this virus. You are considered at high risk for hepatitis B if:  You were born in a country where hepatitis B is common. Ask your health care provider which countries are considered high risk.  Your parents were born in a high-risk country, and you have not been immunized against hepatitis B (hepatitis B vaccine).  You have HIV or AIDS.  You use needles to inject street drugs.  You live with someone who has hepatitis B.  You have had sex with someone who has hepatitis B.  You get hemodialysis treatment.  You take certain medicines for conditions, including cancer, organ transplantation, and autoimmune conditions. Hepatitis C  Blood testing is recommended for:  Everyone born from [birth year] through 1965.  Anyone with known risk factors for hepatitis C. Sexually transmitted infections (STIs)  You should be screened for sexually transmitted infections (STIs) including gonorrhea and chlamydia if:  You are sexually active and are younger than 24 years of age.  You are older than 25 years of age and your health care provider tells you that you are at risk for this type of infection.  Your sexual activity has changed since you were last screened and you are at an increased risk for chlamydia or gonorrhea. Ask your health care provider if you are at risk.  If you do not have HIV, but are at risk, it may be recommended that you take a prescription medicine daily to prevent HIV infection. This is called pre-exposure prophylaxis (PrEP). You are considered at risk if:  You are sexually active and do not regularly use condoms or know the HIV status of your partner(s).  You take drugs by injection.  You are sexually  active with a partner who has HIV. Talk with your health care provider about whether you are at high risk of being infected with HIV. If you choose to begin PrEP, you should first be tested for HIV. You should then be tested every 3 months for as long as you are taking PrEP.  PREGNANCY   If you are premenopausal and you may become pregnant, ask your health care provider about preconception counseling.  If you may  become pregnant, take 400 to 800 micrograms (mcg) of folic acid every day.  If you want to prevent pregnancy, talk to your health care provider about birth control (contraception). OSTEOPOROSIS AND MENOPAUSE   Osteoporosis is a disease in which the bones lose minerals and strength with aging. This can result in serious bone fractures. Your risk for osteoporosis can be identified using a bone density scan.  If you are 74 years of age or older, or if you are at risk for osteoporosis and fractures, ask your health care provider if you should be screened.  Ask your health care provider whether you should take a calcium or vitamin D supplement to lower your risk for osteoporosis.  Menopause may have certain physical symptoms and risks.  Hormone replacement therapy may reduce some of these symptoms and risks. Talk to your health care provider about whether hormone replacement therapy is right for you.  HOME CARE INSTRUCTIONS   Schedule regular health, dental, and eye exams.  Stay current with your immunizations.   Do not use any tobacco products including cigarettes, chewing tobacco, or electronic cigarettes.  If you are pregnant, do not drink alcohol.  If you are breastfeeding, limit how much and how often you drink alcohol.  Limit alcohol intake to no more than 1 drink per day for nonpregnant women. One drink equals 12 ounces of beer, 5 ounces of wine, or 1 ounces of hard liquor.  Do not use street drugs.  Do not share needles.  Ask your health care provider for help if  you need support or information about quitting drugs.  Tell your health care provider if you often feel depressed.  Tell your health care provider if you have ever been abused or do not feel safe at home.   This information is not intended to replace advice given to you by your health care provider. Make sure you discuss any questions you have with your health care provider.   Document Released: 10/31/2010 Document Revised: 05/08/2014 Document Reviewed: 03/19/2013 Elsevier Interactive Patient Education 2016 [employer]   Gastroesophageal Reflux Disease, Adult Normally, food travels down the esophagus and stays in the stomach to be digested. However, when a person has gastroesophageal reflux disease (GERD), food and stomach acid move back up into the esophagus. When this happens, the esophagus becomes sore and inflamed. Over time, GERD can create small holes (ulcers) in the lining of the esophagus.  CAUSES This condition is caused by a problem with the muscle between the esophagus and the stomach (lower esophageal sphincter, or LES). Normally, the LES muscle closes after food passes through the esophagus to the stomach. When the LES is weakened or abnormal, it does not close properly, and that allows food and stomach acid to go back up into the esophagus. The LES can be weakened by certain dietary substances, medicines, and medical conditions, including:  Tobacco use.  Pregnancy.  Having a hiatal hernia.  Heavy alcohol use.  Certain foods and beverages, such as coffee, chocolate, onions, and peppermint. RISK FACTORS This condition is more likely to develop in:  People who have an increased body weight.  People who have connective tissue disorders.  People who use NSAID medicines. SYMPTOMS Symptoms of this condition include:  Heartburn.  Difficult or painful swallowing.  The feeling of having a lump in the throat.  Abitter taste in the mouth.  Bad breath.  Having a  large amount of saliva.  Having an upset or bloated stomach.  Belching.  Chest  pain.  Shortness of breath or wheezing.  Ongoing (chronic) cough or a night-time cough.  Wearing away of tooth enamel.  Weight loss. Different conditions can cause chest pain. Make sure to see your health care provider if you experience chest pain. DIAGNOSIS Your health care provider will take a medical history and perform a physical exam. To determine if you have mild or severe GERD, your health care provider may also monitor how you respond to treatment. You may also have other tests, including:  An endoscopy toexamine your stomach and esophagus with a small camera.  A test thatmeasures the acidity level in your esophagus.  A test thatmeasures how much pressure is on your esophagus.  A barium swallow or modified barium swallow to show the shape, size, and functioning of your esophagus. TREATMENT The goal of treatment is to help relieve your symptoms and to prevent complications. Treatment for this condition may vary depending on how severe your symptoms are. Your health care provider may recommend:  Changes to your diet.  Medicine.  Surgery. HOME CARE INSTRUCTIONS Diet  Follow a diet as recommended by your health care provider. This may involve avoiding foods and drinks such as:  Coffee and tea (with or without caffeine).  Drinks that containalcohol.  Energy drinks and sports drinks.  Carbonated drinks or sodas.  Chocolate and cocoa.  Peppermint and mint flavorings.  Garlic and onions.  Horseradish.  Spicy and acidic foods, including peppers, chili powder, curry powder, vinegar, hot sauces, and barbecue sauce.  Citrus fruit juices and citrus fruits, such as oranges, lemons, and limes.  Tomato-based foods, such as red sauce, chili, salsa, and pizza with red sauce.  Fried and fatty foods, such as donuts, french fries, potato chips, and high-fat dressings.  High-fat meats,  such as hot dogs and fatty cuts of red and white meats, such as rib eye steak, sausage, ham, and bacon.  High-fat dairy items, such as whole milk, butter, and cream cheese.  Eat small, frequent meals instead of large meals.  Avoid drinking large amounts of liquid with your meals.  Avoid eating meals during the 2-3 hours before bedtime.  Avoid lying down right after you eat.  Do not exercise right after you eat. General Instructions  Pay attention to any changes in your symptoms.  Take over-the-counter and prescription medicines only as told by your health care provider. Do not take aspirin, ibuprofen, or other NSAIDs unless your health care provider told you to do so.  Do not use any tobacco products, including cigarettes, chewing tobacco, and e-cigarettes. If you need help quitting, ask your health care provider.  Wear loose-fitting clothing. Do not wear anything tight around your waist that causes pressure on your abdomen.  Raise (elevate) the head of your bed 6 inches (15cm).  Try to reduce your stress, such as with yoga or meditation. If you need help reducing stress, ask your health care provider.  If you are overweight, reduce your weight to an amount that is healthy for you. Ask your health care provider for guidance about a safe weight loss goal.  Keep all follow-up visits as told by your health care provider. This is important. SEEK MEDICAL CARE IF:  You have new symptoms.  You have unexplained weight loss.  You have difficulty swallowing, or it hurts to swallow.  You have wheezing or a persistent cough.  Your symptoms do not improve with treatment.  You have a hoarse voice. SEEK IMMEDIATE MEDICAL CARE IF:  You have  pain in your arms, neck, jaw, teeth, or back.  You feel sweaty, dizzy, or light-headed.  You have chest pain or shortness of breath.  You vomit and your vomit looks like blood or coffee grounds.  You faint.  Your stool is bloody or  black.  You cannot swallow, drink, or eat.   This information is not intended to replace advice given to you by your health care provider. Make sure you discuss any questions you have with your health care provider.   Document Released: 01/25/2005 Document Revised: 01/06/2015 Document Reviewed: 08/12/2014 Elsevier Interactive Patient Education 2016 [place] for Gastroesophageal Reflux Disease, Adult When you have gastroesophageal reflux disease (GERD), the foods you eat and your eating habits are very important. Choosing the right foods can help ease the discomfort of GERD. WHAT GENERAL GUIDELINES DO I NEED TO FOLLOW?  Choose fruits, vegetables, whole grains, low-fat dairy products, and low-fat meat, fish, and poultry.  Limit fats such as oils, salad dressings, butter, nuts, and avocado.  Keep a food diary to identify foods that cause symptoms.  Avoid foods that cause reflux. These may be different for different people.  Eat frequent small meals instead of three large meals each day.  Eat your meals slowly, in a relaxed setting.  Limit fried foods.  Cook foods using methods other than frying.  Avoid drinking alcohol.  Avoid drinking large amounts of liquids with your meals.  Avoid bending over or lying down until 2-3 hours after eating. WHAT FOODS ARE NOT RECOMMENDED? The following are some foods and drinks that may worsen your symptoms: Vegetables Tomatoes. Tomato juice. Tomato and spaghetti sauce. Chili peppers. Onion and garlic. Horseradish. Fruits Oranges, grapefruit, and lemon (fruit and juice). Meats High-fat meats, fish, and poultry. This includes hot dogs, ribs, ham, sausage, salami, and bacon. Dairy Whole milk and chocolate milk. Sour cream. Cream. Butter. Ice cream. Cream cheese.  Beverages Coffee and tea, with or without caffeine. Carbonated beverages or energy drinks. Condiments Hot sauce. Barbecue sauce.  Sweets/Desserts Chocolate  and cocoa. Donuts. Peppermint and spearmint. Fats and Oils High-fat foods, including [place] fries and potato chips. Other Vinegar. Strong spices, such as black pepper, white pepper, red pepper, cayenne, curry powder, cloves, ginger, and chili powder. The items listed above may not be a complete list of foods and beverages to avoid. Contact your dietitian for more information.   This information is not intended to replace advice given to you by your health care provider. Make sure you discuss any questions you have with your health care provider.   Document Released: 04/17/2005 Document Revised: 05/08/2014 Document Reviewed: 02/19/2013 Elsevier Interactive Patient Education 2016 [employer] PPI for 8 weeks and create food diary surrounding your symptoms.  We will call you with results of labs once available.

## 2015-07-14 NOTE — Progress Notes (Signed)
Patient ID: Cindy Jones, female   DOB: Jun 28, 1990, 25 y.o.   MRN: [id number]      Patient ID: Cindy Jones, female  DOB: 1990/09/24, 25 y.o.   MRN: [id number]  Subjective:  Cindy Jones is a 25 y.o. female present for annual exam. All past medical history, surgical history, allergies, family history, immunizations, medications and social history were updated in the electronic medical record today. All recent labs, ED visits and hospitalizations within the last year were reviewed.  Frequent UTI: Pt has had multiple urgent care visits over the last few months for urinary tract-like symptoms. She was treated for E.Coli UTI, with Keflex, which was sensitive to in November by this provider. And then seen in January and March for repeat UTI-like symptoms without significant bacterial growth. She has been treated for the UTIs with Augmentin and currently Cipro. She does have a urology referral in place for evaluation of frequent UTIs. Patient describes urinary frequency, dysuria and incomplete emptying of bladder.   Epigastric pain: She states she noted approximately 2 months ago that she had sharp epigastric pain, with repeat symptoms 1 month later. Over the last week she feels like she has had heartburn, belching and epigastric pain more frequently, but not daily. She reports taking Tums and GasX to help, but this has not been successful. She reports the pain as burning sensation/indigestion in her stomach and chest. She states a few days ago it was so bad it kept her awake. She feels laying flat increases her symptoms. She endorses eating spicy foods for the majority of her meals. She does not have a history of reflux. She states she's never had heartburn before. She endorses having bulimia as a younger child. She has been under therapy/counseling and has not purged in years. She states she is worried because she went on my chart reviewed some of her old imaging studies and noted in 2015 she had  gallstones and gallbladder sludge. She was not being worked up for right upper quadrant pain or nausea at that time, she was having pelvic pain. Patient denies any nausea, vomit, constipation, diarrhea, fever, chills, night sweats or unintentional weight loss.  Health maintenance:  Patient's last menstrual period was 06/30/2015 (exact date). Colonoscopy: No FHX, screen age 35 Mammogram:No FHX, screen at age 89 Cervical cancer screening: 2015 transition zone absent, reparative cells. Completed by GYN (Dr. Hulan Fray) Immunizations: Td indicated. Flu UTD 2016 Infectious disease screening: HIV completed Assistive device: None Oxygen use: No Patient has a Dental home. Hospitalizations/ED visits: No, urgent care visits for UTI and referral to urology for frequent UTI.  Past Medical History  Diagnosis Date  . Frequent headaches   . Migraines   . Urinary tract bacterial infections   . Tourette disorder   . Binge eating disorder   . Seasonal allergies   . PID (acute pelvic inflammatory disease)    Allergies  Allergen Reactions  . Latex Swelling and Rash  . Naproxen   . Sulfa Antibiotics Hives and Swelling   Past Surgical History  Procedure Laterality Date  . Tonsillectomy and adenoidectomy  2013   Family History  Problem Relation Age of Onset  . Alcohol abuse Mother   . Bipolar disorder Mother   . Cancer Father     sarcoma  . Tourette syndrome Maternal Aunt   . Alcohol abuse Maternal Aunt   . Hyperlipidemia Paternal Aunt   . Tourette syndrome Maternal Grandfather   . Heart disease Paternal Grandfather   .  Alcohol abuse Maternal Uncle   . Suicidality Neg Hx    Social History   Social History  . Marital Status: Single    Spouse Name: N/A  . Number of Children: 0  . Years of Education: N/A   Occupational History  . Not on file.   Social History Main Topics  . Smoking status: Never Smoker   . Smokeless tobacco: Never Used  . Alcohol Use: 1.2 oz/week    2 Glasses of wine  per week  . Drug Use: No  . Sexual Activity:    Partners: Male    Patent examiner Protection: None, Condom     Comment: Married   Other Topics Concern  . Not on file   Social History Narrative   - Married, no children.   Nurse, mental health education.   - She works FT as a Physiological scientist.    - Her father died when she was 46 yo. Her mother is an alcoholic.    - Her aunt & uncle were her guardians when she was in [place]. They live in [place], [place].    - She has a fraternal twin sister.    - Wears her seatbelt, smoke detectors in the home.    ROS: Negative, with the exception of above mentioned in HPI  Objective: BP 122/86 mmHg  Pulse 74  Temp(Src) 98.1 F (36.7 C)  Resp 20  Ht 5' 5"  (1.651 m)  Wt 177 lb (80.287 kg)  BMI 29.45 kg/m2  SpO2 100%  LMP 06/30/2015 (Exact Date) Gen: Afebrile. No acute distress. Nontoxic in appearance, well-developed, well-nourished, female, Anxious, Caucasian female. HENT: AT. [place]. Bilateral TM visualized and normal in appearance, normal external auditory canal. MMM, no oral lesions, good dentition. Bilateral nares without erythema or swelling. Throat without erythema, ulcerations or exudates. No Cough on exam, no hoarseness on exam. Eyes:Pupils Equal Round Reactive to light, Extraocular movements intact,  Conjunctiva without redness, discharge or icterus. Neck/lymp/endocrine: Supple, no lymphadenopathy, no thyromegaly CV: RRR 1/6 systolic murmur, no edema, +2/4 P posterior tibialis pulses.  Chest: CTAB, no wheeze, rhonchi or crackles. Normal Respiratory effort. No Air movement. Abd: Soft. Flat. Mild epigastric tenderness, ND. BS present. No Masses palpated. No hepatosplenomegaly. No rebound tenderness or guarding. Skin: No rashes, purpura or petechiae. Warm and well-perfused. Skin intact. Neuro/Msk:  Normal gait. PERLA. EOMi. Alert. Oriented x3. DTRs equal bilaterally. Psych: Anxious. Normal affect, dress and demeanor. Normal speech. Normal thought content and  judgment.  Assessment/plan: Cindy Jones is a 25 y.o. female present for annual exam.  Encounter for preventive measure/Routine health maintenance Colonoscopy: No FHX, screen age 25 Mammogram:No FHX, screen at age 40 Cervical cancer screening: 2015 transition zone absent, reparative cells. Completed by GYN (Dr. Dove)--> pt encouraged to make appt for PAP with GYN Immunizations: Td indicated. Flu UTD 2016 Infectious disease screening: HIV completed - Td : Tetanus/diphtheria >7yo Preservative  Free Patient was encouraged to exercise greater than 150 minutes a week. Patient was encouraged to choose a diet filled with fresh fruits and vegetables, and lean meats. AVS provided to patient today for education/recommendation on gender specific health and safety maintenance.  Epigastric discomfort - Patient is to start a food diary log surrounding a reoccurrence of her symptoms to attempt to identify any triggers. - Start omeprazole daily for 8 weeks. - CBC w/Diff; Future - Comp Met (CMET); Future - H. pylori antibody, IgG; Future - Follow-up 8 weeks if no improvement, sooner if worsening.  Urinary tract infection  with hematuria, site unspecified - Encouraged her to attend appointment with urology in April.  - Discussed UTI versus cystitis. Patient was provided with a list of common bladder irritants. She is to avoid anything on this list if possible.   Patient was encouraged to exercise greater than 150 minutes a week. Patient was encouraged to choose a diet filled with fresh fruits and vegetables, and lean meats. AVS provided to patient today for education/recommendation on gender specific health and safety maintenance.  Return in about 1 year (around 07/13/2016) for CPE.  Follow-up in 8 weeks for epigastric discomfort  Electronically signed by: Howard Pouch, DO [place]

## 2015-07-15 ENCOUNTER — Telehealth: Payer: Self-pay | Admitting: Family Medicine

## 2015-07-15 LAB — H. PYLORI ANTIBODY, IGG: H Pylori IgG: NEGATIVE

## 2015-07-15 NOTE — Telephone Encounter (Signed)
Spoke with patient reviewed lab work and instructions.

## 2015-07-15 NOTE — Telephone Encounter (Signed)
Please call pt: - her labs are normal.  - she is to take the omeprazole daily/follow GERD diet, make food diary log when she has symptoms and follow up in 8 weeks.

## 2015-07-20 ENCOUNTER — Encounter (HOSPITAL_COMMUNITY): Payer: Self-pay | Admitting: Psychiatry

## 2015-07-20 ENCOUNTER — Ambulatory Visit (INDEPENDENT_AMBULATORY_CARE_PROVIDER_SITE_OTHER): Payer: BLUE CROSS/BLUE SHIELD | Admitting: Psychiatry

## 2015-07-20 VITALS — BP 116/81 | HR 66 | Ht 65.5 in | Wt 179.2 lb

## 2015-07-20 DIAGNOSIS — F900 Attention-deficit hyperactivity disorder, predominantly inattentive type: Secondary | ICD-10-CM

## 2015-07-20 DIAGNOSIS — F952 Tourette's disorder: Secondary | ICD-10-CM | POA: Diagnosis not present

## 2015-07-20 MED ORDER — AMPHETAMINE-DEXTROAMPHETAMINE 10 MG PO TABS
10.0000 mg | ORAL_TABLET | Freq: Every day | ORAL | Status: DC
Start: 2015-07-20 — End: 2015-10-19

## 2015-07-20 MED ORDER — AMPHETAMINE-DEXTROAMPHET ER 20 MG PO CP24
20.0000 mg | ORAL_CAPSULE | Freq: Every day | ORAL | Status: DC
Start: 2015-07-20 — End: 2015-10-19

## 2015-07-20 MED ORDER — AMPHETAMINE-DEXTROAMPHETAMINE 10 MG PO TABS: 10.0000 mg | ORAL_TABLET | Freq: Every day | ORAL | Status: DC

## 2015-07-20 MED ORDER — AMPHETAMINE-DEXTROAMPHETAMINE 10 MG PO TABS
10.0000 mg | ORAL_TABLET | Freq: Every day | ORAL | Status: DC
Start: 2015-07-20 — End: 2015-10-11

## 2015-07-20 MED ORDER — AMPHETAMINE-DEXTROAMPHET ER 20 MG PO CP24: 20.0000 mg | ORAL_CAPSULE | Freq: Every day | ORAL | Status: DC

## 2015-07-20 NOTE — Progress Notes (Signed)
Patient ID: Cindy Jones, female   DOB: 01/22/1991, 25 y.o.   MRN: [id number]  [facility] [id number] Progress Note  Cindy Jones [id number] 25 y.o.  07/20/2015 8:43 AM  Chief Complaint: "good"  History of Present Illness: Pt got a new job and is very happy. She likes it a lot.   Pt got married and is happy.   Pt reports she is having urinary urgency and is going to the urologist later today for a workup .   States she feels stable emotionally but she has a little situational depression. Pt denies depression. Denies anhedonia, isolation, crying spells, low motivation, poor hygiene, worthlessness and hopelessness.   Sleep, appetite, energy are good. Pt is eating 3 meals a day and denies any symptoms of bulmia.  Adderall is working well and concentration is good. Notes if she doesn't take her second dose then she is more distracted and disconnected in the afternoons. Pt often forgets the second dose.  Denies irritability and depression. Denies GI upset and HA.   Tourettes is pretty much non-existant during the day except when the Adderall wears off. It centers around eye blinking (first symptom), chest tightening and flaring her nostrils. On days she is tired, upset and stressed she has more symptoms. Pt has been more stressed due to work and is noticing more eye blinking in the evenings. Pt went to the eye doctor and was told that her vision doesn't focus at the same time so she got glasses. Headaches have decreased.   Taking meds as prescribed and denies SE.   Suicidal Ideation: No Plan Formed: No Patient has means to carry out plan: No  Homicidal Ideation: No Plan Formed: No Patient has means to carry out plan: No  Review of Systems: Psychiatric: Agitation: No Hallucination: No Depressed Mood: No Insomnia: No Hypersomnia: No Altered Concentration: No Feels Worthless: No Grandiose Ideas: No Belief In Special Powers: No New/Increased Substance Abuse:  No Compulsions: No  Neurologic: Headache: No Seizure: No Paresthesias: No  Review of Systems  Constitutional: Negative for fever and chills.  HENT: Negative for congestion, ear pain, nosebleeds and sore throat.   Eyes: Negative for blurred vision, double vision and pain.  Respiratory: Negative for cough, sputum production and wheezing.   Cardiovascular: Negative for chest pain, palpitations and leg swelling.  Gastrointestinal: Negative for heartburn, nausea, vomiting and abdominal pain.  Genitourinary: Positive for urgency. Negative for dysuria, frequency, hematuria and flank pain.  Musculoskeletal: Negative for back pain, joint pain and neck pain.  Skin: Negative for itching and rash.  Neurological: Negative for dizziness, sensory change, seizures, loss of consciousness, weakness and headaches.  Psychiatric/Behavioral: Negative for depression, suicidal ideas, hallucinations and substance abuse. The patient is not nervous/anxious and does not have insomnia.      Past Medical, Family, Social History: lives alone in [place]. Works at [employer]. Raised in [place] then in [place] in HS. Father diagnosed with cancer when she was 5 and he passed away when she was 8yo. Mother became alcoholic after he passed. After he passed she went back and forth to her grandparents. Body image issues due to hyper focused step grandmother on weight. Family Members: mom and Twin sister. Close to paternal aunt and uncle.  reports that she has never smoked. She has never used smokeless tobacco. She reports that she drinks about 1.2 oz of alcohol per week. She reports that she does not use illicit drugs.  Family History  Problem Relation Age of Onset  .  Alcohol abuse Mother   . Bipolar disorder Mother   . Cancer Father     sarcoma  . Tourette syndrome Maternal Aunt   . Alcohol abuse Maternal Aunt   . Hyperlipidemia Paternal Aunt   . Tourette syndrome Maternal Grandfather   . Heart disease Paternal  Grandfather   . Alcohol abuse Maternal Uncle   . Suicidality Neg Hx     Past Medical History  Diagnosis Date  . Frequent headaches   . Migraines   . Urinary tract bacterial infections   . Tourette disorder   . Binge eating disorder   . Seasonal allergies   . PID (acute pelvic inflammatory disease)      Outpatient Encounter Prescriptions as of 07/20/2015  Medication Sig  . amphetamine-dextroamphetamine (ADDERALL XR) 20 MG 24 hr capsule Take 1 capsule (20 mg total) by mouth daily.  [name] amphetamine-dextroamphetamine (ADDERALL) 10 MG tablet Take 1 tablet (10 mg total) by mouth daily with lunch.  [name] aspirin-acetaminophen-caffeine (EXCEDRIN MIGRAINE) 250-250-65 MG per tablet Take 2 tablets by mouth every 6 (six) hours as needed for headache.  . ciprofloxacin (CIPRO) 500 MG tablet Take 1 tablet (500 mg total) by mouth 2 (two) times daily.  . clotrimazole-betamethasone (LOTRISONE) cream Reported on 05/10/2015  . loratadine (CLARITIN) 10 MG tablet Take 1 tablet (10 mg total) by mouth daily.  [name] omeprazole (PRILOSEC) 20 MG capsule Take 1 capsule (20 mg total) by mouth daily.  . Phenazopyridine HCl (AZO TABS PO) Take by mouth. Reported on 07/04/2015  . fluticasone (FLONASE) 50 MCG/ACT nasal spray Place 2 sprays into both nostrils daily. (Patient not taking: Reported on 07/14/2015)   No facility-administered encounter medications on file as of 07/20/2015.    Past Psychiatric History/Hospitalization(s): Anxiety: Yes Bipolar Disorder: No Depression: Yes Mania: No Psychosis: No Schizophrenia: No Personality Disorder: No Hospitalization for psychiatric illness: No History of Electroconvulsive Shock Therapy: No Prior Suicide Attempts: No  Physical Exam: Constitutional:  BP 116/81 mmHg  Pulse 66  Ht 5' 5.5" (1.664 m)  Wt 179 lb 3.2 oz (81.285 kg)  BMI 29.36 kg/m2  LMP 06/30/2015 (Exact Date)  General Appearance: alert, oriented, no acute distress and well  nourished  Musculoskeletal: Strength & Muscle Tone: within normal limits Gait & Station: normal Patient leans: straight  Mental Status Examination/Evaluation: Objective: Attitude: Calm and cooperative  Appearance: Fairly Groomed, appears to be stated age  Eye Contact::  Good  Speech:  Clear and Coherent and Normal Rate  Volume:  Normal  Mood:  euthymic  Affect:  Full Range  Thought Process:  Goal Directed, Linear and Logical  Orientation:  Full (Time, Place, and Person)  Thought Content:  Negative  Suicidal Thoughts:  No  Homicidal Thoughts:  No  Judgement:  Fair  Insight:  Fair  Concentration: good  Memory: Immediate-good Recent-good Remote-good  Recall: fair  Language: fair  Gait and Station: normal  [employer] of Knowledge: average  Psychomotor Activity:  Normal  Akathisia:  No  Handed:  Right  AIMS (if indicated): n/a  Assets:  Manufacturing systems engineer Desire for Improvement Financial Resources/Insurance Housing Intimacy Leisure Time Physical Health Resilience Social Support Veterinary surgeon Decision Making (Choose Three): Established Problem, Stable/Improving (1), Review of Psycho-Social Stressors (1), Review of Medication Regimen & Side Effects (2) and Review of New Medication or Change in Dosage (2)  Assessment: AXIS I ADHD- inattentive type and Tourettes disorder. Hx of Bulemia; MDD-resolved  AXIS II Deferred   Treatment  Plan/Recommendations:  Plan of Care: Medication management with supportive therapy. Risks/benefits and SE of the medication discussed. Pt verbalized understanding and verbal consent obtained for treatment. Affirm with the patient that the medications are taken as ordered. Patient expressed understanding of how their medications were to be used.      Laboratory: Reviewed labs with pt- 12/24/2013 WNL; EKG sinus arrhythmia, NSTEMI  Psychotherapy: Therapy: brief supportive  therapy provided. Discussed psychosocial stressors in detail.    Medications: Adderall XR 20mg  po qD and Adderall to 10mg  qLunch as needed for ADD. Pt wants to have kids in the next 3 yrs. Pt understands the medication can have teratogenic effects and will discuss with MD prior to trying to get pregnant.  May consider adding small dose of Zyprexa for Tourettes in the future if needed  Routine PRN Medications: No  Consultations: none at this time  Safety Concerns: Pt denies SI and is at an acute low risk for suicide.Patient told to call clinic if any problems occur. Patient advised to go to ER if they should develop SI/HI, side effects, or if symptoms worsen. Has crisis numbers to call if needed. Pt verbalized understanding.   Other: F/up in 3 months or sooner if needed    Oletta DarterSalina Neyda Durango, MD 07/20/2015

## 2015-08-17 DIAGNOSIS — R3911 Hesitancy of micturition: Secondary | ICD-10-CM | POA: Diagnosis not present

## 2015-08-17 DIAGNOSIS — Z87440 Personal history of urinary (tract) infections: Secondary | ICD-10-CM | POA: Diagnosis not present

## 2015-08-17 DIAGNOSIS — N760 Acute vaginitis: Secondary | ICD-10-CM | POA: Diagnosis not present

## 2015-08-17 DIAGNOSIS — Z0000 Encounter for general adult medical examination without abnormal findings: Secondary | ICD-10-CM | POA: Diagnosis not present

## 2015-09-07 DIAGNOSIS — R3915 Urgency of urination: Secondary | ICD-10-CM | POA: Diagnosis not present

## 2015-09-07 DIAGNOSIS — Z87440 Personal history of urinary (tract) infections: Secondary | ICD-10-CM | POA: Diagnosis not present

## 2015-09-07 DIAGNOSIS — R3911 Hesitancy of micturition: Secondary | ICD-10-CM | POA: Diagnosis not present

## 2015-09-07 DIAGNOSIS — Z0000 Encounter for general adult medical examination without abnormal findings: Secondary | ICD-10-CM | POA: Diagnosis not present

## 2015-10-11 ENCOUNTER — Ambulatory Visit (INDEPENDENT_AMBULATORY_CARE_PROVIDER_SITE_OTHER): Payer: BLUE CROSS/BLUE SHIELD | Admitting: Family Medicine

## 2015-10-11 ENCOUNTER — Encounter: Payer: Self-pay | Admitting: Family Medicine

## 2015-10-11 VITALS — BP 113/77 | HR 67 | Temp 98.2°F | Resp 20 | Ht 65.5 in | Wt 179.0 lb

## 2015-10-11 DIAGNOSIS — K219 Gastro-esophageal reflux disease without esophagitis: Secondary | ICD-10-CM | POA: Diagnosis not present

## 2015-10-11 MED ORDER — OMEPRAZOLE 20 MG PO CPDR: 20.0000 mg | DELAYED_RELEASE_CAPSULE | Freq: Two times a day (BID) | ORAL | Status: DC

## 2015-10-11 NOTE — Progress Notes (Signed)
Patient ID: Cindy Jones, female   DOB: 1991-03-02, 25 y.o.   MRN: [id number]    Cindy Jones , 1991-03-02, 25 y.o., female MRN: [id number]  CC: epigastric pain  Subjective:  F/U epigstric pain: pt returns today for follow up to her epigastric pain. She states she was doing rather well on the PPI  And had complete resolution of symptoms. She has been following a GERD diet. She no longer eats within a 3 hour window of laying down. She does notice heavy or large meals can reproduce symptoms.  She  identified grapes, lemon water and spicy foods  As triggers. She endorses a "sour" stomach when her diet slips. She states last week her reflux worsened. She had recently reduced her PPI to intermittent use. She has also been using Excedrin for her headaches on a more regular basis.  H.Pylori negative a few months ago. Pt has been a bulimic in the past, denies purging for years.   Allergies  Allergen Reactions  . Latex Swelling and Rash  . Naproxen   . Sulfa Antibiotics Hives and Swelling   Social History  Substance Use Topics  . Smoking status: Never Smoker   . Smokeless tobacco: Never Used  . Alcohol Use: 1.2 oz/week    2 Glasses of wine per week   Past Medical History  Diagnosis Date  . Frequent headaches   . Migraines   . Urinary tract bacterial infections   . Tourette disorder   . Binge eating disorder   . Seasonal allergies   . PID (acute pelvic inflammatory disease)    Past Surgical History  Procedure Laterality Date  . Tonsillectomy and adenoidectomy  2013   Family History  Problem Relation Age of Onset  . Alcohol abuse Mother   . Bipolar disorder Mother   . Cancer Father     sarcoma  . Tourette syndrome Maternal Aunt   . Alcohol abuse Maternal Aunt   . Hyperlipidemia Paternal Aunt   . Tourette syndrome Maternal Grandfather   . Heart disease Paternal Grandfather   . Alcohol abuse Maternal Uncle   . Suicidality Neg Hx      Medication List       This list is  accurate as of: 10/11/15  3:08 PM.  Always use your most recent med list.               amphetamine-dextroamphetamine 10 MG tablet  Commonly known as:  ADDERALL  Take 1 tablet (10 mg total) by mouth daily.     amphetamine-dextroamphetamine 20 MG 24 hr capsule  Commonly known as:  ADDERALL XR  Take 1 capsule (20 mg total) by mouth daily.     aspirin-acetaminophen-caffeine 250-250-65 MG tablet  Commonly known as:  EXCEDRIN MIGRAINE  Take 2 tablets by mouth every 6 (six) hours as needed for headache.     clotrimazole-betamethasone cream  Commonly known as:  LOTRISONE  Reported on 05/10/2015     fluticasone 50 MCG/ACT nasal spray  Commonly known as:  FLONASE  Place 2 sprays into both nostrils daily.     loratadine 10 MG tablet  Commonly known as:  CLARITIN  Take 1 tablet (10 mg total) by mouth daily.     omeprazole 20 MG capsule  Commonly known as:  PRILOSEC  Take 1 capsule (20 mg total) by mouth daily.     URIBEL 118 MG Caps        ROS: Negative, with the exception of above mentioned in HPI  Objective:  BP 113/77 mmHg  Pulse 67  Temp(Src) 98.2 F (36.8 C) (Oral)  Resp 20  Ht 5' 5.5" (1.664 m)  Wt 179 lb (81.194 kg)  BMI 29.32 kg/m2  SpO2 99% Body mass index is 29.32 kg/(m^2). Gen: Afebrile. No acute distress. Nontoxic in appearance, well developed well nourished female.  HENT: AT. [place].  MMM, no oral lesions.  Eyes:Pupils Equal Round Reactive to light, Extraocular movements intact,  Conjunctiva without redness, discharge or icterus. Neck/lymp/endocrine: Supple,no lymphadenopathy CV: RRR  Chest: CTAB, no wheeze or crackles. Abd: Soft. Round. ND. TTP just below sternum.  BS present. No Masses palpated.    Assessment/Plan: [name] is a 25 y.o. female present for acute OV for  Gastroesophageal reflux disease, esophagitis presence not specified - discussed with pt if symptoms do not resolve would favor sending to GI, since she was a bulimic, she may need to  have EGD and I would not want her to have continued nausea that may cause her to start desiring to purge.  - GERD diet  - restart PPI at high dose for 4 weeks, then taper to 20 mg thereafter. MUST avoid known triggers.  - AVOID NSAIDS; including Excedrin  - omeprazole (PRILOSEC) 20 MG capsule; Take 1 capsule (20 mg total) by mouth daily.  Dispense: 30 capsule; Refill: 3 - f/u 2 weeks, GI referral f needed   > 25 minutes spent with patient, >50% of time spent face to face counseling patient and coordinating care.   electronically signed by:  Felix Pacini, DO  [facility] [place]

## 2015-10-11 NOTE — Patient Instructions (Signed)
Avoiding known triggers.  Avoid aspirin, aleve, ibuprofen, goody powder etc.  Follow GERD diet.   Will start omeprazole 40 mg daily for 4 weeks, then 20 mg daily after.   Gastroesophageal Reflux Disease, Adult Normally, food travels down the esophagus and stays in the stomach to be digested. However, when a person has gastroesophageal reflux disease (GERD), food and stomach acid move back up into the esophagus. When this happens, the esophagus becomes sore and inflamed. Over time, GERD can create small holes (ulcers) in the lining of the esophagus.  CAUSES This condition is caused by a problem with the muscle between the esophagus and the stomach (lower esophageal sphincter, or LES). Normally, the LES muscle closes after food passes through the esophagus to the stomach. When the LES is weakened or abnormal, it does not close properly, and that allows food and stomach acid to go back up into the esophagus. The LES can be weakened by certain dietary substances, medicines, and medical conditions, including:  Tobacco use.  Pregnancy.  Having a hiatal hernia.  Heavy alcohol use.  Certain foods and beverages, such as coffee, chocolate, onions, and peppermint. RISK FACTORS This condition is more likely to develop in:  People who have an increased body weight.  People who have connective tissue disorders.  People who use NSAID medicines. SYMPTOMS Symptoms of this condition include:  Heartburn.  Difficult or painful swallowing.  The feeling of having a lump in the throat.  Abitter taste in the mouth.  Bad breath.  Having a large amount of saliva.  Having an upset or bloated stomach.  Belching.  Chest pain.  Shortness of breath or wheezing.  Ongoing (chronic) cough or a night-time cough.  Wearing away of tooth enamel.  Weight loss. Different conditions can cause chest pain. Make sure to see your health care provider if you experience chest pain. DIAGNOSIS Your health  care provider will take a medical history and perform a physical exam. To determine if you have mild or severe GERD, your health care provider may also monitor how you respond to treatment. You may also have other tests, including:  An endoscopy toexamine your stomach and esophagus with a small camera.  A test thatmeasures the acidity level in your esophagus.  A test thatmeasures how much pressure is on your esophagus.  A barium swallow or modified barium swallow to show the shape, size, and functioning of your esophagus. TREATMENT The goal of treatment is to help relieve your symptoms and to prevent complications. Treatment for this condition may vary depending on how severe your symptoms are. Your health care provider may recommend:  Changes to your diet.  Medicine.  Surgery. HOME CARE INSTRUCTIONS Diet  Follow a diet as recommended by your health care provider. This may involve avoiding foods and drinks such as:  Coffee and tea (with or without caffeine).  Drinks that containalcohol.  Energy drinks and sports drinks.  Carbonated drinks or sodas.  Chocolate and cocoa.  Peppermint and mint flavorings.  Garlic and onions.  Horseradish.  Spicy and acidic foods, including peppers, chili powder, curry powder, vinegar, hot sauces, and barbecue sauce.  Citrus fruit juices and citrus fruits, such as oranges, lemons, and limes.  Tomato-based foods, such as red sauce, chili, salsa, and pizza with red sauce.  Fried and fatty foods, such as donuts, french fries, potato chips, and high-fat dressings.  High-fat meats, such as hot dogs and fatty cuts of red and white meats, such as rib eye steak, sausage,  ham, and bacon.  High-fat dairy items, such as whole milk, butter, and cream cheese.  Eat small, frequent meals instead of large meals.  Avoid drinking large amounts of liquid with your meals.  Avoid eating meals during the 2-3 hours before bedtime.  Avoid lying down  right after you eat.  Do not exercise right after you eat. General Instructions  Pay attention to any changes in your symptoms.  Take over-the-counter and prescription medicines only as told by your health care provider. Do not take aspirin, ibuprofen, or other NSAIDs unless your health care provider told you to do so.  Do not use any tobacco products, including cigarettes, chewing tobacco, and e-cigarettes. If you need help quitting, ask your health care provider.  Wear loose-fitting clothing. Do not wear anything tight around your waist that causes pressure on your abdomen.  Raise (elevate) the head of your bed 6 inches (15cm).  Try to reduce your stress, such as with yoga or meditation. If you need help reducing stress, ask your health care provider.  If you are overweight, reduce your weight to an amount that is healthy for you. Ask your health care provider for guidance about a safe weight loss goal.  Keep all follow-up visits as told by your health care provider. This is important. SEEK MEDICAL CARE IF:  You have new symptoms.  You have unexplained weight loss.  You have difficulty swallowing, or it hurts to swallow.  You have wheezing or a persistent cough.  Your symptoms do not improve with treatment.  You have a hoarse voice. SEEK IMMEDIATE MEDICAL CARE IF:  You have pain in your arms, neck, jaw, teeth, or back.  You feel sweaty, dizzy, or light-headed.  You have chest pain or shortness of breath.  You vomit and your vomit looks like blood or coffee grounds.  You faint.  Your stool is bloody or black.  You cannot swallow, drink, or eat.   This information is not intended to replace advice given to you by your health care provider. Make sure you discuss any questions you have with your health care provider.   Document Released: 01/25/2005 Document Revised: 01/06/2015 Document Reviewed: 08/12/2014 Elsevier Interactive Patient Education [employer].

## 2015-10-12 ENCOUNTER — Encounter: Payer: Self-pay | Admitting: Family Medicine

## 2015-10-12 DIAGNOSIS — K219 Gastro-esophageal reflux disease without esophagitis: Secondary | ICD-10-CM | POA: Insufficient documentation

## 2015-10-19 ENCOUNTER — Ambulatory Visit (INDEPENDENT_AMBULATORY_CARE_PROVIDER_SITE_OTHER): Payer: BLUE CROSS/BLUE SHIELD | Admitting: Psychiatry

## 2015-10-19 ENCOUNTER — Encounter (HOSPITAL_COMMUNITY): Payer: Self-pay | Admitting: Psychiatry

## 2015-10-19 DIAGNOSIS — F3340 Major depressive disorder, recurrent, in remission, unspecified: Secondary | ICD-10-CM | POA: Diagnosis not present

## 2015-10-19 DIAGNOSIS — F900 Attention-deficit hyperactivity disorder, predominantly inattentive type: Secondary | ICD-10-CM

## 2015-10-19 MED ORDER — AMPHETAMINE-DEXTROAMPHET ER 20 MG PO CP24: 20.0000 mg | ORAL_CAPSULE | Freq: Every day | ORAL | Status: DC

## 2015-10-19 MED ORDER — AMPHETAMINE-DEXTROAMPHETAMINE 10 MG PO TABS: 10.0000 mg | ORAL_TABLET | Freq: Every day | ORAL | Status: DC

## 2015-10-19 NOTE — Progress Notes (Signed)
Patient ID: Cindy Jones, female   DOB: November 06, 1990, 25 y.o.   MRN: [id number] Patient ID: Cindy Jones, female   DOB: 05-May-1990, 25 y.o.   MRN: [id number]  [facility] [id number] Progress Note  Cindy Jones [id number] 25 y.o.  10/19/2015 8:55 AM  Chief Complaint: "good"  History of Present Illness: Pt got a new job and is very happy. She likes it a lot. States it is not as stressful and she is comfortable.   Pt is moving with her husband in July.   Pt is feeling overwhelmed. It is causing a lot of anxiety. States she thinks the anxiety is making her GERD worse. Pt is ready to try therapy to help deal with her anxiety.   States she feels stable emotionally but she has a little situational depression. Pt denies depression. Denies anhedonia, isolation, crying spells, low motivation, poor hygiene, worthlessness and hopelessness.   Sleep (8 hrs), appetite, energy are good. Pt is eating 3 meals a day and denies any symptoms of bulmia.  Adderall is working well and concentration is good. Notes if she doesn't take her second dose then she is more distracted and disconnected in the afternoons. Pt often forgets the second dose.  Denies irritability and depression. Denies GI upset and HA.   Tourettes is pretty much non-existant during the day except when the Adderall wears off. It centers around eye blinking (first symptom), chest tightening and flaring her nostrils. On days she is tired, upset and stressed she has more symptoms. Pt has been more stressed due to work and is noticing more eye blinking in the evenings. Pt went to the eye doctor and was told that her vision doesn't focus at the same time so she got glasses. Headaches have decreased.   Taking meds as prescribed and denies SE.   Suicidal Ideation: No Plan Formed: No Patient has means to carry out plan: No  Homicidal Ideation: No Plan Formed: No Patient has means to carry out plan: No  Review of  Systems: Psychiatric: Agitation: No Hallucination: No Depressed Mood: No Insomnia: No Hypersomnia: No Altered Concentration: No Feels Worthless: No Grandiose Ideas: No Belief In Special Powers: No New/Increased Substance Abuse: No Compulsions: No  Neurologic: Headache: No Seizure: No Paresthesias: No  Review of Systems  Constitutional: Negative for fever and chills.  HENT: Negative for congestion, ear pain, nosebleeds and sore throat.   Eyes: Negative for blurred vision, double vision and pain.  Respiratory: Negative for cough, sputum production and wheezing.   Cardiovascular: Negative for chest pain, palpitations and leg swelling.  Gastrointestinal: Positive for heartburn. Negative for nausea, vomiting and abdominal pain.  Genitourinary: Negative for dysuria, frequency, hematuria and flank pain.  Musculoskeletal: Negative for back pain, joint pain and neck pain.  Skin: Negative for itching and rash.  Neurological: Negative for dizziness, sensory change, seizures, loss of consciousness, weakness and headaches.  Psychiatric/Behavioral: Negative for depression, suicidal ideas, hallucinations and substance abuse. The patient is nervous/anxious. The patient does not have insomnia.      Past Medical, Family, Social History: lives alone in [place]. Works at [employer]. Raised in [place] then in [place] in HS. Father diagnosed with cancer when she was 5 and he passed away when she was 8yo. Mother became alcoholic after he passed. After he passed she went back and forth to her grandparents. Body image issues due to hyper focused step grandmother on weight. Family Members: mom and Twin sister. Close to paternal aunt and uncle.  reports that she has never smoked. She has never used smokeless tobacco. She reports that she drinks about 1.2 oz of alcohol per week. She reports that she does not use illicit drugs.  Family History  Problem Relation Age of Onset  . Alcohol abuse Mother    . Bipolar disorder Mother   . Cancer Father     sarcoma  . Tourette syndrome Maternal Aunt   . Alcohol abuse Maternal Aunt   . Hyperlipidemia Paternal Aunt   . Tourette syndrome Maternal Grandfather   . Heart disease Paternal Grandfather   . Alcohol abuse Maternal Uncle   . Suicidality Neg Hx     Past Medical History  Diagnosis Date  . Frequent headaches   . Migraines   . Urinary tract bacterial infections   . Tourette disorder   . Binge eating disorder   . Seasonal allergies   . PID (acute pelvic inflammatory disease)   . GERD (gastroesophageal reflux disease)      Outpatient Encounter Prescriptions as of 10/19/2015  Medication Sig  . amphetamine-dextroamphetamine (ADDERALL XR) 20 MG 24 hr capsule Take 1 capsule (20 mg total) by mouth daily.  [name]. amphetamine-dextroamphetamine (ADDERALL) 10 MG tablet Take 1 tablet (10 mg total) by mouth daily.  [name]. aspirin-acetaminophen-caffeine (EXCEDRIN MIGRAINE) 250-250-65 MG per tablet Take 2 tablets by mouth every 6 (six) hours as needed for headache.  . fluticasone (FLONASE) 50 MCG/ACT nasal spray Place 2 sprays into both nostrils daily.  [name]. loratadine (CLARITIN) 10 MG tablet Take 1 tablet (10 mg total) by mouth daily.  . Meth-Hyo-M Bl-Na Phos-Ph Sal (URIBEL) 118 MG CAPS   . omeprazole (PRILOSEC) 20 MG capsule Take 1 capsule (20 mg total) by mouth 2 (two) times daily before a meal.  . clotrimazole-betamethasone (LOTRISONE) cream Reported on 10/19/2015   No facility-administered encounter medications on file as of 10/19/2015.    Past Psychiatric History/Hospitalization(s): Anxiety: Yes Bipolar Disorder: No Depression: Yes Mania: No Psychosis: No Schizophrenia: No Personality Disorder: No Hospitalization for psychiatric illness: No History of Electroconvulsive Shock Therapy: No Prior Suicide Attempts: No  Physical Exam: Constitutional:  There were no vitals taken for this visit.  General Appearance: alert, oriented, no acute  distress and well nourished  Musculoskeletal: Strength & Muscle Tone: within normal limits Gait & Station: normal Patient leans: straight  Mental Status Examination/Evaluation: Objective: Attitude: Calm and cooperative  Appearance: Fairly Groomed, appears to be stated age  Eye Contact::  Good  Speech:  Clear and Coherent and Normal Rate  Volume:  Normal  Mood:  euthymic  Affect:  Full Range  Thought Process:  Goal Directed, Linear and Logical  Orientation:  Full (Time, Place, and Person)  Thought Content:  Negative  Suicidal Thoughts:  No  Homicidal Thoughts:  No  Judgement:  Fair  Insight:  Fair  Concentration: good  Memory: Immediate-good Recent-good Remote-good  Recall: fair  Language: fair  Gait and Station: normal  [employer] of Knowledge: average  Psychomotor Activity:  Normal  Akathisia:  No  Handed:  Right  AIMS (if indicated): n/a  Assets:  Manufacturing systems engineerCommunication Skills Desire for Improvement Financial Resources/Insurance Housing Intimacy Leisure Time Physical Health Resilience Social Support Veterinary surgeonTalents/Skills Transportation Vocational/Educational       Medical Decision Making (Choose Three): Established Problem, Stable/Improving (1), Review of Psycho-Social Stressors (1) and Review of Medication Regimen & Side Effects (2)  Assessment: AXIS I ADHD- inattentive type and Tourettes disorder. Hx of Bulemia; MDD-resolved  AXIS II Deferred   Treatment Plan/Recommendations:  Plan of Care: Medication management with supportive therapy. Risks/benefits and SE of the medication discussed. Pt verbalized understanding and verbal consent obtained for treatment. Affirm with the patient that the medications are taken as ordered. Patient expressed understanding of how their medications were to be used.      Laboratory: Reviewed labs with pt- 12/24/2013 WNL; EKG sinus arrhythmia, NSTEMI  Psychotherapy: Therapy: brief supportive therapy provided. Discussed  psychosocial stressors in detail.    Medications: Adderall XR  po qD and Adderall to  qLunch as needed for ADD. Pt wants to have kids in the next 3 yrs. Pt understands the medication can have teratogenic effects and will discuss with MD prior to trying to get pregnant.  May consider adding small dose of Zyprexa for Tourettes in the future if needed  Routine PRN Medications: No  Consultations: referred for individual therapy  Safety Concerns: Pt denies SI and is at an acute low risk for suicide.Patient told to call clinic if any problems occur. Patient advised to go to ER if they should develop SI/HI, side effects, or if symptoms worsen. Has crisis numbers to call if needed. Pt verbalized understanding.   Other: F/up in 3 months or sooner if needed    Oletta Darter, MD 10/19/2015

## 2015-11-03 ENCOUNTER — Ambulatory Visit (INDEPENDENT_AMBULATORY_CARE_PROVIDER_SITE_OTHER): Payer: BLUE CROSS/BLUE SHIELD | Admitting: Psychiatry

## 2015-11-03 DIAGNOSIS — F900 Attention-deficit hyperactivity disorder, predominantly inattentive type: Secondary | ICD-10-CM

## 2015-11-03 DIAGNOSIS — F502 Bulimia nervosa: Secondary | ICD-10-CM

## 2015-11-03 DIAGNOSIS — F952 Tourette's disorder: Secondary | ICD-10-CM

## 2015-11-11 ENCOUNTER — Ambulatory Visit (INDEPENDENT_AMBULATORY_CARE_PROVIDER_SITE_OTHER): Payer: BLUE CROSS/BLUE SHIELD | Admitting: Licensed Clinical Social Worker

## 2015-11-11 DIAGNOSIS — F900 Attention-deficit hyperactivity disorder, predominantly inattentive type: Secondary | ICD-10-CM

## 2015-11-11 DIAGNOSIS — F952 Tourette's disorder: Secondary | ICD-10-CM

## 2015-11-11 DIAGNOSIS — F502 Bulimia nervosa: Secondary | ICD-10-CM

## 2015-11-11 NOTE — Progress Notes (Signed)
   THERAPIST PROGRESS NOTE  Session Time: 4:15-5:05pm  Participation Level: Active  Behavioral Response: NeatAlert/Guarded  Type of Therapy: Individual Therapy  Treatment Goals addressed: Coping  Interventions: CBT  Summary: Cindy Jones is a 25 y.o. female who presents with anxiety, panic attacks, tourettes and ADD.  Suicidal/Homicidal: Nowithout intent/plan  Therapist Response: Pt presented guarded for initial appt. Pt described her anxiety and panic attacks in detail. Pt also described how her anxiety manifests into anger. Pt was raised in an alcoholic home. She has all the characteristics of ACOA: control issues, not feeling safe, not trusting. Went over the [employer] for ACOA. Gave pt info on ACOA to give to her husband so he can begin to understand her. Pt acknowledges her control issues. Explained to her about why she has the characteristic. Worked on activities for pt to begin to trust. Pt has some stressful situations upcoming: moving, husband graduating, husband getting citizenship. Worked with pt on breathing exercises, self-esteem activity, meditation. These activities were also assigned as homework.  Plan: Return again in 1 weeks.  Diagnosis: Axis I: ADD, Tourettes, Bulimia    Axis II:     Torii Royse S, Licensed Cli 11/11/2015

## 2015-11-18 ENCOUNTER — Ambulatory Visit (HOSPITAL_COMMUNITY): Payer: BLUE CROSS/BLUE SHIELD | Admitting: Licensed Clinical Social Worker

## 2015-11-29 ENCOUNTER — Ambulatory Visit (INDEPENDENT_AMBULATORY_CARE_PROVIDER_SITE_OTHER): Payer: BLUE CROSS/BLUE SHIELD | Admitting: Licensed Clinical Social Worker

## 2015-11-29 DIAGNOSIS — F900 Attention-deficit hyperactivity disorder, predominantly inattentive type: Secondary | ICD-10-CM

## 2015-11-30 ENCOUNTER — Other Ambulatory Visit: Payer: Self-pay | Admitting: Family Medicine

## 2015-11-30 NOTE — Progress Notes (Signed)
   THERAPIST PROGRESS NOTE  Session Time: 5:10-6:00pm  Participation Level: Active  Behavioral Response: CasualAlert  Type of Therapy: Individual Therapy  Treatment Goals addressed: Coping  Interventions: CBT and Supportive  Summary: Cindy Jones is a 25 y.o. female who presents with ADHD.   Suicidal/Homicidal: Nowithout intent/plan  Therapist Response: Pt participated in a discussion about how cognitions turn into actions. Pt has struggles in all parts of her life due to her outbursts, interruptions, and impulsive behavior. Role played with pt stop, listen, think and plan repeatedly. This is an old behavior so pt will have to practice impulse control techniques repeatedly. Discussed with pt how all her feelings turn into anger through the umbrella activity. Also worked with pt on who to keep in her inner circle and how to repair personal relationships in her inner circle that may have been damaged due to pts lack of impulse control. Discussed with pt traits of ACOA. Gave pt handouts on ACOA. Encouraged pt to go to [employer], practice impulse techniques.  Plan: Return again in 1 weeks.  Diagnosis: Axis I: F90.0    Axis II: No DX    Genae Strine S, LCAS-A 11/30/2015

## 2015-12-07 ENCOUNTER — Encounter: Payer: Self-pay | Admitting: Family Medicine

## 2015-12-07 ENCOUNTER — Ambulatory Visit (INDEPENDENT_AMBULATORY_CARE_PROVIDER_SITE_OTHER): Payer: BLUE CROSS/BLUE SHIELD | Admitting: Family Medicine

## 2015-12-07 VITALS — BP 123/86 | HR 83 | Temp 98.1°F | Resp 20 | Wt 175.0 lb

## 2015-12-07 DIAGNOSIS — R1013 Epigastric pain: Secondary | ICD-10-CM | POA: Diagnosis not present

## 2015-12-07 DIAGNOSIS — K219 Gastro-esophageal reflux disease without esophagitis: Secondary | ICD-10-CM

## 2015-12-07 NOTE — Progress Notes (Signed)
Patient ID: Cindy Jones, female   DOB: 04/12/1991, 25 y.o.   MRN: [id number]    Cindy Jones , [birth year], 25 y.o., female MRN: [id number]  CC: epigastric pain  Subjective:  F/U epigstric pain: Patient returns to follow on GERD. She had started the 20 mg omeprazole (decreased from 40 mg) last visit. She had been doing rather well, watching her diet closely and taking medications. She states this past weekend she ate some rocky road ice cream and then laid down and she had "severe" chest pain and felt like she could not breath. This lasted for a few hours and them she was "sore" midline chest/sternum after. She did not seek any treatment for that issue and she has not experienced the pain again. She does state she gets intermittent attacks of GERD that make her need to miss work. This is rare and less than once a month. She denies weight loss, fever, chills. She does endorse nausea and vomit on the one occasion Sunday with the chest pain late at night. She vomited a small amount of the ice cream she consumed at that time. She states she now feels full quickly as well. Eating very small meals (1/3 chicken breast) before feeling full. Pt has a history of bulmia, which she states as been controlled for years.    Prior Note:  pt returns today for follow up to her epigastric pain. She states she was doing rather well on the PPI  And had complete resolution of symptoms. She has been following a GERD diet. She no longer eats within a 3 hour window of laying down. She does notice heavy or large meals can reproduce symptoms.  She  identified grapes, lemon water and spicy foods  As triggers. She endorses a "sour" stomach when her diet slips. She states last week her reflux worsened. She had recently reduced her PPI to intermittent use. She has also been using Excedrin for her headaches on a more regular basis.  H.Pylori negative a few months ago. Pt has been a bulimic in the past, denies purging for years.    Allergies  Allergen Reactions  . Latex Swelling and Rash  . Naproxen   . Sulfa Antibiotics Hives and Swelling   Social History  Substance Use Topics  . Smoking status: Never Smoker  . Smokeless tobacco: Never Used  . Alcohol use 1.2 oz/week    2 Glasses of wine per week   Past Medical History:  Diagnosis Date  . Binge eating disorder   . Frequent headaches   . GERD (gastroesophageal reflux disease)   . Migraines   . PID (acute pelvic inflammatory disease)   . Seasonal allergies   . Tourette disorder   . Urinary tract bacterial infections    Past Surgical History:  Procedure Laterality Date  . TONSILLECTOMY AND ADENOIDECTOMY  2013   Family History  Problem Relation Age of Onset  . Alcohol abuse Mother   . Bipolar disorder Mother   . Cancer Father     sarcoma  . Tourette syndrome Maternal Aunt   . Alcohol abuse Maternal Aunt   . Hyperlipidemia Paternal Aunt   . Tourette syndrome Maternal Grandfather   . Heart disease Paternal Grandfather   . Alcohol abuse Maternal Uncle   . Suicidality Neg Hx      Medication List       Accurate as of 12/07/15  3:35 PM. Always use your most recent med list.  amphetamine-dextroamphetamine 10 MG tablet Commonly known as:  ADDERALL Take 1 tablet (10 mg total) by mouth daily.   amphetamine-dextroamphetamine 20 MG 24 hr capsule Commonly known as:  ADDERALL XR Take 1 capsule (20 mg total) by mouth daily.   aspirin-acetaminophen-caffeine 250-250-65 MG tablet Commonly known as:  EXCEDRIN MIGRAINE Take 2 tablets by mouth every 6 (six) hours as needed for headache.   clotrimazole-betamethasone cream Commonly known as:  LOTRISONE Reported on 10/19/2015   fluticasone 50 MCG/ACT nasal spray Commonly known as:  FLONASE Place 2 sprays into both nostrils daily.   loratadine 10 MG tablet Commonly known as:  CLARITIN Take 1 tablet (10 mg total) by mouth daily.   omeprazole 20 MG capsule Commonly known as:   PRILOSEC Take 1 capsule (20 mg total) by mouth 2 (two) times daily before a meal.   URIBEL 118 MG Caps       ROS: Negative, with the exception of above mentioned in HPI  Objective:  BP 123/86 (BP Location: Right Arm, Patient Position: Sitting, Cuff Size: Large)   Pulse 83   Temp 98.1 F (36.7 C)   Resp 20   Wt 175 lb (79.4 kg)   SpO2 100%   BMI 28.68 kg/m  Body mass index is 28.68 kg/m. Gen: Afebrile. No acute distress. Nontoxic in appearance, well developed well nourished female.  HENT: AT. [place].  MMM, no oral lesions.  Eyes:Pupils Equal Round Reactive to light, Extraocular movements intact,  Conjunctiva without redness, discharge or icterus. Neck/lymp/endocrine: Supple,no lymphadenopathy CV: RRR  Chest: CTAB, no wheeze or crackles. No chest tenderness.  Abd: Soft. Round. ND. TTP just below sternum.  BS present. No Masses palpated.    Assessment/Plan: Cindy Jones is a 25 y.o. female present for acute OV for  Gastroesophageal reflux disease, esophagitis presence not specified - discussed with pt reflux, gastritis, hiatal hernia, etc. She has been doing well, with intermittent "flare" of pain. Her discomfort this weekend sounds more like a sliding hiatal hernia. Considering she was/is bulimic, I favor referral to GI at this time and pt is agreeable.  - GERD diet  - Continue omeprazole 20 mg  - AVOID NSAIDS; including Excedrin  - GI referral  > 25 minutes spent with patient, >50% of time spent face to face counseling patient and coordinating care.    electronically signed by:  Felix Pacinienee Shritha Bresee, DO  [facility] [place]

## 2015-12-07 NOTE — Patient Instructions (Signed)
Continue omeprazole 20 mg daily. If symptoms worsen you can increase to 40 mg daily, but you have to let us known. Continue GERD diet.  Try medication with dinner.  I will refer you to GI, and they will call to schedule you.  Not saying you have a hernia, but the symptoms you described around like it is possible.     Hiatal Hernia A hiatal hernia occurs when part of your stomach slides above the muscle that separates your abdomen from your chest (diaphragm). You can be born with a hiatal hernia (congenital), or it may develop over time. In almost all cases of hiatal hernia, only the top part of the stomach pushes through.  Many people have a hiatal hernia with no symptoms. The larger the hernia, the more likely that you will have symptoms. In some cases, a hiatal hernia allows stomach acid to flow back into the tube that carries food from your mouth to your stomach (esophagus). This may cause heartburn symptoms. Severe heartburn symptoms may mean you have developed a condition called gastroesophageal reflux disease (GERD).  CAUSES  Hiatal hernias are caused by a weakness in the opening (hiatus) where your esophagus passes through your diaphragm to attach to the upper part of your stomach. You may be born with a weakness in your hiatus, or a weakness can develop. RISK FACTORS Older age is a major risk factor for a hiatal hernia. Anything that increases pressure on your diaphragm can also increase your risk of a hiatal hernia. This includes:  Pregnancy.  Excess weight.  Frequent constipation. SIGNS AND SYMPTOMS  People with a hiatal hernia often have no symptoms. If symptoms develop, they are almost always caused by GERD. They may include:  Heartburn.  Belching.  Indigestion.  Trouble swallowing.  Coughing or wheezing.  Sore throat.  Hoarseness.  Chest pain. DIAGNOSIS  A hiatal hernia is sometimes found during an exam for another problem. Your health care provider may suspect a  hiatal hernia if you have symptoms of GERD. Tests may be done to diagnose GERD. These may include:  X-rays of your stomach or chest.  An upper gastrointestinal (GI) series. This is an X-ray exam of your GI tract involving the use of a chalky liquid that you swallow. The liquid shows up clearly on the X-ray.  Endoscopy. This is a procedure to look into your stomach using a thin, flexible tube that has a tiny camera and light on the end of it. TREATMENT  If you have no symptoms, you may not need treatment. If you have symptoms, treatment may include:  Dietary and lifestyle changes to help reduce GERD symptoms.  Medicines. These may include:  Over-the-counter antacids.  Medicines that make your stomach empty more quickly.  Medicines that block the production of stomach acid (H2 blockers).  Stronger medicines to reduce stomach acid (proton pump inhibitors).  You may need surgery to repair the hernia if other treatments are not helping. HOME CARE INSTRUCTIONS   Take all medicines as directed by your health care provider.  Quit smoking, if you smoke.  Try to achieve and maintain a healthy body weight.  Eat frequent small meals instead of three large meals a day. This keeps your stomach from getting too full.  Eat slowly.  Do not lie down right after eating.  Do noteat 1-2 hours before bed.   Do not drink beverages with caffeine. These include cola, coffee, cocoa, and tea.  Do not drink alcohol.  Avoid foods that  can make symptoms of GERD worse. These may include:  Fatty foods.  Citrus fruits.  Other foods and drinks that contain acid.  Avoid putting pressure on your belly. Anything that puts pressure on your belly increases the amount of acid that may be pushed up into your esophagus.   Avoid bending over, especially after eating.  Raise the head of your bed by putting blocks under the legs. This keeps your head and esophagus higher than your stomach.  Do not  wear tight clothing around your chest or stomach.  Try not to strain when having a bowel movement, when urinating, or when lifting heavy objects. SEEK MEDICAL CARE IF:  Your symptoms are not controlled with medicines or lifestyle changes.  You are having trouble swallowing.  You have coughing or wheezing that will not go away. SEEK IMMEDIATE MEDICAL CARE IF:  Your pain is getting worse.  Your pain spreads to your arms, neck, jaw, teeth, or back.  You have shortness of breath.  You sweat for no reason.  You feel sick to your stomach (nauseous) or vomit.  You vomit blood.  You have bright red blood in your stools.  You have black, tarry stools.    This information is not intended to replace advice given to you by your health care provider. Make sure you discuss any questions you have with your health care provider.   Document Released: 07/08/2003 Document Revised: 05/08/2014 Document Reviewed: 04/04/2013 Elsevier Interactive Patient Education [employer]

## 2015-12-08 ENCOUNTER — Encounter: Payer: Self-pay | Admitting: Internal Medicine

## 2015-12-08 ENCOUNTER — Encounter (HOSPITAL_COMMUNITY): Payer: Self-pay | Admitting: Licensed Clinical Social Worker

## 2015-12-08 ENCOUNTER — Ambulatory Visit (INDEPENDENT_AMBULATORY_CARE_PROVIDER_SITE_OTHER): Payer: BLUE CROSS/BLUE SHIELD | Admitting: Licensed Clinical Social Worker

## 2015-12-08 DIAGNOSIS — F900 Attention-deficit hyperactivity disorder, predominantly inattentive type: Secondary | ICD-10-CM | POA: Diagnosis not present

## 2015-12-08 DIAGNOSIS — F952 Tourette's disorder: Secondary | ICD-10-CM | POA: Diagnosis not present

## 2015-12-08 NOTE — Progress Notes (Signed)
THERAPIST PROGRESS NOTE  Session Time: 5:10-6:00pm  Participation Level: Active  Behavioral Response: CasualAlert  Type of Therapy: Individual Therapy  Treatment Goals addressed: Coping  Interventions: CBT and Supportive  Summary: Cindy Jones is a 25 y.o. female who presents with ADHD.   Suicidal/Homicidal: Nowithout intent/plan  Therapist Response:  Processed with pt the skills discussed in last session: how cognitions turn into actions. She said she worked on it some but it's difficult since its old behaviors. Pt also spoke with husband about a handout on traits of  ACOA. She reports it was a good discussion and husband now understands her better. Worked with pt on her communciation style and role played with her how to talk with her husband using I statements. Also worked with pt on problem solving techniques.  Pt struggles in all parts of her life due to her outbursts, interruptions, and impulsive behavior. Role played with pt stop, listen, think and plan repeatedly again this week.Pt reports she has minimially used the breathing exercises and the meditation. Encouraged pt to go to [employer] meetings, practice impulse techniques.  Plan: Return again in 1 weeks.  Diagnosis:                Axis I: F90.0            Vernona RiegerLisbeth S. [name], LCAS-A

## 2015-12-13 ENCOUNTER — Ambulatory Visit (HOSPITAL_COMMUNITY): Payer: Self-pay | Admitting: Licensed Clinical Social Worker

## 2015-12-15 ENCOUNTER — Telehealth (HOSPITAL_COMMUNITY): Payer: Self-pay | Admitting: Licensed Clinical Social Worker

## 2015-12-15 ENCOUNTER — Ambulatory Visit (HOSPITAL_COMMUNITY): Payer: BLUE CROSS/BLUE SHIELD | Admitting: Licensed Clinical Social Worker

## 2015-12-16 ENCOUNTER — Ambulatory Visit (HOSPITAL_COMMUNITY): Payer: Self-pay | Admitting: Licensed Clinical Social Worker

## 2015-12-16 ENCOUNTER — Ambulatory Visit (HOSPITAL_COMMUNITY): Payer: BLUE CROSS/BLUE SHIELD | Admitting: Licensed Clinical Social Worker

## 2015-12-20 ENCOUNTER — Ambulatory Visit (INDEPENDENT_AMBULATORY_CARE_PROVIDER_SITE_OTHER): Payer: BLUE CROSS/BLUE SHIELD | Admitting: Licensed Clinical Social Worker

## 2015-12-20 ENCOUNTER — Ambulatory Visit (HOSPITAL_COMMUNITY): Payer: Self-pay | Admitting: Licensed Clinical Social Worker

## 2015-12-20 DIAGNOSIS — F900 Attention-deficit hyperactivity disorder, predominantly inattentive type: Secondary | ICD-10-CM

## 2015-12-20 DIAGNOSIS — F952 Tourette's disorder: Secondary | ICD-10-CM | POA: Diagnosis not present

## 2015-12-21 ENCOUNTER — Encounter (HOSPITAL_COMMUNITY): Payer: Self-pay | Admitting: Licensed Clinical Social Worker

## 2015-12-21 ENCOUNTER — Ambulatory Visit: Payer: Self-pay | Admitting: Family Medicine

## 2015-12-21 NOTE — Progress Notes (Signed)
Daily Group Progress Note  Program: Outpatient   Group Time: 5:15-6:30pm  Participation Level: Active  Behavioral Response: Appropriate  Type of Therapy:  Psychoeducation/Group process  Summary of Progress: Today was pt's first day in the OP group. She was sick last week and unable to attend. Pt introduced herself to the group sharing background information. Pt participated in a discussion on the use of boundaries with family members and relationships. Pt was encouraged to remain in the present, and use non-permeable boundaries for protection against words.   Vernona RiegerLisbeth S Lavette Yankovich, LCAS-A

## 2015-12-22 ENCOUNTER — Ambulatory Visit (INDEPENDENT_AMBULATORY_CARE_PROVIDER_SITE_OTHER): Payer: BLUE CROSS/BLUE SHIELD | Admitting: Licensed Clinical Social Worker

## 2015-12-22 DIAGNOSIS — F900 Attention-deficit hyperactivity disorder, predominantly inattentive type: Secondary | ICD-10-CM | POA: Diagnosis not present

## 2015-12-23 NOTE — Progress Notes (Signed)
   Daily Group Progress Note  Program: Outpatient   Group Time: 5:15-6:30pm  Participation Level: Active  Behavioral Response: Appropriate  Type of Therapy:  Psychoeducation/Group process  Summary of Progress: Patient presented on time and was engaged in discussion. Patient shared struggles with her relationships and her instinct to try to control situations. Patient interacted appropriately in the group and accepted feedback.  Patient denies SI/HI or any current immediate needs.    Cindy GuilesJenny Pandora Mccrackin, MSW, LCSW, LCAS

## 2015-12-27 ENCOUNTER — Ambulatory Visit (INDEPENDENT_AMBULATORY_CARE_PROVIDER_SITE_OTHER): Payer: BLUE CROSS/BLUE SHIELD | Admitting: Licensed Clinical Social Worker

## 2015-12-27 DIAGNOSIS — F952 Tourette's disorder: Secondary | ICD-10-CM

## 2015-12-27 DIAGNOSIS — F900 Attention-deficit hyperactivity disorder, predominantly inattentive type: Secondary | ICD-10-CM

## 2015-12-28 ENCOUNTER — Encounter (HOSPITAL_COMMUNITY): Payer: Self-pay | Admitting: Licensed Clinical Social Worker

## 2015-12-28 ENCOUNTER — Other Ambulatory Visit (HOSPITAL_COMMUNITY): Payer: Self-pay

## 2015-12-28 DIAGNOSIS — F900 Attention-deficit hyperactivity disorder, predominantly inattentive type: Secondary | ICD-10-CM

## 2015-12-28 MED ORDER — AMPHETAMINE-DEXTROAMPHET ER 20 MG PO CP24: 20.0000 mg | ORAL_CAPSULE | Freq: Every day | ORAL | 0 refills | Status: DC

## 2015-12-28 NOTE — Progress Notes (Signed)
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 It was suggested to pt that she must first like herself before she will be able to bring someone else into her life. Pt was open during the discussion and suggestions.     [name]. [name], LCAS-A 

## 2015-12-29 ENCOUNTER — Ambulatory Visit (INDEPENDENT_AMBULATORY_CARE_PROVIDER_SITE_OTHER): Payer: BLUE CROSS/BLUE SHIELD | Admitting: Licensed Clinical Social Worker

## 2015-12-29 ENCOUNTER — Telehealth (HOSPITAL_COMMUNITY): Payer: Self-pay

## 2015-12-29 DIAGNOSIS — F952 Tourette's disorder: Secondary | ICD-10-CM | POA: Diagnosis not present

## 2015-12-29 DIAGNOSIS — F900 Attention-deficit hyperactivity disorder, predominantly inattentive type: Secondary | ICD-10-CM | POA: Diagnosis not present

## 2015-12-29 NOTE — Telephone Encounter (Signed)
[place], husband picked up prescription on 1/61/098/30/17 lic [id number]  dlo

## 2015-12-30 ENCOUNTER — Encounter (HOSPITAL_COMMUNITY): Payer: Self-pay | Admitting: Licensed Clinical Social Worker

## 2015-12-30 NOTE — Progress Notes (Signed)
  Daily Group Progress Note  Program: Outpatient  Group Time: 5:15-6:30  Participation Level: Active  Behavioral Response: Appropriate  Type of Therapy:  Psychoeducation/Group Therapy  Summary of Progress:   Pt participated in a discussion: "Being strong, when you don't feel like being strong." Pt appeared open in the discussion about expectations from others. It was suggested to pt to use her self-awareness and self-care to be herself. Pt was receptive to the intervention.   Nickie Deren S. Jayden Kratochvil, LCAS-A   

## 2015-12-31 ENCOUNTER — Ambulatory Visit: Payer: Self-pay | Admitting: Family Medicine

## 2016-01-03 ENCOUNTER — Ambulatory Visit (HOSPITAL_COMMUNITY): Payer: Self-pay | Admitting: Licensed Clinical Social Worker

## 2016-01-05 ENCOUNTER — Ambulatory Visit (INDEPENDENT_AMBULATORY_CARE_PROVIDER_SITE_OTHER): Payer: BLUE CROSS/BLUE SHIELD | Admitting: Licensed Clinical Social Worker

## 2016-01-05 DIAGNOSIS — F900 Attention-deficit hyperactivity disorder, predominantly inattentive type: Secondary | ICD-10-CM | POA: Diagnosis not present

## 2016-01-06 NOTE — Progress Notes (Signed)
   Daily Group Progress Note  Program: Outpatient   Group Time: 5:15-6:45pm  Participation Level: Active  Behavioral Response: Appropriate  Type of Therapy:  Psychoeducation/Group process  Summary of Progress: Patient presented on time and was engaged in discussion. Patient shared struggles with her boss and setting boundaries as well as current legislation which is effecting her life. Patient interacted appropriately in the group and accepted feedback. Patient demonstrated progress as evidenced by stating she is reading a book recommended by her therapist and increased awareness about boundaries. Patient denies SI/HI or any current immediate needs.    Cindy GuilesJenny Izola Teague, MSW, LCSW, LCAS

## 2016-01-10 ENCOUNTER — Telehealth (HOSPITAL_COMMUNITY): Payer: Self-pay | Admitting: Licensed Clinical Social Worker

## 2016-01-10 ENCOUNTER — Ambulatory Visit (HOSPITAL_COMMUNITY): Payer: Self-pay | Admitting: Licensed Clinical Social Worker

## 2016-01-12 ENCOUNTER — Ambulatory Visit (INDEPENDENT_AMBULATORY_CARE_PROVIDER_SITE_OTHER): Payer: BLUE CROSS/BLUE SHIELD | Admitting: Licensed Clinical Social Worker

## 2016-01-12 DIAGNOSIS — F900 Attention-deficit hyperactivity disorder, predominantly inattentive type: Secondary | ICD-10-CM | POA: Diagnosis not present

## 2016-01-12 DIAGNOSIS — F952 Tourette's disorder: Secondary | ICD-10-CM | POA: Diagnosis not present

## 2016-01-12 DIAGNOSIS — Z23 Encounter for immunization: Secondary | ICD-10-CM | POA: Diagnosis not present

## 2016-01-13 ENCOUNTER — Encounter (HOSPITAL_COMMUNITY): Payer: Self-pay | Admitting: Licensed Clinical Social Worker

## 2016-01-13 ENCOUNTER — Telehealth (HOSPITAL_COMMUNITY): Payer: Self-pay | Admitting: Licensed Clinical Social Worker

## 2016-01-13 NOTE — Progress Notes (Signed)
Daily Group Progress Note  Program: Outpatient   Group Time: 5:15-7:00pm  Participation Level: Active  Behavioral Response: Appropriate  Type of Therapy:  Psychoeducation/Group Therapy  Summary of Progress:   Pt participated in a discussion on anxiety triggers. Pt identified her personal anxiety triggers. Pt was encouraged to use her coping skills to prevent the anxiety from spiraling out of control. Pt shared she gets anxious from too much stimulation and will sit and breath before he anxiety escalates.   Vernona RiegerLisbeth S. Kateena Degroote, LCAS-A

## 2016-01-17 ENCOUNTER — Ambulatory Visit (INDEPENDENT_AMBULATORY_CARE_PROVIDER_SITE_OTHER): Payer: BLUE CROSS/BLUE SHIELD | Admitting: Licensed Clinical Social Worker

## 2016-01-17 DIAGNOSIS — F952 Tourette's disorder: Secondary | ICD-10-CM | POA: Diagnosis not present

## 2016-01-17 DIAGNOSIS — F900 Attention-deficit hyperactivity disorder, predominantly inattentive type: Secondary | ICD-10-CM

## 2016-01-18 ENCOUNTER — Encounter (HOSPITAL_COMMUNITY): Payer: Self-pay | Admitting: Licensed Clinical Social Worker

## 2016-01-18 NOTE — Progress Notes (Signed)
Daily Group Progress Note  Program: Outpatient Group   Group Time: 5:15-6:55pm  Participation Level: Active  Behavioral Response: Appropriate  Type of Therapy:  Psychoeducation/Group Therapy  Summary of Progress:   Pt participated in a discussion on relationships in recovery; mental illness can destroy relationships or enhance them. Pt was encouraged to put herself and her recovery first before beginning a new relationship. Pt was engaged and receptive to the group interventions.   Cindy Jones S. Dody Smartt, LCAS-A 

## 2016-01-19 ENCOUNTER — Ambulatory Visit (INDEPENDENT_AMBULATORY_CARE_PROVIDER_SITE_OTHER): Payer: BLUE CROSS/BLUE SHIELD | Admitting: Licensed Clinical Social Worker

## 2016-01-19 DIAGNOSIS — F900 Attention-deficit hyperactivity disorder, predominantly inattentive type: Secondary | ICD-10-CM | POA: Diagnosis not present

## 2016-01-19 DIAGNOSIS — F952 Tourette's disorder: Secondary | ICD-10-CM | POA: Diagnosis not present

## 2016-01-20 ENCOUNTER — Encounter (HOSPITAL_COMMUNITY): Payer: Self-pay | Admitting: Psychiatry

## 2016-01-20 ENCOUNTER — Ambulatory Visit (INDEPENDENT_AMBULATORY_CARE_PROVIDER_SITE_OTHER): Payer: BLUE CROSS/BLUE SHIELD | Admitting: Psychiatry

## 2016-01-20 ENCOUNTER — Encounter (HOSPITAL_COMMUNITY): Payer: Self-pay | Admitting: Licensed Clinical Social Worker

## 2016-01-20 VITALS — BP 122/68 | HR 74 | Ht 65.5 in | Wt 170.6 lb

## 2016-01-20 DIAGNOSIS — F952 Tourette's disorder: Secondary | ICD-10-CM | POA: Diagnosis not present

## 2016-01-20 DIAGNOSIS — F900 Attention-deficit hyperactivity disorder, predominantly inattentive type: Secondary | ICD-10-CM

## 2016-01-20 DIAGNOSIS — F502 Bulimia nervosa: Secondary | ICD-10-CM | POA: Diagnosis not present

## 2016-01-20 DIAGNOSIS — F3341 Major depressive disorder, recurrent, in partial remission: Secondary | ICD-10-CM | POA: Diagnosis not present

## 2016-01-20 MED ORDER — AMPHETAMINE-DEXTROAMPHETAMINE 10 MG PO TABS: 10.0000 mg | ORAL_TABLET | Freq: Every day | ORAL | 0 refills | Status: DC

## 2016-01-20 MED ORDER — AMPHETAMINE-DEXTROAMPHET ER 25 MG PO CP24: 25.0000 mg | ORAL_CAPSULE | Freq: Every day | ORAL | 0 refills | Status: DC

## 2016-01-20 NOTE — Progress Notes (Signed)
Patient ID: Cindy Jones, female   DOB: 1990-11-28, 25 y.o.   MRN: [id number] Patient ID: Cindy Jones, female   DOB: June 03, 1990, 25 y.o.   MRN: [id number]  [facility] [id number] Progress Note  Cindy Jones [id number] 25 y.o.  01/20/2016 9:13 AM  Chief Complaint: "good"  History of Present Illness: Pt is having a lot of acid reflux and is planning on following up with GI next week.   Pt is happy with her new job.. She likes it a lot. States it is not as stressful and she is comfortable.   Pt's anxiety is better until she makes a mistake. When she makes a mistake anxiety flares and Tourette's comes on. Pt is ready to try therapy to help deal with her anxiety. Denies panic attacks and husband has even commented that her anxiety is improved.   States she feels stable emotionally but she has a little situational depression. Pt denies depression. Denies anhedonia, isolation, crying spells, low motivation, poor hygiene, worthlessness and hopelessness. Group therapy twice a week helps a lot.   Sleep (8 hrs), appetite, energy are good. Pt is eating 3 meals a day and denies any symptoms of bulmia.  Adderall is working well and concentration is good. Pt notes at noon she often makes a lot of mistakes. Notes if she doesn't take her second dose then she is more distracted and disconnected in the afternoons. Pt often forgets the second dose.  Denies irritability and depression. Denies GI upset and HA.   Tourettes is pretty much non-existant during the day except when the Adderall wears off. It centers around eye blinking (first symptom), chest tightening and flaring her nostrils. On days she is tired, upset and stressed she has more symptoms.  Pt went to the eye doctor and was told that her vision doesn't focus at the same time so she got glasses and it helps. Headaches have decreased.   Taking meds as prescribed and denies SE.   Suicidal Ideation: No Plan Formed: No Patient has means to  carry out plan: No  Homicidal Ideation: No Plan Formed: No Patient has means to carry out plan: No  Review of Systems: Psychiatric: Agitation: No Hallucination: No Depressed Mood: No Insomnia: No Hypersomnia: No Altered Concentration: No Feels Worthless: No Grandiose Ideas: No Belief In Special Powers: No New/Increased Substance Abuse: No Compulsions: No  Neurologic: Headache: No Seizure: No Paresthesias: No  Review of Systems  Constitutional: Negative for chills and fever.  HENT: Negative for congestion, ear pain, nosebleeds and sore throat.   Eyes: Negative for blurred vision, double vision and pain.  Respiratory: Negative for cough, sputum production and wheezing.   Cardiovascular: Negative for chest pain, palpitations and leg swelling.  Gastrointestinal: Positive for heartburn. Negative for abdominal pain, nausea and vomiting.  Genitourinary: Negative for dysuria, flank pain, frequency and hematuria.  Musculoskeletal: Negative for back pain, joint pain and neck pain.  Skin: Negative for itching and rash.  Neurological: Negative for dizziness, sensory change, seizures, loss of consciousness, weakness and headaches.  Psychiatric/Behavioral: Negative for depression, hallucinations, substance abuse and suicidal ideas. The patient is nervous/anxious. The patient does not have insomnia.      Past Medical, Family, Social History: lives alone in [place]. Works at [employer]. Raised in [place] then in [place] in [place]. Father diagnosed with cancer when she was 5 and he passed away when she was 8yo. Mother became alcoholic after he passed. After he passed she went back and forth  to her grandparents. Body image issues due to hyper focused step grandmother on weight. Family Members: mom and Twin sister. Close to paternal aunt and uncle.  reports that she has never smoked. She has never used smokeless tobacco. She reports that she drinks about 1.2 oz of alcohol per week . She reports  that she does not use drugs.  Family History  Problem Relation Age of Onset  . Alcohol abuse Mother   . Bipolar disorder Mother   . Cancer Father     sarcoma  . Tourette syndrome Maternal Aunt   . Alcohol abuse Maternal Aunt   . Hyperlipidemia Paternal Aunt   . Tourette syndrome Maternal Grandfather   . Heart disease Paternal Grandfather   . Alcohol abuse Maternal Uncle   . Suicidality Neg Hx     Past Medical History:  Diagnosis Date  . Binge eating disorder   . Frequent headaches   . GERD (gastroesophageal reflux disease)   . Migraines   . PID (acute pelvic inflammatory disease)   . Seasonal allergies   . Tourette disorder   . Urinary tract bacterial infections      Outpatient Encounter Prescriptions as of 01/20/2016  Medication Sig  . amphetamine-dextroamphetamine (ADDERALL XR) 20 MG 24 hr capsule Take 1 capsule (20 mg total) by mouth daily.  [name]. amphetamine-dextroamphetamine (ADDERALL) 10 MG tablet Take 1 tablet (10 mg total) by mouth daily.  [name]. aspirin-acetaminophen-caffeine (EXCEDRIN MIGRAINE) 250-250-65 MG per tablet Take 2 tablets by mouth every 6 (six) hours as needed for headache.  . clotrimazole-betamethasone (LOTRISONE) cream Reported on 10/19/2015  . fluticasone (FLONASE) 50 MCG/ACT nasal spray Place 2 sprays into both nostrils daily.  [name]. loratadine (CLARITIN) 10 MG tablet Take 1 tablet (10 mg total) by mouth daily.  [name]. omeprazole (PRILOSEC) 20 MG capsule Take 1 capsule (20 mg total) by mouth 2 (two) times daily before a meal.  . Meth-Hyo-M Bl-Na Phos-Ph Sal (URIBEL) 118 MG CAPS    No facility-administered encounter medications on file as of 01/20/2016.     Past Psychiatric History/Hospitalization(s): Anxiety: Yes Bipolar Disorder: No Depression: Yes Mania: No Psychosis: No Schizophrenia: No Personality Disorder: No Hospitalization for psychiatric illness: No History of Electroconvulsive Shock Therapy: No Prior Suicide Attempts: No  Physical  Exam: Constitutional:  BP 122/68   Pulse 74   Ht 5' 5.5" (1.664 m)   Wt 170 lb 9.6 oz (77.4 kg)   BMI 27.96 kg/m   General Appearance: alert, oriented, no acute distress and well nourished  Musculoskeletal: Strength & Muscle Tone: within normal limits Gait & Station: normal Patient leans: straight  Mental Status Examination/Evaluation: Objective: Attitude: Calm and cooperative  Appearance: Fairly Groomed, appears to be stated age  Eye Contact::  Good  Speech:  Clear and Coherent and Normal Rate  Volume:  Normal  Mood:  euthymic  Affect:  Full Range  Thought Process:  Goal Directed, Linear and Logical  Orientation:  Full (Time, Place, and Person)  Thought Content:  Negative  Suicidal Thoughts:  No  Homicidal Thoughts:  No  Judgement:  Fair  Insight:  Fair  Concentration: good  Memory: Immediate-good Recent-good Remote-good  Recall: fair  Language: fair  Gait and Station: normal  [employer] of Knowledge: average  Psychomotor Activity:  Normal  Akathisia:  No  Handed:  Right  AIMS (if indicated): n/a  Assets:  Communication Skills Desire for Improvement Financial Resources/Insurance Housing Intimacy Leisure Time Physical Health Resilience Social Support Energy managerTalents/Skills Transportation Vocational/Educational  Assessment: AXIS I ADHD- inattentive type and Tourettes disorder. Hx of Bulemia; MDD-resolved  AXIS II Deferred   Treatment Plan/Recommendations:  Plan of Care: Medication management with supportive therapy. Risks/benefits and SE of the medication discussed. Pt verbalized understanding and verbal consent obtained for treatment. Affirm with the patient that the medications are taken as ordered. Patient expressed understanding of how their medications were to be used.      Laboratory: Reviewed labs with pt- 12/24/2013 WNL; EKG sinus arrhythmia, NSTEMI  Psychotherapy: Therapy: brief supportive therapy provided. Discussed psychosocial  stressors in detail.    Medications: increase Adderall XR 25mg  po qD and Adderall to 10mg  qLunch as needed for ADD. Pt wants to have kids in the next 3 yrs. Pt understands the medication can have teratogenic effects and will discuss with MD prior to trying to get pregnant.  May consider adding small dose of Zyprexa for Tourettes in the future if needed  Routine PRN Medications: No  Consultations: referred for individual therapy  Safety Concerns: Pt denies SI and is at an acute low risk for suicide.Patient told to call clinic if any problems occur. Patient advised to go to ER if they should develop SI/HI, side effects, or if symptoms worsen. Has crisis numbers to call if needed. Pt verbalized understanding.   Other: F/up in 3 months or sooner if needed    Oletta Darter, MD 01/20/2016

## 2016-01-20 NOTE — Progress Notes (Signed)
Daily Group Progress Note  Program: Outpatient Group   Group Time: 5:15-6:50pm  Participation Level: Active  Behavioral Response: Appropriate  Type of Therapy:  Psychoeducation/Group Therapy  Summary of Progress:   Pt participated in a discussion on family roles in a dysfunctional family. Pt was brought up in an alcoholic family.  In order to try to maintain homeostasis, each family member took on roles. Her family role was the hero. Her father died when she was 5 and her mother was an alcoholic. Pt took care of her mother and her sister. PT took care of herself as a child and monitored herself. Because of her family of origin pt experiences depression and anxiety, which she is working on in therapy.  Pt now understands why she wants control in all areas of her life and how she feels she is a survivor from her childhood. Pt was receptive to the intervention and suggestions during group process.   Vernona RiegerLisbeth S. Lamiyah Schlotter, LCAS-A

## 2016-01-23 ENCOUNTER — Encounter (HOSPITAL_COMMUNITY): Payer: Self-pay | Admitting: Emergency Medicine

## 2016-01-23 ENCOUNTER — Emergency Department (HOSPITAL_COMMUNITY): Payer: BLUE CROSS/BLUE SHIELD

## 2016-01-23 ENCOUNTER — Emergency Department (HOSPITAL_COMMUNITY)
Admission: EM | Admit: 2016-01-23 | Discharge: 2016-01-23 | Disposition: A | Discharge status: Home / Self Care | Payer: BLUE CROSS/BLUE SHIELD | Attending: Emergency Medicine | Admitting: Emergency Medicine

## 2016-01-23 DIAGNOSIS — R1013 Epigastric pain: Secondary | ICD-10-CM | POA: Diagnosis present

## 2016-01-23 DIAGNOSIS — K8020 Calculus of gallbladder without cholecystitis without obstruction: Secondary | ICD-10-CM | POA: Diagnosis not present

## 2016-01-23 DIAGNOSIS — F909 Attention-deficit hyperactivity disorder, unspecified type: Secondary | ICD-10-CM | POA: Insufficient documentation

## 2016-01-23 DIAGNOSIS — Z7982 Long term (current) use of aspirin: Secondary | ICD-10-CM | POA: Insufficient documentation

## 2016-01-23 DIAGNOSIS — K8050 Calculus of bile duct without cholangitis or cholecystitis without obstruction: Secondary | ICD-10-CM | POA: Diagnosis not present

## 2016-01-23 DIAGNOSIS — K8070 Calculus of gallbladder and bile duct without cholecystitis without obstruction: Secondary | ICD-10-CM | POA: Diagnosis not present

## 2016-01-23 LAB — CBC
HCT: 34.3 % — ABNORMAL LOW (ref 36.0–46.0)
Hemoglobin: 12 g/dL (ref 12.0–15.0)
MCH: 29.9 pg (ref 26.0–34.0)
MCHC: 35 g/dL (ref 30.0–36.0)
MCV: 85.5 fL (ref 78.0–100.0)
Platelets: 211 10*3/uL (ref 150–400)
RBC: 4.01 MIL/uL (ref 3.87–5.11)
RDW: 12.9 % (ref 11.5–15.5)
WBC: 8.2 10*3/uL (ref 4.0–10.5)

## 2016-01-23 LAB — URINALYSIS, ROUTINE W REFLEX MICROSCOPIC
Bilirubin Urine: NEGATIVE
Glucose, UA: NEGATIVE mg/dL
Hgb urine dipstick: NEGATIVE
Ketones, ur: NEGATIVE mg/dL
Leukocytes, UA: NEGATIVE
Nitrite: NEGATIVE
Protein, ur: NEGATIVE mg/dL
Specific Gravity, Urine: 1.025 (ref 1.005–1.030)
pH: 6 (ref 5.0–8.0)

## 2016-01-23 LAB — COMPREHENSIVE METABOLIC PANEL
ALT: 19 U/L (ref 14–54)
AST: 20 U/L (ref 15–41)
Albumin: 4.6 g/dL (ref 3.5–5.0)
Alkaline Phosphatase: 37 U/L — ABNORMAL LOW (ref 38–126)
Anion gap: 8 (ref 5–15)
BUN: 17 mg/dL (ref 6–20)
CO2: 20 mmol/L — ABNORMAL LOW (ref 22–32)
Calcium: 9.4 mg/dL (ref 8.9–10.3)
Chloride: 108 mmol/L (ref 101–111)
Creatinine, Ser: 0.74 mg/dL (ref 0.44–1.00)
GFR calc Af Amer: >60 mL/min (ref >60)
GFR calc non Af Amer: >60 mL/min (ref >60)
Glucose, Bld: 97 mg/dL (ref 65–99)
Potassium: 3.8 mmol/L (ref 3.5–5.1)
Sodium: 136 mmol/L (ref 135–145)
Total Bilirubin: 0.4 mg/dL (ref 0.3–1.2)
Total Protein: 7.4 g/dL (ref 6.5–8.1)

## 2016-01-23 LAB — LIPASE, BLOOD: Lipase: 28 U/L (ref 11–51)

## 2016-01-23 LAB — PREGNANCY, URINE: Preg Test, Ur: NEGATIVE

## 2016-01-23 MED ORDER — HYDROCODONE-ACETAMINOPHEN 5-325 MG PO TABS: 1.0000 | ORAL_TABLET | Freq: Four times a day (QID) | ORAL | 0 refills | Status: DC | PRN

## 2016-01-23 MED ORDER — ONDANSETRON HCL 4 MG/2ML IJ SOLN
4.0000 mg | Freq: Once | INTRAMUSCULAR | Status: AC
  Administered 2016-01-23: 4 mg via INTRAVENOUS
  Filled 2016-01-23: qty 2

## 2016-01-23 MED ORDER — ONDANSETRON 8 MG PO TBDP: 8.0000 mg | ORAL_TABLET | Freq: Three times a day (TID) | ORAL | 0 refills | Status: DC | PRN

## 2016-01-23 MED ORDER — HYDROMORPHONE HCL 1 MG/ML IJ SOLN
0.5000 mg | Freq: Once | INTRAMUSCULAR | Status: AC
  Administered 2016-01-23: 0.5 mg via INTRAVENOUS
  Filled 2016-01-23: qty 1

## 2016-01-23 MED ORDER — SODIUM CHLORIDE 0.9 % IV BOLUS (SEPSIS)
1000.0000 mL | Freq: Once | INTRAVENOUS | Status: AC
  Administered 2016-01-23: 1000 mL via INTRAVENOUS

## 2016-01-23 MED ORDER — HYDROMORPHONE HCL 1 MG/ML IJ SOLN
1.0000 mg | Freq: Once | INTRAMUSCULAR | Status: AC
  Administered 2016-01-23: 1 mg via INTRAVENOUS
  Filled 2016-01-23: qty 1

## 2016-01-23 NOTE — Discharge Instructions (Addendum)
It was our pleasure to provide your ER care today - we hope that you feel better.  Your ultrasound shows gallstones.  You may take hydrocodone as need for pain. No driving when taking hydrocodone. Also, do not take tylenol or acetaminophen containing medication when taking hydrocodone.  Take zofran as need for nausea.  Follow up with general surgeon in the coming week - see referral - call office this Monday AM to arrange appointment.   Return to ER if worse, new symptoms, fevers, persistent vomiting, other concern.   You were given pain medication in the ER - no driving for the next 6 hours.

## 2016-01-23 NOTE — ED Provider Notes (Signed)
[facility]-EMERGENCY DEPT Provider Note   CSN: [id number] Arrival date & time: 01/23/16  0142 By signing my name below, I, Cindy Jones, attest that this documentation has been prepared under the direction and in the presence of Cindy LaineKevin Ragan Duhon, MD. Electronically Signed: Bridgette HabermannMaria Jones, ED Scribe. 01/23/16. 2:17 AM.  History   Chief Complaint Chief Complaint  Patient presents with  . Abdominal Pain   HPI Comments: Cindy Jones is a 25 y.o. female with h/o GERD who presents to the Emergency Department complaining of 10/10, aching mid epigastric abdominal pain radiating to her back onset two weeks ago, worsening today. Pt also has associated nausea. Pt has an appointment with a gastroenterologist this week. Pain is exacerbated when eating and laying down. No alleviating factors noted. Pt denies fever, vomiting, diarrhea, or any other associated symptoms.   The history is provided by the patient. No language interpreter was used.    Past Medical History:  Diagnosis Date  . Binge eating disorder   . Frequent headaches   . GERD (gastroesophageal reflux disease)   . Migraines   . PID (acute pelvic inflammatory disease)   . Seasonal allergies   . Tourette disorder   . Urinary tract bacterial infections     Patient Active Problem List   Diagnosis Date Noted  . GERD (gastroesophageal reflux disease) 10/12/2015  . Encounter for preventive measure 07/14/2015  . Epigastric discomfort 07/14/2015  . Bulimia 04/20/2015  . Major depressive disorder with single episode, in partial remission (HCC) 04/20/2015  . Infection of urinary tract 03/10/2015  . Palmar wrist ganglion 12/01/2014  . Right knee injury 09/24/2014  . Attention deficit hyperactivity disorder (ADHD), predominantly inattentive type 04/16/2014  . ADD (attention deficit disorder) 01/15/2014  . Proteinuria 12/24/2013  . Migraine, unspecified, without mention of intractable migraine without mention of status migrainosus 12/02/2013  . PID  (acute pelvic inflammatory disease) 11/24/2013  . Routine screening for STI (sexually transmitted infection) 10/29/2013  . Tourette disease 10/18/2013  . Abnormal neurological exam 10/18/2013    Past Surgical History:  Procedure Laterality Date  . TONSILLECTOMY AND ADENOIDECTOMY  2013    OB History    Gravida Para Term Preterm AB Living   1 0 0 0 1 0   SAB TAB Ectopic Multiple Live Births   1 0 0 0         Home Medications    Prior to Admission medications   Medication Sig Start Date End Date Taking? Authorizing Provider  amphetamine-dextroamphetamine (ADDERALL XR) 25 MG 24 hr capsule Take 1 capsule by mouth daily. 01/20/16 01/19/17  Oletta DarterSalina Agarwal, MD  amphetamine-dextroamphetamine (ADDERALL XR) 25 MG 24 hr capsule Take 1 capsule by mouth daily. 01/20/16 01/19/17  Oletta DarterSalina Agarwal, MD  amphetamine-dextroamphetamine (ADDERALL XR) 25 MG 24 hr capsule Take 1 capsule by mouth daily. 01/20/16 01/19/17  Oletta DarterSalina Agarwal, MD  amphetamine-dextroamphetamine (ADDERALL) 10 MG tablet Take 1 tablet (10 mg total) by mouth daily. 01/20/16 01/19/17  Oletta DarterSalina Agarwal, MD  amphetamine-dextroamphetamine (ADDERALL) 10 MG tablet Take 1 tablet (10 mg total) by mouth daily. 01/20/16 01/19/17  Oletta DarterSalina Agarwal, MD  amphetamine-dextroamphetamine (ADDERALL) 10 MG tablet Take 1 tablet (10 mg total) by mouth daily. 01/20/16 01/19/17  Oletta DarterSalina Agarwal, MD  aspirin-acetaminophen-caffeine (EXCEDRIN MIGRAINE) [phone] MG per tablet Take 2 tablets by mouth every 6 (six) hours as needed for headache.    Historical Provider, MD  clotrimazole-betamethasone (LOTRISONE) cream Reported on 10/19/2015 12/27/14   Historical Provider, MD  fluticasone (FLONASE) 50 MCG/ACT nasal spray Place  2 sprays into both nostrils daily. 11/06/14   Ethelda Chick, MD  loratadine (CLARITIN) 10 MG tablet Take 1 tablet (10 mg total) by mouth daily. 11/06/14   Ethelda Chick, MD  Meth-Hyo-M Bl-Na Phos-Ph Sal (URIBEL) 118 MG CAPS  10/07/15   Historical Provider, MD    omeprazole (PRILOSEC) 20 MG capsule Take 1 capsule (20 mg total) by mouth 2 (two) times daily before a meal. 10/11/15   Renee A Kuneff, DO    Family History Family History  Problem Relation Age of Onset  . Alcohol abuse Mother   . Bipolar disorder Mother   . Cancer Father     sarcoma  . Tourette syndrome Maternal Aunt   . Alcohol abuse Maternal Aunt   . Hyperlipidemia Paternal Aunt   . Tourette syndrome Maternal Grandfather   . Heart disease Paternal Grandfather   . Alcohol abuse Maternal Uncle   . Suicidality Neg Hx     Social History Social History  Substance Use Topics  . Smoking status: Never Smoker  . Smokeless tobacco: Never Used  . Alcohol use 1.2 oz/week    2 Glasses of wine per week     Allergies   Latex; Naproxen; and Sulfa antibiotics   Review of Systems Review of Systems  Constitutional: Negative for fever.  HENT: Negative for sore throat.   Eyes: Negative for redness.  Respiratory: Negative for cough.   Cardiovascular: Negative for chest pain.  Gastrointestinal: Positive for abdominal pain and nausea. Negative for diarrhea and vomiting.  Genitourinary: Negative for flank pain.  Musculoskeletal: Positive for back pain.  Skin: Negative for rash.  Neurological: Negative for headaches.  Hematological: Negative for adenopathy.  Psychiatric/Behavioral: Negative for confusion.     Physical Exam Updated Vital Signs BP 146/96 (BP Location: Left Arm)   Pulse 97   Temp 98.4 F (36.9 C) (Oral)   Resp 20   SpO2 100%   Physical Exam  Constitutional: She appears well-developed and well-nourished.  HENT:  Mouth/Throat: Oropharynx is clear and moist.  Eyes: Conjunctivae are normal. No scleral icterus.  Neck: Neck supple.  Cardiovascular: Normal rate, regular rhythm, normal heart sounds and intact distal pulses.   No murmur heard. Pulmonary/Chest: Effort normal and breath sounds normal. No respiratory distress.  Abdominal: Bowel sounds are normal. She  exhibits no distension and no mass. There is tenderness in the epigastric area. There is no rebound and no guarding. No hernia.  Genitourinary:  Genitourinary Comments: No cva tenderness  Musculoskeletal: She exhibits no edema.  Neurological: She is alert.  Skin: Skin is warm and dry.  Psychiatric: She has a normal mood and affect. Her behavior is normal.  Nursing note and vitals reviewed.    ED Treatments / Results  DIAGNOSTIC STUDIES: Oxygen Saturation is 100% on RA, normal by my interpretation.    COORDINATION OF CARE: 2:16 AM Discussed treatment plan with pt at bedside which includes ultrasounds and lab work and pt agreed to plan.  Labs (all labs ordered are listed, but only abnormal results are displayed) Results for orders placed or performed during the hospital encounter of 01/23/16  Comprehensive metabolic panel  Result Value Ref Range   Sodium 136 135 - 145 mmol/L   Potassium 3.8 3.5 - 5.1 mmol/L   Chloride 108 101 - 111 mmol/L   CO2 20 (L) 22 - 32 mmol/L   Glucose, Bld 97 65 - 99 mg/dL   BUN 17 6 - 20 mg/dL   Creatinine, Ser 1.61 0.44 -  1.00 mg/dL   Calcium 9.4 8.9 - 16.1 mg/dL   Total Protein 7.4 6.5 - 8.1 g/dL   Albumin 4.6 3.5 - 5.0 g/dL   AST 20 15 - 41 U/L   ALT 19 14 - 54 U/L   Alkaline Phosphatase 37 (L) 38 - 126 U/L   Total Bilirubin 0.4 0.3 - 1.2 mg/dL   GFR calc non Af Amer >60 >60 mL/min   GFR calc Af Amer >60 >60 mL/min   Anion gap 8 5 - 15  CBC  Result Value Ref Range   WBC 8.2 4.0 - 10.5 K/uL   RBC 4.01 3.87 - 5.11 MIL/uL   Hemoglobin 12.0 12.0 - 15.0 g/dL   HCT 09.6 (L) 04.5 - 40.9 %   MCV 85.5 78.0 - 100.0 fL   MCH 29.9 26.0 - 34.0 pg   MCHC 35.0 30.0 - 36.0 g/dL   RDW 81.1 91.4 - 78.2 %   Platelets 211 150 - 400 K/uL  Lipase, blood  Result Value Ref Range   Lipase 28 11 - 51 U/L   US Abdomen Limited  Result Date: 01/23/2016 CLINICAL DATA:  RIGHT upper quadrant pain since March, worsening for 2 weeks. EXAM: US ABDOMEN LIMITED - RIGHT  UPPER QUADRANT COMPARISON:  MRI abdomen December 31, 2013 FINDINGS: Gallbladder: Multiple tiny echogenic gallstones with acoustic shadowing. No gallbladder wall thickening or pericholecystic fluid. No sonographic Murphy's sign elicited. Common bile duct: Diameter: 3 mm Liver: No focal lesion identified. Within normal limits in parenchymal echogenicity. Hepatopetal portal vein. IMPRESSION: Cholelithiasis without sonographic findings of acute cholecystitis. Electronically Signed   By: Awilda Metro M.D.   On: 01/23/2016 03:50    EKG  EKG Interpretation None       Radiology US Abdomen Limited  Result Date: 01/23/2016 CLINICAL DATA:  RIGHT upper quadrant pain since March, worsening for 2 weeks. EXAM: US ABDOMEN LIMITED - RIGHT UPPER QUADRANT COMPARISON:  MRI abdomen December 31, 2013 FINDINGS: Gallbladder: Multiple tiny echogenic gallstones with acoustic shadowing. No gallbladder wall thickening or pericholecystic fluid. No sonographic Murphy's sign elicited. Common bile duct: Diameter: 3 mm Liver: No focal lesion identified. Within normal limits in parenchymal echogenicity. Hepatopetal portal vein. IMPRESSION: Cholelithiasis without sonographic findings of acute cholecystitis. Electronically Signed   By: Awilda Metro M.D.   On: 01/23/2016 03:50    Procedures Procedures (including critical care time)  Medications Ordered in ED Medications - No data to display   Initial Impression / Assessment and Plan / ED Course  I have reviewed the triage vital signs and the nursing notes.  Pertinent labs & imaging results that were available during my care of the patient were reviewed by me and considered in my medical decision making (see chart for details).  Clinical Course    Pt has ride, does not have to drive.  Dilaudid iv, zofran iv.  Labs.  Ultrasound.   Reviewed nursing notes and prior charts for additional history. Pt with MRi abd 2 yrs ago, had gallstones then.   Recheck pain  improved. Afeb. No nv.   Discussed u/s w pt.   Will give refer to gen surgery f/u this week.  Patient currently appears stable for d/c.     Final Clinical Impressions(s) / ED Diagnoses   Final diagnoses:  None   I personally performed the services described in this documentation, which was scribed in my presence. The recorded information has been reviewed and considered. Cindy Laine, MD   New Prescriptions New Prescriptions  No medications on file     Cindy Laine, MD 01/23/16 0401

## 2016-01-23 NOTE — ED Notes (Signed)
Patient d/c'd self care.  F/U and medications reviewed.  Patient verbalized understanding. 

## 2016-01-23 NOTE — ED Triage Notes (Signed)
Patient here from home with complaints of mid epigastric abd pain for 2 weeks that has gotten worse. Appt with gastro soon. Reports that primary md thinks she may have a hernia. Pain 10/10 radiating into back.

## 2016-01-23 NOTE — ED Notes (Signed)
Patient ambulatory to restroom for urine specimen.

## 2016-01-24 ENCOUNTER — Ambulatory Visit (HOSPITAL_COMMUNITY): Payer: Self-pay | Admitting: Licensed Clinical Social Worker

## 2016-01-24 ENCOUNTER — Telehealth (HOSPITAL_COMMUNITY): Payer: Self-pay | Admitting: Licensed Clinical Social Worker

## 2016-01-26 ENCOUNTER — Ambulatory Visit (INDEPENDENT_AMBULATORY_CARE_PROVIDER_SITE_OTHER): Payer: BLUE CROSS/BLUE SHIELD | Admitting: Gastroenterology

## 2016-01-26 ENCOUNTER — Ambulatory Visit (INDEPENDENT_AMBULATORY_CARE_PROVIDER_SITE_OTHER): Payer: BLUE CROSS/BLUE SHIELD | Admitting: Licensed Clinical Social Worker

## 2016-01-26 ENCOUNTER — Encounter: Payer: Self-pay | Admitting: Gastroenterology

## 2016-01-26 VITALS — BP 124/60 | HR 76 | Ht 65.5 in | Wt 169.0 lb

## 2016-01-26 DIAGNOSIS — K8020 Calculus of gallbladder without cholecystitis without obstruction: Secondary | ICD-10-CM

## 2016-01-26 DIAGNOSIS — F900 Attention-deficit hyperactivity disorder, predominantly inattentive type: Secondary | ICD-10-CM | POA: Diagnosis not present

## 2016-01-26 DIAGNOSIS — G8929 Other chronic pain: Secondary | ICD-10-CM

## 2016-01-26 DIAGNOSIS — F952 Tourette's disorder: Secondary | ICD-10-CM

## 2016-01-26 DIAGNOSIS — R1013 Epigastric pain: Secondary | ICD-10-CM | POA: Diagnosis not present

## 2016-01-26 NOTE — Patient Instructions (Signed)
If you are age 25 or older, your body mass index should be between 23-30. Your Body mass index is 27.7 kg/m. If this is out of the aforementioned range listed, please consider follow up with your Primary Care Provider.  If you are age 25 or younger, your body mass index should be between 19-25. Your Body mass index is 27.7 kg/m. If this is out of the aformentioned range listed, please consider follow up with your Primary Care Provider.   You have been scheduled for an endoscopy. Please follow written instructions given to you at your visit today. If you use inhalers (even only as needed), please bring them with you on the day of your procedure. Your physician has requested that you go to www.startemmi.com and enter the access code given to you at your visit today. This web site gives a general overview about your procedure. However, you should still follow specific instructions given to you by our office regarding your preparation for the procedure.

## 2016-01-26 NOTE — Progress Notes (Signed)
HPI :  25 y/o female with a history of Tourette's, remote eating disorder, migraines, here for new patient visit for symptoms of abdominal pains.   She reports symptoms ongoing since February of this year. Pain comes and goes, is intermittnent. Usually occurs at night. Pain is in the epigastric area, no RUQ. She can feel it at times radiate into her back. The pain usually occurs after she eats, within 20-30 minutes she has pain. Pain lasts anywhere from 2 to 12 hours. On average the pain lasts about 6 hours. She has some regurgitation but no vomiting. If she fasts she doesn't feel it much. If she eats fatty or heavier meals she will more than likely has symptoms. Ice cream in particular bothers her. She has been on prilosec to 20mg  BID and it has not helped too much. She has taken excedrin once per week, but no other NSAIDs. She has a history of PID a few years ago. She has lost 10 lbs from 180 to 170 over the past 2 months, she thnks due to eating less due to symptoms. She has been to the ER this past week due to severe symptoms. She had an US done showing cholelithiasis, normal LFTs and lipase. She denies pyrosis, but has some chest pressure frequently and some discomfort in her chest at the time when she feels it in her epigastric area.   She was given percocet to take PRN for severe symptoms.     Past Medical History:  Diagnosis Date  . Binge eating disorder   . Frequent headaches   . GERD (gastroesophageal reflux disease)   . Migraines   . PID (acute pelvic inflammatory disease)   . Seasonal allergies   . Tourette disorder   . Urinary tract bacterial infections      Past Surgical History:  Procedure Laterality Date  . TONSILLECTOMY AND ADENOIDECTOMY  2013   Family History  Problem Relation Age of Onset  . Alcohol abuse Mother   . Bipolar disorder Mother   . Cancer Father     sarcoma  . Tourette syndrome Maternal Aunt   . Alcohol abuse Maternal Aunt   . Hyperlipidemia  Paternal Aunt   . Tourette syndrome Maternal Grandfather   . Heart disease Paternal Grandfather   . Alcohol abuse Maternal Uncle   . Suicidality Neg Hx    Social History  Substance Use Topics  . Smoking status: Never Smoker  . Smokeless tobacco: Never Used  . Alcohol use 1.2 oz/week    2 Glasses of wine per week   Current Outpatient Prescriptions  Medication Sig Dispense Refill  . ADDERALL XR 20 MG 24 hr capsule Take 20 mg by mouth daily.  0  . aspirin-acetaminophen-caffeine (EXCEDRIN MIGRAINE) 250-250-65 MG per tablet Take 2 tablets by mouth every 6 (six) hours as needed for headache.    . fluticasone (FLONASE) 50 MCG/ACT nasal spray Place 2 sprays into both nostrils daily. (Patient taking differently: Place 2 sprays into both nostrils daily as needed for allergies. ) 16 g 6  . HYDROcodone-acetaminophen (NORCO/VICODIN) 5-325 MG tablet Take 1-2 tablets by mouth every 6 (six) hours as needed for moderate pain. 15 tablet 0  . loratadine (CLARITIN) 10 MG tablet Take 1 tablet (10 mg total) by mouth daily. (Patient taking differently: Take 10 mg by mouth daily as needed for allergies. ) 30 tablet 11  . omeprazole (PRILOSEC) 20 MG capsule Take 1 capsule (20 mg total) by mouth 2 (two) times daily before  a meal. 60 capsule 3  . ondansetron (ZOFRAN ODT) 8 MG disintegrating tablet Take 1 tablet (8 mg total) by mouth every 8 (eight) hours as needed for nausea or vomiting. 10 tablet 0   No current facility-administered medications for this visit.    Allergies  Allergen Reactions  . Latex Swelling and Rash  . Naproxen Other (See Comments)    "Liver starts hurting" Diarrhea  . Sulfa Antibiotics Hives and Swelling     Review of Systems: All systems reviewed and negative except where noted in HPI.   Lab Results  Component Value Date   WBC 8.2 01/23/2016   HGB 12.0 01/23/2016   HCT 34.3 (L) 01/23/2016   MCV 85.5 01/23/2016   PLT 211 01/23/2016    Lab Results  Component Value Date    CREATININE 0.74 01/23/2016   BUN 17 01/23/2016   NA 136 01/23/2016   K 3.8 01/23/2016   CL 108 01/23/2016   CO2 20 (L) 01/23/2016    Lab Results  Component Value Date   ALT 19 01/23/2016   AST 20 01/23/2016   ALKPHOS 37 (L) 01/23/2016   BILITOT 0.4 01/23/2016    Lab Results  Component Value Date   LIPASE 28 01/23/2016      US Abdomen Limited  Result Date: 01/23/2016 CLINICAL DATA:  RIGHT upper quadrant pain since March, worsening for 2 weeks. EXAM: US ABDOMEN LIMITED - RIGHT UPPER QUADRANT COMPARISON:  MRI abdomen December 31, 2013 FINDINGS: Gallbladder: Multiple tiny echogenic gallstones with acoustic shadowing. No gallbladder wall thickening or pericholecystic fluid. No sonographic Murphy's sign elicited. Common bile duct: Diameter: 3 mm Liver: No focal lesion identified. Within normal limits in parenchymal echogenicity. Hepatopetal portal vein. IMPRESSION: Cholelithiasis without sonographic findings of acute cholecystitis. Electronically Signed   By: Awilda Metro M.D.   On: 01/23/2016 03:50    Physical Exam: BP 124/60   Pulse 76   Ht 5' 5.5" (1.664 m)   Wt 169 lb (76.7 kg)   BMI 27.70 kg/m  Constitutional: Pleasant,well-developed, female in no acute distress. HEENT: Normocephalic and atraumatic. Conjunctivae are normal. No scleral icterus. Neck supple.  Cardiovascular: Normal rate, regular rhythm.  Pulmonary/chest: Effort normal and breath sounds normal. No wheezing, rales or rhonchi. Abdominal: Soft, nondistended, mild epigastric TTP. There are no masses palpable. No hepatomegaly. Extremities: no edema Lymphadenopathy: No cervical adenopathy noted. Neurological: Alert and oriented to person place and time. Skin: Skin is warm and dry. No rashes noted. Psychiatric: Normal mood and affect. Behavior is normal.   ASSESSMENT AND PLAN: 25 y/o female presenting with several months of intermittent epigastric pain, worsening in severity over time. Gallstones noted on  [place]. While it is possible she is having biliary colic / symptoms from gallstones, the duration of how long her pain lasts is a bit atypical. Other etiologies include PUD / H pylori gastritis, esophagitis / GERD, etc. She has had prior CT and MRI imaging of the abdomen for another issue in 2015 showing a small lesion of the spleen, what is thought to most likely be a benign hemangioma on MRI, and unlikely related to her current presentation.    I think an EGD is reasonable at this time to evaluate her upper tract prior to ensure no other pathology causing her symptoms prior to proceeding with cholecystectomy. I discussed risks / benefits of endoscopy and anesthesia with her and she wished to proceed. I otherwise agree with a surgical evaluation for possible cholecystectomy if this is negative, she is  being seen next week for this.   Ileene PatrickSteven Jeovanni Heuring, MD [facility] Pager [phone]  CC: Natalia LeatherwoodKuneff, Renee A, DO

## 2016-01-27 ENCOUNTER — Encounter: Payer: Self-pay | Admitting: Gastroenterology

## 2016-01-27 ENCOUNTER — Ambulatory Visit (AMBULATORY_SURGERY_CENTER): Payer: BLUE CROSS/BLUE SHIELD | Admitting: Gastroenterology

## 2016-01-27 VITALS — BP 107/86 | HR 64 | Temp 99.5°F | Resp 14 | Ht 65.0 in | Wt 169.0 lb

## 2016-01-27 DIAGNOSIS — K2950 Unspecified chronic gastritis without bleeding: Secondary | ICD-10-CM | POA: Diagnosis not present

## 2016-01-27 DIAGNOSIS — K319 Disease of stomach and duodenum, unspecified: Secondary | ICD-10-CM

## 2016-01-27 DIAGNOSIS — R1013 Epigastric pain: Secondary | ICD-10-CM

## 2016-01-27 MED ORDER — SODIUM CHLORIDE 0.9 % IV SOLN: 500.0000 mL | INTRAVENOUS | Status: DC

## 2016-01-27 NOTE — Op Note (Signed)
[facility] Patient Name: Cindy Jones Procedure Date: 01/27/2016 3:53 PM MRN: [id number] Endoscopist: Viviann Spare P. Adela Lank , MD Age: 25 Referring MD:  Date of Birth: 1991/03/08 Gender: Female Account #: [id number] Procedure:                Upper GI endoscopy Indications:              Epigastric abdominal pain Medicines:                Monitored Anesthesia Care Procedure:                Pre-Anesthesia Assessment:                           - Prior to the procedure, a History and Physical                            was performed, and patient medications and                            allergies were reviewed. The patient's tolerance of                            previous anesthesia was also reviewed. The risks                            and benefits of the procedure and the sedation                            options and risks were discussed with the patient.                            All questions were answered, and informed consent                            was obtained. Prior Anticoagulants: The patient has                            taken no previous anticoagulant or antiplatelet                            agents. ASA Grade Assessment: II - A patient with                            mild systemic disease. After reviewing the risks                            and benefits, the patient was deemed in                            satisfactory condition to undergo the procedure.                           After obtaining informed consent, the endoscope was  passed under direct vision. Throughout the                            procedure, the patient's blood pressure, pulse, and                            oxygen saturations were monitored continuously. The                            Model [id number]-[id number]) scope was introduced                            through the mouth, and advanced to the second part                            of duodenum. The upper  GI endoscopy was                            accomplished without difficulty. The patient                            tolerated the procedure well. Scope In: Scope Out: Findings:                 Esophagogastric landmarks were identified: the                            Z-line was found at 39 cm, the gastroesophageal                            junction was found at 39 cm and the upper extent of                            the gastric folds was found at 39 cm from the                            incisors.                           The exam of the esophagus was otherwise normal.                           Diffuse mildly erythematous mucosa was found in the                            gastric body without focal ulceration. Biopsies                            were taken with a cold forceps for Helicobacter                            pylori testing.                           Suspected extrinsic compression on the  stomach was                            found in the gastric fundus, versus less likely                            subepithelial lesion.                           The exam of the stomach was otherwise normal.                           The duodenal bulb, second portion of the duodenum                            and area of the papilla were normal. Complications:            No immediate complications. Estimated blood loss:                            Minimal. Estimated Blood Loss:     Estimated blood loss was minimal. Impression:               - Esophagogastric landmarks identified.                           - Normal esophagus                           - Erythematous mucosa in the gastric body vs. less                            likely subepithelial lesion. Biopsied to rule out H                            pylori.                           - Extrinsic compression in the gastric fundus.                           - Normal duodenal bulb, second portion of the                            duodenum  and area of the papilla.                           Overall, it is more than likely that the extrinsic                            compression noted in the fundus is a benign                            process, but given the patient's symptoms would  recommend cross sectional imaging to ensure normal                            spleen size and rule out other subepithelial                            gastric pathology. Recommendation:           - Patient has a contact number available for                            emergencies. The signs and symptoms of potential                            delayed complications were discussed with the                            patient. Return to normal activities tomorrow.                            Written discharge instructions were provided to the                            patient.                           - Resume previous diet.                           - Continue present medications.                           - Await pathology results.                           - Recommend MRI abdomen with and without contrast                            given prior CT scans in 2015 which led to MRI to                            clarify previously noted splenic lesion (thought to                            more than likely be benign hemangioma at the time) Viviann Spare P. Adela Lank, MD 01/27/2016 4:28:32 PM This report has been signed electronically.

## 2016-01-27 NOTE — Progress Notes (Signed)
Spontaneous respirations throughout. VSS. Resting comfortably. To PACU on room air. Report to  Penny RN.  

## 2016-01-27 NOTE — Patient Instructions (Signed)
YOU HAD AN ENDOSCOPIC PROCEDURE TODAY AT THE [facility]:   Refer to the procedure report that was given to you for any specific questions about what was found during the examination.  If the procedure report does not answer your questions, please call your gastroenterologist to clarify.  If you requested that your care partner not be given the details of your procedure findings, then the procedure report has been included in a sealed envelope for you to review at your convenience later.  YOU SHOULD EXPECT: Some feelings of bloating in the abdomen. Passage of more gas than usual.  Walking can help get rid of the air that was put into your GI tract during the procedure and reduce the bloating. If you had a lower endoscopy (such as a colonoscopy or flexible sigmoidoscopy) you may notice spotting of blood in your stool or on the toilet paper. If you underwent a bowel prep for your procedure, you may not have a normal bowel movement for a few days.  Please Note:  You might notice some irritation and congestion in your nose or some drainage.  This is from the oxygen used during your procedure.  There is no need for concern and it should clear up in a day or so.  SYMPTOMS TO REPORT IMMEDIATELY:   Following lower endoscopy (colonoscopy or flexible sigmoidoscopy):  Excessive amounts of blood in the stool  Significant tenderness or worsening of abdominal pains  Swelling of the abdomen that is new, acute  Fever of 100F or higher    For urgent or emergent issues, a gastroenterologist can be reached at any hour by calling [phone].   DIET:  We do recommend a small meal at first, but then you may proceed to your regular diet.  Drink plenty of fluids but you should avoid alcoholic beverages for 24 hours.  ACTIVITY:  You should plan to take it easy for the rest of today and you should NOT DRIVE or use heavy machinery until tomorrow (because of the sedation medicines used during the test).     FOLLOW UP: Our staff will call the number listed on your records the next business day following your procedure to check on you and address any questions or concerns that you may have regarding the information given to you following your procedure. If we do not reach you, we will leave a message.  However, if you are feeling well and you are not experiencing any problems, there is no need to return our call.  We will assume that you have returned to your regular daily activities without incident.  If any biopsies were taken you will be contacted by phone or by letter within the next 1-3 weeks.  Please call us at [phone] if you have not heard about the biopsies in 3 weeks.    SIGNATURES/CONFIDENTIALITY: You and/or your care partner have signed paperwork which will be entered into your electronic medical record.  These signatures attest to the fact that that the information above on your After Visit Summary has been reviewed and is understood.  Full responsibility of the confidentiality of this discharge information lies with you and/or your care-partner.   MRI will be ordered by Dr Lanetta InchArmbruster's nurse and they will call you with details  Resume previous diet and medications   Await biopsy results

## 2016-01-27 NOTE — Progress Notes (Signed)
Called to room to assist during endoscopic procedure.  Patient ID and intended procedure confirmed with present staff. Received instructions for my participation in the procedure from the performing physician.  

## 2016-01-28 ENCOUNTER — Other Ambulatory Visit: Payer: Self-pay

## 2016-01-28 ENCOUNTER — Telehealth: Payer: Self-pay | Admitting: *Deleted

## 2016-01-28 DIAGNOSIS — D739 Disease of spleen, unspecified: Principal | ICD-10-CM

## 2016-01-28 DIAGNOSIS — D7389 Other diseases of spleen: Secondary | ICD-10-CM

## 2016-01-28 NOTE — Progress Notes (Signed)
Patient scheduled [facility] of abdomen [facility]02[facility] at 6:45 a.m NPO 4 hours prior.

## 2016-01-28 NOTE — Telephone Encounter (Signed)
  Follow up Call-  Call back number 01/27/2016  Post procedure Call Back phone  # [phone]  Permission to leave phone message Yes  Some recent data might be hidden     Patient questions:  Left message to call us if necessary.

## 2016-01-31 ENCOUNTER — Telehealth: Payer: Self-pay

## 2016-01-31 ENCOUNTER — Ambulatory Visit: Payer: Self-pay | Admitting: Surgery

## 2016-01-31 ENCOUNTER — Ambulatory Visit (HOSPITAL_COMMUNITY): Payer: BLUE CROSS/BLUE SHIELD | Admitting: Licensed Clinical Social Worker

## 2016-01-31 ENCOUNTER — Encounter (HOSPITAL_COMMUNITY): Payer: Self-pay | Admitting: Licensed Clinical Social Worker

## 2016-01-31 DIAGNOSIS — F952 Tourette's disorder: Secondary | ICD-10-CM

## 2016-01-31 DIAGNOSIS — K8010 Calculus of gallbladder with chronic cholecystitis without obstruction: Secondary | ICD-10-CM | POA: Diagnosis not present

## 2016-01-31 DIAGNOSIS — F900 Attention-deficit hyperactivity disorder, predominantly inattentive type: Secondary | ICD-10-CM

## 2016-01-31 NOTE — H&P (Signed)
Cindy Jones 01/31/2016 2:23 PM Location: [facility] Patient #: [id number] DOB: 02/03/1991 Married / Language: [place] / Race: White Female  History of Present Illness Ardeth Sportsman MD; 01/31/2016 5:13 PM) The patient is a 25 year old female who presents with non-malignant abdominal pain. Note for "Non-malignant abdominal pain": Patient sent for surgical consultation for abdominal pain and gallstones. In the past rather well controlled. Dr. Jorene Minors. Tamms  25 year old female history of Tourette's and eating disorder. Goes by Cindy Jones as well. (challenge to get all of her IDs changed) She had intermittent abdominal pains for the past 6 months. She describes it usually triggered by eating. Bad attack after ice cream. Another after having a hamburger. Had a good emergency room. Felt epigastric pain. Mainly in the upper abdomen on both sides. Sometimes worse on the right. Would feel nauseated. Some retching. Saw primary care physician. No improvement on proton pump inhibitors. Went to the emergency room. Given her history of bulimia they thought maybe she had a hiatal hernia since her some question of dysphasia. Sent to gastroenterology. EGD Endoscopy performed. No hiatal hernia. No gastritis. 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 Dr. Adela Lank was wondering about repeating assayed to see if that has changed at all since he thought he could see spleen tip pushing onto her distended upper stomach by EGD She denies any left-sided pain really. She does not smoke.  No personal nor family history of GI/colon cancer, inflammatory bowel disease, irritable bowel syndrome, allergy such as Celiac Sprue, dietary/dairy problems, colitis, ulcers nor gastritis. No recent sick contacts/gastroenteritis. No travel outside the country. No changes in diet. No dysphagia to solids or  liquids. No significant heartburn or reflux. No hematochezia, hematemesis, coffee ground emesis. No evidence of prior gastric/peptic ulceration.   Other Problems Gilmer Mor, CMA; 01/31/2016 2:23 PM) Anxiety Disorder Cholelithiasis Heart murmur  Past Surgical History Gilmer Mor, CMA; 01/31/2016 2:23 PM) Oral Surgery Tonsillectomy  Diagnostic Studies History Gilmer Mor, CMA; 01/31/2016 2:23 PM) Colonoscopy never Mammogram never Pap Smear 1-5 years ago  Allergies Gilmer Mor, CMA; 01/31/2016 2:24 PM) Sulfa Antibiotics Latex Exam Gloves *MEDICAL DEVICES AND SUPPLIES* Naproxen *ANALGESICS - ANTI-INFLAMMATORY*  Medication History (Sonya Bynum, CMA; 01/31/2016 2:24 PM) Adderall XR (25MG  Capsule ER 24HR, Oral) Active. Fluticasone Propionate (50MCG/ACT Suspension, Nasal as needed) Active. Medications Reconciled  Social History Gilmer Mor, CMA; 01/31/2016 2:23 PM) Alcohol use Occasional alcohol use. Caffeine use Coffee. No drug use Tobacco use Never smoker.  Family History Gilmer Mor, CMA; 01/31/2016 2:23 PM) Alcohol Abuse Mother. Cancer Father. Depression Mother.  Pregnancy / Birth History Gilmer Mor, CMA; 01/31/2016 2:23 PM) Age at menarche 13 years. Gravida 0 Para 0 Regular periods     Review of Systems Lamar Laundry Bynum CMA; 01/31/2016 2:23 PM) General Present- Appetite Loss, Fatigue and Weight Loss. Not Present- Chills, Fever, Night Sweats and Weight Gain. Skin Present- Dryness. Not Present- Change in Wart/Mole, Hives, Jaundice, New Lesions, Non-Healing Wounds, Rash and Ulcer. HEENT Present- Wears glasses/contact lenses. Not Present- Earache, Hearing Loss, Hoarseness, Nose Bleed, Oral Ulcers, Ringing in the Ears, Seasonal Allergies, Sinus Pain, Sore Throat, Visual Disturbances and Yellow Eyes. Cardiovascular Present- Chest Pain, Difficulty Breathing Lying Down, Shortness of Breath and Swelling of Extremities. Not Present- Leg Cramps,  Palpitations and Rapid Heart Rate. Gastrointestinal Present- Abdominal Pain, Bloating, Constipation and Indigestion. Not Present- Bloody Stool, Change in Bowel Habits,  Chronic diarrhea, Difficulty Swallowing, Excessive gas, Gets full quickly at meals, Hemorrhoids, Nausea, Rectal Pain and Vomiting. Female Genitourinary Present- Frequency and Pelvic Pain. Not Present- Nocturia, Painful Urination and Urgency. Musculoskeletal Present- Back Pain. Not Present- Joint Pain, Joint Stiffness, Muscle Pain, Muscle Weakness and Swelling of Extremities. Neurological Present- Headaches. Not Present- Decreased Memory, Fainting, Numbness, Seizures, Tingling, Tremor, Trouble walking and Weakness. Psychiatric Present- Anxiety. Not Present- Bipolar, Change in Sleep Pattern, Depression, Fearful and Frequent crying. Endocrine Not Present- Cold Intolerance, Excessive Hunger, Hair Changes, Heat Intolerance, Hot flashes and New Diabetes. Hematology Present- Gland problems. Not Present- Blood Thinners, Easy Bruising, Excessive bleeding, HIV and Persistent Infections.  Vitals (Sonya Bynum CMA; 01/31/2016 2:24 PM) 01/31/2016 2:23 PM Weight: 168 lb Height: 65in Body Surface Area: 1.84 m Body Mass Index: 27.96 kg/m  Temp.: 98.15F(Temporal)  Pulse: 81 (Regular)  BP: 122/76 (Sitting, Left Arm, Standard)      Physical Exam Ardeth Sportsman(Shacarra Choe C. Shaquel Josephson MD; 01/31/2016 3:16 PM)  General Mental Status-Alert. General Appearance-Not in acute distress, Not Sickly. Orientation-Oriented X3. Hydration-Well hydrated. Voice-Normal.  Integumentary Global Assessment Upon inspection and palpation of skin surfaces of the - Axillae: non-tender, no inflammation or ulceration, no drainage. and Distribution of scalp and body hair is normal. General Characteristics Temperature - normal warmth is noted.  Head and Neck Head-normocephalic, atraumatic with no lesions or palpable masses. Face Global Assessment - atraumatic,  no absence of expression. Neck Global Assessment - no abnormal movements, no bruit auscultated on the right, no bruit auscultated on the left, no decreased range of motion, non-tender. Trachea-midline. Thyroid Gland Characteristics - non-tender.  Eye Eyeball - Left-Extraocular movements intact, No Nystagmus. Eyeball - Right-Extraocular movements intact, No Nystagmus. Cornea - Left-No Hazy. Cornea - Right-No Hazy. Sclera/Conjunctiva - Left-No scleral icterus, No Discharge. Sclera/Conjunctiva - Right-No scleral icterus, No Discharge. Pupil - Left-Direct reaction to light normal. Pupil - Right-Direct reaction to light normal.  ENMT Ears Pinna - Left - no drainage observed, no generalized tenderness observed. Right - no drainage observed, no generalized tenderness observed. Nose and Sinuses External Inspection of the Nose - no destructive lesion observed. Inspection of the nares - Left - quiet respiration. Right - quiet respiration. Mouth and Throat Lips - Upper Lip - no fissures observed, no pallor noted. Lower Lip - no fissures observed, no pallor noted. Nasopharynx - no discharge present. Oral Cavity/Oropharynx - Tongue - no dryness observed. Oral Mucosa - no cyanosis observed. Hypopharynx - no evidence of airway distress observed.  Chest and Lung Exam Inspection Movements - Normal and Symmetrical. Accessory muscles - No use of accessory muscles in breathing. Palpation Palpation of the chest reveals - Non-tender. Auscultation Breath sounds - Normal and Clear.  Cardiovascular Auscultation Rhythm - Regular. Murmurs & Other Heart Sounds - Auscultation of the heart reveals - No Murmurs and No Systolic Clicks.  Abdomen Inspection Inspection of the abdomen reveals - No Visible peristalsis and No Abnormal pulsations. Umbilicus - No Bleeding, No Urine drainage. Palpation/Percussion Palpation and Percussion of the abdomen reveal - Soft, Non Tender, No Rebound  tenderness, No Rigidity (guarding) and No Cutaneous hyperesthesia. Note: Mild epigastric and right upper quadrant discomfort. No true Eulah PontMurphy sign now. The rest the abdomen is soft and nontender. Nndistended. No guarding. No umbilical no other hernias. No diastases recti  Female Genitourinary Sexual Maturity Tanner 5 - Adult hair pattern. Note: No vaginal bleeding nor discharge  Peripheral Vascular Upper Extremity Inspection - Left - No Cyanotic nailbeds, Not Ischemic. Right - No Cyanotic nailbeds, Not  Ischemic.  Neurologic Neurologic evaluation reveals -normal attention span and ability to concentrate, able to name objects and repeat phrases. Appropriate fund of knowledge , normal sensation and normal coordination. Mental Status Affect - not angry, not paranoid. Cranial Nerves-Normal Bilaterally. Gait-Normal.  Neuropsychiatric Mental status exam performed with findings of-able to articulate well with normal speech/language, rate, volume and coherence, thought content normal with ability to perform basic computations and apply abstract reasoning and no evidence of hallucinations, delusions, obsessions or homicidal/suicidal ideation.  Musculoskeletal Global Assessment Spine, Ribs and Pelvis - no instability, subluxation or laxity. Right Upper Extremity - no instability, subluxation or laxity.  Lymphatic Head & Neck  General Head & Neck Lymphatics: Bilateral - Description - No Localized lymphadenopathy. Axillary  General Axillary Region: Bilateral - Description - No Localized lymphadenopathy. Femoral & Inguinal  Generalized Femoral & Inguinal Lymphatics: Left - Description - No Localized lymphadenopathy. Right - Description - No Localized lymphadenopathy.    Assessment & Plan Ardeth Sportsman MD; 01/31/2016 3:17 PM)  CHRONIC CHOLECYSTITIS WITH CALCULUS (K80.10) Impression: Rather classic history and physical for symptomatic gallstones. Now with chronic soreness  suggestive of chronic cholecystitis.  The fact that she's had a negative endoscopy and no improvement on proton pump inhibitors more strongly argues towards gallbladder is the culprit.  I think she would benefit from cholecystectomy. Reasonable single site approach. She would like to get this done sooner versus later to avoid any future attacks.  Current Plans You are being scheduled for surgery - Our schedulers will call you.  You should hear from our office's scheduling department within 5 working days about the location, date, and time of surgery. We try to make accommodations for patient's preferences in scheduling surgery, but sometimes the OR schedule or the surgeon's schedule prevents [place] from making those accommodations.  If you have not heard from our office [phone]) in 5 working days, call the office and ask for your surgeon's nurse.  If you have other questions about your diagnosis, plan, or surgery, call the office and ask for your surgeon's nurse.  The anatomy & physiology of hepatobiliary & pancreatic function was discussed. The pathophysiology of gallbladder dysfunction was discussed. Natural history risks without surgery was discussed. I feel the risks of no intervention will lead to serious problems that outweigh the operative risks; therefore, I recommended cholecystectomy to remove the pathology. I explained laparoscopic techniques with possible need for an open approach. Probable cholangiogram to evaluate the bilary tract was explained as well.  Risks such as bleeding, infection, abscess, leak, injury to other organs, need for further treatment, heart attack, death, and other risks were discussed. I noted a good likelihood this will help address the problem. Possibility that this will not correct all abdominal symptoms was explained. Goals of post-operative recovery were discussed as well. We will work to minimize complications. An educational handout further explaining the  pathology and treatment options was given as well. Questions were answered. The patient expresses understanding & wishes to proceed with surgery.  Pt Education - Pamphlet Given - Laparoscopic Gallbladder Surgery: discussed with patient and provided information. Written instructions provided Pt Education - [facility] Laparosopic Post Op HCI (Srishti Strnad) Pt Education - [facility] Good Bowel Health (Chequita Mofield) Pt Education - Laparoscopic Cholecystectomy: gallbladder  Ardeth Sportsman, M.D., F.A.C.S. Gastrointestinal and Minimally Invasive Surgery [facility] [address], [address] [place], [place] [address] [phone] Main / Paging

## 2016-01-31 NOTE — Telephone Encounter (Signed)
  Follow up Call-  Call back number 01/27/2016  Post procedure Call Back phone  # [phone]  Permission to leave phone message Yes  Some recent data might be hidden     Patient questions:  Do you have a fever, pain , or abdominal swelling? No. Pain Score  0 *  Have you tolerated food without any problems? Yes.    Have you been able to return to your normal activities? Yes.    Do you have any questions about your discharge instructions: Diet   No. Medications  No. Follow up visit  No.  Do you have questions or concerns about your Care? No.  Actions: * If pain score is 4 or above: No action needed, pain <4.
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
## 2016-02-01 ENCOUNTER — Encounter (HOSPITAL_COMMUNITY): Payer: Self-pay | Admitting: Licensed Clinical Social Worker
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
## 2016-02-02 ENCOUNTER — Ambulatory Visit (HOSPITAL_COMMUNITY): Payer: BLUE CROSS/BLUE SHIELD

## 2016-02-02 ENCOUNTER — Ambulatory Visit (HOSPITAL_COMMUNITY)
Admission: RE | Admit: 2016-02-02 | Discharge: 2016-02-02 | Disposition: A | Discharge status: Home / Self Care | Payer: BLUE CROSS/BLUE SHIELD | Source: Ambulatory Visit | Attending: Gastroenterology | Admitting: Gastroenterology

## 2016-02-02 ENCOUNTER — Ambulatory Visit (INDEPENDENT_AMBULATORY_CARE_PROVIDER_SITE_OTHER): Payer: BLUE CROSS/BLUE SHIELD | Admitting: Licensed Clinical Social Worker

## 2016-02-02 ENCOUNTER — Telehealth: Payer: Self-pay | Admitting: Gastroenterology

## 2016-02-02 DIAGNOSIS — D7389 Other diseases of spleen: Secondary | ICD-10-CM | POA: Insufficient documentation

## 2016-02-02 DIAGNOSIS — D739 Disease of spleen, unspecified: Secondary | ICD-10-CM

## 2016-02-02 DIAGNOSIS — K8020 Calculus of gallbladder without cholecystitis without obstruction: Secondary | ICD-10-CM | POA: Insufficient documentation

## 2016-02-02 DIAGNOSIS — F952 Tourette's disorder: Secondary | ICD-10-CM

## 2016-02-02 MED ORDER — GADOBENATE DIMEGLUMINE 529 MG/ML IV SOLN
15.0000 mL | Freq: Once | INTRAVENOUS | Status: AC | PRN
  Administered 2016-02-02: 15 mL via INTRAVENOUS

## 2016-02-02 NOTE — Telephone Encounter (Signed)
Left a message for the patient to call back to discuss. Her doctor is out of the office this afternoon.

## 2016-02-03 ENCOUNTER — Other Ambulatory Visit: Payer: Self-pay

## 2016-02-03 NOTE — Telephone Encounter (Signed)
I had noted extrinsic compression of the fundus of her stomach on EGD, which appeared very prominent. Given her symptoms and prior abnormal imaging of the spleen I had recommended follow up imaging to ensure normal. She previously had a CT scan in 2015 for which a follow up MRI was recommended. I think the MRI would be most useful although a CT scan would also be okay if it is significantly cheaper for her. Some type of imaging should be done given her EGD findings if you can let her know. Thanks

## 2016-02-03 NOTE — Telephone Encounter (Signed)
Pt aware and states that she had the MRI last night. Pt wants to make sure that we call with results.

## 2016-02-04 NOTE — Progress Notes (Signed)
   Daily Group Progress Note  Program: Outpatient   Group Time: 5:15-6:45pm  Participation Level: Active  Behavioral Response: Appropriate  Type of Therapy:  Psychoeducation/Group process  Summary of Progress: Patient presented in timely manner and was engaged in discussion. Patient shared struggles with medical issues she is currently facing and identified ways in which unhelpful thinking was contributing to her anxiety.Patient was encouraged to utilize checking the facts and radical acceptance.  Patient interacted appropriately in the group and accepted feedback.  Patient denies SI/HI or any current immediate needs.    Donia GuilesJenny Jhoanna Heyde, MSW, LCSW, LCAS

## 2016-02-07 ENCOUNTER — Ambulatory Visit (INDEPENDENT_AMBULATORY_CARE_PROVIDER_SITE_OTHER): Payer: BLUE CROSS/BLUE SHIELD | Admitting: Licensed Clinical Social Worker

## 2016-02-07 DIAGNOSIS — F900 Attention-deficit hyperactivity disorder, predominantly inattentive type: Secondary | ICD-10-CM | POA: Diagnosis not present

## 2016-02-07 DIAGNOSIS — F952 Tourette's disorder: Secondary | ICD-10-CM | POA: Diagnosis not present

## 2016-02-08 ENCOUNTER — Encounter (HOSPITAL_COMMUNITY): Payer: Self-pay | Admitting: Licensed Clinical Social Worker

## 2016-02-08 NOTE — Progress Notes (Signed)
Daily Group Progress Note Program:  Outpatient  Group Time: 5:00-6:00 pm  Participation Level: Active  Behavioral Response: Appropriate  Type of Therapy:  Psychoeducation/Therapy  Summary of Progress: Pt participated in a discussion on discovering and reconstructing an enduring sense of self as an important aspect for improvement of symptoms of depression and anxiety. Pt was encouraged to accomplish her self-determined goals that will enhance her sense of well-being. Pt was receptive to suggestions and encouragement given during the intervention.   Nakiya Rallis S. Posey Jasmin, LCAS 

## 2016-02-09 ENCOUNTER — Ambulatory Visit (HOSPITAL_COMMUNITY): Payer: Self-pay | Admitting: Licensed Clinical Social Worker

## 2016-02-10 ENCOUNTER — Encounter (HOSPITAL_COMMUNITY): Payer: Self-pay | Admitting: Emergency Medicine

## 2016-02-10 ENCOUNTER — Telehealth (HOSPITAL_COMMUNITY): Payer: Self-pay | Admitting: Licensed Clinical Social Worker

## 2016-02-10 ENCOUNTER — Ambulatory Visit (HOSPITAL_COMMUNITY)
Admission: EM | Admit: 2016-02-10 | Discharge: 2016-02-10 | Disposition: A | Discharge status: Home / Self Care | Payer: BLUE CROSS/BLUE SHIELD | Attending: Internal Medicine | Admitting: Internal Medicine

## 2016-02-10 DIAGNOSIS — Z87440 Personal history of urinary (tract) infections: Secondary | ICD-10-CM | POA: Insufficient documentation

## 2016-02-10 DIAGNOSIS — Z91040 Latex allergy status: Secondary | ICD-10-CM | POA: Diagnosis not present

## 2016-02-10 DIAGNOSIS — F952 Tourette's disorder: Secondary | ICD-10-CM | POA: Insufficient documentation

## 2016-02-10 DIAGNOSIS — R319 Hematuria, unspecified: Secondary | ICD-10-CM

## 2016-02-10 DIAGNOSIS — K219 Gastro-esophageal reflux disease without esophagitis: Secondary | ICD-10-CM | POA: Insufficient documentation

## 2016-02-10 DIAGNOSIS — Z79899 Other long term (current) drug therapy: Secondary | ICD-10-CM | POA: Insufficient documentation

## 2016-02-10 DIAGNOSIS — R300 Dysuria: Secondary | ICD-10-CM | POA: Diagnosis present

## 2016-02-10 DIAGNOSIS — Z888 Allergy status to other drugs, medicaments and biological substances status: Secondary | ICD-10-CM | POA: Diagnosis not present

## 2016-02-10 DIAGNOSIS — Z882 Allergy status to sulfonamides status: Secondary | ICD-10-CM | POA: Insufficient documentation

## 2016-02-10 DIAGNOSIS — N390 Urinary tract infection, site not specified: Secondary | ICD-10-CM | POA: Diagnosis not present

## 2016-02-10 LAB — POCT URINALYSIS DIP (DEVICE)
Bilirubin Urine: NEGATIVE
Glucose, UA: NEGATIVE mg/dL
Ketones, ur: NEGATIVE mg/dL
Nitrite: NEGATIVE
Protein, ur: NEGATIVE mg/dL
Specific Gravity, Urine: <=1.005 (ref 1.005–1.030)
Urobilinogen, UA: 0.2 mg/dL (ref 0.0–1.0)
pH: 6.5 (ref 5.0–8.0)

## 2016-02-10 MED ORDER — PHENAZOPYRIDINE HCL 200 MG PO TABS: 200.0000 mg | ORAL_TABLET | Freq: Three times a day (TID) | ORAL | 0 refills | Status: DC

## 2016-02-10 MED ORDER — NITROFURANTOIN MONOHYD MACRO 100 MG PO CAPS: 100.0000 mg | ORAL_CAPSULE | Freq: Two times a day (BID) | ORAL | 0 refills | Status: DC

## 2016-02-10 NOTE — ED Triage Notes (Signed)
Patient has a history of uti.  Patient is associated with [facility].  Patient has frequency and burning with urination.

## 2016-02-10 NOTE — ED Provider Notes (Signed)
CSN: [id number]     Arrival date & time 02/10/16  1803 History   None    No chief complaint on file.  (Consider location/radiation/quality/duration/timing/severity/associated sxs/prior Treatment) Patient has been having some dysuria and she has hx of frequent UTI's.     Dysuria  Pain quality:  Burning Pain severity:  Moderate Onset quality:  Gradual Timing:  Constant Progression:  Worsening Chronicity:  Recurrent Recent urinary tract infections: yes   Relieved by:  Antibiotics and phenazopyridine Ineffective treatments:  Phenazopyridine Urinary symptoms: frequent urination   Risk factors: recurrent urinary tract infections     Past Medical History:  Diagnosis Date  . Binge eating disorder   . Frequent headaches   . GERD (gastroesophageal reflux disease)   . Migraines   . PID (acute pelvic inflammatory disease)   . Seasonal allergies   . Tourette disorder   . Urinary tract bacterial infections    Past Surgical History:  Procedure Laterality Date  . TONSILLECTOMY AND ADENOIDECTOMY  2013   Family History  Problem Relation Age of Onset  . Alcohol abuse Mother   . Bipolar disorder Mother   . Cancer Father     sarcoma  . Tourette syndrome Maternal Aunt   . Alcohol abuse Maternal Aunt   . Hyperlipidemia Paternal Aunt   . Tourette syndrome Maternal Grandfather   . Heart disease Paternal Grandfather   . Alcohol abuse Maternal Uncle   . Suicidality Neg Hx    Social History  Substance Use Topics  . Smoking status: Never Smoker  . Smokeless tobacco: Never Used  . Alcohol use 1.2 oz/week    2 Glasses of wine per week   OB History    Gravida Para Term Preterm AB Living   1 0 0 0 1 0   SAB TAB Ectopic Multiple Live Births   1 0 0 0       Review of Systems  Constitutional: Negative.   HENT: Negative.   Eyes: Negative.   Respiratory: Negative.   Cardiovascular: Negative.   Gastrointestinal: Negative.   Endocrine: Negative.   Genitourinary: Positive for  dysuria.  Musculoskeletal: Negative.   Skin: Negative.   Allergic/Immunologic: Negative.   Neurological: Negative.   Hematological: Negative.   Psychiatric/Behavioral: Negative.     Allergies  Latex; Naproxen; and Sulfa antibiotics  Home Medications   Prior to Admission medications   Medication Sig Start Date End Date Taking? Authorizing Provider  ADDERALL XR 20 MG 24 hr capsule Take 20 mg by mouth daily. 12/29/15   Historical Provider, MD  aspirin-acetaminophen-caffeine (EXCEDRIN MIGRAINE) [phone] MG per tablet Take 2 tablets by mouth every 6 (six) hours as needed for headache.    Historical Provider, MD  fluticasone (FLONASE) 50 MCG/ACT nasal spray Place 2 sprays into both nostrils daily. Patient taking differently: Place 2 sprays into both nostrils daily as needed for allergies.  11/06/14   Ethelda ChickKristi M Smith, MD  HYDROcodone-acetaminophen (NORCO/VICODIN) 5-325 MG tablet Take 1-2 tablets by mouth every 6 (six) hours as needed for moderate pain. 01/23/16   Cathren LaineKevin Steinl, MD  loratadine (CLARITIN) 10 MG tablet Take 1 tablet (10 mg total) by mouth daily. Patient taking differently: Take 10 mg by mouth daily as needed for allergies.  11/06/14   Ethelda ChickKristi M Smith, MD  omeprazole (PRILOSEC) 20 MG capsule Take 1 capsule (20 mg total) by mouth 2 (two) times daily before a meal. 10/11/15   Renee A Kuneff, DO  ondansetron (ZOFRAN ODT) 8 MG disintegrating tablet Take 1  tablet (8 mg total) by mouth every 8 (eight) hours as needed for nausea or vomiting. 01/23/16   Cathren Laine, MD   Meds Ordered and Administered this Visit  Medications - No data to display  There were no vitals taken for this visit. No data found.   Physical Exam  Constitutional: She appears well-developed and well-nourished.  HENT:  Head: Normocephalic and atraumatic.  Eyes: EOM are normal. Pupils are equal, round, and reactive to light.  Neck: Normal range of motion. Neck supple.  Cardiovascular: Normal rate, regular rhythm and  normal heart sounds.   Pulmonary/Chest: Effort normal and breath sounds normal.  Abdominal: Soft. Bowel sounds are normal.  Nursing note and vitals reviewed.   Urgent Care Course   Clinical Course    Procedures (including critical care time)  Labs Review Labs Reviewed - No data to display  Imaging Review No results found.   Visual Acuity Review  Right Eye Distance:   Left Eye Distance:   Bilateral Distance:    Right Eye Near:   Left Eye Near:    Bilateral Near:         MDM   UTI - Macrobid 100mg  one po bid x 10 days #20 UA cx Pyridium 200mg  one po tid x 2 days #6   Deatra Canter, FNP 02/10/16 1932

## 2016-02-13 LAB — URINE CULTURE: Culture: >=100000 — AB

## 2016-02-14 ENCOUNTER — Ambulatory Visit (INDEPENDENT_AMBULATORY_CARE_PROVIDER_SITE_OTHER): Payer: BLUE CROSS/BLUE SHIELD | Admitting: Licensed Clinical Social Worker

## 2016-02-14 DIAGNOSIS — F900 Attention-deficit hyperactivity disorder, predominantly inattentive type: Secondary | ICD-10-CM

## 2016-02-14 DIAGNOSIS — F952 Tourette's disorder: Secondary | ICD-10-CM

## 2016-02-15 ENCOUNTER — Telehealth (HOSPITAL_COMMUNITY): Payer: Self-pay | Admitting: Emergency Medicine

## 2016-02-15 ENCOUNTER — Encounter (HOSPITAL_COMMUNITY): Payer: Self-pay | Admitting: Licensed Clinical Social Worker

## 2016-02-15 NOTE — Progress Notes (Signed)
Daily Group Progress Note Program:  Outpatient  Group Time: 5:00-6:00 pm  Participation Level: Active  Behavioral Response: Appropriate  Type of Therapy:  Psychoeducation/Therapy  Summary of Progress:  Pt participated in a discussion on childhood experiences affecting adult mental wellness. Survivors of negative childhood experiences often tend to develop unhealthy coping skills and difficulty regulating emotions which effect adult mental wellness, especially her anxiety. Pt was encouraged to continue using her positive coping skills for her depression, practice self-care and develop a positive support system. Pt was active during the intervention and was receptive to suggestions and feedback.  Vernona RiegerLisbeth S. Jonise Weightman, LCAS

## 2016-02-15 NOTE — Telephone Encounter (Signed)
Pt called.... notified of recent lab results from visit 10/12 Pt ID'd properly... Reports feeling nauseas Adv pt if sx are not getting better to return or to f/u w/PCP Education on safe sex given Also adv pt to notify partner(s) [facility] verb understanding.    Per dr. Dayton ScrapeMurray,   Urine culture was positive for E coli, sensitive to nitrofurantoin prescription given at urgent care visit 02/10/16. Finish nitrofurantoin. Recheck or followup primary care provider, Felix Pacinienee Kuneff, for further evaluation if symptoms persist.  Result note copied to patient's MyChart. Ria ClockLaura Murray MD

## 2016-02-16 ENCOUNTER — Ambulatory Visit (INDEPENDENT_AMBULATORY_CARE_PROVIDER_SITE_OTHER): Payer: BLUE CROSS/BLUE SHIELD | Admitting: Licensed Clinical Social Worker

## 2016-02-16 ENCOUNTER — Ambulatory Visit: Payer: Self-pay | Admitting: Internal Medicine

## 2016-02-16 ENCOUNTER — Encounter (HOSPITAL_COMMUNITY): Payer: Self-pay | Admitting: Licensed Clinical Social Worker

## 2016-02-16 DIAGNOSIS — F900 Attention-deficit hyperactivity disorder, predominantly inattentive type: Secondary | ICD-10-CM

## 2016-02-16 DIAGNOSIS — F952 Tourette's disorder: Secondary | ICD-10-CM

## 2016-02-16 NOTE — Progress Notes (Signed)
Daily Group Progress Note Program:  Outpatient   Group Time: 5:00-6:00 pm  Participation Level: Active  Behavioral Response: Appropriate  Type of Therapy:  Psychoeducation/Therapy  Summary of Progress: Pt participated in a discussion on coping with anxiety and depression on a daily basis. "Every day I live a lie because everyone sees me as a beautiful confident woman but no one sees me when I'm crying or having a panic attack." "That's not beautiful." Pt was encouraged to continue to talk about her feelings in group, journal her thoughts and feelings and continue to use her effective coping skills.    Vernona RiegerLisbeth S. [name], LCAS

## 2016-02-21 ENCOUNTER — Ambulatory Visit (INDEPENDENT_AMBULATORY_CARE_PROVIDER_SITE_OTHER): Payer: BLUE CROSS/BLUE SHIELD | Admitting: Family Medicine

## 2016-02-21 ENCOUNTER — Ambulatory Visit (INDEPENDENT_AMBULATORY_CARE_PROVIDER_SITE_OTHER): Payer: BLUE CROSS/BLUE SHIELD | Admitting: Licensed Clinical Social Worker

## 2016-02-21 ENCOUNTER — Encounter: Payer: Self-pay | Admitting: Family Medicine

## 2016-02-21 VITALS — BP 115/78 | HR 77 | Temp 98.5°F | Resp 20 | Ht 66.0 in | Wt 167.8 lb

## 2016-02-21 DIAGNOSIS — R300 Dysuria: Secondary | ICD-10-CM

## 2016-02-21 DIAGNOSIS — F952 Tourette's disorder: Secondary | ICD-10-CM | POA: Diagnosis not present

## 2016-02-21 DIAGNOSIS — L304 Erythema intertrigo: Secondary | ICD-10-CM | POA: Diagnosis not present

## 2016-02-21 DIAGNOSIS — F900 Attention-deficit hyperactivity disorder, predominantly inattentive type: Secondary | ICD-10-CM | POA: Diagnosis not present

## 2016-02-21 LAB — POC URINALSYSI DIPSTICK (AUTOMATED)
Bilirubin, UA: NEGATIVE
Blood, UA: NEGATIVE
Glucose, UA: NEGATIVE
Ketones, UA: NEGATIVE
Leukocytes, UA: NEGATIVE
Nitrite, UA: NEGATIVE
Protein, UA: NEGATIVE
Spec Grav, UA: 1.02
Urobilinogen, UA: 1
pH, UA: 7

## 2016-02-21 MED ORDER — CLOTRIMAZOLE-BETAMETHASONE 1-0.05 % EX CREA: 1.0000 "application " | TOPICAL_CREAM | Freq: Two times a day (BID) | CUTANEOUS | 1 refills | Status: DC

## 2016-02-21 NOTE — Progress Notes (Signed)
Cindy Jones , 18-Feb-1991, 25 y.o., female MRN: [id number] Patient Care Team    Relationship Specialty Notifications Start End  Cindy Leatherwoodenee A Taletha Twiford, DO PCP - General Family Medicine  07/14/15   Cindy SodaSteven Gross, MD Consulting Physician General Surgery  01/31/16   Cindy FrederickSteven Paul Armbruster, MD Consulting Physician Gastroenterology  01/31/16     CC: UTI  Subjective: Pt presents for an acute OV with complaints of recent UTI. She was seen in the urgent care and culture resulted with E.Coli (pansensitive). She was treated with macrobid x 10 days. She states the majority of her symptoms are resolved but she still feels a little residual discomfort at the end of her urinary stream. She has concerns the UTI was has not resolved. She denies Fever, chills or current nausea. She does states the macrobid did cause her to have nausea.  She is eating and drinking well.   Allergies  Allergen Reactions  . Latex Swelling and Rash  . Naproxen Other (See Comments)    "Liver starts hurting" Diarrhea  . Sulfa Antibiotics Hives and Swelling   Social History  Substance Use Topics  . Smoking status: Never Smoker  . Smokeless tobacco: Never Used  . Alcohol use 1.2 oz/week    2 Glasses of wine per week   Past Medical History:  Diagnosis Date  . Binge eating disorder   . Frequent headaches   . GERD (gastroesophageal reflux disease)   . Migraines   . PID (acute pelvic inflammatory disease)   . Seasonal allergies   . Tourette disorder   . Urinary tract bacterial infections    Past Surgical History:  Procedure Laterality Date  . TONSILLECTOMY AND ADENOIDECTOMY  2013   Family History  Problem Relation Age of Onset  . Alcohol abuse Mother   . Bipolar disorder Mother   . Cancer Father     sarcoma  . Tourette syndrome Maternal Aunt   . Alcohol abuse Maternal Aunt   . Hyperlipidemia Paternal Aunt   . Tourette syndrome Maternal Grandfather   . Heart disease Paternal Grandfather   . Alcohol abuse Maternal  Uncle   . Suicidality Neg Hx      Medication List       Accurate as of 02/21/16  1:56 PM. Always use your most recent med list.          ADDERALL XR 20 MG 24 hr capsule Generic drug:  amphetamine-dextroamphetamine Take 20 mg by mouth daily.   aspirin-acetaminophen-caffeine 250-250-65 MG tablet Commonly known as:  EXCEDRIN MIGRAINE Take 2 tablets by mouth every 6 (six) hours as needed for headache.   fluticasone 50 MCG/ACT nasal spray Commonly known as:  FLONASE Place 2 sprays into both nostrils daily.   HYDROcodone-acetaminophen 5-325 MG tablet Commonly known as:  NORCO/VICODIN Take 1-2 tablets by mouth every 6 (six) hours as needed for moderate pain.   loratadine 10 MG tablet Commonly known as:  CLARITIN Take 1 tablet (10 mg total) by mouth daily.   nitrofurantoin (macrocrystal-monohydrate) 100 MG capsule Commonly known as:  MACROBID Take 1 capsule (100 mg total) by mouth 2 (two) times daily.   ondansetron 8 MG disintegrating tablet Commonly known as:  ZOFRAN ODT Take 1 tablet (8 mg total) by mouth every 8 (eight) hours as needed for nausea or vomiting.       No results found for this or any previous visit (from the past 24 hour(s)). No results found.   ROS: Negative, with the exception of above  mentioned in HPI   Objective:  BP 115/78 (BP Location: Left Arm, Patient Position: Sitting, Cuff Size: Large)   Pulse 77   Temp 98.5 F (36.9 C)   Resp 20   Ht 5\' 6"  (1.676 m)   Wt 167 lb 12 oz (76.1 kg)   SpO2 98%   BMI 27.08 kg/m  Body mass index is 27.08 kg/m. Gen: Afebrile. No acute distress. Nontoxic in appearance, well developed, well nourished.  HENT: AT. [place]. MMM, no oral lesions.  Eyes:Pupils Equal Round Reactive to light, Extraocular movements intact,  Conjunctiva without redness, discharge or icterus. Neck/lymp Chest: CTAB, no wheeze or crackles.  Abd: Soft. NTND. BS present Skin: inner thigh rash, erythema base.  Neuro: Normal gait. PERLA.  EOMi. Alert. Oriented x3  Psych: Normal affect, dress and demeanor. Normal speech. Normal thought content and judgment.  Urinalysis    Component Value Date/Time   COLORURINE YELLOW 01/23/2016 0416   APPEARANCEUR CLOUDY (A) 01/23/2016 0416   LABSPEC <=1.005 02/10/2016 1857   PHURINE 6.5 02/10/2016 1857   GLUCOSEU NEGATIVE 02/10/2016 1857   HGBUR MODERATE (A) 02/10/2016 1857   BILIRUBINUR negative 02/21/2016 1414   KETONESUR NEGATIVE 02/10/2016 1857   PROTEINUR negative 02/21/2016 1414   PROTEINUR NEGATIVE 02/10/2016 1857   UROBILINOGEN 1.0 02/21/2016 1414   UROBILINOGEN 0.2 02/10/2016 1857   NITRITE negative 02/21/2016 1414   NITRITE NEGATIVE 02/10/2016 1857   LEUKOCYTESUR Negative 02/21/2016 1414     Assessment/Plan: [name] is a 25 y.o. female present for acute OV for  Dysuria - POCT Urinalysis Dipstick (Automated)--> normal  - reassured pt UTI is resolved.   Intertrigo - refills on cream placed today.  - Pt encouraged to use clean towels, vinegar soaks etc to help with preventing rash.  - clotrimazole-betamethasone (LOTRISONE) cream; Apply 1 application topically 2 (two) times daily.  Dispense: 45 g; Refill: 1   electronically signed by:  Felix Pacini, DO  [facility] - [place]

## 2016-02-21 NOTE — Patient Instructions (Signed)
Intertrigo Intertrigo is a skin condition that occurs in between folds of skin in places on the body that rub together a lot and do not get much ventilation. It is caused by heat, moisture, friction, sweat retention, and lack of air circulation, which produces red, irritated patches and, sometimes, scaling or drainage. People who have diabetes, who are obese, or who have treatment with antibiotics are at increased risk for intertrigo. The most common sites for intertrigo to occur include:  The groin.  The breasts.  The armpits.  Folds of abdominal skin.  Webbed spaces between the fingers or toes. Intertrigo may be aggravated by:  Sweat.  Feces.  Yeast or bacteria that are present near skin folds.  Urine.  Vaginal discharge. HOME CARE INSTRUCTIONS  The following steps can be taken to reduce friction and keep the affected area cool and dry:  Expose skin folds to the air.  Keep deep skin folds separated with cotton or linen cloth. Avoid tight fitting clothing that could cause chafing.  Wear open-toed shoes or sandals to help reduce moisture between the toes.  Apply absorbent powders to affected areas as directed by your caregiver.  Apply over-the-counter barrier pastes, such as zinc oxide, as directed by your caregiver.  If you develop a fungal infection in the affected area, your caregiver may have you use antifungal creams. SEEK MEDICAL CARE IF:   The rash is not improving after 1 week of treatment.  The rash is getting worse (more red, more swollen, more painful, or spreading).  You have a fever or chills. MAKE SURE YOU:   Understand these instructions.  Will watch your condition.  Will get help right away if you are not doing well or get worse.   This information is not intended to replace advice given to you by your health care provider. Make sure you discuss any questions you have with your health care provider.   Document Released: 04/17/2005 Document  Revised: 07/10/2011 Document Reviewed: 10/19/2014 Elsevier Interactive Patient Education 2016 Elsevier Inc.  

## 2016-02-22 ENCOUNTER — Encounter (HOSPITAL_COMMUNITY): Payer: Self-pay | Admitting: Licensed Clinical Social Worker

## 2016-02-22 NOTE — Progress Notes (Signed)
Daily Group Progress Note Program:  Outpatient  Group Time: 5:00-6:00 pm  Participation Level: Active  Behavioral Response: Appropriate  Type of Therapy:  Psychoeducation/Therapy  Summary of Progress:  Pt participated in a discussion about empowerment in mental wellness by starting and maintaining conversations of hope and desired outcomes and replacing old disempowering conversations with new ones of strengths and wellness. Pt was encouraged to practice conversations based on strengths and wellness. Pt was receptive to suggestions and intervention.  [name]. [name], LCAS 

## 2016-02-23 ENCOUNTER — Other Ambulatory Visit: Payer: Self-pay | Admitting: Family Medicine

## 2016-02-23 ENCOUNTER — Ambulatory Visit (INDEPENDENT_AMBULATORY_CARE_PROVIDER_SITE_OTHER): Payer: BLUE CROSS/BLUE SHIELD | Admitting: Licensed Clinical Social Worker

## 2016-02-23 DIAGNOSIS — F952 Tourette's disorder: Secondary | ICD-10-CM

## 2016-02-24 NOTE — Progress Notes (Signed)
   Daily Group Progress Note  Program: Outpatient   Group Time: 5:30-6:30pm  Participation Level: Active  Behavioral Response: Appropriate  Type of Therapy:  Psychoeducation/Group process  Summary of Progress: Patient presented in timely manner and was engaged in discussion. Patient shared struggles with communication with her husband. Clinician provided psychoeducation on mind reading and healthy communication techniques and encouraged pt to be more verbal with her expectations.  Patient interacted appropriately in the group and accepted feedback.  Patient denies SI/HI or any current immediate needs.    Cindy GuilesJenny Amara Justen, MSW, LCSW, LCAS

## 2016-02-28 ENCOUNTER — Ambulatory Visit (INDEPENDENT_AMBULATORY_CARE_PROVIDER_SITE_OTHER): Payer: BLUE CROSS/BLUE SHIELD | Admitting: Licensed Clinical Social Worker

## 2016-02-28 DIAGNOSIS — F900 Attention-deficit hyperactivity disorder, predominantly inattentive type: Secondary | ICD-10-CM | POA: Diagnosis not present

## 2016-02-28 DIAGNOSIS — F952 Tourette's disorder: Secondary | ICD-10-CM

## 2016-02-29 ENCOUNTER — Encounter (HOSPITAL_COMMUNITY): Payer: Self-pay | Admitting: Licensed Clinical Social Worker

## 2016-02-29 NOTE — Progress Notes (Signed)
Daily Group Progress Note Program:  Outpatient  Group Time: 5:30-6:30 pm  Participation Level: Active  Behavioral Response: Appropriate  Type of Therapy:  Psychoeducation/Therapy  Summary of Progress:  Pt participated in a discussion on the impact mental illness has on levels of self-confidence, while pursuing mental wellness and recovery. Pt was encouraged to find stability or work towards it, which will increase levels of confidence. In learning through self-reflection, pt will discover what she works hardest to achieve will feel the most satisfying and make recovery more easily attainable. Pt was receptive to suggestions and intervention.  Flay Ghosh S. Kyren Vaux, LCAS  

## 2016-03-01 ENCOUNTER — Ambulatory Visit (INDEPENDENT_AMBULATORY_CARE_PROVIDER_SITE_OTHER): Payer: BLUE CROSS/BLUE SHIELD | Admitting: Licensed Clinical Social Worker

## 2016-03-01 DIAGNOSIS — F952 Tourette's disorder: Secondary | ICD-10-CM

## 2016-03-02 ENCOUNTER — Encounter (HOSPITAL_COMMUNITY): Payer: Self-pay | Admitting: Licensed Clinical Social Worker

## 2016-03-02 NOTE — Progress Notes (Signed)
Daily Group Progress Note Program:  Outpatient  Group Time: 5:30-6:30 pm  Participation Level: Active  Behavioral Response: Appropriate  Type of Therapy:  Psychoeducation/Therapy  Summary of Progress:  Pt participated in a discussion on self-awareness = mental wellness, channeling thoughts, emotions and actions. Failure to be aware leads to automatic thoughts, feelings and actions that may be counterproductive to pt's goals.  Pt was encouraged to assess what is right in her life and what she needs to modify to move towards self-awareness through conscious awareness. Pt was receptive to intervention and suggestions.  [name]. [name], LCAS 

## 2016-03-06 ENCOUNTER — Ambulatory Visit (HOSPITAL_COMMUNITY): Payer: Self-pay | Admitting: Licensed Clinical Social Worker

## 2016-03-06 NOTE — Pre-Procedure Instructions (Signed)
Cindy Jones  03/06/2016      [facility] [phone] [place] - [address] [facility] [address] [facility] [address] [place] [address] [place] [id number] Phone: [phone] Fax: [phone]    Your procedure is scheduled on Wed, [name],[name],Aleve,Advil,Motrin,Ibuprofen,[name], [name], powders,perfumes, [name], [name], [name], [name], [name], [name], [name],       ears, mouth and genitals (private parts).  Wash genitals (private parts)  with your normal soap.  6.  Wash thoroughly, [name], Coughing and Deep Breathing and Surgical Site Infection Prevention

## 2016-03-07 ENCOUNTER — Encounter (HOSPITAL_COMMUNITY): Payer: Self-pay

## 2016-03-07 ENCOUNTER — Encounter (HOSPITAL_COMMUNITY)
Admission: RE | Admit: 2016-03-07 | Discharge: 2016-03-07 | Disposition: A | Discharge status: Home / Self Care | Payer: BLUE CROSS/BLUE SHIELD | Source: Ambulatory Visit | Attending: Surgery | Admitting: Surgery

## 2016-03-07 ENCOUNTER — Telehealth (HOSPITAL_COMMUNITY): Payer: Self-pay | Admitting: Licensed Clinical Social Worker

## 2016-03-07 DIAGNOSIS — R1084 Generalized abdominal pain: Secondary | ICD-10-CM | POA: Insufficient documentation

## 2016-03-07 DIAGNOSIS — Z01812 Encounter for preprocedural laboratory examination: Secondary | ICD-10-CM | POA: Insufficient documentation

## 2016-03-07 HISTORY — DX: Unspecified asthma, uncomplicated: J45.909

## 2016-03-07 HISTORY — DX: Anxiety disorder, unspecified: F41.9

## 2016-03-07 HISTORY — DX: Cardiac arrhythmia, unspecified: I49.9

## 2016-03-07 LAB — CBC
HCT: 36.3 % (ref 36.0–46.0)
Hemoglobin: 12.1 g/dL (ref 12.0–15.0)
MCH: 29.6 pg (ref 26.0–34.0)
MCHC: 33.3 g/dL (ref 30.0–36.0)
MCV: 88.8 fL (ref 78.0–100.0)
Platelets: 188 10*3/uL (ref 150–400)
RBC: 4.09 MIL/uL (ref 3.87–5.11)
RDW: 13.5 % (ref 11.5–15.5)
WBC: 5.2 10*3/uL (ref 4.0–10.5)

## 2016-03-07 LAB — BASIC METABOLIC PANEL
Anion gap: 8 (ref 5–15)
BUN: 8 mg/dL (ref 6–20)
CO2: 24 mmol/L (ref 22–32)
Calcium: 9.4 mg/dL (ref 8.9–10.3)
Chloride: 109 mmol/L (ref 101–111)
Creatinine, Ser: 0.82 mg/dL (ref 0.44–1.00)
GFR calc Af Amer: >60 mL/min (ref >60)
GFR calc non Af Amer: >60 mL/min (ref >60)
Glucose, Bld: 95 mg/dL (ref 65–99)
Potassium: 3.9 mmol/L (ref 3.5–5.1)
Sodium: 141 mmol/L (ref 135–145)

## 2016-03-07 LAB — HCG, SERUM, QUALITATIVE: Preg, Serum: NEGATIVE

## 2016-03-07 NOTE — Progress Notes (Signed)
PCP is Dr. Felix Pacinienee Kuneff Denies ever seeing a cardiologist. Denies ever having a stress test, Card cath, or echo. Boost given to pt with instructions to drink at 0700 on the morning of her surgery, voices understanding

## 2016-03-08 ENCOUNTER — Ambulatory Visit (INDEPENDENT_AMBULATORY_CARE_PROVIDER_SITE_OTHER): Payer: BLUE CROSS/BLUE SHIELD | Admitting: Licensed Clinical Social Worker

## 2016-03-08 DIAGNOSIS — F952 Tourette's disorder: Secondary | ICD-10-CM | POA: Diagnosis not present

## 2016-03-08 DIAGNOSIS — F900 Attention-deficit hyperactivity disorder, predominantly inattentive type: Secondary | ICD-10-CM

## 2016-03-09 ENCOUNTER — Encounter (HOSPITAL_COMMUNITY): Payer: Self-pay | Admitting: Licensed Clinical Social Worker

## 2016-03-09 NOTE — Progress Notes (Signed)
Daily Group Progress Note Program:  Outpatient  Group Time: 5:30-6:30 pm  Participation Level: Active  Behavioral Response: Appropriate  Type of Therapy: Psychoeducation/Group process  Summary of Progress: Pt participated in a discussion on stress coping skills in mental health recovery. Pt shared her  Current stress is associated with her upcoming surgery and her work. She has been working on using her boundaries at work which seems to be relieving some of her stress. Normally, the pt's stress will evolve into anxiety. Pt was encouraged to use her wellness tools of relaxation and stress reduction techniques to increase her overall wellness. Pt was receptive to the intervention and suggestions.   Vernona RiegerLisbeth S. [name], LCAS

## 2016-03-14 ENCOUNTER — Telehealth (HOSPITAL_COMMUNITY): Payer: Self-pay | Admitting: Licensed Clinical Social Worker

## 2016-03-14 NOTE — Anesthesia Preprocedure Evaluation (Addendum)
Anesthesia Evaluation  Patient identified by MRN, date of birth, ID band Patient awake    Reviewed: Allergy & Precautions, H&P , Patient's Chart, lab work & pertinent test results, reviewed documented beta blocker date and time   Airway Mallampati: II  TM Distance: >3 FB Neck ROM: full    Dental no notable dental hx.    Pulmonary    Pulmonary exam normal breath sounds clear to auscultation       Cardiovascular Exercise Tolerance: Good  Rhythm:regular Rate:Normal     Neuro/Psych PSYCHIATRIC DISORDERS    GI/Hepatic GERD  ,  Endo/Other    Renal/GU      Musculoskeletal   Abdominal   Peds  Hematology   Anesthesia Other Findings   Reproductive/Obstetrics                            Anesthesia Physical Anesthesia Plan  ASA: II  Anesthesia Plan: General   Post-op Pain Management:    Induction: Intravenous  Airway Management Planned: Oral ETT  Additional Equipment:   Intra-op Plan:   Post-operative Plan: Extubation in OR  Informed Consent: I have reviewed the patients History and Physical, chart, labs and discussed the procedure including the risks, benefits and alternatives for the proposed anesthesia with the patient or authorized representative who has indicated his/her understanding and acceptance.   Dental Advisory Given and Dental advisory given  Plan Discussed with: CRNA and Surgeon  Anesthesia Plan Comments: (  Discussed general anesthesia, including possible nausea, instrumentation of airway, sore throat,pulmonary aspiration, etc. I asked if the were any outstanding questions, or  concerns before we proceeded. )        Anesthesia Quick Evaluation

## 2016-03-15 ENCOUNTER — Ambulatory Visit (HOSPITAL_COMMUNITY): Payer: BLUE CROSS/BLUE SHIELD

## 2016-03-15 ENCOUNTER — Encounter (HOSPITAL_COMMUNITY): Payer: Self-pay | Admitting: Urology

## 2016-03-15 ENCOUNTER — Ambulatory Visit (HOSPITAL_COMMUNITY)
Admission: RE | Admit: 2016-03-15 | Discharge: 2016-03-15 | Disposition: A | Discharge status: Home / Self Care | Payer: BLUE CROSS/BLUE SHIELD | Source: Ambulatory Visit | Attending: Surgery | Admitting: Surgery

## 2016-03-15 ENCOUNTER — Ambulatory Visit (HOSPITAL_COMMUNITY): Payer: BLUE CROSS/BLUE SHIELD | Admitting: Anesthesiology

## 2016-03-15 ENCOUNTER — Encounter (HOSPITAL_COMMUNITY)
Admission: RE | Disposition: A | Discharge status: Home / Self Care | Payer: Self-pay | Source: Ambulatory Visit | Attending: Surgery

## 2016-03-15 DIAGNOSIS — K8064 Calculus of gallbladder and bile duct with chronic cholecystitis without obstruction: Secondary | ICD-10-CM | POA: Diagnosis not present

## 2016-03-15 DIAGNOSIS — F952 Tourette's disorder: Secondary | ICD-10-CM | POA: Insufficient documentation

## 2016-03-15 DIAGNOSIS — K811 Chronic cholecystitis: Secondary | ICD-10-CM | POA: Insufficient documentation

## 2016-03-15 DIAGNOSIS — N390 Urinary tract infection, site not specified: Secondary | ICD-10-CM | POA: Diagnosis not present

## 2016-03-15 DIAGNOSIS — K219 Gastro-esophageal reflux disease without esophagitis: Secondary | ICD-10-CM | POA: Insufficient documentation

## 2016-03-15 DIAGNOSIS — K8010 Calculus of gallbladder with chronic cholecystitis without obstruction: Secondary | ICD-10-CM

## 2016-03-15 DIAGNOSIS — R1011 Right upper quadrant pain: Secondary | ICD-10-CM | POA: Diagnosis not present

## 2016-03-15 DIAGNOSIS — M67439 Ganglion, unspecified wrist: Secondary | ICD-10-CM | POA: Diagnosis not present

## 2016-03-15 DIAGNOSIS — K7689 Other specified diseases of liver: Secondary | ICD-10-CM | POA: Diagnosis not present

## 2016-03-15 DIAGNOSIS — Z419 Encounter for procedure for purposes other than remedying health state, unspecified: Secondary | ICD-10-CM

## 2016-03-15 DIAGNOSIS — F419 Anxiety disorder, unspecified: Secondary | ICD-10-CM | POA: Insufficient documentation

## 2016-03-15 DIAGNOSIS — K8050 Calculus of bile duct without cholangitis or cholecystitis without obstruction: Secondary | ICD-10-CM | POA: Diagnosis not present

## 2016-03-15 HISTORY — PX: LAPAROSCOPIC CHOLECYSTECTOMY SINGLE SITE WITH INTRAOPERATIVE CHOLANGIOGRAM: SHX6538

## 2016-03-15 SURGERY — LAPAROSCOPIC CHOLECYSTECTOMY SINGLE SITE WITH INTRAOPERATIVE CHOLANGIOGRAM
Anesthesia: General | Site: Abdomen

## 2016-03-15 MED ORDER — ACETAMINOPHEN 500 MG PO TABS
1000.0000 mg | ORAL_TABLET | ORAL | Status: AC
  Administered 2016-03-15: 1000 mg via ORAL
  Filled 2016-03-15: qty 2

## 2016-03-15 MED ORDER — MIDAZOLAM HCL 5 MG/5ML IJ SOLN
INTRAMUSCULAR | Status: DC | PRN
  Administered 2016-03-15 (×2): 2 mg via INTRAVENOUS

## 2016-03-15 MED ORDER — ATROPINE SULFATE 1 MG/ML IJ SOLN
INTRAMUSCULAR | Status: AC
  Filled 2016-03-15: qty 2

## 2016-03-15 MED ORDER — BUPIVACAINE HCL (PF) 0.25 % IJ SOLN
INTRAMUSCULAR | Status: AC
  Filled 2016-03-15: qty 30

## 2016-03-15 MED ORDER — IOPAMIDOL (ISOVUE-300) INJECTION 61%
INTRAVENOUS | Status: AC
  Filled 2016-03-15: qty 50

## 2016-03-15 MED ORDER — 0.9 % SODIUM CHLORIDE (POUR BTL) OPTIME
TOPICAL | Status: DC | PRN
Start: 2016-03-15 — End: 2016-03-15
  Administered 2016-03-15: 1000 mL

## 2016-03-15 MED ORDER — DIPHENHYDRAMINE HCL 50 MG/ML IJ SOLN
INTRAMUSCULAR | Status: DC | PRN
  Administered 2016-03-15: 25 mg via INTRAVENOUS

## 2016-03-15 MED ORDER — DEXAMETHASONE SODIUM PHOSPHATE 10 MG/ML IJ SOLN
INTRAMUSCULAR | Status: DC | PRN
  Administered 2016-03-15: 10 mg via INTRAVENOUS

## 2016-03-15 MED ORDER — HYDROCODONE-ACETAMINOPHEN 5-325 MG PO TABS: 1.0000 | ORAL_TABLET | ORAL | 0 refills | Status: DC | PRN

## 2016-03-15 MED ORDER — BUPIVACAINE HCL (PF) 0.25 % IJ SOLN
INTRAMUSCULAR | Status: DC | PRN
  Administered 2016-03-15: 55 mL

## 2016-03-15 MED ORDER — BUPIVACAINE HCL (PF) 0.25 % IJ SOLN
INTRAMUSCULAR | Status: AC
  Filled 2016-03-15: qty 60

## 2016-03-15 MED ORDER — ROCURONIUM BROMIDE 100 MG/10ML IV SOLN
INTRAVENOUS | Status: DC | PRN
  Administered 2016-03-15: 10 mg via INTRAVENOUS
  Administered 2016-03-15: 50 mg via INTRAVENOUS

## 2016-03-15 MED ORDER — HYDROMORPHONE HCL 1 MG/ML IJ SOLN
INTRAMUSCULAR | Status: DC
Start: 2016-03-15 — End: 2016-03-15
  Filled 2016-03-15: qty 0.5

## 2016-03-15 MED ORDER — ROCURONIUM BROMIDE 10 MG/ML (PF) SYRINGE
PREFILLED_SYRINGE | INTRAVENOUS | Status: AC
  Filled 2016-03-15: qty 10

## 2016-03-15 MED ORDER — GABAPENTIN 300 MG PO CAPS
300.0000 mg | ORAL_CAPSULE | ORAL | Status: AC
  Administered 2016-03-15: 300 mg via ORAL
  Filled 2016-03-15: qty 1

## 2016-03-15 MED ORDER — HYDROMORPHONE HCL 1 MG/ML IJ SOLN
INTRAMUSCULAR | Status: AC
  Filled 2016-03-15: qty 0.5

## 2016-03-15 MED ORDER — FENTANYL CITRATE (PF) 100 MCG/2ML IJ SOLN
INTRAMUSCULAR | Status: AC
  Filled 2016-03-15: qty 2

## 2016-03-15 MED ORDER — GLYCOPYRROLATE 0.2 MG/ML IJ SOLN
INTRAMUSCULAR | Status: DC | PRN
  Administered 2016-03-15: 0.2 mg via INTRAVENOUS

## 2016-03-15 MED ORDER — LIDOCAINE 2% (20 MG/ML) 5 ML SYRINGE
INTRAMUSCULAR | Status: AC
  Filled 2016-03-15: qty 10

## 2016-03-15 MED ORDER — ONDANSETRON HCL 4 MG/2ML IJ SOLN
INTRAMUSCULAR | Status: DC | PRN
  Administered 2016-03-15: 4 mg via INTRAVENOUS

## 2016-03-15 MED ORDER — SUCCINYLCHOLINE CHLORIDE 200 MG/10ML IV SOSY
PREFILLED_SYRINGE | INTRAVENOUS | Status: AC
  Filled 2016-03-15: qty 10

## 2016-03-15 MED ORDER — MIDAZOLAM HCL 2 MG/2ML IJ SOLN
INTRAMUSCULAR | Status: AC
  Filled 2016-03-15: qty 4

## 2016-03-15 MED ORDER — PROPOFOL 10 MG/ML IV BOLUS
INTRAVENOUS | Status: DC | PRN
  Administered 2016-03-15: 180 mg via INTRAVENOUS

## 2016-03-15 MED ORDER — LACTATED RINGERS IV SOLN
INTRAVENOUS | Status: DC
  Administered 2016-03-15 (×2): via INTRAVENOUS

## 2016-03-15 MED ORDER — HYDROMORPHONE HCL 1 MG/ML IJ SOLN
0.2500 mg | INTRAMUSCULAR | Status: DC | PRN
  Administered 2016-03-15 (×3): 0.5 mg via INTRAVENOUS

## 2016-03-15 MED ORDER — DEXAMETHASONE SODIUM PHOSPHATE 10 MG/ML IJ SOLN
INTRAMUSCULAR | Status: AC
  Filled 2016-03-15: qty 1

## 2016-03-15 MED ORDER — DIPHENHYDRAMINE HCL 50 MG/ML IJ SOLN
INTRAMUSCULAR | Status: AC
  Filled 2016-03-15: qty 1

## 2016-03-15 MED ORDER — CHLORHEXIDINE GLUCONATE CLOTH 2 % EX PADS: 6.0000 | MEDICATED_PAD | Freq: Once | CUTANEOUS | Status: DC

## 2016-03-15 MED ORDER — SODIUM CHLORIDE 0.9 % IV SOLN
INTRAVENOUS | Status: DC | PRN
  Administered 2016-03-15: 15 mL

## 2016-03-15 MED ORDER — SCOPOLAMINE 1 MG/3DAYS TD PT72
MEDICATED_PATCH | TRANSDERMAL | Status: DC | PRN
  Administered 2016-03-15: 1 via TRANSDERMAL

## 2016-03-15 MED ORDER — SCOPOLAMINE 1 MG/3DAYS TD PT72
MEDICATED_PATCH | TRANSDERMAL | Status: AC
  Filled 2016-03-15: qty 1

## 2016-03-15 MED ORDER — FENTANYL CITRATE (PF) 100 MCG/2ML IJ SOLN
INTRAMUSCULAR | Status: DC | PRN
  Administered 2016-03-15 (×4): 50 ug via INTRAVENOUS

## 2016-03-15 MED ORDER — ONDANSETRON HCL 4 MG/2ML IJ SOLN
INTRAMUSCULAR | Status: AC
  Filled 2016-03-15: qty 2

## 2016-03-15 MED ORDER — SUGAMMADEX SODIUM 200 MG/2ML IV SOLN
INTRAVENOUS | Status: AC
  Filled 2016-03-15: qty 2

## 2016-03-15 MED ORDER — CEFAZOLIN SODIUM 1 G IJ SOLR
INTRAMUSCULAR | Status: AC
  Filled 2016-03-15: qty 40

## 2016-03-15 MED ORDER — SODIUM CHLORIDE 0.9 % IR SOLN
Status: DC | PRN
  Administered 2016-03-15: 1000 mL

## 2016-03-15 MED ORDER — LIDOCAINE HCL (CARDIAC) 20 MG/ML IV SOLN
INTRAVENOUS | Status: DC | PRN
  Administered 2016-03-15: 100 mg via INTRAVENOUS
  Administered 2016-03-15: 50 mg via INTRATRACHEAL

## 2016-03-15 MED ORDER — FENTANYL CITRATE (PF) 100 MCG/2ML IJ SOLN: 25.0000 ug | INTRAMUSCULAR | Status: DC | PRN

## 2016-03-15 MED ORDER — SUGAMMADEX SODIUM 200 MG/2ML IV SOLN
INTRAVENOUS | Status: DC | PRN
  Administered 2016-03-15: 150.2 mg via INTRAVENOUS

## 2016-03-15 SURGICAL SUPPLY — 50 items
APPLIER CLIP 5 13 M/L LIGAMAX5 (MISCELLANEOUS) ×2
APR CLP MED LRG 5 ANG JAW (MISCELLANEOUS) ×1
BAG SPEC RTRVL LRG 6X4 10 (ENDOMECHANICALS)
BLADE SURG ROTATE 9660 (MISCELLANEOUS) IMPLANT
CANISTER SUCTION 2500CC (MISCELLANEOUS) ×2 IMPLANT
CHLORAPREP W/TINT 26ML (MISCELLANEOUS) ×2 IMPLANT
CLIP APPLIE 5 13 M/L LIGAMAX5 (MISCELLANEOUS) ×1 IMPLANT
COVER MAYO STAND STRL (DRAPES) ×2 IMPLANT
COVER SURGICAL LIGHT HANDLE (MISCELLANEOUS) ×2 IMPLANT
DRAPE C-ARM 42X72 X-RAY (DRAPES) ×2 IMPLANT
DRAPE WARM FLUID 44X44 (DRAPE) ×2 IMPLANT
DRSG TEGADERM 4X4.75 (GAUZE/BANDAGES/DRESSINGS) ×2 IMPLANT
DRSG TELFA 3X8 NADH (GAUZE/BANDAGES/DRESSINGS) ×2 IMPLANT
ELECT REM PT RETURN 9FT ADLT (ELECTROSURGICAL) ×2
ELECTRODE REM PT RTRN 9FT ADLT (ELECTROSURGICAL) ×1 IMPLANT
ENDOLOOP SUT PDS II  0 18 (SUTURE)
ENDOLOOP SUT PDS II 0 18 (SUTURE) IMPLANT
GAUZE SPONGE 2X2 8PLY STRL LF (GAUZE/BANDAGES/DRESSINGS) ×1 IMPLANT
GLOVE BIOGEL PI IND STRL 8 (GLOVE) ×1 IMPLANT
GLOVE BIOGEL PI INDICATOR 8 (GLOVE) ×1
GLOVE ECLIPSE 8.0 STRL XLNG CF (GLOVE) ×2 IMPLANT
GOWN STRL REUS W/ TWL LRG LVL3 (GOWN DISPOSABLE) ×2 IMPLANT
GOWN STRL REUS W/ TWL XL LVL3 (GOWN DISPOSABLE) ×1 IMPLANT
GOWN STRL REUS W/TWL LRG LVL3 (GOWN DISPOSABLE) ×4
GOWN STRL REUS W/TWL XL LVL3 (GOWN DISPOSABLE) ×2
KIT BASIN OR (CUSTOM PROCEDURE TRAY) ×2 IMPLANT
KIT ROOM TURNOVER OR (KITS) ×2 IMPLANT
NDL BIOPSY 14X6 SOFT TISS (NEEDLE) IMPLANT
NEEDLE 22X1 1/2 (OR ONLY) (NEEDLE) ×2 IMPLANT
NEEDLE BIOPSY 14X6 SOFT TISS (NEEDLE) ×2 IMPLANT
NS IRRIG 1000ML POUR BTL (IV SOLUTION) ×2 IMPLANT
PAD ARMBOARD 7.5X6 YLW CONV (MISCELLANEOUS) ×4 IMPLANT
PAD DRESSING TELFA 3X8 NADH (GAUZE/BANDAGES/DRESSINGS) IMPLANT
POUCH SPECIMEN RETRIEVAL 10MM (ENDOMECHANICALS) IMPLANT
SCALPEL HARMONIC ACE (MISCELLANEOUS) ×2 IMPLANT
SCISSORS LAP 5X35 DISP (ENDOMECHANICALS) ×2 IMPLANT
SET CHOLANGIOGRAPH 5 50 .035 (SET/KITS/TRAYS/PACK) ×2 IMPLANT
SET IRRIG TUBING LAPAROSCOPIC (IRRIGATION / IRRIGATOR) ×2 IMPLANT
SPECIMEN JAR SMALL (MISCELLANEOUS) ×2 IMPLANT
SPONGE GAUZE 2X2 STER 10/PKG (GAUZE/BANDAGES/DRESSINGS) ×1
SUT MNCRL AB 4-0 PS2 18 (SUTURE) ×2 IMPLANT
SUT PDS AB 1 CT  36 (SUTURE) ×1
SUT PDS AB 1 CT 36 (SUTURE) IMPLANT
SUT PDS AB 1 TP1 96 (SUTURE) ×1 IMPLANT
SUT VICRYL 0 TIES 12 18 (SUTURE) IMPLANT
TOWEL OR 17X26 10 PK STRL BLUE (TOWEL DISPOSABLE) ×2 IMPLANT
TRAY LAPAROSCOPIC MC (CUSTOM PROCEDURE TRAY) ×2 IMPLANT
TROCAR 5M 150ML BLDLS (TROCAR) ×2 IMPLANT
TROCAR XCEL NON-BLD 5MMX100MML (ENDOMECHANICALS) ×1 IMPLANT
TUBING INSUFFLATION (TUBING) ×2 IMPLANT

## 2016-03-15 NOTE — H&P (Addendum)
Cindy Jones 01/31/2016 2:23 PM Location: [facility] Patient #: [id number] DOB: 03-15-1991 Married / Language: [place] / Race: White Female  Patient Care Team: Natalia Leatherwoodenee A Kuneff, DO as PCP - General (Family Medicine) Karie SodaSteven Dominie Benedick, MD as Consulting Physician (General Surgery) Ruffin FrederickSteven Paul Armbruster, MD as Consulting Physician (Gastroenterology)   History of Present Illness  The patient is a 25 year old female who presents with non-malignant abdominal pain. Note for "Non-malignant abdominal pain": Patient sent for surgical consultation for abdominal pain and gallstones. In the past rather well controlled. Dr. Jorene MinorsKevin Stienl. [place]  25 year old female history of Tourette's and eating disorder. Goes by Cindy Jones as well. (challenge to get all of her IDs changed) She had intermittent abdominal pains for the past 6 months. She describes it usually triggered by eating. Bad attack after ice cream. Another after having a hamburger. Had a good emergency room. Felt epigastric pain. Mainly in the upper abdomen on both sides. Sometimes worse on the right. Would feel nauseated. Some retching. Saw primary care physician. No improvement on proton pump inhibitors. Went to the emergency room. Given her history of bulimia they thought maybe she had a hiatal hernia since her some question of dysphasia. Sent to gastroenterology. EGD Endoscopy performed. No hiatal hernia. No gastritis. 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 Dr. Adela LankArmbruster was wondering about repeating assayed to see if that has changed at all since he thought he could see spleen tip pushing onto her distended upper stomach by EGD She denies any left-sided pain really. She does not smoke.  No personal nor family history of GI/colon cancer, inflammatory bowel disease, irritable bowel syndrome, allergy such as Celiac Sprue,  dietary/dairy problems, colitis, ulcers nor gastritis. No recent sick contacts/gastroenteritis. No travel outside the country. No changes in diet. No dysphagia to solids or liquids. No significant heartburn or reflux. No hematochezia, hematemesis, coffee ground emesis. No evidence of prior gastric/peptic ulceration.  No new events.    Other Problems Gilmer Mor(Cindy Jones, CMA; 01/31/2016 2:23 PM) Anxiety Disorder Cholelithiasis Heart murmur  Past Surgical History Gilmer Mor(Cindy Jones, CMA; 01/31/2016 2:23 PM) Oral Surgery Tonsillectomy  Diagnostic Studies History Gilmer Mor(Cindy Jones, CMA; 01/31/2016 2:23 PM) Colonoscopy never Mammogram never Pap Smear 1-5 years ago  Allergies Gilmer Mor(Cindy Jones, CMA; 01/31/2016 2:24 PM) Sulfa Antibiotics Latex Exam Gloves *MEDICAL DEVICES AND SUPPLIES* Naproxen *ANALGESICS - ANTI-INFLAMMATORY*  Medication History (Cindy Jones, CMA; 01/31/2016 2:24 PM) Adderall XR (25MG  Capsule ER 24HR, Oral) Active. Fluticasone Propionate (50MCG/ACT Suspension, Nasal as needed) Active. Medications Reconciled  Social History Gilmer Mor(Cindy Jones, CMA; 01/31/2016 2:23 PM) Alcohol use Occasional alcohol use. Caffeine use Coffee. No drug use Tobacco use Never smoker.  Family History Gilmer Mor(Cindy Jones, CMA; 01/31/2016 2:23 PM) Alcohol Abuse Mother. Cancer Father. Depression Mother.  Pregnancy / Birth History Gilmer Mor(Cindy Jones, CMA; 01/31/2016 2:23 PM) Age at menarche 13 years. Gravida 0 Para 0 Regular periods    Review of Systems Lamar Laundry(Cindy Jones CMA; 01/31/2016 2:23 PM) General Present- Appetite Loss, Fatigue and Weight Loss. Not Present- Chills, Fever, Night Sweats and Weight Gain. Skin Present- Dryness. Not Present- Change in Wart/Mole, Hives, Jaundice, New Lesions, Non-Healing Wounds, Rash and Ulcer. HEENT Present- Wears glasses/contact lenses. Not Present- Earache, Hearing Loss, Hoarseness, Nose Bleed, Oral Ulcers, Ringing in the Ears, Seasonal Allergies, Sinus Pain, Sore  Throat, Visual Disturbances and Yellow Eyes. Cardiovascular Present- Chest Pain, Difficulty Breathing Lying Down, Shortness of Breath  and Swelling of Extremities. Not Present- Leg Cramps, Palpitations and Rapid Heart Rate. Gastrointestinal Present- Abdominal Pain, Bloating, Constipation and Indigestion. Not Present- Bloody Stool, Change in Bowel Habits, Chronic diarrhea, Difficulty Swallowing, Excessive gas, Gets full quickly at meals, Hemorrhoids, Nausea, Rectal Pain and Vomiting. Female Genitourinary Present- Frequency and Pelvic Pain. Not Present- Nocturia, Painful Urination and Urgency. Musculoskeletal Present- Back Pain. Not Present- Joint Pain, Joint Stiffness, Muscle Pain, Muscle Weakness and Swelling of Extremities. Neurological Present- Headaches. Not Present- Decreased Memory, Fainting, Numbness, Seizures, Tingling, Tremor, Trouble walking and Weakness. Psychiatric Present- Anxiety. Not Present- Bipolar, Change in Sleep Pattern, Depression, Fearful and Frequent crying. Endocrine Not Present- Cold Intolerance, Excessive Hunger, Hair Changes, Heat Intolerance, Hot flashes and New Diabetes. Hematology Present- Gland problems. Not Present- Blood Thinners, Easy Bruising, Excessive bleeding, HIV and Persistent Infections.  Vitals (Cindy Jones CMA; 01/31/2016 2:24 PM) 01/31/2016 2:23 PM Weight: 168 lb Height: 65in Body Surface Area: 1.84 m Body Mass Index: 27.96 kg/m  Temp.: 98.68F(Temporal)  Pulse: 81 (Regular)  BP: 122/76 (Sitting, Left Arm, Standard)       Physical Exam Ardeth Sportsman MD; 01/31/2016 3:16 PM) General Mental Status-Alert. General Appearance-Not in acute distress, Not Sickly. Orientation-Oriented X3. Hydration-Well hydrated. Voice-Normal.  Integumentary Global Assessment Upon inspection and palpation of skin surfaces of the - Axillae: non-tender, no inflammation or ulceration, no drainage. and Distribution of scalp and body hair is  normal. General Characteristics Temperature - normal warmth is noted.  Head and Neck Head-normocephalic, atraumatic with no lesions or palpable masses. Face Global Assessment - atraumatic, no absence of expression. Neck Global Assessment - no abnormal movements, no bruit auscultated on the right, no bruit auscultated on the left, no decreased range of motion, non-tender. Trachea-midline. Thyroid Gland Characteristics - non-tender.  Eye Eyeball - Left-Extraocular movements intact, No Nystagmus. Eyeball - Right-Extraocular movements intact, No Nystagmus. Cornea - Left-No Hazy. Cornea - Right-No Hazy. Sclera/Conjunctiva - Left-No scleral icterus, No Discharge. Sclera/Conjunctiva - Right-No scleral icterus, No Discharge. Pupil - Left-Direct reaction to light normal. Pupil - Right-Direct reaction to light normal.  ENMT Ears Pinna - Left - no drainage observed, no generalized tenderness observed. Right - no drainage observed, no generalized tenderness observed. Nose and Sinuses External Inspection of the Nose - no destructive lesion observed. Inspection of the nares - Left - quiet respiration. Right - quiet respiration. Mouth and Throat Lips - Upper Lip - no fissures observed, no pallor noted. Lower Lip - no fissures observed, no pallor noted. Nasopharynx - no discharge present. Oral Cavity/Oropharynx - Tongue - no dryness observed. Oral Mucosa - no cyanosis observed. Hypopharynx - no evidence of airway distress observed.  Chest and Lung Exam Inspection Movements - Normal and Symmetrical. Accessory muscles - No use of accessory muscles in breathing. Palpation Palpation of the chest reveals - Non-tender. Auscultation Breath sounds - Normal and Clear.  Cardiovascular Auscultation Rhythm - Regular. Murmurs & Other Heart Sounds - Auscultation of the heart reveals - No Murmurs and No Systolic Clicks.  Abdomen Inspection Inspection of the abdomen reveals - No  Visible peristalsis and No Abnormal pulsations. Umbilicus - No Bleeding, No Urine drainage. Palpation/Percussion Palpation and Percussion of the abdomen reveal - Soft, Non Tender, No Rebound tenderness, No Rigidity (guarding) and No Cutaneous hyperesthesia. Note: Mild epigastric and right upper quadrant discomfort. No true Eulah Pont sign now. The rest the abdomen is soft and nontender. Nndistended. No guarding. No umbilical no other hernias. No diastases recti   Female Genitourinary Sexual Maturity Tanner 5 -  Adult hair pattern. Note: No vaginal bleeding nor discharge   Peripheral Vascular Upper Extremity Inspection - Left - No Cyanotic nailbeds, Not Ischemic. Right - No Cyanotic nailbeds, Not Ischemic.  Neurologic Neurologic evaluation reveals -normal attention span and ability to concentrate, able to name objects and repeat phrases. Appropriate fund of knowledge , normal sensation and normal coordination. Mental Status Affect - not angry, not paranoid. Cranial Nerves-Normal Bilaterally. Gait-Normal.  Neuropsychiatric Mental status exam performed with findings of-able to articulate well with normal speech/language, rate, volume and coherence, thought content normal with ability to perform basic computations and apply abstract reasoning and no evidence of hallucinations, delusions, obsessions or homicidal/suicidal ideation.  Musculoskeletal Global Assessment Spine, Ribs and Pelvis - no instability, subluxation or laxity. Right Upper Extremity - no instability, subluxation or laxity.  Lymphatic Head & Neck  General Head & Neck Lymphatics: Bilateral - Description - No Localized lymphadenopathy. Axillary  General Axillary Region: Bilateral - Description - No Localized lymphadenopathy. Femoral & Inguinal  Generalized Femoral & Inguinal Lymphatics: Left - Description - No Localized lymphadenopathy. Right - Description - No Localized lymphadenopathy.    Assessment &  Plan CHRONIC CHOLECYSTITIS WITH CALCULUS (K80.10) Impression: Rather classic history and physical for symptomatic gallstones. Now with chronic soreness suggestive of chronic cholecystitis.  The fact that she's had a negative endoscopy and no improvement on proton pump inhibitors more strongly argues towards gallbladder is the culprit.  I think she would benefit from cholecystectomy. Reasonable single site approach. She would like to get this done sooner versus later to avoid any future attacks. Current Plans You are being scheduled for surgery - Our schedulers will call you.  You should hear from our office's scheduling department within 5 working days about the location, date, and time of surgery. We try to make accommodations for patient's preferences in scheduling surgery, but sometimes the OR schedule or the surgeon's schedule prevents us from making those accommodations.  If you have not heard from our office [phone]) in 5 working days, call the office and ask for your surgeon's nurse.  If you have other questions about your diagnosis, plan, or surgery, call the office and ask for your surgeon's nurse.  The anatomy & physiology of hepatobiliary & pancreatic function was discussed. The pathophysiology of gallbladder dysfunction was discussed. Natural history risks without surgery was discussed. I feel the risks of no intervention will lead to serious problems that outweigh the operative risks; therefore, I recommended cholecystectomy to remove the pathology. I explained laparoscopic techniques with possible need for an open approach. Probable cholangiogram to evaluate the bilary tract was explained as well.  Risks such as bleeding, infection, abscess, leak, injury to other organs, need for further treatment, heart attack, death, and other risks were discussed. I noted a good likelihood this will help address the problem. Possibility that this will not correct all abdominal symptoms was  explained. Goals of post-operative recovery were discussed as well. We will work to minimize complications. An educational handout further explaining the pathology and treatment options was given as well. Questions were answered. The patient expresses understanding & wishes to proceed with surgery.  Pt Education - Pamphlet Given - Laparoscopic Gallbladder Surgery: discussed with patient and provided information. Written instructions provided Pt Education - [facility] Laparosopic Post Op HCI (Emanual Lamountain) Pt Education - [facility] Good Bowel Health (Jazari Ober) Pt Education - Laparoscopic Cholecystectomy: gallbladder  Ardeth SportsmanSteven C. Roselia Snipe, M.D., F.A.C.S. Gastrointestinal and Minimally Invasive Surgery [facility] [address] [place],  [place] [address] [phone] Main / Paging

## 2016-03-15 NOTE — Discharge Instructions (Signed)
LAPAROSCOPIC SURGERY: POST OP INSTRUCTIONS ° °###################################################################### ° °EAT °Gradually transition to a high fiber diet with a fiber supplement over the next few weeks after discharge.  Start with a pureed / full liquid diet (see below) ° °WALK °Walk an hour a day.  Control your pain to do that.   ° °CONTROL PAIN °Control pain so that you can walk, sleep, tolerate sneezing/coughing, go up/down stairs. ° °HAVE A BOWEL MOVEMENT DAILY °Keep your bowels regular to avoid problems.  OK to try a laxative to override constipation.  OK to use an antidairrheal to slow down diarrhea.  Call if not better after 2 tries ° °CALL IF YOU HAVE PROBLEMS/CONCERNS °Call if you are still struggling despite following these instructions. °Call if you have concerns not answered by these instructions ° °###################################################################### ° ° ° °1. DIET: Follow a light bland diet the first 24 hours after arrival home, such as soup, liquids, crackers, etc.  Be sure to include lots of fluids daily.  Avoid fast food or heavy meals as your are more likely to get nauseated.  Eat a low fat the next few days after surgery.   °2. Take your usually prescribed home medications unless otherwise directed. °3. PAIN CONTROL: °a. Pain is best controlled by a usual combination of three different methods TOGETHER: °i. Ice/Heat °ii. Over the counter pain medication °iii. Prescription pain medication °b. Most patients will experience some swelling and bruising around the incisions.  Ice packs or heating pads (30-60 minutes up to 6 times a day) will help. Use ice for the first few days to help decrease swelling and bruising, then switch to heat to help relax tight/sore spots and speed recovery.  Some people prefer to use ice alone, heat alone, alternating between ice & heat.  Experiment to what works for you.  Swelling and bruising can take several weeks to resolve.   °c. It is  helpful to take an over-the-counter pain medication regularly for the first few weeks.  Choose one of the following that works best for you: °i. Naproxen (Aleve, etc)  Two 220mg tabs twice a day °ii. Ibuprofen (Advil, etc) Three 200mg tabs four times a day (every meal & bedtime) °iii. Acetaminophen (Tylenol, etc) 500-650mg four times a day (every meal & bedtime) °d. A  prescription for pain medication (such as oxycodone, hydrocodone, etc) should be given to you upon discharge.  Take your pain medication as prescribed.  °i. If you are having problems/concerns with the prescription medicine (does not control pain, nausea, vomiting, rash, itching, etc), please call us [phone] to see if we need to switch you to a different pain medicine that will work better for you and/or control your side effect better. °ii. If you need a refill on your pain medication, please contact your pharmacy.  They will contact our office to request authorization. Prescriptions will not be filled after 5 pm or on week-ends. °4. Avoid getting constipated.  Between the surgery and the pain medications, it is common to experience some constipation.  Increasing fluid intake and taking a fiber supplement (such as Metamucil, Citrucel, FiberCon, MiraLax, etc) 1-2 times a day regularly will usually help prevent this problem from occurring.  A mild laxative (prune juice, Milk of Magnesia, MiraLax, etc) should be taken according to package directions if there are no bowel movements after 48 hours.   °5. Watch out for diarrhea.  If you have many loose bowel movements, simplify your diet to bland foods & liquids for   a few days.  Stop any stool softeners and decrease your fiber supplement.  Switching to mild anti-diarrheal medications (Kayopectate, Pepto Bismol) can help.  If this worsens or does not improve, please call us. °6. Wash / shower every day.  You may shower over the dressings as they are waterproof.  Continue to shower over incision(s)  after the dressing is off. °7. Remove your waterproof bandages 5 days after surgery.  You may leave the incision open to air.  You may replace a dressing/Band-Aid to cover the incision for comfort if you wish.  °8. ACTIVITIES as tolerated:   °a. You may resume regular (light) daily activities beginning the next day--such as daily self-care, walking, climbing stairs--gradually increasing activities as tolerated.  If you can walk 30 minutes without difficulty, it is safe to try more intense activity such as jogging, treadmill, bicycling, low-impact aerobics, swimming, etc. °b. Save the most intensive and strenuous activity for last such as sit-ups, heavy lifting, contact sports, etc  Refrain from any heavy lifting or straining until you are off narcotics for pain control.   °c. DO NOT PUSH THROUGH PAIN.  Let pain be your guide: If it hurts to do something, don't do it.  Pain is your body warning you to avoid that activity for another week until the pain goes down. °d. You may drive when you are no longer taking prescription pain medication, you can comfortably wear a seatbelt, and you can safely maneuver your car and apply brakes. °e. You may have sexual intercourse when it is comfortable.  °9. FOLLOW UP in our office °a. Please call [facility] at [phone] to set up an appointment to see your surgeon in the office for a follow-up appointment approximately 2-3 weeks after your surgery. °b. Make sure that you call for this appointment the day you arrive home to insure a convenient appointment time. °10. IF YOU HAVE DISABILITY OR FAMILY LEAVE FORMS, BRING THEM TO THE OFFICE FOR PROCESSING.  DO NOT GIVE THEM TO YOUR DOCTOR. ° ° °WHEN TO CALL US [phone]: °1. Poor pain control °2. Reactions / problems with new medications (rash/itching, nausea, etc)  °3. Fever over 101.5 F (38.5 C) °4. Inability to urinate °5. Nausea and/or vomiting °6. Worsening swelling or bruising °7. Continued bleeding from incision. °8. Increased  pain, redness, or drainage from the incision ° ° The clinic staff is available to answer your questions during regular business hours (8:30am-5pm).  Please don’t hesitate to call and ask to speak to one of our nurses for clinical concerns.  ° If you have a medical emergency, go to the nearest emergency room or call 911. ° A surgeon from [facility] is always on call at the hospitals ° ° °[facility] °[address], [address], [place], [place]  [address] ? °MAIN: [phone] ? TOLL FREE: [phone] ?  °FAX [phone] °[email or web address] ° ° °

## 2016-03-15 NOTE — Op Note (Signed)
03/15/2016  1:00 PM  PATIENT:  Cindy Jones  25 y.o. female  Patient Care Team: Renee A Kuneff, DO as PCP - General (Family Medicine) Latise Dilley, MD as Consulting Physician (General Surgery) Raymondo Garcialopez Paul Armbruster, MD as Consulting Physician (Gastroenterology)  PRE-OPERATIVE DIAGNOSIS:  SYMPTOMATIC BILIARY COLIC, PROBABLE CHRONIC CHOLECYSTITIS  POST-OPERATIVE DIAGNOSIS:    SYMPTOMATIC BILIARY COLIC, PROBABLE CHRONIC CHOLECYSTITIS LIVER CHANGES ?FATTY CHANGE  PROCEDURE:    LAPAROSCOPIC CHOLECYSTECTOMY SINGLE SITE WITH INTRAOPERATIVE CHOLANGIOGRAM CORE LIVER BIOPSY X3  SURGEON:  Surgeon(s): Krystn Dermody, MD  ASSISTANT: RN    ANESTHESIA:   local and general  EBL:  Total I/O In: 1000 [I.V.:1000] Out: -   Delay start of Pharmacological VTE agent (>24hrs) due to surgical blood loss or risk of bleeding:  no  DRAINS: none   SPECIMEN:  Source of Specimen:  Gallbladder   DISPOSITION OF SPECIMEN:  PATHOLOGY  COUNTS:  YES  PLAN OF CARE: Discharge to home after PACU  PATIENT DISPOSITION:  PACU - hemodynamically stable.  INDICATION: Patient with nausea & abdominal pain with gallstones.  Rest of GI workup negative.  I offered cholecystectomy:   The anatomy & physiology of hepatobiliary & pancreatic function was discussed.  The pathophysiology of gallbladder dysfunction was discussed.  Natural history risks without surgery was discussed.   I feel the risks of no intervention will lead to serious problems that outweigh the operative risks; therefore, I recommended cholecystectomy to remove the pathology.  I explained laparoscopic techniques with possible need for an open approach.  Probable cholangiogram to evaluate the bilary tract was explained as well.    Risks such as bleeding, infection, abscess, leak, injury to other organs, need for further treatment, heart attack, death, and other risks were discussed.  I noted a good likelihood this will help address the problem.   Possibility that this will not correct all abdominal symptoms was explained.  Goals of post-operative recovery were discussed as well.  We will work to minimize complications.  An educational handout further explaining the pathology and treatment options was given as well.  Questions were answered.  The patient expresses understanding & wishes to proceed with surgery.  OR FINDINGS: Gallbladder with adhesions and gallbladder wall thickening consistent with chronic cholecystitis.  Narrow biliary system but no obstruction.  Some mild fatty change of the liver suspicious for steatohepatitis.  Core liver biopsies done  DESCRIPTION:   The patient was identified & brought in the operating room. The patient was positioned supine with arms tucked. SCDs were active during the entire case. The patient underwent general anesthesia without any difficulty.  The abdomen was prepped and draped in a sterile fashion. A Surgical Timeout confirmed our plan.  I made a transverse curvilinear incision through the superior umbilical fold.  I placed [facility]90[facility]5-[address]AnnMain Line SuArJ(628[facility]) 9 Wr40AnnLanai CoArJoFranciscan 93[facility]8-7062 Eu40AnnSouthwest GenerArJ(22[facility]0)[address]40AnnALPinArJoOsawatomie State (93[facility]7)1 A40AnnGrand View Surgery CenteArJoTristar Ho(73[facility]7)[address]40AnnDiscover Vision Surgery And ArJoBa87[facility]3-31 Ever40AnnMitchell County HospitaArJoColer-[facility]water [facility] -50[facility]2-8649 Tr40AnnRiversidArJoWyomi78[facility]1-9726 Wak40AnnSan ArJoGr81[facility]9-7057 South Ber40AnnWenatche[facility] HealtArJoRidge40[facility]3-7072 Roc40AnnProcedure ArJoOutpatient Surgica23[facility]4-[address]40AnnEye Care And Surgery Center Of FArJoGuam R31[facility]9-876040AnnAdventisANoland Hospital Tuscaloosa, LLCAJoHoust(702[facility]) 36 Eve40AnnGastroenterologArJoShriners' H75[phone] Welli40AnnVa Boston Healthcare SystemArJoFirst S94[facility]1-2 E. Meado40AnArvinMeritorospitals LPthe supraumbilical fascia using a modified Hassan cutdown technique. I began carbon dioxide insufflation. Camera inspection revealed no injury. There were no adhesions to the anterior abdominal wall supraumbilically.  I proceeded to continue with single site technique. I placed a #5 port in left upper aspect of the wound. I placed a 5 mm atraumatic grasper in the right inferior aspect of the wound.  I turned attention to the right upper quadrant.  The gallbladder fundus was elevated cephalad.  I freed greater omentum and duodenal adhesions off the gallbladder.  I freed the peritoneal coverings between the gallbladder and the liver on the posteriolateral and anteriomedial walls. I alternated between Harmonic & blunt [place]  dissection to help get a good critical view of the cystic artery and cystic duct. I did further dissection to free a few centimeters of  the  gallbladder off the liver bed to get a good critical view of the infundibulum and cystic duct. I mobilized the cystic artery; and, after getting a good 360 view, ligated the cystic artery using the Harmonic ultrasonic dissection. I skeletonized the cystic duct.  I placed a clip on the infundibulum. I did a partial cystic duct-otomy and ensured patency. I placed a 5 JamaicaFrench cholangiocatheter through a puncture site at the right subcostal ridge of the abdominal wall and directed it into the cystic duct.  We ran a cholangiogram with dilute radio-opaque contrast and continuous fluoroscopy.  Contrast flowed from a side branch consistent with cystic duct cannulization. Contrast flowed up the common hepatic duct into the right and left intrahepatic chains out to secondary radicals. Contrast flowed down the common bile duct easily across the normal ampulla into the duodenum.  There is a moderate leaking at the entry catheter of the cystic duct but no or else.  This was consistent with a normal cholangiogram.  I removed the cholangiocatheter. I placed clips on the cystic duct x4.  I completed cystic duct transection. I freed the gallbladder from its remaining attachments to the liver.  She seen have some fatty change and whether a little atypical appearance.  Therefore did 3 core biopsies 3 passes through the anterior right hepatic lobe using a 14-gauge TruCut needle. I ensured hemostasis on the gallbladder fossa of the liver and elsewhere. I inspected the rest of the abdomen & detected no injury nor bleeding elsewhere.    I removed the gallbladder out the supraumbilical fascia. I closed the fascia transversely using #1 PDS interrupted stitches. I closed the skin using 4-0 monocryl stitch.  Sterile dressing was applied. The patient was extubated & arrived in the PACU in stable condition..  I had discussed postoperative care with the patient, her husband, and close friend in the holding area. I discussed  operative findings, updated the patient's status, discussed probable steps to recovery, and gave postoperative recommendations to the patient's family.  Recommendations were made.  Questions were answered.  They expressed understanding & appreciation.   Instructions are written in the chart as well.  Ardeth SportsmanSteven C. Ryen Rhames, M.D., F.A.C.S. Gastrointestinal and Minimally Invasive Surgery [facility] [address] [place], [place] [id number] [phone] Main / Paging

## 2016-03-15 NOTE — Interval H&P Note (Signed)
History and Physical Interval Note:  03/15/2016 11:00 AM  Cindy Jones  has presented today for surgery, with the diagnosis of SYMPTOMATIC BILIARY COLIC, PROBABLE CHRONIC CHOLECYSTITIS  The various methods of treatment have been discussed with the patient and family. After consideration of risks, benefits and other options for treatment, the patient has consented to  Procedure(s): LAPAROSCOPIC CHOLECYSTECTOMY SINGLE SITE WITH INTRAOPERATIVE CHOLANGIOGRAM (N/A) as a surgical intervention .  The patient's history has been reviewed, patient examined, no change in status, stable for surgery.  I have reviewed the patient's chart and labs.  Questions were answered to the patient's satisfaction.     [name]

## 2016-03-15 NOTE — Anesthesia Postprocedure Evaluation (Signed)
Anesthesia Post Note  Patient: Maryjean Kamily H Lasala  Procedure(s) Performed: Procedure(s) (LRB): LAPAROSCOPIC CHOLECYSTECTOMY SINGLE SITE WITH INTRAOPERATIVE CHOLANGIOGRAM (N/A)  Patient location during evaluation: PACU Anesthesia Type: General Level of consciousness: sedated Pain management: satisfactory to patient Vital Signs Assessment: post-procedure vital signs reviewed and stable Respiratory status: spontaneous breathing Cardiovascular status: stable Anesthetic complications: no    Last Vitals:  Vitals:   03/15/16 1013 03/15/16 1319  BP: 114/78   Pulse: (!) 59   Resp: 18   Temp: 36.8 C 36.6 C    Last Pain:  Vitals:   03/15/16 1426  TempSrc:   PainSc: 10-Worst pain ever                 PepsiCoJACKSON,Teddrick Mallari EDWARD

## 2016-03-15 NOTE — Transfer of Care (Signed)
Immediate Anesthesia Transfer of Care Note  Patient: Cindy Jones  Procedure(s) Performed: Procedure(s): LAPAROSCOPIC CHOLECYSTECTOMY SINGLE SITE WITH INTRAOPERATIVE CHOLANGIOGRAM (N/A)  Patient Location: PACU  Anesthesia Type:General  Level of Consciousness: sedated  Airway & Oxygen Therapy: Patient Spontanous Breathing and Patient connected to nasal cannula oxygen  Post-op Assessment: Report given to RN and Post -op Vital signs reviewed and stable  Post vital signs: Reviewed and stable  Last Vitals:  Vitals:   03/15/16 1013 03/15/16 1319  BP: 114/78   Pulse: (!) 59   Resp: 18   Temp: 36.8 C (P) 36.6 C    Last Pain:  Vitals:   03/15/16 1319  TempSrc:   PainSc: (P) Asleep         Complications: No apparent anesthesia complications

## 2016-03-15 NOTE — Anesthesia Procedure Notes (Signed)
Procedure Name: Intubation Date/Time: 03/15/2016 11:35 AM Performed by: Valda Favia Pre-anesthesia Checklist: Patient identified, Emergency Drugs available, Suction available, Patient being monitored and Timeout performed Patient Re-evaluated:Patient Re-evaluated prior to inductionOxygen Delivery Method: Circle system utilized Preoxygenation: Pre-oxygenation with 100% oxygen Intubation Type: IV induction Ventilation: Mask ventilation without difficulty Laryngoscope Size: Mac, 4 and Glidescope Grade View: Grade II Tube type: Oral Tube size: 7.0 mm Number of attempts: 2 Airway Equipment and Method: Stylet,  Video-laryngoscopy and LTA kit utilized Placement Confirmation: ETT inserted through vocal cords under direct vision,  positive ETCO2 and breath sounds checked- equal and bilateral Secured at: 22 cm Tube secured with: Tape Dental Injury: Teeth and Oropharynx as per pre-operative assessment  Comments: DLx1 Grade II view by A. Domnique Vanegas, CRNA limited opening difficult to pass ETT due to anterior larynx, resume mask ventilatiion glidescope utilized for DLx 2 by A. Jams Trickett CRNA  Grade II view successful placement of OETT. Bilateral breath sounds present and positive end tidal CO2

## 2016-03-16 ENCOUNTER — Encounter (HOSPITAL_COMMUNITY): Payer: Self-pay | Admitting: Surgery

## 2016-03-18 ENCOUNTER — Telehealth: Payer: Self-pay | Admitting: General Surgery

## 2016-03-18 NOTE — Telephone Encounter (Signed)
Discussed bloating with patient/fluid build up.  Pt denies nausea/vomiting/fever/chills/jaundice.  Urine is clear/light yellow.  Drinking water.  Just had first BM today after surgery Wednesday.  She is a "little more sore this AM since I didn't take the pain medication."  Husband is Charity fundraiserN.    Discussed starting stool softeners, minimizing narcotic by adding ibuprofen/tylenol, continuing ambulation.    Advised to call/come to ED for fever/jaundice/dramatic change in abdominal pain or nausea/vomiting.  Pt reassured.

## 2016-03-20 ENCOUNTER — Ambulatory Visit (INDEPENDENT_AMBULATORY_CARE_PROVIDER_SITE_OTHER): Payer: BLUE CROSS/BLUE SHIELD | Admitting: Licensed Clinical Social Worker

## 2016-03-20 DIAGNOSIS — F900 Attention-deficit hyperactivity disorder, predominantly inattentive type: Secondary | ICD-10-CM | POA: Diagnosis not present

## 2016-03-20 DIAGNOSIS — F952 Tourette's disorder: Secondary | ICD-10-CM | POA: Diagnosis not present

## 2016-03-21 ENCOUNTER — Encounter (HOSPITAL_COMMUNITY): Payer: Self-pay | Admitting: Licensed Clinical Social Worker

## 2016-03-21 NOTE — Progress Notes (Signed)
Daily Group Progress Note Program:  Outpatient  Group Time: 5:30-6:30 pm  Participation Level: Active  Behavioral Response: Appropriate  Type of Therapy:  Psychoeducation/Therapy  Summary of Progress: Pt participated in a discussion on relationships in mental health recovery. Pt completed an activity on "The Relationship Circle": intimate, close, and acquaintances. A discussion ensued on the placement in the circle of people allowed in her life. The discussion continued where patient showed how some people move around in different circles so that she may protect herself. Pt was encouraged to continue to use boundaries with people in her life for healthy self-care.  [name]. [name], LCAS  

## 2016-03-22 ENCOUNTER — Ambulatory Visit (HOSPITAL_COMMUNITY): Payer: BLUE CROSS/BLUE SHIELD | Admitting: Licensed Clinical Social Worker

## 2016-03-22 ENCOUNTER — Encounter (HOSPITAL_COMMUNITY): Payer: Self-pay

## 2016-03-27 ENCOUNTER — Ambulatory Visit (HOSPITAL_COMMUNITY): Payer: Self-pay | Admitting: Licensed Clinical Social Worker

## 2016-03-29 ENCOUNTER — Ambulatory Visit (INDEPENDENT_AMBULATORY_CARE_PROVIDER_SITE_OTHER): Payer: BLUE CROSS/BLUE SHIELD | Admitting: Licensed Clinical Social Worker

## 2016-03-29 DIAGNOSIS — F952 Tourette's disorder: Secondary | ICD-10-CM

## 2016-03-29 DIAGNOSIS — F900 Attention-deficit hyperactivity disorder, predominantly inattentive type: Secondary | ICD-10-CM | POA: Diagnosis not present

## 2016-03-30 ENCOUNTER — Encounter (HOSPITAL_COMMUNITY): Payer: Self-pay | Admitting: Licensed Clinical Social Worker

## 2016-03-30 NOTE — Progress Notes (Signed)
Daily Group Progress Note Program:  Outpatient  Group Time: 5:30-6:30 pm  Participation Level: Active  Behavioral Response: Appropriate  Type of Therapy:  Psychoeducation/Therapy  Summary of Progress: Pt participated in an art activity led by one of the group members who is an art therapist. Using art to help in the recovery process of mental illness can be effective in exploring and expressing feelings and improving overall well-being. One art activity concentrated on expressing emotions; the other art activity depicted: "How do I see myself?' Pt was encouraged to continue expressing her emotions in a healthy manner and continue to create positive images of herself through self-esteem coping skills.  Mckinley Olheiser S. Nasirah Sachs, LCAS  

## 2016-04-03 ENCOUNTER — Ambulatory Visit (INDEPENDENT_AMBULATORY_CARE_PROVIDER_SITE_OTHER): Payer: BLUE CROSS/BLUE SHIELD | Admitting: Licensed Clinical Social Worker

## 2016-04-03 DIAGNOSIS — F900 Attention-deficit hyperactivity disorder, predominantly inattentive type: Secondary | ICD-10-CM

## 2016-04-03 DIAGNOSIS — F952 Tourette's disorder: Secondary | ICD-10-CM | POA: Diagnosis not present

## 2016-04-04 ENCOUNTER — Encounter (HOSPITAL_COMMUNITY): Payer: Self-pay | Admitting: Licensed Clinical Social Worker

## 2016-04-04 NOTE — Progress Notes (Signed)
Daily Group Progress Note Program:  Outpatient  Group Time: 5:30-6:30 pm Participation Level: Active Behavioral Response: Appropriate Type of Therapy:  Psychoeducation/Therapy Summary of Progress: Pt participated in a discussion "Sitting with uncomfortable feelings," which means sitting in the discomfort and waiting before acting. Pt shared she feels she always needs to act based on her emotions, often making damaging decisions. Pt was encouraged to continue to develop her emotional intelligence, learn to sit with her negative feelings and create situations for positive feelings. Pt was receptive to the suggestion during the intervention.  Vernona RiegerLisbeth S. Mayelin Panos, LCAS

## 2016-04-05 ENCOUNTER — Ambulatory Visit (INDEPENDENT_AMBULATORY_CARE_PROVIDER_SITE_OTHER): Payer: BLUE CROSS/BLUE SHIELD | Admitting: Licensed Clinical Social Worker

## 2016-04-05 DIAGNOSIS — F900 Attention-deficit hyperactivity disorder, predominantly inattentive type: Secondary | ICD-10-CM

## 2016-04-05 DIAGNOSIS — F952 Tourette's disorder: Secondary | ICD-10-CM

## 2016-04-06 ENCOUNTER — Encounter (HOSPITAL_COMMUNITY): Payer: Self-pay | Admitting: Licensed Clinical Social Worker

## 2016-04-06 NOTE — Progress Notes (Signed)
Daily Group Progress Note Program:  Outpatient  Group Time: 5:30-6:30 pm Participation Level: Active Behavioral Response: Appropriate Type of Therapy:  Psychoeducation/Therapy Summary of Progress: Pt participated in a discussion on feeling emotionally overwhelmed. Pt expressed her state of being with intense emotions that are difficult to manage, affect her ability to think and act rationally or perform in a sufficient and functional manner. Pt was encouraged to work through key issues, trying to discover the roots of her overwhelming emotions and explore ways to prevent her emotions from becoming overwhelming, and use her coping skills to deal with potential emotional stressors that cannot be prevented. Pt was open to suggestions during the intervention.  Cindy Jones S. Pranish Akhavan, LCAS  

## 2016-04-10 ENCOUNTER — Ambulatory Visit (HOSPITAL_COMMUNITY): Payer: Self-pay | Admitting: Licensed Clinical Social Worker

## 2016-04-12 ENCOUNTER — Ambulatory Visit (INDEPENDENT_AMBULATORY_CARE_PROVIDER_SITE_OTHER): Payer: BLUE CROSS/BLUE SHIELD | Admitting: Licensed Clinical Social Worker

## 2016-04-12 DIAGNOSIS — F952 Tourette's disorder: Secondary | ICD-10-CM | POA: Diagnosis not present

## 2016-04-12 DIAGNOSIS — F900 Attention-deficit hyperactivity disorder, predominantly inattentive type: Secondary | ICD-10-CM

## 2016-04-17 ENCOUNTER — Ambulatory Visit (HOSPITAL_COMMUNITY): Payer: BLUE CROSS/BLUE SHIELD | Admitting: Licensed Clinical Social Worker

## 2016-04-17 DIAGNOSIS — F900 Attention-deficit hyperactivity disorder, predominantly inattentive type: Secondary | ICD-10-CM

## 2016-04-17 DIAGNOSIS — F952 Tourette's disorder: Secondary | ICD-10-CM

## 2016-04-18 ENCOUNTER — Encounter (HOSPITAL_COMMUNITY): Payer: Self-pay | Admitting: Licensed Clinical Social Worker

## 2016-04-18 NOTE — Progress Notes (Signed)
Daily Group Progress Note Program:  Outpatient   Group Time: 5:30-6:30 pm  Participation Level: Active  Behavioral Response: Appropriate  Type of Therapy:  Psychoeducation/Therapy  Summary of Progress: Pt participated in a discussion on identifying and coping with new feelings in mental health recovery. Pt was encouraged to continue to talk about her feelings in group, journal her thoughts and feelings and continue to use her effective coping skills.    [name]. [name], LCAS 

## 2016-04-19 ENCOUNTER — Ambulatory Visit (INDEPENDENT_AMBULATORY_CARE_PROVIDER_SITE_OTHER): Payer: BLUE CROSS/BLUE SHIELD | Admitting: Licensed Clinical Social Worker

## 2016-04-19 ENCOUNTER — Encounter (HOSPITAL_COMMUNITY): Payer: Self-pay | Admitting: Licensed Clinical Social Worker

## 2016-04-19 DIAGNOSIS — F952 Tourette's disorder: Secondary | ICD-10-CM

## 2016-04-19 DIAGNOSIS — F900 Attention-deficit hyperactivity disorder, predominantly inattentive type: Secondary | ICD-10-CM | POA: Diagnosis not present

## 2016-04-19 NOTE — Progress Notes (Signed)
Daily Group Progress Note Program:  Outpatient   Group Time: 5:30-6:30 pm  Participation Level: Active  Behavioral Response: Appropriate  Type of Therapy:  Psychoeducation/Therapy  Summary of Progress: Pt participated in a discussion on the effects of relationships on mental health recovery. Normally, persons with mental illness have small social networks than others and have more family members than friends in their relationship circles. Pt was encouraged to widen her circle of relationships by working more on her own self-care and coping skills for mental wellness.  Cindy Jones

## 2016-04-20 ENCOUNTER — Encounter (HOSPITAL_COMMUNITY): Payer: Self-pay | Admitting: Licensed Clinical Social Worker

## 2016-04-20 DIAGNOSIS — F5089 Other specified eating disorder: Secondary | ICD-10-CM | POA: Diagnosis not present

## 2016-04-20 NOTE — Progress Notes (Signed)
Daily Group Progress Note Program:  Outpatient   Group Time: 5:30-6:30 pm  Participation Level: Active  Behavioral Response: Appropriate  Type of Therapy:  Psychoeducation/Therapy  Summary of Progress:  Pt participated in a discussion of mental wellness during the holiday season. It was noted during the discussion that anxiety and depression may become more prevalent during the Christmas holiday especially people who already live with a mental health condition. Pt was encouraged to take extra care in tending to her overall health and wellness during this time. [name]. [name], LCAS 

## 2016-04-24 ENCOUNTER — Ambulatory Visit (HOSPITAL_COMMUNITY): Payer: Self-pay | Admitting: Licensed Clinical Social Worker

## 2016-04-26 ENCOUNTER — Ambulatory Visit (INDEPENDENT_AMBULATORY_CARE_PROVIDER_SITE_OTHER): Payer: BLUE CROSS/BLUE SHIELD | Admitting: Licensed Clinical Social Worker

## 2016-04-26 DIAGNOSIS — F900 Attention-deficit hyperactivity disorder, predominantly inattentive type: Secondary | ICD-10-CM | POA: Diagnosis not present

## 2016-04-26 DIAGNOSIS — F952 Tourette's disorder: Secondary | ICD-10-CM | POA: Diagnosis not present

## 2016-04-27 ENCOUNTER — Encounter (HOSPITAL_COMMUNITY): Payer: Self-pay | Admitting: Psychiatry

## 2016-04-27 ENCOUNTER — Encounter (HOSPITAL_COMMUNITY): Payer: Self-pay | Admitting: Licensed Clinical Social Worker

## 2016-04-27 ENCOUNTER — Ambulatory Visit (INDEPENDENT_AMBULATORY_CARE_PROVIDER_SITE_OTHER): Payer: BLUE CROSS/BLUE SHIELD | Admitting: Psychiatry

## 2016-04-27 VITALS — BP 112/64 | HR 61 | Ht 66.0 in | Wt 168.8 lb

## 2016-04-27 DIAGNOSIS — Z8489 Family history of other specified conditions: Secondary | ICD-10-CM

## 2016-04-27 DIAGNOSIS — F502 Bulimia nervosa: Secondary | ICD-10-CM

## 2016-04-27 DIAGNOSIS — Z818 Family history of other mental and behavioral disorders: Secondary | ICD-10-CM

## 2016-04-27 DIAGNOSIS — Z808 Family history of malignant neoplasm of other organs or systems: Secondary | ICD-10-CM

## 2016-04-27 DIAGNOSIS — F988 Other specified behavioral and emotional disorders with onset usually occurring in childhood and adolescence: Secondary | ICD-10-CM | POA: Diagnosis not present

## 2016-04-27 DIAGNOSIS — F952 Tourette's disorder: Secondary | ICD-10-CM

## 2016-04-27 DIAGNOSIS — Z8249 Family history of ischemic heart disease and other diseases of the circulatory system: Secondary | ICD-10-CM

## 2016-04-27 DIAGNOSIS — Z7982 Long term (current) use of aspirin: Secondary | ICD-10-CM

## 2016-04-27 DIAGNOSIS — Z813 Family history of other psychoactive substance abuse and dependence: Secondary | ICD-10-CM

## 2016-04-27 DIAGNOSIS — Z79899 Other long term (current) drug therapy: Secondary | ICD-10-CM

## 2016-04-27 MED ORDER — AMPHETAMINE-DEXTROAMPHETAMINE 10 MG PO TABS: 10.0000 mg | ORAL_TABLET | Freq: Every day | ORAL | 0 refills | Status: DC

## 2016-04-27 MED ORDER — ADDERALL XR 25 MG PO CP24: 25.0000 mg | ORAL_CAPSULE | Freq: Every day | ORAL | 0 refills | Status: DC

## 2016-04-27 MED ORDER — AMPHETAMINE-DEXTROAMPHETAMINE 10 MG PO TABS: 10.0000 mg | ORAL_TABLET | Freq: Every day | ORAL | 0 refills | Status: DC | PRN

## 2016-04-27 MED ORDER — AMPHETAMINE-DEXTROAMPHET ER 25 MG PO CP24: 25.0000 mg | ORAL_CAPSULE | Freq: Every day | ORAL | 0 refills | Status: DC

## 2016-04-27 NOTE — Progress Notes (Signed)
Patient ID: Cindy Jones, female   DOB: February 02, 1991, 25 y.o.   MRN: [id number] Patient ID: Cindy Jones, female   DOB: February 02, 1991, 25 y.o.   MRN: [id number]  [facility] [id number] Progress Note  Cindy Jones [id number] 25 y.o.  04/27/2016 8:48 AM  Chief Complaint: "I had my gallbladder removed in November"  History of Present Illness: reviewed information below with patient on 04/27/16  and same as previous visits except as noted  Pt has recovered from gallbladder removal and feels so much better.  Work is going well and she is happy and comfortable.   Pt is working with a eating disorder therapist. States she feels hungry and has put on some weight since the surgery. She is concerned. Pt is not restricting or purging. Pt is eating about 2 meals a day. She often skips dinner because she was eating large quantities at night prior to surgery.   Pt denies depression. Denies anhedonia, isolation, crying spells, low motivation, poor hygiene, worthlessness and hopelessness.   Sleep is good. Energy is good.    Group therapy twice a week helps a lot.   Adderall is working well and concentration is good. Reports the increased dose has helped with focus.  Denies irritability and depression. Denies GI upset and HA.   Tourettes is pretty much non-existant during the day except when the Adderall wears off. It centers around eye blinking (first symptom), chest tightening and flaring her nostrils. On days she is tired, upset and stressed she has more symptoms.  Pt went to the eye doctor and was told that her vision doesn't focus at the same time so she got glasses and it helps. Headaches have decreased.   Taking meds as prescribed and denies SE.   Suicidal Ideation: No Plan Formed: No Patient has means to carry out plan: No  Homicidal Ideation: No Plan Formed: No Patient has means to carry out plan: No  Review of Systems: Psychiatric: Agitation: No Hallucination: No Depressed Mood:  No Insomnia: No Hypersomnia: No Altered Concentration: No Feels Worthless: No Grandiose Ideas: No Belief In Special Powers: No New/Increased Substance Abuse: No Compulsions: No  Neurologic: Headache: No Seizure: No Paresthesias: No  Review of Systems  Gastrointestinal: Positive for constipation and diarrhea. Negative for abdominal pain, heartburn, nausea and vomiting.  Musculoskeletal: Negative for back pain, falls, joint pain and neck pain.  Neurological: Negative for dizziness, tremors, seizures, loss of consciousness and headaches.  Psychiatric/Behavioral: Negative for depression, hallucinations, memory loss, substance abuse and suicidal ideas. The patient is not nervous/anxious and does not have insomnia.      Past Medical, Family, Social History: reviewed information below with patient on 04/27/16  and same as previous visits except as noted [place] in [place]. Works at [employer]. Raised in [place] then in [place] in [place]. Father diagnosed with cancer when she was 5 and he passed away when she was 8yo. Mother became alcoholic after he passed. After he passed she went back and forth to her grandparents. Body image issues due to hyper focused step grandmother on weight. Family Members: mom and Twin sister. Close to paternal aunt and uncle.  reports that she has never smoked. She has never used smokeless tobacco. She reports that she drinks about 1.2 oz of alcohol per week . She reports that she does not use drugs.  Family History  Problem Relation Age of Onset  . Alcohol abuse Mother   . Bipolar disorder Mother   . Cancer  Father     sarcoma  . Tourette syndrome Maternal Aunt   . Alcohol abuse Maternal Aunt   . Hyperlipidemia Paternal Aunt   . Tourette syndrome Maternal Grandfather   . Heart disease Paternal Grandfather   . Alcohol abuse Maternal Uncle   . Suicidality Neg Hx     Past Medical History:  Diagnosis Date  . Anxiety   . Asthma    as a child  . Binge  eating disorder   . Dysrhythmia    " I feel it about once a month"  . Frequent headaches   . Migraines   . PID (acute pelvic inflammatory disease)   . Seasonal allergies   . Tourette disorder   . Urinary tract bacterial infections      Outpatient Encounter Prescriptions as of 04/27/2016  Medication Sig  . acetaminophen (TYLENOL) 500 MG tablet Take 500 mg by mouth every 6 (six) hours as needed.  . ADDERALL XR 25 MG 24 hr capsule Take 25 mg by mouth daily.  [name] amphetamine-dextroamphetamine (ADDERALL) 10 MG tablet Take 10 mg by mouth daily as needed. For focus/attentiveness  . aspirin-acetaminophen-caffeine (EXCEDRIN MIGRAINE) 250-250-65 MG per tablet Take 1-2 tablets by mouth every 6 (six) hours as needed for headache.   . clotrimazole-betamethasone (LOTRISONE) cream Apply 1 application topically 2 (two) times daily. (Patient taking differently: Apply 1 application topically 2 (two) times daily as needed (for itching skin). )  . Meth-Hyo-M Bl-Na Phos-Ph Sal (URIBEL) 118 MG CAPS Take 1 capsule by mouth 2 (two) times daily as needed. For urinary discomfort  . fluticasone (FLONASE) 50 MCG/ACT nasal spray SHAKE WELL AND USE 2 SPRAYS IN EACH NOSTRIL DAILY (Patient not taking: Reported on 04/27/2016)  . HYDROcodone-acetaminophen (NORCO/VICODIN) 5-325 MG tablet Take 1-2 tablets by mouth every 4 (four) hours as needed for moderate pain or severe pain. (Patient not taking: Reported on 04/27/2016)  . ondansetron (ZOFRAN ODT) 8 MG disintegrating tablet Take 1 tablet (8 mg total) by mouth every 8 (eight) hours as needed for nausea or vomiting. (Patient not taking: Reported on 04/27/2016)  . ondansetron (ZOFRAN) 4 MG tablet Take 4 mg by mouth every 8 (eight) hours as needed for nausea or vomiting.   Facility-Administered Encounter Medications as of 04/27/2016  Medication  . 0.9 %  sodium chloride infusion    Past Psychiatric History/Hospitalization(s): Anxiety: Yes Bipolar Disorder: No Depression:  Yes Mania: No Psychosis: No Schizophrenia: No Personality Disorder: No Hospitalization for psychiatric illness: No History of Electroconvulsive Shock Therapy: No Prior Suicide Attempts: No  Physical Exam: Constitutional:  BP 112/64   Pulse 61   Ht 5\' 6"  (1.676 m)   Wt 168 lb 12.8 oz (76.6 kg)   BMI 27.25 kg/m   General Appearance: alert, oriented, no acute distress and well nourished  Musculoskeletal: Strength & Muscle Tone: within normal limits Gait & Station: normal Patient leans: straight  Mental Status Examination/Evaluation: reviewed below MSE on 04/27/16  and same as previous visits except as noted Objective: Attitude: Calm and cooperative  Appearance: Fairly Groomed, appears to be stated age  Eye Contact::  Good  Speech:  Clear and Coherent and Normal Rate  Volume:  Normal  Mood:  euthymic  Affect:  Full Range  Thought Process:  Goal Directed, Linear and Logical  Orientation:  Full (Time, Place, and Person)  Thought Content:  Negative  Suicidal Thoughts:  No  Homicidal Thoughts:  No  Judgement:  Fair  Insight:  Fair  Concentration: good  Memory: Immediate-good  Recent-good Remote-good  Recall: fair  Language: fair  Gait and Station: normal  [employer] of Knowledge: average  Psychomotor Activity:  Normal  Akathisia:  No  Handed:  Right  AIMS (if indicated): n/a  Assets:  Communication Skills Desire for Improvement Financial Resources/Insurance Housing Intimacy Leisure Time Physical Health Resilience Social Support Talents/Skills Transportation Vocational/Educational      reviewed below A&P on 04/27/16  and same as previous visits except as noted  Assessment: AXIS I ADHD- inattentive type and Tourettes disorder. Bulimia; MDD-resolved  AXIS II Deferred   Treatment Plan/Recommendations:  Plan of Care: Medication management with supportive therapy. Risks/benefits and SE of the medication discussed. Pt verbalized understanding and  verbal consent obtained for treatment. Affirm with the patient that the medications are taken as ordered. Patient expressed understanding of how their medications were to be used.      Laboratory: Reviewed labs with pt- 12/24/2013 WNL; EKG sinus arrhythmia, NSTEMI  Psychotherapy: Therapy: brief supportive therapy provided. Discussed psychosocial stressors in detail.    Medications: Adderall XR 25mg  po qD and Adderall to 10mg  qLunch as needed for ADD. Pt wants to have kids in the next 3 yrs. Pt understands the medication can have teratogenic effects and will discuss with MD prior to trying to get pregnant.  May consider adding small dose of Zyprexa for Tourettes in the future if needed  Routine PRN Medications: No  Consultations: encouraged to continue therapy  Safety Concerns: Pt denies SI and is at an acute low risk for suicide.Patient told to call clinic if any problems occur. Patient advised to go to ER if they should develop SI/HI, side effects, or if symptoms worsen. Has crisis numbers to call if needed. Pt verbalized understanding.   Other: F/up in 3 months or sooner if needed    Oletta DarterSalina Deondray Ospina, MD 04/27/2016

## 2016-04-27 NOTE — Progress Notes (Signed)
Daily Group Progress Note Program:  Outpatient  Group Time: 5:30-6:30 pm  Participation Level: Active  Behavioral Response: Appropriate  Type of Therapy:  Psychoeducation/Therapy  Summary of Progress: Pt participated in a discussion about resiliency, her ability to remain stable and maintain healthy levels of psychological and physical functioning in the face of disruption and chaos. Pt was encouraged to continue to harness her internal locus of control, rather than outside sources, to control her own life.  [name]. [name], LCAS  

## 2016-05-08 ENCOUNTER — Ambulatory Visit (HOSPITAL_COMMUNITY): Payer: Self-pay | Admitting: Licensed Clinical Social Worker

## 2016-05-08 ENCOUNTER — Telehealth (HOSPITAL_COMMUNITY): Payer: Self-pay | Admitting: Licensed Clinical Social Worker

## 2016-05-08 DIAGNOSIS — F5089 Other specified eating disorder: Secondary | ICD-10-CM | POA: Diagnosis not present

## 2016-05-10 ENCOUNTER — Ambulatory Visit (HOSPITAL_COMMUNITY): Payer: Self-pay | Admitting: Licensed Clinical Social Worker

## 2016-05-15 ENCOUNTER — Ambulatory Visit (INDEPENDENT_AMBULATORY_CARE_PROVIDER_SITE_OTHER): Payer: BLUE CROSS/BLUE SHIELD | Admitting: Licensed Clinical Social Worker

## 2016-05-15 DIAGNOSIS — F952 Tourette's disorder: Secondary | ICD-10-CM | POA: Diagnosis not present

## 2016-05-15 DIAGNOSIS — F988 Other specified behavioral and emotional disorders with onset usually occurring in childhood and adolescence: Secondary | ICD-10-CM

## 2016-05-17 ENCOUNTER — Ambulatory Visit (HOSPITAL_COMMUNITY): Payer: Self-pay | Admitting: Licensed Clinical Social Worker

## 2016-05-18 ENCOUNTER — Encounter (HOSPITAL_COMMUNITY): Payer: Self-pay | Admitting: Licensed Clinical Social Worker

## 2016-05-18 NOTE — Progress Notes (Signed)
Daily Group Progress Note Program:  Outpatient  Group Time: 5:30-6:30 pm Participation Level: Active Behavioral Response: Appropriate Type of Therapy:  Psychoeducation/Therapy Summary of Progress: Pt participated in a discussion on personal responsibility as one of the key concepts to recovery. Pt has been able to take action and do what needs to get done to get well and stay well. It was suggested to pt that taking ownership of her life and future is her first step to gain back control of her life. Pt was active during the intervention and was open to suggestions.  [name] S. Selina Tapper, LCAS 

## 2016-05-22 ENCOUNTER — Ambulatory Visit (HOSPITAL_COMMUNITY): Payer: Self-pay | Admitting: Licensed Clinical Social Worker

## 2016-05-24 ENCOUNTER — Ambulatory Visit (HOSPITAL_COMMUNITY): Payer: Self-pay | Admitting: Licensed Clinical Social Worker

## 2016-05-29 ENCOUNTER — Ambulatory Visit (HOSPITAL_COMMUNITY): Payer: Self-pay | Admitting: Licensed Clinical Social Worker

## 2016-05-31 ENCOUNTER — Ambulatory Visit (INDEPENDENT_AMBULATORY_CARE_PROVIDER_SITE_OTHER): Payer: BLUE CROSS/BLUE SHIELD | Admitting: Licensed Clinical Social Worker

## 2016-05-31 DIAGNOSIS — F900 Attention-deficit hyperactivity disorder, predominantly inattentive type: Secondary | ICD-10-CM

## 2016-05-31 DIAGNOSIS — F952 Tourette's disorder: Secondary | ICD-10-CM

## 2016-06-01 ENCOUNTER — Encounter (HOSPITAL_COMMUNITY): Payer: Self-pay | Admitting: Licensed Clinical Social Worker

## 2016-06-01 NOTE — Progress Notes (Signed)
Daily Group Progress Note Program:  Outpatient  Group Time: 5:30-6:30 pm  Participation Level: Active  Behavioral Response: Appropriate  Type of Therapy:  Psychoeducation/Therapy  Summary of Progress:  Pt participated in a discussion on "Acceptance of a mental health problem." Defining acceptance as a process of recognizing and dealing with symptoms and challenges. Pt acknowledge that acceptance is a process and overcoming challenges can be difficult without support. Pt was encouraged to continue to work on the challenges of a mental health diagnosis and to work towards mental wellness. Pt was receptive to suggestions during the intervention.  Walter Min S. Latiana Tomei, LCAS 

## 2016-06-05 ENCOUNTER — Ambulatory Visit (HOSPITAL_COMMUNITY): Payer: Self-pay | Admitting: Licensed Clinical Social Worker

## 2016-06-07 ENCOUNTER — Other Ambulatory Visit (INDEPENDENT_AMBULATORY_CARE_PROVIDER_SITE_OTHER): Payer: BLUE CROSS/BLUE SHIELD

## 2016-06-07 ENCOUNTER — Ambulatory Visit (HOSPITAL_COMMUNITY): Payer: Self-pay | Admitting: Licensed Clinical Social Worker

## 2016-06-07 ENCOUNTER — Telehealth: Payer: Self-pay | Admitting: Family Medicine

## 2016-06-07 ENCOUNTER — Ambulatory Visit (INDEPENDENT_AMBULATORY_CARE_PROVIDER_SITE_OTHER): Payer: BLUE CROSS/BLUE SHIELD | Admitting: Family Medicine

## 2016-06-07 ENCOUNTER — Encounter: Payer: Self-pay | Admitting: Family Medicine

## 2016-06-07 VITALS — BP 117/82 | HR 66 | Temp 98.9°F | Resp 20 | Wt 170.5 lb

## 2016-06-07 DIAGNOSIS — K8010 Calculus of gallbladder with chronic cholecystitis without obstruction: Secondary | ICD-10-CM

## 2016-06-07 DIAGNOSIS — Z9049 Acquired absence of other specified parts of digestive tract: Secondary | ICD-10-CM

## 2016-06-07 DIAGNOSIS — R1011 Right upper quadrant pain: Secondary | ICD-10-CM

## 2016-06-07 NOTE — Telephone Encounter (Signed)
Patient Name: Cindy Jones  DOB: 1991/02/24    Initial Comment Caller states had gallbladder removed in Nov, had liver bx at the same time. She is having some swelling and having sharp pain    Nurse Assessment  Nurse: Scarlette ArStandifer, RN, Heather Date/Time (Eastern Time): 06/07/2016 9:40:49 AM  Confirm and document reason for call. If symptomatic, describe symptoms. ---Caller states had gallbladder removed in Nov, had liver bx at the same time. She is having some swelling and having sharp pain that started on Monday midday  Does the patient have any new or worsening symptoms? ---Yes  Will a triage be completed? ---Yes  Related visit to physician within the last 2 weeks? ---No  Does the PT have any chronic conditions? (i.e. diabetes, asthma, etc.) ---Yes  List chronic conditions. ---gallbladder removed, liver biopsy  Is the patient pregnant or possibly pregnant? (Ask all females between the ages of 4012-55) ---No  Is this a behavioral health or substance abuse call? ---No     Guidelines    Guideline Title Affirmed Question Affirmed Notes  Abdominal Pain - Female [1] MILD-MODERATE pain AND [2] constant AND [3] present > 2 hours    Final Disposition User   See Physician within 4 Hours (or PCP triage) Standifer, RN, Herbert SetaHeather    Comments  Appt with Felix Pacinienee Kuneff at 1:30 pm.   Referrals  REFERRED TO PCP OFFICE   Disagree/Comply: Comply

## 2016-06-07 NOTE — Telephone Encounter (Signed)
Patient scheduled for appt today at 1;30

## 2016-06-07 NOTE — Patient Instructions (Signed)
I have ordered labs and ultrasound of abdomen.  You can take NSAIDS for stomach if it does not cause increase in GERD symptoms.  Consider restarting omeprazole, you can use over the counter.  Avoid any alcohol for now.   If symptoms worsen (Pain, fever, illness)  go to ED.

## 2016-06-07 NOTE — Progress Notes (Signed)
Cindy Jones , February 04, 1991, 26 y.o., female MRN: [id number] Patient Care Team    Relationship Specialty Notifications Start End  Cindy Hillock, DO PCP - General Family Medicine  07/14/15   Cindy Boston, MD Consulting Physician General Surgery  01/31/16   Cindy Gunning, MD Consulting Physician Gastroenterology  01/31/16     CC: Right sided abd pain  Subjective: Pt presents for an acute OV with complaints of right abd pain. She states she experienced a cramping like pain in her RUQ on Monday that has not completely resolved. She was sitting at the time of the pain occurrence. The pain is worse when inhaling, bending over or to the right. She denies nausea, fever, chills, vomit, constipation, diarrhea or dysuria. She has been taking advil for pain. She is tolerating PO, but has decreased appetite. She had some bloating and "acid feeling" in her stomach. She has a h/o GERD, frequent UTI, lap chole with liver bx 3 months ago. She endorses 2 glasses of wine the night prior to pain, but states she has had wine since her surgery without any issues.   Of note, she called her surgeon (Dr. Johney Jones) concerning issue and was told to go to ED.   Allergies  Allergen Reactions  . Latex Swelling and Rash  . Cranberry Juice Powder Other (See Comments)    Throat pain.  . Naproxen Other (See Comments)    "Liver starts hurting" Diarrhea  . Sulfa Antibiotics Hives and Swelling   Social History  Substance Use Topics  . Smoking status: Never Smoker  . Smokeless tobacco: Never Used  . Alcohol use 1.2 oz/week    2 Glasses of wine per week     Comment: occ   Past Medical History:  Diagnosis Date  . Anxiety   . Asthma    as a child  . Binge eating disorder   . Dysrhythmia    " I feel it about once a month"  . Frequent headaches   . Migraines   . PID (acute pelvic inflammatory disease)   . Seasonal allergies   . Tourette disorder   . Urinary tract bacterial infections    Past Surgical  History:  Procedure Laterality Date  . CHOLECYSTECTOMY    . ESOPHAGOGASTRODUODENOSCOPY    . LAPAROSCOPIC CHOLECYSTECTOMY SINGLE SITE WITH INTRAOPERATIVE CHOLANGIOGRAM N/A 03/15/2016   Procedure: LAPAROSCOPIC CHOLECYSTECTOMY SINGLE SITE WITH INTRAOPERATIVE CHOLANGIOGRAM;  Surgeon: Cindy Boston, MD;  Location: [facility];  Service: General;  Laterality: N/A;  . TONSILLECTOMY AND ADENOIDECTOMY  2013  . WISDOM TOOTH EXTRACTION     Family History  Problem Relation Age of Onset  . Alcohol abuse Mother   . Bipolar disorder Mother   . Cancer Father     sarcoma  . Tourette syndrome Maternal Aunt   . Alcohol abuse Maternal Aunt   . Hyperlipidemia Paternal Aunt   . Tourette syndrome Maternal Grandfather   . Heart disease Paternal Grandfather   . Alcohol abuse Maternal Uncle   . Suicidality Neg Hx    Allergies as of 06/07/2016      Reactions   Latex Swelling, Rash   Cranberry Juice Powder Other (See Comments)   Throat pain.   Naproxen Other (See Comments)   "Liver starts hurting" Diarrhea   Sulfa Antibiotics Hives, Swelling      Medication List       Accurate as of 06/07/16  1:43 PM. Always use your most recent med list.  acetaminophen 500 MG tablet Commonly known as:  TYLENOL Take 500 mg by mouth every 6 (six) hours as needed.   amphetamine-dextroamphetamine 10 MG tablet Commonly known as:  ADDERALL Take 1 tablet (10 mg total) by mouth daily as needed. For focus/attentiveness   amphetamine-dextroamphetamine 25 MG 24 hr capsule Commonly known as:  ADDERALL XR Take 1 capsule by mouth daily.   aspirin-acetaminophen-caffeine 250-250-65 MG tablet Commonly known as:  EXCEDRIN MIGRAINE Take 1-2 tablets by mouth every 6 (six) hours as needed for headache.   clotrimazole-betamethasone cream Commonly known as:  LOTRISONE Apply 1 application topically 2 (two) times daily.   fluticasone 50 MCG/ACT nasal spray Commonly known as:  FLONASE SHAKE WELL AND USE 2 SPRAYS IN EACH  NOSTRIL DAILY   ibuprofen 200 MG tablet Commonly known as:  ADVIL,MOTRIN Take 200 mg by mouth every 6 (six) hours as needed.   URIBEL 118 MG Caps Take 1 capsule by mouth 2 (two) times daily as needed. For urinary discomfort       No results found for this or any previous visit (from the past 24 hour(s)). No results found.   ROS: Negative, with the exception of above mentioned in HPI   Objective:  BP 117/82 (BP Location: Left Arm, Patient Position: Sitting, Cuff Size: Large)   Pulse 66   Temp 98.9 F (37.2 C)   Resp 20   Wt 170 lb 8 oz (77.3 kg)   LMP 06/01/2016   SpO2 99%   BMI 27.52 kg/m  Body mass index is 27.52 kg/m. Gen: Afebrile. No acute distress. Nontoxic in appearance. Well developed, well nourished, caucasian female. Tearful with exam.  HENT: AT. [place]. MMM.  Eyes:Pupils Equal Round Reactive to light, Extraocular movements intact,  Conjunctiva without redness, discharge or icterus. CV: RRR, no edema Abd: Soft. Flat. ND. TTP RUQ and LUQ. BS present. no Masses palpated. No guarding or rebound tenderness. + murphy's sign - became tearful(GB removed). Skin: no rashes, purpura or petechiae.  Neuro: Normal gait. PERLA. EOMi. Alert. Oriented.   Assessment/Plan: Cindy Jones is a 26 y.o. female present for acute OV for ABD pain RUQ pain/S/P laparoscopic cholecystectomy - Pt became tearful with palpation over RUQ. She declined tramadol script for pain. She can continue NSAIDS as long as not upsetting stomach.  - Consider possible pancreatitis, occurred after ETOH consumption the night prior. Pt is scared it is because of her liver bx (3 months prior).  - She can consider taking PPI OTC. - Lipase - CBC w/Diff - Comp Met (CMET) - C-reactive protein - US Abdomen Complete; Future - if pain, fever, nausea or illness worsens she is to be seen immediately in the ED. Pt wanted image study today, discussed [place] orders are not STAT. She needs to be fasting prior to [place] and if she  feels she needs her study  stat then that would be emergent care (in the ED).   electronically signed by:  Cindy Pouch, DO  [place]

## 2016-06-08 ENCOUNTER — Telehealth: Payer: Self-pay | Admitting: Family Medicine

## 2016-06-08 ENCOUNTER — Ambulatory Visit (HOSPITAL_BASED_OUTPATIENT_CLINIC_OR_DEPARTMENT_OTHER)
Admission: RE | Admit: 2016-06-08 | Discharge: 2016-06-08 | Disposition: A | Discharge status: Home / Self Care | Payer: BLUE CROSS/BLUE SHIELD | Source: Ambulatory Visit | Attending: Family Medicine | Admitting: Family Medicine

## 2016-06-08 ENCOUNTER — Telehealth: Payer: Self-pay | Admitting: *Deleted

## 2016-06-08 DIAGNOSIS — R1011 Right upper quadrant pain: Secondary | ICD-10-CM | POA: Diagnosis not present

## 2016-06-08 DIAGNOSIS — Z9049 Acquired absence of other specified parts of digestive tract: Secondary | ICD-10-CM | POA: Diagnosis not present

## 2016-06-08 LAB — COMPREHENSIVE METABOLIC PANEL
ALT: 16 U/L (ref 0–35)
AST: 22 U/L (ref 0–37)
Albumin: 4.6 g/dL (ref 3.5–5.2)
Alkaline Phosphatase: 40 U/L (ref 39–117)
BUN: 9 mg/dL (ref 6–23)
CO2: 26 mEq/L (ref 19–32)
Calcium: 9.4 mg/dL (ref 8.4–10.5)
Chloride: 106 mEq/L (ref 96–112)
Creatinine, Ser: 0.75 mg/dL (ref 0.40–1.20)
GFR: 99.63 mL/min (ref >60.00)
Glucose, Bld: 76 mg/dL (ref 70–99)
Potassium: 4.3 mEq/L (ref 3.5–5.1)
Sodium: 141 mEq/L (ref 135–145)
Total Bilirubin: 0.4 mg/dL (ref 0.2–1.2)
Total Protein: 7.5 g/dL (ref 6.0–8.3)

## 2016-06-08 LAB — CBC WITH DIFFERENTIAL/PLATELET
Basophils Absolute: 0 10*3/uL (ref 0.0–0.1)
Basophils Relative: 0.8 % (ref 0.0–3.0)
Eosinophils Absolute: 0.1 10*3/uL (ref 0.0–0.7)
Eosinophils Relative: 2.3 % (ref 0.0–5.0)
HCT: 38.9 % (ref 36.0–46.0)
Hemoglobin: 12.9 g/dL (ref 12.0–15.0)
Lymphocytes Relative: 24.3 % (ref 12.0–46.0)
Lymphs Abs: 1.2 10*3/uL (ref 0.7–4.0)
MCHC: 33.1 g/dL (ref 30.0–36.0)
MCV: 92.6 fl (ref 78.0–100.0)
Monocytes Absolute: 0.3 10*3/uL (ref 0.1–1.0)
Monocytes Relative: 6.5 % (ref 3.0–12.0)
Neutro Abs: 3.3 10*3/uL (ref 1.4–7.7)
Neutrophils Relative %: 66.1 % (ref 43.0–77.0)
Platelets: 198 10*3/uL (ref 150.0–400.0)
RBC: 4.2 Mil/uL (ref 3.87–5.11)
RDW: 13.7 % (ref 11.5–15.5)
WBC: 5 10*3/uL (ref 4.0–10.5)

## 2016-06-08 LAB — LIPASE: Lipase: 31 U/L (ref 11.0–59.0)

## 2016-06-08 LAB — C-REACTIVE PROTEIN: CRP: 0 mg/dL — ABNORMAL LOW (ref 0.5–20.0)

## 2016-06-08 NOTE — Telephone Encounter (Signed)
Spoke with patient reviewed labs and US results. Advised patient if symptoms persist to follow up with GI or If worsening symptoms go to ED. Patient verbalized understanding.

## 2016-06-08 NOTE — Telephone Encounter (Signed)
Patient notified

## 2016-06-08 NOTE — Telephone Encounter (Signed)
Her labs are also normal. Please make her aware.

## 2016-06-08 NOTE — Telephone Encounter (Signed)
Her US is normal. They stated the liver was mildly prominent, but no abnormalities.  I do not have any of her labs back yet and likely will not until tomorrow. We will call her when we do.  If symptoms are worsening she should be seen urgently. She also has a gastro doctor she could see and where I would recommend she be seen if symptoms continue.

## 2016-06-08 NOTE — Telephone Encounter (Signed)
Please call patient back about results at [phone]. -KE

## 2016-06-12 ENCOUNTER — Ambulatory Visit (HOSPITAL_COMMUNITY): Payer: Self-pay | Admitting: Licensed Clinical Social Worker

## 2016-06-13 ENCOUNTER — Ambulatory Visit (HOSPITAL_COMMUNITY): Payer: Self-pay | Admitting: Licensed Clinical Social Worker

## 2016-06-14 ENCOUNTER — Ambulatory Visit (HOSPITAL_COMMUNITY): Payer: Self-pay | Admitting: Licensed Clinical Social Worker

## 2016-06-19 ENCOUNTER — Ambulatory Visit (HOSPITAL_COMMUNITY): Payer: Self-pay | Admitting: Licensed Clinical Social Worker

## 2016-06-20 ENCOUNTER — Ambulatory Visit (HOSPITAL_COMMUNITY): Payer: Self-pay | Admitting: Licensed Clinical Social Worker

## 2016-06-21 ENCOUNTER — Ambulatory Visit (HOSPITAL_COMMUNITY): Payer: Self-pay | Admitting: Licensed Clinical Social Worker

## 2016-06-26 ENCOUNTER — Ambulatory Visit (HOSPITAL_COMMUNITY): Payer: Self-pay | Admitting: Licensed Clinical Social Worker

## 2016-06-27 ENCOUNTER — Ambulatory Visit (HOSPITAL_COMMUNITY): Payer: Self-pay | Admitting: Licensed Clinical Social Worker

## 2016-07-04 ENCOUNTER — Ambulatory Visit (HOSPITAL_COMMUNITY): Payer: Self-pay | Admitting: Licensed Clinical Social Worker

## 2016-07-10 ENCOUNTER — Ambulatory Visit (INDEPENDENT_AMBULATORY_CARE_PROVIDER_SITE_OTHER): Payer: BLUE CROSS/BLUE SHIELD | Admitting: Family Medicine

## 2016-07-10 ENCOUNTER — Telehealth: Payer: Self-pay | Admitting: Family Medicine

## 2016-07-10 ENCOUNTER — Encounter: Payer: Self-pay | Admitting: Family Medicine

## 2016-07-10 VITALS — BP 121/82 | HR 66 | Temp 98.1°F | Resp 20 | Wt 166.0 lb

## 2016-07-10 DIAGNOSIS — R350 Frequency of micturition: Secondary | ICD-10-CM

## 2016-07-10 LAB — POC URINALSYSI DIPSTICK (AUTOMATED)
Blood, UA: NEGATIVE
Glucose, UA: NEGATIVE
Ketones, UA: NEGATIVE
Leukocytes, UA: NEGATIVE
Nitrite, UA: NEGATIVE
Spec Grav, UA: 1.025
Urobilinogen, UA: 0.2
pH, UA: 6

## 2016-07-10 MED ORDER — CIPROFLOXACIN HCL 500 MG PO TABS: 500.0000 mg | ORAL_TABLET | Freq: Two times a day (BID) | ORAL | 0 refills | Status: DC

## 2016-07-10 NOTE — Progress Notes (Signed)
Cindy Jones , [birth year], 26 y.o., female MRN: [id number] Patient Care Team    Relationship Specialty Notifications Start End  Natalia Leatherwoodenee A Benjamin Casanas, DO PCP - General Family Medicine  07/14/15   Karie SodaSteven Gross, MD Consulting Physician General Surgery  01/31/16   Ruffin FrederickSteven Paul Armbruster, MD Consulting Physician Gastroenterology  01/31/16     CC: urinary discomfort.  Subjective: Pt presents for an  OV with complaints of urinary discomfort of 2 days duration.  Associated symptoms include urinary frequency. She reports an episode of a stomach virus over the weekend with diarrhea and her urinary symptoms followed.   [facility] 07/04/2015 05/10/2015 11/06/2014  Decreased Interest 0 0 0  Down, Depressed, Hopeless 0 0 0  PHQ - 2 Score 0 0 0    Allergies  Allergen Reactions  . Latex Swelling and Rash  . Cranberry Juice Powder Other (See Comments)    Throat pain.  . Naproxen Other (See Comments)    "Liver starts hurting" Diarrhea  . Sulfa Antibiotics Hives and Swelling   Social History  Substance Use Topics  . Smoking status: Never Smoker  . Smokeless tobacco: Never Used  . Alcohol use 1.2 oz/week    2 Glasses of wine per week     Comment: occ   Past Medical History:  Diagnosis Date  . Anxiety   . Asthma    as a child  . Binge eating disorder   . Dysrhythmia    " I feel it about once a month"  . Frequent headaches   . Migraines   . PID (acute pelvic inflammatory disease)   . Seasonal allergies   . Tourette disorder   . Urinary tract bacterial infections    Past Surgical History:  Procedure Laterality Date  . CHOLECYSTECTOMY    . ESOPHAGOGASTRODUODENOSCOPY    . LAPAROSCOPIC CHOLECYSTECTOMY SINGLE SITE WITH INTRAOPERATIVE CHOLANGIOGRAM N/A 03/15/2016   Procedure: LAPAROSCOPIC CHOLECYSTECTOMY SINGLE SITE WITH INTRAOPERATIVE CHOLANGIOGRAM;  Surgeon: Karie SodaSteven Gross, MD;  [facility];  Service: General;  Laterality: N/A;  . TONSILLECTOMY AND ADENOIDECTOMY  2013  .  WISDOM TOOTH EXTRACTION     Family History  Problem Relation Age of Onset  . Alcohol abuse Mother   . Bipolar disorder Mother   . Cancer Father     sarcoma  . Tourette syndrome Maternal Aunt   . Alcohol abuse Maternal Aunt   . Hyperlipidemia Paternal Aunt   . Tourette syndrome Maternal Grandfather   . Heart disease Paternal Grandfather   . Alcohol abuse Maternal Uncle   . Suicidality Neg Hx    Allergies as of 07/10/2016      Reactions   Latex Swelling, Rash   Cranberry Juice Powder Other (See Comments)   Throat pain.   Naproxen Other (See Comments)   "Liver starts hurting" Diarrhea   Sulfa Antibiotics Hives, Swelling      Medication List       Accurate as of 07/10/16 11:50 AM. Always use your most recent med list.          acetaminophen 500 MG tablet Commonly known as:  TYLENOL Take 500 mg by mouth every 6 (six) hours as needed.   amphetamine-dextroamphetamine 10 MG tablet Commonly known as:  ADDERALL Take 1 tablet (10 mg total) by mouth daily as needed. For focus/attentiveness   amphetamine-dextroamphetamine 25 MG 24 hr capsule Commonly known as:  ADDERALL XR Take 1 capsule by mouth daily.   aspirin-acetaminophen-caffeine 250-250-65 MG tablet Commonly known as:  EXCEDRIN MIGRAINE Take 1-2 tablets by mouth every 6 (six) hours as needed for headache.   ciprofloxacin 500 MG tablet Commonly known as:  CIPRO Take 1 tablet (500 mg total) by mouth 2 (two) times daily.   clotrimazole-betamethasone cream Commonly known as:  LOTRISONE Apply 1 application topically 2 (two) times daily.   fluticasone 50 MCG/ACT nasal spray Commonly known as:  FLONASE SHAKE WELL AND USE 2 SPRAYS IN EACH NOSTRIL DAILY   ibuprofen 200 MG tablet Commonly known as:  ADVIL,MOTRIN Take 200 mg by mouth every 6 (six) hours as needed.   URIBEL 118 MG Caps Take 1 capsule by mouth 2 (two) times daily as needed. For urinary discomfort       Results for orders placed or performed in  visit on 07/10/16 (from the past 24 hour(s))  POCT Urinalysis Dipstick (Automated)     Status: None   Collection Time: 07/10/16 11:36 AM  Result Value Ref Range   Color, UA Light lime / yellow    Clarity, UA clear    Glucose, UA negative    Bilirubin, UA small    Ketones, UA negative    Spec Grav, UA 1.025 1.003, 1.005, 1.010, 1.015, 1.020, 1.025, 1.030   Blood, UA negative    pH, UA 6.0 5.0, 5.5, 6.0, 6.5, 7.0, 7.5, 8.0   Protein, UA trace    Urobilinogen, UA 0.2 0.2, 1.0   Nitrite, UA negative    Leukocytes, UA Negative Negative   No results found.   ROS: Negative, with the exception of above mentioned in HPI   Objective:  BP 121/82 (BP Location: Right Arm, Patient Position: Sitting, Cuff Size: Normal)   Pulse 66   Temp 98.1 F (36.7 C)   Resp 20   Wt 166 lb (75.3 kg)   SpO2 98%   BMI 26.79 kg/m  Body mass index is 26.79 kg/m. Gen: Afebrile. No acute distress. Nontoxic in appearance, well developed, well nourished.  HENT: AT. [place]. MMM Eyes:Pupils Equal Round Reactive to light, Extraocular movements intact,  Conjunctiva without redness, discharge or icterus. CV: RRR  Abd: Soft. Very mild suprapubic discomfort.D. BS present. no Masses palpated.  MSK: No CVA tenderness Neuro: Normal gait. PERLA. EOMi. Alert. Oriented x3   Assessment/Plan: Cindy Jones is a 26 y.o. female present for OV for urinary symptoms.  Urinary frequency/abnl urine.  - POCT skewed by uribel use - POCT Urinalysis Dipstick (Automated) - Urinalysis, Routine w reflex microscopic - Urine Culture - cipro BID x 3 days   Reviewed expectations re: course of current medical issues.  Discussed self-management of symptoms.  Outlined signs and symptoms indicating need for more acute intervention.  Patient verbalized understanding and all questions were answered.  Patient received an After-Visit Summary.   electronically signed by:  Felix Pacini, DO  [facility] - OR

## 2016-07-10 NOTE — Telephone Encounter (Signed)
Patient requesting an antibiotic for UTI to possibly be filled as soon as possible due to the snow and having a UTI.  [facility] [phone] [place] - [id number] [facility] [address] [phone] (Phone) [phone] (Fax)   Please call patient to advise when order has been placed. Patient also scheduled an appointment just in case Dr. Claiborne BillingsKuneff wanted to see her first.

## 2016-07-10 NOTE — Patient Instructions (Signed)
I have called in cipro every 12 hours for 3 days. We will call you  With results once available.  Drink plenty of water.

## 2016-07-11 ENCOUNTER — Ambulatory Visit (HOSPITAL_COMMUNITY): Payer: Self-pay | Admitting: Licensed Clinical Social Worker

## 2016-07-11 LAB — URINALYSIS, ROUTINE W REFLEX MICROSCOPIC
Bilirubin Urine: NEGATIVE
Glucose, UA: NEGATIVE
Hgb urine dipstick: NEGATIVE
Ketones, ur: NEGATIVE
Leukocytes, UA: NEGATIVE
Nitrite: NEGATIVE
Protein, ur: NEGATIVE
Specific Gravity, Urine: 1.025 (ref 1.001–1.035)
pH: 6 (ref 5.0–8.0)

## 2016-07-12 ENCOUNTER — Telehealth: Payer: Self-pay | Admitting: Family Medicine

## 2016-07-12 LAB — URINE CULTURE: Organism ID, Bacteria: NO GROWTH

## 2016-07-12 NOTE — Telephone Encounter (Signed)
Please call pt: - her urine culture was negative for infection. No bacteria growth.  - She can continue abx if symptoms are improving. However her symptoms were likley from bladder irritation with her GI issue (she has the stomach bug) and not bacteria infection of the urinary tract,

## 2016-07-12 NOTE — Telephone Encounter (Signed)
Message left on voice mail for patient to return call. 

## 2016-07-12 NOTE — Telephone Encounter (Signed)
Patient notified and verbalized understanding. 

## 2016-07-14 ENCOUNTER — Other Ambulatory Visit: Payer: Self-pay | Admitting: Family Medicine

## 2016-07-14 ENCOUNTER — Ambulatory Visit (INDEPENDENT_AMBULATORY_CARE_PROVIDER_SITE_OTHER): Payer: BLUE CROSS/BLUE SHIELD | Admitting: Family Medicine

## 2016-07-14 ENCOUNTER — Encounter: Payer: Self-pay | Admitting: Family Medicine

## 2016-07-14 VITALS — BP 116/79 | HR 78 | Temp 98.3°F | Resp 20 | Ht 66.0 in | Wt 165.5 lb

## 2016-07-14 DIAGNOSIS — Z13220 Encounter for screening for lipoid disorders: Secondary | ICD-10-CM | POA: Diagnosis not present

## 2016-07-14 DIAGNOSIS — Z0000 Encounter for general adult medical examination without abnormal findings: Secondary | ICD-10-CM

## 2016-07-14 DIAGNOSIS — Z131 Encounter for screening for diabetes mellitus: Secondary | ICD-10-CM

## 2016-07-14 DIAGNOSIS — Z1329 Encounter for screening for other suspected endocrine disorder: Secondary | ICD-10-CM | POA: Diagnosis not present

## 2016-07-14 LAB — LDL CHOLESTEROL, DIRECT: Direct LDL: 53 mg/dL

## 2016-07-14 LAB — TSH: TSH: 1.44 u[IU]/mL (ref 0.35–4.50)

## 2016-07-14 NOTE — Progress Notes (Signed)
Patient ID: Cindy Jones, female  DOB: 06/18/1990, 26 y.o.   MRN: [id number] Patient Care Team    Relationship Specialty Notifications Start End  Natalia Leatherwood, DO PCP - General Family Medicine  07/14/15   Karie Soda, MD Consulting Physician General Surgery  01/31/16   Ruffin Frederick, MD Consulting Physician Gastroenterology  01/31/16     Subjective:  Cindy Jones is a 26 y.o.  Female  present for CPE. All past medical history, surgical history, allergies, family history, immunizations, medications and social history were updated in the electronic medical record today. All recent labs, ED visits and hospitalizations within the last year were reviewed.  Health maintenance:  Colonoscopy: N/A, EGD completed 12/2015 Dr. Adela Lank Mammogram: No Fhx screen at 40 Cervical cancer screening: last pap: 2015, results: transition zone absent/reparative cells, completed by: Dr.Dove Immunizations: tdap UTD 2017, Influenza 2017 (encouraged yearly) Infectious disease screening: HIV completed 2015 DEXA: N/A Assistive device: None Oxygen WUJ:WJXB Patient has a Dental home. Hospitalizations/ED visits: reviewed  Depression screen [facility] 2/9 07/14/2016 07/04/2015 05/10/2015 11/06/2014  Decreased Interest 0 0 0 0  Down, Depressed, Hopeless 0 0 0 0  PHQ - 2 Score 0 0 0 0    Immunization History  Administered Date(s) Administered  . Influenza,inj,Quad PF,36+ Mos 06/08/2014  . Influenza-Unspecified 02/24/2015  . Td 07/14/2015  . Tdap 05/01/2005     Past Medical History:  Diagnosis Date  . Anxiety   . Asthma    as a child  . Binge eating disorder   . Dysrhythmia    " I feel it about once a month"  . Frequent headaches   . Migraines   . PID (acute pelvic inflammatory disease)   . Seasonal allergies   . Tourette disorder   . Urinary tract bacterial infections    Allergies  Allergen Reactions  . Latex Swelling and Rash  . Cranberry Juice Powder Other (See Comments)    Throat  pain.  . Naproxen Other (See Comments)    "Liver starts hurting" Diarrhea  . Nickel     Skin peeling  . Sulfa Antibiotics Hives and Swelling   Past Surgical History:  Procedure Laterality Date  . CHOLECYSTECTOMY    . ESOPHAGOGASTRODUODENOSCOPY    . LAPAROSCOPIC CHOLECYSTECTOMY SINGLE SITE WITH INTRAOPERATIVE CHOLANGIOGRAM N/A 03/15/2016   Procedure: LAPAROSCOPIC CHOLECYSTECTOMY SINGLE SITE WITH INTRAOPERATIVE CHOLANGIOGRAM;  Surgeon: Karie Soda, MD;  Location: [facility] OR;  Service: General;  Laterality: N/A;  . TONSILLECTOMY AND ADENOIDECTOMY  2013  . WISDOM TOOTH EXTRACTION     Family History  Problem Relation Age of Onset  . Alcohol abuse Mother   . Bipolar disorder Mother   . Cancer Father     sarcoma; passed away when pt was 8  . Tourette syndrome Maternal Aunt   . Alcohol abuse Maternal Aunt   . Hyperlipidemia Paternal Aunt   . Tourette syndrome Maternal Grandfather   . Heart disease Paternal Grandfather   . Alcohol abuse Maternal Uncle   . Suicidality Neg Hx    Social History   Social History  . Marital status: Married    Spouse name: N/A  . Number of children: 0  . Years of education: N/A   Occupational History  . analyst    Social History Main Topics  . Smoking status: Never Smoker  . Smokeless tobacco: Never Used  . Alcohol use 1.2 oz/week    2 Glasses of wine per week     Comment: occ  .  Drug use: No  . Sexual activity: Yes    Partners: Male    Birth control/ protection: None, Condom     Comment: Married   Other Topics Concern  . Not on file   Social History Narrative   - Married, no children.   Psychiatrist education.   - She works FT as a Patent attorney.   - Her aunt & uncle were her guardians when she was in HS. They live in [place], [place].    - She has a fraternal twin sister.    - Wears her seatbelt, smoke detectors in the home.   Allergies as of 07/14/2016      Reactions   Latex Swelling, Rash   Cranberry Juice Powder Other (See Comments)    Throat pain.   Naproxen Other (See Comments)   "Liver starts hurting" Diarrhea   Nickel    Skin peeling   Sulfa Antibiotics Hives, Swelling      Medication List       Accurate as of 07/14/16  8:34 AM. Always use your most recent med list.          amphetamine-dextroamphetamine 10 MG tablet Commonly known as:  ADDERALL Take 1 tablet (10 mg total) by mouth daily as needed. For focus/attentiveness   amphetamine-dextroamphetamine 25 MG 24 hr capsule Commonly known as:  ADDERALL XR Take 1 capsule by mouth daily.   aspirin-acetaminophen-caffeine 250-250-65 MG tablet Commonly known as:  EXCEDRIN MIGRAINE Take 1-2 tablets by mouth every 6 (six) hours as needed for headache.   clotrimazole-betamethasone cream Commonly known as:  LOTRISONE Apply 1 application topically 2 (two) times daily.   fluticasone 50 MCG/ACT nasal spray Commonly known as:  FLONASE SHAKE WELL AND USE 2 SPRAYS IN EACH NOSTRIL DAILY   ibuprofen 200 MG tablet Commonly known as:  ADVIL,MOTRIN Take 200 mg by mouth every 6 (six) hours as needed.   URIBEL 118 MG Caps Take 1 capsule by mouth 2 (two) times daily as needed. For urinary discomfort       US Abdomen Complete Result Date: 06/08/2016  IMPRESSION: 1. Question of hepatomegaly.  No focal hepatic abnormality. 2. The pancreas is moderately well seen with no abnormality noted. 3. No hydronephrosis. 4. The previously described probable atypical splenic hemangioma is not visualized by ultrasound. Electronically Signed   By: Dwyane Dee M.D.   On: 06/08/2016 10:45   ROS: 14 pt review of systems performed and negative (unless mentioned in an HPI)  Objective: BP 116/79 (BP Location: Right Arm, Patient Position: Sitting, Cuff Size: Normal)   Pulse 78   Temp 98.3 F (36.8 C)   Resp 20   Ht 5\' 6"  (1.676 m)   Wt 165 lb 8 oz (75.1 kg)   LMP 07/02/2016   SpO2 98%   BMI 26.71 kg/m  Gen: Afebrile. No acute distress. Nontoxic in appearance, well-developed,  well-nourished,  Pleasant caucasian female.  HENT: AT. [place]. Bilateral TM visualized and normal in appearance, normal external auditory canal. MMM, no oral lesions, adequate dentition. Bilateral nares within normal limits. Throat without erythema, ulcerations or exudates. no Cough on exam, no hoarseness on exam. Eyes:Pupils Equal Round Reactive to light, Extraocular movements intact,  Conjunctiva without redness, discharge or icterus. Neck/lymp/endocrine: Supple,no lymphadenopathy, no thyromegaly CV: RRR no murmur, no edema, +2/4 P posterior tibialis pulses.  Chest: CTAB, no wheeze, rhonchi or crackles. Normal Respiratory effort. good Air movement. Abd: Soft. flat. NTND. BS presnet. no Masses palpated. Mildly larger, but within normal  limits hepatosplenomegaly. No rebound tenderness or guarding. Skin: no rashes, purpura or petechiae. Warm and well-perfused. Skin intact. Neuro/Msk:  Normal gait. PERLA. EOMi. Alert. Oriented x3.  Cranial nerves II through XII intact. Muscle strength 5/5 upper/lower extremity. DTRs equal bilaterally. Psych: Normal affect, dress and demeanor. Normal speech. Normal thought content and judgment.   Assessment/plan: Maryjean Kamily H Carino is a 26 y.o. female present for CPE.  Encounter for preventive health examination Patient was encouraged to exercise greater than 150 minutes a week. Patient was encouraged to choose a diet filled with fresh fruits and vegetables, and lean meats. AVS provided to patient today for education/recommendation on gender specific health and safety maintenance. - PAP will need completed in August. She is uncertain if she wants a GYN or come here at this time.  - all other health maintenance UTD. Thyroid disorder screen - TSH Diabetes mellitus screening - Hemoglobin A1c Screening cholesterol level - pt not fasting, will collect LDL only today, she is agreeable.  - Direct LDL   Return in about 1 year (around 07/14/2017) for CPE.  Electronically  signed by: Felix Pacinienee Kuneff, DO [facility]

## 2016-07-14 NOTE — Progress Notes (Signed)
Medication corrections

## 2016-07-14 NOTE — Patient Instructions (Signed)
Have fun in [place].!!!  We will call you with lab results as soon as we get them.   Make sure to get PAP in August.    Health Maintenance, Female Adopting a healthy lifestyle and getting preventive care can go a long way to promote health and wellness. Talk with your health care provider about what schedule of regular examinations is right for you. This is a good chance for you to check in with your provider about disease prevention and staying healthy. In between checkups, there are plenty of things you can do on your own. Experts have done a lot of research about which lifestyle changes and preventive measures are most likely to keep you healthy. Ask your health care provider for more information. Weight and diet Eat a healthy diet  Be sure to include plenty of vegetables, fruits, low-fat dairy products, and lean protein.  Do not eat a lot of foods high in solid fats, added sugars, or salt.  Get regular exercise. This is one of the most important things you can do for your health.  Most adults should exercise for at least 150 minutes each week. The exercise should increase your heart rate and make you sweat (moderate-intensity exercise).  Most adults should also do strengthening exercises at least twice a week. This is in addition to the moderate-intensity exercise. Maintain a healthy weight  Body mass index (BMI) is a measurement that can be used to identify possible weight problems. It estimates body fat based on height and weight. Your health care provider can help determine your BMI and help you achieve or maintain a healthy weight.  For females 64 years of age and older:  A BMI below 18.5 is considered underweight.  A BMI of 18.5 to 24.9 is normal.  A BMI of 25 to 29.9 is considered overweight.  A BMI of 30 and above is considered obese. Watch levels of cholesterol and blood lipids  You should start having your blood tested for lipids and cholesterol at 26 years of age, then  have this test every 5 years.  You may need to have your cholesterol levels checked more often if:  Your lipid or cholesterol levels are high.  You are older than 26 years of age.  You are at high risk for heart disease. Cancer screening Lung Cancer  Lung cancer screening is recommended for adults 26-84 years old who are at high risk for lung cancer because of a history of smoking.  A yearly low-dose CT scan of the lungs is recommended for people who:  Currently smoke.  Have quit within the past 15 years.  Have at least a 30-pack-year history of smoking. A pack year is smoking an average of one pack of cigarettes a day for 1 year.  Yearly screening should continue until it has been 15 years since you quit.  Yearly screening should stop if you develop a health problem that would prevent you from having lung cancer treatment. Breast Cancer  Practice breast self-awareness. This means understanding how your breasts normally appear and feel.  It also means doing regular breast self-exams. Let your health care provider know about any changes, no matter how small.  If you are in your 20s or 30s, you should have a clinical breast exam (CBE) by a health care provider every 1-3 years as part of a regular health exam.  If you are 71 or older, have a CBE every year. Also consider having a breast X-ray (mammogram) every year.  If you have a family history of breast cancer, talk to your health care provider about genetic screening.  If you are at high risk for breast cancer, talk to your health care provider about having an MRI and a mammogram every year.  Breast cancer gene (BRCA) assessment is recommended for women who have family members with BRCA-related cancers. BRCA-related cancers include:  Breast.  Ovarian.  Tubal.  Peritoneal cancers.  Results of the assessment will determine the need for genetic counseling and BRCA1 and BRCA2 testing. Cervical Cancer  Your health care  provider may recommend that you be screened regularly for cancer of the pelvic organs (ovaries, uterus, and vagina). This screening involves a pelvic examination, including checking for microscopic changes to the surface of your cervix (Pap test). You may be encouraged to have this screening done every 3 years, beginning at age 69.  For women ages 72-65, health care providers may recommend pelvic exams and Pap testing every 3 years, or they may recommend the Pap and pelvic exam, combined with testing for human papilloma virus (HPV), every 5 years. Some types of HPV increase your risk of cervical cancer. Testing for HPV may also be done on women of any age with unclear Pap test results.  Other health care providers may not recommend any screening for nonpregnant women who are considered low risk for pelvic cancer and who do not have symptoms. Ask your health care provider if a screening pelvic exam is right for you.  If you have had past treatment for cervical cancer or a condition that could lead to cancer, you need Pap tests and screening for cancer for at least 20 years after your treatment. If Pap tests have been discontinued, your risk factors (such as having a new sexual partner) need to be reassessed to determine if screening should resume. Some women have medical problems that increase the chance of getting cervical cancer. In these cases, your health care provider may recommend more frequent screening and Pap tests. Colorectal Cancer  This type of cancer can be detected and often prevented.  Routine colorectal cancer screening usually begins at 26 years of age and continues through 26 years of age.  Your health care provider may recommend screening at an earlier age if you have risk factors for colon cancer.  Your health care provider may also recommend using home test kits to check for hidden blood in the stool.  A small camera at the end of a tube can be used to examine your colon directly  (sigmoidoscopy or colonoscopy). This is done to check for the earliest forms of colorectal cancer.  Routine screening usually begins at age 37.  Direct examination of the colon should be repeated every 5-10 years through 26 years of age. However, you may need to be screened more often if early forms of precancerous polyps or small growths are found. Skin Cancer  Check your skin from head to toe regularly.  Tell your health care provider about any new moles or changes in moles, especially if there is a change in a mole's shape or color.  Also tell your health care provider if you have a mole that is larger than the size of a pencil eraser.  Always use sunscreen. Apply sunscreen liberally and repeatedly throughout the day.  Protect yourself by wearing long sleeves, pants, a wide-brimmed hat, and sunglasses whenever you are outside. Heart disease, diabetes, and high blood pressure  High blood pressure causes heart disease and increases the risk  of stroke. High blood pressure is more likely to develop in:  People who have blood pressure in the high end of the normal range (130-139/85-89 mm Hg).  People who are overweight or obese.  People who are African American.  If you are 59-44 years of age, have your blood pressure checked every 3-5 years. If you are 10 years of age or older, have your blood pressure checked every year. You should have your blood pressure measured twice-once when you are at a hospital or clinic, and once when you are not at a hospital or clinic. Record the average of the two measurements. To check your blood pressure when you are not at a hospital or clinic, you can use:  An automated blood pressure machine at a pharmacy.  A home blood pressure monitor.  If you are between 40 years and 36 years old, ask your health care provider if you should take aspirin to prevent strokes.  Have regular diabetes screenings. This involves taking a blood sample to check your  fasting blood sugar level.  If you are at a normal weight and have a low risk for diabetes, have this test once every three years after 26 years of age.  If you are overweight and have a high risk for diabetes, consider being tested at a younger age or more often. Preventing infection Hepatitis B  If you have a higher risk for hepatitis B, you should be screened for this virus. You are considered at high risk for hepatitis B if:  You were born in a country where hepatitis B is common. Ask your health care provider which countries are considered high risk.  Your parents were born in a high-risk country, and you have not been immunized against hepatitis B (hepatitis B vaccine).  You have HIV or AIDS.  You use needles to inject street drugs.  You live with someone who has hepatitis B.  You have had sex with someone who has hepatitis B.  You get hemodialysis treatment.  You take certain medicines for conditions, including cancer, organ transplantation, and autoimmune conditions. Hepatitis C  Blood testing is recommended for:  Everyone born from [birth year] through 1965.  Anyone with known risk factors for hepatitis C. Sexually transmitted infections (STIs)  You should be screened for sexually transmitted infections (STIs) including gonorrhea and chlamydia if:  You are sexually active and are younger than 26 years of age.  You are older than 26 years of age and your health care provider tells you that you are at risk for this type of infection.  Your sexual activity has changed since you were last screened and you are at an increased risk for chlamydia or gonorrhea. Ask your health care provider if you are at risk.  If you do not have HIV, but are at risk, it may be recommended that you take a prescription medicine daily to prevent HIV infection. This is called pre-exposure prophylaxis (PrEP). You are considered at risk if:  You are sexually active and do not regularly use condoms or  know the HIV status of your partner(s).  You take drugs by injection.  You are sexually active with a partner who has HIV. Talk with your health care provider about whether you are at high risk of being infected with HIV. If you choose to begin PrEP, you should first be tested for HIV. You should then be tested every 3 months for as long as you are taking PrEP. Pregnancy  If you are  premenopausal and you may become pregnant, ask your health care provider about preconception counseling.  If you may become pregnant, take 400 to 800 micrograms (mcg) of folic acid every day.  If you want to prevent pregnancy, talk to your health care provider about birth control (contraception). Osteoporosis and menopause  Osteoporosis is a disease in which the bones lose minerals and strength with aging. This can result in serious bone fractures. Your risk for osteoporosis can be identified using a bone density scan.  If you are 37 years of age or older, or if you are at risk for osteoporosis and fractures, ask your health care provider if you should be screened.  Ask your health care provider whether you should take a calcium or vitamin D supplement to lower your risk for osteoporosis.  Menopause may have certain physical symptoms and risks.  Hormone replacement therapy may reduce some of these symptoms and risks. Talk to your health care provider about whether hormone replacement therapy is right for you. Follow these instructions at home:  Schedule regular health, dental, and eye exams.  Stay current with your immunizations.  Do not use any tobacco products including cigarettes, chewing tobacco, or electronic cigarettes.  If you are pregnant, do not drink alcohol.  If you are breastfeeding, limit how much and how often you drink alcohol.  Limit alcohol intake to no more than 1 drink per day for nonpregnant women. One drink equals 12 ounces of beer, 5 ounces of wine, or 1 ounces of hard  liquor.  Do not use street drugs.  Do not share needles.  Ask your health care provider for help if you need support or information about quitting drugs.  Tell your health care provider if you often feel depressed.  Tell your health care provider if you have ever been abused or do not feel safe at home. This information is not intended to replace advice given to you by your health care provider. Make sure you discuss any questions you have with your health care provider. Document Released: 10/31/2010 Document Revised: 09/23/2015 Document Reviewed: 01/19/2015 Elsevier Interactive Patient Education  2017 [employer].

## 2016-07-15 ENCOUNTER — Telehealth: Payer: Self-pay | Admitting: Family Medicine

## 2016-07-15 LAB — HEMOGLOBIN A1C
Hgb A1c MFr Bld: 4.8 % (ref <5.7)
Mean Plasma Glucose: 91 mg/dL

## 2016-07-15 NOTE — Telephone Encounter (Signed)
Please call pt: - all labs are normal. 

## 2016-07-17 NOTE — Telephone Encounter (Signed)
Left detailed message on voice mail, Okay per DPR.

## 2016-07-18 ENCOUNTER — Ambulatory Visit (HOSPITAL_COMMUNITY): Payer: Self-pay | Admitting: Licensed Clinical Social Worker

## 2016-07-25 ENCOUNTER — Ambulatory Visit (HOSPITAL_COMMUNITY): Payer: Self-pay | Admitting: Licensed Clinical Social Worker

## 2016-07-27 ENCOUNTER — Ambulatory Visit (INDEPENDENT_AMBULATORY_CARE_PROVIDER_SITE_OTHER): Payer: BLUE CROSS/BLUE SHIELD | Admitting: Psychiatry

## 2016-07-27 ENCOUNTER — Encounter (HOSPITAL_COMMUNITY): Payer: Self-pay | Admitting: Psychiatry

## 2016-07-27 VITALS — BP 118/68 | HR 75 | Ht 65.0 in | Wt 169.2 lb

## 2016-07-27 DIAGNOSIS — F952 Tourette's disorder: Secondary | ICD-10-CM | POA: Diagnosis not present

## 2016-07-27 DIAGNOSIS — F988 Other specified behavioral and emotional disorders with onset usually occurring in childhood and adolescence: Secondary | ICD-10-CM

## 2016-07-27 DIAGNOSIS — Z818 Family history of other mental and behavioral disorders: Secondary | ICD-10-CM

## 2016-07-27 DIAGNOSIS — Z811 Family history of alcohol abuse and dependence: Secondary | ICD-10-CM

## 2016-07-27 DIAGNOSIS — Z79899 Other long term (current) drug therapy: Secondary | ICD-10-CM | POA: Diagnosis not present

## 2016-07-27 DIAGNOSIS — F900 Attention-deficit hyperactivity disorder, predominantly inattentive type: Secondary | ICD-10-CM

## 2016-07-27 DIAGNOSIS — F502 Bulimia nervosa: Secondary | ICD-10-CM

## 2016-07-27 MED ORDER — AMPHETAMINE-DEXTROAMPHET ER 25 MG PO CP24: 25.0000 mg | ORAL_CAPSULE | Freq: Every day | ORAL | 0 refills | Status: DC

## 2016-07-27 MED ORDER — AMPHETAMINE-DEXTROAMPHETAMINE 10 MG PO TABS: 10.0000 mg | ORAL_TABLET | Freq: Every day | ORAL | 0 refills | Status: DC

## 2016-07-27 MED ORDER — AMPHETAMINE-DEXTROAMPHETAMINE 10 MG PO TABS: 10.0000 mg | ORAL_TABLET | Freq: Every day | ORAL | 0 refills | Status: DC | PRN

## 2016-07-27 NOTE — Progress Notes (Signed)
[facility] MD/PA/NP OP Progress Note  07/27/2016 9:03 AM Cindy Jones  MRN:  [id number]  Chief Complaint:  Chief Complaint    Follow-up       HPI: Pt denies depression. Denies anhedonia, isolation, crying spells, low motivation, poor hygiene, worthlessness and hopelessness. Denies SI/HI.  Sleep, appetite, energy are good. She is not overeating. Pt has been a little more aware of her weight because she is in 2 weddings in June and Sept. States she is not letting her appetite or weight rule her life.  Tics are under control as long as she doesn't focus on it. She notices it when Adderall wears off or is stressed.  ADHD is well controlled with Adderall. Pt is no longer forgetting things and is using her planner.  Taking meds as prescribed and denies SE.      Visit Diagnosis:    ICD-9-CM ICD-10-CM   1. Tourette's disorder 307.23 F95.2   2. ADD (attention deficit disorder) without hyperactivity 314.00 F98.8 amphetamine-dextroamphetamine (ADDERALL XR) 25 MG 24 hr capsule     amphetamine-dextroamphetamine (ADDERALL) 10 MG tablet     amphetamine-dextroamphetamine (ADDERALL XR) 25 MG 24 hr capsule     amphetamine-dextroamphetamine (ADDERALL XR) 25 MG 24 hr capsule     amphetamine-dextroamphetamine (ADDERALL) 10 MG tablet     amphetamine-dextroamphetamine (ADDERALL) 10 MG tablet    Past Psychiatric History: see H&P  Past Medical History:  Past Medical History:  Diagnosis Date  . Anxiety   . Asthma    as a child  . Binge eating disorder   . Dysrhythmia    " I feel it about once a month"  . Frequent headaches   . Migraines   . PID (acute pelvic inflammatory disease)   . Seasonal allergies   . Tourette disorder   . Urinary tract bacterial infections     Past Surgical History:  Procedure Laterality Date  . CHOLECYSTECTOMY    . ESOPHAGOGASTRODUODENOSCOPY    . LAPAROSCOPIC CHOLECYSTECTOMY SINGLE SITE WITH INTRAOPERATIVE CHOLANGIOGRAM N/A 03/15/2016   Procedure: LAPAROSCOPIC  CHOLECYSTECTOMY SINGLE SITE WITH INTRAOPERATIVE CHOLANGIOGRAM;  Surgeon: Karie Soda, MD;  Location[facility];  Service: General;  Laterality: N/A;  . TONSILLECTOMY AND ADENOIDECTOMY  2013  . WISDOM TOOTH EXTRACTION      Family Psychiatric History:   Family History  Problem Relation Age of Onset  . Alcohol abuse Mother   . Bipolar disorder Mother   . Cancer Father     sarcoma; passed away when pt was 8  . Tourette syndrome Maternal Aunt   . Alcohol abuse Maternal Aunt   . Hyperlipidemia Paternal Aunt   . Tourette syndrome Maternal Grandfather   . Heart disease Paternal Grandfather   . Alcohol abuse Maternal Uncle   . Suicidality Neg Hx     Social History:  Social History   Social History  . Marital status: Married    Spouse name: N/A  . Number of children: 0  . Years of education: N/A   Occupational History  . analyst    Social History Main Topics  . Smoking status: Never Smoker  . Smokeless tobacco: Never Used  . Alcohol use 1.2 oz/week    2 Glasses of wine per week     Comment: occ  . Drug use: No  . Sexual activity: Yes    Partners: Male    Birth control/ protection: None, Condom     Comment: Married   Other Topics Concern  . None   Social History  Narrative   - Married, no children.   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   - She works FT as a Patent attorneyloan broker.   - Her aunt & uncle were her guardians when she was in HS. [place], [place].    - She has a fraternal twin sister.    - Wears her seatbelt, smoke detectors in the home.    Allergies:  Allergies  Allergen Reactions  . Latex Swelling and Rash  . Cranberry Juice Powder Other (See Comments)    Throat pain.  . Naproxen Other (See Comments)    "Liver starts hurting" Diarrhea  . Nickel     Skin peeling  . Sulfa Antibiotics Hives and Swelling    Metabolic Disorder Labs: Lab Results  Component Value Date   HGBA1C 4.8 07/14/2016   MPG 91 07/14/2016   No results found for: PROLACTIN No results  found for: CHOL, TRIG, HDL, CHOLHDL, VLDL, LDLCALC   Current Medications: Current Outpatient Prescriptions  Medication Sig Dispense Refill  . amphetamine-dextroamphetamine (ADDERALL XR) 25 MG 24 hr capsule Take 1 capsule by mouth daily. 30 capsule 0  . aspirin-acetaminophen-caffeine (EXCEDRIN MIGRAINE) 250-250-65 MG per tablet Take 1-2 tablets by mouth every 6 (six) hours as needed for headache.     . clotrimazole-betamethasone (LOTRISONE) cream Apply 1 application topically 2 (two) times daily. (Patient taking differently: Apply 1 application topically 2 (two) times daily as needed (for itching skin). ) 45 g 1  . Meth-Hyo-M Bl-Na Phos-Ph Sal (URIBEL) 118 MG CAPS Take 1 capsule by mouth 2 (two) times daily as needed. For urinary discomfort  6   No current facility-administered medications for this visit.       Musculoskeletal: Strength & Muscle Tone: within normal limits Gait & Station: normal Patient leans: N/A  Psychiatric Specialty Exam: Review of Systems  Respiratory: Negative for cough, shortness of breath and wheezing.   Neurological: Negative for dizziness, tremors, sensory change, seizures, loss of consciousness and headaches.  Endo/Heme/Allergies: Negative for environmental allergies and polydipsia. Does not bruise/bleed easily.  Psychiatric/Behavioral: Negative for depression, hallucinations, substance abuse and suicidal ideas. The patient is not nervous/anxious and does not have insomnia.     Blood pressure 118/68, pulse 75, height 5\' 5"  (1.651 m), weight 169 lb 3.2 oz (76.7 kg), last menstrual period 07/02/2016.Body mass index is 28.16 kg/m.  General Appearance: Fairly Groomed  Eye Contact:  Good  Speech:  Clear and Coherent and Normal Rate  Volume:  Normal  Mood:  Euthymic  Affect:  Full Range  Thought Process:  Goal Directed and Descriptions of Associations: Intact  Orientation:  Full (Time, Place, and Person)  Thought Content: Logical   Suicidal Thoughts:  No   Homicidal Thoughts:  No  Memory:  Immediate;   Good Recent;   Good Remote;   Good  Judgement:  Good  Insight:  Good  Psychomotor Activity:  Normal  Concentration:  Concentration: Good and Attention Span: Good  Recall:  Good  Fund of Knowledge: Good  Language: Good  Akathisia:  No  Handed:  Right  AIMS (if indicated):  n/a  Assets:  Communication Skills Desire for Improvement  ADL's:  Intact  Cognition: WNL  Sleep:  good     Treatment Plan Summary:Medication management and Plan see below  Assessment: ADHD-inattentive type; Tourettes disorder; Bulimia Nervousa; MDD- resolved   Medication management with supportive therapy. Risks/benefits and SE of the medication discussed. Pt verbalized understanding and verbal consent obtained for treatment.  Affirm with the  patient that the medications are taken as ordered. Patient expressed understanding of how their medications were to be used.   The risk of un-intended pregnancy is low  based on the fact that pt reports she is using condoms. Pt is aware that these meds carry a teratogenic risk. Pt will discuss plan of action if she does or plans to become pregnant in the future.   Meds: continue Adderall XR 25mg  po qAM and Adderall 10mg  qLunch for ADD   Labs: none    Therapy: brief supportive therapy provided. Discussed psychosocial stressors in detail.     Consultations: none  Pt denies SI and is at an acute low risk for suicide. Patient told to call clinic if any problems occur. Patient advised to go to ER if they should develop SI/HI, side effects, or if symptoms worsen. Has crisis numbers to call if needed. Pt verbalized understanding.  F/up in 3 months or sooner if needed  Oletta Darter, MD 07/27/2016, 9:03 AM

## 2016-08-01 ENCOUNTER — Ambulatory Visit (HOSPITAL_COMMUNITY): Payer: Self-pay | Admitting: Licensed Clinical Social Worker

## 2016-10-19 ENCOUNTER — Ambulatory Visit (INDEPENDENT_AMBULATORY_CARE_PROVIDER_SITE_OTHER): Payer: BLUE CROSS/BLUE SHIELD | Admitting: Psychiatry

## 2016-10-19 ENCOUNTER — Encounter (HOSPITAL_COMMUNITY): Payer: Self-pay | Admitting: Psychiatry

## 2016-10-19 DIAGNOSIS — F502 Bulimia nervosa: Secondary | ICD-10-CM | POA: Diagnosis not present

## 2016-10-19 DIAGNOSIS — F952 Tourette's disorder: Secondary | ICD-10-CM

## 2016-10-19 DIAGNOSIS — Z811 Family history of alcohol abuse and dependence: Secondary | ICD-10-CM

## 2016-10-19 DIAGNOSIS — F900 Attention-deficit hyperactivity disorder, predominantly inattentive type: Secondary | ICD-10-CM

## 2016-10-19 DIAGNOSIS — Z818 Family history of other mental and behavioral disorders: Secondary | ICD-10-CM

## 2016-10-19 DIAGNOSIS — F988 Other specified behavioral and emotional disorders with onset usually occurring in childhood and adolescence: Secondary | ICD-10-CM

## 2016-10-19 DIAGNOSIS — Z810 Family history of intellectual disabilities: Secondary | ICD-10-CM

## 2016-10-19 MED ORDER — AMPHETAMINE-DEXTROAMPHET ER 25 MG PO CP24: 25.0000 mg | ORAL_CAPSULE | Freq: Every day | ORAL | 0 refills | Status: DC

## 2016-10-19 MED ORDER — AMPHETAMINE-DEXTROAMPHETAMINE 10 MG PO TABS: 10.0000 mg | ORAL_TABLET | Freq: Every day | ORAL | 0 refills | Status: DC

## 2016-10-19 MED ORDER — AMPHETAMINE-DEXTROAMPHETAMINE 10 MG PO TABS: 10.0000 mg | ORAL_TABLET | Freq: Every day | ORAL | 0 refills | Status: DC | PRN

## 2016-10-19 NOTE — Progress Notes (Signed)
BH MD/PA/NP OP Progress Note  10/19/2016 8:18 AM MILDERD MANOCCHIO  MRN:  [id number]  Chief Complaint:  Chief Complaint    Follow-up     HPI: Pt states she is doing well. States she got a new dog. At her husband's suggestion she got a new dog. He has helped to decrease her and stress and anxiety. She is having less intense panic like symptoms.  Pt wants to make him a therapeutic dog.   States Tourette's is well controlled with Adderall. She has some symptoms in the morning or when very stressed.  States she is no longer forgetting things since Adderall dose was increased. She is able to complete her work on time.  Pt states she is sleeping. Pt is no longer over eating and her weight is steady. Energy is good and she is exercising.  Pt is feeling upbeat and denies depression. Denies anhedonia. Denies SI/HI.  Taking meds as prescribed and denies SE.   Visit Diagnosis:    ICD-10-CM   1. ADD (attention deficit disorder) without hyperactivity F98.8 amphetamine-dextroamphetamine (ADDERALL) 10 MG tablet    amphetamine-dextroamphetamine (ADDERALL XR) 25 MG 24 hr capsule    amphetamine-dextroamphetamine (ADDERALL XR) 25 MG 24 hr capsule    amphetamine-dextroamphetamine (ADDERALL XR) 25 MG 24 hr capsule    amphetamine-dextroamphetamine (ADDERALL) 10 MG tablet    amphetamine-dextroamphetamine (ADDERALL) 10 MG tablet      Past Psychiatric History:  Anxiety: Yes Bipolar Disorder: No Depression: Yes Mania: No Psychosis: No Schizophrenia: No Personality Disorder: No Hospitalization for psychiatric illness: No History of Electroconvulsive Shock Therapy: No Prior Suicide Attempts: No  Past Medical History:  Past Medical History:  Diagnosis Date  . Anxiety   . Asthma    as a child  . Binge eating disorder   . Dysrhythmia    " I feel it about once a month"  . Frequent headaches   . Migraines   . PID (acute pelvic inflammatory disease)   . Seasonal allergies   . Tourette disorder    . Urinary tract bacterial infections     Past Surgical History:  Procedure Laterality Date  . CHOLECYSTECTOMY    . ESOPHAGOGASTRODUODENOSCOPY    . LAPAROSCOPIC CHOLECYSTECTOMY SINGLE SITE WITH INTRAOPERATIVE CHOLANGIOGRAM N/A 03/15/2016   Procedure: LAPAROSCOPIC CHOLECYSTECTOMY SINGLE SITE WITH INTRAOPERATIVE CHOLANGIOGRAM;  Surgeon: Karie Soda, MD;  Location: [facility] OR;  Service: General;  Laterality: N/A;  . TONSILLECTOMY AND ADENOIDECTOMY  2013  . WISDOM TOOTH EXTRACTION      Family Psychiatric History: Family History  Problem Relation Age of Onset  . Alcohol abuse Mother   . Bipolar disorder Mother   . Cancer Father        sarcoma; passed away when pt was 8  . Tourette syndrome Maternal Aunt   . Alcohol abuse Maternal Aunt   . Hyperlipidemia Paternal Aunt   . Tourette syndrome Maternal Grandfather   . Heart disease Paternal Grandfather   . Alcohol abuse Maternal Uncle   . Suicidality Neg Hx     Social History:  Social History   Social History  . Marital status: Married    Spouse name: N/A  . Number of children: 0  . Years of education: N/A   Occupational History  . analyst    Social History Main Topics  . Smoking status: Never Smoker  . Smokeless tobacco: Never Used  . Alcohol use 0.6 oz/week    1 Glasses of wine per week     Comment: occ  .  Drug use: No  . Sexual activity: Yes    Partners: Male    Birth control/ protection: None, Condom     Comment: Married   Other Topics Concern  . None   Social History Narrative   - Married, no children.   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   - She works FT as a Patent attorneyloan broker.   - Her aunt & uncle were her guardians when she was in HS. [place] in [place], [place]    - She has a fraternal twin sister.    - Wears her seatbelt, smoke detectors in the home.    Allergies:  Allergies  Allergen Reactions  . Latex Swelling and Rash  . Cranberry Juice Powder Other (See Comments)    Throat pain.  . Naproxen Other (See  Comments)    "Liver starts hurting" Diarrhea  . Nickel     Skin peeling  . Sulfa Antibiotics Hives and Swelling    Metabolic Disorder Labs: Lab Results  Component Value Date   HGBA1C 4.8 07/14/2016   MPG 91 07/14/2016   No results found for: PROLACTIN No results found for: CHOL, TRIG, HDL, CHOLHDL, VLDL, LDLCALC   Current Medications: Current Outpatient Prescriptions  Medication Sig Dispense Refill  . amphetamine-dextroamphetamine (ADDERALL XR) 25 MG 24 hr capsule Take 1 capsule by mouth daily. 30 capsule 0  . amphetamine-dextroamphetamine (ADDERALL XR) 25 MG 24 hr capsule Take 1 capsule by mouth daily. 30 capsule 0  . amphetamine-dextroamphetamine (ADDERALL XR) 25 MG 24 hr capsule Take 1 capsule by mouth daily. 30 capsule 0  . amphetamine-dextroamphetamine (ADDERALL) 10 MG tablet Take 1 tablet (10 mg total) by mouth daily as needed. For focus/attentiveness 30 tablet 0  . amphetamine-dextroamphetamine (ADDERALL) 10 MG tablet Take 1 tablet (10 mg total) by mouth daily. 30 tablet 0  . amphetamine-dextroamphetamine (ADDERALL) 10 MG tablet Take 1 tablet (10 mg total) by mouth daily. 30 tablet 0  . clotrimazole-betamethasone (LOTRISONE) cream Apply 1 application topically 2 (two) times daily. (Patient taking differently: Apply 1 application topically 2 (two) times daily as needed (for itching skin). ) 45 g 1  . Meth-Hyo-M Bl-Na Phos-Ph Sal (URIBEL) 118 MG CAPS Take 1 capsule by mouth 2 (two) times daily as needed. For urinary discomfort  6  . aspirin-acetaminophen-caffeine (EXCEDRIN MIGRAINE) 250-250-65 MG per tablet Take 1-2 tablets by mouth every 6 (six) hours as needed for headache.      No current facility-administered medications for this visit.     Musculoskeletal: Strength & Muscle Tone: within normal limits Gait & Station: normal Patient leans: N/A  Psychiatric Specialty Exam: Review of Systems  Gastrointestinal: Negative for abdominal pain, heartburn, nausea and vomiting.   Neurological: Negative for dizziness, tremors, sensory change and headaches.  Psychiatric/Behavioral: Negative for depression, hallucinations, substance abuse and suicidal ideas. The patient is nervous/anxious. The patient does not have insomnia.     Blood pressure 106/68, pulse 94, height 5' 5.5" (1.664 m), weight 170 lb (77.1 kg), SpO2 99 %.Body mass index is 27.86 kg/m.  General Appearance: Fairly Groomed  Eye Contact:  Good  Speech:  Clear and Coherent and Normal Rate  Volume:  Normal  Mood:  Euthymic  Affect:  Full Range  Thought Process:  Goal Directed and Descriptions of Associations: Intact  Orientation:  Full (Time, Place, and Person)  Thought Content: Logical   Suicidal Thoughts:  No  Homicidal Thoughts:  No  Memory:  Immediate;   Good Recent;   Good Remote;   Good  Judgement:  Good  Insight:  Good  Psychomotor Activity:  Normal  Concentration:  Concentration: Good and Attention Span: Good  Recall:  Good  Fund of Knowledge: Good  Language: Good  Akathisia:  No  Handed:  Right  AIMS (if indicated):  n/a  Assets:  Communication Skills Desire for Improvement Financial Resources/Insurance Housing Intimacy Leisure Time Physical Health Resilience Social Support Talents/Skills Transportation Vocational/Educational  ADL's:  Intact  Cognition: WNL  Sleep:  good     Treatment Plan Summary:Medication management  Assessment: ADHD-inattentive type; Tourette's disorder; Bulimia Nervosa; MDD-resolved   Medication management with supportive therapy. Risks/benefits and SE of the medication discussed. Pt verbalized understanding and verbal consent obtained for treatment.  Affirm with the patient that the medications are taken as ordered. Patient expressed understanding of how their medications were to be used.   Meds: Adderall XR 25mg  po qAM for ADHD Adderall 10mg  po qLunch for ADHD   Labs: none  Therapy: brief supportive therapy provided. Discussed psychosocial  stressors in detail.     Consultations:  None  Pt denies SI and is at an acute low risk for suicide. Patient told to call clinic if any problems occur. Patient advised to go to ER if they should develop SI/HI, side effects, or if symptoms worsen. Has crisis numbers to call if needed. Pt verbalized understanding.  F/up in 3 months or sooner if needed   Oletta Darter, MD 10/19/2016, 8:18 AM

## 2016-10-24 ENCOUNTER — Ambulatory Visit (INDEPENDENT_AMBULATORY_CARE_PROVIDER_SITE_OTHER): Payer: BLUE CROSS/BLUE SHIELD | Admitting: Family Medicine

## 2016-10-24 ENCOUNTER — Encounter: Payer: Self-pay | Admitting: Family Medicine

## 2016-10-24 VITALS — BP 113/78 | HR 71 | Temp 98.7°F | Resp 20 | Wt 169.5 lb

## 2016-10-24 DIAGNOSIS — J320 Chronic maxillary sinusitis: Secondary | ICD-10-CM

## 2016-10-24 DIAGNOSIS — R591 Generalized enlarged lymph nodes: Secondary | ICD-10-CM

## 2016-10-24 MED ORDER — AMOXICILLIN-POT CLAVULANATE 875-125 MG PO TABS: 1.0000 | ORAL_TABLET | Freq: Two times a day (BID) | ORAL | 0 refills | Status: DC

## 2016-10-24 NOTE — Patient Instructions (Signed)
Start the Augmentin every 12 hour for 10 days with some food.  Advil for pain or swelling.  Rest, hydrate.  Try flonase and/or zyrtec/claritin.     Sinusitis, Adult Sinusitis is soreness and inflammation of your sinuses. Sinuses are hollow spaces in the bones around your face. They are located:  Around your eyes.  In the middle of your forehead.  Behind your nose.  In your cheekbones.  Your sinuses and nasal passages are lined with a stringy fluid (mucus). Mucus normally drains out of your sinuses. When your nasal tissues get inflamed or swollen, the mucus can get trapped or blocked so air cannot flow through your sinuses. This lets bacteria, viruses, and funguses grow, and that leads to infection. Follow these instructions at home: Medicines  Take, use, or apply over-the-counter and prescription medicines only as told by your doctor. These may include nasal sprays.  If you were prescribed an antibiotic medicine, take it as told by your doctor. Do not stop taking the antibiotic even if you start to feel better. Hydrate and Humidify  Drink enough water to keep your pee (urine) clear or pale yellow.  Use a cool mist humidifier to keep the humidity level in your home above 50%.  Breathe in steam for 10-15 minutes, 3-4 times a day or as told by your doctor. You can do this in the bathroom while a hot shower is running.  Try not to spend time in cool or dry air. Rest  Rest as much as possible.  Sleep with your head raised (elevated).  Make sure to get enough sleep each night. General instructions  Put a warm, moist washcloth on your face 3-4 times a day or as told by your doctor. This will help with discomfort.  Wash your hands often with soap and water. If there is no soap and water, use hand sanitizer.  Do not smoke. Avoid being around people who are smoking (secondhand smoke).  Keep all follow-up visits as told by your doctor. This is important. Contact a doctor  if:  You have a fever.  Your symptoms get worse.  Your symptoms do not get better within 10 days. Get help right away if:  You have a very bad headache.  You cannot stop throwing up (vomiting).  You have pain or swelling around your face or eyes.  You have trouble seeing.  You feel confused.  Your neck is stiff.  You have trouble breathing. This information is not intended to replace advice given to you by your health care provider. Make sure you discuss any questions you have with your health care provider. Document Released: 10/04/2007 Document Revised: 12/12/2015 Document Reviewed: 02/10/2015 Elsevier Interactive Patient Education  [employer]2018 Elsevier Inc.

## 2016-10-24 NOTE — Progress Notes (Signed)
Cindy Jones , 01-Mar-1991, 26 y.o., female MRN: [id number] Patient Care Team    Relationship Specialty Notifications Start End  Natalia Leatherwood, DO PCP - General Family Medicine  07/14/15   Karie Soda, MD Consulting Physician General Surgery  01/31/16   Armbruster, Reeves Forth, MD Consulting Physician Gastroenterology  01/31/16     Chief Complaint  Patient presents with  . Sinusitis    congestion,facial pressure,swollen glands,ear pain x 3 days     Subjective:   Pt presents today with a 3-4 day history of nasal congestion, sinus and ear pressure. She endorses a rather swollen and tender gland under her chin and fatigue. She was in a wedding this past weekend and was outdoors in the heat and then cool air. She denies objective fever but had felt warm. She denies nausea, vomit, diarrhea, rash or cough. She reports her lymph node under her chin becomes swollen and tender anytime she gets head cold, and resolves after abx treatment. She denies mouth/teeth pain.   Depression screen [facility] 2/9 07/14/2016 07/04/2015 05/10/2015 11/06/2014  Decreased Interest 0 0 0 0  Down, Depressed, Hopeless 0 0 0 0  PHQ - 2 Score 0 0 0 0    Allergies  Allergen Reactions  . Latex Swelling and Rash  . Cranberry Juice Powder Other (See Comments)    Throat pain.  . Naproxen Other (See Comments)    "Liver starts hurting" Diarrhea  . Nickel     Skin peeling  . Sulfa Antibiotics Hives and Swelling   Social History  Substance Use Topics  . Smoking status: Never Smoker  . Smokeless tobacco: Never Used  . Alcohol use 0.6 oz/week    1 Glasses of wine per week     Comment: occ   Past Medical History:  Diagnosis Date  . Anxiety   . Asthma    as a child  . Binge eating disorder   . Dysrhythmia    " I feel it about once a month"  . Frequent headaches   . Migraines   . PID (acute pelvic inflammatory disease)   . Seasonal allergies   . Tourette disorder   . Urinary tract bacterial infections    Past  Surgical History:  Procedure Laterality Date  . CHOLECYSTECTOMY    . ESOPHAGOGASTRODUODENOSCOPY    . LAPAROSCOPIC CHOLECYSTECTOMY SINGLE SITE WITH INTRAOPERATIVE CHOLANGIOGRAM N/A 03/15/2016   Procedure: LAPAROSCOPIC CHOLECYSTECTOMY SINGLE SITE WITH INTRAOPERATIVE CHOLANGIOGRAM;  Surgeon: Karie Soda, MD;  Location: [facility] OR;  Service: General;  Laterality: N/A;  . TONSILLECTOMY AND ADENOIDECTOMY  2013  . WISDOM TOOTH EXTRACTION     Family History  Problem Relation Age of Onset  . Alcohol abuse Mother   . Bipolar disorder Mother   . Cancer Father        sarcoma; passed away when pt was 8  . Tourette syndrome Maternal Aunt   . Alcohol abuse Maternal Aunt   . Hyperlipidemia Paternal Aunt   . Tourette syndrome Maternal Grandfather   . Heart disease Paternal Grandfather   . Alcohol abuse Maternal Uncle   . Suicidality Neg Hx    Allergies as of 10/24/2016      Reactions   Latex Swelling, Rash   Cranberry Juice Powder Other (See Comments)   Throat pain.   Naproxen Other (See Comments)   "Liver starts hurting" Diarrhea   Nickel    Skin peeling   Sulfa Antibiotics Hives, Swelling      Medication List  Accurate as of 10/24/16  9:47 AM. Always use your most recent med list.          amphetamine-dextroamphetamine 25 MG 24 hr capsule Commonly known as:  ADDERALL XR Take 1 capsule by mouth daily.   amphetamine-dextroamphetamine 10 MG tablet Commonly known as:  ADDERALL Take 1 tablet (10 mg total) by mouth daily.   aspirin-acetaminophen-caffeine 250-250-65 MG tablet Commonly known as:  EXCEDRIN MIGRAINE Take 1-2 tablets by mouth every 6 (six) hours as needed for headache.   clotrimazole-betamethasone cream Commonly known as:  LOTRISONE Apply 1 application topically 2 (two) times daily.   URIBEL 118 MG Caps Take 1 capsule by mouth 2 (two) times daily as needed. For urinary discomfort       All past medical history, surgical history, allergies, family history,  immunizations andmedications were updated in the EMR today and reviewed under the history and medication portions of their EMR.     ROS: Negative, with the exception of above mentioned in HPI   Objective:  BP 113/78 (BP Location: Left Arm, Patient Position: Sitting, Cuff Size: Normal)   Pulse 71   Temp 98.7 F (37.1 C)   Resp 20   Wt 169 lb 8 oz (76.9 kg)   SpO2 100%   BMI 27.78 kg/m  Body mass index is 27.78 kg/m. Gen: Afebrile. No acute distress. Nontoxic in appearance, well developed, well nourished.  HENT: AT. [place]. Bilateral TM visualized without erythema, right ear with air fluid level, no bulging, left TM mildly retracted.  MMM, no oral lesions. Bilateral nares with erythema and drainage, mild swelling. Throat without erythema or exudates. No cough or hoarseness. No teeth pain or gum swelling/redness.  Eyes:Pupils Equal Round Reactive to light, Extraocular movements intact,  Conjunctiva without redness, discharge or icterus. Neck/lymp/endocrine: Supple, mild cervical  Lymphadenopathy with rather significant tender submental lymph node swelling. CV: RRR  Chest: CTAB, no wheeze or crackles. Good air movement, normal resp effort.  Skin: no rashes, purpura or petechiae.  Neuro:  Normal gait. PERLA. EOMi. Alert. Oriented x3   No exam data present No results found. No results found for this or any previous visit (from the past 24 hour(s)).  Assessment/Plan: Cindy Jones is a 26 y.o. female present for OV for  Maxillary sinusitis, unspecified chronicity Lymphadenopathy Rest, hydrate.  +/- flonase, mucinex (DM if cough), nettie pot or nasal saline.  augmentin prescribed, take until completed.  If cough present it can last up to 6-8 weeks.  F/U 2 weeks of not improved.  - amoxicillin-clavulanate (AUGMENTIN) 875-125 MG tablet; Take 1 tablet by mouth 2 (two) times daily.  Dispense: 20 tablet; Refill: 0   Reviewed expectations re: course of current medical issues.  Discussed  self-management of symptoms.  Outlined signs and symptoms indicating need for more acute intervention.  Patient verbalized understanding and all questions were answered.  Patient received an After-Visit Summary.     Note is dictated utilizing voice recognition software. Although note has been proof read prior to signing, occasional typographical errors still can be missed. If any questions arise, please do not hesitate to call for verification.   electronically signed by:  Felix Pacinienee Devanny Palecek, DO  [facility] - [facility]

## 2016-11-14 ENCOUNTER — Encounter (HOSPITAL_COMMUNITY): Payer: Self-pay

## 2016-12-11 ENCOUNTER — Ambulatory Visit (INDEPENDENT_AMBULATORY_CARE_PROVIDER_SITE_OTHER): Payer: BLUE CROSS/BLUE SHIELD | Admitting: Family Medicine

## 2016-12-11 ENCOUNTER — Other Ambulatory Visit (HOSPITAL_COMMUNITY)
Admission: RE | Admit: 2016-12-11 | Discharge: 2016-12-11 | Disposition: A | Discharge status: Home / Self Care | Payer: BLUE CROSS/BLUE SHIELD | Source: Ambulatory Visit | Attending: Family Medicine | Admitting: Family Medicine

## 2016-12-11 ENCOUNTER — Encounter: Payer: Self-pay | Admitting: Family Medicine

## 2016-12-11 VITALS — BP 115/82 | HR 85 | Temp 98.1°F | Resp 20 | Ht 66.0 in | Wt 163.5 lb

## 2016-12-11 DIAGNOSIS — Z01419 Encounter for gynecological examination (general) (routine) without abnormal findings: Secondary | ICD-10-CM | POA: Insufficient documentation

## 2016-12-11 DIAGNOSIS — Z01411 Encounter for gynecological examination (general) (routine) with abnormal findings: Secondary | ICD-10-CM

## 2016-12-11 NOTE — Progress Notes (Signed)
Patient ID: Cindy Jones, female  DOB: 1990-05-13, 26 y.o.   MRN: [id number] Patient Care Team    Relationship Specialty Notifications Start End  Cindy Leatherwood, DO PCP - General Family Medicine  07/14/15   Cindy Soda, MD Consulting Physician General Surgery  01/31/16   Armbruster, Reeves Forth, MD Consulting Physician Gastroenterology  01/31/16     Chief Complaint  Patient presents with  . Gynecologic Exam    Subjective:  Cindy Jones is a 26 y.o.  Female  present for PAP. All past medical history, surgical history, allergies, family history, immunizations, medications and social history were updated  in the electronic medical record today. All recent labs, ED visits and hospitalizations within the last year were reviewed.  Cervical cancer screen: Pt presents today for cervical cancer screen. She reports she did engage in sexual intercourse last night. She does not perform SBE. She reports h/o PID in the past, however G/C negative. Last PAP 2015 (Dr. Marice Jones), normal with reparative/reactive changes only with  absent transformation zone. Pt has no complaints of pain, discharge or lesions. She is in a monogamous relationship.    Depression screen [facility] 2/9 07/14/2016 07/04/2015 05/10/2015 11/06/2014  Decreased Interest 0 0 0 0  Down, Depressed, Hopeless 0 0 0 0  PHQ - 2 Score 0 0 0 0   No flowsheet data found.   Immunization History  Administered Date(s) Administered  . Influenza,inj,Quad PF,36+ Mos 06/08/2014  . Influenza-Unspecified 02/24/2015  . Td 07/14/2015  . Tdap 05/01/2005   Past Medical History:  Diagnosis Date  . Anxiety   . Asthma    as a child  . Binge eating disorder   . Dysrhythmia    " I feel it about once a month"  . Frequent headaches   . Migraines   . PID (acute pelvic inflammatory disease)   . Seasonal allergies   . Tourette disorder   . Urinary tract bacterial infections    Allergies  Allergen Reactions  . Latex Swelling and Rash  . Cranberry  Juice Powder Other (See Comments)    Throat pain.  . Naproxen Other (See Comments)    "Liver starts hurting" Diarrhea  . Nickel     Skin peeling  . Sulfa Antibiotics Hives and Swelling   Past Surgical History:  Procedure Laterality Date  . CHOLECYSTECTOMY    . ESOPHAGOGASTRODUODENOSCOPY    . LAPAROSCOPIC CHOLECYSTECTOMY SINGLE SITE WITH INTRAOPERATIVE CHOLANGIOGRAM N/A 03/15/2016   Procedure: LAPAROSCOPIC CHOLECYSTECTOMY SINGLE SITE WITH INTRAOPERATIVE CHOLANGIOGRAM;  Surgeon: Cindy Soda, MD;  Location: [facility] OR;  Service: General;  Laterality: N/A;  . TONSILLECTOMY AND ADENOIDECTOMY  2013  . WISDOM TOOTH EXTRACTION     Family History  Problem Relation Age of Onset  . Alcohol abuse Mother   . Bipolar disorder Mother   . Cancer Father        sarcoma; passed away when pt was 8  . Tourette syndrome Maternal Aunt   . Alcohol abuse Maternal Aunt   . Hyperlipidemia Paternal Aunt   . Tourette syndrome Maternal Grandfather   . Heart disease Paternal Grandfather   . Alcohol abuse Maternal Uncle   . Suicidality Neg Hx    Social History   Social History  . Marital status: Married    Spouse name: N/A  . Number of children: 0  . Years of education: N/A   Occupational History  . analyst    Social History Main Topics  . Smoking status: Never  Smoker  . Smokeless tobacco: Never Used  . Alcohol use 0.6 oz/week    1 Glasses of wine per week     Comment: occ  . Drug use: No  . Sexual activity: Yes    Partners: Male    Birth control/ protection: None, Condom     Comment: Married   Other Topics Concern  . Not on file   Social History Narrative   - Married, no children.   Psychiatrist- College education.   - She works FT as a Patent attorneyloan broker.   - Her aunt & uncle were her guardians when she was in HS. They [place] in [place], [place].    - She has a fraternal twin sister.    - Wears her seatbelt, smoke detectors in the home.   Allergies as of 12/11/2016      Reactions   Latex Swelling,  Rash   Cranberry Juice Powder Other (See Comments)   Throat pain.   Naproxen Other (See Comments)   "Liver starts hurting" Diarrhea   Nickel    Skin peeling   Sulfa Antibiotics Hives, Swelling      Medication List       Accurate as of 12/11/16  8:51 AM. Always use your most recent med list.          amphetamine-dextroamphetamine 25 MG 24 hr capsule Commonly known as:  ADDERALL XR Take 1 capsule by mouth daily.   amphetamine-dextroamphetamine 10 MG tablet Commonly known as:  ADDERALL Take 1 tablet (10 mg total) by mouth daily.   aspirin-acetaminophen-caffeine 250-250-65 MG tablet Commonly known as:  EXCEDRIN MIGRAINE Take 1-2 tablets by mouth every 6 (six) hours as needed for headache.   Fish Oil 1000 MG Caps Take 1 capsule by mouth daily.   URIBEL 118 MG Caps Take 1 capsule by mouth 2 (two) times daily as needed. For urinary discomfort      All past medical history, surgical history, allergies, family history, immunizations andmedications were updated in the EMR today and reviewed under the history and medication portions of their EMR.     No results found for this or any previous visit (from the past 2160 hour(s)).  ROS: 14 pt review of systems performed and negative (unless mentioned in an HPI)  Objective: BP 115/82 (BP Location: Left Arm, Patient Position: Sitting, Cuff Size: Normal)   Pulse 85   Temp 98.1 F (36.7 C)   Resp 20   Ht 5\' 6"  (1.676 m)   Wt 163 lb 8 oz (74.2 kg)   SpO2 99%   BMI 26.39 kg/m  Gen: Afebrile. No acute distress. Nontoxic in appearance, well-developed, well-nourished,   HENT: AT. [place]. MMM Eyes:Pupils Equal Round Reactive to light, Extraocular movements intact,  Conjunctiva without redness, discharge or icterus. Neck/lymp/endocrine: Supple CV: RRR, no murmur Chest: CTAB, no wheeze, rhonchi or crackles.  Abd: Soft. NTND. BS present Skin:  Warm and well-perfused. Skin intact. Neuro/Msk: Normal gait. PERLA. EOMi. Alert. Oriented x3.    Breasts: breasts appear normal, symmetrical, no tenderness on exam, no suspicious masses, no skin or nipple changes or axillary nodes. GYN:  External genitalia within normal limits, normal hair distribution, no lesions. Urethral meatus normal, no lesions. Vaginal mucosa pink, moist, normal rugae, no lesions. No cystocele or rectocele. cervix with lesion/ulceration at 10 o;clock, no discharge. Bimanual exam revealed normal uterus.  No bladder/suprapubic fullness, masses or tenderness. No cervical motion tenderness. No adnexal fullness. Anus and perineum within normal limits, no lesions.  No  exam data present  Assessment/plan: Cindy Jones is a 26 y.o. female present for PAP. Encounter for gynecological examination with abnormal finding - cervical lesion identified on exam. Will await PAP results (HPV reflex).  - referral to GYN if appropriate.  - Cytology - PAP - F/U dependent on lab results  Return if symptoms worsen or fail to improve.  Electronically signed by: Felix Pacini, DO [facility] [place]

## 2016-12-11 NOTE — Patient Instructions (Signed)
I will call you with your PAP results once they are available. This can take up to a week to get back.

## 2016-12-12 ENCOUNTER — Telehealth: Payer: Self-pay | Admitting: Family Medicine

## 2016-12-12 LAB — CYTOLOGY - PAP
Diagnosis: NEGATIVE
HPV: NOT DETECTED

## 2016-12-12 NOTE — Telephone Encounter (Signed)
Please call pt: - her  PAP was NORMAL.

## 2016-12-12 NOTE — Telephone Encounter (Signed)
Patient notified and verbalized understanding. 

## 2017-01-16 ENCOUNTER — Encounter: Payer: Self-pay | Admitting: Family Medicine

## 2017-01-16 ENCOUNTER — Other Ambulatory Visit (HOSPITAL_COMMUNITY)
Admission: RE | Admit: 2017-01-16 | Discharge: 2017-01-16 | Disposition: A | Discharge status: Home / Self Care | Payer: BLUE CROSS/BLUE SHIELD | Source: Ambulatory Visit | Attending: Family Medicine | Admitting: Family Medicine

## 2017-01-16 ENCOUNTER — Ambulatory Visit (INDEPENDENT_AMBULATORY_CARE_PROVIDER_SITE_OTHER): Payer: BLUE CROSS/BLUE SHIELD | Admitting: Family Medicine

## 2017-01-16 VITALS — BP 119/81 | HR 66 | Temp 98.8°F | Resp 20 | Wt 162.5 lb

## 2017-01-16 DIAGNOSIS — N898 Other specified noninflammatory disorders of vagina: Secondary | ICD-10-CM | POA: Diagnosis not present

## 2017-01-16 DIAGNOSIS — N889 Noninflammatory disorder of cervix uteri, unspecified: Secondary | ICD-10-CM

## 2017-01-16 NOTE — Progress Notes (Signed)
Cindy Jones , 30-Jan-1991, 26 y.o., female MRN: [id number] Patient Care Team    Relationship Specialty Notifications Start End  Natalia Leatherwood, DO PCP - General Family Medicine  07/14/15   Karie Soda, MD Consulting Physician General Surgery  01/31/16   Armbruster, Willaim Rayas, MD Consulting Physician Gastroenterology  01/31/16     Chief Complaint  Patient presents with  . Vaginal Discharge    dark yelllow     Subjective: Pt presents for an OV with complaints of vaginal discharge of a few days duration. She reports it started after she "ate cake".  She endorses a yellowish thick vaginal discharge, mild burning (exterally) with urination, irritation near clitoris. Pt has tried uribel to ease their symptoms. She endorses also needing to wear spanx for a wedding, which normally does not wear. She denies fever, chill, nausea, abd pain.  Depression screen [facility] 2/9 07/14/2016 07/04/2015 05/10/2015 11/06/2014  Decreased Interest 0 0 0 0  Down, Depressed, Hopeless 0 0 0 0  PHQ - 2 Score 0 0 0 0    Allergies  Allergen Reactions  . Latex Swelling and Rash  . Cranberry Juice Powder Other (See Comments)    Throat pain.  . Naproxen Other (See Comments)    "Liver starts hurting" Diarrhea  . Nickel     Skin peeling  . Sulfa Antibiotics Hives and Swelling   Social History  Substance Use Topics  . Smoking status: Never Smoker  . Smokeless tobacco: Never Used  . Alcohol use 0.6 oz/week    1 Glasses of wine per week     Comment: occ   Past Medical History:  Diagnosis Date  . Anxiety   . Asthma    as a child  . Binge eating disorder   . Dysrhythmia    " I feel it about once a month"  . Frequent headaches   . Migraines   . PID (acute pelvic inflammatory disease)   . Seasonal allergies   . Tourette disorder   . Urinary tract bacterial infections    Past Surgical History:  Procedure Laterality Date  . CHOLECYSTECTOMY    . ESOPHAGOGASTRODUODENOSCOPY    . LAPAROSCOPIC CHOLECYSTECTOMY  SINGLE SITE WITH INTRAOPERATIVE CHOLANGIOGRAM N/A 03/15/2016   Procedure: LAPAROSCOPIC CHOLECYSTECTOMY SINGLE SITE WITH INTRAOPERATIVE CHOLANGIOGRAM;  Surgeon: Karie Soda, MD;  Location: [facility] OR;  Service: General;  Laterality: N/A;  . TONSILLECTOMY AND ADENOIDECTOMY  2013  . WISDOM TOOTH EXTRACTION     Family History  Problem Relation Age of Onset  . Alcohol abuse Mother   . Bipolar disorder Mother   . Cancer Father        sarcoma; passed away when pt was 8  . Tourette syndrome Maternal Aunt   . Alcohol abuse Maternal Aunt   . Hyperlipidemia Paternal Aunt   . Tourette syndrome Maternal Grandfather   . Heart disease Paternal Grandfather   . Alcohol abuse Maternal Uncle   . Suicidality Neg Hx    Allergies as of 01/16/2017      Reactions   Latex Swelling, Rash   Cranberry Juice Powder Other (See Comments)   Throat pain.   Naproxen Other (See Comments)   "Liver starts hurting" Diarrhea   Nickel    Skin peeling   Sulfa Antibiotics Hives, Swelling      Medication List       Accurate as of 01/16/17  2:21 PM. Always use your most recent med list.  amphetamine-dextroamphetamine 25 MG 24 hr capsule Commonly known as:  ADDERALL XR Take 1 capsule by mouth daily.   amphetamine-dextroamphetamine 10 MG tablet Commonly known as:  ADDERALL Take 1 tablet (10 mg total) by mouth daily.   aspirin-acetaminophen-caffeine 250-250-65 MG tablet Commonly known as:  EXCEDRIN MIGRAINE Take 1-2 tablets by mouth every 6 (six) hours as needed for headache.   Fish Oil 1000 MG Caps Take 1 capsule by mouth daily.   URIBEL 118 MG Caps Take 1 capsule by mouth 2 (two) times daily as needed. For urinary discomfort       All past medical history, surgical history, allergies, family history, immunizations andmedications were updated in the EMR today and reviewed under the history and medication portions of their EMR.     ROS: Negative, with the exception of above mentioned in  HPI   Objective:  BP 119/81 (BP Location: Right Arm, Patient Position: Sitting, Cuff Size: Normal)   Pulse 66   Temp 98.8 F (37.1 C)   Resp 20   Wt 162 lb 8 oz (73.7 kg)   SpO2 99%   BMI 26.23 kg/m  Body mass index is 26.23 kg/m. Gen: Afebrile. No acute distress. Nontoxic in appearance, well developed, well nourished.  Abd: Soft. NTND. BS present.  GYN:  External genitalia within normal limits, normal hair distribution, no lesions. Urethral meatus normal, no lesions. Vaginal mucosa pink, moist, normal rugae, no lesions. No cystocele or rectocele. cervix with linear lesion at 11 o'clock red ulcerative, mild white thick discharge.  No bladder/suprapubic fullness, masses or tenderness. No cervical motion tenderness. No adnexal fullness. Anus and perineum within normal limits, no lesions.  No exam data present No results found. No results found for this or any previous visit (from the past 24 hour(s)).  Assessment/Plan: Cindy Jones is a 26 y.o. female present for OV for  Vaginal discharge Lesion of cervix - Vaginal Discharge appeared consistent with yeast. Will treat with diflucan x1. Wait on studies for further treatment if required.  - She has a lesion of her cervix that has remained. Visualized during PAP 1 month ago, but PAP was normal with neg HPV. Given it has not healed or resolved, will send to GYN for evaluation.  - Urine Culture Sent. (took uribel) - Cervicovaginal ancillary only. G/C and wet prep sent.  - Referral to GYN - F/U PRN   Reviewed expectations re: course of current medical issues.  Discussed self-management of symptoms.  Outlined signs and symptoms indicating need for more acute intervention.  Patient verbalized understanding and all questions were answered.  Patient received an After-Visit Summary.    No orders of the defined types were placed in this encounter.    Note is dictated utilizing voice recognition software. Although note has been  proof read prior to signing, occasional typographical errors still can be missed. If any questions arise, please do not hesitate to call for verification.   electronically signed by:  Felix Pacini, DO  [facility] - OR

## 2017-01-16 NOTE — Patient Instructions (Signed)
Start diflucan today it is a one time dose.  We will send the urine and pelvic collection to lab and call you once it is resulted.

## 2017-01-17 ENCOUNTER — Telehealth: Payer: Self-pay | Admitting: Family Medicine

## 2017-01-17 LAB — CERVICOVAGINAL ANCILLARY ONLY
Bacterial vaginitis: NEGATIVE
Candida vaginitis: NEGATIVE
Chlamydia: NEGATIVE
Neisseria Gonorrhea: NEGATIVE
Trichomonas: NEGATIVE

## 2017-01-17 MED ORDER — FLUCONAZOLE 150 MG PO TABS: 150.0000 mg | ORAL_TABLET | Freq: Once | ORAL | 0 refills | Status: AC

## 2017-01-17 NOTE — Telephone Encounter (Signed)
Rx was sent to patient pharmacy.

## 2017-01-17 NOTE — Telephone Encounter (Signed)
Patient states she was seen in the office yesterday and advised rx would be sent in to pharmacy.  However, pt states pharmacy has not received rx from pcp.   Pt is unsure of the medication that was to be sent in.  Please send to pharmacy:  [id number] - [place], [place] - [address] AT [facility] [address] [phone] (Phone) [phone] (Fax)

## 2017-01-17 NOTE — Addendum Note (Signed)
Addended by: Thomasena Edis on: 01/17/2017 11:27 AM   Modules accepted: Orders

## 2017-01-18 ENCOUNTER — Encounter (HOSPITAL_COMMUNITY): Payer: Self-pay | Admitting: Psychiatry

## 2017-01-18 ENCOUNTER — Ambulatory Visit (INDEPENDENT_AMBULATORY_CARE_PROVIDER_SITE_OTHER): Payer: BLUE CROSS/BLUE SHIELD | Admitting: Psychiatry

## 2017-01-18 VITALS — BP 124/80 | HR 74 | Ht 66.0 in | Wt 158.8 lb

## 2017-01-18 DIAGNOSIS — F988 Other specified behavioral and emotional disorders with onset usually occurring in childhood and adolescence: Secondary | ICD-10-CM | POA: Diagnosis not present

## 2017-01-18 DIAGNOSIS — F502 Bulimia nervosa: Secondary | ICD-10-CM

## 2017-01-18 DIAGNOSIS — Z818 Family history of other mental and behavioral disorders: Secondary | ICD-10-CM

## 2017-01-18 DIAGNOSIS — Z810 Family history of intellectual disabilities: Secondary | ICD-10-CM

## 2017-01-18 DIAGNOSIS — Z811 Family history of alcohol abuse and dependence: Secondary | ICD-10-CM | POA: Diagnosis not present

## 2017-01-18 DIAGNOSIS — F952 Tourette's disorder: Secondary | ICD-10-CM

## 2017-01-18 LAB — URINE CULTURE
MICRO NUMBER:: 81032435
SPECIMEN QUALITY:: ADEQUATE

## 2017-01-18 MED ORDER — AMPHETAMINE-DEXTROAMPHETAMINE 10 MG PO TABS: 10.0000 mg | ORAL_TABLET | Freq: Every day | ORAL | 0 refills | Status: DC

## 2017-01-18 MED ORDER — AMPHETAMINE-DEXTROAMPHET ER 25 MG PO CP24: 25.0000 mg | ORAL_CAPSULE | Freq: Every day | ORAL | 0 refills | Status: DC

## 2017-01-18 NOTE — Progress Notes (Signed)
BH MD/PA/NP OP Progress Note  01/18/2017 8:36 AM Cindy Jones  MRN:  [id number]  Chief Complaint:  Chief Complaint    Follow-up     HPI: Pt reports she is concerned because she may have a yeast infection and is going to the gyn on Oct 2nd.  Pt is doing her Keto diet and likes it. It helps her keep her mood level. She reports she feels more regulated and is not longer having ups/downs. Pt denies any binging or purging episodes. She states she is not restricting and is in fact eating more now and she feels full after every meal. She is also snacking 2 twice a day.  Pt denies depression. Denies anhedonia, isolation, crying spells, low motivation, poor hygiene, worthlessness and hopelessness. Denies SI/HI.  Sleep is good and she is getting 7-8 hrs/night. Energy is good.  Concentration is good with Adderall. She is taking the  in the AM every day. She is taking  at lunch several times a week. Pt denies SE.  She states her ticks have improved. She notices it when tired or frustrated. She usually doesn't notice it unless she gets a HA or her eyes feel tired.   Taking meds as prescribed and denies SE.  Visit Diagnosis:    ICD-10-CM   1. Tourette disorder F95.2   2. ADD (attention deficit disorder) without hyperactivity F98.8 amphetamine-dextroamphetamine (ADDERALL) 10 MG tablet    amphetamine-dextroamphetamine (ADDERALL XR) 25 MG 24 hr capsule    amphetamine-dextroamphetamine (ADDERALL XR) 25 MG 24 hr capsule    amphetamine-dextroamphetamine (ADDERALL XR) 25 MG 24 hr capsule    amphetamine-dextroamphetamine (ADDERALL) 10 MG tablet  3. Bulimia nervosa F50.2      Past Psychiatric History:  Anxiety:Yes Bipolar Disorder:No Depression:Yes Mania:No Psychosis:No Schizophrenia:No Personality Disorder:No Hospitalization for psychiatric illness:No History of Electroconvulsive Shock Therapy:No Prior Suicide Attempts:No  Past Medical History:  Past Medical History:   Diagnosis Date  . Anxiety   . Asthma    as a child  . Binge eating disorder   . Dysrhythmia    " I feel it about once a month"  . Frequent headaches   . Migraines   . PID (acute pelvic inflammatory disease)   . Seasonal allergies   . Tourette disorder   . Urinary tract bacterial infections     Past Surgical History:  Procedure Laterality Date  . CHOLECYSTECTOMY    . ESOPHAGOGASTRODUODENOSCOPY    . LAPAROSCOPIC CHOLECYSTECTOMY SINGLE SITE WITH INTRAOPERATIVE CHOLANGIOGRAM N/A 03/15/2016   Procedure: LAPAROSCOPIC CHOLECYSTECTOMY SINGLE SITE WITH INTRAOPERATIVE CHOLANGIOGRAM;  Surgeon: Karie Soda, MD;  Location: [facility] OR;  Service: General;  Laterality: N/A;  . TONSILLECTOMY AND ADENOIDECTOMY  2013  . WISDOM TOOTH EXTRACTION      Family Psychiatric History:  Family History  Problem Relation Age of Onset  . Alcohol abuse Mother   . Bipolar disorder Mother   . Cancer Father        sarcoma; passed away when pt was 8  . Tourette syndrome Maternal Aunt   . Alcohol abuse Maternal Aunt   . Hyperlipidemia Paternal Aunt   . Tourette syndrome Maternal Grandfather   . Heart disease Paternal Grandfather   . Alcohol abuse Maternal Uncle   . Suicidality Neg Hx     Social History:  Social History   Social History  . Marital status: Married    Spouse name: N/A  . Number of children: 0  . Years of education: N/A   Occupational History  .  analyst    Social History Main Topics  . Smoking status: Never Smoker  . Smokeless tobacco: Never Used  . Alcohol use 0.6 oz/week    1 Glasses of wine per week     Comment: occ  . Drug use: No  . Sexual activity: Yes    Partners: Male    Birth control/ protection: None, Condom     Comment: Married   Other Topics Concern  . None   Social History Narrative   - Married, no children.   - Naval architect.   - She works FT as a Patent attorney.   - Her aunt & uncle were her guardians when she was in HS. They live in [place], [place].     - She has a fraternal twin sister.    - Wears her seatbelt, smoke detectors in the home.    Allergies:  Allergies  Allergen Reactions  . Latex Swelling and Rash  . Cranberry Juice Powder Other (See Comments)    Throat pain.  . Naproxen Other (See Comments)    "Liver starts hurting" Diarrhea  . Nickel     Skin peeling  . Sulfa Antibiotics Hives and Swelling    Metabolic Disorder Labs: Lab Results  Component Value Date   HGBA1C 4.8 07/14/2016   MPG 91 07/14/2016   No results found for: PROLACTIN No results found for: CHOL, TRIG, HDL, CHOLHDL, VLDL, LDLCALC Lab Results  Component Value Date   TSH 1.44 07/14/2016    Therapeutic Level Labs: No results found for: LITHIUM No results found for: VALPROATE No components found for:  CBMZ  Current Medications: Current Outpatient Prescriptions  Medication Sig Dispense Refill  . amphetamine-dextroamphetamine (ADDERALL XR) 25 MG 24 hr capsule Take 1 capsule by mouth daily. 30 capsule 0  . amphetamine-dextroamphetamine (ADDERALL) 10 MG tablet Take 1 tablet (10 mg total) by mouth daily. 30 tablet 0  . aspirin-acetaminophen-caffeine (EXCEDRIN MIGRAINE) 250-250-65 MG per tablet Take 1-2 tablets by mouth every 6 (six) hours as needed for headache.     . Meth-Hyo-M Bl-Na Phos-Ph Sal (URIBEL) 118 MG CAPS Take 1 capsule by mouth 2 (two) times daily as needed. For urinary discomfort  6  . Omega-3 Fatty Acids (FISH OIL) 1000 MG CAPS Take 1 capsule by mouth daily.     No current facility-administered medications for this visit.      Musculoskeletal: Strength & Muscle Tone: within normal limits Gait & Station: normal Patient leans: N/A  Psychiatric Specialty Exam: Review of Systems  Genitourinary: Positive for urgency. Negative for flank pain, frequency and hematuria.  Psychiatric/Behavioral: Negative for depression, hallucinations, substance abuse and suicidal ideas. The patient is not nervous/anxious and does not have insomnia.      Blood pressure 124/80, pulse 74, height  (1.676 m), weight 158 lb 12.8 oz (72 kg).Body mass index is 25.63 kg/m.  General Appearance: Fairly Groomed  Eye Contact:  Good  Speech:  Clear and Coherent and Normal Rate  Volume:  Normal  Mood:  Euthymic  Affect:  Full Range  Thought Process:  Goal Directed and Descriptions of Associations: Intact  Orientation:  Full (Time, Place, and Person)  Thought Content: Logical   Suicidal Thoughts:  No  Homicidal Thoughts:  No  Memory:  Immediate;   Good Recent;   Good Remote;   Good  Judgement:  Good  Insight:  Good  Psychomotor Activity:  Normal  Concentration:  Concentration: Good and Attention Span: Good  Recall:  Good  Fund  of Knowledge: Good  Language: Good  Akathisia:  No  Handed:  Right  AIMS (if indicated): not done  Assets:  Communication Skills Desire for Improvement Financial Resources/Insurance Housing Intimacy Leisure Time Physical Health Resilience Social Support Talents/Skills Transportation Vocational/Educational  ADL's:  Intact  Cognition: WNL  Sleep:  Good   Screenings: PHQ2-9     Office Visit from 07/14/2016 in [facility] Visit from 07/04/2015 in Primary Care at [facility] Visit from 05/10/2015 in Primary Care at [facility] Visit from 11/06/2014 in Primary Care at [facility] Total Score  0  0  0  0       Assessment and Plan: ADHD-inattentive type; Tourette's disorder; Bulimia Nervosa; MDD-resolved   Medication management with supportive therapy. Risks/benefits and SE of the medication discussed. Pt verbalized understanding and verbal consent obtained for treatment.  Affirm with the patient that the medications are taken as ordered. Patient expressed understanding of how their medications were to be used.   The risk of un-intended pregnancy is medium based on the fact that pt reports she and her husband use condoms. Pt is aware that these meds carry a teratogenic risk. Pt  will discuss plan of action if she does or plans to become pregnant in the future.   Meds: Adderall XR  po qAM for ADHD Adderall  po qLunch for ADHD   Labs: none  Therapy: brief supportive therapy provided. Discussed psychosocial stressors in detail.     Consultations:none  Pt denies SI and is at an acute low risk for suicide. Patient told to call clinic if any problems occur. Patient advised to go to ER if they should develop SI/HI, side effects, or if symptoms worsen. Has crisis numbers to call if needed. Pt verbalized understanding.  F/up in 2 months or sooner if needed   Oletta Darter, MD 01/18/2017, 8:36 AM

## 2017-02-14 ENCOUNTER — Other Ambulatory Visit (HOSPITAL_COMMUNITY)
Admission: RE | Admit: 2017-02-14 | Discharge: 2017-02-14 | Disposition: A | Discharge status: Home / Self Care | Payer: BLUE CROSS/BLUE SHIELD | Source: Ambulatory Visit | Attending: Obstetrics & Gynecology | Admitting: Obstetrics & Gynecology

## 2017-02-14 ENCOUNTER — Encounter: Payer: Self-pay | Admitting: Obstetrics & Gynecology

## 2017-02-14 ENCOUNTER — Ambulatory Visit (INDEPENDENT_AMBULATORY_CARE_PROVIDER_SITE_OTHER): Payer: BLUE CROSS/BLUE SHIELD | Admitting: Obstetrics & Gynecology

## 2017-02-14 VITALS — BP 128/82 | HR 77 | Wt 157.2 lb

## 2017-02-14 DIAGNOSIS — Z0000 Encounter for general adult medical examination without abnormal findings: Secondary | ICD-10-CM

## 2017-02-14 DIAGNOSIS — N889 Noninflammatory disorder of cervix uteri, unspecified: Secondary | ICD-10-CM | POA: Insufficient documentation

## 2017-02-14 DIAGNOSIS — N72 Inflammatory disease of cervix uteri: Secondary | ICD-10-CM | POA: Diagnosis not present

## 2017-02-14 NOTE — Progress Notes (Signed)
   Subjective:    Patient ID: Cindy Jones, female    DOB: 1991-01-20, 26 y.o.   MRN: [id number]  HPI  26 yo MW P0 here for contraception and for a "cervical lesion" seen by her primary care MD at the time of her pap 8/18. The pap came back as negative with negative HPV.   She has used OCPs in the past but felt cranky and does want the OCPs again. She would like to be pregnant by 26 yo. Her husband is in nursing school now.  Review of Systems She got married about 2 years ago. She uses condoms religiously for contraception. She works as an Systems developeranalyst.    Objective:   Physical Exam Well nourished, well hydrated white female, no apparent distress Breathing, conversing, and ambulating normally Her cervix shows almost a defect in the squamous epithelium in the RUQ at the edge of the internal os. I did a biopsy at that site. Silver nitrate yielded hemostasis.     Assessment & Plan:  Cervical lesion- await biopsy. Probably normal. Contraception- She would like an IUD  Rec insertion while on her period

## 2017-03-05 ENCOUNTER — Telehealth: Payer: Self-pay | Admitting: General Practice

## 2017-03-05 NOTE — Telephone Encounter (Signed)
Patient called and left message stating she had an appt on 10/17 for a cervical bx but she cannot see her results in mychart. Patient also states she has a question about billing but isn't sure if she needs to talk to us here about that. Called patient and informed her of negative results & that no follow up of that is needed. Patient verbalized understanding and states she has been talking to her insurance company about the bill and disputing it because she feels since this was seen at her preventative visit this should be covered under that. Discussed with patient that unfortunately if something was found at preventative visit that requires further investigation that usually no longer falls under preventative care. Patient verbalized understanding and states she is just upset she got a large bill and would have liked to know what the cost would be ahead of time. Patient states she is handling it with insurance and the billing department. Patient has no other questions at this time

## 2017-03-14 ENCOUNTER — Ambulatory Visit: Payer: Self-pay | Admitting: Obstetrics & Gynecology

## 2017-04-12 ENCOUNTER — Encounter (HOSPITAL_COMMUNITY): Payer: Self-pay | Admitting: Psychiatry

## 2017-04-12 ENCOUNTER — Ambulatory Visit (INDEPENDENT_AMBULATORY_CARE_PROVIDER_SITE_OTHER): Payer: BLUE CROSS/BLUE SHIELD | Admitting: Psychiatry

## 2017-04-12 VITALS — BP 104/70 | HR 94 | Ht 66.0 in | Wt 163.0 lb

## 2017-04-12 DIAGNOSIS — F502 Bulimia nervosa: Secondary | ICD-10-CM

## 2017-04-12 DIAGNOSIS — F952 Tourette's disorder: Secondary | ICD-10-CM | POA: Diagnosis not present

## 2017-04-12 DIAGNOSIS — F988 Other specified behavioral and emotional disorders with onset usually occurring in childhood and adolescence: Secondary | ICD-10-CM | POA: Diagnosis not present

## 2017-04-12 DIAGNOSIS — Z811 Family history of alcohol abuse and dependence: Secondary | ICD-10-CM

## 2017-04-12 DIAGNOSIS — Z810 Family history of intellectual disabilities: Secondary | ICD-10-CM | POA: Diagnosis not present

## 2017-04-12 DIAGNOSIS — Z818 Family history of other mental and behavioral disorders: Secondary | ICD-10-CM

## 2017-04-12 MED ORDER — AMPHETAMINE-DEXTROAMPHETAMINE 10 MG PO TABS: 10.0000 mg | ORAL_TABLET | Freq: Every day | ORAL | 0 refills | Status: DC

## 2017-04-12 MED ORDER — AMPHETAMINE-DEXTROAMPHET ER 25 MG PO CP24: 25.0000 mg | ORAL_CAPSULE | Freq: Every day | ORAL | 0 refills | Status: DC
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
## 2017-04-19 ENCOUNTER — Ambulatory Visit (INDEPENDENT_AMBULATORY_CARE_PROVIDER_SITE_OTHER): Payer: BLUE CROSS/BLUE SHIELD | Admitting: Licensed Clinical Social Worker

## 2017-04-19 ENCOUNTER — Encounter (HOSPITAL_COMMUNITY): Payer: Self-pay | Admitting: Licensed Clinical Social Worker

## 2017-04-19 DIAGNOSIS — F411 Generalized anxiety disorder: Secondary | ICD-10-CM | POA: Insufficient documentation

## 2017-04-19 DIAGNOSIS — F952 Tourette's disorder: Secondary | ICD-10-CM
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
## 2017-04-20 DIAGNOSIS — D225 Melanocytic nevi of trunk: Secondary | ICD-10-CM | POA: Diagnosis not present

## 2017-04-20 DIAGNOSIS — D2262 Melanocytic nevi of left upper limb, including shoulder: Secondary | ICD-10-CM | POA: Diagnosis not present

## 2017-04-20 DIAGNOSIS — D2272 Melanocytic nevi of left lower limb, including hip: Secondary | ICD-10-CM | POA: Diagnosis not present

## 2017-04-20 DIAGNOSIS — D2261 Melanocytic nevi of right upper limb, including shoulder: Secondary | ICD-10-CM | POA: Diagnosis not present

## 2017-04-20 DIAGNOSIS — L853 Xerosis cutis: Secondary | ICD-10-CM | POA: Diagnosis not present

## 2017-04-20 DIAGNOSIS — D485 Neoplasm of uncertain behavior of skin: Secondary | ICD-10-CM | POA: Diagnosis not present

## 2017-05-07 ENCOUNTER — Encounter (HOSPITAL_COMMUNITY): Payer: Self-pay | Admitting: Licensed Clinical Social Worker

## 2017-05-07 ENCOUNTER — Ambulatory Visit (INDEPENDENT_AMBULATORY_CARE_PROVIDER_SITE_OTHER): Payer: BLUE CROSS/BLUE SHIELD | Admitting: Licensed Clinical Social Worker

## 2017-05-07 DIAGNOSIS — F952 Tourette's disorder: Secondary | ICD-10-CM | POA: Diagnosis not present

## 2017-05-07 DIAGNOSIS — F411 Generalized anxiety disorder: Secondary | ICD-10-CM | POA: Diagnosis not present

## 2017-05-07 NOTE — Progress Notes (Signed)
   THERAPIST PROGRESS NOTE  Session Time: 4:10-5pm  Participation Level: Active  Behavioral Response: Well GroomedAlertAnxious  Type of Therapy: Individual Therapy  Treatment Goals addressed: Coping  Interventions: CBT  Summary: Cindy Jones is a 27 y.o. female who presents for her individual counseling session. Pt discussed her psychiatric symptoms and current life events. Pt continues to be stressed at work. She is a perfectionist and does not want to do poorly at her job so she appears to be taken advantage of. She is now working her old job as well as working her new job. She was tearful in her description. Role played with pt on speaking with her supervisor about expectations. She has a meeting with her this week. Reiterated pt's coping skills she had previously used and suggested: breathing exercises, self soothing and meditation. Pt reports she needed to be nudged to try them again. Pt is still trying to determine the type of relationship she would like with her mother. Talked with pt about boundaries and relationship circles. Encouraged pt to continue to work on her coping skills.  Suicidal/Homicidal: Nowithout intent/plan  Therapist Response: Assessed pt's current functioning and reviewed progress. Assisted pt processing frustrations at job, family relationships and boundaries. Assisted pt processing for the management of her stressors.  Plan: Return again in 1 week.  Diagnosis: Axis I:  Tourette's' disorder, GAD    Tyrees Chopin S, LCAS 05/07/2017

## 2017-05-14 ENCOUNTER — Ambulatory Visit (INDEPENDENT_AMBULATORY_CARE_PROVIDER_SITE_OTHER): Payer: BLUE CROSS/BLUE SHIELD | Admitting: Licensed Clinical Social Worker

## 2017-05-14 ENCOUNTER — Encounter (HOSPITAL_COMMUNITY): Payer: Self-pay | Admitting: Licensed Clinical Social Worker

## 2017-05-14 DIAGNOSIS — F952 Tourette's disorder: Secondary | ICD-10-CM | POA: Diagnosis not present

## 2017-05-14 NOTE — Progress Notes (Signed)
   THERAPIST PROGRESS NOTE  Session Time: 4:10-5pm  Participation Level: Active  Behavioral Response: Well GroomedAlertAnxious  Type of Therapy: Individual Therapy  Treatment Goals addressed: Coping  Interventions: CBT  Summary: Cindy Jones is a 27 y.o. female who presents for her individual counseling session. Pt discussed her psychiatric symptoms and current life events. Pt thinks her meds are working and her symptoms are minimal. Pt discussed her major stressors: work, mother and family. She is experiencing all the stressors at once which makes it difficult for pt to cope> Taught pt how to compartmentalize. Showed her how to take 1 stressor and problem solve. Taught pt mindfulness practice.     Suicidal/Homicidal: Nowithout intent/plan  Therapist Response: Assessed pt's current functioning and reviewed progress. Assisted pt processing stressors of work, mother, family, mindfulness and boundaries. Assisted pt processing for the management of her stressors.  Plan: Return again in 1 week.  Diagnosis: Axis I:  Tourette's' disorder, GAD    Ethelmae Ringel S, LCAS 05/14/2017

## 2017-05-21 ENCOUNTER — Encounter (HOSPITAL_COMMUNITY): Payer: Self-pay | Admitting: Licensed Clinical Social Worker

## 2017-05-21 ENCOUNTER — Ambulatory Visit (INDEPENDENT_AMBULATORY_CARE_PROVIDER_SITE_OTHER): Payer: BLUE CROSS/BLUE SHIELD | Admitting: Licensed Clinical Social Worker

## 2017-05-21 DIAGNOSIS — F411 Generalized anxiety disorder: Secondary | ICD-10-CM

## 2017-05-21 DIAGNOSIS — F952 Tourette's disorder: Secondary | ICD-10-CM

## 2017-05-21 NOTE — Progress Notes (Signed)
   THERAPIST PROGRESS NOTE  Session Time: 4:10-5pm  Participation Level: Active  Behavioral Response: Well GroomedAlertAnxious  Type of Therapy: Individual Therapy  Treatment Goals addressed: Coping  Interventions: CBT  Summary: Cindy Jones is a 27 y.o. female who presents for her individual counseling session. Pt discussed her psychiatric symptoms and current life events. Pt thinks her meds are working well. Pt reports the stress at work is manageable now that she has asked for help and using her boundaries. Pt reports she and her husband are beginning to have communication issues again. Asked open ended questions. Taught pt positive communication skills and role played with pt. Another stressor: relationship with pt's mother. Taught pt basic cbt skills.Will continue processing relationships with pt.   Suicidal/Homicidal: Nowithout intent/plan  Therapist Response: Assessed pt's current functioning and reviewed progress. Assisted pt processing communication skills, role playing positive communication, relationships. Assisted pt processing for the management of her stressors.  Plan: Return again in 1 week. Continue working on relationship issues with mother and sister.  Diagnosis: Axis I:  Tourette's' disorder, GAD    Daryl Beehler S, LCAS 05/21/2017

## 2017-06-07 ENCOUNTER — Ambulatory Visit (INDEPENDENT_AMBULATORY_CARE_PROVIDER_SITE_OTHER): Payer: BLUE CROSS/BLUE SHIELD | Admitting: Licensed Clinical Social Worker

## 2017-06-07 ENCOUNTER — Encounter (HOSPITAL_COMMUNITY): Payer: Self-pay | Admitting: Licensed Clinical Social Worker

## 2017-06-07 ENCOUNTER — Telehealth (HOSPITAL_COMMUNITY): Payer: Self-pay

## 2017-06-07 DIAGNOSIS — F952 Tourette's disorder: Secondary | ICD-10-CM

## 2017-06-07 NOTE — Telephone Encounter (Signed)
Medication management - Met with patient, in to see Cindy Jones, therapist today as pt was concerned for her recently developed increased low libido but admitted she has never had this problem with Adderall medication. Patient reported no other life changes except that she has been on a Keto's diet since 8/18 and this has occurred since then.  Met with Dr. Doyne Keel to discuss as she reported unlikely patient has started having low libido from Adderall after she has been on it for a long time.  Requested patient be evaluated by her OBGY&N prior to appointment scheduled 07/12/17.  Discussed this with patient who reported she recently had an OBGY&N check up and agreed to call her primary care provider to question if Keto's diet may be attributing to this or if needs to come in for evaluation.  Patient agreed to call our office back if needs to be worked in sooner to discuss further with Dr. Doyne Keel.

## 2017-06-07 NOTE — Progress Notes (Signed)
   THERAPIST PROGRESS NOTE  Session Time: 4:10-5pm  Participation Level: Active  Behavioral Response: Well GroomedAlertAnxious  Type of Therapy: Individual Therapy  Treatment Goals addressed: Coping  Interventions: CBT  Summary: Maryjean Kamily H Zervas is a 27 y.o. female who presents for her individual counseling session. Pt discussed her psychiatric symptoms and current life events. Pt thinks her meds are working well. Pt comes in today tearful about marital issues. She feels no intimacy in their marriage and now she has no interest in sexual relations. Asked RN Ladona Ridgelaylor to join session to discuss medical reasons. It was suggested she contact her PCP to discuss symptoms. RN Ladona Ridgelaylor spoke with pt's psychiatrist Michae Kavagarwal who did not think it was a medication side effect.  Discussed intimacy and communication within a marital unit.      Suicidal/Homicidal: Nowithout intent/plan  Therapist Response: Assessed pt's current functioning and reviewed progress. Assisted pt processing communication skills, intimacy, lack of sexual desire, marital issues., Assisted pt processing for the management of her stressors.  Plan: Return again in 1 week. Continue working on relationship issues with mother and sister.  Diagnosis: Axis I:  Tourette's' disorder, GAD    Carole Deere S, LCAS 06/07/2017

## 2017-06-25 ENCOUNTER — Encounter (HOSPITAL_COMMUNITY): Payer: Self-pay | Admitting: Licensed Clinical Social Worker

## 2017-06-25 ENCOUNTER — Ambulatory Visit (INDEPENDENT_AMBULATORY_CARE_PROVIDER_SITE_OTHER): Payer: BLUE CROSS/BLUE SHIELD | Admitting: Licensed Clinical Social Worker

## 2017-06-25 DIAGNOSIS — F952 Tourette's disorder: Secondary | ICD-10-CM | POA: Diagnosis not present

## 2017-06-25 NOTE — Progress Notes (Signed)
   THERAPIST PROGRESS NOTE  Session Time: 4:10-5pm  Participation Level: Active  Behavioral Response: Well GroomedAlertAnxious  Type of Therapy: Individual Therapy  Treatment Goals addressed: Coping  Interventions: CBT  Summary: Cindy Jones is a 27 y.o. female who presents for her individual counseling session. Pt discussed her psychiatric symptoms and current life events. Pt reports she is having some eating disorder symptoms. Gave pt referral to 2 eating disorder therapists. Pt reports after last session she and her husband had an adult conversation and communicated their feelings well. They were also able to problem solve some issues both were experiencing. She reports their intimacy and communication has improved. Discussed family boundaries with pt and role played them. Asked open ended questions about family and boundaries.  t.      Suicidal/Homicidal: Nowithout intent/plan  Therapist Response: Assessed pt's current functioning and reviewed progress. Assisted pt processing communication skills, intimacy, boundaries., Assisted pt processing for the management of her stressors.  Plan: Return again in 1 week. Continue working on relationship issues with mother and sister.  Diagnosis: Axis I:  Tourette's' disorder, GAD    Cindy Jones S, LCAS 06/25/2017

## 2017-06-28 DIAGNOSIS — M9902 Segmental and somatic dysfunction of thoracic region: Secondary | ICD-10-CM | POA: Diagnosis not present

## 2017-06-28 DIAGNOSIS — M9905 Segmental and somatic dysfunction of pelvic region: Secondary | ICD-10-CM | POA: Diagnosis not present

## 2017-06-28 DIAGNOSIS — M62830 Muscle spasm of back: Secondary | ICD-10-CM | POA: Diagnosis not present

## 2017-06-28 DIAGNOSIS — M9903 Segmental and somatic dysfunction of lumbar region: Secondary | ICD-10-CM | POA: Diagnosis not present

## 2017-07-02 DIAGNOSIS — M62830 Muscle spasm of back: Secondary | ICD-10-CM | POA: Diagnosis not present

## 2017-07-02 DIAGNOSIS — M9903 Segmental and somatic dysfunction of lumbar region: Secondary | ICD-10-CM | POA: Diagnosis not present

## 2017-07-02 DIAGNOSIS — M9905 Segmental and somatic dysfunction of pelvic region: Secondary | ICD-10-CM | POA: Diagnosis not present

## 2017-07-02 DIAGNOSIS — M9902 Segmental and somatic dysfunction of thoracic region: Secondary | ICD-10-CM | POA: Diagnosis not present

## 2017-07-05 ENCOUNTER — Ambulatory Visit (HOSPITAL_COMMUNITY): Payer: Self-pay | Admitting: Licensed Clinical Social Worker

## 2017-07-06 DIAGNOSIS — M62830 Muscle spasm of back: Secondary | ICD-10-CM | POA: Diagnosis not present

## 2017-07-06 DIAGNOSIS — M9905 Segmental and somatic dysfunction of pelvic region: Secondary | ICD-10-CM | POA: Diagnosis not present

## 2017-07-06 DIAGNOSIS — M9903 Segmental and somatic dysfunction of lumbar region: Secondary | ICD-10-CM | POA: Diagnosis not present

## 2017-07-06 DIAGNOSIS — M9902 Segmental and somatic dysfunction of thoracic region: Secondary | ICD-10-CM | POA: Diagnosis not present

## 2017-07-09 DIAGNOSIS — M62830 Muscle spasm of back: Secondary | ICD-10-CM | POA: Diagnosis not present

## 2017-07-09 DIAGNOSIS — M9905 Segmental and somatic dysfunction of pelvic region: Secondary | ICD-10-CM | POA: Diagnosis not present

## 2017-07-09 DIAGNOSIS — M9902 Segmental and somatic dysfunction of thoracic region: Secondary | ICD-10-CM | POA: Diagnosis not present

## 2017-07-09 DIAGNOSIS — M9903 Segmental and somatic dysfunction of lumbar region: Secondary | ICD-10-CM | POA: Diagnosis not present

## 2017-07-12 ENCOUNTER — Ambulatory Visit (INDEPENDENT_AMBULATORY_CARE_PROVIDER_SITE_OTHER): Payer: BLUE CROSS/BLUE SHIELD | Admitting: Psychiatry

## 2017-07-12 ENCOUNTER — Encounter (HOSPITAL_COMMUNITY): Payer: Self-pay | Admitting: Psychiatry

## 2017-07-12 ENCOUNTER — Ambulatory Visit (HOSPITAL_COMMUNITY): Payer: Self-pay | Admitting: Licensed Clinical Social Worker

## 2017-07-12 DIAGNOSIS — F502 Bulimia nervosa: Secondary | ICD-10-CM

## 2017-07-12 DIAGNOSIS — F900 Attention-deficit hyperactivity disorder, predominantly inattentive type: Secondary | ICD-10-CM

## 2017-07-12 DIAGNOSIS — Z811 Family history of alcohol abuse and dependence: Secondary | ICD-10-CM

## 2017-07-12 DIAGNOSIS — F952 Tourette's disorder: Secondary | ICD-10-CM | POA: Diagnosis not present

## 2017-07-12 DIAGNOSIS — F988 Other specified behavioral and emotional disorders with onset usually occurring in childhood and adolescence: Secondary | ICD-10-CM | POA: Diagnosis not present

## 2017-07-12 DIAGNOSIS — Z818 Family history of other mental and behavioral disorders: Secondary | ICD-10-CM | POA: Diagnosis not present

## 2017-07-12 MED ORDER — AMPHETAMINE-DEXTROAMPHET ER 25 MG PO CP24: 25.0000 mg | ORAL_CAPSULE | Freq: Every day | ORAL | 0 refills | Status: DC

## 2017-07-12 MED ORDER — AMPHETAMINE-DEXTROAMPHETAMINE 10 MG PO TABS: 10.0000 mg | ORAL_TABLET | Freq: Every day | ORAL | 0 refills | Status: DC
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
## 2017-07-15 ENCOUNTER — Other Ambulatory Visit: Payer: Self-pay | Admitting: Urgent Care

## 2017-07-15 MED ORDER — TRIAMCINOLONE ACETONIDE 0.1 % EX CREA: 1.0000 "application " | TOPICAL_CREAM | Freq: Two times a day (BID) | CUTANEOUS | 0 refills | Status: DC

## 2017-07-15 NOTE — Progress Notes (Signed)
Discussed symptoms by phone. Patient has a localized skin reaction to her dog. Will send script for Kenalog to apply twice daily for 1 week. Use Zyrtec and Zantac in addition.

## 2017-07-16 DIAGNOSIS — M9902 Segmental and somatic dysfunction of thoracic region: Secondary | ICD-10-CM | POA: Diagnosis not present

## 2017-07-16 DIAGNOSIS — M62830 Muscle spasm of back: Secondary | ICD-10-CM | POA: Diagnosis not present

## 2017-07-16 DIAGNOSIS — M9903 Segmental and somatic dysfunction of lumbar region: Secondary | ICD-10-CM | POA: Diagnosis not present

## 2017-07-16 DIAGNOSIS — M9905 Segmental and somatic dysfunction of pelvic region: Secondary | ICD-10-CM | POA: Diagnosis not present

## 2017-07-23 DIAGNOSIS — M9902 Segmental and somatic dysfunction of thoracic region: Secondary | ICD-10-CM | POA: Diagnosis not present

## 2017-07-23 DIAGNOSIS — M9905 Segmental and somatic dysfunction of pelvic region: Secondary | ICD-10-CM | POA: Diagnosis not present

## 2017-07-23 DIAGNOSIS — M62830 Muscle spasm of back: Secondary | ICD-10-CM | POA: Diagnosis not present

## 2017-07-23 DIAGNOSIS — M9903 Segmental and somatic dysfunction of lumbar region: Secondary | ICD-10-CM | POA: Diagnosis not present

## 2017-07-24 ENCOUNTER — Ambulatory Visit (INDEPENDENT_AMBULATORY_CARE_PROVIDER_SITE_OTHER): Payer: BLUE CROSS/BLUE SHIELD | Admitting: Licensed Clinical Social Worker

## 2017-07-24 DIAGNOSIS — F411 Generalized anxiety disorder: Secondary | ICD-10-CM

## 2017-07-25 ENCOUNTER — Encounter (HOSPITAL_COMMUNITY): Payer: Self-pay | Admitting: Licensed Clinical Social Worker
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
## 2017-07-30 DIAGNOSIS — M9902 Segmental and somatic dysfunction of thoracic region: Secondary | ICD-10-CM | POA: Diagnosis not present

## 2017-07-30 DIAGNOSIS — M9903 Segmental and somatic dysfunction of lumbar region: Secondary | ICD-10-CM | POA: Diagnosis not present

## 2017-07-30 DIAGNOSIS — M9905 Segmental and somatic dysfunction of pelvic region: Secondary | ICD-10-CM | POA: Diagnosis not present

## 2017-07-30 DIAGNOSIS — M62830 Muscle spasm of back: Secondary | ICD-10-CM | POA: Diagnosis not present

## 2017-08-06 DIAGNOSIS — M9902 Segmental and somatic dysfunction of thoracic region: Secondary | ICD-10-CM | POA: Diagnosis not present

## 2017-08-06 DIAGNOSIS — M9905 Segmental and somatic dysfunction of pelvic region: Secondary | ICD-10-CM | POA: Diagnosis not present

## 2017-08-06 DIAGNOSIS — M9903 Segmental and somatic dysfunction of lumbar region: Secondary | ICD-10-CM | POA: Diagnosis not present

## 2017-08-06 DIAGNOSIS — M62830 Muscle spasm of back: Secondary | ICD-10-CM | POA: Diagnosis not present

## 2017-08-08 ENCOUNTER — Ambulatory Visit (INDEPENDENT_AMBULATORY_CARE_PROVIDER_SITE_OTHER): Payer: BLUE CROSS/BLUE SHIELD | Admitting: Licensed Clinical Social Worker

## 2017-08-08 ENCOUNTER — Encounter (HOSPITAL_COMMUNITY): Payer: Self-pay | Admitting: Licensed Clinical Social Worker

## 2017-08-08 DIAGNOSIS — F988 Other specified behavioral and emotional disorders with onset usually occurring in childhood and adolescence: Secondary | ICD-10-CM

## 2017-08-08 DIAGNOSIS — F411 Generalized anxiety disorder: Secondary | ICD-10-CM

## 2017-08-08 NOTE — Progress Notes (Signed)
   THERAPIST PROGRESS NOTE  Session Time: 4:10-5pm  Participation Level: Active  Behavioral Response: Well GroomedAlertAnxious  Type of Therapy: Individual Therapy  Treatment Goals addressed: Coping  Interventions: CBT  Summary: Cindy Jones is a 27 y.o. female who presents for her individual counseling session. Pt discussed her psychiatric symptoms and current life events.Pt feels her medications are working well with few symptoms. Her anxiety has increased some due to her husband's graduation from nursing school in 3 weeks. She helps him study with flash cards, which can be stressful for her as she has no time for herself. Pt struggles still with emotional boundaries.  Role played boundaries with pt to use with her husband, sister and mother. Taught pt breathing exercises to use during this stressful time.         Suicidal/Homicidal: Nowithout intent/plan  Therapist Response: Assessed pt's current functioning and reviewed progress. Assisted pt processing boundaries, anxious symptoms, breathing exercises. Assisted pt processing for the management of her stressors.  Plan: Return again in 1 week. Continue working on relationship issues with mother and sister.  Diagnosis: Axis I:  GAD    Hinton Luellen S, LCAS 08/08/2017

## 2017-08-20 DIAGNOSIS — M9905 Segmental and somatic dysfunction of pelvic region: Secondary | ICD-10-CM | POA: Diagnosis not present

## 2017-08-20 DIAGNOSIS — M9902 Segmental and somatic dysfunction of thoracic region: Secondary | ICD-10-CM | POA: Diagnosis not present

## 2017-08-20 DIAGNOSIS — M9903 Segmental and somatic dysfunction of lumbar region: Secondary | ICD-10-CM | POA: Diagnosis not present

## 2017-08-20 DIAGNOSIS — M62830 Muscle spasm of back: Secondary | ICD-10-CM | POA: Diagnosis not present

## 2017-08-22 ENCOUNTER — Ambulatory Visit (INDEPENDENT_AMBULATORY_CARE_PROVIDER_SITE_OTHER): Payer: BLUE CROSS/BLUE SHIELD | Admitting: Licensed Clinical Social Worker

## 2017-08-22 ENCOUNTER — Encounter (HOSPITAL_COMMUNITY): Payer: Self-pay | Admitting: Licensed Clinical Social Worker

## 2017-08-22 DIAGNOSIS — F411 Generalized anxiety disorder: Secondary | ICD-10-CM | POA: Diagnosis not present

## 2017-08-22 NOTE — Progress Notes (Signed)
   THERAPIST PROGRESS NOTE  Session Time: 4:10-5pm  Participation Level: Active  Behavioral Response: Well GroomedAlertAnxious  Type of Therapy: Individual Therapy  Treatment Goals addressed: Coping  Interventions: CBT  Summary: Cindy Jones is a 27 y.o. female who presents for her individual counseling session. Pt discussed her psychiatric symptoms and current life events.Pt feels her medications are working well with few symptoms. Pt presents with anxiety due to her husband's graduation and finishing the RN program. Processed control with pt and how to best support her husband. Pt was receptive to suggestions. Pt wanted to talk about their next phase of life. Asked open ended questions and used empathic reflection. Pt had lunch with her aunt and uncle, who she refers to as mom and dad. This is pt's family who supports her unconditionally. Pt feels accepted and loved  by them, something she felt little of in her own family.Discussed feelings with pt where she was able to identify and feel her feelings.    .         Suicidal/Homicidal: Nowithout intent/plan  Therapist Response: Assessed pt's current functioning and reviewed progress. Assisted pt processing family, feelings, family. Assisted pt processing for the management of her stressors.  Plan: Return again in 1 week. Continue working on relationship issues with mother and sister.  Diagnosis: Axis I:  GAD    Lashan Gluth S, LCAS 08/22/2017

## 2017-08-27 DIAGNOSIS — M62830 Muscle spasm of back: Secondary | ICD-10-CM | POA: Diagnosis not present

## 2017-08-27 DIAGNOSIS — M9902 Segmental and somatic dysfunction of thoracic region: Secondary | ICD-10-CM | POA: Diagnosis not present

## 2017-08-27 DIAGNOSIS — M9905 Segmental and somatic dysfunction of pelvic region: Secondary | ICD-10-CM | POA: Diagnosis not present

## 2017-08-27 DIAGNOSIS — M9903 Segmental and somatic dysfunction of lumbar region: Secondary | ICD-10-CM | POA: Diagnosis not present

## 2017-09-01 DIAGNOSIS — H6691 Otitis media, unspecified, right ear: Secondary | ICD-10-CM | POA: Diagnosis not present

## 2017-09-01 DIAGNOSIS — J329 Chronic sinusitis, unspecified: Secondary | ICD-10-CM | POA: Diagnosis not present

## 2017-09-04 ENCOUNTER — Encounter (HOSPITAL_COMMUNITY): Payer: Self-pay | Admitting: Licensed Clinical Social Worker

## 2017-09-04 ENCOUNTER — Ambulatory Visit (INDEPENDENT_AMBULATORY_CARE_PROVIDER_SITE_OTHER): Payer: BLUE CROSS/BLUE SHIELD | Admitting: Licensed Clinical Social Worker

## 2017-09-04 DIAGNOSIS — Z566 Other physical and mental strain related to work: Secondary | ICD-10-CM | POA: Diagnosis not present

## 2017-09-04 DIAGNOSIS — F411 Generalized anxiety disorder: Secondary | ICD-10-CM

## 2017-09-04 DIAGNOSIS — F509 Eating disorder, unspecified: Secondary | ICD-10-CM | POA: Diagnosis not present

## 2017-09-04 NOTE — Progress Notes (Signed)
   THERAPIST PROGRESS NOTE  Session Time: 4:10-5pm  Participation Level: Active  Behavioral Response: Well GroomedAlertAnxious  Type of Therapy: Individual Therapy  Treatment Goals addressed: Coping  Interventions: CBT  Summary: Cindy Jones is a 27 y.o. female who presents for her individual counseling session. Pt discussed her psychiatric symptoms and current life events.Pt feels her medications are working well with few symptoms. Pt's husband graduated so there is less stress in the home. Pt described how she has been able to relax and enjoy her husband. She has now decided to go back to graduate school. Asked open ended questions. Pt shared she and her husband are going to go on 2 short vacations to enjoy stress free life momentarily. Pt spoke about the Keto diet and how it has affected her eating disorder. Gave pt information on eating disorder therapist. Pt still wants to maintain therapy here at clinic as well. Reminded her she will need to seek therapy with eating disorder therapist. She is interested in bringing her husband to the next session here as an informational session. Pt continues to find work a stressor. Taught pt mindfulness activity that she can use in her cubicle at work.         .         Suicidal/Homicidal: Nowithout intent/plan  Therapist Response: Assessed pt's current functioning and reviewed progress. Assisted pt processing stress, grad school, keto diet, work stressor, mindfulness activity.  Assisted pt processing for the management of her stressors.  Plan: Return again in 2 weeks. Bring husband to next session.  Diagnosis: Axis I:  GAD    Jahmarion Popoff S, LCAS 09/04/2017

## 2017-09-12 DIAGNOSIS — M9902 Segmental and somatic dysfunction of thoracic region: Secondary | ICD-10-CM | POA: Diagnosis not present

## 2017-09-12 DIAGNOSIS — M62830 Muscle spasm of back: Secondary | ICD-10-CM | POA: Diagnosis not present

## 2017-09-12 DIAGNOSIS — M9905 Segmental and somatic dysfunction of pelvic region: Secondary | ICD-10-CM | POA: Diagnosis not present

## 2017-09-12 DIAGNOSIS — M9903 Segmental and somatic dysfunction of lumbar region: Secondary | ICD-10-CM | POA: Diagnosis not present

## 2017-09-14 ENCOUNTER — Encounter: Payer: Self-pay | Admitting: Family Medicine

## 2017-09-18 ENCOUNTER — Ambulatory Visit (HOSPITAL_COMMUNITY): Payer: Self-pay | Admitting: Licensed Clinical Social Worker

## 2017-09-19 ENCOUNTER — Encounter: Payer: Self-pay | Admitting: Family Medicine

## 2017-10-02 ENCOUNTER — Ambulatory Visit (HOSPITAL_COMMUNITY): Payer: Self-pay | Admitting: Licensed Clinical Social Worker

## 2017-10-11 ENCOUNTER — Encounter (HOSPITAL_COMMUNITY): Payer: Self-pay | Admitting: Psychiatry

## 2017-10-11 ENCOUNTER — Ambulatory Visit (HOSPITAL_COMMUNITY): Payer: BLUE CROSS/BLUE SHIELD | Admitting: Psychiatry

## 2017-10-11 VITALS — BP 128/74 | HR 88 | Ht 66.0 in | Wt 169.0 lb

## 2017-10-11 DIAGNOSIS — Z811 Family history of alcohol abuse and dependence: Secondary | ICD-10-CM

## 2017-10-11 DIAGNOSIS — F502 Bulimia nervosa: Secondary | ICD-10-CM | POA: Diagnosis not present

## 2017-10-11 DIAGNOSIS — F952 Tourette's disorder: Secondary | ICD-10-CM

## 2017-10-11 DIAGNOSIS — Z818 Family history of other mental and behavioral disorders: Secondary | ICD-10-CM | POA: Diagnosis not present

## 2017-10-11 DIAGNOSIS — F3340 Major depressive disorder, recurrent, in remission, unspecified: Secondary | ICD-10-CM | POA: Diagnosis not present

## 2017-10-11 DIAGNOSIS — F988 Other specified behavioral and emotional disorders with onset usually occurring in childhood and adolescence: Secondary | ICD-10-CM

## 2017-10-11 DIAGNOSIS — Z79899 Other long term (current) drug therapy: Secondary | ICD-10-CM

## 2017-10-11 MED ORDER — AMPHETAMINE-DEXTROAMPHETAMINE 10 MG PO TABS: 10.0000 mg | ORAL_TABLET | Freq: Every day | ORAL | 0 refills | Status: DC

## 2017-10-11 MED ORDER — AMPHETAMINE-DEXTROAMPHET ER 25 MG PO CP24: 25.0000 mg | ORAL_CAPSULE | Freq: Every day | ORAL | 0 refills | Status: DC

## 2017-10-11 NOTE — Progress Notes (Signed)
BH MD/PA/NP OP Progress Note  10/11/2017 8:47 AM Cindy Jones  MRN:  [id number]  Chief Complaint:  Chief Complaint    Follow-up     HPI: Patient reports that she is doing well.  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  She starts in the fall.  She is very excited but nervous at the same time.  She states the last time she was in school with it were 5 years ago.  She will continue to work full-time and take classes part-time.  She continues to deny any depression symptoms.  Sleep is good.  She denies SI/HI.  She denies any binging or purging episodes.  She states that now she is doing a modified keto diet and is working out with a Systems analyst.  Her goal is to keep her weight stable currently at 169 pounds.  Patient is taking her Adderall in the morning and states that it is effective.  She is productive at work and is happy with his performance.  When needed she will take an extra 10 mg in the late afternoon.  Patient is taking her medications as prescribed and denies side effects.  Visit Diagnosis:    ICD-10-CM   1. ADD (attention deficit disorder) without hyperactivity F98.8 amphetamine-dextroamphetamine (ADDERALL XR) 25 MG 24 hr capsule    amphetamine-dextroamphetamine (ADDERALL XR) 25 MG 24 hr capsule    amphetamine-dextroamphetamine (ADDERALL XR) 25 MG 24 hr capsule    amphetamine-dextroamphetamine (ADDERALL) 10 MG tablet    amphetamine-dextroamphetamine (ADDERALL) 10 MG tablet    amphetamine-dextroamphetamine (ADDERALL) 10 MG tablet  2. Bulimia F50.2   3. Recurrent major depressive disorder, in remission (HCC) F33.40   4. Tourette syndrome F95.2       Past Psychiatric History: Anxiety: Yes Bipolar Disorder: No Depression: Yes Mania: No Psychosis: No Schizophrenia: No Personality Disorder: No Hospitalization for psychiatric illness: No History of Electroconvulsive Shock Therapy: No Prior Suicide  Attempts: No   Past Medical History:  Past Medical History:  Diagnosis Date  . Anxiety   . Asthma    as a child  . Binge eating disorder   . Dysrhythmia    " I feel it about once a month"  . Frequent headaches   . Migraines   . PID (acute pelvic inflammatory disease)   . Seasonal allergies   . Tourette disorder   . Urinary tract bacterial infections     Past Surgical History:  Procedure Laterality Date  . CHOLECYSTECTOMY    . ESOPHAGOGASTRODUODENOSCOPY    . LAPAROSCOPIC CHOLECYSTECTOMY SINGLE SITE WITH INTRAOPERATIVE CHOLANGIOGRAM N/A 03/15/2016   Procedure: LAPAROSCOPIC CHOLECYSTECTOMY SINGLE SITE WITH INTRAOPERATIVE CHOLANGIOGRAM;  Surgeon: Karie Soda, MD;  [facility];  Service: General;  Laterality: N/A;  . TONSILLECTOMY AND ADENOIDECTOMY  2013  . WISDOM TOOTH EXTRACTION      Family Psychiatric History: Family History  Problem Relation Age of Onset  . Alcohol abuse Mother   . Bipolar disorder Mother   . Cancer Father        sarcoma; passed away when pt was 8  . Tourette syndrome Maternal Aunt   . Alcohol abuse Maternal Aunt   . Hyperlipidemia Paternal Aunt   . Tourette syndrome Maternal Grandfather   . Heart disease Paternal Grandfather   . Alcohol abuse Maternal Uncle   . Suicidality Neg Hx     Social History:  Social History   Socioeconomic History  .  Marital status: Married    Spouse name: Not on file  . Number of children: 0  . Years of education: Not on file  . Highest education level: Not on file  Occupational History  . Occupation: Systems developer  Social Needs  . Financial resource strain: Not on file  . Food insecurity:    Worry: Not on file    Inability: Not on file  . Transportation needs:    Medical: Not on file    Non-medical: Not on file  Tobacco Use  . Smoking status: Never Smoker  . Smokeless tobacco: Never Used  Substance and Sexual Activity  . Alcohol use: Yes    Alcohol/week: 0.6 oz    Types: 1 Glasses of wine per week     Comment: occ  . Drug use: No  . Sexual activity: Yes    Partners: Male    Birth control/protection: None, Condom    Comment: Married  Lifestyle  . Physical activity:    Days per week: Not on file    Minutes per session: Not on file  . Stress: Not on file  Relationships  . Social connections:    Talks on phone: Not on file    Gets together: Not on file    Attends religious service: Not on file    Active member of club or organization: Not on file    Attends meetings of clubs or organizations: Not on file    Relationship status: Not on file  Other Topics Concern  . Not on file  Social History Narrative   - Married, no children.   Psychiatrist education.   - She works FT as a Patent attorney.   - Her aunt & uncle were her guardians when she was in HS. They live in [place], [place].    - She has a fraternal twin sister.    - Wears her seatbelt, smoke detectors in the home.    Allergies:  Allergies  Allergen Reactions  . Latex Swelling and Rash  . Cranberry Juice Powder Other (See Comments)    Throat pain.  . Naproxen Other (See Comments)    "Liver starts hurting" Diarrhea  . Nickel     Skin peeling  . Sulfa Antibiotics Hives and Swelling    Metabolic Disorder Labs: Lab Results  Component Value Date   HGBA1C 4.8 07/14/2016   MPG 91 07/14/2016   No results found for: PROLACTIN No results found for: CHOL, TRIG, HDL, CHOLHDL, VLDL, LDLCALC Lab Results  Component Value Date   TSH 1.44 07/14/2016    Therapeutic Level Labs: No results found for: LITHIUM No results found for: VALPROATE No components found for:  CBMZ  Current Medications: Current Outpatient Medications  Medication Sig Dispense Refill  . amphetamine-dextroamphetamine (ADDERALL XR) 25 MG 24 hr capsule Take 1 capsule by mouth daily. 30 capsule 0  . amphetamine-dextroamphetamine (ADDERALL XR) 25 MG 24 hr capsule Take 1 capsule by mouth daily. 30 capsule 0  . amphetamine-dextroamphetamine (ADDERALL XR) 25  MG 24 hr capsule Take 1 capsule by mouth daily. 30 capsule 0  . amphetamine-dextroamphetamine (ADDERALL) 10 MG tablet Take 1 tablet (10 mg total) by mouth daily with lunch. 30 tablet 0  . amphetamine-dextroamphetamine (ADDERALL) 10 MG tablet Take 1 tablet (10 mg total) by mouth daily. 30 tablet 0  . amphetamine-dextroamphetamine (ADDERALL) 10 MG tablet Take 1 tablet (10 mg total) by mouth daily. 30 tablet 0  . aspirin-acetaminophen-caffeine (EXCEDRIN MIGRAINE) 250-250-65 MG per tablet Take 1-2  tablets by mouth every 6 (six) hours as needed for headache.     . Omega-3 Fatty Acids (FISH OIL) 1000 MG CAPS Take 1 capsule by mouth daily.    [name] triamcinolone cream (KENALOG) 0.1 % Apply 1 application topically 2 (two) times daily. 30 g 0   No current facility-administered medications for this visit.      Musculoskeletal: Strength & Muscle Tone: within normal limits Gait & Station: normal Patient leans: N/A  Psychiatric Specialty Exam: Review of Systems  Constitutional: Negative for chills, diaphoresis and fever.  Neurological: Negative for dizziness, tremors, speech change and headaches.    Blood pressure 128/74, pulse 88, height 5\' 6"  (1.676 m), weight 169 lb (76.7 kg).Body mass index is 27.28 kg/m.  General Appearance: Fairly Groomed  Eye Contact:  Good  Speech:  Clear and Coherent and Normal Rate  Volume:  Normal  Mood:  Euthymic  Affect:  Full Range  Thought Process:  Coherent, Goal Directed and Descriptions of Associations: Intact  Orientation:  Full (Time, Place, and Person)  Thought Content: Logical   Suicidal Thoughts:  No  Homicidal Thoughts:  No  Memory:  Immediate;   Good Recent;   Good Remote;   Good  Judgement:  Good  Insight:  Good  Psychomotor Activity:  Normal  Concentration:  Concentration: Good and Attention Span: Good  Recall:  Good  Fund of Knowledge: Good  Language: Good  Akathisia:  No  Handed:  Right  AIMS (if indicated): not done  Assets:  Communication  Skills Desire for Improvement Financial Resources/Insurance Housing Intimacy Leisure Time Social Support Transportation Vocational/Educational  ADL's:  Intact  Cognition: WNL  Sleep:  Good   Screenings: GAD-7     [facility]  Total GAD-7 Score  6    PHQ2-9     [facility]14[facility] Office Visit from 07/14/2016 in [facility] Office Visit from 07/04/2015 in [facility] at [facility] Visit from 05/10/2015 in [facility] Care at [facility] Visit from 11/06/2014 in [facility] Care at [facility] Total Score  1  0  0  0  0  PHQ-9 Total Score  4  -  -  -  -      I reviewed the information below on 10/11/2017 and agree.  Assessment and Plan: ADHD-inattentive type; Tourette's disorder; bulimia nervosa; MDD-resolved      Medication management with supportive therapy. Risks and benefits, side effects and alternative treatment options discussed with patient. Pt was given an opportunity to ask questions about medication, illness, and treatment. All current psychiatric medications have been reviewed and discussed with the patient and adjusted as clinically appropriate. The patient has been provided an accurate and updated list of the medications being now prescribed. Patient expressed understanding of how their medications were to be used.  Pt verbalized understanding and verbal consent obtained for treatment.   The risk of un-intended pregnancy is high based on the fact that pt reports she is not using any form of birth control. Pt is aware that these meds carry a teratogenic risk. Pt will discuss plan of action if she does or plans to become pregnant in the future.   Status of current problems: stable   Meds: Adderall XR 25 mg p.o. every morning for ADHD.  Patient states that this medication also helps to control her Tourette's Adderall 10 mg p.o. every morning for ADHD  Labs: none   Therapy: brief supportive therapy provided. Discussed psychosocial stressors in detail.       Consultations: Encouraged to follow up with therapist Encouraged to follow up with PCP as needed   Pt denies SI and is at an acute low risk for suicide. Patient told to call clinic if any problems occur. Patient advised to go to ER if they should develop SI/HI, side effects, or if symptoms worsen. Has crisis numbers to call if needed. Pt verbalized understanding.   F/up in 3 months or sooner if needed   Oletta DarterSalina Cregg Jutte, MD 10/11/2017, 8:47 AM

## 2017-10-16 ENCOUNTER — Ambulatory Visit (INDEPENDENT_AMBULATORY_CARE_PROVIDER_SITE_OTHER): Payer: BLUE CROSS/BLUE SHIELD | Admitting: Licensed Clinical Social Worker

## 2017-10-16 DIAGNOSIS — F411 Generalized anxiety disorder: Secondary | ICD-10-CM

## 2017-10-17 ENCOUNTER — Encounter (HOSPITAL_COMMUNITY): Payer: Self-pay | Admitting: Licensed Clinical Social Worker

## 2017-10-17 NOTE — Progress Notes (Signed)
   THERAPIST PROGRESS NOTE  Session Time: 4:10-5pm  Participation Level: Active  Behavioral Response: Well GroomedAlertAnxious  Type of Therapy: Individual Therapy  Treatment Goals addressed: Coping  Interventions: CBT  Summary: Cindy Jones is a 27 y.o. female who presents for her individual counseling session. Pt discussed her psychiatric symptoms and current life events.Pt feels her medications are working well with few symptoms. Pt reports she saw her psychiatrist, Dr. Michae Jones, and no meds were changed. Pt had good news to share: she's been accepted to graduate school at [employer]. Congratulated pt on her good news and for going after her dreams! Asked open ended questions. Pt has discussed with her husband the different roles they will play now she is working full time and in school full time. Discussed effective communication skills with pt to use when talking with her husband. Asked open ended questions about the relationship with her mother and sister. Pt is using good boundaries with her family.    Suicidal/Homicidal: Nowithout intent/plan  Therapist Response: Assessed pt's current functioning and reviewed progress. Assisted pt processing stress, grad school, keto diet, work stressor, mindfulness activity.  Assisted pt processing for the management of her stressors.  Plan: Return again in 2 weeks. Bring husband to next session.  Diagnosis: Axis I:  GAD    Cindy Jones S, LCAS 10/16/17

## 2017-10-30 ENCOUNTER — Ambulatory Visit (INDEPENDENT_AMBULATORY_CARE_PROVIDER_SITE_OTHER): Payer: BLUE CROSS/BLUE SHIELD | Admitting: Licensed Clinical Social Worker

## 2017-10-30 DIAGNOSIS — F411 Generalized anxiety disorder: Secondary | ICD-10-CM

## 2017-10-31 ENCOUNTER — Encounter (HOSPITAL_COMMUNITY): Payer: Self-pay | Admitting: Licensed Clinical Social Worker

## 2017-10-31 NOTE — Progress Notes (Signed)
   THERAPIST PROGRESS NOTE  Session Time: 4:10-5pm  Participation Level: Active  Behavioral Response: Well GroomedAlertAnxious  Type of Therapy: Individual Therapy  Treatment Goals addressed: Coping  Interventions: CBT  Summary: Maryjean Kamily H Korff is a 27 y.o. female who presents for her counseling session with her husband, as planned. Pt is beginning graduate school in the fall and wanted her husband to accompany her to a counseling session. She wanted a safe place where they could talk to each other and be heard. She wanted to discuss expectations. Since they have been married she has taken care of everything while he went to class and studied. Her expectation is he will now take care of everything while she does the same. Pt identified her triggers for stress and anxiety, which will take place during the 2.5 years while she is in school. Her husband shared his thoughts and feelings about what is being asked of him. There was effective communication between the two. Asked open ended questions and used empathic reflection throughout their communicating with each other. Both parties were happy with the outcome of the session.      Suicidal/Homicidal: Nowithout intent/plan  Therapist Response: Assessed pt's current functioning and reviewed progress. Assisted pt and her husband providing a safe place for communication between the two, processing stress, grad school, expectations.  Assisted pt processing for the management of her stressors.  Plan: Return again in 2 weeks.  Diagnosis: Axis I:  GAD    Cordarius Benning S, LCAS 10/30/17

## 2017-11-05 ENCOUNTER — Encounter: Payer: Self-pay | Admitting: Obstetrics & Gynecology

## 2017-11-05 ENCOUNTER — Ambulatory Visit: Payer: BLUE CROSS/BLUE SHIELD | Admitting: Obstetrics & Gynecology

## 2017-11-05 VITALS — BP 122/69 | HR 71 | Wt 170.5 lb

## 2017-11-05 DIAGNOSIS — N930 Postcoital and contact bleeding: Secondary | ICD-10-CM

## 2017-11-05 MED ORDER — METRONIDAZOLE 500 MG PO TABS: 500.0000 mg | ORAL_TABLET | Freq: Two times a day (BID) | ORAL | 0 refills | Status: DC

## 2017-11-05 NOTE — Progress Notes (Signed)
   Subjective:    Patient ID: Cindy Jones, female    DOB: 1990-06-11, 27 y.o.   MRN: [id number]  HPI  27 yo married P here today with issue of post coital spotting since 9/18. It occurs only sometimes after sex. She thinks that it is more common in certain positions. She hasn't tried any treatments. It is mainly spotting with wiping after sex. She has been with her current partner since 2015.She had PID in 2015 but cervical cultures were negative 9/18  Review of Systems paps normal since 2015    Objective:   Physical Exam  Breathing, conversing, and ambulating normally Well nourished, well hydrated White female, no apparent distress Ectropion noted at the right hand side of the cervix Discharge c/w BV      Assessment & Plan:  BV- treat with flagyl and rec boric acid Post coital spotting- related to ectopion is the etiology, I believe Reassurance given

## 2017-12-05 ENCOUNTER — Encounter

## 2017-12-05 ENCOUNTER — Ambulatory Visit (INDEPENDENT_AMBULATORY_CARE_PROVIDER_SITE_OTHER): Payer: BLUE CROSS/BLUE SHIELD | Admitting: Licensed Clinical Social Worker

## 2017-12-05 ENCOUNTER — Encounter (HOSPITAL_COMMUNITY): Payer: Self-pay | Admitting: Licensed Clinical Social Worker

## 2017-12-05 DIAGNOSIS — F411 Generalized anxiety disorder: Secondary | ICD-10-CM | POA: Diagnosis not present

## 2017-12-05 NOTE — Progress Notes (Signed)
   THERAPIST PROGRESS NOTE  Session Time: 4:10-5pm  Participation Level: Active  Behavioral Response: Well GroomedAlertAnxious  Type of Therapy: Individual Therapy  Treatment Goals addressed: Coping  Interventions: CBT  Summary: Maryjean Kamily H Boothe is a 27 y.o. female who presents for her counseling session. Pt has not been to therapy in 30 days due to scheduling issues with pt. She had a lot to share and was very anxious. She has been having a lot of anxiety at work. Discussed the issues at work and what she is doing to deal with her anxiety and stress while in the office. Pt had a journal with her that she had just purchased so she says she will start journaling now. Pt is set to start masters program at [employer] on 8/20. Educated pt on mindfulness skills which should mitigate some of her fears about school, feelings of being overwhelmed and job balance.         Suicidal/Homicidal: Nowithout intent/plan  Therapist Response: Assessed pt's current functioning and reviewed progress. Assisted pt processing current stressors and mindfulness activities to assist with the anxiety and stress.  Assisted pt processing for the management of her stressors.  Plan: Return again in 2 weeks.  Diagnosis: Axis I:  GAD    Ahlam Piscitelli S, LCAS 12/05/17

## 2017-12-17 ENCOUNTER — Ambulatory Visit (INDEPENDENT_AMBULATORY_CARE_PROVIDER_SITE_OTHER): Payer: BLUE CROSS/BLUE SHIELD | Admitting: Licensed Clinical Social Worker

## 2017-12-17 DIAGNOSIS — F411 Generalized anxiety disorder: Secondary | ICD-10-CM | POA: Diagnosis not present

## 2017-12-18 ENCOUNTER — Encounter (HOSPITAL_COMMUNITY): Payer: Self-pay | Admitting: Licensed Clinical Social Worker

## 2017-12-18 NOTE — Progress Notes (Signed)
   THERAPIST PROGRESS NOTE  Session Time: 4:10-5pm  Participation Level: Active  Behavioral Response: Well GroomedAlertAnxious  Type of Therapy: Individual Therapy  Treatment Goals addressed: Coping  Interventions: CBT  Summary: Cindy Jones is a 27 y.o. female who presents for her counseling session. Pt reports her meds are working well. She sees her psychiatrist Dr. Michae KavaAgarwal in September. Pt presents anxious today. She has written in her journal. She read from it and we processed her feelings. Asked open ended questions. Pt has been having issues with her mother, tenuous relationship. Pt has put up boundaries with her mother but she keeps ignoring her boundaries. Pt has lots of feelings about her childhood and the role her mother played into it. Asked pt what she wants from her mother, what will make her feel better about her childhood. Pt wants her mother to come to a session or 2 to have a safe place to talk to her mother. Processed with pt, her understanding of what a session would look like, what she hopes to accomplish. Suggested to pt that she write down her thoughts and feelings and we would discuss it at next session. Pt begins graduate school tomorrow. She is anxious and excited. Asked open ended questions. Encouraged pt to continue to use her mindfulness activities when she becomes anxious. Encouraged pt to continue to write in her journal.   Suicidal/Homicidal: Nowithout intent/plan  Therapist Response: Assessed pt's current functioning and reviewed progress. Assisted pt processing current stressors and mindfulness activities to assist with the anxiety and stress, issues and boundaries with mother, journaling.  Assisted pt processing for the management of her stressors.  Plan: Return again in 2 weeks.  Diagnosis: Axis I:  GAD    Ahmed Inniss S, LCAS 12/17/17

## 2018-01-16 ENCOUNTER — Ambulatory Visit (INDEPENDENT_AMBULATORY_CARE_PROVIDER_SITE_OTHER): Payer: BLUE CROSS/BLUE SHIELD | Admitting: Licensed Clinical Social Worker

## 2018-01-16 ENCOUNTER — Encounter (HOSPITAL_COMMUNITY): Payer: Self-pay | Admitting: Licensed Clinical Social Worker

## 2018-01-16 DIAGNOSIS — F411 Generalized anxiety disorder: Secondary | ICD-10-CM | POA: Diagnosis not present

## 2018-01-16 NOTE — Progress Notes (Signed)
   THERAPIST PROGRESS NOTE  Session Time: 5:10-6pm  Participation Level: Active  Behavioral Response: Well GroomedAlertAnxious  Type of Therapy: Individual Therapy  Treatment Goals addressed: Improve psychiatric symptoms, Controlled Behavior, Moderated Mood, Improve Unhelpful Thought Patterns, Emotional Regulation Skills (Moderate moods, anger management, stress management), Feel and express a full Range of Emotions, Learn about Diagnosis, Healthy Coping Skills.  Interventions: CBT  Summary: Cindy Jones is a 27 y.o. female who presents for her counseling session. Pt reports she has some questions about her meds and sees her psychiatrist Dr. Michae KavaAgarwal tomorrow. Suggested to pt she write down her questions for psychiatrist and take with her to appointment. Pt has started graduate school and has begun having panic attacks several mornings per week which have been making her late for work. She has spoken to her manager about her mental wellness. Discussed the benefits of intermittent FMLA with pt. She does not believe she needs it yet. Pt is interested in talking to her psychiatrist about meds for anxiety and panic attacks. However, noted she is not experiencing any social anxiety at school. Pt reports feeling overwhelmed in all areas of her life. Processed with pt giving herself permission to not be perfect, to miss a part of school or work if life comes up. She and her husband have struggled due to her lack of control over all areas of her life. Gave pt several handouts and discussed many mindfulness activities that she can use to be present in the moment. Discussed the benefits of meditation early in the morning which may alleviate the onset of panic attacks.     Suicidal/Homicidal: Nowithout intent/plan  Therapist Response: Assessed pt's current functioning and reviewed progress. Assisted pt processing current stressors and mindfulness activities to assist with the anxiety and stress, school,  family and work life balance, self care, medications.  Assisted pt processing for the management of her stressors.  Plan: Return again in 2 weeks.  Diagnosis: Axis I:  GAD    Ezra Denne S, LCAS 01/16/18

## 2018-01-17 ENCOUNTER — Encounter (HOSPITAL_COMMUNITY): Payer: Self-pay | Admitting: Psychiatry

## 2018-01-17 ENCOUNTER — Encounter

## 2018-01-17 ENCOUNTER — Ambulatory Visit (INDEPENDENT_AMBULATORY_CARE_PROVIDER_SITE_OTHER): Payer: BLUE CROSS/BLUE SHIELD | Admitting: Psychiatry

## 2018-01-17 VITALS — BP 126/70 | Ht 66.0 in | Wt 167.0 lb

## 2018-01-17 DIAGNOSIS — F952 Tourette's disorder: Secondary | ICD-10-CM | POA: Diagnosis not present

## 2018-01-17 DIAGNOSIS — F430 Acute stress reaction: Secondary | ICD-10-CM

## 2018-01-17 DIAGNOSIS — Z818 Family history of other mental and behavioral disorders: Secondary | ICD-10-CM

## 2018-01-17 DIAGNOSIS — F410 Panic disorder [episodic paroxysmal anxiety] without agoraphobia: Secondary | ICD-10-CM | POA: Diagnosis not present

## 2018-01-17 DIAGNOSIS — F988 Other specified behavioral and emotional disorders with onset usually occurring in childhood and adolescence: Secondary | ICD-10-CM | POA: Diagnosis not present

## 2018-01-17 DIAGNOSIS — Z79899 Other long term (current) drug therapy: Secondary | ICD-10-CM

## 2018-01-17 DIAGNOSIS — Z811 Family history of alcohol abuse and dependence: Secondary | ICD-10-CM

## 2018-01-17 MED ORDER — PROPRANOLOL HCL 10 MG PO TABS: 10.0000 mg | ORAL_TABLET | Freq: Every day | ORAL | 1 refills | Status: DC | PRN

## 2018-01-17 MED ORDER — AMPHETAMINE-DEXTROAMPHETAMINE 20 MG PO TABS: 20.0000 mg | ORAL_TABLET | Freq: Every day | ORAL | 0 refills | Status: DC

## 2018-01-17 MED ORDER — AMPHETAMINE-DEXTROAMPHET ER 25 MG PO CP24: 25.0000 mg | ORAL_CAPSULE | Freq: Every day | ORAL | 0 refills | Status: DC

## 2018-01-17 MED ORDER — AMPHETAMINE-DEXTROAMPHET ER 25 MG PO CP24: 25.0000 mg | ORAL_CAPSULE | ORAL | 0 refills | Status: DC

## 2018-01-17 NOTE — Progress Notes (Signed)
BH MD/PA/NP OP Progress Note  01/17/2018 4:54 PM Cindy Jones  MRN:  [id number]  Chief Complaint:  Chief Complaint    Anxiety     HPI: Panic attacks began about 5 weeks ago when school started. The last one week she has had one near daily.  Does that they are stress related.  They typically occur in the morning as it gets closer and closer to the time where she must leave for work.  Patient states that she is overwhelmed by a number of stressors in her life including work and school and moving.  She states that she has chest pain-as though something is sitting on her chest-, palpitations, difficulty breathing, tingling in her hands, irritability and crying.  It can last for 20-40 minutes and afterwards she feels exhausted.  Patient has been like to work several times due to panic attacks.  Patient is denying any depression symptoms including anhedonia, isolation, hopelessness.  Sleep is good but it does not feel as restful as before.  She is taking melatonin drops at bedtime.  She denies SI/HI.  Patient had stopped her keto diet but restarted it a few days ago.  She denies any binging episodes since our last visit.  Patient is taking 25 mg of Adderall in the morning and states that she is productive at work.  She has classes for 3 hours on Tuesday and Thursday evenings.  She tried taking Adderall 10 mg before class but states that it was ineffective and is asking for a dose increase.  She denies any recent tics.   Visit Diagnosis:    ICD-10-CM   1. Panic attack as reaction to stress F41.0 propranolol (INDERAL) 10 MG tablet   F43.0 DISCONTINUED: propranolol (INDERAL) 10 MG tablet    DISCONTINUED: propranolol (INDERAL) 10 MG tablet  2. 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 Tourette's disease F95.2       Past Psychiatric History: Anxiety: Yes Bipolar Disorder: No Depression: Yes Mania: No Psychosis: No Schizophrenia: No Personality Disorder: No Hospitalization for psychiatric illness: No History of Electroconvulsive Shock Therapy: No Prior Suicide Attempts: No   Past Medical History:  Past Medical History:  Diagnosis Date  . Anxiety   . Asthma    as a child  . Binge eating disorder   . Dysrhythmia    " I feel it about once a month"  . Frequent headaches   . Migraines   . PID (acute pelvic inflammatory disease)   . Seasonal allergies   . Tourette disorder   . Urinary tract bacterial infections     Past Surgical History:  Procedure Laterality Date  . CHOLECYSTECTOMY    . ESOPHAGOGASTRODUODENOSCOPY    . LAPAROSCOPIC CHOLECYSTECTOMY SINGLE SITE WITH INTRAOPERATIVE CHOLANGIOGRAM N/A 03/15/2016   Procedure: LAPAROSCOPIC CHOLECYSTECTOMY SINGLE SITE WITH INTRAOPERATIVE CHOLANGIOGRAM;  Surgeon: Karie Soda, MD;  Location: [facility] OR;  Service: General;  Laterality: N/A;  . TONSILLECTOMY AND ADENOIDECTOMY  2013  . WISDOM TOOTH EXTRACTION      Family Psychiatric History: Family History  Problem Relation Age of Onset  .  Alcohol abuse Mother   . Bipolar disorder Mother   . Cancer Father        sarcoma; passed away when pt was 8  . Tourette syndrome Maternal Aunt   . Alcohol abuse Maternal Aunt   . Hyperlipidemia Paternal Aunt   . Tourette syndrome Maternal Grandfather   . Heart disease Paternal Grandfather   . Alcohol abuse Maternal Uncle   . Suicidality Neg Hx     Social History:  Social History    Socioeconomic History  . Marital status: Married    Spouse name: Not on file  . Number of children: 0  . Years of education: Not on file  . Highest education level: Not on file  Occupational History  . Occupation: Systems developer  Social Needs  . Financial resource strain: Not on file  . Food insecurity:    Worry: Not on file    Inability: Not on file  . Transportation needs:    Medical: Not on file    Non-medical: Not on file  Tobacco Use  . Smoking status: Never Smoker  . Smokeless tobacco: Never Used  Substance and Sexual Activity  . Alcohol use: Yes    Alcohol/week: 1.0 standard drinks    Types: 1 Glasses of wine per week    Comment: occ  . Drug use: No  . Sexual activity: Yes    Partners: Male    Birth control/protection: None, Condom    Comment: Married  Lifestyle  . Physical activity:    Days per week: Not on file    Minutes per session: Not on file  . Stress: Not on file  Relationships  . Social connections:    Talks on phone: Not on file    Gets together: Not on file    Attends religious service: Not on file    Active member of club or organization: Not on file    Attends meetings of clubs or organizations: Not on file    Relationship status: Not on file  Other Topics Concern  . Not on file  Social History Narrative   - Married, no children.   Psychiatrist education.   - She works FT as a Patent attorney.   - Her aunt & uncle were her guardians when she was in HS. They live in [place], [place].    - She has a fraternal twin sister.    - Wears her seatbelt, smoke detectors in the home.    Allergies:  Allergies  Allergen Reactions  . Latex Swelling and Rash  . Cranberry Juice Powder Other (See Comments)    Throat pain.  . Naproxen Other (See Comments)    "Liver starts hurting" Diarrhea  . Nickel     Skin peeling  . Sulfa Antibiotics Hives and Swelling    Metabolic Disorder Labs: Lab Results  Component Value Date   HGBA1C 4.8 07/14/2016   MPG 91  07/14/2016   No results found for: PROLACTIN No results found for: CHOL, TRIG, HDL, CHOLHDL, VLDL, LDLCALC Lab Results  Component Value Date   TSH 1.44 07/14/2016    Therapeutic Level Labs: No results found for: LITHIUM No results found for: VALPROATE No components found for:  CBMZ  Current Medications: Current Outpatient Medications  Medication Sig Dispense Refill  . amphetamine-dextroamphetamine (ADDERALL XR) 25 MG 24 hr capsule Take 1 capsule by mouth daily. 30 capsule 0  . amphetamine-dextroamphetamine (ADDERALL) 20 MG tablet Take 1 tablet (20 mg total) by mouth daily after lunch.  30 tablet 0  . aspirin-acetaminophen-caffeine (EXCEDRIN MIGRAINE) 250-250-65 MG per tablet Take 1-2 tablets by mouth every 6 (six) hours as needed for headache.     . Omega-3 Fatty Acids (FISH OIL) 1000 MG CAPS Take 1 capsule by mouth daily.    [name] amphetamine-dextroamphetamine (ADDERALL XR) 25 MG 24 hr capsule Take 1 capsule by mouth every morning. 30 capsule 0  . amphetamine-dextroamphetamine (ADDERALL) 20 MG tablet Take 1 tablet (20 mg total) by mouth daily with lunch. 30 tablet 0  . metroNIDAZOLE (FLAGYL) 500 MG tablet Take 1 tablet (500 mg total) by mouth 2 (two) times daily. (Patient not taking: Reported on 01/17/2018) 14 tablet 0  . propranolol (INDERAL) 10 MG tablet Take 1 tablet (10 mg total) by mouth daily as needed. 30 tablet 1  . triamcinolone cream (KENALOG) 0.1 % Apply 1 application topically 2 (two) times daily. (Patient not taking: Reported on 01/17/2018) 30 g 0   No current facility-administered medications for this visit.      Musculoskeletal: Strength & Muscle Tone: within normal limits Gait & Station: normal Patient leans: N/A  Psychiatric Specialty Exam: Review of Systems  Respiratory: Positive for shortness of breath. Negative for cough and hemoptysis.   Cardiovascular: Positive for chest pain and palpitations. Negative for leg swelling.    Blood pressure 126/70, height 5\' 6"   (1.676 m), weight 167 lb (75.8 kg).Body mass index is 26.95 kg/m.  General Appearance: Fairly Groomed  Eye Contact:  Good  Speech:  Clear and Coherent and Normal Rate  Volume:  Normal  Mood:  Anxious  Affect:  Congruent  Thought Process:  Goal Directed and Descriptions of Associations: Intact  Orientation:  Full (Time, Place, and Person)  Thought Content:  Logical  Suicidal Thoughts:  No  Homicidal Thoughts:  No  Memory:  Immediate;   Good  Judgement:  Good  Insight:  Good  Psychomotor Activity:  Normal  Concentration:  Concentration: Good  Recall:  Good  Fund of Knowledge:  Good  Language:  Good  Akathisia:  No  Handed:  Right  AIMS (if indicated):     Assets:  Communication Skills Desire for Improvement Financial Resources/Insurance Housing Intimacy Physical Health Social Support Transportation Vocational/Educational  ADL's:  Intact  Cognition:  WNL  Sleep:   fair     Screenings: GAD-7     Office Visit from 11/05/2017 in [facility] Office Visit [facility]14[facility]2017 [facility]  Total GAD-7 Score  4  6    PHQ2-9     Office Visit from 11/05/2017 in [facility] Office Visit from 02/14/2017 in [facility] Office Visit from 07/14/2016 in [facility] Office Visit from 07/04/2015 in Primary Care at [facility] Visit from 05/10/2015 in Primary Care at [facility] Total Score  0  1  0  0  0  PHQ-9 Total Score  2  4  -  -  -     Reviewed the information below on 01/17/2018 and have updated it Assessment and Plan: Panic attacks;  ADHD-inattentive type; Tourette's disorder; bulimia nervosa; MDD-resolved      Medication management with supportive therapy. Risks and benefits, side effects and alternative treatment options discussed with patient. Pt was given an opportunity to ask questions about medication, illness, and treatment. All current psychiatric medications  have been reviewed and discussed with the patient and adjusted as clinically appropriate. The patient has been provided an accurate and  updated list of the medications being now prescribed. Patient expressed understanding of how their medications were to be used.  Pt verbalized understanding and verbal consent obtained for treatment.   The risk of un-intended pregnancy is high based on the fact that pt reports she is not using any form of birth control. Pt is aware that these meds carry a teratogenic risk. Pt will discuss plan of action if she does or plans to become pregnant in the future.   Status of current problems: panic attacks   Meds: Adderall XR 25 mg p.o. every morning for ADHD.  Patient states that this medication also helps to control her Tourette's Increase Adderall 20 mg p.o. qD for ADHD when in class- given 2 printed scripts Start trail of Propanolol 10mg  po qD prn anxiety- given 1 printed script - Reports that there is a family history of addiction therefore she does not want to take any medication with the abuse potential.  She also does not want to take anything that is sedating.  Labs: none   Therapy: brief supportive therapy provided. Discussed psychosocial stressors in detail.    pt advised to use Calm or Headspace app to practice coping skills for anxiety   Consultations: Encouraged to follow up with therapist Encouraged to follow up with PCP as needed   Pt denies SI and is at an acute low risk for suicide. Patient told to call clinic if any problems occur. Patient advised to go to ER if they should develop SI/HI, side effects, or if symptoms worsen. Has crisis numbers to call if needed. Pt verbalized understanding.   F/up in 2 months or sooner if needed   Oletta DarterSalina Renetta Suman, MD 01/17/2018, 4:54 PM

## 2018-01-30 ENCOUNTER — Ambulatory Visit (INDEPENDENT_AMBULATORY_CARE_PROVIDER_SITE_OTHER): Payer: BLUE CROSS/BLUE SHIELD | Admitting: Licensed Clinical Social Worker

## 2018-01-30 ENCOUNTER — Encounter (HOSPITAL_COMMUNITY): Payer: Self-pay | Admitting: Licensed Clinical Social Worker

## 2018-01-30 DIAGNOSIS — F410 Panic disorder [episodic paroxysmal anxiety] without agoraphobia: Secondary | ICD-10-CM | POA: Diagnosis not present

## 2018-01-30 DIAGNOSIS — F430 Acute stress reaction: Secondary | ICD-10-CM

## 2018-01-30 NOTE — Progress Notes (Signed)
   THERAPIST PROGRESS NOTE  Session Time: 5:10-6pm  Participation Level: Active  Behavioral Response: Well GroomedAlertAnxious  Type of Therapy: Individual Therapy  Treatment Goals addressed: Improve psychiatric symptoms, Controlled Behavior, Moderated Mood, Improve Unhelpful Thought Patterns, Emotional Regulation Skills (Moderate moods, anger management, stress management), Feel and express a full Range of Emotions, Learn about Diagnosis, Healthy Coping Skills.  Interventions: CBT  Summary: Cindy Jones is a 27 y.o. female who presents for her counseling session. Pt reports she saw her psychiatrist and talked with her about her panic attacks. She prescribed propanolol for her panic attacks on an as needed basis. Pt reports she has continued to have them almost every morning before going to work. Worked with pt on some mindfulness skills to use in the mornings, also processed with pt the importance of pre-planning before going to bed to prepare for the next morning. Pt shared she and husband are arguing more and she attributes it to her being in school and his resentments. Role played communication skills with pt. Pt is struggling with the stress of working a full time job, moving, marriage, graduate school. Suggested to pt to review all the mindfulness handouts given to her at last session and apply them.        Suicidal/Homicidal: Nowithout intent/plan  Therapist Response: Assessed pt's current functioning and reviewed progress. Assisted pt processing current stressors and mindfulness activities to assist with the anxiety and stress, school, family and work life balance.  Assisted pt processing for the management of her stressors.  Plan: Return again in 2 weeks.  Diagnosis: Axis I:  GAD    [name],[name], LCAS 01/30/18

## 2018-02-13 ENCOUNTER — Ambulatory Visit (INDEPENDENT_AMBULATORY_CARE_PROVIDER_SITE_OTHER): Payer: BLUE CROSS/BLUE SHIELD | Admitting: Licensed Clinical Social Worker

## 2018-02-13 DIAGNOSIS — F411 Generalized anxiety disorder: Secondary | ICD-10-CM | POA: Diagnosis not present

## 2018-02-14 ENCOUNTER — Encounter (HOSPITAL_COMMUNITY): Payer: Self-pay | Admitting: Licensed Clinical Social Worker

## 2018-02-14 NOTE — Progress Notes (Signed)
   THERAPIST PROGRESS NOTE  Session Time: 5:10-6pm  Participation Level: Active  Behavioral Response: Well GroomedAlertAnxious  Type of Therapy: Individual Therapy  Treatment Goals addressed: Improve psychiatric symptoms, Controlled Behavior, Moderated Mood, Improve Unhelpful Thought Patterns, Emotional Regulation Skills (Moderate moods, anger management, stress management), Feel and express a full Range of Emotions, Learn about Diagnosis, Healthy Coping Skills.  Interventions: CBT  Summary: Cindy Jones is a 27 y.o. female who presents for her counseling session. Pt discussed her psychiatric symptoms and current life events. Pt reported she is no longer having panic attacks in the mornings before work. She shared she is not as vested in school as she was in the beginning. Worked with pt on work/school life balance. Discussed with pt about adjustment and working through changes: marriage, job, school, studying. She has taken suggestions into considerations and worked on change talk with her husband and it has worked well. Had adult conversations about his feelings and resentments. "I have chosen to be more involved in my marriage." Continue to suggest the importance of mindfulness skills in this transition and adjustment.          Suicidal/Homicidal: Nowithout intent/plan  Therapist Response: Assessed pt's current functioning and reviewed progress. Assisted pt processing current stressors and mindfulness activities to assist with the anxiety and stress, school, family and work life balance.  Assisted pt processing for the management of her stressors.  Plan: Return again in 2 weeks.  Diagnosis: Axis I:  GAD    [name],[name], LCAS 02/13/18

## 2018-02-27 ENCOUNTER — Ambulatory Visit (HOSPITAL_COMMUNITY): Payer: Self-pay | Admitting: Licensed Clinical Social Worker

## 2018-03-06 ENCOUNTER — Ambulatory Visit (INDEPENDENT_AMBULATORY_CARE_PROVIDER_SITE_OTHER): Payer: BLUE CROSS/BLUE SHIELD | Admitting: Licensed Clinical Social Worker

## 2018-03-06 DIAGNOSIS — F411 Generalized anxiety disorder: Secondary | ICD-10-CM | POA: Diagnosis not present

## 2018-03-07 ENCOUNTER — Encounter (HOSPITAL_COMMUNITY): Payer: Self-pay | Admitting: Licensed Clinical Social Worker

## 2018-03-07 NOTE — Progress Notes (Signed)
   THERAPIST PROGRESS NOTE  Session Time: 5:30-6:20pm  Participation Level: Active  Behavioral Response: Well GroomedAlertAnxious  Type of Therapy: Individual Therapy  Treatment Goals addressed: Improve psychiatric symptoms, Controlled Behavior, Moderated Mood, Improve Unhelpful Thought Patterns, Emotional Regulation Skills (Moderate moods, anger management, stress management), Feel and express a full Range of Emotions, Learn about Diagnosis, Healthy Coping Skills.  Interventions: CBT  Summary: Cindy Jones is a 27 y.o. female who presents for her counseling session. Pt discussed her psychiatric symptoms and current life events. Pt reported she is no longer having panic attacks in the mornings before work. She shared she is not as vested in school as she was in the beginning, she even missed yesterday. She has decided to take 1 course next semester which should assist with her anxiety and panic attacks. She and her husband are communicating better and she is using I statements and not yell when communicating. Today pt wanted to delve more into the relationship she has with her mother and what future relationship she would like to have. Asked open ended questions and used empathic reflection. Pt wants her mother to come to a therapy session so she can have communication with her mother in a safe environment. Discussed with pt what she would like the conversation with her mother to address. Educated pt on 5 things activity that she can add to her mindfulness skills tool box.               Suicidal/Homicidal: Nowithout intent/plan  Therapist Response: Assessed pt's current functioning and reviewed progress. Assisted pt processing current stressors and mindfulness activities to assist with the anxiety and stress, school, communication, mother  Assisted pt processing for the management of her stressors.  Plan: Return again in 2 weeks.  Diagnosis: Axis I:  GAD    , S,  LCAS 03/06/18

## 2018-03-14 ENCOUNTER — Ambulatory Visit (INDEPENDENT_AMBULATORY_CARE_PROVIDER_SITE_OTHER): Payer: BLUE CROSS/BLUE SHIELD | Admitting: Psychiatry

## 2018-03-14 ENCOUNTER — Encounter (HOSPITAL_COMMUNITY): Payer: Self-pay | Admitting: Psychiatry

## 2018-03-14 DIAGNOSIS — F988 Other specified behavioral and emotional disorders with onset usually occurring in childhood and adolescence: Secondary | ICD-10-CM | POA: Diagnosis not present

## 2018-03-14 DIAGNOSIS — F502 Bulimia nervosa: Secondary | ICD-10-CM

## 2018-03-14 DIAGNOSIS — F952 Tourette's disorder: Secondary | ICD-10-CM

## 2018-03-14 DIAGNOSIS — F900 Attention-deficit hyperactivity disorder, predominantly inattentive type: Secondary | ICD-10-CM | POA: Diagnosis not present

## 2018-03-14 DIAGNOSIS — F332 Major depressive disorder, recurrent severe without psychotic features: Secondary | ICD-10-CM

## 2018-03-14 MED ORDER — AMPHETAMINE-DEXTROAMPHET ER 25 MG PO CP24: 25.0000 mg | ORAL_CAPSULE | ORAL | 0 refills | Status: DC

## 2018-03-14 MED ORDER — AMPHETAMINE-DEXTROAMPHET ER 25 MG PO CP24: 25.0000 mg | ORAL_CAPSULE | Freq: Every day | ORAL | 0 refills | Status: DC

## 2018-03-14 NOTE — Progress Notes (Signed)
BH MD/PA/NP OP Progress Note  03/14/2018 2:27 PM Cindy Jones  MRN:  [id number]  Chief Complaint:  Chief Complaint    Follow-up     HPI: Patient reports that overall she is doing better.  Her panic attacks have improved over the last 2 weeks.  When she was extremely stressed a month ago she was having very frequent panic attacks.  She tried the propranolol and it did calm her down but gave her severe headaches.  As a result she stopped taking it does not want to restart.  Her stress level is high with work and school and she is looking forward to a break over Thanksgiving and Christmas.  Next semester she plans to only take 1 class.  She feels as though all of her time is accounted for between work school and homework assignments she does not really get a break.  She has been taking her Adderall 25 mg daily and states that still effective.  She does take 20 mg a couple times a week when she has classes in the evenings.  Things at home have calmed down and that helps.  She was noticing some tics that occurred when she was very stressed.  Recently she has not noticed any.  She denies any depression, hopelessness, anhedonia.  She denies SI/HI.  She is denying any binging episodes-states she is too busy to even think about that.  Visit Diagnosis:    ICD-10-CM   1. ADD (attention deficit disorder) without hyperactivity F98.8 amphetamine-dextroamphetamine (ADDERALL XR) 25 MG 24 hr capsule    amphetamine-dextroamphetamine (ADDERALL XR) 25 MG 24 hr capsule    amphetamine-dextroamphetamine (ADDERALL XR) 25 MG 24 hr capsule      Past Psychiatric History: Anxiety: Yes Bipolar Disorder: No Depression: Yes Mania: No Psychosis: No Schizophrenia: No Personality Disorder: No Hospitalization for psychiatric illness: No History of Electroconvulsive Shock Therapy: No Prior Suicide Attempts: No   Past Medical History:  Past Medical History:  Diagnosis Date  . Anxiety   . Asthma    as a child  .  Binge eating disorder   . Dysrhythmia    " I feel it about once a month"  . Frequent headaches   . Migraines   . PID (acute pelvic inflammatory disease)   . Seasonal allergies   . Tourette disorder   . Urinary tract bacterial infections     Past Surgical History:  Procedure Laterality Date  . CHOLECYSTECTOMY    . ESOPHAGOGASTRODUODENOSCOPY    . LAPAROSCOPIC CHOLECYSTECTOMY SINGLE SITE WITH INTRAOPERATIVE CHOLANGIOGRAM N/A 03/15/2016   Procedure: LAPAROSCOPIC CHOLECYSTECTOMY SINGLE SITE WITH INTRAOPERATIVE CHOLANGIOGRAM;  Surgeon: Karie Soda, MD;  Location: [facility] OR;  Service: General;  Laterality: N/A;  . TONSILLECTOMY AND ADENOIDECTOMY  2013  . WISDOM TOOTH EXTRACTION      Family Psychiatric History: Family History  Problem Relation Age of Onset  . Alcohol abuse Mother   . Bipolar disorder Mother   . Cancer Father        sarcoma; passed away when pt was 8  . Tourette syndrome Maternal Aunt   . Alcohol abuse Maternal Aunt   . Hyperlipidemia Paternal Aunt   . Tourette syndrome Maternal Grandfather   . Heart disease Paternal Grandfather   . Alcohol abuse Maternal Uncle   . Suicidality Neg Hx     Social History:  Social History   Socioeconomic History  . Marital status: Married    Spouse name: Not on file  . Number of children: 0  .  Years of education: Not on file  . Highest education level: Not on file  Occupational History  . Occupation: Systems developer  Social Needs  . Financial resource strain: Not on file  . Food insecurity:    Worry: Not on file    Inability: Not on file  . Transportation needs:    Medical: Not on file    Non-medical: Not on file  Tobacco Use  . Smoking status: Never Smoker  . Smokeless tobacco: Never Used  Substance and Sexual Activity  . Alcohol use: Yes    Alcohol/week: 1.0 standard drinks    Types: 1 Glasses of wine per week    Comment: occ  . Drug use: No  . Sexual activity: Yes    Partners: Male    Birth control/protection: None,  Condom    Comment: Married  Lifestyle  . Physical activity:    Days per week: Not on file    Minutes per session: Not on file  . Stress: Not on file  Relationships  . Social connections:    Talks on phone: Not on file    Gets together: Not on file    Attends religious service: Not on file    Active member of club or organization: Not on file    Attends meetings of clubs or organizations: Not on file    Relationship status: Not on file  Other Topics Concern  . Not on file  Social History Narrative   - Married, no children.   Psychiatrist education.   - She works FT as a Patent attorney.   - Her aunt & uncle were her guardians when she was in HS. They live in [place], [place].    - She has a fraternal twin sister.    - Wears her seatbelt, smoke detectors in the home.    Allergies:  Allergies  Allergen Reactions  . Latex Swelling and Rash  . Cranberry Juice Powder Other (See Comments)    Throat pain.  . Naproxen Other (See Comments)    "Liver starts hurting" Diarrhea  . Nickel     Skin peeling  . Sulfa Antibiotics Hives and Swelling    Metabolic Disorder Labs: Lab Results  Component Value Date   HGBA1C 4.8 07/14/2016   MPG 91 07/14/2016   No results found for: PROLACTIN No results found for: CHOL, TRIG, HDL, CHOLHDL, VLDL, LDLCALC Lab Results  Component Value Date   TSH 1.44 07/14/2016    Therapeutic Level Labs: No results found for: LITHIUM No results found for: VALPROATE No components found for:  CBMZ  Current Medications: Current Outpatient Medications  Medication Sig Dispense Refill  . amphetamine-dextroamphetamine (ADDERALL XR) 25 MG 24 hr capsule Take 1 capsule by mouth every morning. 30 capsule 0  . amphetamine-dextroamphetamine (ADDERALL XR) 25 MG 24 hr capsule Take 1 capsule by mouth daily. 30 capsule 0  . amphetamine-dextroamphetamine (ADDERALL) 20 MG tablet Take 1 tablet (20 mg total) by mouth daily after lunch. 30 tablet 0  .  amphetamine-dextroamphetamine (ADDERALL) 20 MG tablet Take 1 tablet (20 mg total) by mouth daily with lunch. 30 tablet 0  . aspirin-acetaminophen-caffeine (EXCEDRIN MIGRAINE) 250-250-65 MG per tablet Take 1-2 tablets by mouth every 6 (six) hours as needed for headache.     . metroNIDAZOLE (FLAGYL) 500 MG tablet Take 1 tablet (500 mg total) by mouth 2 (two) times daily. 14 tablet 0  . Omega-3 Fatty Acids (FISH OIL) 1000 MG CAPS Take 1 capsule by mouth daily.    [name]  triamcinolone cream (KENALOG) 0.1 % Apply 1 application topically 2 (two) times daily. 30 g 0  . amphetamine-dextroamphetamine (ADDERALL XR) 25 MG 24 hr capsule Take 1 capsule by mouth every morning. 30 capsule 0   No current facility-administered medications for this visit.      Musculoskeletal: Strength & Muscle Tone: within normal limits Gait & Station: normal Patient leans: none   Psychiatric Specialty Exam: Review of Systems  Constitutional: Negative for chills, diaphoresis and fever.  HENT: Negative for congestion, sinus pain and sore throat.     Blood pressure 112/77, pulse 80, height 5\' 6"  (1.676 m), weight 164 lb 9.6 oz (74.7 kg), SpO2 100 %.Body mass index is 26.57 kg/m.  General Appearance: Fairly Groomed  Eye Contact:  Good  Speech:  Clear and Coherent and Normal Rate  Volume:  Normal  Mood:  Euthymic  Affect:  Full Range  Thought Process:  Goal Directed and Descriptions of Associations: Intact  Orientation:  Full (Time, Place, and Person)  Thought Content:  Logical  Suicidal Thoughts:  No  Homicidal Thoughts:  No  Memory:  Immediate;   Good  Judgement:  Good  Insight:  Good  Psychomotor Activity:  Normal  Concentration:  Concentration: Good  Recall:  Good  Fund of Knowledge:  Good  Language:  Good  Akathisia:  No  Handed:  Right  AIMS (if indicated):     Assets:  Communication Skills Desire for Improvement Financial Resources/Insurance Housing Intimacy Resilience Social  Support Talents/Skills Transportation Vocational/Educational  ADL's:  Intact  Cognition:  WNL  Sleep:           Screenings: GAD-7     [facility]14/2017 [facility]  Total GAD-7 Score  4  6    PHQ2-9     [facility]  PHQ-2 Total Score  0  1  0  0  0  PHQ-9 Total Score  2  4  -  -  -       Reviewed the information below on 03/14/2018 and have updated it Assessment and Plan: Panic attacks;  ADHD-inattentive type; Tourette's disorder; bulimia nervosa; MDD      Medication management with supportive therapy. Risks and benefits, side effects and alternative treatment options discussed with patient. Pt was given an opportunity to ask questions about medication, illness, and treatment. All current psychiatric medications have been reviewed and discussed with the patient and adjusted as clinically appropriate. The patient has been provided an accurate and updated list of the medications being now prescribed. Patient expressed understanding of how their medications were to be used.  Pt verbalized understanding and verbal consent obtained for treatment.   The risk of un-intended pregnancy is high based on the fact that pt reports she is not using any form of birth control. Pt is aware that these meds carry a teratogenic risk. Pt will discuss plan of action if she does or plans to become pregnant in the future.   Status of current problems: overall better   Meds: Adderall XR 25 mg p.o. every morning for ADHD.  Patient states that this medication also helps to control her Tourette's Adderall 20 mg p.o.  qD for ADHD when in class- she is not filled the second prescription yet because she only takes it a few times a week D/c Propanolol due to SE - Reports that there is a family history of addiction therefore she does not want to take any medication with the abuse potential.  She also does not want to take anything that is sedating.  Labs: none   Therapy: brief supportive therapy provided. Discussed psychosocial stressors in detail.    pt advised to use Calm or Headspace app to practice coping skills for anxiety   Consultations: Encouraged to follow up with therapist-she is concerned due to changing insurance companies in January.  Her current therapist is not covered by the new insurance company and patient is not sure how to handle this Encouraged to follow up with PCP as needed   Pt denies SI and is at an acute low risk for suicide. Patient told to call clinic if any problems occur. Patient advised to go to ER if they should develop SI/HI, side effects, or if symptoms worsen. Has crisis numbers to call if needed. Pt verbalized understanding.   F/up in 3 months or sooner if needed   Oletta Darter, MD 03/14/2018, 2:27 PM

## 2018-03-20 ENCOUNTER — Ambulatory Visit (INDEPENDENT_AMBULATORY_CARE_PROVIDER_SITE_OTHER): Payer: BLUE CROSS/BLUE SHIELD | Admitting: Licensed Clinical Social Worker

## 2018-03-20 DIAGNOSIS — F411 Generalized anxiety disorder: Secondary | ICD-10-CM | POA: Diagnosis not present

## 2018-03-21 ENCOUNTER — Encounter (HOSPITAL_COMMUNITY): Payer: Self-pay | Admitting: Licensed Clinical Social Worker

## 2018-03-21 NOTE — Progress Notes (Signed)
   THERAPIST PROGRESS NOTE  Session Time: 4:30-5:20pm  Participation Level: Active  Behavioral Response: Well GroomedAlertAnxious  Type of Therapy: Individual Therapy  Treatment Goals addressed: Improve psychiatric symptoms, Controlled Behavior, Moderated Mood, Improve Unhelpful Thought Patterns, Emotional Regulation Skills (Moderate moods, anger management, stress management), Feel and express a full Range of Emotions, Learn about Diagnosis, Healthy Coping Skills.  Interventions: CBT  Summary: Cindy Jones is a 27 y.o. female who presents for her counseling session. Pt discussed her psychiatric symptoms and current life events. Pt met with her psychiatrist Dr. Doyne Keel, who discontinued her Propanalol due to side effects and pt reports she is no longer having panic attacks in the mornings before work. Pt reports she and her husband'Jones relationship has improved. She has given up control on a lot of household and financial duties to him. She is moving towards the end of her first semester and exams are upcoming. Pt is overwhelmed but seems to be tolerating it well. Pt has invited her mother to come to the next appointment. Assisted pt compartmentalizing her thoughts for the therapy session with her mother. Pt wants to begin to have a relationship with her mother and begin the conversation at her next session.   Suicidal/Homicidal: Nowithout intent/plan  Therapist Response: Assessed [name] current functioning and reviewed progress. Assisted pt processing current stressors and preparing for a session with her mother to discuss relationship.  Assisted pt processing for the management of her stressors.  Plan: Return again in 2 weeks.  Diagnosis: Axis I:  GAD    Cindy Jones, LCAS 03/21/18

## 2018-03-29 DIAGNOSIS — H6691 Otitis media, unspecified, right ear: Secondary | ICD-10-CM | POA: Diagnosis not present

## 2018-03-29 DIAGNOSIS — J019 Acute sinusitis, unspecified: Secondary | ICD-10-CM | POA: Diagnosis not present

## 2018-03-29 DIAGNOSIS — B9689 Other specified bacterial agents as the cause of diseases classified elsewhere: Secondary | ICD-10-CM | POA: Diagnosis not present

## 2018-04-03 ENCOUNTER — Ambulatory Visit (HOSPITAL_COMMUNITY): Payer: BLUE CROSS/BLUE SHIELD | Admitting: Licensed Clinical Social Worker

## 2018-04-10 ENCOUNTER — Ambulatory Visit (INDEPENDENT_AMBULATORY_CARE_PROVIDER_SITE_OTHER): Payer: BLUE CROSS/BLUE SHIELD | Admitting: Licensed Clinical Social Worker

## 2018-04-10 ENCOUNTER — Encounter (HOSPITAL_COMMUNITY): Payer: Self-pay | Admitting: Licensed Clinical Social Worker

## 2018-04-10 DIAGNOSIS — F411 Generalized anxiety disorder: Secondary | ICD-10-CM | POA: Diagnosis not present

## 2018-04-10 NOTE — Progress Notes (Signed)
   THERAPIST PROGRESS NOTE  Session Time: 4:30-5:20pm  Participation Level: Active  Behavioral Response: Well GroomedAlertAnxious  Type of Therapy: Individual Therapy  Treatment Goals addressed: Improve psychiatric symptoms, Controlled Behavior, Moderated Mood, Improve Unhelpful Thought Patterns, Emotional Regulation Skills (Moderate moods, anger management, stress management), Feel and express a full Range of Emotions, Learn about Diagnosis, Healthy Coping Skills.  Interventions: CBT  Summary: Cindy Jones is a 27 y.o. female who presents for her counseling session. Pt discussed her psychiatric symptoms and current life events. Pt reports her last 2 weeks have been difficult due to her major stressors: work and school. Her semester is over and she has a month break. Her job stress is less because she will be off work due to the holidays. Discussed with pt the importance of self care during the break. Pt reports she has been irritable and she and her husband have been arguing more. Asked open ended questions. Pt reports her panic attacks returned during the last week of school and exams, but have not returned. Pt is hoping to bring her mother to her next appointment to begin a relationship with her mother. Processed this at length with pt.    Suicidal/Homicidal: Nowithout intent/plan  Therapist Response: Assessed pt's current functioning and reviewed progress. Assisted pt processing current stressors and preparing for a session with her mother to discuss relationship, self care, relationship issues, panic attacks.  Assisted pt processing for the management of her stressors.  Plan: Return again in 2 weeks.  Diagnosis: Axis I:  GAD    Amellia Panik S, LCAS 03/21/18

## 2018-04-18 ENCOUNTER — Ambulatory Visit (INDEPENDENT_AMBULATORY_CARE_PROVIDER_SITE_OTHER): Payer: BLUE CROSS/BLUE SHIELD | Admitting: Licensed Clinical Social Worker

## 2018-04-18 ENCOUNTER — Encounter (HOSPITAL_COMMUNITY): Payer: Self-pay | Admitting: Licensed Clinical Social Worker

## 2018-04-18 DIAGNOSIS — F411 Generalized anxiety disorder: Secondary | ICD-10-CM | POA: Diagnosis not present

## 2018-04-18 NOTE — Progress Notes (Signed)
   THERAPIST PROGRESS NOTE  Session Time: 3:30-4:20pm  Participation Level: Active  Behavioral Response: Well GroomedAlertAnxious  Type of Therapy: Family session  Treatment Goals addressed: Improve psychiatric symptoms, Controlled Behavior, Moderated Mood, Improve Unhelpful Thought Patterns, Emotional Regulation Skills (Moderate moods, anger management, stress management), Feel and express a full Range of Emotions, Learn about Diagnosis, Healthy Coping Skills.  Interventions: CBT  Summary: Cindy Jones is a 27 y.o. female who presents for her counseling session. Pt's mother joined the session. The pt has not spoken to her  Mother in more than a year and wants to begin to have a relationship with her mother. Mediated a discussion between mother and daughter through counseling skills where both mother and daughter shared their hurts and desire for a future relationship, with boundaries. Both discussed what they want in a future relationship. Guided both parties into a discussion in a safe environment. Made a plan for future sessions.    Suicidal/Homicidal: Nowithout intent/plan  Therapist Response: Assessed pt's current functioning and reviewed progress. Assisted pt and her mother discussing their relationship.  Plan: Return again in 2 weeks.  Diagnosis: Axis I:  GAD    Maday Guarino S, LCAS 04/18/18

## 2018-05-08 ENCOUNTER — Ambulatory Visit (INDEPENDENT_AMBULATORY_CARE_PROVIDER_SITE_OTHER): Payer: No Typology Code available for payment source | Admitting: Licensed Clinical Social Worker

## 2018-05-08 DIAGNOSIS — F411 Generalized anxiety disorder: Secondary | ICD-10-CM

## 2018-05-09 ENCOUNTER — Encounter (HOSPITAL_COMMUNITY): Payer: Self-pay | Admitting: Licensed Clinical Social Worker

## 2018-05-09 NOTE — Progress Notes (Signed)
   THERAPIST PROGRESS NOTE  Session Time: 5:40-6:30pm  Participation Level: Active  Behavioral Response: Well GroomedAlertAnxious  Type of Therapy: Family session  Treatment Goals addressed: Improve psychiatric symptoms, Controlled Behavior, Moderated Mood, Improve Unhelpful Thought Patterns, Emotional Regulation Skills (Moderate moods, anger management, stress management), Feel and express a full Range of Emotions, Learn about Diagnosis, Healthy Coping Skills.  Interventions: CBT  Summary: Cindy Jones is a 28 y.o. female who presents for her counseling session. Pt discussed her psychiatric symptoms and current life events. Pt presents anxious today. She discussed her holidays with family. It was low key, her husband had to work Writer day. Discussed her feelings, validated her feelings. Pt is anxiously awaiting starting school again next week. Discussed her panic attacks and coping skills for the attacks. Pt is taking 1 course this semester so feels assured she will be more prepared to handle the stress this semester. Discussed with pt her last session with her mother and plans to move forward with session with her mother (goals). Pt reports she is applying for a promotion at work. Discussed anticipated stress and coping skills.   Suicidal/Homicidal: Nowithout intent/plan  Therapist Response: Assessed pt's current functioning and reviewed progress. Assisted pt processing holidays, family chaos, last session with mother, upcoming stressors. Assisted pt processing for the management of her stressors.  Plan: Return again in 2 weeks.  Diagnosis: Axis I:  GAD    [name],[name], LCAS 05/08/2018

## 2018-05-14 ENCOUNTER — Ambulatory Visit (INDEPENDENT_AMBULATORY_CARE_PROVIDER_SITE_OTHER): Payer: No Typology Code available for payment source | Admitting: Licensed Clinical Social Worker

## 2018-05-14 DIAGNOSIS — F411 Generalized anxiety disorder: Secondary | ICD-10-CM | POA: Diagnosis not present

## 2018-05-15 ENCOUNTER — Encounter (HOSPITAL_COMMUNITY): Payer: Self-pay | Admitting: Licensed Clinical Social Worker

## 2018-05-15 NOTE — Progress Notes (Signed)
   THERAPIST PROGRESS NOTE  Session Time: 4:10-5:00pm  Participation Level: Active  Behavioral Response: Well GroomedAlertAnxious  Type of Therapy: Family session  Treatment Goals addressed: Improve psychiatric symptoms, Controlled Behavior, Moderated Mood, Improve Unhelpful Thought Patterns, Emotional Regulation Skills (Moderate moods, anger management, stress management), Feel and express a full Range of Emotions, Learn about Diagnosis, Healthy Coping Skills.  Interventions: CBT  Summary: Cindy Jones is a 28 y.o. female who presents for her counseling session. Pt discussed her psychiatric symptoms and current life events. Pt presents anxious today. Pt was tearful in session as she described the adjustment she and her husband are going through. Discussed "Is life fair?" Role played effective communication styles with her husband. Pt had interview for promotion at work today. Asked open ended questions and discussed possibility of new stressors at work combined with continues school stress - outcome?     Suicidal/Homicidal: Nowithout intent/plan  Therapist Response: Assessed pt's current functioning and reviewed progress. Assisted pt processing adjustment period in marriage, effective communication skills, interview for promotion today, possibility of new stressors at work, upcoming school stressors. Assisted pt processing for the management of her stressors.  Plan: Return again in 2 weeks.  Diagnosis: Axis I:  GAD    Buford Gayler S, LCAS 05/15/2018

## 2018-05-21 ENCOUNTER — Encounter (HOSPITAL_COMMUNITY): Payer: Self-pay | Admitting: Licensed Clinical Social Worker

## 2018-05-21 ENCOUNTER — Ambulatory Visit (INDEPENDENT_AMBULATORY_CARE_PROVIDER_SITE_OTHER): Payer: No Typology Code available for payment source | Admitting: Licensed Clinical Social Worker

## 2018-05-21 DIAGNOSIS — F411 Generalized anxiety disorder: Secondary | ICD-10-CM

## 2018-05-21 NOTE — Progress Notes (Signed)
   THERAPIST PROGRESS NOTE  Session Time: 4:10-5:00pm  Participation Level: Active  Behavioral Response: Well GroomedAlertAnxious  Type of Therapy: Family session  Treatment Goals addressed: Improve psychiatric symptoms, Controlled Behavior, Moderated Mood, Improve Unhelpful Thought Patterns, Emotional Regulation Skills (Moderate moods, anger management, stress management), Feel and express a full Range of Emotions, Learn about Diagnosis, Healthy Coping Skills.  Interventions: CBT  Summary: Cindy Jones is a 28 y.o. female who presents for her counseling session. Pt discussed her psychiatric symptoms and current life events. Pt presents anxious today. Pt was tearful in session as she  Shared she did not get the job she applied for. Asked open ended questions and empathic reflection. Discussed the cognitive model with pt and practiced rational thoughts from negative thoughts and developing into new emotions and behaviors. Pt continues to struggle with communication with husband. Role played effective communication skills, including her voice. Discussed with pt the goals she wants to set for her next appointment that will include her mother.   Suicidal/Homicidal: Nowithout intent/plan  Therapist Response: Assessed pt's current functioning and reviewed progress. Assisted pt processing disappointment, cognitive model, negative thoughts vs rational thoughts. djustment period in marriage, effective communication skills, goals for next appointment with mother. Assisted pt processing for the management of her stressors.  Plan: Return again in 2 weeks.  Diagnosis: Axis I:  GAD    [name],[name], LCAS 05/15/2018

## 2018-06-03 ENCOUNTER — Ambulatory Visit (INDEPENDENT_AMBULATORY_CARE_PROVIDER_SITE_OTHER): Payer: No Typology Code available for payment source | Admitting: Family Medicine

## 2018-06-03 ENCOUNTER — Encounter: Payer: Self-pay | Admitting: Family Medicine

## 2018-06-03 VITALS — BP 129/80 | HR 72 | Temp 97.4°F | Resp 16 | Ht 65.5 in | Wt 178.0 lb

## 2018-06-03 DIAGNOSIS — Z130 Encounter for screening for diseases of the blood and blood-forming organs and certain disorders involving the immune mechanism: Secondary | ICD-10-CM

## 2018-06-03 DIAGNOSIS — E663 Overweight: Secondary | ICD-10-CM | POA: Insufficient documentation

## 2018-06-03 DIAGNOSIS — Z79899 Other long term (current) drug therapy: Secondary | ICD-10-CM | POA: Diagnosis not present

## 2018-06-03 DIAGNOSIS — Z0000 Encounter for general adult medical examination without abnormal findings: Secondary | ICD-10-CM

## 2018-06-03 DIAGNOSIS — Z131 Encounter for screening for diabetes mellitus: Secondary | ICD-10-CM

## 2018-06-03 LAB — LIPID PANEL
Cholesterol: 133 mg/dL (ref 0–200)
HDL: 53.7 mg/dL (ref >39.00)
LDL Cholesterol: 69 mg/dL (ref 0–99)
NonHDL: 78.95
Total CHOL/HDL Ratio: 2
Triglycerides: 52 mg/dL (ref 0.0–149.0)
VLDL: 10.4 mg/dL (ref 0.0–40.0)

## 2018-06-03 LAB — COMPREHENSIVE METABOLIC PANEL
ALT: 18 U/L (ref 0–35)
AST: 16 U/L (ref 0–37)
Albumin: 4.5 g/dL (ref 3.5–5.2)
Alkaline Phosphatase: 39 U/L (ref 39–117)
BUN: 19 mg/dL (ref 6–23)
CO2: 27 mEq/L (ref 19–32)
Calcium: 9 mg/dL (ref 8.4–10.5)
Chloride: 101 mEq/L (ref 96–112)
Creatinine, Ser: 0.7 mg/dL (ref 0.40–1.20)
GFR: 99.98 mL/min (ref >60.00)
Glucose, Bld: 85 mg/dL (ref 70–99)
Potassium: 4.7 mEq/L (ref 3.5–5.1)
Sodium: 134 mEq/L — ABNORMAL LOW (ref 135–145)
Total Bilirubin: 0.4 mg/dL (ref 0.2–1.2)
Total Protein: 6.8 g/dL (ref 6.0–8.3)

## 2018-06-03 LAB — CBC WITH DIFFERENTIAL/PLATELET
Basophils Absolute: 0.1 10*3/uL (ref 0.0–0.1)
Basophils Relative: 1.8 % (ref 0.0–3.0)
Eosinophils Absolute: 0.5 10*3/uL (ref 0.0–0.7)
Eosinophils Relative: 8 % — ABNORMAL HIGH (ref 0.0–5.0)
HCT: 40.4 % (ref 36.0–46.0)
Hemoglobin: 13.6 g/dL (ref 12.0–15.0)
Lymphocytes Relative: 24.5 % (ref 12.0–46.0)
Lymphs Abs: 1.5 10*3/uL (ref 0.7–4.0)
MCHC: 33.8 g/dL (ref 30.0–36.0)
MCV: 91.3 fl (ref 78.0–100.0)
Monocytes Absolute: 0.6 10*3/uL (ref 0.1–1.0)
Monocytes Relative: 9.7 % (ref 3.0–12.0)
Neutro Abs: 3.4 10*3/uL (ref 1.4–7.7)
Neutrophils Relative %: 56 % (ref 43.0–77.0)
Platelets: 206 10*3/uL (ref 150.0–400.0)
RBC: 4.42 Mil/uL (ref 3.87–5.11)
RDW: 14 % (ref 11.5–15.5)
WBC: 6.1 10*3/uL (ref 4.0–10.5)

## 2018-06-03 LAB — HEMOGLOBIN A1C: Hgb A1c MFr Bld: 5.2 % (ref 4.6–6.5)

## 2018-06-03 MED ORDER — TRIAMCINOLONE ACETONIDE 0.1 % EX CREA: 1.0000 "application " | TOPICAL_CREAM | Freq: Two times a day (BID) | CUTANEOUS | 5 refills | Status: DC

## 2018-06-03 NOTE — Progress Notes (Signed)
Patient ID: Cindy Jones, female  DOB: 03/27/1991, 28 y.o.   MRN: [id number] Patient Care Team    Relationship Specialty Notifications Start End  Ma Hillock, DO PCP - General Family Medicine  07/14/15   Michael Boston, MD Consulting Physician General Surgery  01/31/16   Armbruster, Carlota Raspberry, MD Consulting Physician Gastroenterology  01/31/16     Chief Complaint  Patient presents with  . Annual Exam    Pt is fasting.    Subjective:  Cindy Jones is a 28 y.o.  Female  present for CPE. All past medical history, surgical history, allergies, family history, immunizations, medications and social history were updated in the electronic medical record today. All recent labs, ED visits and hospitalizations within the last year were reviewed.  Health maintenance: updated 06/03/18 Colonoscopy: N/A, EGD completed 12/2015 Dr. Havery Moros Mammogram: No Fhx screen at 40 Cervical cancer screening: last pap: 12/11/2016 normal/- neg HPV; Ferdie Bakken. Dr. Hulan Fray has also performed. Patient's last menstrual period was 05/21/2018. Immunizations: tdap UTD 2017, Influenza 2019 UTD (encouraged yearly) Infectious disease screening: HIV completed 2015 DEXA: N/A Assistive device: none Oxygen OEC:XFQH Patient has a Dental home. Hospitalizations/ED visits: reviewed  Depression screen [facility] 2/9 06/03/2018 11/05/2017 02/14/2017 07/14/2016 07/04/2015  Decreased Interest 0 0 0 0 0  Down, Depressed, Hopeless 0 0 1 0 0  PHQ - 2 Score 0 0 1 0 0  Altered sleeping 1 0 0 - -  Tired, decreased energy 1 1 2  - -  Change in appetite 0 0 0 - -  Feeling bad or failure about yourself  1 1 0 - -  Trouble concentrating 1 0 1 - -  Moving slowly or fidgety/restless 0 0 0 - -  Suicidal thoughts 0 0 0 - -  PHQ-9 Score 4 2 4  - -  Difficult doing work/chores Not difficult at all - - - -   GAD 7 : Generalized Anxiety Score 06/03/2018 11/05/2017 02/14/2017  Nervous, Anxious, on Edge 1 1 2   Control/stop worrying 1 0 1  Worry too much -  different things 1 1 1   Trouble relaxing 1 1 1   Restless 0 0 0  Easily annoyed or irritable 1 1 1   Afraid - awful might happen 0 0 0  Total GAD 7 Score 5 4 6   Anxiety Difficulty Not difficult at all - -    Immunization History  Administered Date(s) Administered  . Influenza Inj Mdck Quad With Preservative 03/31/2017, 02/08/2018  . Influenza,inj,Quad PF,6+ Mos 06/08/2014  . Influenza-Unspecified 02/24/2015, 02/05/2018  . Td 07/14/2015  . Tdap 05/01/2005     Past Medical History:  Diagnosis Date  . Anxiety   . Asthma    as a child  . Binge eating disorder   . Dysrhythmia    " I feel it about once a month"  . Frequent headaches   . Migraines   . PID (acute pelvic inflammatory disease)   . Seasonal allergies   . Tourette disorder   . Urinary tract bacterial infections    Allergies  Allergen Reactions  . Latex Swelling and Rash  . Cranberry Juice Powder Other (See Comments)    Throat pain.  . Naproxen Other (See Comments)    "Liver starts hurting" Diarrhea  . Nickel     Skin peeling  . Sulfa Antibiotics Hives and Swelling   Past Surgical History:  Procedure Laterality Date  . CHOLECYSTECTOMY    . ESOPHAGOGASTRODUODENOSCOPY    . LAPAROSCOPIC CHOLECYSTECTOMY  SINGLE SITE WITH INTRAOPERATIVE CHOLANGIOGRAM N/A 03/15/2016   Procedure: LAPAROSCOPIC CHOLECYSTECTOMY SINGLE SITE WITH INTRAOPERATIVE CHOLANGIOGRAM;  Surgeon: Michael Boston, MD;  Location: [facility];  Service: General;  Laterality: N/A;  . TONSILLECTOMY AND ADENOIDECTOMY  2013  . WISDOM TOOTH EXTRACTION     Family History  Problem Relation Age of Onset  . Alcohol abuse Mother   . Bipolar disorder Mother   . Cancer Father        sarcoma; passed away when pt was 8  . Tourette syndrome Maternal Aunt   . Alcohol abuse Maternal Aunt   . Hyperlipidemia Paternal Aunt   . Tourette syndrome Maternal Grandfather   . Heart disease Paternal Grandfather   . Alcohol abuse Maternal Uncle   . Suicidality Neg Hx    Social  History   Social History Narrative   - Married, no children.   - Financial risk analyst.   - She works FT as a Physiological scientist.   - Her aunt & uncle were her guardians when she was in [place]. They live in [place], [place].    - She has a fraternal twin sister.    - Wears her seatbelt, smoke detectors in the home.    Allergies as of 06/03/2018      Reactions   Latex Swelling, Rash   Cranberry Juice Powder Other (See Comments)   Throat pain.   Naproxen Other (See Comments)   "Liver starts hurting" Diarrhea   Nickel    Skin peeling   Sulfa Antibiotics Hives, Swelling      Medication List       Accurate as of June 03, 2018  8:29 AM. Always use your most recent med list.        amphetamine-dextroamphetamine 20 MG tablet Commonly known as:  ADDERALL Take 1 tablet (20 mg total) by mouth daily after lunch.   amphetamine-dextroamphetamine 20 MG tablet Commonly known as:  ADDERALL Take 1 tablet (20 mg total) by mouth daily with lunch.   amphetamine-dextroamphetamine 25 MG 24 hr capsule Commonly known as:  ADDERALL XR Take 1 capsule by mouth every morning.   amphetamine-dextroamphetamine 25 MG 24 hr capsule Commonly known as:  ADDERALL XR Take 1 capsule by mouth every morning.   amphetamine-dextroamphetamine 25 MG 24 hr capsule Commonly known as:  ADDERALL XR Take 1 capsule by mouth daily.   aspirin-acetaminophen-caffeine 250-250-65 MG tablet Commonly known as:  EXCEDRIN MIGRAINE Take 1-2 tablets by mouth every 6 (six) hours as needed for headache.   Fish Oil 1000 MG Caps Take 1 capsule by mouth daily.   MELATONIN ER PO Take by mouth.   triamcinolone cream 0.1 % Commonly known as:  KENALOG Apply 1 application topically 2 (two) times daily.       All past medical history, surgical history, allergies, family history, immunizations andmedications were updated in the EMR today and reviewed under the history and medication portions of their EMR.     No results found for  this or any previous visit (from the past 2160 hour(s)).  No results found.   ROS: 14 pt review of systems performed and negative (unless mentioned in an HPI)  Objective: BP 129/80 (BP Location: Left Arm, Patient Position: Sitting, Cuff Size: Normal)   Pulse 72   Temp (!) 97.4 F (36.3 C) (Oral)   Resp 16   Ht 5' 5.5" (1.664 m)   Wt 178 lb (80.7 kg)   LMP 05/21/2018   SpO2 100%   BMI 29.17 kg/m  Gen: Afebrile. No acute distress. Nontoxic in appearance, well-developed, well-nourished,  Overweight. Pleasant female.  HENT: AT. [place]. Bilateral TM visualized and normal in appearance, normal external auditory canal. MMM, no oral lesions, adequate dentition. Bilateral nares within normal limits. Throat withoutno erythema, ulcerations or exudates. no Cough on exam, no hoarseness on exam. Eyes:Pupils Equal Round Reactive to light, Extraocular movements intact,  Conjunctiva without redness, discharge or icterus. Neck/lymp/endocrine: Supple,no lymphadenopathy, no thyromegaly CV: RRR no murmur, no edema, +2/4 P posterior tibialis pulses. no carotid bruits. No JVD. Chest: CTAB, no wheeze, rhonchi or crackles. Normal Respiratory effort. good Air movement. Abd: Soft. Flat, NTND. BS present. no Masses palpated. No hepatosplenomegaly. No rebound tenderness or guarding. Skin: no rashes, purpura or petechiae. Warm and well-perfused. Skin intact. Neuro/Msk: Normal gait. PERLA. EOMi. Alert. Oriented x3.  Cranial nerves II through XII intact. Muscle strength 5/5 upper/lower extremity. DTRs equal bilaterally. Psych: Normal affect, dress and demeanor. Normal speech. Normal thought content and judgment.   No exam data present  Assessment/plan: Cindy Jones is a 28 y.o. female present for CPE.  Overweight (BMI 25.0-29.9) - diet and exercise. She is doing keto diet.  - She has a h/o bulmia.  - Lipid Profile Encounter for long-term current use of medication - CBC w/Diff - Comp Met (CMET) Screening for  deficiency anemia - CBC w/Diff Screening for diabetes mellitus - HgB A1c Encounter for preventive health examination Patient was encouraged to exercise greater than 150 minutes a week. Patient was encouraged to choose a diet filled with fresh fruits and vegetables, and lean meats. AVS provided to patient today for education/recommendation on gender specific health and safety maintenance. Colonoscopy: N/A, EGD completed 12/2015 Dr. Havery Moros Mammogram: No Fhx screen at 40 Cervical cancer screening: last pap: 12/11/2016 normal/- neg HPV; Tabbetha Kutscher. Dr. Hulan Fray has also performed. Patient's last menstrual period was 05/21/2018. Immunizations: tdap UTD 2017, Influenza 2019 UTD (encouraged yearly) Infectious disease screening: HIV completed 2015 DEXA: N/A  No follow-ups on file.  Electronically signed by: Howard Pouch, DO [place]

## 2018-06-03 NOTE — Patient Instructions (Signed)

## 2018-06-06 ENCOUNTER — Ambulatory Visit (INDEPENDENT_AMBULATORY_CARE_PROVIDER_SITE_OTHER): Payer: No Typology Code available for payment source | Admitting: Licensed Clinical Social Worker

## 2018-06-06 ENCOUNTER — Encounter (HOSPITAL_COMMUNITY): Payer: Self-pay | Admitting: Licensed Clinical Social Worker

## 2018-06-06 ENCOUNTER — Telehealth (HOSPITAL_COMMUNITY): Payer: Self-pay

## 2018-06-06 DIAGNOSIS — F988 Other specified behavioral and emotional disorders with onset usually occurring in childhood and adolescence: Secondary | ICD-10-CM

## 2018-06-06 DIAGNOSIS — F411 Generalized anxiety disorder: Secondary | ICD-10-CM

## 2018-06-06 MED ORDER — AMPHETAMINE-DEXTROAMPHET ER 25 MG PO CP24: 25.0000 mg | ORAL_CAPSULE | Freq: Every day | ORAL | 0 refills | Status: DC

## 2018-06-06 MED FILL — ADDERALL XR 25 MG CAPSULE: 25 | 30 days supply | Qty: 30 | Fill #0

## 2018-06-06 NOTE — Progress Notes (Signed)
   THERAPIST PROGRESS NOTE  Session Time: 3:40-4:30pm  Participation Level: Active  Behavioral Response: Well GroomedAlertAnxious  Type of Therapy: Family session  Treatment Goals addressed: Improve psychiatric symptoms, Controlled Behavior, Moderated Mood, Improve Unhelpful Thought Patterns, Emotional Regulation Skills (Moderate moods, anger management, stress management), Feel and express a full Range of Emotions, Learn about Diagnosis, Healthy Coping Skills.  Interventions: CBT  Summary: Cindy Jones is a 28 y.o. female who presents for her counseling session. Pt discussed her psychiatric symptoms and current life events. Pt presents anxious today. She is going to bring her mother to her next appt to continue working with building their relationship. Reviewed expectations of the appointment. Pt is struggling with the relationship with her sister. Asked open ended questions and used empathic reflection. Gave pt handouts of ACOA and dysfunctional families. Discussed the relationship between them and problem solved strategies to have a relationship using boundaries. Pt had questions about her meds and asked CMA Regan to speak with pt.   Suicidal/Homicidal: Nowithout intent/plan  Therapist Response: Assessed pt's current functioning and reviewed progress. Assisted pt processing upcoming appt with mother, relationship with sister, strategies to have a healthy relationship with her sister, ACOA, boundaries. Assisted pt processing for the management of her stressors.  Plan: Return again in 2 weeks.  Diagnosis: Axis I:  GAD    [name],[name], LCAS 06/06/2018

## 2018-06-06 NOTE — Telephone Encounter (Signed)
This is a patient of Dr. 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 When she went to her pharmacy to pick up her last prescription of Adderall, she was told that the insurance would not let them dispense it and she had to go to [facility]. Can you send in a 30 day supply to [facility] outpatient please? Thank you

## 2018-06-06 NOTE — Telephone Encounter (Signed)
Prescription sent to [facility].

## 2018-06-13 ENCOUNTER — Ambulatory Visit (INDEPENDENT_AMBULATORY_CARE_PROVIDER_SITE_OTHER): Payer: No Typology Code available for payment source | Admitting: Psychiatry

## 2018-06-13 ENCOUNTER — Encounter (HOSPITAL_COMMUNITY): Payer: Self-pay | Admitting: Psychiatry

## 2018-06-13 VITALS — BP 115/76 | HR 78 | Ht 65.5 in | Wt 170.0 lb

## 2018-06-13 DIAGNOSIS — F952 Tourette's disorder: Secondary | ICD-10-CM

## 2018-06-13 DIAGNOSIS — F502 Bulimia nervosa: Secondary | ICD-10-CM | POA: Diagnosis not present

## 2018-06-13 DIAGNOSIS — F410 Panic disorder [episodic paroxysmal anxiety] without agoraphobia: Secondary | ICD-10-CM | POA: Diagnosis not present

## 2018-06-13 DIAGNOSIS — F988 Other specified behavioral and emotional disorders with onset usually occurring in childhood and adolescence: Secondary | ICD-10-CM

## 2018-06-13 MED ORDER — AMPHETAMINE-DEXTROAMPHET ER 25 MG PO CP24: 25.0000 mg | ORAL_CAPSULE | ORAL | 0 refills | Status: DC

## 2018-06-13 MED ORDER — AMPHETAMINE-DEXTROAMPHETAMINE 20 MG PO TABS: 20.0000 mg | ORAL_TABLET | Freq: Every day | ORAL | 0 refills | Status: DC | PRN

## 2018-06-13 MED ORDER — AMPHETAMINE-DEXTROAMPHET ER 25 MG PO CP24: 25.0000 mg | ORAL_CAPSULE | Freq: Every day | ORAL | 0 refills | Status: DC

## 2018-06-13 MED FILL — DEXTROAMP-AMPHETAMIN 20 MG: 20 | 30 days supply | Qty: 30 | Fill #0

## 2018-06-13 NOTE — Progress Notes (Signed)
BH MD/PA/NP OP Progress Note  06/13/2018 10:22 AM Cindy Jones  MRN:  [id number]  Chief Complaint:  Chief Complaint    Follow-up; Medication Refill     HPI: Cindy Jones tells me that she is doing well.  She finished her last semester with a 4.0.  This semester she is only taking 1 class.  It is stressful since she is also working full-time but more manageable than before.  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 it will be a way to challenge herself and to improve her resume.  Overall her anxiety is manageable.  She did have a panic attack yesterday due to high stress but states that she had not had one prior for several months.  He is denying any symptoms of depression.  She denies SI/HI.  She has been taking her Adderall XR and denies any side effects.  She only takes Adderall 20 mg when she has class.  She does note that when she takes the 20 mg dose later in the day she does not sleep as well.  Overall though her sleep is usually pretty good.  She tells me that she overindulged over Thanksgiving and Christmas.  She is now trying to get her diet back on track.  She is exploring the idea of intermittent fasting but assures me that she is eating 3 meals a day.  She denies restricting or purging or binging.  Visit Diagnosis:    ICD-10-CM   1. Panic attacks F41.0   2. ADD (attention deficit disorder) without hyperactivity F98.8 amphetamine-dextroamphetamine (ADDERALL XR) 25 MG 24 hr capsule    amphetamine-dextroamphetamine (ADDERALL XR) 25 MG 24 hr capsule    amphetamine-dextroamphetamine (ADDERALL XR) 25 MG 24 hr capsule    amphetamine-dextroamphetamine (ADDERALL) 20 MG tablet    amphetamine-dextroamphetamine (ADDERALL) 20 MG tablet  3. Tourette syndrome F95.2   4. Bulimia nervosa F50.2       Past Psychiatric History: Anxiety: Yes Bipolar Disorder: No Depression: Yes Mania: No Psychosis: No Schizophrenia: No Personality Disorder: No Hospitalization for psychiatric  illness: No History of Electroconvulsive Shock Therapy: No Prior Suicide Attempts: No   Past Medical History:  Past Medical History:  Diagnosis Date  . Anxiety   . Asthma    as a child  . Binge eating disorder   . Dysrhythmia    " I feel it about once a month"  . Frequent headaches   . GAD (generalized anxiety disorder)   . GERD (gastroesophageal reflux disease)   . Major depression in complete remission (HCC)   . Migraines   . PID (acute pelvic inflammatory disease)   . Seasonal allergies   . Tourette disorder   . Urinary tract bacterial infections     Past Surgical History:  Procedure Laterality Date  . CHOLECYSTECTOMY    . ESOPHAGOGASTRODUODENOSCOPY    . LAPAROSCOPIC CHOLECYSTECTOMY SINGLE SITE WITH INTRAOPERATIVE CHOLANGIOGRAM N/A 03/15/2016   Procedure: LAPAROSCOPIC CHOLECYSTECTOMY SINGLE SITE WITH INTRAOPERATIVE CHOLANGIOGRAM;  Surgeon: Karie Soda, MD;  Location: [facility] OR;  Service: General;  Laterality: N/A;  . TONSILLECTOMY AND ADENOIDECTOMY  2013  . WISDOM TOOTH EXTRACTION      Family Psychiatric History: Family History  Problem Relation Age of Onset  . Alcohol abuse Mother   . Bipolar disorder Mother   . Cancer Father        sarcoma; passed away when pt was 8  . Tourette syndrome Maternal Aunt   . Alcohol abuse Maternal Aunt   .  Hyperlipidemia Paternal Aunt   . Tourette syndrome Maternal Grandfather   . Heart disease Paternal Grandfather   . Alcohol abuse Maternal Uncle   . Suicidality Neg Hx     Social History:  Social History   Socioeconomic History  . Marital status: Married    Spouse name: Not on file  . Number of children: 0  . Years of education: Not on file  . Highest education level: Not on file  Occupational History  . Occupation: Systems developeranalyst  Social Needs  . Financial resource strain: Not on file  . Food insecurity:    Worry: Not on file    Inability: Not on file  . Transportation needs:    Medical: Not on file    Non-medical: Not on  file  Tobacco Use  . Smoking status: Never Smoker  . Smokeless tobacco: Never Used  Substance and Sexual Activity  . Alcohol use: Yes    Alcohol/week: 1.0 standard drinks    Types: 1 Glasses of wine per week    Comment: occ  . Drug use: No  . Sexual activity: Yes    Partners: Male    Birth control/protection: None, Condom    Comment: Married  Lifestyle  . Physical activity:    Days per week: Not on file    Minutes per session: Not on file  . Stress: Not on file  Relationships  . Social connections:    Talks on phone: Not on file    Gets together: Not on file    Attends religious service: Not on file    Active member of club or organization: Not on file    Attends meetings of clubs or organizations: Not on file    Relationship status: Not on file  Other Topics Concern  . Not on file  Social History Narrative   - Married, no children.   Psychiatrist- College education.   - She works FT as a Patent attorneyloan broker.   - Her aunt & uncle were her guardians when she was in HS. They live in [place], [place].    - She has a fraternal twin sister.    - Wears her seatbelt, smoke detectors in the home.    Allergies:  Allergies  Allergen Reactions  . Latex Swelling and Rash  . Cranberry Juice Powder Other (See Comments)    Throat pain.  . Naproxen Other (See Comments)    "Liver starts hurting" Diarrhea  . Nickel     Skin peeling  . Sulfa Antibiotics Hives and Swelling    Metabolic Disorder Labs: Lab Results  Component Value Date   HGBA1C 5.2 06/03/2018   MPG 91 07/14/2016   No results found for: PROLACTIN Lab Results  Component Value Date   CHOL 133 06/03/2018   TRIG 52.0 06/03/2018   HDL 53.70 06/03/2018   CHOLHDL 2 06/03/2018   VLDL 10.4 06/03/2018   LDLCALC 69 06/03/2018   Lab Results  Component Value Date   TSH 1.44 07/14/2016    Therapeutic Level Labs: No results found for: LITHIUM No results found for: VALPROATE No components found for:  CBMZ  Current  Medications: Current Outpatient Medications  Medication Sig Dispense Refill  . amphetamine-dextroamphetamine (ADDERALL XR) 25 MG 24 hr capsule Take 1 capsule by mouth daily. 30 capsule 0  . aspirin-acetaminophen-caffeine (EXCEDRIN MIGRAINE) 250-250-65 MG per tablet Take 1-2 tablets by mouth every 6 (six) hours as needed for headache.     [name]. MELATONIN ER PO Take by mouth.    .[name]  Omega-3 Fatty Acids (FISH OIL) 1000 MG CAPS Take 1 capsule by mouth daily.    [name] triamcinolone cream (KENALOG) 0.1 % Apply 1 application topically 2 (two) times daily. 30 g 5  . amphetamine-dextroamphetamine (ADDERALL XR) 25 MG 24 hr capsule Take 1 capsule by mouth every morning. 30 capsule 0  . amphetamine-dextroamphetamine (ADDERALL XR) 25 MG 24 hr capsule Take 1 capsule by mouth every morning. 30 capsule 0  . amphetamine-dextroamphetamine (ADDERALL) 20 MG tablet Take 1 tablet (20 mg total) by mouth daily as needed. 30 tablet 0  . amphetamine-dextroamphetamine (ADDERALL) 20 MG tablet Take 1 tablet (20 mg total) by mouth daily as needed for up to 30 days. 30 tablet 0   No current facility-administered medications for this visit.      Musculoskeletal: Strength & Muscle Tone: within normal limits Gait & Station: normal Patient leans: none   Psychiatric Specialty Exam: Review of Systems  Constitutional: Negative for fever and malaise/fatigue.  Musculoskeletal: Negative for back pain, joint pain, myalgias and neck pain.    Blood pressure 115/76, pulse 78, height 5' 5.5" (1.664 m), weight 170 lb (77.1 kg), last menstrual period 05/21/2018, SpO2 100 %.Body mass index is 27.86 kg/m.  General Appearance: Fairly Groomed  Eye Contact:  Good  Speech:  Clear and Coherent and Pressured as per her usual  Volume:  Normal  Mood:  Euthymic  Affect:  Full Range  Thought Process:  Goal Directed and Descriptions of Associations: Intact  Orientation:  Full (Time, Place, and Person)  Thought Content:  Logical  Suicidal Thoughts:   No  Homicidal Thoughts:  No  Memory:  Immediate;   Good  Judgement:  Good  Insight:  Good  Psychomotor Activity:  Normal  Concentration:  Concentration: Good  Recall:  Good  Fund of Knowledge:  Good  Language:  Good  Akathisia:  No  Handed:  Right  AIMS (if indicated):     Assets:  Communication Skills Desire for Improvement Financial Resources/Insurance Housing Intimacy Leisure Time Physical Health Resilience Social Support Talents/Skills Transportation Vocational/Educational  ADL's:  Intact  Cognition:  WNL  Sleep:   fair          Screenings: GAD-7     Office Visit from 06/03/2018 in [facility] Visit from 11/05/2017 in Center for [facility] Office Visit from 02/14/2017 in Center for [facility]  Total GAD-7 Score  5  4  6     PHQ2-9     Office Visit from 06/03/2018 in [facility] Office Visit from 11/05/2017 in Center for [facility] Office Visit from 02/14/2017 in Center for [facility] Office Visit from 07/14/2016 in [facility] Visit from 07/04/2015 in Primary Care at [facility] Total Score  0  0  1  0  0  PHQ-9 Total Score  4  2  4   -  -       I reviewed the information below on 06/13/2018 and have updated it Assessment and Plan: Panic attacks;  ADHD-inattentive type; Tourette's disorder; bulimia nervosa; MDD- in remission      Medication management with supportive therapy. Risks and benefits, side effects and alternative treatment options discussed with patient. Pt was given an opportunity to ask questions about medication, illness, and treatment. All current psychiatric medications have been reviewed and discussed with the patient and adjusted as clinically appropriate. The patient has been provided an accurate and updated list of  the medications being now prescribed. Patient expressed understanding of how their medications were to  be used.  Pt verbalized understanding and verbal consent obtained for treatment.   The risk of un-intended pregnancy is high based on the fact that pt reports she is not using any form of birth control. Pt is aware that these meds carry a teratogenic risk. Pt will discuss plan of action if she does or plans to become pregnant in the future.   Status of current problems: stable   Meds: Adderall XR 25 mg p.o. every morning for ADHD.  Patient states that this medication also helps to control her Tourette's Adderall 20 mg p.o. qD for ADHD when in class- she is not filled the second prescription yet because she only takes it a few times a week - Reports that there is a family history of addiction therefore she does not want to take any medication with the abuse potential.  She also does not want to take anything that is sedating.  Labs:  I reviewed the labs done on 06/03/2018- sodium is 134; lipid profile and CBC are within normal limits; hemoglobin A1c is 5.2   Therapy: brief supportive therapy provided. Discussed psychosocial stressors in detail.    pt advised to use Calm or Headspace app to practice coping skills for anxiety   Consultations: Encouraged to follow up with therapist. Encouraged to follow up with PCP as needed   Pt denies SI and is at an acute low risk for suicide. Patient told to call clinic if any problems occur. Patient advised to go to ER if they should develop SI/HI, side effects, or if symptoms worsen. Has crisis numbers to call if needed. Pt verbalized understanding.   F/up in 3 months or sooner if needed   Oletta DarterSalina Cloee Dunwoody, MD 06/13/2018, 10:22 AM

## 2018-06-20 ENCOUNTER — Ambulatory Visit (HOSPITAL_COMMUNITY): Payer: No Typology Code available for payment source | Admitting: Licensed Clinical Social Worker

## 2018-07-03 IMAGING — MR MR ABDOMEN WO/W CM
10 of 18 series · 22 of 48 positions shown · IV contrast (multihance)
Comparison: Multiple exams, including 12/31/2013 and CT scan from
11/24/2013

CLINICAL DATA: Splenic lesion follow up

EXAM:
MRI ABDOMEN WITHOUT AND WITH CONTRAST
TECHNIQUE: Multiplanar multisequence MR imaging of the abdomen was performed
both before and after the administration of intravenous contrast.
CONTRAST:  15mL MULTIHANCE GADOBENATE DIMEGLUMINE 529 MG/ML IV SOLN

[Series 4: T2 fat-sat · axial · 5.0mm · 0.78mm/px · z∈[-85,+155]mm · 2 of 49 slices shown]
[im 1/49]
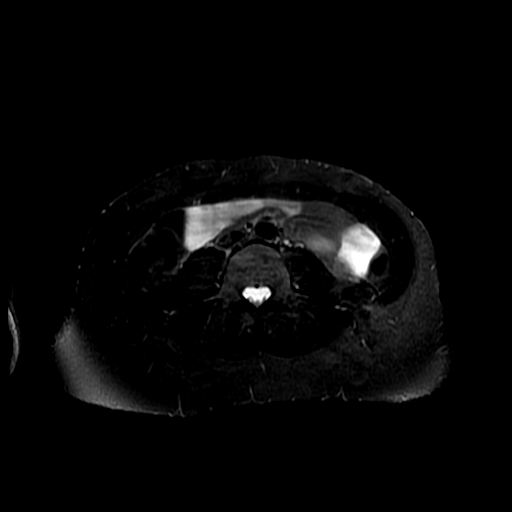
[im 49/49]
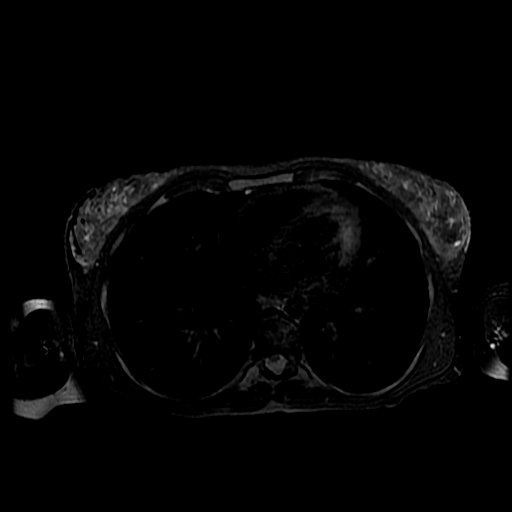

[Series 5: DWI b500 · axial · 5.0mm · 1.56mm/px · z∈[-85,+155]mm · 3 of 98 slices shown]
[im 1/98]
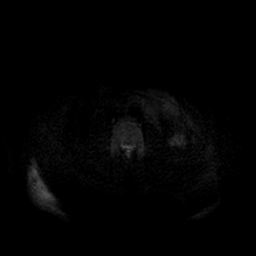
[im 49/98]
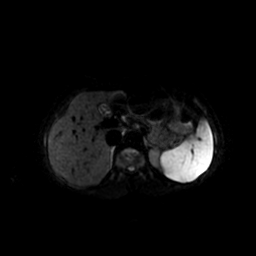
[im 98/98]
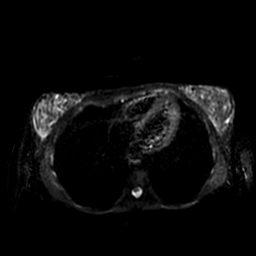

[Series 8: ax dualecho · axial · 5.0mm · 0.78mm/px · z∈[-98,+142]mm · 3 of 98 slices shown]
[im 1/98]
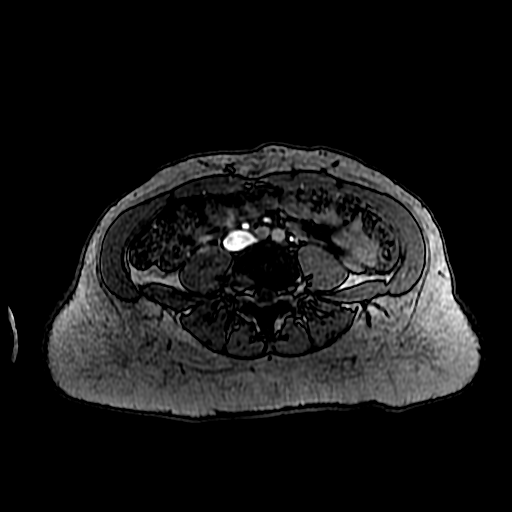
[im 49/98]
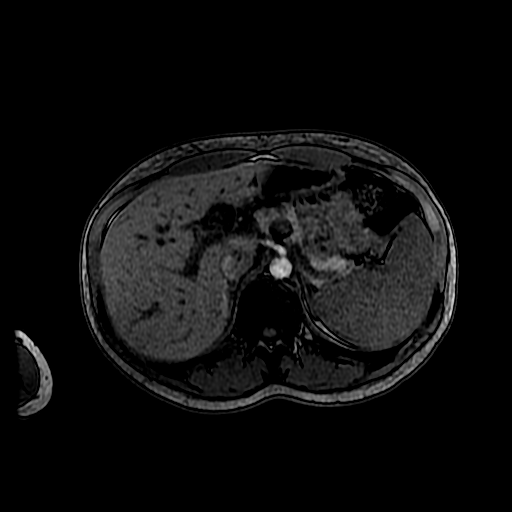
[im 98/98]
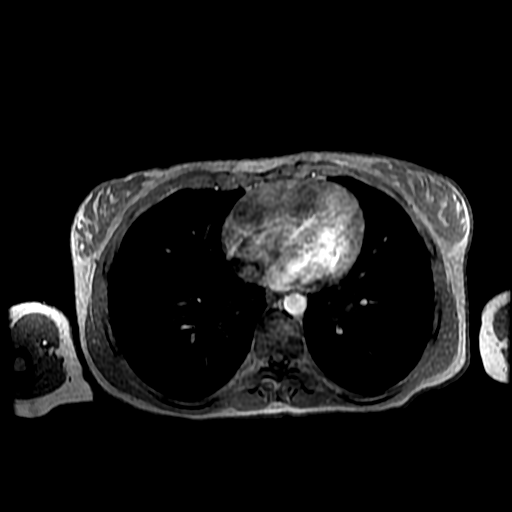

[Series 9: T2 · axial · 5.0mm · 0.78mm/px · z∈[-98,+142]mm · 2 of 49 slices shown (1 of 2)]
[im 1/49]
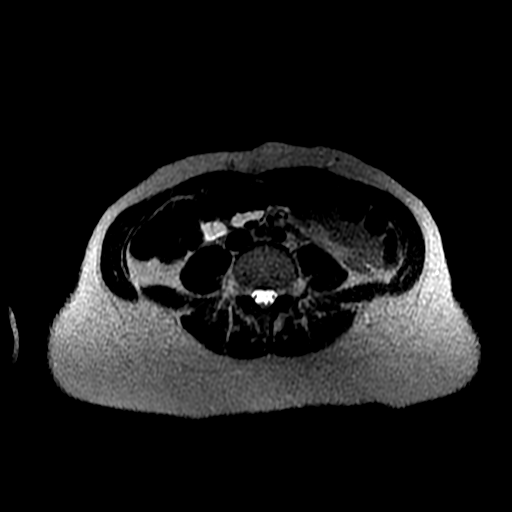
[im 49/49]
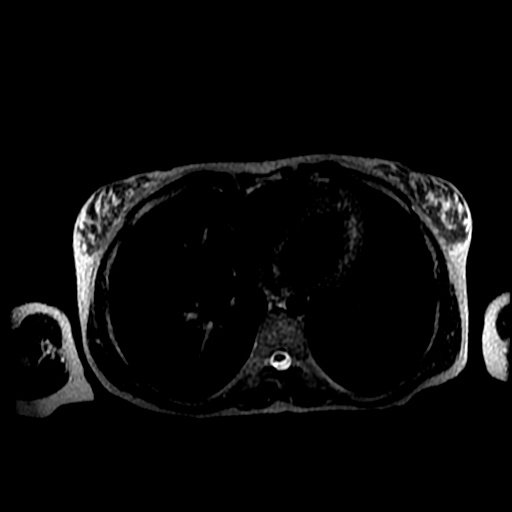

[Series 10: T2 · coronal · 5.0mm · 0.78mm/px · 1 of 41 slices shown (2 of 2)]
[im 1/41]
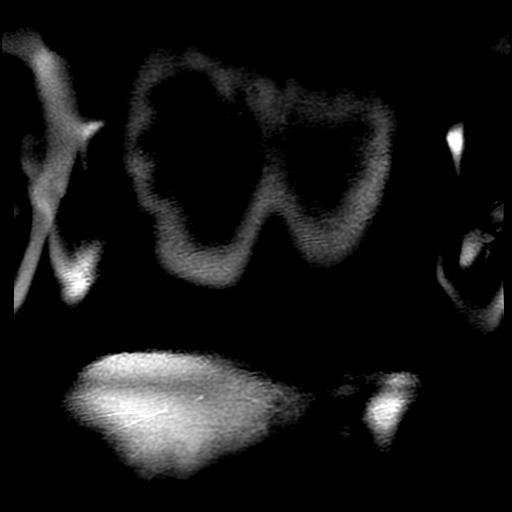

[Series 11: bSSFP · axial · 5.0mm · 0.78mm/px · z∈[-98,+142]mm · 2 of 49 slices shown]
[im 1/49]
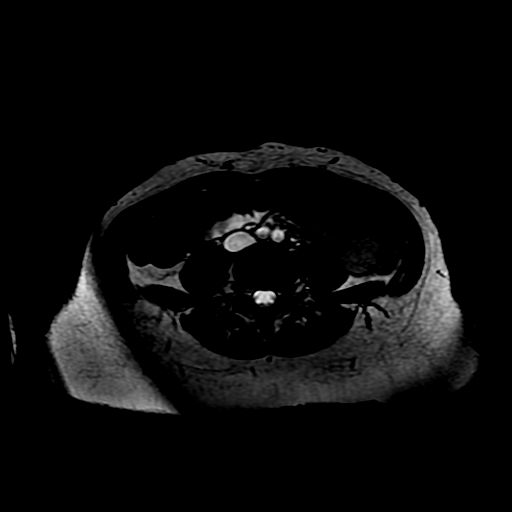
[im 49/49]
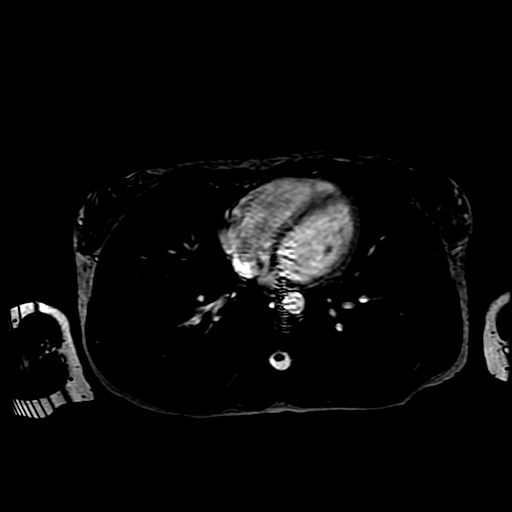

[Series 500: DWI · axial · 5.0mm · 1.56mm/px · z∈[-85,+155]mm · 2 of 49 slices shown]
[im 1/49]
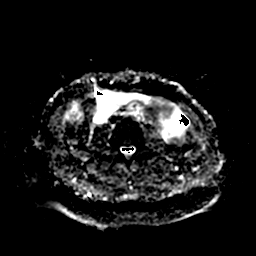
[im 49/49]
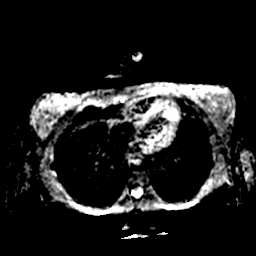

[Series 1200: T1 dynamic · axial · 5.0mm · 0.78mm/px · z∈[-97,+140]mm · 3 of 96 slices shown (1 of 3)]
[im 1/96]
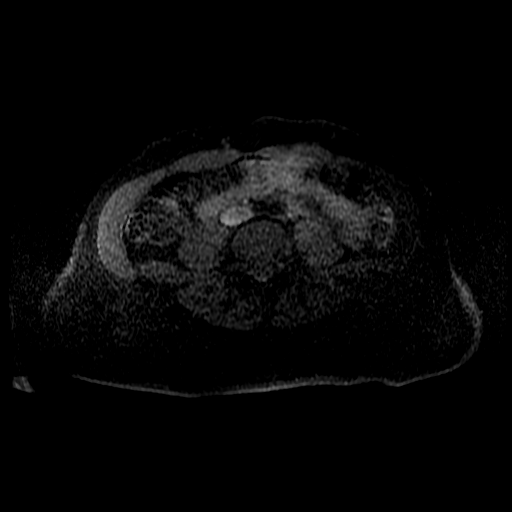
[im 48/96]
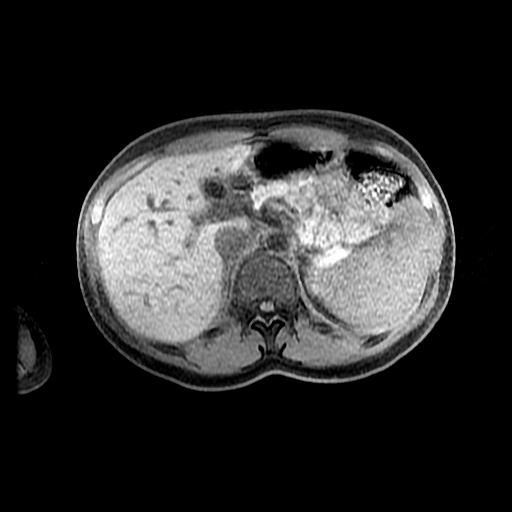
[im 96/96]
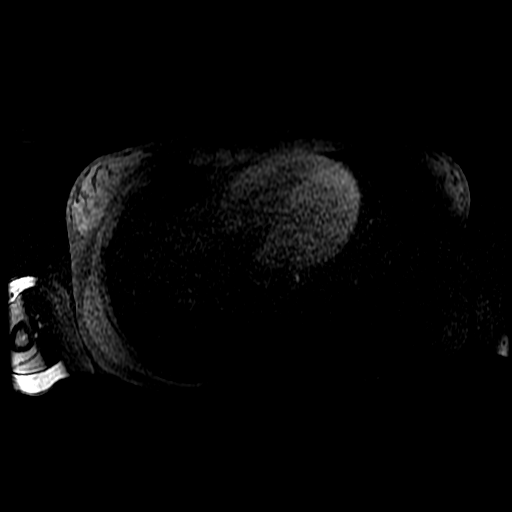

[Series 1201: T1 dynamic · axial · 5.0mm · 0.78mm/px · z∈[-97,+140]mm · 3 of 96 slices shown (2 of 3)]
[im 1/96]
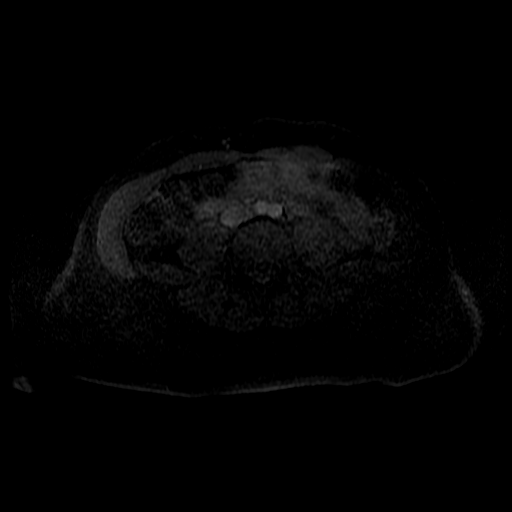
[im 48/96]
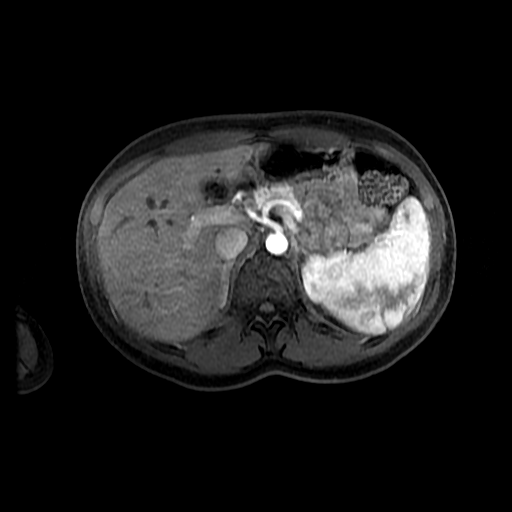
[im 96/96]
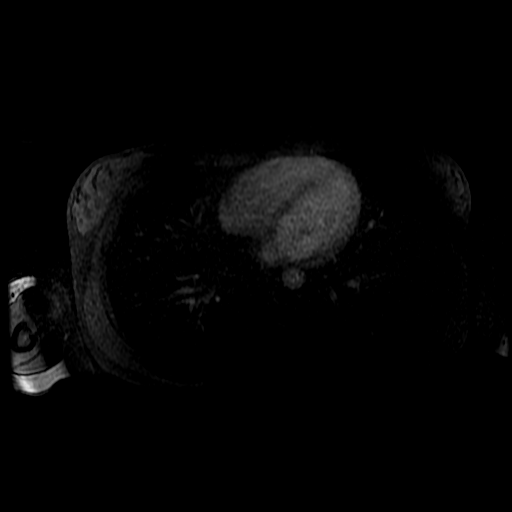

[Series 1202: T1 dynamic · axial · 5.0mm · 0.78mm/px · 1 of 96 slices shown (3 of 3)]
[im 1/96]
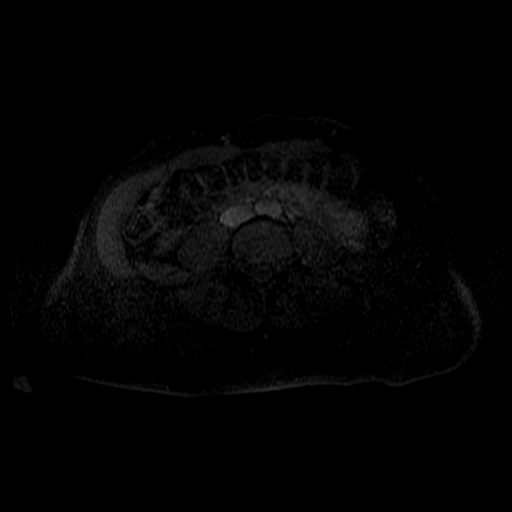

[22 of 48 positions shown; findings below may reference images not displayed]

FINDINGS: Lower chest: Unremarkable

Hepatobiliary: Numerous gallstones fill the mildly contracted
gallbladder. Otherwise unremarkable.

Pancreas:  Unremarkable

Spleen: Along the upper margin of the spleen there is 2.8 by 2.5 cm
lesion with some stellate internal enhancement but general
hypoenhancement relative to the spleen. The lesion fills in with
contrast on the more delayed images, particularly the 6 minutes
images such as image [id number]/[id number]. This lesion is unchanged from
12/31/2013.

Adrenals/Urinary Tract:  Unremarkable

Stomach/Bowel: Unremarkable

Vascular/Lymphatic:  Unremarkable

Other:  No supplemental non-categorized findings.

Musculoskeletal: Mild levoconvex thoracic scoliosis.
IMPRESSION: 1. Upper splenic lesion is unchanged in size over the past 2 years,
and dense demonstrates progressive delayed enhancement. This is most
likely a mildly atypical splenic hemangioma given the fill in of
contrast. The lack of change in size argues against malignancy.
2. Numerous gallstones fill the mildly contracted gallbladder.

## 2018-07-04 ENCOUNTER — Encounter (HOSPITAL_COMMUNITY): Payer: Self-pay | Admitting: Licensed Clinical Social Worker

## 2018-07-04 ENCOUNTER — Ambulatory Visit (INDEPENDENT_AMBULATORY_CARE_PROVIDER_SITE_OTHER): Payer: No Typology Code available for payment source | Admitting: Licensed Clinical Social Worker

## 2018-07-04 DIAGNOSIS — F411 Generalized anxiety disorder: Secondary | ICD-10-CM | POA: Diagnosis not present

## 2018-07-04 MED FILL — ADDERALL XR 25 MG CAPSULE: 25 | 30 days supply | Qty: 30 | Fill #0

## 2018-07-04 NOTE — Progress Notes (Signed)
   THERAPIST PROGRESS NOTE  Session Time: 4:40-5:30pm  Participation Level: Active  Behavioral Response: Well GroomedAlertAnxious  Type of Therapy: Family session  Treatment Goals addressed: Improve psychiatric symptoms, Controlled Behavior, Moderated Mood, Improve Unhelpful Thought Patterns, Emotional Regulation Skills (Moderate moods, anger management, stress management), Feel and express a full Range of Emotions, Learn about Diagnosis, Healthy Coping Skills.  Interventions: CBT  Summary: Cindy Jones is a 28 y.o. female who presents for her counseling session. Pt discussed her psychiatric symptoms and current life events. Pt presents anxious today. She and her mother have their 2nd family session: discussing vulnerability, family roles, sharing feelings, past issues, boundaries. Each was able to speak their truth, be heard in a safe environment. They each discussed next steps. Progress was made in the relationship.   Suicidal/Homicidal: Nowithout intent/plan  Therapist Response: Assessed [name] current functioning and reviewed progress. Assisted pt processing family session with mother.Assisted pt processing for the management of her stressors.  Plan: Return again in 2 weeks.  Diagnosis: Axis I:  GAD    Cindy Jones, LCAS 07/04/2018

## 2018-07-18 ENCOUNTER — Encounter (HOSPITAL_COMMUNITY): Payer: Self-pay | Admitting: Licensed Clinical Social Worker

## 2018-07-18 ENCOUNTER — Other Ambulatory Visit: Payer: Self-pay

## 2018-07-18 ENCOUNTER — Ambulatory Visit (INDEPENDENT_AMBULATORY_CARE_PROVIDER_SITE_OTHER): Payer: No Typology Code available for payment source | Admitting: Licensed Clinical Social Worker

## 2018-07-18 DIAGNOSIS — F411 Generalized anxiety disorder: Secondary | ICD-10-CM

## 2018-07-18 NOTE — Progress Notes (Signed)
   THERAPIST PROGRESS NOTE  Session Time: 3:40-4:30pm  Participation Level: Active  Behavioral Response: Well GroomedAlertAnxious  Type of Therapy: Individual therapy  Treatment Goals addressed: Improve psychiatric symptoms, Controlled Behavior, Moderated Mood, Improve Unhelpful Thought Patterns, Emotional Regulation Skills (Moderate moods, anger management, stress management), Feel and express a full Range of Emotions, Learn about Diagnosis, Healthy Coping Skills.  Interventions: CBT  Summary: CIENA ERB is a 28 y.o. female who presents for her counseling session. Pt discussed her psychiatric symptoms and current life events. Pt presents anxious today. Her anxiety symptoms have increased due to the coronavirus and having to work from home, her husband is a Engineer, civil (consulting). Processed each stressor with pt. Pt also discussed her 2nd rejection at work for a promotion. Asked open ended questions and used empathic reflection. Asked pt open ended questions about the family session with her mother. Pt talked about her feelings of her childhood brought up in the session. Validated her feelings.      Suicidal/Homicidal: Nowithout intent/plan  Therapist Response: Assessed pt's current functioning and reviewed progress. Assisted pt processing her current stressors, ffamily session with mother, feeling surrounding her childhood discussed in family session.  Assisted pt processing for the management of her stressors.  Plan: Return again in 2 weeks.  Diagnosis: Axis I:  GAD    [name],[name], LCAS 07/18/2018

## 2018-08-01 ENCOUNTER — Ambulatory Visit (INDEPENDENT_AMBULATORY_CARE_PROVIDER_SITE_OTHER): Payer: No Typology Code available for payment source | Admitting: Licensed Clinical Social Worker

## 2018-08-01 ENCOUNTER — Other Ambulatory Visit: Payer: Self-pay

## 2018-08-01 DIAGNOSIS — F411 Generalized anxiety disorder: Secondary | ICD-10-CM | POA: Diagnosis not present

## 2018-08-05 ENCOUNTER — Encounter (HOSPITAL_COMMUNITY): Payer: Self-pay | Admitting: Licensed Clinical Social Worker

## 2018-08-05 MED FILL — AMPHETAMINE-DEXTROAMPHETAMI: 20 | 30 days supply | Qty: 30 | Fill #0

## 2018-08-05 MED FILL — ADDERALL XR 25 MG CAPSULE: 25 | 30 days supply | Qty: 30 | Fill #0

## 2018-08-05 NOTE — Progress Notes (Signed)
Virtual Visit via Video Note  I connected with Cindy Jones on 08/01/18 at  4:30 PM EDT by a video enabled telemedicine application and verified that I am speaking with the correct person using two identifiers.   I discussed the limitations of evaluation and management by telemedicine and the availability of in person appointments. The patient expressed understanding and agreed to proceed.  History of Present Illness: Pt was referred to therapy by her psychiatrist at [facility] for anxiety and panic attacks.    Observations/Objective: Reduce overall level, frequency and intensity of anxiety so that daily functioning is not impaired. Pt presents anxious during the webex video session.   Assessment and Plan: Pt reports that her anxiety has intensified. "my husband is a Engineer, civil (consulting) at the hospital and Im worried about him getting the coronavirus." said  Tearfully. I don't feel that im helping enough. My best friends also work in the [employer]. Validated pt's feelings. Processed with pt how she can be present in the moment, how she can help. She decided to make facemasks and send them to the hospital. Encouraged pt to continue to use her coping skills that have worked in the past for her anxiety. Emailed new list of coping skills to pt.   Follow Up Instructions:    I discussed the assessment and treatment plan with the patient. The patient was provided an opportunity to ask questions and all were answered. The patient agreed with the plan and demonstrated an understanding of the instructions.   The patient was advised to call back or seek an in-person evaluation if the symptoms worsen or if the condition fails to improve as anticipated.  I provided 50 minutes of non-face-to-face time during this encounter.   Dagmawi Venable S, LCAS

## 2018-08-08 ENCOUNTER — Encounter (HOSPITAL_COMMUNITY): Payer: Self-pay | Admitting: Licensed Clinical Social Worker

## 2018-08-08 ENCOUNTER — Other Ambulatory Visit: Payer: Self-pay

## 2018-08-08 ENCOUNTER — Ambulatory Visit (INDEPENDENT_AMBULATORY_CARE_PROVIDER_SITE_OTHER): Payer: No Typology Code available for payment source | Admitting: Licensed Clinical Social Worker

## 2018-08-08 DIAGNOSIS — F411 Generalized anxiety disorder: Secondary | ICD-10-CM | POA: Diagnosis not present

## 2018-08-08 NOTE — Progress Notes (Signed)
Virtual Visit via Video Note  I connected with Cindy Jones on 08/08/18 at  2:30 PM EDT by a video enabled telemedicine application and verified that I am speaking with the correct person using two identifiers.   I discussed the limitations of evaluation and management by telemedicine and the availability of in person appointments. The patient expressed understanding and agreed to proceed.  History of Present Illness: Pt was referred to therapy by her psychiatrist at [facility] for anxiety and panic attacks.   Participation Level: Active   Type of Therapy: Individual via Webex  Treatment Goals addressed: Improve psychiatric symptoms, Controlled Behavior, Moderated Mood, Improve Unhelpful Thought Patterns, Emotional Regulation Skills (Moderate moods, anger management, stress management), Feel and express a full Range of Emotions, Learn about Diagnosis, Healthy Coping Skills.  Interventions: CBT  Summary: Cindy Jones is a 28 y.o. female who presents for her counseling session via webex . Pt discussed her psychiatric symptoms and current life events. Pt presents anxious today. She is working at home, but bored. Asked opened questions. I'm not sure why im bored. Suggested she review some of the emails worksheets I sent her. We discussed Happiness and the 5 ways to increase inner happiness.    Suicidal/Homicidal: Nowithout intent/plan  Therapist Response: Assessed pt's current functioning and reviewed progress.[name] anxiety symptoms, life events, Reviewed handouts for anxiety during a stressful time, happiness. Assisted pt processing for the management of her stressors.  Plan: See pt invidually weekly via webex  Participation Level: Active  Diagnosis: Axis I:  GAD    Follow Up Instructions:    I discussed the assessment and treatment plan with the patient. The patient was provided an opportunity to ask questions and all were answered. The patient agreed with the plan and demonstrated an  understanding of the instructions.   The patient was advised to call back or seek an in-person evaluation if the symptoms worsen or if the condition fails to improve as anticipated.  I provided 50 minutes of non-face-to-face time during this encounter.   Marabelle Cushman S, LCAS       Havard Radigan S, LCAS 08/08/2018

## 2018-08-10 ENCOUNTER — Ambulatory Visit (INDEPENDENT_AMBULATORY_CARE_PROVIDER_SITE_OTHER): Payer: Self-pay | Admitting: Nurse Practitioner

## 2018-08-10 VITALS — BP 100/72 | HR 67 | Temp 98.7°F | Resp 14 | Wt 175.8 lb

## 2018-08-10 DIAGNOSIS — B9789 Other viral agents as the cause of diseases classified elsewhere: Secondary | ICD-10-CM

## 2018-08-10 DIAGNOSIS — J019 Acute sinusitis, unspecified: Secondary | ICD-10-CM

## 2018-08-10 MED ORDER — CETIRIZINE HCL 10 MG PO TABS: 10.0000 mg | ORAL_TABLET | Freq: Every day | ORAL | 0 refills | Status: DC

## 2018-08-10 MED ORDER — FLUTICASONE PROPIONATE 50 MCG/ACT NA SUSP: 2.0000 | Freq: Every day | NASAL | 0 refills | Status: DC

## 2018-08-10 MED ORDER — MONTELUKAST SODIUM 10 MG PO TABS: 10.0000 mg | ORAL_TABLET | Freq: Every day | ORAL | 0 refills | Status: DC

## 2018-08-10 MED ORDER — PSEUDOEPHEDRINE HCL 60 MG PO TABS: 60.0000 mg | ORAL_TABLET | Freq: Four times a day (QID) | ORAL | 0 refills | Status: DC | PRN

## 2018-08-10 NOTE — Progress Notes (Signed)
MRN: [id number] DOB: 11-Oct-1990  Subjective:   Cindy Jones is a 28 y.o. female presenting for chief complaint of right ear pain and sinus pressure (x1 day (zyrtec, flonase, advil)) .  Reports a 1 day history of sinus headache, sinus pain, ear fullness and sore throat, decreased taste, right sided-facial swelling, right sided tooth pain, decreased appetite and fatigue. Has tried one dose of Flonase, zyrtec and advil for relief. Denies fever, sinus congestion , itchy watery eyes, red eyes, difficulty swallowing, inability to swallow, voice change, dry cough, productive cough, wheezing, shortness of breath, chest tightness, chest pain and myalgia, chills, malaise, nausea, vomiting, abdominal pain and diarrhea. Has not had sick contact with influenza or COVID-19, but does inform her husband is a Engineer, civil (consulting)nurse and has been working at the hospital. Admits to a history of seasonal allergies, history of  childhood asthma. Patient informs she spent a lot of time outside prior to the onset of her symptoms. Patient has had a  flu shot this season. Denies smoking. Denies any other aggravating or relieving factors, no other questions or concerns.  Review of Systems  Constitutional: Positive for malaise/fatigue.  HENT: Positive for sinus pain and sore throat. Negative for congestion, ear discharge and hearing loss.        Right ear pressure/fullness, + PND.  See HPI.  Eyes: Negative.   Respiratory: Negative.   Cardiovascular: Negative.   Gastrointestinal: Negative.   Skin: Negative.   Neurological: Positive for headaches. Negative for dizziness.  Endo/Heme/Allergies: Positive for environmental allergies.    Cindy Jones has a current medication list which includes the following prescription(s): amphetamine-dextroamphetamine, amphetamine-dextroamphetamine, amphetamine-dextroamphetamine, amphetamine-dextroamphetamine, aspirin-acetaminophen-caffeine, melatonin, fish oil, triamcinolone cream, and  amphetamine-dextroamphetamine. Also is allergic to latex; cranberry juice powder; naproxen; nickel; and sulfa antibiotics.  Cindy Jones  has a past medical history of Anxiety, Asthma, Binge eating disorder, Dysrhythmia, Frequent headaches, GAD (generalized anxiety disorder), GERD (gastroesophageal reflux disease), Major depression in complete remission (HCC), Migraines, PID (acute pelvic inflammatory disease), Seasonal allergies, Tourette disorder, and Urinary tract bacterial infections. Also  has a past surgical history that includes Tonsillectomy and adenoidectomy (2013); Wisdom tooth extraction; Esophagogastroduodenoscopy; Laparoscopic cholecystectomy single site with intraoperative cholangiogram (N/A, 03/15/2016); and Cholecystectomy.   Objective:   Vitals: BP 100/72   Pulse 67   Temp 98.7 F (37.1 C)   Resp 14   Wt 175 lb 12.8 oz (79.7 kg)   SpO2 98%   BMI 28.81 kg/m   Physical Exam Vitals signs reviewed.  Constitutional:      General: She is not in acute distress. HENT:     Head: Normocephalic.     Right Ear: Ear canal and external ear normal. A middle ear effusion is present. Tympanic membrane is erythematous (mild at base of TM). Tympanic membrane is not bulging.     Left Ear: Ear canal and external ear normal. No tenderness. A middle ear effusion is present. Tympanic membrane is not erythematous or bulging.     Ears:     Comments: Effusion is worse on the right    Nose: Mucosal edema and rhinorrhea present. No congestion.     Right Turbinates: Enlarged and swollen.     Left Turbinates: Enlarged and swollen.     Right Sinus: Maxillary sinus tenderness and frontal sinus tenderness present.     Left Sinus: Maxillary sinus tenderness and frontal sinus tenderness present.     Comments: Sinus tenderness is worse on right, + right-sided maxillary facial swelling    Mouth/Throat:  Lips: Pink.     Mouth: Mucous membranes are moist.     Pharynx: Uvula midline. Posterior oropharyngeal  erythema present. No oropharyngeal exudate or uvula swelling.     Tonsils: No tonsillar exudate or tonsillar abscesses. 0 on the right. 0 on the left.  Eyes:     Conjunctiva/sclera: Conjunctivae normal.     Pupils: Pupils are equal, round, and reactive to light.  Neck:     Musculoskeletal: Normal range of motion and neck supple.  Cardiovascular:     Rate and Rhythm: Normal rate and regular rhythm.     Pulses: Normal pulses.     Heart sounds: Normal heart sounds.  Pulmonary:     Effort: Pulmonary effort is normal. No respiratory distress.     Breath sounds: Normal breath sounds. No stridor. No wheezing, rhonchi or rales.  Abdominal:     General: Bowel sounds are normal.     Palpations: Abdomen is soft.     Tenderness: There is no abdominal tenderness.  Lymphadenopathy:     Cervical: No cervical adenopathy.  Skin:    General: Skin is warm and dry.  Neurological:     General: No focal deficit present.     Mental Status: She is alert and oriented to person, place, and time.  Psychiatric:        Mood and Affect: Mood normal.        Behavior: Behavior normal.     Centor Score Tonsillar Exudate: 0 Fever/Hx of fever >39 Degrees Celsius: 0 Tender anterior cervical lymphadenopathy: 0 Age 16- 44: 0 Total:  Assessment and Plan :   Exam findings, diagnosis etiology and medication use and indications reviewed with patient. Follow- Up and discharge instructions provided. No emergent/urgent issues found on exam. Patient's physical exam findings, symptom onset and symptoms are consistent with that of viral etiology at this time.  Patient has  right frontal sinus pressure, toothache pain and right ear pressure/fullness with postnasal drip consistent with that of a viral infection.  Patient does not display any signs of bacterial infection at this time as she has not had fever, purulent nasal drainage, and only 1 day of symptoms. I feel patient's symptoms were exacerbated by her time spent  outside with periods of high pollen counts.  I am unable to prescribe Sudafed as patient has a history of dysrhythmia and is currently on Adderall. I am going to  prescribe other symptomatic treatment to include flonase, Singulair at bedtime and  Zyrtec at each morning.  Informed patient that using normal saline will help with her viral sinusitis becoming bacterial as it will wash out the sinuses and nasal passages.   I am going to recommend she try symptomatic treatment for another 7 days to see if this will help.  I explained the viral process to the patient and informed her clinical presentation and symptom duration do not warrant an antibiotic at this time, in addition to antibiotics not being helpful in the case of viral infections.  Patient was in agreement with this treatment plan.  Patient education was provided. Patient verbalized understanding of information provided and agrees with plan of care (POC), all questions answered. The patient is advised to call or return to clinic if conditiondoes not see an improvement in symptoms, or to seek the care of the closest emergency department if conditionworsens with the above plan.  1. Acute viral sinusitis  - fluticasone (FLONASE) 50 MCG/ACT nasal spray; Place 2 sprays into both nostrils daily for 10  days.  Dispense: 16 g; Refill: 0 - cetirizine (ZYRTEC) 10 MG tablet; Take 1 tablet (10 mg total) by mouth daily for 30 days.  Dispense: 30 tablet; Refill: 0 - montelukast (SINGULAIR) 10 MG tablet; Take 1 tablet (10 mg total) by mouth at bedtime for 30 days.  Dispense: 30 tablet; Refill: 0 -Take medication as prescribed. -Ibuprofen or Tylenol for pain, fever, or general discomfort. -Increase fluids. -Get plenty of rest -Avoid being outside while symptoms persist. -Sleep elevated on at least 2 pillows at bedtime with nasal congestion. -Use a humidifier or vaporizer when at home to help with nasal congestion. -May use a teaspoon of honey or  over-the-counter cough drops to help with sore throat. -May use normal saline nasal spray to help with nasal congestion throughout the day. -Follow-up with our office within the next 7-10 days if symptoms rapidly worsen or do not improve. If patient's symptoms do not improve during this time or rapidly worsen, an antibiotic such as Augmentin 875/125 twice daily would be reasonable.

## 2018-08-10 NOTE — Patient Instructions (Signed)
Sinusitis, Adult -Take medication as prescribed. -Ibuprofen or Tylenol for pain, fever, or general discomfort. -Increase fluids. -Get plenty of rest -Avoid being outside while symptoms persist. -Sleep elevated on at least 2 pillows at bedtime with nasal congestion. -Use a humidifier or vaporizer when at home to help with nasal congestion. -May use a teaspoon of honey or over-the-counter cough drops to help with sore throat. -May use normal saline nasal spray to help with nasal congestion throughout the day. -Follow-up with our office within the next 7-10 days if symptoms rapidly worsen or do not improve.    Sinusitis is inflammation of your sinuses. Sinuses are hollow spaces in the bones around your face. Your sinuses are located:  Around your eyes.  In the middle of your forehead.  Behind your nose.  In your cheekbones. Mucus normally drains out of your sinuses. When your nasal tissues become inflamed or swollen, mucus can become trapped or blocked. This allows bacteria, viruses, and fungi to grow, which leads to infection. Most infections of the sinuses are caused by a virus. Sinusitis can develop quickly. It can last for up to 4 weeks (acute) or for more than 12 weeks (chronic). Sinusitis often develops after a cold. What are the causes? This condition is caused by anything that creates swelling in the sinuses or stops mucus from draining. This includes:  Allergies.  Asthma.  Infection from bacteria or viruses.  Deformities or blockages in your nose or sinuses.  Abnormal growths in the nose (nasal polyps).  Pollutants, such as chemicals or irritants in the air.  Infection from fungi (rare). What increases the risk? You are more likely to develop this condition if you:  Have a weak body defense system (immune system).  Do a lot of swimming or diving.  Overuse nasal sprays.  Smoke. What are the signs or symptoms? The main symptoms of this condition are pain and a  feeling of pressure around the affected sinuses. Other symptoms include:  Stuffy nose or congestion.  Thick drainage from your nose.  Swelling and warmth over the affected sinuses.  Headache.  Upper toothache.  A cough that may get worse at night.  Extra mucus that collects in the throat or the back of the nose (postnasal drip).  Decreased sense of smell and taste.  Fatigue.  A fever.  Sore throat.  Bad breath. How is this diagnosed? This condition is diagnosed based on:  Your symptoms.  Your medical history.  A physical exam.  Tests to find out if your condition is acute or chronic. This may include: ? Checking your nose for nasal polyps. ? Viewing your sinuses using a device that has a light (endoscope). ? Testing for allergies or bacteria. ? Imaging tests, such as an MRI or CT scan. In rare cases, a bone biopsy may be done to rule out more serious types of fungal sinus disease. How is this treated? Treatment for sinusitis depends on the cause and whether your condition is chronic or acute.  If caused by a virus, your symptoms should go away on their own within 10 days. You may be given medicines to relieve symptoms. They include: ? Medicines that shrink swollen nasal passages (topical intranasal decongestants). ? Medicines that treat allergies (antihistamines). ? A spray that eases inflammation of the nostrils (topical intranasal corticosteroids). ? Rinses that help get rid of thick mucus in your nose (nasal saline washes).  If caused by bacteria, your health care provider may recommend waiting to see if your  symptoms improve. Most bacterial infections will get better without antibiotic medicine. You may be given antibiotics if you have: ? A severe infection. ? A weak immune system.  If caused by narrow nasal passages or nasal polyps, you may need to have surgery. Follow these instructions at home: Medicines  Take, use, or apply over-the-counter and  prescription medicines only as told by your health care provider. These may include nasal sprays.  If you were prescribed an antibiotic medicine, take it as told by your health care provider. Do not stop taking the antibiotic even if you start to feel better. Hydrate and humidify   Drink enough fluid to keep your urine pale yellow. Staying hydrated will help to thin your mucus.  Use a cool mist humidifier to keep the humidity level in your home above 50%.  Inhale steam for 10-15 minutes, 3-4 times a day, or as told by your health care provider. You can do this in the bathroom while a hot shower is running.  Limit your exposure to cool or dry air. Rest  Rest as much as possible.  Sleep with your head raised (elevated).  Make sure you get enough sleep each night. General instructions   Apply a warm, moist washcloth to your face 3-4 times a day or as told by your health care provider. This will help with discomfort.  Wash your hands often with soap and water to reduce your exposure to germs. If soap and water are not available, use hand sanitizer.  Do not smoke. Avoid being around people who are smoking (secondhand smoke).  Keep all follow-up visits as told by your health care provider. This is important. Contact a health care provider if:  You have a fever.  Your symptoms get worse.  Your symptoms do not improve within 10 days. Get help right away if:  You have a severe headache.  You have persistent vomiting.  You have severe pain or swelling around your face or eyes.  You have vision problems.  You develop confusion.  Your neck is stiff.  You have trouble breathing. Summary  Sinusitis is soreness and inflammation of your sinuses. Sinuses are hollow spaces in the bones around your face.  This condition is caused by nasal tissues that become inflamed or swollen. The swelling traps or blocks the flow of mucus. This allows bacteria, viruses, and fungi to grow,  which leads to infection.  If you were prescribed an antibiotic medicine, take it as told by your health care provider. Do not stop taking the antibiotic even if you start to feel better.  Keep all follow-up visits as told by your health care provider. This is important. This information is not intended to replace advice given to you by your health care provider. Make sure you discuss any questions you have with your health care provider. Document Released: 04/17/2005 Document Revised: 09/17/2017 Document Reviewed: 09/17/2017 Elsevier Interactive Patient Education  2019 [employer].

## 2018-08-12 ENCOUNTER — Telehealth: Payer: Self-pay | Admitting: Nurse Practitioner

## 2018-08-12 ENCOUNTER — Encounter (HOSPITAL_COMMUNITY): Payer: Self-pay | Admitting: Licensed Clinical Social Worker

## 2018-08-12 NOTE — Telephone Encounter (Signed)
Pt called before I called to inform us her right side of face is swollen. She was informed to continue warm compresses and take ibuprofen as needed. She was also told to call back within 1-2 days if her symptoms have not gotten any better so a antibiotic could be prescribed.

## 2018-08-15 ENCOUNTER — Other Ambulatory Visit: Payer: Self-pay

## 2018-08-15 ENCOUNTER — Ambulatory Visit (INDEPENDENT_AMBULATORY_CARE_PROVIDER_SITE_OTHER): Payer: No Typology Code available for payment source | Admitting: Licensed Clinical Social Worker

## 2018-08-15 ENCOUNTER — Encounter (HOSPITAL_COMMUNITY): Payer: Self-pay | Admitting: Licensed Clinical Social Worker

## 2018-08-15 DIAGNOSIS — F411 Generalized anxiety disorder: Secondary | ICD-10-CM | POA: Diagnosis not present

## 2018-08-15 NOTE — Progress Notes (Signed)
Virtual Visit via Video Note  I connected with Cindy Jones on 08/15/18 at  4:30 PM EDT by a video enabled telemedicine application and verified that I am speaking with the correct person using two identifiers.   I discussed the limitations of evaluation and management by telemedicine and the availability of in person appointments. The patient expressed understanding and agreed to proceed.  History of Present Illness: Pt was referred to therapy by her psychiatrist at [facility] for anxiety and panic attacks.   Participation Level: Active   Type of Therapy: Individual via Webex  Treatment Goals addressed: Improve psychiatric symptoms, Controlled Behavior, Moderated Mood, Improve Unhelpful Thought Patterns, Emotional Regulation Skills (Moderate moods, anger management, stress management), Feel and express a full Range of Emotions, Learn about Diagnosis, Healthy Coping Skills.  Interventions: CBT  Summary: Cindy Jones is a 28 y.o. female who presents for her counseling session via webex . Pt discussed her psychiatric symptoms and current life events. Pt presents anxious today. She and her husband bought a house. She has had a panic attack because of the whole buying a house experience. In addition, her husband is a Engineer, civil (consulting) and wants to volunteer in the Covid virus floor at the hospital. She and her husband have communicated effectively and sharing feelings, fears. Pt is struggling with "stay at home" order. Discussed options to face time family and friends. Discussed coping skills for her anxiety and panic attacks. Sent coping skills and self care worksheets email to pt. Reviewed worksheets with pt.    Suicidal/Homicidal: Nowithout intent/plan  Therapist Response: Assessed pt's current functioning and reviewed progress.. Assisted with her anxiety symptoms, life events, Reviewed handouts for coping skills and self care. Assisted pt processing for the management of her stressors.  Plan: See pt  invidually weekly via webex  Participation Level: Active  Diagnosis: Axis I:  GAD    Follow Up Instructions:    I discussed the assessment and treatment plan with the patient. The patient was provided an opportunity to ask questions and all were answered. The patient agreed with the plan and demonstrated an understanding of the instructions.   The patient was advised to call back or seek an in-person evaluation if the symptoms worsen or if the condition fails to improve as anticipated.  I provided 50 minutes of non-face-to-face time during this encounter.   , S, LCAS       , S, LCAS 08/08/2018

## 2018-08-22 ENCOUNTER — Encounter (HOSPITAL_COMMUNITY): Payer: Self-pay | Admitting: Licensed Clinical Social Worker

## 2018-08-22 ENCOUNTER — Ambulatory Visit (INDEPENDENT_AMBULATORY_CARE_PROVIDER_SITE_OTHER): Payer: No Typology Code available for payment source | Admitting: Licensed Clinical Social Worker

## 2018-08-22 ENCOUNTER — Other Ambulatory Visit: Payer: Self-pay

## 2018-08-22 DIAGNOSIS — F411 Generalized anxiety disorder: Secondary | ICD-10-CM

## 2018-08-22 NOTE — Progress Notes (Signed)
Virtual Visit via Video Note  I connected with Cindy Jones on 08/22/18 at  3:30 PM EDT by a video enabled telemedicine application and verified that I am speaking with the correct person using two identifiers.   I discussed the limitations of evaluation and management by telemedicine and the availability of in person appointments. The patient expressed understanding and agreed to proceed.  History of Present Illness: Pt was referred to therapy by her psychiatrist at [facility] for anxiety and panic attacks.   Participation Level: Active   Type of Therapy: Individual via Webex  Treatment Goals addressed: Improve psychiatric symptoms, Controlled Behavior, Moderated Mood, Improve Unhelpful Thought Patterns, Emotional Regulation Skills (Moderate moods, anger management, stress management), Feel and express a full Range of Emotions, Learn about Diagnosis, Healthy Coping Skills.  Interventions: CBT/Supportive/Psychoeducation  Summary: Cindy Jones is a 28 y.o. female who presents for her counseling session via webex . Pt discussed her psychiatric symptoms and current life events. Pt presents anxious today. "I had a melt down last week about updating the kitchen in my new house." Assisted pt compartmentalizing her goals for the house. Assisted pt making strategies to  Achieve her goals in a timely manner. Assisted pt communicating with her husband, feeling heard. Educated pt on coping skills to use instead of having a Personnel officer."  Suicidal/Homicidal: Nowithout intent/plan  Therapist Response: Assessed pt's current functioning and reviewed progress.. Assisted with her anxiety symptoms, life events, goals, strategies, compartmentaliziation, coping skills and importance of self care. Assisted pt processing for the management of her stressors.  Plan: See pt invidually weekly via webex  Participation Level: Active  Diagnosis: Axis I:  GAD    Follow Up Instructions:    I discussed the assessment and  treatment plan with the patient. The patient was provided an opportunity to ask questions and all were answered. The patient agreed with the plan and demonstrated an understanding of the instructions.   The patient was advised to call back or seek an in-person evaluation if the symptoms worsen or if the condition fails to improve as anticipated.  I provided 50 minutes of non-face-to-face time during this encounter.   [name],[name], LCAS

## 2018-08-29 ENCOUNTER — Ambulatory Visit (INDEPENDENT_AMBULATORY_CARE_PROVIDER_SITE_OTHER): Payer: No Typology Code available for payment source | Admitting: Licensed Clinical Social Worker

## 2018-08-29 ENCOUNTER — Other Ambulatory Visit: Payer: Self-pay

## 2018-08-29 ENCOUNTER — Encounter (HOSPITAL_COMMUNITY): Payer: Self-pay | Admitting: Licensed Clinical Social Worker

## 2018-08-29 DIAGNOSIS — F988 Other specified behavioral and emotional disorders with onset usually occurring in childhood and adolescence: Secondary | ICD-10-CM

## 2018-08-29 DIAGNOSIS — F411 Generalized anxiety disorder: Secondary | ICD-10-CM | POA: Diagnosis not present

## 2018-08-29 NOTE — Progress Notes (Signed)
Virtual Visit via Video Note  I connected with Cindy Jones on 08/29/18 at  4:30 PM EDT by a video enabled telemedicine application and verified that I am speaking with the correct person using two identifiers.   I discussed the limitations of evaluation and management by telemedicine and the availability of in person appointments. The patient expressed understanding and agreed to proceed.  History of Present Illness: Pt was referred to therapy by her psychiatrist at [facility] for anxiety and panic attacks.   Participation Level: Active   Type of Therapy: Individual via Webex  Treatment Goals addressed: Improve psychiatric symptoms, Controlled Behavior, Moderated Mood, Improve Unhelpful Thought Patterns, Emotional Regulation Skills (Moderate moods, anger management, stress management), Feel and express a full Range of Emotions, Learn about Diagnosis, Healthy Coping Skills.  Interventions: CBT/Supportive/Psychoeducation  Summary: Cindy Jones is a 28 y.o. female who presents for her counseling session via webex . Pt discussed her psychiatric symptoms and current life events. Pt presents anxious and hyper today. "My ADD meds have worn off." Discussed her symptoms and strategies to use in the afternoons after her meds wear off. Pt reports she and her husband are having trouble communicating effectively. He uses passive aggressive communication style.Coached pt on effective communication styles: passive, aggressive, assertive, passive-aggressive. Sent pt email with effective communication styles.  Suicidal/Homicidal: Nowithout intent/plan  Therapist Response: Assessed pt's current functioning and reviewed progress.. Assisted with her anxiety symptoms, life events, communication styles, ADD strategies. Assisted pt processing for the management of her stressors.  Plan: See pt invidually weekly via webex  Participation Level: Active  Diagnosis: Axis I:  GAD    Follow Up Instructions:    I  discussed the assessment and treatment plan with the patient. The patient was provided an opportunity to ask questions and all were answered. The patient agreed with the plan and demonstrated an understanding of the instructions.   The patient was advised to call back or seek an in-person evaluation if the symptoms worsen or if the condition fails to improve as anticipated.  I provided 60 minutes of non-face-to-face time during this encounter.   Armstead Heiland S, LCAS

## 2018-08-30 ENCOUNTER — Other Ambulatory Visit: Payer: Self-pay | Admitting: Nurse Practitioner

## 2018-08-30 DIAGNOSIS — B9789 Other viral agents as the cause of diseases classified elsewhere: Secondary | ICD-10-CM

## 2018-08-30 DIAGNOSIS — J019 Acute sinusitis, unspecified: Principal | ICD-10-CM

## 2018-09-03 MED FILL — ADDERALL XR 25 MG CAPSULE: 25 | 30 days supply | Qty: 30 | Fill #0

## 2018-09-05 ENCOUNTER — Ambulatory Visit (INDEPENDENT_AMBULATORY_CARE_PROVIDER_SITE_OTHER): Payer: No Typology Code available for payment source | Admitting: Licensed Clinical Social Worker

## 2018-09-05 ENCOUNTER — Encounter (HOSPITAL_COMMUNITY): Payer: Self-pay | Admitting: Licensed Clinical Social Worker

## 2018-09-05 ENCOUNTER — Other Ambulatory Visit: Payer: Self-pay

## 2018-09-05 DIAGNOSIS — F411 Generalized anxiety disorder: Secondary | ICD-10-CM | POA: Diagnosis not present

## 2018-09-05 NOTE — Progress Notes (Signed)
Virtual Visit via Video Note  I connected with Cindy Jones on 09/05/18 at 12:00 PM EDT by a video enabled telemedicine application and verified that I am speaking with the correct person using two identifiers.   I discussed the limitations of evaluation and management by telemedicine and the availability of in person appointments. The patient expressed understanding and agreed to proceed.  History of Present Illness: Pt was referred to therapy by her psychiatrist at [facility] for anxiety and panic attacks.   Participation Level: Active   Type of Therapy: Individual via Webex  Treatment Goals addressed: Improve psychiatric symptoms, Controlled Behavior, Moderated Mood, Improve Unhelpful Thought Patterns, Emotional Regulation Skills (Moderate moods, anger management, stress management), Feel and express a full Range of Emotions, Learn about Diagnosis, Healthy Coping Skills.  Interventions: CBT/Supportive/Psychoeducation  Summary: Cindy Jones is a 28 y.o. female who presents anxious for her counseling session with her husband via webex . Pt discussed her psychiatric symptoms and current life events. She asked her husband to join the session because they are struggling to communicate effectively. Educated them on styles of communication, had them role play effective communication styles using I statements. Educated pt on the importance of problem solving. Had them practice problem-solving together.      Suicidal/Homicidal: Nowithout intent/plan  Therapist Response: Assessed pt's current functioning and reviewed progress.. Assisted pt and her husband processing effective communication styles and problem solving.  Assisted pt processing for the management of her stressors.  Plan: See pt invidually weekly via webex  Participation Level: Active  Diagnosis: Axis I:  GAD    Follow Up Instructions:    I discussed the assessment and treatment plan with the patient. The patient was provided an  opportunity to ask questions and all were answered. The patient agreed with the plan and demonstrated an understanding of the instructions.   The patient was advised to call back or seek an in-person evaluation if the symptoms worsen or if the condition fails to improve as anticipated.  I provided 60 minutes of non-face-to-face time during this encounter.   Brad Mcgaughy S, LCAS

## 2018-09-10 ENCOUNTER — Other Ambulatory Visit: Payer: Self-pay

## 2018-09-10 ENCOUNTER — Ambulatory Visit (INDEPENDENT_AMBULATORY_CARE_PROVIDER_SITE_OTHER): Payer: No Typology Code available for payment source | Admitting: Licensed Clinical Social Worker

## 2018-09-10 ENCOUNTER — Encounter (HOSPITAL_COMMUNITY): Payer: Self-pay | Admitting: Licensed Clinical Social Worker

## 2018-09-10 DIAGNOSIS — F411 Generalized anxiety disorder: Secondary | ICD-10-CM

## 2018-09-10 NOTE — Progress Notes (Signed)
Virtual Visit via Video Note  I connected with Cindy Jones on 09/10/18 at  4:00 PM EDT by a video enabled telemedicine application and verified that I am speaking with the correct person using two identifiers.   I discussed the limitations of evaluation and management by telemedicine and the availability of in person appointments. The patient expressed understanding and agreed to proceed.  History of Present Illness: Pt was referred to therapy by her psychiatrist at [facility] for anxiety and panic attacks.   Participation Level: Active   Type of Therapy: Individual via Webex  Treatment Goals addressed: Improve psychiatric symptoms, Controlled Behavior, Moderated Mood, Improve Unhelpful Thought Patterns, Emotional Regulation Skills (Moderate moods, anger management, stress management), Feel and express a full Range of Emotions, Learn about Diagnosis, Healthy Coping Skills.  Interventions: CBT/Supportive/Psychoeducation  Summary: Cindy Jones is a 28 y.o. female who presents anxious and frustrated for her counseling session. Pt discussed her psychiatric symptoms and current life events. Pt and her husband are closing on their 1st house next week. Asked open ended questions and used empathic reflection. Processed the last session on communication with her husband. Discussed options for future communication with husband. Role played communication using I-statements and voice control. Pt began a conversation about having a baby. This will be an on-going conversation.     Suicidal/Homicidal: Nowithout intent/plan  Therapist Response: Assessed pt's current functioning and reviewed progress.. Assisted pt processing last session with husband, effective communication styles, closing on house,  Having a baby, problem solving.  Assisted pt processing for the management of her stressors.  Plan: See pt invidually weekly via webex  Participation Level: Active  Diagnosis: Axis I:  GAD    Follow Up  Instructions:    I discussed the assessment and treatment plan with the patient. The patient was provided an opportunity to ask questions and all were answered. The patient agreed with the plan and demonstrated an understanding of the instructions.   The patient was advised to call back or seek an in-person evaluation if the symptoms worsen or if the condition fails to improve as anticipated.  I provided 60 minutes of non-face-to-face time during this encounter.   Alika Saladin S, LCAS

## 2018-09-12 ENCOUNTER — Ambulatory Visit (INDEPENDENT_AMBULATORY_CARE_PROVIDER_SITE_OTHER): Payer: No Typology Code available for payment source | Admitting: Psychiatry

## 2018-09-12 ENCOUNTER — Encounter (HOSPITAL_COMMUNITY): Payer: Self-pay | Admitting: Psychiatry

## 2018-09-12 ENCOUNTER — Other Ambulatory Visit: Payer: Self-pay

## 2018-09-12 DIAGNOSIS — F502 Bulimia nervosa: Secondary | ICD-10-CM

## 2018-09-12 DIAGNOSIS — F988 Other specified behavioral and emotional disorders with onset usually occurring in childhood and adolescence: Secondary | ICD-10-CM

## 2018-09-12 DIAGNOSIS — Z8659 Personal history of other mental and behavioral disorders: Secondary | ICD-10-CM

## 2018-09-12 DIAGNOSIS — F952 Tourette's disorder: Secondary | ICD-10-CM

## 2018-09-12 MED ORDER — AMPHETAMINE-DEXTROAMPHETAMINE 20 MG PO TABS: 20.0000 mg | ORAL_TABLET | Freq: Every day | ORAL | 0 refills | Status: DC | PRN

## 2018-09-12 MED ORDER — AMPHETAMINE-DEXTROAMPHET ER 25 MG PO CP24: 25.0000 mg | ORAL_CAPSULE | ORAL | 0 refills | Status: DC

## 2018-09-12 MED ORDER — AMPHETAMINE-DEXTROAMPHETAMINE 20 MG PO TABS: 20.0000 mg | ORAL_TABLET | Freq: Every day | ORAL | 0 refills | Status: DC

## 2018-09-12 MED ORDER — AMPHETAMINE-DEXTROAMPHET ER 25 MG PO CP24: 25.0000 mg | ORAL_CAPSULE | Freq: Every day | ORAL | 0 refills | Status: DC

## 2018-09-12 NOTE — Progress Notes (Signed)
BH MD/PA/NP OP Progress Note  09/12/2018 7:54 AM Cindy Jones  MRN:  [id number]  Chief Complaint:  Chief Complaint    ADHD; Anxiety; Follow-up     HPI: Cindy Jones has been working from home since the 1st work of March. She finds it to be more difficult although less stressful. She is easily distracted and sometimes get distracted. She has been playing a lot on her phone. Her ADHD is worse. She is having to take her second dose of Adderall around 3pm about 4 days a week.  She is keeping her same work schedule and taking her meds at the same time. Cindy Jones states her company has been very flexible and states she will have to go back into the office at some time in the future many months away. She decided to drop her class and may not go until the spring. It has been a relief. Pt and her husband have bought a house and closing next week. At the beginning she was a little depressed because it was a change. It was on/off for 1-2 weeks dealing with boredom and quarantine. Now she is used to it and denies depression. Cindy Jones denies hopelessness. She denies SI/HI. Pt is worried for her husband who works as a Engineer, civil (consulting) at [employer] and a few friends who are in the medical field. It was in early April. She has some anxiety when she goes out for grocery. She thinks it is due to not being around people. Cindy Jones is not having panic attacks. She is not worried about becoming infected as she is using all recommended safety precautions. She was eating more at the beginning of working from home. She now prepares meals and has sets specifics meals times that is working well. Sleep is good.    Visit Diagnosis:    ICD-10-CM   1. Tourette disease F95.2   2. ADD (attention deficit disorder) without hyperactivity F98.8       Past Psychiatric History: Anxiety: Yes Bipolar Disorder: No Depression: Yes Mania: No Psychosis: No Schizophrenia: No Personality Disorder: No Hospitalization for psychiatric illness: No History of  Electroconvulsive Shock Therapy: No Prior Suicide Attempts: No   Past Medical History:  Past Medical History:  Diagnosis Date  . Anxiety   . Asthma    as a child  . Binge eating disorder   . Dysrhythmia    " I feel it about once a month"  . Frequent headaches   . GAD (generalized anxiety disorder)   . GERD (gastroesophageal reflux disease)   . Major depression in complete remission (HCC)   . Migraines   . PID (acute pelvic inflammatory disease)   . Seasonal allergies   . Tourette disorder   . Urinary tract bacterial infections     Past Surgical History:  Procedure Laterality Date  . CHOLECYSTECTOMY    . ESOPHAGOGASTRODUODENOSCOPY    . LAPAROSCOPIC CHOLECYSTECTOMY SINGLE SITE WITH INTRAOPERATIVE CHOLANGIOGRAM N/A 03/15/2016   Procedure: LAPAROSCOPIC CHOLECYSTECTOMY SINGLE SITE WITH INTRAOPERATIVE CHOLANGIOGRAM;  Surgeon: Karie Soda, MD;  Location: [facility] OR;  Service: General;  Laterality: N/A;  . TONSILLECTOMY AND ADENOIDECTOMY  2013  . WISDOM TOOTH EXTRACTION      Family Psychiatric History: Family History  Problem Relation Age of Onset  . Alcohol abuse Mother   . Bipolar disorder Mother   . Cancer Father        sarcoma; passed away when pt was 8  . Tourette syndrome Maternal Aunt   . Alcohol abuse Maternal Aunt   .  Hyperlipidemia Paternal Aunt   . Tourette syndrome Maternal Grandfather   . Heart disease Paternal Grandfather   . Alcohol abuse Maternal Uncle   . Suicidality Neg Hx     Social History:  Social History   Socioeconomic History  . Marital status: Married    Spouse name: Not on file  . Number of children: 0  . Years of education: Not on file  . Highest education level: Not on file  Occupational History  . Occupation: Systems developeranalyst  Social Needs  . Financial resource strain: Not on file  . Food insecurity:    Worry: Not on file    Inability: Not on file  . Transportation needs:    Medical: Not on file    Non-medical: Not on file  Tobacco Use  .  Smoking status: Never Smoker  . Smokeless tobacco: Never Used  Substance and Sexual Activity  . Alcohol use: Yes    Alcohol/week: 1.0 standard drinks    Types: 1 Glasses of wine per week    Comment: occ  . Drug use: No  . Sexual activity: Yes    Partners: Male    Birth control/protection: None, Condom    Comment: Married  Lifestyle  . Physical activity:    Days per week: Not on file    Minutes per session: Not on file  . Stress: Not on file  Relationships  . Social connections:    Talks on phone: Not on file    Gets together: Not on file    Attends religious service: Not on file    Active member of club or organization: Not on file    Attends meetings of clubs or organizations: Not on file    Relationship status: Not on file  Other Topics Concern  . Not on file  Social History Narrative   - Married, no children.   Psychiatrist- College education.   - She works FT as a Patent attorneyloan broker.   - Her aunt & uncle were her guardians when she was in HS. [place] in [place], [place].    - She has a fraternal twin sister.    - Wears her seatbelt, smoke detectors in the home.    Allergies:  Allergies  Allergen Reactions  . Latex Swelling and Rash  . Cranberry Juice Powder Other (See Comments)    Throat pain.  . Naproxen Other (See Comments)    "Liver starts hurting" Diarrhea  . Nickel     Skin peeling  . Sulfa Antibiotics Hives and Swelling    Metabolic Disorder Labs: Lab Results  Component Value Date   HGBA1C 5.2 06/03/2018   MPG 91 07/14/2016   No results found for: PROLACTIN Lab Results  Component Value Date   CHOL 133 06/03/2018   TRIG 52.0 06/03/2018   HDL 53.70 06/03/2018   CHOLHDL 2 06/03/2018   VLDL 10.4 06/03/2018   LDLCALC 69 06/03/2018   Lab Results  Component Value Date   TSH 1.44 07/14/2016    Therapeutic Level Labs: No results found for: LITHIUM No results found for: VALPROATE No components found for:  CBMZ  Current Medications: Current Outpatient  Medications  Medication Sig Dispense Refill  . amphetamine-dextroamphetamine (ADDERALL XR) 25 MG 24 hr capsule Take 1 capsule by mouth daily. 30 capsule 0  . amphetamine-dextroamphetamine (ADDERALL XR) 25 MG 24 hr capsule Take 1 capsule by mouth every morning. 30 capsule 0  . amphetamine-dextroamphetamine (ADDERALL XR) 25 MG 24 hr capsule Take 1 capsule by  mouth every morning. 30 capsule 0  . amphetamine-dextroamphetamine (ADDERALL) 20 MG tablet Take 1 tablet (20 mg total) by mouth daily as needed. 30 tablet 0  . MELATONIN ER PO Take by mouth.    . Omega-3 Fatty Acids (FISH OIL) 1000 MG CAPS Take 1 capsule by mouth daily.    [name] triamcinolone cream (KENALOG) 0.1 % Apply 1 application topically 2 (two) times daily. 30 g 5  . amphetamine-dextroamphetamine (ADDERALL) 20 MG tablet Take 1 tablet (20 mg total) by mouth daily as needed for up to 30 days. 30 tablet 0  . aspirin-acetaminophen-caffeine (EXCEDRIN MIGRAINE) 250-250-65 MG per tablet Take 1-2 tablets by mouth every 6 (six) hours as needed for headache.     . cetirizine (ZYRTEC) 10 MG tablet Take 1 tablet (10 mg total) by mouth daily for 30 days. 30 tablet 0  . fluticasone (FLONASE) 50 MCG/ACT nasal spray Place 2 sprays into both nostrils daily for 10 days. 16 g 0  . montelukast (SINGULAIR) 10 MG tablet Take 1 tablet (10 mg total) by mouth at bedtime for 30 days. 30 tablet 0   No current facility-administered medications for this visit.    MSE: I spoke with Cindy Jones on the phone.  She was calm, pleasant and cooperative.  She was alert and oriented x4.  She was engaged in the conversation and answered questions appropriately.  Her speech was clear and coherent with normal tone and volume.  Rate was increased as per her usual.  Mood is euthymic and affect was full.  Thought processes are goal oriented and intact.  Thought content is within normal limits.  She denies SI/HI.  She denies auditory and visual hallucinations and did not appear to be  responding to internal stimuli.  Memory and concentration are good.  Her fund of knowledge and use of language are average.  Her insight and judgment are good.  I am unable to comment on her psychomotor activity, physical appearance, hygiene, or eye contact as I was physically unable to see the patient on the phone.   Screenings: GAD-7     Office Visit from 06/03/2018 in [facility] Visit from 11/05/2017 in [facility] Office Visit from [facility]/14/2017 [facility]  Total GAD-7 Score  PHQ2-9     Office Visit from 06/03/2018 in [facility] Office Visit from 11/05/2017 in [facility] Office Visit from 02/14/2017 in [facility] Office Visit from 07/14/2016 in [facility] Office Visit from 07/04/2015 in [facility] Total Score  0  0  1  0  0  PHQ-9 Total Score  -  -       I reviewed the information below on Sep 12, 2018 and have updated it Assessment and Plan: Panic attacks;  ADHD-inattentive type; Tourette's disorder; bulimia nervosa; MDD- in remission      Medication management with supportive therapy. Risks and benefits, side effects and alternative treatment options discussed with patient. Pt was given an opportunity to ask questions about medication, illness, and treatment. All current psychiatric medications have been reviewed and discussed with the patient and adjusted as clinically appropriate. The patient has been provided an accurate and updated list of the medications being now prescribed. Patient expressed understanding of how their medications were to be  used.  Pt verbalized understanding and verbal consent obtained for treatment.   The risk of un-intended pregnancy is high based on the fact that pt reports she is not using any form of birth control. Pt is aware that these  meds carry a teratogenic risk. Pt will discuss plan of action if she does or plans to become pregnant in the future.   Status of current problems: ADHD is mildly worse   Meds: Adderall XR 25 mg p.o. every morning for ADHD.  Patient states that this medication also helps to control her Tourette's -Adderall 20 mg p.o. qD prn for ADHD in the afternoon on long work days - Reports that there is a family history of addiction therefore she does not want to take any medication with the abuse potential.  She also does not want to take anything that is sedating.  Labs:  None ordered today   Therapy: brief supportive therapy provided. Discussed psychosocial stressors in detail.    pt advised to use Calm or Headspace app to practice coping skills for anxiety   Consultations: Encouraged to follow up with therapist. Encouraged to follow up with PCP as needed   Pt denies SI and is at an acute low risk for suicide. Patient told to call clinic if any problems occur. Patient advised to go to ER if they should develop SI/HI, side effects, or if symptoms worsen. Has crisis numbers to call if needed. Pt verbalized understanding.   F/up in 3 months or sooner if needed  I provided 25 minutes of non-face-to-face time during this encounter  Oletta Darter, MD 09/12/2018, 7:54 AM

## 2018-09-17 ENCOUNTER — Ambulatory Visit (HOSPITAL_COMMUNITY): Payer: No Typology Code available for payment source | Admitting: Licensed Clinical Social Worker

## 2018-09-26 ENCOUNTER — Encounter (HOSPITAL_COMMUNITY): Payer: Self-pay | Admitting: Licensed Clinical Social Worker

## 2018-09-26 ENCOUNTER — Ambulatory Visit (INDEPENDENT_AMBULATORY_CARE_PROVIDER_SITE_OTHER): Payer: No Typology Code available for payment source | Admitting: Licensed Clinical Social Worker

## 2018-09-26 DIAGNOSIS — F988 Other specified behavioral and emotional disorders with onset usually occurring in childhood and adolescence: Secondary | ICD-10-CM

## 2018-09-26 DIAGNOSIS — F411 Generalized anxiety disorder: Secondary | ICD-10-CM | POA: Diagnosis not present

## 2018-09-26 NOTE — Progress Notes (Signed)
Virtual Visit via Video Note  I connected with Cindy Jones on 09/10/18 at  3:30 PM EDT by a video enabled telemedicine application and verified that I am speaking with the correct person using two identifiers.   I discussed the limitations of evaluation and management by telemedicine and the availability of in person appointments. The patient expressed understanding and agreed to proceed.  History of Present Illness: Pt was referred to therapy by her psychiatrist at [facility] for anxiety and panic attacks.   Participation Level: Active   Type of Therapy: Individual via Webex  Treatment Goals addressed: Improve psychiatric symptoms, Controlled Behavior, Moderated Mood, Improve Unhelpful Thought Patterns, Emotional Regulation Skills (Moderate moods, anger management, stress management), Feel and express a full Range of Emotions, Learn about Diagnosis, Healthy Coping Skills.  Interventions: CBT/Supportive/Psychoeducation  Summary: Cindy Jones is a 28 y.o. female who presents anxious and frustrated for her counseling session. Pt discussed her psychiatric symptoms and current life events. Pt and her husband  closed on their 1st house last week. Asked open ended questions and used empathic reflection. "i'm not feeling heard by my husband." Validated her feelings. Role played scenarios between pt and her husband. Coached patient on problem solving. Discussed "trying to have control" and how it affects her moods. Pt had a session with her psychiatrist who changed her adderall  Medication. After last session, pt and her husband began a discussion about having a baby. Will process that next session.   Plan: exercise equipment    Suicidal/Homicidal: Nowithout intent/plan  Therapist Response: Assessed pt's current functioning and reviewed progress.. Assisted pt processing not feeling heard, medication management, discussion about having a baby, communication and problem solving skills  Assisted pt  processing for the management of her stressors.  Plan: See pt invidually bi-weekly via webex  Participation Level: Active  Diagnosis: Axis I.:  GAD    Follow Up Instructions:    I discussed the assessment and treatment plan with the patient. The patient was provided an opportunity to ask questions and all were answered. The patient agreed with the plan and demonstrated an understanding of the instructions.   The patient was advised to call back or seek an in-person evaluation if the symptoms worsen or if the condition fails to improve as anticipated.  I provided 60 minutes of non-face-to-face time during this encounter.   Roise Emert S, LCAS

## 2018-10-03 MED FILL — ADDERALL XR 25 MG CAPSULE: 25 | 30 days supply | Qty: 30 | Fill #0

## 2018-10-03 MED FILL — AMPHETAMINE-DEXTROAMPHETAMI: 20 | 30 days supply | Qty: 30 | Fill #0

## 2018-10-10 ENCOUNTER — Other Ambulatory Visit: Payer: Self-pay

## 2018-10-10 ENCOUNTER — Ambulatory Visit (INDEPENDENT_AMBULATORY_CARE_PROVIDER_SITE_OTHER): Payer: No Typology Code available for payment source | Admitting: Licensed Clinical Social Worker

## 2018-10-10 ENCOUNTER — Encounter (HOSPITAL_COMMUNITY): Payer: Self-pay | Admitting: Licensed Clinical Social Worker

## 2018-10-10 DIAGNOSIS — F411 Generalized anxiety disorder: Secondary | ICD-10-CM | POA: Diagnosis not present

## 2018-10-10 NOTE — Progress Notes (Signed)
Virtual Visit via Video Note  I connected with Cindy Jones on 10/10/18 at  3:30 PM EDT by a video enabled telemedicine application and verified that I am speaking with the correct person using two identifiers.   I discussed the limitations of evaluation and management by telemedicine and the availability of in person appointments. The patient expressed understanding and agreed to proceed.  History of Present Illness: Pt was referred to therapy by her psychiatrist at [facility] for anxiety and panic attacks.   Participation Level: Active   Type of Therapy: Individual via Webex  Treatment Goals addressed: Improve psychiatric symptoms, Controlled Behavior, Moderated Mood, Improve Unhelpful Thought Patterns, Emotional Regulation Skills (Moderate moods, anger management, stress management), Feel and express a full Range of Emotions, Learn about Diagnosis, Healthy Coping Skills.  Interventions: CBT/Supportive/Psychoeducation  Summary: Cindy Jones is a 28 y.o. female who presents anxious for her counseling session. Pt discussed her psychiatric symptoms and current life events. Pt and her husband moved into their house over the weekend. Asked open ended questions and used empathic reflection. Pt described her moods during the move, how she kept her feelings in check. Pt took a camera and showed her house proudly. She described her feelings about home ownership, being an adult. Reviewed tx plan with pt, reviewing strategies.      Plan:     Suicidal/Homicidal: Nowithout intent/plan  Therapist Response: Assessed pt's current functioning and reviewed progress.. Assisted pt processing not feeling heard, medication management, discussion about having a baby, communication and problem solving skills  Assisted pt processing for the management of her stressors.  Plan: See pt invidually bi-weekly via webex  Participation Level: Active  Diagnosis: Axis I.:  GAD    Follow Up Instructions:    I  discussed the assessment and treatment plan with the patient. The patient was provided an opportunity to ask questions and all were answered. The patient agreed with the plan and demonstrated an understanding of the instructions.   The patient was advised to call back or seek an in-person evaluation if the symptoms worsen or if the condition fails to improve as anticipated.  I provided 60 minutes of non-face-to-face time during this encounter.   Cindy Jones S, LCAS

## 2018-10-24 ENCOUNTER — Other Ambulatory Visit: Payer: Self-pay

## 2018-10-24 ENCOUNTER — Encounter (HOSPITAL_COMMUNITY): Payer: Self-pay | Admitting: Licensed Clinical Social Worker

## 2018-10-24 ENCOUNTER — Ambulatory Visit (INDEPENDENT_AMBULATORY_CARE_PROVIDER_SITE_OTHER): Payer: No Typology Code available for payment source | Admitting: Licensed Clinical Social Worker

## 2018-10-24 DIAGNOSIS — F411 Generalized anxiety disorder: Secondary | ICD-10-CM

## 2018-10-24 NOTE — Progress Notes (Addendum)
Virtual Visit via Video Note  I connected with [name] on 10/24/18 at  3:30 PM EDT by a video enabled telemedicine application and verified that I am speaking with the correct person using two identifiers.   I discussed the limitations of evaluation and management by telemedicine and the availability of in person appointments. The patient expressed understanding and agreed to proceed.  History of Present Illness: Pt was referred to therapy by her psychiatrist at [facility] for anxiety and panic attacks.   Participation Level: Active   Type of Therapy: Individual via Webex  Treatment Goals addressed: Improve psychiatric symptoms, Controlled Behavior, Moderated Mood, Improve Unhelpful Thought Patterns, Emotional Regulation Skills (Moderate moods, anger management, stress management), Feel and express a full Range of Emotions, Learn about Diagnosis, Healthy Coping Skills.  Interventions: CBT/Supportive/Psychoeducation  Summary: Cindy Jones is a 28 y.o. female who presents anxious for her counseling session. Pt discussed her psychiatric symptoms and current life events. Asked open ended questions about her anxiety and stressors. Patient shared: work, new house, Clinical research associate, purchases. Assisted patient using CBT to focus on challenging and changing her unhelpful cognitive distortions, improving her emotional regulation, and the developing her personal coping strategies.  PLAN: Fair Electrical engineer   Suicidal/Homicidal: Nowithout intent/plan  Therapist Response: Assessed pt's current functioning and reviewed progress.. Assisted pt processing stressors, anxiety, cbt. Communication. and problem solving skills  Assisted pt processing for the management of her stressors.  Plan: See pt invidually bi-weekly via webex  Participation Level: Active  Diagnosis: Axis I.:  GAD    Follow Up Instructions:    I discussed the assessment and treatment plan with the patient. The patient was provided an  opportunity to ask questions and all were answered. The patient agreed with the plan and demonstrated an understanding of the instructions.   The patient was advised to call back or seek an in-person evaluation if the symptoms worsen or if the condition fails to improve as anticipated.  I provided 60 minutes of non-face-to-face time during this encounter.   Trenell Concannon S, LCAS

## 2018-11-07 ENCOUNTER — Encounter (HOSPITAL_COMMUNITY): Payer: Self-pay | Admitting: Licensed Clinical Social Worker

## 2018-11-07 ENCOUNTER — Other Ambulatory Visit: Payer: Self-pay

## 2018-11-07 ENCOUNTER — Ambulatory Visit (INDEPENDENT_AMBULATORY_CARE_PROVIDER_SITE_OTHER): Payer: No Typology Code available for payment source | Admitting: Licensed Clinical Social Worker

## 2018-11-07 DIAGNOSIS — F411 Generalized anxiety disorder: Secondary | ICD-10-CM | POA: Diagnosis not present

## 2018-11-07 MED FILL — ADDERALL XR 25 MG CAPSULE: 25 | 30 days supply | Qty: 30 | Fill #0

## 2018-11-07 NOTE — Progress Notes (Signed)
Virtual Visit via Video Note  I connected with Cindy Jones on 11/07/18 at  3:30 PM EDT by a video enabled telemedicine application and verified that I am speaking with the correct person using two identifiers.   I discussed the limitations of evaluation and management by telemedicine and the availability of in person appointments. The patient expressed understanding and agreed to proceed.  History of Present Illness: Pt was referred to therapy by her psychiatrist at [facility] for anxiety and panic attacks.   Participation Level: Active   Type of Therapy: Individual via Webex  Treatment Goals addressed: Improve psychiatric symptoms, Controlled Behavior, Moderated Mood, Improve Unhelpful Thought Patterns, Emotional Regulation Skills (Moderate moods, anger management, stress management), Feel and express a full Range of Emotions, Learn about Diagnosis, Healthy Coping Skills.  Interventions: CBT/Supportive/Psychoeducation  Summary: Cindy Jones is a 28 y.o. female who presents anxious for her counseling session. Pt discussed her psychiatric symptoms and current life events. Asked open ended questions about her anxiety and stressors: work, new house, communicating with husband. Used CBT concepts to assist pt Identify her troubling situations in her life, become aware of her thoughts, emotions and beliefs then identify negative or inaccurate thinking and assist to  reshape her negative or inaccurate thinking.  PLAN: Fair Electrical engineer   Suicidal/Homicidal: Nowithout intent/plan  Therapist Response: Assessed pt's current functioning and reviewed progress.. Assisted pt processing stressors, anxiety, cbt. And communication.   Assisted pt processing for the management of her stressors.  Plan: See pt invidually bi-weekly via webex  Participation Level: Active  Diagnosis: Axis I.:  GAD    Follow Up Instructions:    I discussed the assessment and treatment plan with the patient. The patient was  provided an opportunity to ask questions and all were answered. The patient agreed with the plan and demonstrated an understanding of the instructions.   The patient was advised to call back or seek an in-person evaluation if the symptoms worsen or if the condition fails to improve as anticipated.  I provided 60 minutes of non-face-to-face time during this encounter.   [name],[name], LCAS

## 2018-11-21 ENCOUNTER — Ambulatory Visit (HOSPITAL_COMMUNITY): Payer: No Typology Code available for payment source | Admitting: Licensed Clinical Social Worker

## 2018-11-21 ENCOUNTER — Other Ambulatory Visit: Payer: Self-pay

## 2018-11-27 ENCOUNTER — Other Ambulatory Visit: Payer: Self-pay

## 2018-11-27 ENCOUNTER — Ambulatory Visit (INDEPENDENT_AMBULATORY_CARE_PROVIDER_SITE_OTHER): Payer: No Typology Code available for payment source | Admitting: Licensed Clinical Social Worker

## 2018-11-27 DIAGNOSIS — F411 Generalized anxiety disorder: Secondary | ICD-10-CM

## 2018-11-28 ENCOUNTER — Encounter (HOSPITAL_COMMUNITY): Payer: Self-pay | Admitting: Licensed Clinical Social Worker

## 2018-11-28 NOTE — Progress Notes (Addendum)
Virtual Visit via Video Note  I connected with Cindy Jones on 11/28/18 at 3:30pm by a video enabled telemedicine application and verified that I am speaking with the correct person using two identifiers.   I discussed the limitations of evaluation and management by telemedicine and the availability of in person appointments. The patient expressed understanding and agreed to proceed.  History of Present Illness: Pt was referred to therapy by her psychiatrist at [facility] for anxiety and panic attacks.   Participation Level: Active   Type of Therapy: Individual via Webex  Treatment Goals addressed: Improve psychiatric symptoms, Controlled Behavior, Moderated Mood, Improve Unhelpful Thought Patterns, Emotional Regulation Skills (Moderate moods, anger management, stress management), Feel and express a full Range of Emotions, Learn about Diagnosis, Healthy Coping Skills.  Interventions: CBT/Supportive/Psychoeducation  Summary: Cindy Jones is a 28 y.o. female who presents anxious for her counseling session. Pt discussed her psychiatric symptoms and current life events. Pt reports her anxiety has increased. Several people in her life have commented on her increased anxiety. Pt discussed her stressors: work at home, virus, new house. Used CBT with pt  in alleviating both negative cognitions (i.e., thoughts, beliefs) and her maladaptive behaviors associated with anxiety. Discussed fair fighting rules and role played using them in fighting fair with her husband. Asked open ended questions about communication with her husband. Role played effective communication styles. Introduced pt to "making an appointment"when she wants to begin a discussion with her husband. Pt was open to the concept.  PLAN: Mindfulness exercises  Suicidal/Homicidal: Nowithout intent/plan  Therapist Response: Assessed pt's current functioning and reviewed progress.. Assisted pt processing stressors, anxiety, cbt.  Communication.  Styles.   Assisted pt processing for the management of her stressors.  Plan: See pt invidually bi-weekly via webex  Participation Level: Active  Diagnosis: Axis I.:  GAD    Follow Up Instructions:    I discussed the assessment and treatment plan with the patient. The patient was provided an opportunity to ask questions and all were answered. The patient agreed with the plan and demonstrated an understanding of the instructions.   The patient was advised to call back or seek an in-person evaluation if the symptoms worsen or if the condition fails to improve as anticipated.  I provided 60 minutes of non-face-to-face time during this encounter.   Captain Blucher S, LCAS

## 2018-12-05 ENCOUNTER — Encounter (HOSPITAL_COMMUNITY): Payer: Self-pay | Admitting: Psychiatry

## 2018-12-05 ENCOUNTER — Ambulatory Visit (INDEPENDENT_AMBULATORY_CARE_PROVIDER_SITE_OTHER): Payer: No Typology Code available for payment source | Admitting: Psychiatry

## 2018-12-05 ENCOUNTER — Other Ambulatory Visit: Payer: Self-pay

## 2018-12-05 DIAGNOSIS — F952 Tourette's disorder: Secondary | ICD-10-CM

## 2018-12-05 DIAGNOSIS — F502 Bulimia nervosa: Secondary | ICD-10-CM | POA: Diagnosis not present

## 2018-12-05 DIAGNOSIS — F411 Generalized anxiety disorder: Secondary | ICD-10-CM

## 2018-12-05 DIAGNOSIS — F410 Panic disorder [episodic paroxysmal anxiety] without agoraphobia: Secondary | ICD-10-CM

## 2018-12-05 DIAGNOSIS — F988 Other specified behavioral and emotional disorders with onset usually occurring in childhood and adolescence: Secondary | ICD-10-CM

## 2018-12-05 MED ORDER — AMPHETAMINE-DEXTROAMPHETAMINE 20 MG PO TABS: 20.0000 mg | ORAL_TABLET | Freq: Every day | ORAL | 0 refills | Status: DC | PRN

## 2018-12-05 MED ORDER — AMPHETAMINE-DEXTROAMPHET ER 25 MG PO CP24: 25.0000 mg | ORAL_CAPSULE | ORAL | 0 refills | Status: DC

## 2018-12-05 MED ORDER — AMPHETAMINE-DEXTROAMPHET ER 25 MG PO CP24: 25.0000 mg | ORAL_CAPSULE | Freq: Every day | ORAL | 0 refills | Status: DC

## 2018-12-05 MED FILL — AMPHETAMINE-DEXTROAMPHETAMI: 20 | 30 days supply | Qty: 30 | Fill #0

## 2018-12-05 NOTE — Progress Notes (Signed)
Virtual Visit via Telephone Note  I connected with Cindy Jones on 12/05/18 at  8:00 AM EDT by telephone and verified that I am speaking with the correct person using two identifiers.  Location: Patient: Home Provider: Office   I discussed the limitations, risks, security and privacy concerns of performing an evaluation and management service by telephone and the availability of in person appointments. I also discussed with the patient that there may be a patient responsible charge related to this service. The patient expressed understanding and agreed to proceed.   History of Present Illness: Pt reports "things are the same". She has moved and has a separate office space that helps with her ADHD. It has been a challenge to continue to work from home but she is trying things to help. Pt notes that if she has to leave the house then she must have the house clean. If the house is not clean and she forces herself to go out then she is very anxious and irritable. Her eye twitches are worse during that time. She thinks it is due to being home all the time. She misses going to work. Pt is walking her dog and going to the gym. It feels good to go out. In HS she would do this and got charged with truancy. It was socially and anxiety driven. She denies any binging episodes but does feel that she is not always in control of her food choices. Pt did check her weight a couple of weeks ago. She has gained 10 lbs but was able to rationalize it. Nysa did not obbess about it and even though she had the desire to exercise more and eat less. It is a day to day battle. Pt is leaning on her social supports and talking with her therapist. Pt denies any panic attacks.    Observations/Objective: I spoke with Cindy Jones on the phone.  Pt was calm, pleasant and cooperative.  Pt was engaged in the conversation and answered questions appropriately.  Speech was clear and coherent with normal rate, tone and volume.  Mood  is euthymic,, affect is congruent. Thought processes are coherent, goal oriented and intact.  Thought content is logical.  Pt denies SI/HI.   Pt denies auditory and visual hallucinations and did not appear to be responding to internal stimuli.  Memory and concentration are good.  Fund of knowledge and use of language are average.  Insight and judgment are fair.  I am unable to comment on psychomotor activity, general appearance, hygiene, or eye contact as I was unable to physically see the patient on the phone.  Vital signs not available since interview conducted virtually.    Assessment and Plan: ADHD- inattentive type; Panic attacks; Tourette's disorder; Bulimia Nervosa; MDD-in remission  Adderall XR 25mg  po qAM for ADHD. It helps to control her Tourettes  Adderall 20mg  po qD prn for ADHD in the afternoon for work days   Follow Up Instructions: In 6 to 8 weeks or sooner if needed   I discussed the assessment and treatment plan with the patient. The patient was provided an opportunity to ask questions and all were answered. The patient agreed with the plan and demonstrated an understanding of the instructions.   The patient was advised to call back or seek an in-person evaluation if the symptoms worsen or if the condition fails to improve as anticipated.  I provided 20 minutes of non-face-to-face time during this encounter.   Charlcie Cradle, MD

## 2018-12-06 MED FILL — ADDERALL XR 25 MG CAPSULE: 25 | 30 days supply | Qty: 30 | Fill #0

## 2018-12-11 ENCOUNTER — Encounter (HOSPITAL_COMMUNITY): Payer: Self-pay | Admitting: Licensed Clinical Social Worker

## 2018-12-11 ENCOUNTER — Ambulatory Visit (INDEPENDENT_AMBULATORY_CARE_PROVIDER_SITE_OTHER): Payer: No Typology Code available for payment source | Admitting: Licensed Clinical Social Worker

## 2018-12-11 ENCOUNTER — Other Ambulatory Visit: Payer: Self-pay

## 2018-12-11 DIAGNOSIS — F411 Generalized anxiety disorder: Secondary | ICD-10-CM

## 2018-12-11 NOTE — Progress Notes (Signed)
Virtual Visit via Video Note  I connected with Cindy Jones on 11/28/18 at 3:30pm by a video enabled telemedicine application and verified that I am speaking with the correct person using two identifiers.   I discussed the limitations of evaluation and management by telemedicine and the availability of in person appointments. The patient expressed understanding and agreed to proceed.  History of Present Illness: Pt was referred to therapy by her psychiatrist at [facility] for anxiety and panic attacks.   Participation Level: Active   Type of Therapy: Individual via Webex  Treatment Goals addressed: Improve psychiatric symptoms, Controlled Behavior, Moderated Mood, Improve Unhelpful Thought Patterns, Emotional Regulation Skills (Moderate moods, anger management, stress management), Feel and express a full Range of Emotions, Learn about Diagnosis, Healthy Coping Skills.  Interventions: CBT/Supportive/Psychoeducation  Summary: Cindy Jones is a 28 y.o. female who presents anxious for her counseling session. Pt discussed her psychiatric symptoms and current life events. Pt reports her anxious mood continues. Pt discussed her current stressors and what coping tools she's using.  Coached patient on how her actions affect her feelings and thoughts, how she perceives herself, other people and the world. Asissted pt guiding her to develop more condstructive and balanced ways of coping with her stressors. Used CBT to assist with her negative thoughts and feelings.       PLAN: Mindfulness exercises  Suicidal/Homicidal: Nowithout intent/plan  Therapist Response: Assessed pt's current functioning and reviewed progress.. Assisted pt processing stressors, anxiety, cbt.  Communication. Styles.   Assisted pt processing for the management of her stressors.  Plan: See pt invidually bi-weekly via webex  Participation Level: Active  Diagnosis: Axis I.:  GAD    Follow Up Instructions:    I discussed the  assessment and treatment plan with the patient. The patient was provided an opportunity to ask questions and all were answered. The patient agreed with the plan and demonstrated an understanding of the instructions.   The patient was advised to call back or seek an in-person evaluation if the symptoms worsen or if the condition fails to improve as anticipated.  I provided 60 minutes of non-face-to-face time during this encounter.   , S, LCAS

## 2018-12-25 ENCOUNTER — Encounter (HOSPITAL_COMMUNITY): Payer: Self-pay | Admitting: Licensed Clinical Social Worker

## 2018-12-25 ENCOUNTER — Ambulatory Visit (INDEPENDENT_AMBULATORY_CARE_PROVIDER_SITE_OTHER): Payer: No Typology Code available for payment source | Admitting: Licensed Clinical Social Worker

## 2018-12-25 ENCOUNTER — Other Ambulatory Visit: Payer: Self-pay

## 2018-12-25 DIAGNOSIS — F411 Generalized anxiety disorder: Secondary | ICD-10-CM | POA: Diagnosis not present

## 2018-12-25 NOTE — Progress Notes (Signed)
Virtual Visit via Video Note  I connected with Cindy Jones on 11/28/18 at 3:30pm by a video enabled telemedicine application and verified that I am speaking with the correct person using two identifiers.   I discussed the limitations of evaluation and management by telemedicine and the availability of in person appointments. The patient expressed understanding and agreed to proceed.  History of Present Illness: Pt was referred to therapy by her psychiatrist at [facility] for anxiety and panic attacks.   Participation Level: Active   Type of Therapy: Individual via Webex  Treatment Goals addressed: Improve psychiatric symptoms, Controlled Behavior, Moderated Mood, Improve Unhelpful Thought Patterns, Emotional Regulation Skills (Moderate moods, anger management, stress management), Feel and express a full Range of Emotions, Learn about Diagnosis, Healthy Coping Skills.  Interventions: CBT/Supportive/Psychoeducation  Summary: Cindy Jones is a 28 y.o. female who presents anxious for her counseling session. Pt discussed her psychiatric symptoms and current life events. Pt reports her anxious mood continues. Pt discussed her current stressors and what coping tools she's using.  Pt had a work issue. Coached pt on accountability and life experiences. Educated pt on tone of voice in effective communicating. Pt has been asked by her husband's new therapist to join the next session. Processed scenarios with pt. Validated pt's feelings.  PLAN:eating disorder therapy    Suicidal/Homicidal: Nowithout intent/plan  Therapist Response: Assessed pt's current functioning and reviewed progress.. Assisted pt processing stressors, anxiety, cbt.  Communication. Styles.   Assisted pt processing for the management of her stressors.  Plan: See pt invidually bi-weekly via webex  Participation Level: Active  Diagnosis: Axis I.:  GAD    Follow Up Instructions:    I discussed the assessment and treatment plan  with the patient. The patient was provided an opportunity to ask questions and all were answered. The patient agreed with the plan and demonstrated an understanding of the instructions.   The patient was advised to call back or seek an in-person evaluation if the symptoms worsen or if the condition fails to improve as anticipated.  I provided 60 minutes of non-face-to-face time during this encounter.   [name],[name], LCAS

## 2019-01-08 MED FILL — AMPHETAMINE-DEXTROAMPHETAMI: 20 | 30 days supply | Qty: 30 | Fill #0

## 2019-01-08 MED FILL — ADDERALL XR 25 MG CAPSULE: 25 | 30 days supply | Qty: 30 | Fill #0

## 2019-01-15 ENCOUNTER — Encounter (HOSPITAL_COMMUNITY): Payer: Self-pay | Admitting: Licensed Clinical Social Worker

## 2019-01-15 ENCOUNTER — Other Ambulatory Visit: Payer: Self-pay

## 2019-01-15 ENCOUNTER — Ambulatory Visit (INDEPENDENT_AMBULATORY_CARE_PROVIDER_SITE_OTHER): Payer: No Typology Code available for payment source | Admitting: Licensed Clinical Social Worker

## 2019-01-15 DIAGNOSIS — F411 Generalized anxiety disorder: Secondary | ICD-10-CM

## 2019-01-15 NOTE — Progress Notes (Signed)
Virtual Visit via Video Note  I connected with Cindy Jones on 01/15/19 at 3:00pm by a video enabled telemedicine application and verified that I am speaking with the correct person using two identifiers.   I discussed the limitations of evaluation and management by telemedicine and the availability of in person appointments. The patient expressed understanding and agreed to proceed.  History of Present Illness: Pt was referred to therapy by her psychiatrist at [facility] for anxiety and panic attacks.   Participation Level: Active   Type of Therapy: Individual via Webex  Treatment Goals addressed: Improve psychiatric symptoms, Controlled Behavior, Moderated Mood, Improve Unhelpful Thought Patterns, Emotional Regulation Skills (Moderate moods, anger management, stress management), Feel and express a full Range of Emotions, Learn about Diagnosis, Healthy Coping Skills.  Interventions: CBT/Supportive/Psychoeducation  Summary: Cindy Jones is a 28 y.o. female who presents anxious for her counseling session. Pt discussed her psychiatric symptoms and current life events. Pt reports her anxious mood continues. Pt discussed her current stressors and what coping tools she'Jones using. Pt attended a therapy session with her husband, which proved beneficial. Counselor provided psychoeducation on effective communication skills. Pt discussed her eating disorder. Emailed "Marketing executive." Reviewed workbook with patient.    PLAN: workbook, 21 days of gratitude (jar), grad school, seeing mother (safety)   Suicidal/Homicidal: Nowithout intent/plan  Therapist Response: Assessed [name] current functioning and reviewed progress.. Assisted pt processing stressors, anxiety, building body acceptance.  Communication. Styles.   Assisted pt processing for the management of her stressors.  Plan: See pt invidually bi-weekly via webex  Participation Level: Active  Diagnosis: Axis I.:  GAD    Follow Up  Instructions:    I discussed the assessment and treatment plan with the patient. The patient was provided an opportunity to ask questions and all were answered. The patient agreed with the plan and demonstrated an understanding of the instructions.   The patient was advised to call back or seek an in-person evaluation if the symptoms worsen or if the condition fails to improve as anticipated.  I provided 60 minutes of non-face-to-face time during this encounter.   Cindy Jones, LCAS

## 2019-01-22 ENCOUNTER — Other Ambulatory Visit: Payer: Self-pay

## 2019-01-22 ENCOUNTER — Ambulatory Visit (INDEPENDENT_AMBULATORY_CARE_PROVIDER_SITE_OTHER): Payer: No Typology Code available for payment source | Admitting: Licensed Clinical Social Worker

## 2019-01-22 DIAGNOSIS — F411 Generalized anxiety disorder: Secondary | ICD-10-CM

## 2019-01-22 DIAGNOSIS — F988 Other specified behavioral and emotional disorders with onset usually occurring in childhood and adolescence: Secondary | ICD-10-CM

## 2019-01-22 NOTE — Progress Notes (Signed)
Virtual Visit via Video Note  I connected with Cindy Jones on 01/22/19 at 3:00pm by a video enabled telemedicine application and verified that I am speaking with the correct person using two identifiers.   I discussed the limitations of evaluation and management by telemedicine and the availability of in person appointments. The patient expressed understanding and agreed to proceed.  History of Present Illness: Pt was referred to therapy by her psychiatrist at [facility] for anxiety and panic attacks.   Participation Level: Active   Type of Therapy: Individual via Webex  Treatment Goals addressed: Improve psychiatric symptoms, Controlled Behavior, Moderated Mood, Improve Unhelpful Thought Patterns, Emotional Regulation Skills (Moderate moods, anger management, stress management), Feel and express a full Range of Emotions, Learn about Diagnosis, Healthy Coping Skills.  Interventions: CBT/Supportive/Psychoeducation  Summary: Cindy Jones is a 28 y.o. female who presents anxious for her counseling session. Pt discussed her psychiatric symptoms and current life events. Pt reports her anxious mood continues. Pt discussed her current stressors and what coping tools she'Jones using. Pt attended a therapy session with her husband, which proved beneficial. Counselor provided psychoeducation on effective communication skills. Pt discussed her eating disorder. Emailed "Marketing executive." Reviewed workbook with patient.    PLAN: workbook, 21 days of gratitude (jar), grad school, seeing mother (safety)   Suicidal/Homicidal: Nowithout intent/plan  Therapist Response: Assessed [name] current functioning and reviewed progress.. Assisted pt processing stressors, anxiety, building body acceptance.  Communication. Styles.   Assisted pt processing for the management of her stressors.  Plan: See pt invidually bi-weekly via webex  Participation Level: Active  Diagnosis: Axis I.:  GAD    Follow Up  Instructions:    I discussed the assessment and treatment plan with the patient. The patient was provided an opportunity to ask questions and all were answered. The patient agreed with the plan and demonstrated an understanding of the instructions.   The patient was advised to call back or seek an in-person evaluation if the symptoms worsen or if the condition fails to improve as anticipated.  I provided 60 minutes of non-face-to-face time during this encounter.   Cindy Jones, LCAS

## 2019-01-23 ENCOUNTER — Other Ambulatory Visit: Payer: Self-pay

## 2019-01-23 ENCOUNTER — Ambulatory Visit (INDEPENDENT_AMBULATORY_CARE_PROVIDER_SITE_OTHER): Payer: No Typology Code available for payment source | Admitting: Psychiatry

## 2019-01-23 ENCOUNTER — Encounter (HOSPITAL_COMMUNITY): Payer: Self-pay | Admitting: Psychiatry

## 2019-01-23 DIAGNOSIS — F988 Other specified behavioral and emotional disorders with onset usually occurring in childhood and adolescence: Secondary | ICD-10-CM

## 2019-01-23 MED ORDER — AMPHETAMINE-DEXTROAMPHETAMINE 20 MG PO TABS: 20.0000 mg | ORAL_TABLET | Freq: Every day | ORAL | 0 refills | Status: DC | PRN

## 2019-01-23 MED ORDER — AMPHETAMINE-DEXTROAMPHET ER 25 MG PO CP24: 25.0000 mg | ORAL_CAPSULE | ORAL | 0 refills | Status: DC

## 2019-01-23 MED ORDER — AMPHETAMINE-DEXTROAMPHET ER 25 MG PO CP24: 25.0000 mg | ORAL_CAPSULE | Freq: Every day | ORAL | 0 refills | Status: DC

## 2019-01-23 MED ORDER — AMPHETAMINE-DEXTROAMPHETAMINE 20 MG PO TABS: 20.0000 mg | ORAL_TABLET | Freq: Every day | ORAL | 0 refills | Status: DC

## 2019-01-23 NOTE — Progress Notes (Signed)
  Virtual Visit via Telephone Note  I connected with Cindy Jones  on 01/23/19 at 11:30 AM EDT by telephone and verified that I am speaking with the correct person using two identifiers.  Location: Patient: home Provider: office   I discussed the limitations, risks, security and privacy concerns of performing an evaluation and management service by telephone and the availability of in person appointments. I also discussed with the patient that there may be a patient responsible charge related to this service. The patient expressed understanding and agreed to proceed.   History of Present Illness: "I have been doing better". She hasn't picked up the prescription because she didn't want to take anti- anxiety meds daily. She has been utilizing her social supports and working out and has  adjusted her daily routine. She is looking forward to the holidays. All this helps with her anxiety. Pt takes Adderall daily and it is controlling her ADHD and Tourettes. She notes taking the short acting earlier in the day helps. She has rare stress induced panic attacks. She is eating well and going the gym helps. Sleep is good. She denies depression. She denies SI/HI. Pt is applying for grad schools right now.     Observations/Objective: I spoke with Cindy Jones on the phone.  Pt was calm, pleasant and cooperative.  Pt was engaged in the conversation and answered questions appropriately.  Speech was clear and coherent with normal rate, tone and volume.  Mood is euthymic and affect is full. Thought processes are coherent, goal oriented and intact.  Thought content is logical.  Pt denies SI/HI.   Pt denies auditory and visual hallucinations and did not appear to be responding to internal stimuli.  Memory and concentration are good.  Fund of knowledge and use of language are average.  Insight and judgment are fair.  I am unable to comment on psychomotor activity, general appearance, hygiene, or eye contact as I  was unable to physically see the patient on the phone.  Vital signs not available since interview conducted virtually.     Assessment and Plan: ADHD-inattentive type; Tourette's d/o; Panic attacks; Bulimia Nervosa; MDD- in remission  Status of current symptoms: stable   Adderall XR 25mg  po qAM for ADHD.  Adderall 20mg  po qD prn ADHD for late afternoon for work days  Encouraged to continue therapy weekly  Follow Up Instructions: In 12 weeks or sooner if needed   I discussed the assessment and treatment plan with the patient. The patient was provided an opportunity to ask questions and all were answered. The patient agreed with the plan and demonstrated an understanding of the instructions.   The patient was advised to call back or seek an in-person evaluation if the symptoms worsen or if the condition fails to improve as anticipated.  I provided 20 minutes of non-face-to-face time during this encounter.   Charlcie Cradle, MD

## 2019-01-27 ENCOUNTER — Encounter (HOSPITAL_COMMUNITY): Payer: Self-pay | Admitting: Licensed Clinical Social Worker

## 2019-01-29 ENCOUNTER — Other Ambulatory Visit: Payer: Self-pay

## 2019-01-29 ENCOUNTER — Encounter (HOSPITAL_COMMUNITY): Payer: Self-pay | Admitting: Licensed Clinical Social Worker

## 2019-01-29 ENCOUNTER — Ambulatory Visit (INDEPENDENT_AMBULATORY_CARE_PROVIDER_SITE_OTHER): Payer: No Typology Code available for payment source | Admitting: Licensed Clinical Social Worker

## 2019-01-29 DIAGNOSIS — F411 Generalized anxiety disorder: Secondary | ICD-10-CM

## 2019-01-29 NOTE — Progress Notes (Signed)
Virtual Visit via Video Note  I connected with Cindy Jones on 01/29/19 at 3:00pm by a video enabled telemedicine application and verified that I am speaking with the correct person using two identifiers.   I discussed the limitations of evaluation and management by telemedicine and the availability of in person appointments. The patient expressed understanding and agreed to proceed.  History of Present Illness: Pt was referred to therapy by her psychiatrist at [facility] for anxiety and panic attacks.   Participation Level: Active   Type of Therapy: Individual via Webex  Treatment Goals addressed: Improve psychiatric symptoms, Controlled Behavior, Moderated Mood, Improve Unhelpful Thought Patterns, Emotional Regulation Skills (Moderate moods, anger management, stress management), Feel and express a full Range of Emotions, Learn about Diagnosis, Healthy Coping Skills.  Interventions: CBT/Supportive/Psychoeducation  Summary: Cindy Jones is a 28 y.o. female who presents anxious for her counseling session. Pt discussed her psychiatric symptoms and current life events. Pt reports her anxious mood continues. Pt discussed her current stressors and what coping tools she's using. Pt reports she and her husband had a disagreement and haven't been communicating the last 2 days. Used Socratic questions. Assisted patient  identifying her part in the disagreement, her lack of effective communication, her desire to control the situation without input from her husband, her decision making without problem solving. Provided psychoeducation on effective communication skills, decision making, problem solving, efforts to control situations.   "Building body acceptance." Reviewed workbook with patient.    PLAN: workbook, 21 days of gratitude (jar), grad school, seeing mother (safety)   Suicidal/Homicidal: Nowithout intent/plan  Therapist Response: Assessed pt's current functioning and reviewed progress.. Assisted  pt processing stressors, anxiety, building body acceptance.  Communication. Styles.   Assisted pt processing for the management of her stressors.  Plan: See pt invidually bi-weekly via webex  Participation Level: Active  Diagnosis: Axis I.:  GAD    Follow Up Instructions:    I discussed the assessment and treatment plan with the patient. The patient was provided an opportunity to ask questions and all were answered. The patient agreed with the plan and demonstrated an understanding of the instructions.   The patient was advised to call back or seek an in-person evaluation if the symptoms worsen or if the condition fails to improve as anticipated.  I provided 60 minutes of non-face-to-face time during this encounter.   Liese Dizdarevic S, LCAS

## 2019-02-05 ENCOUNTER — Ambulatory Visit (INDEPENDENT_AMBULATORY_CARE_PROVIDER_SITE_OTHER): Payer: No Typology Code available for payment source | Admitting: Licensed Clinical Social Worker

## 2019-02-05 ENCOUNTER — Other Ambulatory Visit: Payer: Self-pay

## 2019-02-05 ENCOUNTER — Encounter (HOSPITAL_COMMUNITY): Payer: Self-pay | Admitting: Licensed Clinical Social Worker

## 2019-02-05 DIAGNOSIS — F411 Generalized anxiety disorder: Secondary | ICD-10-CM | POA: Diagnosis not present

## 2019-02-05 NOTE — Progress Notes (Signed)
Comprehensive Clinical Assessment (CCA) Note  02/05/2019 Cindy Jones [id number]  Visit Diagnosis:      ICD-10-CM   1. Generalized anxiety disorder  F41.1       CCA Part One  Part One has been completed on paper by the patient.  (See scanned document in Chart Review)  CCA Part Two A  Intake/Chief Complaint:  CCA Intake With Chief Complaint CCA Part Two Date: 02/05/19 CCA Part Two Time: 1305 Chief Complaint/Presenting Problem: Patient continues to have anxiety symptoms that can become unmanageable Patients Currently Reported Symptoms/Problems: feeling overwhelmed, anxious, Collateral Involvement: psychiatry notes Individual's Strengths: motivated, previous therapy Individual's Preferences: prefers to continue to work on her mental wellness Individual's Abilities: ability to work on her mental wellness Type of Services Patient Feels Are Needed: op services  Mental Health Symptoms Depression:     Mania:     Anxiety:   Anxiety: Difficulty concentrating, Irritability, Restlessness, Tension, Worrying  Psychosis:     Trauma:  Trauma: Avoids reminders of event(father passed away when i was 12, i was raised in an alcoholic family)  Obsessions:     Compulsions:     Inattention:     Hyperactivity/Impulsivity:  Hyperactivity/Impulsivity: Blurts out answers, Difficulty waiting turn  Oppositional/Defiant Behaviors:     Borderline Personality:     Other Mood/Personality Symptoms:      Mental Status Exam Appearance and self-care  Stature:  Stature: Average  Weight:  Weight: Average weight  Clothing:  Clothing: Casual  Grooming:  Grooming: Normal  Cosmetic use:  Cosmetic Use: None  Posture/gait:  Posture/Gait: Normal  Motor activity:  Motor Activity: Not Remarkable  Sensorium  Attention:  Attention: Normal  Concentration:  Concentration: Normal  Orientation:  Orientation: X5  Recall/memory:  Recall/Memory: Normal  Affect and Mood  Affect:  Affect: Anxious  Mood:  Mood:  Anxious  Relating  Eye contact:  Eye Contact: Normal  Facial expression:  Facial Expression: Anxious  Attitude toward examiner:  Attitude Toward Examiner: Cooperative  Thought and Language  Speech flow: Speech Flow: Normal  Thought content:  Thought Content: Appropriate to mood and circumstances  Preoccupation:     Hallucinations:     Organization:     Company secretaryxecutive Functions  Fund of Knowledge:  Fund of Knowledge: Average  Intelligence:  Intelligence: Above Average  Abstraction:  Abstraction: Normal  Judgement:  Judgement: Normal  Reality Testing:  Reality Testing: Realistic  Insight:  Insight: Good  Decision Making:  Decision Making: Normal  Social Functioning  Social Maturity:  Social Maturity: Responsible  Social Judgement:  Social Judgement: Normal  Stress  Stressors:  Stressors: Work, Family conflict  Coping Ability:  Coping Ability: Building surveyorverwhelmed  Skill Deficits:     Supports:      Family and Psychosocial History: Family history Marital status: Married Number of Years Married: 3 What types of issues is patient dealing with in the relationship?: communication styles, patient's eating disorder, patient's anxiety Does patient have children?: No  Childhood History:  Childhood History By whom was/is the patient raised?: Both parents Additional childhood history information: father died when i was [age], mother was a drug addict and i didn't know it. i was shipped to boarding school. I was never parented, i raised myself, i have a twin sister Description of patient's relationship with caregiver when they were a child: good with father until he died, not good with my mother, she didn't protect me Patient's description of current relationship with people who raised him/her: working on my realtionship  with my mother How were you disciplined when you got in trouble as a child/adolescent?: didn't really get disciplined, no one paid attention Does patient have siblings?: Yes Number of  Siblings: 1 Description of patient's current relationship with siblings: it's an on/off again relationship. I'm always bailing her out and I resent that Did patient suffer any verbal/emotional/physical/sexual abuse as a child?: No Did patient suffer from severe childhood neglect?: Yes Patient description of severe childhood neglect: nobody parented it Has patient ever been sexually abused/assaulted/raped as an adolescent or adult?: No Was the patient ever a victim of a crime or a disaster?: No Witnessed domestic violence?: No  CCA Part Two B  Employment/Work Situation: Employment / Work Psychologist, occupational Employment situation: Employed Where is patient currently employed?: [employer] How long has patient been employed?: 4 years Patient's job has been impacted by current illness: Yes Describe how patient's job has been impacted: getting overwhelmed at work What is the longest time patient has a held a job?: 4 years Where was the patient employed at that time?: this job Did You Receive Any Psychiatric Treatment/Services While in Equities trader?: No Are There Guns or Other Weapons in Your Home?: No  Education: Education School Currently Attending: [employer] Last Grade Completed: 16 Did You Graduate From [employer]?: Yes Did You Attend College?: Yes What Type of College Degree Do you Have?: BA Business Did You Attend Graduate School?: Yes What is Your Post Graduate Degree?: Im in graduate school at [employer]  Did You Have An Individualized Education Program (IIEP): No Did You Have Any Difficulty At [employer]?: Yes Were Any Medications Ever Prescribed For These Difficulties?: No  Religion: Religion/Spirituality Are You A Religious Person?: No  Leisure/Recreation: Leisure / Recreation Leisure and Hobbies: play with my dog, be with my husband  Exercise/Diet: Exercise/Diet Do You Exercise?: Yes What Type of Exercise Do You Do?: Other (Comment) How Many Times a  Week Do You Exercise?: 1-3 times a week Have You Gained or Lost A Significant Amount of Weight in the Past Six Months?: No Do You Follow a Special Diet?: Yes Type of Diet: ive been on/off Keto Do You Have Any Trouble Sleeping?: No  CCA Part Two C  Alcohol/Drug Use: Alcohol / Drug Use History of alcohol / drug use?: No history of alcohol / drug abuse                      CCA Part Three  ASAM's:  Six Dimensions of Multidimensional Assessment  Dimension 1:  Acute Intoxication and/or Withdrawal Potential:     Dimension 2:  Biomedical Conditions and Complications:     Dimension 3:  Emotional, Behavioral, or Cognitive Conditions and Complications:     Dimension 4:  Readiness to Change:     Dimension 5:  Relapse, Continued use, or Continued Problem Potential:     Dimension 6:  Recovery/Living Environment:      Substance use Disorder (SUD)    Social Function:  Social Functioning Social Maturity: Responsible Social Judgement: Normal  Stress:  Stress Stressors: Work, Family conflict Coping Ability: Overwhelmed Patient Takes Medications The Way The Doctor Instructed?: Yes Priority Risk: Low Acuity  Risk Assessment- Self-Harm Potential: Risk Assessment For Self-Harm Potential Thoughts of Self-Harm: No current thoughts Method: No plan Availability of Means: No access/NA  Risk Assessment -Dangerous to Others Potential: Risk Assessment For Dangerous to Others Potential Method: No Plan Availability of Means: No access or NA Intent: Vague intent or  NA Notification Required: No need or identified person  DSM5 Diagnoses: Patient Active Problem List   Diagnosis Date Noted  . GAD (generalized anxiety disorder) 11/07/2018  . Overweight (BMI 25.0-29.9) 06/03/2018  . Bulimia 04/20/2015  . ADD (attention deficit disorder) 01/15/2014  . Tourette disease 10/18/2013    Patient Centered Plan: Patient is on the following Treatment Plan(s):  anxiety   Recommendations for  Services/Supports/Treatments: Recommendations for Services/Supports/Treatments Recommendations For Services/Supports/Treatments: Individual Therapy, Medication Management  Treatment Plan Summary:    Referrals to Alternative Service(s): Referred to Alternative Service(s):   Place:   Date:   Time:    Referred to Alternative Service(s):   Place:   Date:   Time:    Referred to Alternative Service(s):   Place:   Date:   Time:    Referred to Alternative Service(s):   Place:   Date:   Time:     Jenkins Rouge

## 2019-02-06 MED FILL — ADDERALL XR 25 MG CAPSULE: 25 | 30 days supply | Qty: 30 | Fill #0

## 2019-02-12 ENCOUNTER — Ambulatory Visit (INDEPENDENT_AMBULATORY_CARE_PROVIDER_SITE_OTHER): Payer: No Typology Code available for payment source | Admitting: Licensed Clinical Social Worker

## 2019-02-12 ENCOUNTER — Encounter (HOSPITAL_COMMUNITY): Payer: Self-pay | Admitting: Licensed Clinical Social Worker

## 2019-02-12 ENCOUNTER — Other Ambulatory Visit: Payer: Self-pay

## 2019-02-12 DIAGNOSIS — F411 Generalized anxiety disorder: Secondary | ICD-10-CM | POA: Diagnosis not present

## 2019-02-12 NOTE — Progress Notes (Signed)
Virtual Visit via Video Note  I connected with Cindy Jones on 02/12/19 at 1:00pm by a video enabled telemedicine application and verified that I am speaking with the correct person using two identifiers.   I discussed the limitations of evaluation and management by telemedicine and the availability of in person appointments. The patient expressed understanding and agreed to proceed.  History of Present Illness: Pt was referred to therapy by her psychiatrist at [facility] for anxiety and panic attacks.   Participation Level: Active   Type of Therapy: Individual via Webex  Treatment Goals addressed: Improve psychiatric symptoms, Controlled Behavior, Moderated Mood, Improve Unhelpful Thought Patterns, Emotional Regulation Skills (Moderate moods, anger management, stress management), Feel and express a full Range of Emotions, Learn about Diagnosis, Healthy Coping Skills.  Interventions: CBT/Supportive/Psychoeducation  Summary: Cindy Jones is a 28 y.o. female who presents WNL for her counseling session. Pt discussed her psychiatric symptoms and current life events. "I'm more tired than anything today. I was in a wedding with my husband over the weekend." Pt described her emotions throughout the wedding event. Helped pt process her emotions. She and her husband have had some communication difficulties within the last few days. Role played effective communication skills (listening, not interrupting, not controlling.)   PLAN: Rewiring your brain book. "Building body acceptance." Reviewed workbook with patient.    PLAN: workbook, 21 days of gratitude (jar), grad school, seeing mother (safety)   Suicidal/Homicidal: Nowithout intent/plan  Therapist Response: Assessed pt's current functioning and reviewed progress.. Assisted pt processing stressors, anxiety, building body acceptance.  Communication. Styles.   Assisted pt processing for the management of her stressors.  Plan: See pt invidually weekly  via webex  Participation Level: Active  Diagnosis: Axis I.:  GAD    Follow Up Instructions:    I discussed the assessment and treatment plan with the patient. The patient was provided an opportunity to ask questions and all were answered. The patient agreed with the plan and demonstrated an understanding of the instructions.   The patient was advised to call back or seek an in-person evaluation if the symptoms worsen or if the condition fails to improve as anticipated.  I provided 60 minutes of non-face-to-face time during this encounter.   Deeandra Jerry S, LCAS

## 2019-02-19 ENCOUNTER — Encounter (HOSPITAL_COMMUNITY): Payer: Self-pay | Admitting: Licensed Clinical Social Worker

## 2019-02-19 ENCOUNTER — Other Ambulatory Visit: Payer: Self-pay

## 2019-02-19 ENCOUNTER — Ambulatory Visit (INDEPENDENT_AMBULATORY_CARE_PROVIDER_SITE_OTHER): Payer: No Typology Code available for payment source | Admitting: Licensed Clinical Social Worker

## 2019-02-19 DIAGNOSIS — F411 Generalized anxiety disorder: Secondary | ICD-10-CM | POA: Diagnosis not present

## 2019-02-19 NOTE — Progress Notes (Signed)
Virtual Visit via Video Note  I connected with Cindy Jones on 02/12/19 at 1:00pm by a video enabled telemedicine application and verified that I am speaking with the correct person using two identifiers.   I discussed the limitations of evaluation and management by telemedicine and the availability of in person appointments. The patient expressed understanding and agreed to proceed.  History of Present Illness: Pt was referred to therapy by her psychiatrist at [facility] for anxiety and panic attacks.   Participation Level: Active   Type of Therapy: Individual via Webex  Treatment Goals addressed: Improve psychiatric symptoms, Controlled Behavior, Moderated Mood, Improve Unhelpful Thought Patterns, Emotional Regulation Skills (Moderate moods, anger management, stress management), Feel and express a full Range of Emotions, Learn about Diagnosis, Healthy Coping Skills.  Interventions: CBT/Supportive/Psychoeducation  Summary: Cindy Jones is a 28 y.o. female who presents anxious for her counseling session. Pt discussed her psychiatric symptoms and current life events. Patient began the session describing her relationship with her mother. "My mother wants to see me. I'm not comfortable seeing her during the pandemic." Used socratic questions. Discussed alternatives to in-person visits. Patient discussed the communication styles between her husband. Her husband joined the webex session where they role played their communication style. Coached pt on anger as a secondary emotion and vulnerability. Suggested pt watch [place] on you tube.  PLAN: Rewiring your brain book. "Building body acceptance." Reviewed workbook with patient.    PLAN: workbook, 21 days of gratitude (jar), grad school, seeing mother (safety)   Suicidal/Homicidal: Nowithout intent/plan  Therapist Response: Assessed pt's current functioning and reviewed progress.. Assisted pt processing stressors, anxiety,  building body acceptance.  Communication. Styles.   Assisted pt processing for the management of her stressors.  Plan: See pt invidually weekly via webex  Participation Level: Active  Diagnosis: Axis I.:  GAD    Follow Up Instructions:    I discussed the assessment and treatment plan with the patient. The patient was provided an opportunity to ask questions and all were answered. The patient agreed with the plan and demonstrated an understanding of the instructions.   The patient was advised to call back or seek an in-person evaluation if the symptoms worsen or if the condition fails to improve as anticipated.  I provided 60 minutes of non-face-to-face time during this encounter.   , S, LCAS

## 2019-02-24 ENCOUNTER — Telehealth (HOSPITAL_COMMUNITY): Payer: Self-pay | Admitting: Urgent Care

## 2019-02-24 MED ORDER — ALBUTEROL SULFATE HFA 108 (90 BASE) MCG/ACT IN AERS: 1.0000 | INHALATION_SPRAY | Freq: Four times a day (QID) | RESPIRATORY_TRACT | 0 refills | Status: DC | PRN

## 2019-02-24 NOTE — Telephone Encounter (Signed)
Patient called and reported having had chest tightness and pressure when she has been exercising the past few days. She has a hx of childhood asthma, has current allergic rhinitis. Has previously had to use albuterol inhaler but hasn't had one in many years. Denies fever, chest pain, shob. Denies any direct known COVID 19 contacts. Will send script to patient's pharmacy for albuterol inhaler. Counseled patient on potential for adverse effects with medications prescribed/recommended today, ER and return-to-clinic precautions discussed, patient verbalized understanding.

## 2019-02-25 ENCOUNTER — Telehealth (HOSPITAL_COMMUNITY): Payer: Self-pay | Admitting: Urgent Care

## 2019-02-25 MED FILL — ALBUTEROL SULFATE HFA 108 (: 108 (90 BAS | 25 days supply | Qty: 18 | Fill #0

## 2019-02-25 NOTE — Telephone Encounter (Signed)
Patient called and needed clarification regarding her sulfa allergy. Per UpToDate sulfonamide antibiotics are unrelated to sulfates and in this case, she should not react to albuterol sulfate. Will inform patient. Counseled patient on potential for adverse effects with medications prescribed/recommended today, ER and return-to-clinic precautions discussed, patient verbalized understanding.

## 2019-02-26 ENCOUNTER — Other Ambulatory Visit: Payer: Self-pay

## 2019-02-26 ENCOUNTER — Encounter (HOSPITAL_COMMUNITY): Payer: Self-pay | Admitting: Licensed Clinical Social Worker

## 2019-02-26 ENCOUNTER — Ambulatory Visit (INDEPENDENT_AMBULATORY_CARE_PROVIDER_SITE_OTHER): Payer: No Typology Code available for payment source | Admitting: Licensed Clinical Social Worker

## 2019-02-26 DIAGNOSIS — F411 Generalized anxiety disorder: Secondary | ICD-10-CM | POA: Diagnosis not present

## 2019-02-26 NOTE — Progress Notes (Signed)
Virtual Visit via Video Note  I connected with Cindy Jones on 02/19/19 at 1:00pm by a video enabled telemedicine application and verified that I am speaking with the correct person using two identifiers.   I discussed the limitations of evaluation and management by telemedicine and the availability of in person appointments. The patient expressed understanding and agreed to proceed.  History of Present Illness: Pt was referred to therapy by her psychiatrist at [facility] for anxiety and panic attacks.   Participation Level: Active   Type of Therapy: Individual via Webex  Treatment Goals addressed: Improve psychiatric symptoms, Controlled Behavior, Moderated Mood, Improve Unhelpful Thought Patterns, Emotional Regulation Skills (Moderate moods, anger management, stress management), Feel and express a full Range of Emotions, Learn about Diagnosis, Healthy Coping Skills.  Interventions: CBT/Supportive/Psychoeducation  Summary: Cindy Jones is a 28 y.o. female who presents anxious for her counseling session. Pt discussed her psychiatric symptoms and current life events. Patient discussed a disagreement between she and her husband about financial goals. Assisted patient with identifying what short term and long-term financial goals are. Suggested patient discuss these financial goals identified using effective communication styles with her husband. Began review of 21 days of gratitude journal. Will continue at next session.     PLAN: [place] Vulnerability on you tube.  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 Assisted pt processing stressors, anxiety, building body acceptance.  Communication. Styles.   Assisted pt processing for the management of her  stressors.  Plan: See pt invidually weekly via webex  Participation Level: Active  Diagnosis: Axis I.:  GAD    Follow Up Instructions:    I discussed the assessment and treatment plan with the patient. The patient was provided an opportunity to ask questions and all were answered. The patient agreed with the plan and demonstrated an understanding of the instructions.   The patient was advised to call back or seek an in-person evaluation if the symptoms worsen or if the condition fails to improve as anticipated.  I provided 60 minutes of non-face-to-face time during this encounter.   Valynn Schamberger S, LCAS

## 2019-02-27 ENCOUNTER — Encounter: Payer: Self-pay | Admitting: Family Medicine

## 2019-02-27 NOTE — Telephone Encounter (Signed)
Patient's husband called back to confirm that the [facility] referral has been noted in her chart. Advised patient's husband that they will need to put in referral with [facility] as well. Patient will schedule appointment after she has put in [facility] referral.

## 2019-03-05 ENCOUNTER — Ambulatory Visit (INDEPENDENT_AMBULATORY_CARE_PROVIDER_SITE_OTHER): Payer: No Typology Code available for payment source | Admitting: Licensed Clinical Social Worker

## 2019-03-05 ENCOUNTER — Other Ambulatory Visit: Payer: Self-pay

## 2019-03-05 ENCOUNTER — Encounter (HOSPITAL_COMMUNITY): Payer: Self-pay | Admitting: Licensed Clinical Social Worker

## 2019-03-05 DIAGNOSIS — F411 Generalized anxiety disorder: Secondary | ICD-10-CM | POA: Diagnosis not present

## 2019-03-05 NOTE — Progress Notes (Signed)
Virtual Visit via Video Note  I connected with [name] on 03/05/19 at 1:00pm by a video enabled telemedicine application and verified that I am speaking with the correct person using two identifiers.   I discussed the limitations of evaluation and management by telemedicine and the availability of in person appointments. The patient expressed understanding and agreed to proceed.  History of Present Illness: Pt was referred to therapy by her psychiatrist at [facility] for anxiety and panic attacks.   Participation Level: Active   Type of Therapy: Individual via Webex  Treatment Goals addressed: Improve psychiatric symptoms, Controlled Behavior, Moderated Mood, Improve Unhelpful Thought Patterns, Emotional Regulation Skills (Moderate moods, anger management, stress management), Feel and express a full Range of Emotions, Learn about Diagnosis, Healthy Coping Skills.  Interventions: CBT/Supportive/Psychoeducation  Summary: Cindy Jones is a 28 y.o. female who presents anxious for her counseling session. Pt discussed her psychiatric symptoms and current life events. Patient discussed her current stressors and coping skills that are working. Reviewed 21 days of gratitude with patient. Patient described her gratitudes, her appreciation. Patient described her gratiatudes from 09-07-13 years ago. Reminiscing and feeling the emotions she experienced during her teen years ( not safe, heard or grounded). Used CBT to assist with her negative emotions.      PLAN: [place] Vulnerability on you tube.  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 Assisted pt processing stressors, anxiety, building body acceptance.  Communication. Styles.   Assisted pt processing for the management  of her stressors.  Plan: See pt invidually weekly via webex  Participation Level: Active  Diagnosis: Axis I.:  GAD    Follow Up Instructions:    I discussed the assessment and treatment plan with the patient. The patient was provided an opportunity to ask questions and all were answered. The patient agreed with the plan and demonstrated an understanding of the instructions.   The patient was advised to call back or seek an in-person evaluation if the symptoms worsen or if the condition fails to improve as anticipated.  I provided 60 minutes of non-face-to-face time during this encounter.   Clotine Heiner S, LCAS

## 2019-03-07 MED FILL — ADDERALL XR 25 MG CAPSULE: 25 | 30 days supply | Qty: 30 | Fill #0

## 2019-03-07 MED FILL — AMPHETAMINE-DEXTROAMPHETAMI: 20 | 30 days supply | Qty: 30 | Fill #0

## 2019-03-12 ENCOUNTER — Ambulatory Visit (HOSPITAL_COMMUNITY): Payer: No Typology Code available for payment source | Admitting: Licensed Clinical Social Worker

## 2019-03-12 ENCOUNTER — Other Ambulatory Visit: Payer: Self-pay

## 2019-03-19 ENCOUNTER — Encounter (HOSPITAL_COMMUNITY): Payer: Self-pay | Admitting: Licensed Clinical Social Worker

## 2019-03-19 ENCOUNTER — Other Ambulatory Visit: Payer: Self-pay

## 2019-03-19 ENCOUNTER — Ambulatory Visit (INDEPENDENT_AMBULATORY_CARE_PROVIDER_SITE_OTHER): Payer: No Typology Code available for payment source | Admitting: Licensed Clinical Social Worker

## 2019-03-19 DIAGNOSIS — F411 Generalized anxiety disorder: Secondary | ICD-10-CM

## 2019-03-19 NOTE — Progress Notes (Signed)
Virtual Visit via Video Note  I connected with Cindy Jones on 03/05/19 at 1:00pm by a video enabled telemedicine application and verified that I am speaking with the correct person using two identifiers.   I discussed the limitations of evaluation and management by telemedicine and the availability of in person appointments. The patient expressed understanding and agreed to proceed.  History of Present Illness: Pt was referred to therapy by her psychiatrist at [facility] for anxiety and panic attacks.   Participation Level: Active   Type of Therapy: Individual via Webex  Treatment Goals addressed: Improve psychiatric symptoms, Controlled Behavior, Moderated Mood, Improve Unhelpful Thought Patterns, Emotional Regulation Skills (Moderate moods, anger management, stress management), Feel and express a full Range of Emotions, Learn about Diagnosis, Healthy Coping Skills.  Interventions: CBT/Supportive/Psychoeducation  Summary: Cindy Jones is a 28 y.o. female who presents anxious for her counseling session. Pt discussed her psychiatric symptoms and current life events. Patient discussed her current stressors and coping skills that are working. Patient described the communication styles between she and her spouse. Discussed with patient her tone of voice. Suggested she tape her voice and listen to her tone. Discussed  with patient the necessity to make her husband feel safe to share his emotions. Suggested buzz words for them to use when emotions become overwhelming. Role played with patient.   PLAN: Continue 21 days of gratitude, rewiring your brain book    PLAN: [place] on you tube.  Suicidal/Homicidal: Nowithout intent/plan  Therapist Response: Assessed pt's current functioning and reviewed progress.. Assisted pt processing stressors, anxiety, building body acceptance.  Communication. Styles.   Assisted pt processing for the management of her stressors.  Plan: See pt  invidually weekly via webex  Participation Level: Active  Diagnosis: Axis I.:  GAD    Follow Up Instructions:    I discussed the assessment and treatment plan with the patient. The patient was provided an opportunity to ask questions and all were answered. The patient agreed with the plan and demonstrated an understanding of the instructions.   The patient was advised to call back or seek an in-person evaluation if the symptoms worsen or if the condition fails to improve as anticipated.  I provided 60 minutes of non-face-to-face time during this encounter.   , S, LCAS

## 2019-03-20 MED FILL — DOXYCYCLINE HYC 100 MG CAPS: 100 | 7 days supply | Qty: 14 | Fill #0

## 2019-03-20 MED FILL — HYDROCODON-APAP 5-325: 5-325 | 5 days supply | Qty: 20 | Fill #0

## 2019-03-20 MED FILL — CHLORHEXIDINE 0.12% RINSE: 0.12 | 16 days supply | Qty: 473 | Fill #0

## 2019-03-21 MED FILL — ONDANSETRON HCL 4 MG TABLET: 4 | 5 days supply | Qty: 10 | Fill #0

## 2019-03-26 ENCOUNTER — Encounter (HOSPITAL_COMMUNITY): Payer: Self-pay | Admitting: Licensed Clinical Social Worker

## 2019-03-26 ENCOUNTER — Ambulatory Visit (INDEPENDENT_AMBULATORY_CARE_PROVIDER_SITE_OTHER): Payer: No Typology Code available for payment source | Admitting: Licensed Clinical Social Worker

## 2019-03-26 ENCOUNTER — Other Ambulatory Visit: Payer: Self-pay

## 2019-03-26 DIAGNOSIS — F411 Generalized anxiety disorder: Secondary | ICD-10-CM

## 2019-03-26 NOTE — Progress Notes (Signed)
Virtual Visit via Video Note  I connected with Cindy Jones on 03/05/19 at 1:00pm by a video enabled telemedicine application and verified that I am speaking with the correct person using two identifiers.   I discussed the limitations of evaluation and management by telemedicine and the availability of in person appointments. The patient expressed understanding and agreed to proceed.  History of Present Illness: Pt was referred to therapy by her psychiatrist at [facility] for anxiety and panic attacks.   Participation Level: Active   Type of Therapy: Individual via Webex  Treatment Goals addressed: Improve psychiatric symptoms, Controlled Behavior, Moderated Mood, Improve Unhelpful Thought Patterns, Emotional Regulation Skills (Moderate moods, anger management, stress management), Feel and express a full Range of Emotions, Learn about Diagnosis, Healthy Coping Skills.  Interventions: CBT/Supportive/Psychoeducation  Summary: Cindy Jones is a 28 y.o. female who presents anxious for her counseling session. Pt discussed her psychiatric symptoms and current life events. Patient discussed her current stressors and coping skills. Patient reports she and her husband have worked out safe buzz words to use in problem solving, as we worked on in last session. Role played chosen buzz words.Patient discussed her concerns about thanksgiving and her eating disorder. Discussed restricting food and portion control.  PLAN: Continue 21 days of gratitude, rewiring your brain book    PLAN: [place] on you tube.  Suicidal/Homicidal: Nowithout intent/plan  Therapist Response: Assessed pt's current functioning and reviewed progress.. Assisted pt processing stressors, anxiety, building body acceptance.  Communication. Styles.   Assisted pt processing for the management of her stressors.  Plan: See pt invidually weekly via webex  Participation Level: Active  Diagnosis: Axis I.:  GAD    Follow  Up Instructions:    I discussed the assessment and treatment plan with the patient. The patient was provided an opportunity to ask questions and all were answered. The patient agreed with the plan and demonstrated an understanding of the instructions.   The patient was advised to call back or seek an in-person evaluation if the symptoms worsen or if the condition fails to improve as anticipated.  I provided 60 minutes of non-face-to-face time during this encounter.   Cindy Jones S, LCAS

## 2019-04-03 MED FILL — ADDERALL XR 25 MG CAPSULE: 25 | 30 days supply | Qty: 30 | Fill #0

## 2019-04-03 MED FILL — AMPHETAMINE-DEXTROAMPHETAMI: 20 | 30 days supply | Qty: 30 | Fill #0

## 2019-04-09 ENCOUNTER — Encounter (HOSPITAL_COMMUNITY): Payer: Self-pay | Admitting: Licensed Clinical Social Worker

## 2019-04-09 ENCOUNTER — Other Ambulatory Visit: Payer: Self-pay

## 2019-04-09 ENCOUNTER — Ambulatory Visit (INDEPENDENT_AMBULATORY_CARE_PROVIDER_SITE_OTHER): Payer: No Typology Code available for payment source | Admitting: Licensed Clinical Social Worker

## 2019-04-09 DIAGNOSIS — F411 Generalized anxiety disorder: Secondary | ICD-10-CM

## 2019-04-09 NOTE — Progress Notes (Signed)
Virtual Visit via Video Note  I connected with Cindy Jones on 03/05/19 at 1:00pm by a video enabled telemedicine application and verified that I am speaking with the correct person using two identifiers.   I discussed the limitations of evaluation and management by telemedicine and the availability of in person appointments. The patient expressed understanding and agreed to proceed.  History of Present Illness: Pt was referred to therapy by her psychiatrist at [facility] for anxiety and panic attacks.   Participation Level: Active   Type of Therapy: Individual via Webex  Treatment Goals addressed: Improve psychiatric symptoms, Controlled Behavior, Moderated Mood, Improve Unhelpful Thought Patterns, Emotional Regulation Skills (Moderate moods, anger management, stress management), Feel and express a full Range of Emotions, Learn about Diagnosis, Healthy Coping Skills.  Interventions: CBT/Supportive/Psychoeducation  Summary: Cindy Jones is a 28 y.o. female who presents anxious for her counseling session. Pt discussed her psychiatric symptoms and current life events. Patient discussed her current stressors and coping skills. Patient reports she and her husband have worked out safe buzz words to use in problem solving, as we worked on in last session. Role played chosen buzz words.Patient discussed her concerns about thanksgiving and her eating disorder. Discussed restricting food and portion control.  PLAN: Continue 21 days of gratitude, rewiring your brain book    PLAN: [place] on you tube.  Suicidal/Homicidal: Nowithout intent/plan  Therapist Response: Assessed pt's current functioning and reviewed progress.. Assisted pt processing stressors, anxiety, building body acceptance.  Communication. Styles.   Assisted pt processing for the management of her stressors.  Plan: See pt invidually weekly via webex  Participation Level: Active  Diagnosis: Axis I.:  GAD    Follow  Up Instructions:    I discussed the assessment and treatment plan with the patient. The patient was provided an opportunity to ask questions and all were answered. The patient agreed with the plan and demonstrated an understanding of the instructions.   The patient was advised to call back or seek an in-person evaluation if the symptoms worsen or if the condition fails to improve as anticipated.  I provided 60 minutes of non-face-to-face time during this encounter.   [place]       Virtual Visit via Video Note  I connected with Cindy Jones on 03/05/19 at 1:00pm by a video enabled telemedicine application and verified that I am speaking with the correct person using two identifiers.   I discussed the limitations of evaluation and management by telemedicine and the availability of in person appointments. The patient expressed understanding and agreed to proceed.  History of Present Illness: Pt was referred to therapy by her psychiatrist at [facility] for anxiety and panic attacks.   Participation Level: Active   Type of Therapy: Individual via Webex  Treatment Goals addressed: Improve psychiatric symptoms, Controlled Behavior, Moderated Mood, Improve Unhelpful Thought Patterns, Emotional Regulation Skills (Moderate moods, anger management, stress management), Feel and express a full Range of Emotions, Learn about Diagnosis, Healthy Coping Skills.  Interventions: CBT/Supportive/Psychoeducation  Summary: Cindy Jones is a 28 y.o. female who presents anxious for her counseling session. Pt discussed her psychiatric symptoms and current life events. Patient discussed her current stressors and coping skills. Patient reports she and her husband have worked out safe buzz words to use in problem solving, as we worked on in last session. Role played chosen buzz words.Patient discussed her concerns about thanksgiving and her eating disorder. Discussed restricting food and portion  control.  PLAN: Continue 21 days of gratitude, rewiring your brain book    PLAN: [place] on you tube.  Suicidal/Homicidal: Nowithout intent/plan  Therapist Response: Assessed pt's current functioning and reviewed progress.. Assisted pt processing stressors, anxiety, building body acceptance.  Communication. Styles.   Assisted pt processing for the management of her stressors.  Plan: See pt invidually weekly via webex  Participation Level: Active  Diagnosis: Axis I.:  GAD    Follow Up Instructions:    I discussed the assessment and treatment plan with the patient. The patient was provided an opportunity to ask questions and all were answered. The patient agreed with the plan and demonstrated an understanding of the instructions.   The patient was advised to call back or seek an in-person evaluation if the symptoms worsen or if the condition fails to improve as anticipated.  I provided 60 minutes of non-face-to-face time during this encounter.   [name],[name], LCAS       Virtual Visit via Video Note  I connected with Cindy Jones on 03/05/19 at 1:00pm by a video enabled telemedicine application and verified that I am speaking with the correct person using two identifiers.   I discussed the limitations of evaluation and management by telemedicine and the availability of in person appointments. The patient expressed understanding and agreed to proceed.  History of Present Illness: Pt was referred to therapy by her psychiatrist at [facility] for anxiety and panic attacks.   Participation Level: Active   Type of Therapy: Individual via Webex  Treatment Goals addressed: Improve psychiatric symptoms, Controlled Behavior, Moderated Mood, Improve Unhelpful Thought Patterns, Emotional Regulation Skills (Moderate moods, anger management, stress management), Feel and express a full Range of Emotions, Learn about Diagnosis, Healthy Coping Skills.  Interventions:  CBT/Supportive/Psychoeducation  Summary: Cindy Jones is a 28 y.o. female who presents anxious for her counseling session. Pt discussed her psychiatric symptoms and current life events. Patient discussed her current stressors and coping skills. Patient described a current friend relationship she is struggling with. Patient shared how she feels when she is around this person. Role played effective communication skills using I statements. Discussed how this relationship is affecting her self worth. Coached patient on her self worth.      PLAN: Continue 21 days of gratitude, rewiring your brain book    PLAN: [place] on you tube. How did thanksgiving affect her eating disorder.  Suicidal/Homicidal: Nowithout intent/plan  Therapist Response: Assessed pt's current functioning and reviewed progress.. Assisted pt processing stressors, anxiety, building body acceptance.  Communication. Styles.   Assisted pt processing for the management of her stressors.  Plan: See pt invidually weekly via webex  Participation Level: Active  Diagnosis: Axis I.:  GAD    Follow Up Instructions:    I discussed the assessment and treatment plan with the patient. The patient was provided an opportunity to ask questions and all were answered. The patient agreed with the plan and demonstrated an understanding of the instructions.   The patient was advised to call back or seek an in-person evaluation if the symptoms worsen or if the condition fails to improve as anticipated.  I provided 60 minutes of non-face-to-face time during this encounter.   [name],[name], LCAS

## 2019-04-16 ENCOUNTER — Other Ambulatory Visit: Payer: Self-pay

## 2019-04-16 ENCOUNTER — Ambulatory Visit (HOSPITAL_COMMUNITY): Payer: No Typology Code available for payment source | Admitting: Licensed Clinical Social Worker

## 2019-04-17 ENCOUNTER — Other Ambulatory Visit: Payer: Self-pay

## 2019-04-17 ENCOUNTER — Ambulatory Visit (INDEPENDENT_AMBULATORY_CARE_PROVIDER_SITE_OTHER): Payer: No Typology Code available for payment source | Admitting: Psychiatry

## 2019-04-17 ENCOUNTER — Encounter (HOSPITAL_COMMUNITY): Payer: Self-pay | Admitting: Psychiatry

## 2019-04-17 DIAGNOSIS — F900 Attention-deficit hyperactivity disorder, predominantly inattentive type: Secondary | ICD-10-CM | POA: Diagnosis not present

## 2019-04-17 DIAGNOSIS — F952 Tourette's disorder: Secondary | ICD-10-CM | POA: Diagnosis not present

## 2019-04-17 DIAGNOSIS — F988 Other specified behavioral and emotional disorders with onset usually occurring in childhood and adolescence: Secondary | ICD-10-CM

## 2019-04-17 DIAGNOSIS — F410 Panic disorder [episodic paroxysmal anxiety] without agoraphobia: Secondary | ICD-10-CM

## 2019-04-17 DIAGNOSIS — F502 Bulimia nervosa: Secondary | ICD-10-CM | POA: Diagnosis not present

## 2019-04-17 DIAGNOSIS — F411 Generalized anxiety disorder: Secondary | ICD-10-CM

## 2019-04-17 DIAGNOSIS — F325 Major depressive disorder, single episode, in full remission: Secondary | ICD-10-CM

## 2019-04-17 MED ORDER — AMPHETAMINE-DEXTROAMPHET ER 25 MG PO CP24: 25.0000 mg | ORAL_CAPSULE | ORAL | 0 refills | Status: DC

## 2019-04-17 MED ORDER — AMPHETAMINE-DEXTROAMPHETAMINE 20 MG PO TABS: 20.0000 mg | ORAL_TABLET | Freq: Every day | ORAL | 0 refills | Status: DC | PRN

## 2019-04-17 MED ORDER — AMPHETAMINE-DEXTROAMPHET ER 25 MG PO CP24: 25.0000 mg | ORAL_CAPSULE | Freq: Every day | ORAL | 0 refills | Status: DC

## 2019-04-17 MED ORDER — AMPHETAMINE-DEXTROAMPHETAMINE 20 MG PO TABS: 20.0000 mg | ORAL_TABLET | Freq: Every day | ORAL | 0 refills | Status: DC

## 2019-04-17 NOTE — Progress Notes (Signed)
Virtual Visit via Telephone Note  I connected with Cindy Jones  on 04/17/19 at 10:00 AM EST by telephone and verified that I am speaking with the correct person using two identifiers.  Location: Patient: home Provider: office   I discussed the limitations, risks, security and privacy concerns of performing an evaluation and management service by telephone and the availability of in person appointments. I also discussed with the patient that there may be a patient responsible charge related to this service. The patient expressed understanding and agreed to proceed.   History of Present Illness: Cindy Jones has been having anxiety. Her husband is working on a [place] unit. She is worried about him. The holiday season is hard because of the infection and the passing of her grandfather a few years ago. She is having racing thoughts when she lies down at night. Cindy Jones has been taking Melatonin at night for the last 1 week and it helps. If she is active then she is able to fall asleep easier. She does not always wake up feeling rested. She is eating well and denies any episodes of binging or restricting her diet. It helps to keep a routine of food prepping and cooking dinner. She has been baking due to the holidays. Cindy Jones has some stress related depression. She tends to feel depressed if she is not doing enough. Cindy Jones admits she is feeling "depleted" due to the pandemic. She denies hopelessness. Cindy Jones denies SI/HI. When stressed or overworked she does have some tics. Cindy Jones takes her Adderall XR at 7:30-8am. Work starts at 8:30am. Her attention span is good from 9am-12:30p. Her tics can begin around 2:30-3pm which is an indication that the medication effect has worn off. Work Press photographer at [employer]. She is thinking it might be better to take the short acting in the AM and longer acting a little later. It could all be due to working from home.     Observations/Objective: I spoke with Cindy Jones on the phone.  Pt  was calm, pleasant and cooperative.  Pt was engaged in the conversation and answered questions appropriately.  Speech was clear and coherent with normal rate, tone and volume.  Mood is anxious, affect is full. Thought processes are coherent, goal oriented and intact.  Thought content is logical.  Pt denies SI/HI.   Pt denies auditory and visual hallucinations and did not appear to be responding to internal stimuli.  Memory and concentration are good.  Fund of knowledge and use of language are average.  Insight and judgment are fair.  I am unable to comment on psychomotor activity, general appearance, hygiene, or eye contact as I was unable to physically see the patient on the phone.  Vital signs not available since interview conducted virtually.    I reviewed the information below on 04/17/2019 and have updated it Assessment and Plan: ADHD-inattentive type; Tourette's d/o; Panic attacks; Bulimia Nervosa; MDD- in remission  Status of current symptoms: ADHD mildly worse. Anxiety is increased due to stressors  Adderall XR 25mg  po qAM for ADHD.   Adderall 20mg  po qD prn ADHD for late afternoon for work days  Pt instructed to take Adderall 20mg  at 7:30am and then take Adderall XR by 11am. If effective continue. If not then we can discuss other options at her next appointment.    Encouraged to continue therapy weekly   Follow Up Instructions: In 12 weeks or sooner if needed   I discussed the assessment and treatment plan with the patient. The  patient was provided an opportunity to ask questions and all were answered. The patient agreed with the plan and demonstrated an understanding of the instructions.   The patient was advised to call back or seek an in-person evaluation if the symptoms worsen or if the condition fails to improve as anticipated.  I provided 30 minutes of non-face-to-face time during this encounter.   Oletta Darter, MD

## 2019-04-23 ENCOUNTER — Encounter (HOSPITAL_COMMUNITY): Payer: Self-pay | Admitting: Licensed Clinical Social Worker

## 2019-04-23 ENCOUNTER — Ambulatory Visit (INDEPENDENT_AMBULATORY_CARE_PROVIDER_SITE_OTHER): Payer: No Typology Code available for payment source | Admitting: Licensed Clinical Social Worker

## 2019-04-23 ENCOUNTER — Other Ambulatory Visit: Payer: Self-pay

## 2019-04-23 DIAGNOSIS — F952 Tourette's disorder: Secondary | ICD-10-CM | POA: Diagnosis not present

## 2019-04-23 DIAGNOSIS — F411 Generalized anxiety disorder: Secondary | ICD-10-CM | POA: Diagnosis not present

## 2019-04-23 NOTE — Progress Notes (Signed)
Virtual Visit via Video Note  I connected with Cindy Jones on 04/23/19 at 1:00pm by a video enabled telemedicine application and verified that I am speaking with the correct person using two identifiers.   I discussed the limitations of evaluation and management by telemedicine and the availability of in person appointments. The patient expressed understanding and agreed to proceed.  History of Present Illness: Pt was referred to therapy by her psychiatrist at [facility] for anxiety and panic attacks.   Participation Level: Active   Type of Therapy: Individual via Webex  Treatment Goals addressed: Improve psychiatric symptoms, Controlled Behavior, Moderated Mood, Improve Unhelpful Thought Patterns, Emotional Regulation Skills (Moderate moods, anger management, stress management), Feel and express a full Range of Emotions, Learn about Diagnosis, Healthy Coping Skills.  Interventions: CBT/Supportive/Psychoeducation  Summary: Cindy Jones is a 28 y.o. female who presents anxious with a low lever of depression for her counseling session. Pt discussed her psychiatric symptoms and current life events. Patient discussed her current stressors and coping skills. Reviewed tx plan with patient who verbalized her acceptance of the plan. Patient reports, "I'm overwhelmed, more anxious, some depression." Used socratic questions. "ive some decisions to make in my life: starting a family, going to grad school, staying with my current job." Assisted patient with problem solving skills. Suggested to patient to fucus on her self care during this season.    PLAN: Continue 21 days of gratitude, rewiring your brain book    PLAN: [place] on you tube.  Suicidal/Homicidal: Nowithout intent/plan  Therapist Response: Assessed pt's current functioning and reviewed progress.. Assisted pt processing stressors, anxiety, building body acceptance.  Communication. Styles.   Assisted pt processing for the  management of her stressors.  Plan: See pt invidually weekly via webex  Participation Level: Active  Diagnosis: Axis I.:  GAD    Follow Up Instructions:    I discussed the assessment and treatment plan with the patient. The patient was provided an opportunity to ask questions and all were answered. The patient agreed with the plan and demonstrated an understanding of the instructions.   The patient was advised to call back or seek an in-person evaluation if the symptoms worsen or if the condition fails to improve as anticipated.  I provided 60 minutes of non-face-to-face time during this encounter.   Blessyn Sommerville S, LCAS

## 2019-04-30 ENCOUNTER — Ambulatory Visit (INDEPENDENT_AMBULATORY_CARE_PROVIDER_SITE_OTHER): Payer: No Typology Code available for payment source | Admitting: Licensed Clinical Social Worker

## 2019-04-30 ENCOUNTER — Encounter (HOSPITAL_COMMUNITY): Payer: Self-pay | Admitting: Licensed Clinical Social Worker

## 2019-04-30 ENCOUNTER — Other Ambulatory Visit: Payer: Self-pay

## 2019-04-30 DIAGNOSIS — F988 Other specified behavioral and emotional disorders with onset usually occurring in childhood and adolescence: Secondary | ICD-10-CM

## 2019-04-30 DIAGNOSIS — F411 Generalized anxiety disorder: Secondary | ICD-10-CM

## 2019-04-30 NOTE — Progress Notes (Signed)
Virtual Visit via Video Note  I connected with Cindy Jones on 04/30/19 at 1:00pm by a video enabled telemedicine application and verified that I am speaking with the correct person using two identifiers.   I discussed the limitations of evaluation and management by telemedicine and the availability of in person appointments. The patient expressed understanding and agreed to proceed.  History of Present Illness: Pt was referred to therapy by her psychiatrist at [facility] for anxiety and panic attacks.   Participation Level: Active   Type of Therapy: Individual via Webex  Treatment Goals addressed: Improve psychiatric symptoms, Controlled Behavior, Moderated Mood, Improve Unhelpful Thought Patterns, Emotional Regulation Skills (Moderate moods, anger management, stress management), Feel and express a full Range of Emotions, Learn about Diagnosis, Healthy Coping Skills.  Interventions: CBT/Supportive/Psychoeducation  Summary: Cindy Jones is a 28 y.o. female who presents anxious with a low lever of depression for her counseling session. Pt discussed her psychiatric symptoms and current life events. Patient discussed her christmas holiday stressors and coping skills. Reviewed CCA with patient. 'Over Christmas I was overwhelmed, more anxious, with some depression." Used socratic questions. Patient described how her negative thoughts cycled into lashing out at her mother. Counselor challenged her rrational and unhelpful thoughts Counselor assisted patient influencing her moods and behaviors in a positive way by eliminating her unhelpful or irrational thought patterns,   Assessment and plan: Counselor will continue to meet with patient to address treatment plan goals. Patient will continue to follow recommendations of providers and implement skills learned in session.  Suicidal/Homicidal: Nowithout intent/plan  Therapist Response: Assessed pt's current functioning and reviewed progress.. Assisted pt  processing stressors, negative thoughts.  Assisted pt processing for the management of her stressors.  Plan: See pt invidually weekly via webex  Participation Level: Active  Diagnosis: Axis I.:  GAD, ADD    Follow Up Instructions:   I discussed the assessment and treatment plan with the patient. The patient was provided an opportunity to ask questions and all were answered. The patient agreed with the plan and demonstrated an understanding of the instructions.   The patient was advised to call back or seek an in-person evaluation if the symptoms worsen or if the condition fails to improve as anticipated.  I provided 60 minutes of non-face-to-face time during this encounter.   Lithzy Bernard S, LCAS

## 2019-05-07 ENCOUNTER — Ambulatory Visit (INDEPENDENT_AMBULATORY_CARE_PROVIDER_SITE_OTHER): Payer: No Typology Code available for payment source | Admitting: Licensed Clinical Social Worker

## 2019-05-07 ENCOUNTER — Encounter (HOSPITAL_COMMUNITY): Payer: Self-pay | Admitting: Licensed Clinical Social Worker

## 2019-05-07 ENCOUNTER — Other Ambulatory Visit: Payer: Self-pay

## 2019-05-07 DIAGNOSIS — F411 Generalized anxiety disorder: Secondary | ICD-10-CM

## 2019-05-07 NOTE — Progress Notes (Signed)
Virtual Visit via Video Note  I connected with Cindy Jones on 05/07/19 at 2:00pm by a video enabled telemedicine application and verified that I am speaking with the correct person using two identifiers.   I discussed the limitations of evaluation and management by telemedicine and the availability of in person appointments. The patient expressed understanding and agreed to proceed.  History of Present Illness: Pt was referred to therapy by her psychiatrist at [facility] for anxiety and panic attacks.   Participation Level: Active   Type of Therapy: Individual via Webex  Treatment Goals addressed: Improve psychiatric symptoms, Controlled Behavior, Moderated Mood, Improve Unhelpful Thought Patterns, Emotional Regulation Skills (Moderate moods, anger management, stress management), Feel and express a full Range of Emotions, Learn about Diagnosis, Healthy Coping Skills.  Interventions: CBT/Supportive/Psychoeducation  Summary: Cindy Jones is a 29 y.o. female who presents anxious with a low lever of depression for her counseling session. Pt discussed her psychiatric symptoms and current life events. Patient discussed her current symptoms and coping skills. Patient reports more stability in her moods. Her husband joined the session who reports they are communicating more effective. They role played communication skills: tone of voice, I statements, asking for needs to be met. Patient reports she is working on her gratitude. Asked open ended questions. Patient discussed her goals for the upcoming years.      Assessment and plan: Counselor will continue to meet with patient to address treatment plan goals. Patient will continue to follow recommendations of providers and implement skills learned in session.  Suicidal/Homicidal: Nowithout intent/plan  Therapist Response: Assessed pt's current functioning and reviewed progress.. Assisted pt processing stressors, negative thoughts.  Assisted pt processing  for the management of her stressors.  Assessment and Plan: Counselor will continue to meet with patient to address treatment plan goals. Patient will continue to follow recommendations of providers and implement skills learned in session.   Participation Level: Active  Diagnosis: Axis I.:  GAD    Follow Up Instructions:   I discussed the assessment and treatment plan with the patient. The patient was provided an opportunity to ask questions and all were answered. The patient agreed with the plan and demonstrated an understanding of the instructions.   The patient was advised to call back or seek an in-person evaluation if the symptoms worsen or if the condition fails to improve as anticipated.  I provided 60 minutes of non-face-to-face time during this encounter.   Marice Guidone S, LCAS

## 2019-05-14 ENCOUNTER — Other Ambulatory Visit: Payer: Self-pay

## 2019-05-14 ENCOUNTER — Encounter (HOSPITAL_COMMUNITY): Payer: Self-pay | Admitting: Licensed Clinical Social Worker

## 2019-05-14 ENCOUNTER — Ambulatory Visit (INDEPENDENT_AMBULATORY_CARE_PROVIDER_SITE_OTHER): Payer: No Typology Code available for payment source | Admitting: Licensed Clinical Social Worker

## 2019-05-14 DIAGNOSIS — F411 Generalized anxiety disorder: Secondary | ICD-10-CM | POA: Diagnosis not present

## 2019-05-14 NOTE — Progress Notes (Signed)
Virtual Visit via Video Note  I connected with Cindy Jones on 05/07/19 at 2:00pm by a video enabled telemedicine application and verified that I am speaking with the correct person using two identifiers.   I discussed the limitations of evaluation and management by telemedicine and the availability of in person appointments. The patient expressed understanding and agreed to proceed.  History of Present Illness: Pt was referred to therapy by her psychiatrist at [facility] for anxiety and panic attacks.   Participation Level: Active   Type of Therapy: Individual via Webex  Treatment Goals addressed: Improve psychiatric symptoms, Controlled Behavior, Moderated Mood, Improve Unhelpful Thought Patterns, Emotional Regulation Skills (Moderate moods, anger management, stress management), Feel and express a full Range of Emotions, Learn about Diagnosis, Healthy Coping Skills.  Interventions: CBT/Supportive/Psychoeducation  Summary: Cindy Jones is a 29 y.o. female who presents anxious for her webex individual counseling session.  Pt discussed her psychiatric symptoms and current life events. Patient discussed her current psychiatric symptoms and coping skills. Patient reports continued stability in her moods. 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 Assisted patient scheduling her time to include work, school, studying, meal prepping, time with husband, sleep hygiene. Patient discussed her communication efforts with her husband this past week. Role played possible communication scenarios with patient for more effective communication with husband.    Assessment and plan: Counselor will continue to meet with patient to address treatment plan goals. Patient will continue to follow recommendations of providers and implement skills learned in session.  Suicidal/Homicidal: Nowithout intent/plan  Therapist  Response: Assessed pt's current functioning and reviewed progress.. Assisted pt processing stressors, negative thoughts.  Assisted pt processing for the management of her stressors.  Assessment and Plan: Counselor will continue to meet with patient to address treatment plan goals. Patient will continue to follow recommendations of providers and implement skills learned in session.   Participation Level: Active  Diagnosis: Axis I.:  GAD    Follow Up Instructions:   I discussed the assessment and treatment plan with the patient. The patient was provided an opportunity to ask questions and all were answered. The patient agreed with the plan and demonstrated an understanding of the instructions.   The patient was advised to call back or seek an in-person evaluation if the symptoms worsen or if the condition fails to improve as anticipated.  I provided 60 minutes of non-face-to-face time during this encounter.   [name],[name], LCAS

## 2019-05-15 MED FILL — DEXTROAMP-AMPHETAMIN 20 MG: 20 | 30 days supply | Qty: 30 | Fill #0

## 2019-05-15 MED FILL — ADDERALL XR 25 MG CAPSULE: 25 | 30 days supply | Qty: 30 | Fill #0

## 2019-05-21 ENCOUNTER — Other Ambulatory Visit: Payer: Self-pay

## 2019-05-21 ENCOUNTER — Ambulatory Visit (INDEPENDENT_AMBULATORY_CARE_PROVIDER_SITE_OTHER): Payer: No Typology Code available for payment source | Admitting: Licensed Clinical Social Worker

## 2019-05-21 ENCOUNTER — Encounter (HOSPITAL_COMMUNITY): Payer: Self-pay | Admitting: Licensed Clinical Social Worker

## 2019-05-21 DIAGNOSIS — F411 Generalized anxiety disorder: Secondary | ICD-10-CM

## 2019-05-21 NOTE — Progress Notes (Signed)
Virtual Visit via Video Note  I connected with Cindy Jones on 05/21/19 at 2:00pm by a video enabled telemedicine application and verified that I am speaking with the correct person using two identifiers.   I discussed the limitations of evaluation and management by telemedicine and the availability of in person appointments. The patient expressed understanding and agreed to proceed.  History of Present Illness: Pt was referred to therapy by her psychiatrist at [facility] for anxiety and panic attacks.   Participation Level: Active   Type of Therapy: Individual via Webex  Treatment Goals addressed: Improve psychiatric symptoms, Controlled Behavior, Moderated Mood, Improve Unhelpful Thought Patterns, Emotional Regulation Skills (Moderate moods, anger management, stress management), Feel and express a full Range of Emotions, Learn about Diagnosis, Healthy Coping Skills.  Interventions: CBT/Supportive/Psychoeducation  Summary: Cindy Jones is a 29 y.o. female who presents anxious for her webex family counseling session, her mother joining our webex session.  Pt discussed her psychiatric symptoms and current life events. Counselor provided a safe platform for patient to talk with her mother to continue the process of repairing their relationship. Counselor coached both on effective communication using I statements and asking for needs to be met. Discussed a schedule for continued therapy session.   Assessment and plan: Counselor will continue to meet with patient to address treatment plan goals. Patient will continue to follow recommendations of providers and implement skills learned in session.  Suicidal/Homicidal: Nowithout intent/plan  Therapist Response: Assessed pt's current functioning and reviewed progress.. Assisted pt processing stressors, negative thoughts.  Assisted pt processing for the management of her stressors.  Participation Level: Active  Diagnosis: Axis I.:  GAD    Follow  Up Instructions:   I discussed the assessment and treatment plan with the patient. The patient was provided an opportunity to ask questions and all were answered. The patient agreed with the plan and demonstrated an understanding of the instructions.   The patient was advised to call back or seek an in-person evaluation if the symptoms worsen or if the condition fails to improve as anticipated.  I provided 60 minutes of non-face-to-face time during this encounter.   Yaira Bernardi S, LCAS

## 2019-05-28 ENCOUNTER — Other Ambulatory Visit: Payer: Self-pay

## 2019-05-28 ENCOUNTER — Ambulatory Visit (INDEPENDENT_AMBULATORY_CARE_PROVIDER_SITE_OTHER): Payer: No Typology Code available for payment source | Admitting: Licensed Clinical Social Worker

## 2019-05-28 ENCOUNTER — Encounter (HOSPITAL_COMMUNITY): Payer: Self-pay | Admitting: Licensed Clinical Social Worker

## 2019-05-28 DIAGNOSIS — F411 Generalized anxiety disorder: Secondary | ICD-10-CM | POA: Diagnosis not present

## 2019-05-28 NOTE — Progress Notes (Signed)
Comprehensive Clinical Assessment (CCA) Note  05/28/2019 Cindy Jones [id number]  Visit Diagnosis:      ICD-10-CM   1. GAD (generalized anxiety disorder)  F41.1       CCA Part One  Part One has been completed on paper by the patient.  (See scanned document in Chart Review)  CCA Part Two A  Intake/Chief Complaint:  CCA Intake With Chief Complaint CCA Part Two Date: 05/28/19 CCA Part Two Time: 1421 Chief Complaint/Presenting Problem: Patient continues to have anxiety, panic attacks, and adhd symptoms Patients Currently Reported Symptoms/Problems: increase anxiety, panic attacks, hyperactive symptoms Collateral Involvement: therapy and psychiatry notes Individual's Strengths: motivated, on going therapy Individual's Preferences: prefers to not have symptoms Individual's Abilities: ability to work through mental health issues Type of Services Patient Feels Are Needed: op therapy and medication managment  Mental Health Symptoms Depression:  Depression: N/A  Mania:  Mania: N/A  Anxiety:   Anxiety: Difficulty concentrating, Irritability, Tension  Psychosis:  Psychosis: N/A  Trauma:  Trauma: Avoids reminders of event  Obsessions:  Obsessions: N/A  Compulsions:  Compulsions: N/A  Inattention:  Inattention: N/A  Hyperactivity/Impulsivity:  Hyperactivity/Impulsivity: Blurts out answers, Difficulty waiting turn, Feeling of restlessness  Oppositional/Defiant Behaviors:  Oppositional/Defiant Behaviors: N/A  Borderline Personality:  Emotional Irregularity: N/A  Other Mood/Personality Symptoms:      Mental Status Exam Appearance and self-care  Stature:  Stature: Average  Weight:  Weight: Average weight  Clothing:  Clothing: Casual  Grooming:  Grooming: Normal  Cosmetic use:  Cosmetic Use: None  Posture/gait:  Posture/Gait: Normal  Motor activity:  Motor Activity: Not Remarkable  Sensorium  Attention:  Attention: Normal  Concentration:  Concentration: Anxiety interferes   Orientation:  Orientation: X5  Recall/memory:  Recall/Memory: Normal  Affect and Mood  Affect:  Affect: Appropriate  Mood:  Mood: Euthymic  Relating  Eye contact:  Eye Contact: Normal  Facial expression:  Facial Expression: Responsive  Attitude toward examiner:  Attitude Toward Examiner: Cooperative  Thought and Language  Speech flow: Speech Flow: Normal  Thought content:  Thought Content: Appropriate to mood and circumstances  Preoccupation:     Hallucinations:     Organization:     Transport planner of Knowledge:  Fund of Knowledge: Average  Intelligence:  Intelligence: Above [employer]  Abstraction:  Abstraction: Normal  Judgement:  Judgement: Normal  Reality Testing:  Reality Testing: Adequate  Insight:  Insight: Fair  Decision Making:  Decision Making: Normal  Social Functioning  Social Maturity:  Social Maturity: Responsible  Social Judgement:  Social Judgement: Normal  Stress  Stressors:  Stressors: Work, Family conflict  Coping Ability:  Coping Ability: English as a second language teacher Deficits:     Supports:      Family and Psychosocial History: Family history Marital status: Married Number of Years Married: 4 What types of issues is patient dealing with in the relationship?: communication Does patient have children?: No  Childhood History:  Childhood History By whom was/is the patient raised?: Both parents Additional childhood history information: father passed away at age 89 Description of patient's relationship with caregiver when they were a child: good until father passed and mother fell apart, drug addict and alcoholic Patient's description of current relationship with people who raised him/her: tenuous relationship with mother How were you disciplined when you got in trouble as a child/adolescent?: whipping Does patient have siblings?: Yes Number of Siblings: 1 Description of patient's current relationship with siblings: on/off again with twin sister Did  patient suffer from severe  childhood neglect?: Yes Patient description of severe childhood neglect: after father passed i didn't have a parent Has patient ever been sexually abused/assaulted/raped as an adolescent or adult?: No Was the patient ever a victim of a crime or a disaster?: No Witnessed domestic violence?: No Has patient been effected by domestic violence as an adult?: No  CCA Part Two B  Employment/Work Situation: Employment / Work Psychologist, occupational Employment situation: Employed How long has patient been employed?: 5 years What is the longest time patient has a held a job?: 5 years Where was the patient employed at that time?: this job Did You Receive Any Psychiatric Treatment/Services While in Equities trader?: No Are There Guns or Other Weapons in Your Home?: No Are These Weapons Safely Secured?: Yes  Education: Education School Currently Attending: [employer] Last Grade Completed: 16 Did You Graduate From [employer]?: Yes Did You Attend College?: Yes Did You Attend Graduate School?: Yes Did You Have Any Special Interests In School?: finance; international club Did You Have An Individualized Education Program (IIEP): No Did You Have Any Difficulty At [employer]?: Yes Were Any Medications Ever Prescribed For These Difficulties?: No  Religion: Religion/Spirituality Are You A Religious Person?: No How Might This Affect Treatment?: no  Leisure/Recreation: Leisure / Recreation Leisure and Hobbies: play with my dog, be with my husband  Exercise/Diet: Exercise/Diet Do You Exercise?: Yes How Many Times a Week Do You Exercise?: 1-3 times a week Have You Gained or Lost A Significant Amount of Weight in the Past Six Months?: No Do You Follow a Special Diet?: Yes Type of Diet: keto Do You Have Any Trouble Sleeping?: No  CCA Part Two C  Alcohol/Drug Use: Alcohol / Drug Use Pain Medications: no Prescriptions: adderral; Over the Counter:  excedrin History of alcohol / drug use?: No history of alcohol / drug abuse                      CCA Part Three  ASAM's:  Six Dimensions of Multidimensional Assessment  Dimension 1:  Acute Intoxication and/or Withdrawal Potential:     Dimension 2:  Biomedical Conditions and Complications:     Dimension 3:  Emotional, Behavioral, or Cognitive Conditions and Complications:     Dimension 4:  Readiness to Change:     Dimension 5:  Relapse, Continued use, or Continued Problem Potential:     Dimension 6:  Recovery/Living Environment:      Substance use Disorder (SUD)    Social Function:  Social Functioning Social Maturity: Responsible Social Judgement: Normal  Stress:  Stress Stressors: Work, Family conflict Coping Ability: Overwhelmed Patient Takes Medications The Way The Doctor Instructed?: Yes Priority Risk: Low Acuity  Risk Assessment- Self-Harm Potential: Risk Assessment For Self-Harm Potential Thoughts of Self-Harm: No current thoughts Method: No plan Availability of Means: No access/NA  Risk Assessment -Dangerous to Others Potential: Risk Assessment For Dangerous to Others Potential Method: No Plan Availability of Means: No access or NA Intent: Vague intent or NA Notification Required: No need or identified person  DSM5 Diagnoses: Patient Active Problem List   Diagnosis Date Noted  . GAD (generalized anxiety disorder) 11/07/2018  . Overweight (BMI 25.0-29.9) 06/03/2018  . Bulimia 04/20/2015  . ADD (attention deficit disorder) 01/15/2014  . Tourette disease 10/18/2013    Patient Centered Plan: Patient is on the following Treatment Plan(s):  anxiety  Recommendations for Services/Supports/Treatments: Recommendations for Services/Supports/Treatments Recommendations For Services/Supports/Treatments: Individual Therapy, Medication Management  Treatment Plan Summary:  Referrals to Alternative Service(s): Referred to Alternative Service(s):   Place:    Date:   Time:    Referred to Alternative Service(s):   Place:   Date:   Time:    Referred to Alternative Service(s):   Place:   Date:   Time:    Referred to Alternative Service(s):   Place:   Date:   Time:     Vernona Rieger

## 2019-06-03 ENCOUNTER — Ambulatory Visit (HOSPITAL_COMMUNITY): Payer: No Typology Code available for payment source | Admitting: Licensed Clinical Social Worker

## 2019-06-03 ENCOUNTER — Other Ambulatory Visit: Payer: Self-pay

## 2019-06-09 ENCOUNTER — Ambulatory Visit (INDEPENDENT_AMBULATORY_CARE_PROVIDER_SITE_OTHER): Payer: No Typology Code available for payment source | Admitting: Family Medicine

## 2019-06-09 ENCOUNTER — Other Ambulatory Visit: Payer: Self-pay

## 2019-06-09 ENCOUNTER — Encounter: Payer: Self-pay | Admitting: Family Medicine

## 2019-06-09 VITALS — BP 120/82 | HR 75 | Wt 183.3 lb

## 2019-06-09 DIAGNOSIS — Z01411 Encounter for gynecological examination (general) (routine) with abnormal findings: Secondary | ICD-10-CM | POA: Diagnosis not present

## 2019-06-09 DIAGNOSIS — N930 Postcoital and contact bleeding: Secondary | ICD-10-CM | POA: Diagnosis not present

## 2019-06-09 DIAGNOSIS — Z8742 Personal history of other diseases of the female genital tract: Secondary | ICD-10-CM

## 2019-06-09 DIAGNOSIS — N888 Other specified noninflammatory disorders of cervix uteri: Secondary | ICD-10-CM | POA: Diagnosis not present

## 2019-06-09 DIAGNOSIS — Z124 Encounter for screening for malignant neoplasm of cervix: Secondary | ICD-10-CM

## 2019-06-09 DIAGNOSIS — N939 Abnormal uterine and vaginal bleeding, unspecified: Secondary | ICD-10-CM

## 2019-06-09 HISTORY — DX: Personal history of other diseases of the female genital tract: Z87.42

## 2019-06-09 NOTE — Progress Notes (Signed)
  Subjective:     Cindy Jones is a 29 y.o. female and is here for a comprehensive physical exam. The patient reports problems - postcoital bleeding and some lengthy period.  The following portions of the patient's history were reviewed and updated as appropriate: allergies, current medications, past family history, past medical history, past social history, past surgical history and problem list.  Review of Systems Pertinent items noted in HPI and remainder of comprehensive ROS otherwise negative.   Objective:    BP 120/82   Pulse 75   Wt 183 lb 4.8 oz (83.1 kg)   LMP 05/31/2019   BMI 30.04 kg/m  General appearance: alert, cooperative and appears stated age Head: Normocephalic, without obvious abnormality, atraumatic Neck: no adenopathy, supple, symmetrical, trachea midline and thyroid not enlarged, symmetric, no tenderness/mass/nodules Lungs: clear to auscultation bilaterally Breasts: normal appearance, no masses or tenderness Heart: regular rate and rhythm, S1, S2 normal, no murmur, click, rub or gallop Abdomen: soft, non-tender; bowel sounds normal; no masses,  no organomegaly Pelvic: external genitalia normal, no adnexal masses or tenderness, no cervical motion tenderness, uterus normal size, shape, and consistency, vagina normal without discharge and friable cervix Extremities: extremities normal, atraumatic, no cyanosis or edema Pulses: 2+ and symmetric Skin: Skin color, texture, turgor normal. No rashes or lesions Lymph nodes: Cervical, supraclavicular, and axillary nodes normal. Neurologic: Grossly normal    Assessment:    Healthy female exam.      Plan:   Problem List Items Addressed This Visit      Unprioritized   Friable cervix    Discussed cryo if needed.      History of acute PID    May want HSG--prior to trying to conceive       Other Visit Diagnoses    Pap smear for cervical cancer screening    -  Primary   Relevant Orders   Cytology - PAP( [facility])   Encounter for gynecological examination with abnormal finding       Abnormal uterine bleeding       Relevant Orders   US PELVIC COMPLETE WITH TRANSVAGINAL   TSH     Return in 1 year (on 06/08/2020).    See After Visit Summary for Counseling Recommendations

## 2019-06-09 NOTE — Patient Instructions (Signed)
 Preventive Care 21-29 Years Old, Female Preventive care refers to visits with your health care provider and lifestyle choices that can promote health and wellness. This includes:  A yearly physical exam. This may also be called an annual well check.  Regular dental visits and eye exams.  Immunizations.  Screening for certain conditions.  Healthy lifestyle choices, such as eating a healthy diet, getting regular exercise, not using drugs or products that contain nicotine and tobacco, and limiting alcohol use. What can I expect for my preventive care visit? Physical exam Your health care provider will check your:  Height and weight. This may be used to calculate body mass index (BMI), which tells if you are at a healthy weight.  Heart rate and blood pressure.  Skin for abnormal spots. Counseling Your health care provider may ask you questions about your:  Alcohol, tobacco, and drug use.  Emotional well-being.  Home and relationship well-being.  Sexual activity.  Eating habits.  Work and work environment.  Method of birth control.  Menstrual cycle.  Pregnancy history. What immunizations do I need?  Influenza (flu) vaccine  This is recommended every year. Tetanus, diphtheria, and pertussis (Tdap) vaccine  You may need a Td booster every 10 years. Varicella (chickenpox) vaccine  You may need this if you have not been vaccinated. Human papillomavirus (HPV) vaccine  If recommended by your health care provider, you may need three doses over 6 months. Measles, mumps, and rubella (MMR) vaccine  You may need at least one dose of MMR. You may also need a second dose. Meningococcal conjugate (MenACWY) vaccine  One dose is recommended if you are age 19-21 years and a first-year college student living in a residence hall, or if you have one of several medical conditions. You may also need additional booster doses. Pneumococcal conjugate (PCV13) vaccine  You may need  this if you have certain conditions and were not previously vaccinated. Pneumococcal polysaccharide (PPSV23) vaccine  You may need one or two doses if you smoke cigarettes or if you have certain conditions. Hepatitis A vaccine  You may need this if you have certain conditions or if you travel or work in places where you may be exposed to hepatitis A. Hepatitis B vaccine  You may need this if you have certain conditions or if you travel or work in places where you may be exposed to hepatitis B. Haemophilus influenzae type b (Hib) vaccine  You may need this if you have certain conditions. You may receive vaccines as individual doses or as more than one vaccine together in one shot (combination vaccines). Talk with your health care provider about the risks and benefits of combination vaccines. What tests do I need?  Blood tests  Lipid and cholesterol levels. These may be checked every 5 years starting at age 20.  Hepatitis C test.  Hepatitis B test. Screening  Diabetes screening. This is done by checking your blood sugar (glucose) after you have not eaten for a while (fasting).  Sexually transmitted disease (STD) testing.  BRCA-related cancer screening. This may be done if you have a family history of breast, ovarian, tubal, or peritoneal cancers.  Pelvic exam and Pap test. This may be done every 3 years starting at age 21. Starting at age 30, this may be done every 5 years if you have a Pap test in combination with an HPV test. Talk with your health care provider about your test results, treatment options, and if necessary, the need for more   tests. Follow these instructions at home: Eating and drinking   Eat a diet that includes fresh fruits and vegetables, whole grains, lean protein, and low-fat dairy.  Take vitamin and mineral supplements as recommended by your health care provider.  Do not drink alcohol if: ? Your health care provider tells you not to drink. ? You are  pregnant, may be pregnant, or are planning to become pregnant.  If you drink alcohol: ? Limit how much you have to 0-1 drink a day. ? Be aware of how much alcohol is in your drink. In the [place] one drink equals one 12 oz bottle of beer (355 mL), one 5 oz glass of wine (148 mL), or one 1 oz glass of hard liquor (44 mL). Lifestyle  Take daily care of your teeth and gums.  Stay active. Exercise for at least 30 minutes on 5 or more days each week.  Do not use any products that contain nicotine or tobacco, such as cigarettes, e-cigarettes, and chewing tobacco. If you need help quitting, ask your health care provider.  If you are sexually active, practice safe sex. Use a condom or other form of birth control (contraception) in order to prevent pregnancy and STIs (sexually transmitted infections). If you plan to become pregnant, see your health care provider for a preconception visit. What's next?  Visit your health care provider once a year for a well check visit.  Ask your health care provider how often you should have your eyes and teeth checked.  Stay up to date on all vaccines. This information is not intended to replace advice given to you by your health care provider. Make sure you discuss any questions you have with your health care provider. Document Revised: 12/27/2017 Document Reviewed: 12/27/2017 Elsevier Patient Education  2020 Elsevier Inc.  

## 2019-06-09 NOTE — Assessment & Plan Note (Signed)
Discussed cryo if needed.

## 2019-06-09 NOTE — Assessment & Plan Note (Signed)
May want HSG--prior to trying to conceive

## 2019-06-10 ENCOUNTER — Encounter (HOSPITAL_COMMUNITY): Payer: Self-pay | Admitting: Licensed Clinical Social Worker

## 2019-06-10 ENCOUNTER — Ambulatory Visit (INDEPENDENT_AMBULATORY_CARE_PROVIDER_SITE_OTHER): Payer: No Typology Code available for payment source | Admitting: Licensed Clinical Social Worker

## 2019-06-10 DIAGNOSIS — F411 Generalized anxiety disorder: Secondary | ICD-10-CM

## 2019-06-10 LAB — TSH: TSH: 1.43 u[IU]/mL (ref 0.450–4.500)

## 2019-06-10 NOTE — Progress Notes (Signed)
Virtual Visit via Video Note  I connected with Cindy Jones on 06/10/19 at 1:00pm by a video enabled telemedicine application and verified that I am speaking with the correct person using two identifiers.   I discussed the limitations of evaluation and management by telemedicine and the availability of in person appointments. The patient expressed understanding and agreed to proceed.  History of Present Illness: Pt was referred to therapy by her psychiatrist at [facility] for anxiety and panic attacks.   Participation Level: Active   Type of Therapy: Individual via Webex  Treatment Goals addressed: Improve psychiatric symptoms, Controlled Behavior, Moderated Mood, Improve Unhelpful Thought Patterns, Emotional Regulation Skills (Moderate moods, anger management, stress management), Feel and express a full Range of Emotions, Learn about Diagnosis, Healthy Coping Skills.  Interventions: CBT/Supportive/Psychoeducation  Summary: Cindy Jones is a 29 y.o. female who presents anxious for her webex family counseling session. Patient described her psychiatric symptoms and current life events. Patient described her current stressors and coping skills. Patient discussed her ACOA traits that are affecting her relationships (work and Copywriter, advertising). Educated patient on ACOA traits and healing. Discussed with patient future family therapy sessions.   Assessment and plan: Counselor will continue to meet with patient to address treatment plan goals. Patient will continue to follow recommendations of providers and implement skills learned in session.  Suicidal/Homicidal: Nowithout intent/plan  Therapist Response: Assessed pt's current functioning and reviewed progress.. Assisted pt processing stressors, negative thoughts.  Assisted pt processing for the management of her stressors.  Participation Level: Active  Diagnosis: Axis I.:  GAD    Follow Up Instructions:   I discussed the assessment and treatment plan  with the patient. The patient was provided an opportunity to ask questions and all were answered. The patient agreed with the plan and demonstrated an understanding of the instructions.   The patient was advised to call back or seek an in-person evaluation if the symptoms worsen or if the condition fails to improve as anticipated.  I provided 60 minutes of non-face-to-face time during this encounter.   [name], LCAS

## 2019-06-11 LAB — CYTOLOGY - PAP
Diagnosis: NEGATIVE
Diagnosis: REACTIVE

## 2019-06-12 MED FILL — DEXTROAMP-AMPHETAMIN 20 MG: 20 | 30 days supply | Qty: 30 | Fill #0

## 2019-06-12 MED FILL — ADDERALL XR 25 MG CAPSULE: 25 | 30 days supply | Qty: 30 | Fill #0

## 2019-06-16 ENCOUNTER — Ambulatory Visit (HOSPITAL_COMMUNITY)
Admission: RE | Admit: 2019-06-16 | Discharge: 2019-06-16 | Disposition: A | Discharge status: Home / Self Care | Payer: No Typology Code available for payment source | Source: Ambulatory Visit | Attending: Family Medicine | Admitting: Family Medicine

## 2019-06-16 ENCOUNTER — Other Ambulatory Visit: Payer: Self-pay

## 2019-06-16 DIAGNOSIS — N939 Abnormal uterine and vaginal bleeding, unspecified: Secondary | ICD-10-CM | POA: Insufficient documentation

## 2019-06-17 ENCOUNTER — Ambulatory Visit (INDEPENDENT_AMBULATORY_CARE_PROVIDER_SITE_OTHER): Payer: No Typology Code available for payment source | Admitting: Licensed Clinical Social Worker

## 2019-06-17 ENCOUNTER — Encounter (HOSPITAL_COMMUNITY): Payer: Self-pay | Admitting: Licensed Clinical Social Worker

## 2019-06-17 DIAGNOSIS — F411 Generalized anxiety disorder: Secondary | ICD-10-CM

## 2019-06-17 NOTE — Progress Notes (Signed)
Virtual Visit via Video Note  I connected with Cindy Jones on 06/17/19 at 1:00pm by a video enabled telemedicine application and verified that I am speaking with the correct person using two identifiers.   I discussed the limitations of evaluation and management by telemedicine and the availability of in person appointments. The patient expressed understanding and agreed to proceed.  History of Present Illness: Pt was referred to therapy by her psychiatrist at [facility] for anxiety and panic attacks.   Participation Level: Active   Type of Therapy: Individual via Webex  Treatment Goals addressed: Improve psychiatric symptoms, Controlled Behavior, Moderated Mood, Improve Unhelpful Thought Patterns, Emotional Regulation Skills (Moderate moods, anger management, stress management), Feel and express a full Range of Emotions, Learn about Diagnosis, Healthy Coping Skills.  Interventions: CBT/Supportive/Psychoeducation  Summary: Cindy Jones is a 29 y.o. female who presents anxious for her webex family counseling session. Patient described her psychiatric symptoms and current life events. Patient described her current stressors and coping skills. Patient visited family this weekend which brought up unresolved/generational/historic issues within her family system. Assessed family dynamics. Patient discussed her mother coming to another family session 3/3 to continue to work on their relationship.    Assessment and plan: Counselor will continue to meet with patient to address treatment plan goals. Patient will continue to follow recommendations of providers and implement skills learned in session.  Suicidal/Homicidal: Nowithout intent/plan  Therapist Response: Assessed pt's current functioning and reviewed progress.. Assisted pt processing stressors, negative thoughts.  Assisted pt processing for the management of her stressors.  Participation Level: Active  Diagnosis: Axis I.:  GAD    Follow Up  Instructions:   I discussed the assessment and treatment plan with the patient. The patient was provided an opportunity to ask questions and all were answered. The patient agreed with the plan and demonstrated an understanding of the instructions.   The patient was advised to call back or seek an in-person evaluation if the symptoms worsen or if the condition fails to improve as anticipated.  I provided 60 minutes of non-face-to-face time during this encounter.   Darcy Barbara S, LCAS

## 2019-06-24 ENCOUNTER — Encounter (HOSPITAL_COMMUNITY): Payer: Self-pay | Admitting: Licensed Clinical Social Worker

## 2019-06-24 ENCOUNTER — Ambulatory Visit (INDEPENDENT_AMBULATORY_CARE_PROVIDER_SITE_OTHER): Payer: No Typology Code available for payment source | Admitting: Licensed Clinical Social Worker

## 2019-06-24 ENCOUNTER — Other Ambulatory Visit: Payer: Self-pay

## 2019-06-24 DIAGNOSIS — F411 Generalized anxiety disorder: Secondary | ICD-10-CM

## 2019-06-24 NOTE — Progress Notes (Signed)
Virtual Visit via Video Note  I connected with [name] on 06/24/19 at 1:00pm by a video enabled telemedicine application and verified that I am speaking with the correct person using two identifiers.   I discussed the limitations of evaluation and management by telemedicine and the availability of in person appointments. The patient expressed understanding and agreed to proceed.  History of Present Illness: Pt was referred to therapy by her psychiatrist at [facility] for anxiety and panic attacks.   Participation Level: Active   Type of Therapy: Individual via Webex  Treatment Goals addressed: Improve psychiatric symptoms, Controlled Behavior, Moderated Mood, Improve Unhelpful Thought Patterns, Emotional Regulation Skills (Moderate moods, anger management, stress management), Feel and express a full Range of Emotions, Learn about Diagnosis, Healthy Coping Skills.  Interventions: CBT/Supportive/Psychoeducation  Summary: Cindy Jones is a 29 y.o. female who presents anxious for her webex family counseling session. Patient described her psychiatric symptoms and current life events. Patient described her current stressors and coping skills. Patient discussed her continued family issues with her mother. They are trying to resolve historical family issues. Discussed with patient the goals for the upcoming family session on 3/3. Provided skill building on suggested communication styles, problem solving methods to be used at upcoming family session.,    Assessment and plan: Counselor will continue to meet with patient to address treatment plan goals. Patient will continue to follow recommendations of providers and implement skills learned in session.  Suicidal/Homicidal: Nowithout intent/plan  Therapist Response: Assessed pt's current functioning and reviewed progress.. Assisted pt processing stressors, negative thoughts.  Assisted pt processing for the management of her stressors.  Participation  Level: Active  Diagnosis: Axis I.:  GAD    Follow Up Instructions:   I discussed the assessment and treatment plan with the patient. The patient was provided an opportunity to ask questions and all were answered. The patient agreed with the plan and demonstrated an understanding of the instructions.   The patient was advised to call back or seek an in-person evaluation if the symptoms worsen or if the condition fails to improve as anticipated.  I provided 60 minutes of non-face-to-face time during this encounter.   , S, LCAS

## 2019-07-02 ENCOUNTER — Ambulatory Visit (INDEPENDENT_AMBULATORY_CARE_PROVIDER_SITE_OTHER): Payer: No Typology Code available for payment source | Admitting: Licensed Clinical Social Worker

## 2019-07-02 ENCOUNTER — Other Ambulatory Visit: Payer: Self-pay

## 2019-07-02 ENCOUNTER — Encounter (HOSPITAL_COMMUNITY): Payer: Self-pay | Admitting: Licensed Clinical Social Worker

## 2019-07-02 DIAGNOSIS — F411 Generalized anxiety disorder: Secondary | ICD-10-CM | POA: Diagnosis not present

## 2019-07-02 NOTE — Progress Notes (Signed)
Virtual Visit via Video Note  I connected with Cindy Jones on 07/02/19 at 9:00am by a video enabled telemedicine application and verified that I am speaking with the correct person using two identifiers.   I discussed the limitations of evaluation and management by telemedicine and the availability of in person appointments. The patient expressed understanding and agreed to proceed.  History of Present Illness: Pt was referred to therapy by her psychiatrist at [facility] for anxiety and panic attacks.   Participation Level: Active   Type of Therapy: Individual via Webex  Treatment Goals addressed: Improve psychiatric symptoms, Controlled Behavior, Moderated Mood, Improve Unhelpful Thought Patterns, Emotional Regulation Skills (Moderate moods, anger management, stress management), Feel and express a full Range of Emotions, Learn about Diagnosis, Healthy Coping Skills.  Interventions: CBT/Supportive/Psychoeducation  Summary: Cindy Jones is a 29 y.o. female who presents anxious for her webex family counseling session, with her mother present.Gave patient and her mother a platform to talk to each other while both parties feel safe to speak their truth and be heard. Encouraged and made suggestions and inquires to stimulate both parties opening up and being honest. Providing validation of feelings. Encouraged steps for future communication between the two. Educated both on effective communication skills and expectations.   Assessment and plan: Counselor will continue to meet with patient to address treatment plan goals. Patient will continue to follow recommendations of providers and implement skills learned in session.  Suicidal/Homicidal: Nowithout intent/plan  Therapist Response: Assessed pt's current functioning and reviewed progress.. Assisted pt processing stressors, negative thoughts.  Assisted pt processing for the management of her stressors.  Participation Level: Active  Diagnosis: Axis  I.:  GAD    Follow Up Instructions:   I discussed the assessment and treatment plan with the patient. The patient was provided an opportunity to ask questions and all were answered. The patient agreed with the plan and demonstrated an understanding of the instructions.   The patient was advised to call back or seek an in-person evaluation if the symptoms worsen or if the condition fails to improve as anticipated.  I provided 60 minutes of non-face-to-face time during this encounter.   Doniel Maiello S, LCAS

## 2019-07-03 ENCOUNTER — Other Ambulatory Visit: Payer: Self-pay

## 2019-07-03 ENCOUNTER — Encounter (HOSPITAL_COMMUNITY): Payer: Self-pay | Admitting: Psychiatry

## 2019-07-03 ENCOUNTER — Ambulatory Visit (INDEPENDENT_AMBULATORY_CARE_PROVIDER_SITE_OTHER): Payer: No Typology Code available for payment source | Admitting: Psychiatry

## 2019-07-03 DIAGNOSIS — F411 Generalized anxiety disorder: Secondary | ICD-10-CM

## 2019-07-03 DIAGNOSIS — F419 Anxiety disorder, unspecified: Secondary | ICD-10-CM

## 2019-07-03 DIAGNOSIS — F900 Attention-deficit hyperactivity disorder, predominantly inattentive type: Secondary | ICD-10-CM

## 2019-07-03 DIAGNOSIS — F952 Tourette's disorder: Secondary | ICD-10-CM | POA: Diagnosis not present

## 2019-07-03 DIAGNOSIS — F502 Bulimia nervosa: Secondary | ICD-10-CM

## 2019-07-03 DIAGNOSIS — F410 Panic disorder [episodic paroxysmal anxiety] without agoraphobia: Secondary | ICD-10-CM | POA: Diagnosis not present

## 2019-07-03 DIAGNOSIS — F988 Other specified behavioral and emotional disorders with onset usually occurring in childhood and adolescence: Secondary | ICD-10-CM

## 2019-07-03 DIAGNOSIS — F325 Major depressive disorder, single episode, in full remission: Secondary | ICD-10-CM

## 2019-07-03 MED ORDER — AMPHETAMINE-DEXTROAMPHETAMINE 20 MG PO TABS: 20.0000 mg | ORAL_TABLET | Freq: Every day | ORAL | 0 refills | Status: DC | PRN

## 2019-07-03 MED ORDER — AMPHETAMINE-DEXTROAMPHET ER 25 MG PO CP24: 25.0000 mg | ORAL_CAPSULE | Freq: Every day | ORAL | 0 refills | Status: DC

## 2019-07-03 MED ORDER — AMPHETAMINE-DEXTROAMPHET ER 25 MG PO CP24: 25.0000 mg | ORAL_CAPSULE | ORAL | 0 refills | Status: DC

## 2019-07-03 MED ORDER — AMPHETAMINE-DEXTROAMPHETAMINE 20 MG PO TABS: 20.0000 mg | ORAL_TABLET | Freq: Every day | ORAL | 0 refills | Status: DC

## 2019-07-03 NOTE — Progress Notes (Signed)
Virtual Visit via Telephone Note  I connected with Cindy Jones on 07/03/19 at  9:15 AM EST by telephone and verified that I am speaking with the correct person using two identifiers.  Location: Patient: home Provider: office   I discussed the limitations, risks, security and privacy concerns of performing an evaluation and management service by telephone and the availability of in person appointments. I also discussed with the patient that there may be a patient responsible charge related to this service. The patient expressed understanding and agreed to proceed.   History of Present Illness: Ileta is doing better. She had a number of stressors but things seem to be tolerable. Her anxiety is manageable. She is able to go out to do errands or classes but not for other tasks without forcing herself. Araiya denies any recent stress induced panic attacks. On days she has school her Tourette's is more prominent and it is likely due to long school days. She tried taking the IR Adderall in the AM and then XR in the late morning for 1 week. It was not working because she would forget the late morning dose or have insomnia. She is back to taking the XR dose in the AM. Precilla is having trouble with focus at work and thinks it has to do with working from home. She is able to focus better at school. Keliah had a hard time with food during the holidays but states it is always the case. She thinks it was due to stress. She denies an binging and purging episodes. Shelle is sleeping better. She is working out 30-60 min a few days a week. She denies depression and SI/HI. Jassica has a few days a month where she feels down and unmotivated. It is usually when her husband is at work.   Observations/Objective:  General Appearance: unable to assess  Eye Contact:  unable to assess  Speech:  Clear and Coherent and Normal Rate  Volume:  Normal  Mood:  Euthymic  Affect:  Full Range  Thought Process:  Coherent and  Descriptions of Associations: Circumstantial  Orientation:  Full (Time, Place, and Person)  Thought Content:  Logical  Suicidal Thoughts:  No  Homicidal Thoughts:  No  Memory:  Immediate;   Good  Judgement:  Good  Insight:  Good  Psychomotor Activity: unable to assess  Concentration:  Concentration: Good  Recall:  Good  Fund of Knowledge:  Good  Language:  Good  Akathisia:  unable to assess  Handed:  Right  AIMS (if indicated):     Assets:  Communication Skills Desire for Improvement Financial Resources/Insurance Housing Intimacy Leisure Time Physical Health Resilience Social Support Talents/Skills Transportation Vocational/Educational  ADL's:  unable to assess  Cognition:  WNL  Sleep:       I reviewed the information below on 07/03/2019 and have updated it Assessment and Plan:  ADHD-inattentive type; Tourette's d/o; Panic attacks; Bulimia Nervosa; MDD- in remission   Status of current symptoms: stable. Working on her ADHD symptoms   Adderall XR 25mg  po qAM for ADHD.   Adderall 20mg  po qD prn ADHD for late afternoon for work days  Encouraged to continue therapy  Follow Up Instructions: In 3 months or sooner if needed   I discussed the assessment and treatment plan with the patient. The patient was provided an opportunity to ask questions and all were answered. The patient agreed with the plan and demonstrated an understanding of the instructions.   The patient was advised to  call back or seek an in-person evaluation if the symptoms worsen or if the condition fails to improve as anticipated.  I provided 25 minutes of non-face-to-face time during this encounter.   Charlcie Cradle, MD

## 2019-07-08 ENCOUNTER — Other Ambulatory Visit: Payer: Self-pay

## 2019-07-08 ENCOUNTER — Encounter (HOSPITAL_COMMUNITY): Payer: Self-pay | Admitting: Licensed Clinical Social Worker

## 2019-07-08 ENCOUNTER — Ambulatory Visit (INDEPENDENT_AMBULATORY_CARE_PROVIDER_SITE_OTHER): Payer: No Typology Code available for payment source | Admitting: Licensed Clinical Social Worker

## 2019-07-08 DIAGNOSIS — F411 Generalized anxiety disorder: Secondary | ICD-10-CM

## 2019-07-08 NOTE — Progress Notes (Signed)
Virtual Visit via Video Note  I connected with Cindy Jones on 07/08/19 at 1:00pm by a video enabled telemedicine application and verified that I am speaking with the correct person using two identifiers.   I discussed the limitations of evaluation and management by telemedicine and the availability of in person appointments. The patient expressed understanding and agreed to proceed.  History of Present Illness: Pt was referred to therapy by her psychiatrist at [facility] for anxiety and panic attacks.   Participation Level: Active   Type of Therapy: Individual via Webex  Treatment Goals addressed: Improve psychiatric symptoms, Controlled Behavior, Moderated Mood, Improve Unhelpful Thought Patterns, Emotional Regulation Skills (Moderate moods, anger management, stress management), Feel and express a full Range of Emotions, Learn about Diagnosis, Healthy Coping Skills.  Interventions: CBT/Supportive/Psychoeducation  Summary: Cindy Jones is a 29 y.o. female who presents anxious for her webex counseling session. Patient described her psychiatric symptoms and current life events. Patient discussed her stressors and current coping skills. Patient and her husband have been struggling with disagreements in public social situations. Coached patient on fair fighting rules and effective communication skills. Patient asked her husband to join the session where a platform was provided for patient could talk to each other using I statements and fair fighting rules.  Assessment and plan: Counselor will continue to meet with patient to address treatment plan goals. Patient will continue to follow recommendations of providers and implement skills learned in session.  Suicidal/Homicidal: Nowithout intent/plan  Therapist Response: Assessed pt's current functioning and reviewed progress.. Assisted pt processing stressors, negative thoughts.  Assisted pt processing for the management of her  stressors.  Participation Level: Active  Diagnosis: Axis I.:  GAD    Follow Up Instructions:   I discussed the assessment and treatment plan with the patient. The patient was provided an opportunity to ask questions and all were answered. The patient agreed with the plan and demonstrated an understanding of the instructions.   The patient was advised to call back or seek an in-person evaluation if the symptoms worsen or if the condition fails to improve as anticipated.  I provided 60 minutes of non-face-to-face time during this encounter.   Ashanna Heinsohn S, LCAS

## 2019-07-10 MED FILL — AMPHETAMINE-DEXTROAMPHETAMI: 20 | 30 days supply | Qty: 30 | Fill #0

## 2019-07-10 MED FILL — ADDERALL XR 25 MG CAPSULE: 25 | 30 days supply | Qty: 30 | Fill #0

## 2019-07-15 ENCOUNTER — Encounter (HOSPITAL_COMMUNITY): Payer: Self-pay | Admitting: Licensed Clinical Social Worker

## 2019-07-15 ENCOUNTER — Other Ambulatory Visit: Payer: Self-pay

## 2019-07-15 ENCOUNTER — Ambulatory Visit (INDEPENDENT_AMBULATORY_CARE_PROVIDER_SITE_OTHER): Payer: No Typology Code available for payment source | Admitting: Licensed Clinical Social Worker

## 2019-07-15 DIAGNOSIS — F411 Generalized anxiety disorder: Secondary | ICD-10-CM | POA: Diagnosis not present

## 2019-07-15 NOTE — Progress Notes (Signed)
Virtual Visit via Video Note  I connected with Cindy Jones on 07/15/19 at 1:00pm EDT by a video enabled telemedicine application and verified that I am speaking with the correct person using two identifiers.   I discussed the limitations of evaluation and management by telemedicine and the availability of in person appointments. The patient expressed understanding and agreed to proceed.  History of Present Illness: Pt was referred to therapy by her psychiatrist at [facility] for anxiety and panic attacks.   Participation Level: Active   Type of Therapy: Individual via Webex  Treatment Goals addressed: Improve psychiatric symptoms, Controlled Behavior, Moderated Mood, Improve Unhelpful Thought Patterns, Emotional Regulation Skills (Moderate moods, anger management, stress management), Feel and express a full Range of Emotions, Learn about Diagnosis, Healthy Coping Skills.  Interventions: CBT/Supportive/Psychoeducation  Summary: Cindy Jones is a 29 y.o. female who presents anxious for her webex counseling session. Patient described her psychiatric symptoms and current life events. Patient discussed her stressors and current coping skills.  Reviewed tx plan with patient who verbalized acceptance of the plan. Patient  Discussed her relationship with her mother. Used CBT for her family interactions by teaching her skills to improve positive family relations and reduce family conflict. Patient's mother will join at next session. Discussed goals for next family session.     Assessment and plan: Counselor will continue to meet with patient to address treatment plan goals. Patient will continue to follow recommendations of providers and implement skills learned in session.  Suicidal/Homicidal: Nowithout intent/plan  Therapist Response: Assessed pt's current functioning and reviewed progress.. Assisted pt processing stressors, negative thoughts.  Assisted pt processing for the management of her  stressors.  Participation Level: Active  Diagnosis: Axis I.:  GAD    Follow Up Instructions:   I discussed the assessment and treatment plan with the patient. The patient was provided an opportunity to ask questions and all were answered. The patient agreed with the plan and demonstrated an understanding of the instructions.   The patient was advised to call back or seek an in-person evaluation if the symptoms worsen or if the condition fails to improve as anticipated.  I provided 60 minutes of non-face-to-face time during this encounter.   Rinda Rollyson S, LCAS

## 2019-07-18 MED FILL — DOXYCYCLINE HYC 100 MG CAPS: 100 | 7 days supply | Qty: 14 | Fill #0

## 2019-07-18 MED FILL — IBUPROFEN 600 MG TABLET: 600 | 5 days supply | Qty: 20 | Fill #0

## 2019-07-18 MED FILL — CHLORHEXIDINE 0.12% RINSE: 0.12 | 16 days supply | Qty: 473 | Fill #0

## 2019-07-18 MED FILL — PENICILLIN VK 500 MG TABLET: 500 | 14 days supply | Qty: 56 | Fill #0

## 2019-07-22 ENCOUNTER — Other Ambulatory Visit: Payer: Self-pay

## 2019-07-22 ENCOUNTER — Encounter (HOSPITAL_COMMUNITY): Payer: Self-pay | Admitting: Licensed Clinical Social Worker

## 2019-07-22 ENCOUNTER — Ambulatory Visit (INDEPENDENT_AMBULATORY_CARE_PROVIDER_SITE_OTHER): Payer: No Typology Code available for payment source | Admitting: Licensed Clinical Social Worker

## 2019-07-22 DIAGNOSIS — F411 Generalized anxiety disorder: Secondary | ICD-10-CM

## 2019-07-22 NOTE — Progress Notes (Signed)
Virtual Visit via Video Note  I connected with Cindy Jones on 07/15/19 at 1:00pm EDT by a video enabled telemedicine application and verified that I am speaking with the correct person using two identifiers.   I discussed the limitations of evaluation and management by telemedicine and the availability of in person appointments. The patient expressed understanding and agreed to proceed.  History of Present Illness: Pt was referred to therapy by her psychiatrist at [facility] for anxiety and panic attacks.   Participation Level: Active   Type of Therapy: Individual via Webex  Treatment Goals addressed: Improve psychiatric symptoms, Controlled Behavior, Moderated Mood, Improve Unhelpful Thought Patterns, Emotional Regulation Skills (Moderate moods, anger management, stress management), Feel and express a full Range of Emotions, Learn about Diagnosis, Healthy Coping Skills.  Interventions: CBT/Supportive/Psychoeducation  Summary: Cindy Jones is a 29 y.o. female who presents anxious and frustrated for her webex counseling session. Patient described her psychiatric symptoms and current life events. Patient discussed her stressors and current coping skills. Patient discussed her communication errors with her husband she is experiencing. "He thinks I'm bossing him around." Educated patient on Active listening and assertive communication. Asked open ended questions: "Do you think you're you're bossy?" Assignment: Ask your 2 best friends if they think you are bossy. Suggested to patient use effective communication skills and problem solving.  PLAN: Family session next week     Assessment and plan: Counselor will continue to meet with patient to address treatment plan goals. Patient will continue to follow recommendations of providers and implement skills learned in session.  Suicidal/Homicidal: Nowithout intent/plan  Therapist Response: Assessed pt's current functioning and reviewed progress..  Assisted pt processing stressors, negative thoughts.  Assisted pt processing for the management of her stressors.  Participation Level: Active  Diagnosis: Axis I.:  GAD    Follow Up Instructions:   I discussed the assessment and treatment plan with the patient. The patient was provided an opportunity to ask questions and all were answered. The patient agreed with the plan and demonstrated an understanding of the instructions.   The patient was advised to call back or seek an in-person evaluation if the symptoms worsen or if the condition fails to improve as anticipated.  I provided 60 minutes of non-face-to-face time during this encounter.   Anddy Wingert S, LCAS

## 2019-07-29 ENCOUNTER — Other Ambulatory Visit: Payer: Self-pay

## 2019-07-29 ENCOUNTER — Encounter (HOSPITAL_COMMUNITY): Payer: Self-pay | Admitting: Licensed Clinical Social Worker

## 2019-07-29 ENCOUNTER — Ambulatory Visit (INDEPENDENT_AMBULATORY_CARE_PROVIDER_SITE_OTHER): Payer: No Typology Code available for payment source | Admitting: Licensed Clinical Social Worker

## 2019-07-29 DIAGNOSIS — F411 Generalized anxiety disorder: Secondary | ICD-10-CM

## 2019-07-29 NOTE — Progress Notes (Signed)
Virtual Visit via Video Note  I connected with Cindy Jones on 07/15/19 at 1:00pm EDT by a video enabled telemedicine application and verified that I am speaking with the correct person using two identifiers.   I discussed the limitations of evaluation and management by telemedicine and the availability of in person appointments. The patient expressed understanding and agreed to proceed.  History of Present Illness: Pt was referred to therapy by her psychiatrist at [facility] for anxiety and panic attacks.   Participation Level: Active   Type of Therapy: Individual via Webex  Treatment Goals addressed: Improve psychiatric symptoms, Controlled Behavior, Moderated Mood, Improve Unhelpful Thought Patterns, Emotional Regulation Skills (Moderate moods, anger management, stress management), Feel and express a full Range of Emotions, Learn about Diagnosis, Healthy Coping Skills.  Interventions: CBT/Supportive/Psychoeducation  Summary: Cindy Jones is a 29 y.o. female who presents anxious for her webex counseling session. Patient described her psychiatric symptoms and current life events. Patient discussed her stressors and current coping skills. Patient continues describing her communication errors with her husband she is experiencing. "He thinks I'm bossy." Explored Active listening and assertive communication. Asked open ended questions: Patient completed her homework assignment by asking her friends if she is bossy. Used socratic questions.  Plan: job interview      Assessment and plan: Counselor will continue to meet with patient to address treatment plan goals. Patient will continue to follow recommendations of providers and implement skills learned in session.  Suicidal/Homicidal: Nowithout intent/plan  Therapist Response: Assessed pt's current functioning and reviewed progress.. Assisted pt processing stressors, negative thoughts.  Assisted pt processing for the management of her  stressors.  Participation Level: Active  Diagnosis: Axis I.:  GAD    Follow Up Instructions:   I discussed the assessment and treatment plan with the patient. The patient was provided an opportunity to ask questions and all were answered. The patient agreed with the plan and demonstrated an understanding of the instructions.   The patient was advised to call back or seek an in-person evaluation if the symptoms worsen or if the condition fails to improve as anticipated.  I provided 60 minutes of non-face-to-face time during this encounter.   Hung Rhinesmith S, LCAS

## 2019-08-06 ENCOUNTER — Other Ambulatory Visit (HOSPITAL_COMMUNITY): Payer: Self-pay | Admitting: Psychiatry

## 2019-08-06 ENCOUNTER — Other Ambulatory Visit: Payer: Self-pay

## 2019-08-06 ENCOUNTER — Encounter (HOSPITAL_COMMUNITY): Payer: Self-pay | Admitting: Licensed Clinical Social Worker

## 2019-08-06 ENCOUNTER — Ambulatory Visit (INDEPENDENT_AMBULATORY_CARE_PROVIDER_SITE_OTHER): Payer: No Typology Code available for payment source | Admitting: Licensed Clinical Social Worker

## 2019-08-06 DIAGNOSIS — F411 Generalized anxiety disorder: Secondary | ICD-10-CM | POA: Diagnosis not present

## 2019-08-06 NOTE — Progress Notes (Signed)
Virtual Visit via Video Note  I connected with Cindy Jones on 08/06/19 at 1:00pm EDT by a video enabled telemedicine application and verified that I am speaking with the correct person using two identifiers.   I discussed the limitations of evaluation and management by telemedicine and the availability of in person appointments. The patient expressed understanding and agreed to proceed.  History of Present Illness: Pt was referred to therapy by her psychiatrist at [facility] for anxiety and panic attacks.   Participation Level: Active   Type of Therapy: Individual via Webex  Treatment Goals addressed: Improve psychiatric symptoms, Controlled Behavior, Moderated Mood, Improve Unhelpful Thought Patterns, Emotional Regulation Skills (Moderate moods, anger management, stress management), Feel and express a full Range of Emotions, Learn about Diagnosis, Healthy Coping Skills.  Interventions: CBT/Supportive/Psychoeducation  Summary: Cindy Jones is a 29 y.o. female who presents anxious for her webex family counseling session. Patient's mother joined the session today. Provided a platform for patient and her mother to talk to each other about patient's childhood,  mother's addiction, boundaries for building a mother/daughter relationship. Challenged them to use active listening and effective communication skills.  Assessment and plan: Counselor will continue to meet with patient to address treatment plan goals. Patient will continue to follow recommendations of providers and implement skills learned in session.  Suicidal/Homicidal: Nowithout intent/plan  Therapist Response: Assessed pt's current functioning and reviewed progress.. Assisted pt processing stressors, negative thoughts.  Assisted pt processing for the management of her stressors.  Participation Level: Active  Diagnosis: Axis I.:  GAD    Follow Up Instructions: I discussed the assessment and treatment plan with the patient. The  patient was provided an opportunity to ask questions and all were answered. The patient agreed with the plan and demonstrated an understanding of the instructions.   The patient was advised to call back or seek an in-person evaluation if the symptoms worsen or if the condition fails to improve as anticipated.  I provided 60 minutes of non-face-to-face time during this encounter.   Milia Warth S, LCAS

## 2019-08-12 MED FILL — ADDERALL XR 25 MG CAPSULE: 25 | 30 days supply | Qty: 30 | Fill #0

## 2019-08-13 ENCOUNTER — Encounter (HOSPITAL_COMMUNITY): Payer: Self-pay | Admitting: Licensed Clinical Social Worker

## 2019-08-13 ENCOUNTER — Ambulatory Visit (INDEPENDENT_AMBULATORY_CARE_PROVIDER_SITE_OTHER): Payer: No Typology Code available for payment source | Admitting: Licensed Clinical Social Worker

## 2019-08-13 ENCOUNTER — Other Ambulatory Visit: Payer: Self-pay

## 2019-08-13 DIAGNOSIS — F411 Generalized anxiety disorder: Secondary | ICD-10-CM

## 2019-08-13 NOTE — Progress Notes (Signed)
Virtual Visit via Video Note  I connected with Cindy Jones on 08/13/19 at 1:00pm EDT by a video enabled telemedicine application and verified that I am speaking with the correct person using two identifiers.   I discussed the limitations of evaluation and management by telemedicine and the availability of in person appointments. The patient expressed understanding and agreed to proceed.  History of Present Illness: Pt was referred to therapy by her psychiatrist at [facility] for anxiety and panic attacks.   Participation Level: Active   Type of Therapy: Individual via Webex  Treatment Goals addressed: Improve psychiatric symptoms, Controlled Behavior, Moderated Mood, Improve Unhelpful Thought Patterns, Emotional Regulation Skills (Moderate moods, anger management, stress management), Feel and express a full Range of Emotions, Learn about Diagnosis, Healthy Coping Skills.  Interventions: CBT/Supportive/Psychoeducation  Summary: Cindy Jones is a 29 y.o. female who presents anxious for her individual counseling session. Patient discussed her psychiatric symptoms and current life events. Pt discussed her current stressors:  school, work, family relationship (mother). Cln provided psychoeducation on stress where a strong support network composed of trusted family, friends, or community members can increase her resilience in times of stress. Used MI to assist patient with compartmentalizing her stressors.    Assessment and plan: Counselor will continue to meet with patient to address treatment plan goals. Patient will continue to follow recommendations of providers and implement skills learned in session.  Suicidal/Homicidal: Nowithout intent/plan  Therapist Response: Assessed pt's current functioning and reviewed progress.. Assisted pt processing stressors, negative thoughts.  Assisted pt processing for the management of her stressors.  Participation Level: Active  Diagnosis: Axis I.:  GAD    Follow Up Instructions: I discussed the assessment and treatment plan with the patient. The patient was provided an opportunity to ask questions and all were answered. The patient agreed with the plan and demonstrated an understanding of the instructions.   The patient was advised to call back or seek an in-person evaluation if the symptoms worsen or if the condition fails to improve as anticipated.  I provided 60 minutes of non-face-to-face time during this encounter.   Urbano Milhouse S, LCAS

## 2019-08-20 ENCOUNTER — Other Ambulatory Visit: Payer: Self-pay

## 2019-08-20 ENCOUNTER — Ambulatory Visit (HOSPITAL_COMMUNITY): Payer: No Typology Code available for payment source | Admitting: Licensed Clinical Social Worker

## 2019-08-27 ENCOUNTER — Other Ambulatory Visit: Payer: Self-pay

## 2019-08-27 ENCOUNTER — Encounter (HOSPITAL_COMMUNITY): Payer: Self-pay | Admitting: Licensed Clinical Social Worker

## 2019-08-27 ENCOUNTER — Ambulatory Visit (INDEPENDENT_AMBULATORY_CARE_PROVIDER_SITE_OTHER): Payer: No Typology Code available for payment source | Admitting: Licensed Clinical Social Worker

## 2019-08-27 DIAGNOSIS — F411 Generalized anxiety disorder: Secondary | ICD-10-CM

## 2019-08-27 NOTE — Progress Notes (Signed)
Virtual Visit via Video Note  I connected with Cindy Jones on 08/27/19 at 1:00pm EDT by a video enabled telemedicine application and verified that I am speaking with the correct person using two identifiers.   I discussed the limitations of evaluation and management by telemedicine and the availability of in person appointments. The patient expressed understanding and agreed to proceed.  History of Present Illness: Pt was referred to therapy by her psychiatrist at [facility] for anxiety and panic attacks.   Participation Level: Active   Type of Therapy: Individual via Webex  Treatment Goals addressed: Improve psychiatric symptoms, Controlled Behavior, Moderated Mood, Improve Unhelpful Thought Patterns, Emotional Regulation Skills (Moderate moods, anger management, stress management), Feel and express a full Range of Emotions, Learn about Diagnosis, Healthy Coping Skills.  Interventions: CBT/Supportive/Psychoeducation  Summary: Cindy Jones is a 29 y.o. female who presents anxious for her individual counseling session. Patient discussed her psychiatric symptoms and current life events. Pt discussed her current stressors:  school, work, family relationship (mother). Cln provided psychoeducation on effective coping skills for current stressors, using boundaries with her mother.. Pt discussed a job interview where she feels hopeful. Patient was able to self identify her positive qualifications, which assisted in her self affirmations. Validated her self affirmations. Patient joined a Psychologist, clinical. Clinician reflected the value of having shared experiences and developing friendships.     Assessment and plan: Counselor will continue to meet with patient to address treatment plan goals. Patient will continue to follow recommendations of providers and implement skills learned in session.  Suicidal/Homicidal: Nowithout intent/plan  Therapist Response: Assessed pt's current functioning and reviewed  progress.. Assisted pt processing stressors, negative thoughts.  Assisted pt processing for the management of her stressors.  Participation Level: Active  Diagnosis: Axis I.:  GAD    Follow Up Instructions: I discussed the assessment and treatment plan with the patient. The patient was provided an opportunity to ask questions and all were answered. The patient agreed with the plan and demonstrated an understanding of the instructions.   The patient was advised to call back or seek an in-person evaluation if the symptoms worsen or if the condition fails to improve as anticipated.  I provided 60 minutes of non-face-to-face time during this encounter.   Ivan Lacher S, LCAS

## 2019-09-03 ENCOUNTER — Ambulatory Visit (HOSPITAL_COMMUNITY): Payer: No Typology Code available for payment source | Admitting: Licensed Clinical Social Worker

## 2019-09-10 ENCOUNTER — Ambulatory Visit (INDEPENDENT_AMBULATORY_CARE_PROVIDER_SITE_OTHER): Payer: No Typology Code available for payment source | Admitting: Licensed Clinical Social Worker

## 2019-09-10 ENCOUNTER — Other Ambulatory Visit: Payer: Self-pay

## 2019-09-10 ENCOUNTER — Encounter (HOSPITAL_COMMUNITY): Payer: Self-pay | Admitting: Licensed Clinical Social Worker

## 2019-09-10 DIAGNOSIS — F411 Generalized anxiety disorder: Secondary | ICD-10-CM | POA: Diagnosis not present

## 2019-09-10 NOTE — Progress Notes (Signed)
Virtual Visit via Video Note  I connected with Cindy Jones on 09/10/19 at 1:00pm EDT by a video enabled telemedicine application and verified that I am speaking with the correct person using two identifiers.   I discussed the limitations of evaluation and management by telemedicine and the availability of in person appointments. The patient expressed understanding and agreed to proceed.  History of Present Illness: Pt was referred to therapy by her psychiatrist at [facility] for anxiety and panic attacks.   Participation Level: Active   Type of Therapy: Virtual individual therapy  Treatment Goals addressed: Improve psychiatric symptoms, Controlled Behavior, Moderated Mood, Improve Unhelpful Thought Patterns, Emotional Regulation Skills (Moderate moods, anger management, stress management), Feel and express a full Range of Emotions, Learn about Diagnosis, Healthy Coping Skills.  Interventions: CBT/Supportive/Psychoeducation  Summary: Cindy Jones is a 29 y.o. female who presents anxious for her individual counseling session. Patient discussed her psychiatric symptoms and current life events. Cln provided psychoeducation on effective coping skills for current stressors. Patient's husband joined session at patient's request to discuss communication. They just returned from holiday with her aunt and uncle (who she calls her parents) and there were come communication difficulties, especially when patient was experiencing anxiety. Provided a platform for patient and her husband to talk to each other in a safe environment. Cln interjected reminders, provided support, asked open ended questions, used reframing and , redirecting. The communication between patient and her husband turned out well and both admitted they felt heard.    Plan: running club     Assessment and plan: Counselor will continue to meet with patient to address treatment plan goals. Patient will continue to follow recommendations of  providers and implement skills learned in session.  Suicidal/Homicidal: Nowithout intent/plan  Therapist Response: Assessed pt's current functioning and reviewed progress.. Assisted pt processing stressors, negative thoughts.  Assisted pt processing for the management of her stressors.  Participation Level: Active  Diagnosis: Axis I.:  GAD    Follow Up Instructions: I discussed the assessment and treatment plan with the patient. The patient was provided an opportunity to ask questions and all were answered. The patient agreed with the plan and demonstrated an understanding of the instructions.   The patient was advised to call back or seek an in-person evaluation if the symptoms worsen or if the condition fails to improve as anticipated.  I provided 60 minutes of non-face-to-face time during this encounter.   Brevyn Ring S, LCAS

## 2019-09-17 ENCOUNTER — Encounter (HOSPITAL_COMMUNITY): Payer: Self-pay | Admitting: Licensed Clinical Social Worker

## 2019-09-17 ENCOUNTER — Ambulatory Visit (INDEPENDENT_AMBULATORY_CARE_PROVIDER_SITE_OTHER): Payer: No Typology Code available for payment source | Admitting: Licensed Clinical Social Worker

## 2019-09-17 ENCOUNTER — Other Ambulatory Visit: Payer: Self-pay

## 2019-09-17 DIAGNOSIS — F411 Generalized anxiety disorder: Secondary | ICD-10-CM | POA: Diagnosis not present

## 2019-09-17 NOTE — Progress Notes (Signed)
Virtual Visit via Video Note  I connected with Cindy Jones on 09/10/19 at 1:00pm EDT by a video enabled telemedicine application and verified that I am speaking with the correct person using two identifiers.   I discussed the limitations of evaluation and management by telemedicine and the availability of in person appointments. The patient expressed understanding and agreed to proceed.  History of Present Illness: Pt was referred to therapy by her psychiatrist at [facility] for anxiety and panic attacks.   Participation Level: Active   Type of Therapy: Virtual individual therapy  Treatment Goals addressed: Improve psychiatric symptoms, Controlled Behavior, Moderated Mood, Improve Unhelpful Thought Patterns, Emotional Regulation Skills (Moderate moods, anger management, stress management), Feel and express a full Range of Emotions, Learn about Diagnosis, Healthy Coping Skills.  Interventions: CBT/Supportive/Psychoeducation  Summary: Cindy Jones is a 29 y.o. female who presents anxious for her virtual individual counseling session. Patient discussed her psychiatric symptoms and current life events. Cln provided psychoeducation on effective coping skills for current stressors. Patient discussed a relationship she is struggling with. Provided psychoeducation on dynamics of healthy vs unhealthy relationships associated with power and control. Patient's family dog passed away and she is struggling with her grief. Gave patient information about Hospice and grief group for pet owners. Cln provided psychoeducation on grief process.     Plan: running club  Assessment and plan: Counselor will continue to meet with patient to address treatment plan goals. Patient will continue to follow recommendations of providers and implement skills learned in session.  Suicidal/Homicidal: Nowithout intent/plan  Therapist Response: Assessed pt's current functioning and reviewed progress.. Assisted pt processing  stressors, negative thoughts.  Assisted pt processing for the management of her stressors.  Participation Level: Active  Diagnosis: Axis I.:  GAD    Follow Up Instructions: I discussed the assessment and treatment plan with the patient. The patient was provided an opportunity to ask questions and all were answered. The patient agreed with the plan and demonstrated an understanding of the instructions.   The patient was advised to call back or seek an in-person evaluation if the symptoms worsen or if the condition fails to improve as anticipated.  I provided 60 minutes of non-face-to-face time during this encounter.   Cortlandt Capuano S, LCAS

## 2019-09-24 ENCOUNTER — Other Ambulatory Visit: Payer: Self-pay

## 2019-09-24 ENCOUNTER — Encounter (HOSPITAL_COMMUNITY): Payer: Self-pay | Admitting: Licensed Clinical Social Worker

## 2019-09-24 ENCOUNTER — Ambulatory Visit (INDEPENDENT_AMBULATORY_CARE_PROVIDER_SITE_OTHER): Payer: No Typology Code available for payment source | Admitting: Licensed Clinical Social Worker

## 2019-09-24 DIAGNOSIS — F411 Generalized anxiety disorder: Secondary | ICD-10-CM | POA: Diagnosis not present

## 2019-09-24 NOTE — Progress Notes (Signed)
Virtual Visit via Video Note  I connected with Cindy Jones on 09/10/19 at 1:00pm EDT by a video enabled telemedicine application and verified that I am speaking with the correct person using two identifiers.   I discussed the limitations of evaluation and management by telemedicine and the availability of in person appointments. The patient expressed understanding and agreed to proceed.  LOCATION: Patient:Home Provider: Home  History of Present Illness: Pt was referred to therapy by her psychiatrist at [facility] for anxiety and panic attacks.   Participation Level: Active   Type of Therapy: Virtual individual therapy  Treatment Goals addressed: Improve psychiatric symptoms, Controlled Behavior, Moderated Mood, Improve Unhelpful Thought Patterns, Emotional Regulation Skills (Moderate moods, anger management, stress management), Feel and express a full Range of Emotions, Learn about Diagnosis, Healthy Coping Skills.  Interventions: CBT/Supportive/Psychoeducation  Summary: Cindy Jones is a 29 y.o. female who presents anxious for her virtual individual counseling session. Patient discussed her psychiatric symptoms and current life events. Cln provided psychoeducation on effective coping skills for current stressors. Patient reports she went with her best friend for a baby sonogram and the dr.realized the baby was not viable. Patient described the situation and how she supported her friend and how she dealt with her own feelings.. Used socratic questions. Validated her feelings. Patient had her mother over for a visit and described how well it went. Used socratic questions. Patient has some anxiety with upcoming change of a new job. Clinician utilized MI OARS to reflect and summarize thoughts and feelings about her new job and change that she is experiencing. Clinician discussed the importance of focusing on the here and now. Patient was receptive.  Assessment and plan: Counselor will continue to  meet with patient to address treatment plan goals. Patient will continue to follow recommendations of providers and implement skills learned in session.  Suicidal/Homicidal: Nowithout intent/plan  Therapist Response: Assessed pt's current functioning and reviewed progress.. Assisted pt processing stressors, negative thoughts.  Assisted pt processing for the management of her stressors.  Participation Level: Active  Diagnosis: Axis I.:  GAD    Follow Up Instructions: I discussed the assessment and treatment plan with the patient. The patient was provided an opportunity to ask questions and all were answered. The patient agreed with the plan and demonstrated an understanding of the instructions.   The patient was advised to call back or seek an in-person evaluation if the symptoms worsen or if the condition fails to improve as anticipated.  I provided 60 minutes of non-face-to-face time during this encounter.   [name],[name], LCAS

## 2019-10-02 ENCOUNTER — Encounter (HOSPITAL_COMMUNITY): Payer: Self-pay | Admitting: Psychiatry

## 2019-10-02 ENCOUNTER — Other Ambulatory Visit: Payer: Self-pay

## 2019-10-02 ENCOUNTER — Telehealth (INDEPENDENT_AMBULATORY_CARE_PROVIDER_SITE_OTHER): Payer: No Typology Code available for payment source | Admitting: Psychiatry

## 2019-10-02 DIAGNOSIS — F502 Bulimia nervosa: Secondary | ICD-10-CM

## 2019-10-02 DIAGNOSIS — F988 Other specified behavioral and emotional disorders with onset usually occurring in childhood and adolescence: Secondary | ICD-10-CM | POA: Diagnosis not present

## 2019-10-02 DIAGNOSIS — F410 Panic disorder [episodic paroxysmal anxiety] without agoraphobia: Secondary | ICD-10-CM

## 2019-10-02 DIAGNOSIS — F952 Tourette's disorder: Secondary | ICD-10-CM

## 2019-10-02 MED ORDER — AMPHETAMINE-DEXTROAMPHET ER 25 MG PO CP24: 25.0000 mg | ORAL_CAPSULE | Freq: Every day | ORAL | 0 refills | Status: DC

## 2019-10-02 MED ORDER — AMPHETAMINE-DEXTROAMPHETAMINE 20 MG PO TABS: 20.0000 mg | ORAL_TABLET | Freq: Every day | ORAL | 0 refills | Status: DC | PRN

## 2019-10-02 MED ORDER — AMPHETAMINE-DEXTROAMPHETAMINE 20 MG PO TABS: 20.0000 mg | ORAL_TABLET | Freq: Every day | ORAL | 0 refills | Status: DC

## 2019-10-02 MED ORDER — AMPHETAMINE-DEXTROAMPHET ER 25 MG PO CP24: 25.0000 mg | ORAL_CAPSULE | ORAL | 0 refills | Status: DC

## 2019-10-02 NOTE — Progress Notes (Signed)
Virtual Visit via Telephone Note  I connected with Cindy Jones on 10/02/19 at  9:30 AM EDT by telephone and verified that I am speaking with the correct person using two identifiers.  Location: Patient: home Provider: office   I discussed the limitations, risks, security and privacy concerns of performing an evaluation and management service by telephone and the availability of in person appointments. I also discussed with the patient that there may be a patient responsible charge related to this service. The patient expressed understanding and agreed to proceed.   History of Present Illness: Cindy Jones reports she is doing well. She is starting a new job in 2 weeks. Cindy Jones is currently wrapping up things at her current work. She admits it is has been hard to work from home and it does affect her focus even with Adderall. With the job change she has decide to make a new home office and a desk. She thinks it will help. Cindy Jones has summer classes starting in 3 weeks. Her eye blinking is overall well controlled. It happens when stressed or working long days. She is using mindfulness to help when it happens. Cindy Jones states her panic attacks are not that often or bad. She has been diagnosed with asthma and now realizes she has probably had a asthma attack rather than a panic attack several times. She has a new running group. She is eating well and eating a little more stuff that she likes. Cindy Jones denies depression. She denies SI/HI.    Observations/Objective:  General Appearance: unable to assess  Eye Contact:  unable to assess  Speech:  Clear and Coherent and Normal Rate  Volume:  Normal  Mood:  Euthymic  Affect:  Full Range  Thought Process:  Goal Directed, Linear and Descriptions of Associations: Intact  Orientation:  Full (Time, Place, and Person)  Thought Content:  Logical  Suicidal Thoughts:  No  Homicidal Thoughts:  No  Memory:  Immediate;   Good  Judgement:  Good  Insight:  Good  Psychomotor  Activity: unable to assess  Concentration:  Concentration: Good  Recall:  Good  Fund of Knowledge:  Good  Language:  Good  Akathisia:  unable to assess  Handed:  Right  AIMS (if indicated):     Assets:  Communication Skills Desire for Improvement Financial Resources/Insurance Housing Intimacy Leisure Time Physical Health Resilience Social Support Talents/Skills Transportation Vocational/Educational  ADL's:  unable to assess  Cognition:  WNL  Sleep:    good     I reviewed the information below on 10/02/2019 and have updated it Assessment and Plan:  ADHD-inattentive type; Tourette's d/o; Panic attacks; Bulimia Nervosa; MDD- in remission   Status of current symptoms: doing well   Adderall XR 25mg  po qAM for ADHD.   Adderall 20mg  po qD prn ADHD for late afternoon for work days   Encouraged to continue therapy  She is thinking about trying to get pregnant in about 6 months. We discussed potential teratogenic effects of Adderall. Alyssia voiced understanding.   Follow Up Instructions: In 3 months or sooner if needed    I discussed the assessment and treatment plan with the patient. The patient was provided an opportunity to ask questions and all were answered. The patient agreed with the plan and demonstrated an understanding of the instructions.   The patient was advised to call back or seek an in-person evaluation if the symptoms worsen or if the condition fails to improve as anticipated.  I provided 20 minutes of  non-face-to-face time during this encounter.   Charlcie Cradle, MD

## 2019-10-06 ENCOUNTER — Other Ambulatory Visit: Payer: Self-pay

## 2019-10-06 ENCOUNTER — Ambulatory Visit (INDEPENDENT_AMBULATORY_CARE_PROVIDER_SITE_OTHER): Payer: No Typology Code available for payment source | Admitting: Licensed Clinical Social Worker

## 2019-10-06 ENCOUNTER — Encounter (HOSPITAL_COMMUNITY): Payer: Self-pay | Admitting: Licensed Clinical Social Worker

## 2019-10-06 DIAGNOSIS — F411 Generalized anxiety disorder: Secondary | ICD-10-CM | POA: Diagnosis not present

## 2019-10-06 NOTE — Progress Notes (Signed)
Virtual Visit via Video Note  I connected with Cindy Jones on 10/06/19 at 11:00am EDT by a video enabled telemedicine application and verified that I am speaking with the correct person using two identifiers.   I discussed the limitations of evaluation and management by telemedicine and the availability of in person appointments. The patient expressed understanding and agreed to proceed.  LOCATION: Patient:Home Provider: Home  History of Present Illness: Pt was referred to therapy by her psychiatrist at [facility] for anxiety and panic attacks.   Participation Level: Active   Type of Therapy: Virtual individual therapy  Treatment Goals addressed: Improve psychiatric symptoms, Controlled Behavior, Moderated Mood, Improve Unhelpful Thought Patterns, Emotional Regulation Skills (Moderate moods, anger management, stress management), Feel and express a full Range of Emotions, Learn about Diagnosis, Healthy Coping Skills.  Interventions: CBT/Supportive/Psychoeducation  Summary: Cindy Jones is a 29 y.o. female who presents anxious for her virtual individual counseling session. Patient discussed her psychiatric symptoms and current life events. Cln provided psychoeducation on effective coping skills for current stressors. Patient reports today is the last day of her job and she starts her new job next Monday. She also starts back to graduate school in 2 weeks. Clinician utilized MI OARS to reflect and summarize thoughts and feelings about this new normal life she is moving into. Clinician discussed the importance of focusing on the here and now, what she can control. Patient discussed communication errors with she and her husband currently. "He says I'm bossy." Role played communication errors.   Assessment and plan: Counselor will continue to meet with patient to address treatment plan goals. Patient will continue to follow recommendations of providers and implement skills learned in  session.  Suicidal/Homicidal: Nowithout intent/plan  Therapist Response: Assessed pt's current functioning and reviewed progress.. Assisted pt processing stressors, negative thoughts.  Assisted pt processing for the management of her stressors.  Participation Level: Active  Diagnosis: Axis I.:  GAD    Follow Up Instructions: I discussed the assessment and treatment plan with the patient. The patient was provided an opportunity to ask questions and all were answered. The patient agreed with the plan and demonstrated an understanding of the instructions.   The patient was advised to call back or seek an in-person evaluation if the symptoms worsen or if the condition fails to improve as anticipated.  I provided 60 minutes of non-face-to-face time during this encounter.   Shellee Streng S, LCAS

## 2019-10-08 ENCOUNTER — Ambulatory Visit (HOSPITAL_COMMUNITY): Payer: No Typology Code available for payment source | Admitting: Licensed Clinical Social Worker

## 2019-10-11 ENCOUNTER — Other Ambulatory Visit: Payer: Self-pay | Admitting: Family Medicine

## 2019-10-13 NOTE — Telephone Encounter (Signed)
Albuterol Rx has not be previously filled by Dr Claiborne Billings.   LMOM for pt stating that I would refuse this rx and she is to call back if she does need it to be filled by Dr Claiborne Billings.  Also left on the message that patient would most likely need an appointment to refill a medication that was never filled by Dr Claiborne Billings.  Okay to leave detailed message per DPR.

## 2019-10-15 ENCOUNTER — Ambulatory Visit (INDEPENDENT_AMBULATORY_CARE_PROVIDER_SITE_OTHER): Payer: No Typology Code available for payment source | Admitting: Licensed Clinical Social Worker

## 2019-10-15 ENCOUNTER — Other Ambulatory Visit: Payer: Self-pay

## 2019-10-15 ENCOUNTER — Encounter (HOSPITAL_COMMUNITY): Payer: Self-pay | Admitting: Licensed Clinical Social Worker

## 2019-10-15 DIAGNOSIS — F411 Generalized anxiety disorder: Secondary | ICD-10-CM

## 2019-10-15 NOTE — Progress Notes (Signed)
Virtual Visit via Video Note  I connected with Cindy Jones on 10/06/19 at 11:00am EDT by a video enabled telemedicine application and verified that I am speaking with the correct person using two identifiers.   I discussed the limitations of evaluation and management by telemedicine and the availability of in person appointments. The patient expressed understanding and agreed to proceed.  LOCATION: Patient:Home Provider: Home  History of Present Illness: Pt was referred to therapy by her psychiatrist at [facility] for anxiety and panic attacks.   Participation Level: Active   Type of Therapy: Virtual individual therapy  Treatment Goals addressed: Improve psychiatric symptoms, Controlled Behavior, Moderated Mood, Improve Unhelpful Thought Patterns, Emotional Regulation Skills (Moderate moods, anger management, stress management), Feel and express a full Range of Emotions, Learn about Diagnosis, Healthy Coping Skills.  Interventions: CBT/Supportive/Psychoeducation  Summary: Cindy Jones is a 29 y.o. female who presents anxious for her virtual individual counseling session. Patient discussed her psychiatric symptoms and current life events. Cln provided psychoeducation on effective coping skills for current stressors. Reviewed tx plan with pt who verbalized acceptance of the plan. Pt discussed her feelings about leaving her old job and starting her new job Monday. Clinician utilized MI OARS to reflect and summarize thoughts and feelings. Patient discussed her relationship with her mother: "I went and saw her and my grandparents last weekend." Used socratic questions. Patient reports her mother is working regular hours and it may be hard to schedule appointments including her. Patient reports she and her husband have been communicating better, using the skills learned and role-played in session.  PLAN: tourettes strategies    Assessment and plan: Counselor will continue to meet with patient to  address treatment plan goals. Patient will continue to follow recommendations of providers and implement skills learned in session.  Suicidal/Homicidal: Nowithout intent/plan  Therapist Response: Assessed pt's current functioning and reviewed progress.. Assisted pt processing stressors, negative thoughts.  Assisted pt processing for the management of her stressors.  Participation Level: Active  Diagnosis: Axis I.:  GAD    Follow Up Instructions: I discussed the assessment and treatment plan with the patient. The patient was provided an opportunity to ask questions and all were answered. The patient agreed with the plan and demonstrated an understanding of the instructions.   The patient was advised to call back or seek an in-person evaluation if the symptoms worsen or if the condition fails to improve as anticipated.  I provided 60 minutes of non-face-to-face time during this encounter.   Lindsie Simar S, LCAS

## 2019-10-21 ENCOUNTER — Encounter (HOSPITAL_COMMUNITY): Payer: Self-pay | Admitting: Licensed Clinical Social Worker

## 2019-10-21 ENCOUNTER — Other Ambulatory Visit: Payer: Self-pay

## 2019-10-21 ENCOUNTER — Ambulatory Visit (INDEPENDENT_AMBULATORY_CARE_PROVIDER_SITE_OTHER): Payer: No Typology Code available for payment source | Admitting: Licensed Clinical Social Worker

## 2019-10-21 DIAGNOSIS — F411 Generalized anxiety disorder: Secondary | ICD-10-CM

## 2019-10-21 NOTE — Progress Notes (Signed)
Virtual Visit via Video Note  I connected with Cindy Jones on 10/21/19 at 11:00am EDT by a video enabled telemedicine application and verified that I am speaking with the correct person using two identifiers.   I discussed the limitations of evaluation and management by telemedicine and the availability of in person appointments. The patient expressed understanding and agreed to proceed.  LOCATION: Patient:Home Provider: Home  History of Present Illness: Pt was referred to therapy by her psychiatrist at [facility] for anxiety and panic attacks.   Participation Level: Active   Type of Therapy: Virtual individual therapy  Treatment Goals addressed: Improve psychiatric symptoms, Controlled Behavior, Moderated Mood, Improve Unhelpful Thought Patterns, Emotional Regulation Skills (Moderate moods, anger management, stress management), Feel and express a full Range of Emotions, Learn about Diagnosis, Healthy Coping Skills.  Interventions: CBT/Supportive/Psychoeducation  Summary: Cindy Jones is a 29 y.o. female who presents anxious for her virtual individual counseling session. Patient discussed her psychiatric symptoms and current life events. Cln provided psychoeducation on effective coping skills for current stressors. Patient reported her frustrations adjusting to her new job. Clinician explored updates, as well as concerns at work. Patient discussed her tourette's symptoms which affects her eyes, her chest muscles, headaches. Cln provided psychoeducation on the cycle of her tourette's which exacerbates her stress which exacerbates her anxiety. Cln provided strategies and coping skills.      PLAN: tourettes strategies    Assessment and plan: Counselor will continue to meet with patient to address treatment plan goals. Patient will continue to follow recommendations of providers and implement skills learned in session.  Suicidal/Homicidal: Nowithout intent/plan  Therapist Response: Assessed  pt's current functioning and reviewed progress.. Assisted pt processing stressors, negative thoughts.  Assisted pt processing for the management of her stressors.  Participation Level: Active  Diagnosis: Axis I.:  GAD    Follow Up Instructions: I discussed the assessment and treatment plan with the patient. The patient was provided an opportunity to ask questions and all were answered. The patient agreed with the plan and demonstrated an understanding of the instructions.   The patient was advised to call back or seek an in-person evaluation if the symptoms worsen or if the condition fails to improve as anticipated.  I provided 60 minutes of non-face-to-face time during this encounter.   Ravin Denardo S, LCAS

## 2019-10-22 ENCOUNTER — Ambulatory Visit (HOSPITAL_COMMUNITY): Payer: No Typology Code available for payment source | Admitting: Licensed Clinical Social Worker

## 2019-10-27 ENCOUNTER — Other Ambulatory Visit: Payer: Self-pay

## 2019-10-27 ENCOUNTER — Encounter: Payer: Self-pay | Admitting: Family Medicine

## 2019-10-27 ENCOUNTER — Ambulatory Visit (INDEPENDENT_AMBULATORY_CARE_PROVIDER_SITE_OTHER): Payer: No Typology Code available for payment source | Admitting: Family Medicine

## 2019-10-27 VITALS — BP 126/87 | HR 75 | Temp 98.6°F | Resp 18 | Ht 65.75 in | Wt 186.1 lb

## 2019-10-27 DIAGNOSIS — J45990 Exercise induced bronchospasm: Secondary | ICD-10-CM | POA: Diagnosis not present

## 2019-10-27 DIAGNOSIS — E663 Overweight: Secondary | ICD-10-CM

## 2019-10-27 DIAGNOSIS — Z130 Encounter for screening for diseases of the blood and blood-forming organs and certain disorders involving the immune mechanism: Secondary | ICD-10-CM

## 2019-10-27 DIAGNOSIS — Z131 Encounter for screening for diabetes mellitus: Secondary | ICD-10-CM

## 2019-10-27 DIAGNOSIS — Z0001 Encounter for general adult medical examination with abnormal findings: Secondary | ICD-10-CM

## 2019-10-27 DIAGNOSIS — Z0000 Encounter for general adult medical examination without abnormal findings: Secondary | ICD-10-CM | POA: Insufficient documentation

## 2019-10-27 DIAGNOSIS — Z79899 Other long term (current) drug therapy: Secondary | ICD-10-CM | POA: Diagnosis not present

## 2019-10-27 LAB — POCT GLYCOSYLATED HEMOGLOBIN (HGB A1C)
HbA1c POC (<> result, manual entry): 5 % (ref 4.0–5.6)
HbA1c, POC (controlled diabetic range): 5 % (ref 0.0–7.0)
HbA1c, POC (prediabetic range): 5 % — AB (ref 5.7–6.4)
Hemoglobin A1C: 5 % (ref 4.0–5.6)

## 2019-10-27 MED ORDER — ALBUTEROL SULFATE HFA 108 (90 BASE) MCG/ACT IN AERS: INHALATION_SPRAY | RESPIRATORY_TRACT | 5 refills | Status: DC

## 2019-10-27 MED FILL — ALBUTEROL SULFATE HFA 108 (: 108 (90 BAS | 25 days supply | Qty: 18 | Fill #0

## 2019-10-27 NOTE — Patient Instructions (Signed)
Health Maintenance, Female Adopting a healthy lifestyle and getting preventive care are important in promoting health and wellness. Ask your health care provider about:  The right schedule for you to have regular tests and exams.  Things you can do on your own to prevent diseases and keep yourself healthy. What should I know about diet, weight, and exercise? Eat a healthy diet   Eat a diet that includes plenty of vegetables, fruits, low-fat dairy products, and lean protein.  Do not eat a lot of foods that are high in solid fats, added sugars, or sodium. Maintain a healthy weight Body mass index (BMI) is used to identify weight problems. It estimates body fat based on height and weight. Your health care provider can help determine your BMI and help you achieve or maintain a healthy weight. Get regular exercise Get regular exercise. This is one of the most important things you can do for your health. Most adults should:  Exercise for at least 150 minutes each week. The exercise should increase your heart rate and make you sweat (moderate-intensity exercise).  Do strengthening exercises at least twice a week. This is in addition to the moderate-intensity exercise.  Spend less time sitting. Even light physical activity can be beneficial. Watch cholesterol and blood lipids Have your blood tested for lipids and cholesterol at 29 years of age, then have this test every 5 years. Have your cholesterol levels checked more often if:  Your lipid or cholesterol levels are high.  You are older than 29 years of age.  You are at high risk for heart disease. What should I know about cancer screening? Depending on your health history and family history, you may need to have cancer screening at various ages. This may include screening for:  Breast cancer.  Cervical cancer.  Colorectal cancer.  Skin cancer.  Lung cancer. What should I know about heart disease, diabetes, and high blood  pressure? Blood pressure and heart disease  High blood pressure causes heart disease and increases the risk of stroke. This is more likely to develop in people who have high blood pressure readings, are of African descent, or are overweight.  Have your blood pressure checked: ? Every 3-5 years if you are 18-39 years of age. ? Every year if you are 40 years old or older. Diabetes Have regular diabetes screenings. This checks your fasting blood sugar level. Have the screening done:  Once every three years after age 40 if you are at a normal weight and have a low risk for diabetes.  More often and at a younger age if you are overweight or have a high risk for diabetes. What should I know about preventing infection? Hepatitis B If you have a higher risk for hepatitis B, you should be screened for this virus. Talk with your health care provider to find out if you are at risk for hepatitis B infection. Hepatitis C Testing is recommended for:  Everyone born from 1945 through 1965.  Anyone with known risk factors for hepatitis C. Sexually transmitted infections (STIs)  Get screened for STIs, including gonorrhea and chlamydia, if: ? You are sexually active and are younger than 29 years of age. ? You are older than 29 years of age and your health care provider tells you that you are at risk for this type of infection. ? Your sexual activity has changed since you were last screened, and you are at increased risk for chlamydia or gonorrhea. Ask your health care provider if   you are at risk.  Ask your health care provider about whether you are at high risk for HIV. Your health care provider may recommend a prescription medicine to help prevent HIV infection. If you choose to take medicine to prevent HIV, you should first get tested for HIV. You should then be tested every 3 months for as long as you are taking the medicine. Pregnancy  If you are about to stop having your period (premenopausal) and  you may become pregnant, seek counseling before you get pregnant.  Take 400 to 800 micrograms (mcg) of folic acid every day if you become pregnant.  Ask for birth control (contraception) if you want to prevent pregnancy. Osteoporosis and menopause Osteoporosis is a disease in which the bones lose minerals and strength with aging. This can result in bone fractures. If you are 65 years old or older, or if you are at risk for osteoporosis and fractures, ask your health care provider if you should:  Be screened for bone loss.  Take a calcium or vitamin D supplement to lower your risk of fractures.  Be given hormone replacement therapy (HRT) to treat symptoms of menopause. Follow these instructions at home: Lifestyle  Do not use any products that contain nicotine or tobacco, such as cigarettes, e-cigarettes, and chewing tobacco. If you need help quitting, ask your health care provider.  Do not use street drugs.  Do not share needles.  Ask your health care provider for help if you need support or information about quitting drugs. Alcohol use  Do not drink alcohol if: ? Your health care provider tells you not to drink. ? You are pregnant, may be pregnant, or are planning to become pregnant.  If you drink alcohol: ? Limit how much you use to 0-1 drink a day. ? Limit intake if you are breastfeeding.  Be aware of how much alcohol is in your drink. In the [place] one drink equals one 12 oz bottle of beer (355 mL), one 5 oz glass of wine (148 mL), or one 1 oz glass of hard liquor (44 mL). General instructions  Schedule regular health, dental, and eye exams.  Stay current with your vaccines.  Tell your health care provider if: ? You often feel depressed. ? You have ever been abused or do not feel safe at home. Summary  Adopting a healthy lifestyle and getting preventive care are important in promoting health and wellness.  Follow your health care provider's instructions about healthy  diet, exercising, and getting tested or screened for diseases.  Follow your health care provider's instructions on monitoring your cholesterol and blood pressure. This information is not intended to replace advice given to you by your health care provider. Make sure you discuss any questions you have with your health care provider. Document Revised: 04/10/2018 Document Reviewed: 04/10/2018 Elsevier Patient Education  2020 Elsevier Inc.  

## 2019-10-27 NOTE — Addendum Note (Signed)
Addended by: Eulah Pont on: 10/27/2019 01:57 PM   Modules accepted: Orders

## 2019-10-27 NOTE — Progress Notes (Signed)
This visit occurred during the SARS-CoV-2 public health emergency.  Safety protocols were in place, including screening questions prior to the visit, additional usage of staff PPE, and extensive cleaning of exam room while observing appropriate contact time as indicated for disinfecting solutions.    Patient ID: Cindy Jones, female  DOB: 29-Jun-1990, 29 y.o.   MRN: [id number] Patient Care Team    Relationship Specialty Notifications Start End  Natalia Leatherwood, DO PCP - General Family Medicine  06/12/19   Karie Soda, MD Consulting Physician General Surgery  01/31/16   Armbruster, Willaim Rayas, MD Consulting Physician Gastroenterology  01/31/16   Reva Bores, MD Consulting Physician Obstetrics and Gynecology  10/27/19     Chief Complaint  Patient presents with  . Annual Exam    Fasting. Pap 06/2019, with GYN.     Subjective:  Cindy Jones is a 29 y.o.  Female  present for CPE. All past medical history, surgical history, allergies, family history, immunizations, medications and social history were updated in the electronic medical record today. All recent labs, ED visits and hospitalizations within the last year were reviewed. Asthma:  Worsening since she started running outside. She is taking a daily antihistamine and using albuterol prior to running which has helped.   Health maintenance:  Colonoscopy:routine screen at 78,  EGD completed 12/2015 Dr. Adela Lank Mammogram: No Fhx screen at 40 Cervical cancer screening: last pap:06/09/2019, Dr. Shawnie Pons Immunizations: tdap UTD 2017, Influenza  (encouraged yearly), COVID series completed.  Infectious disease screening: HIV completed 2015 DEXA: N/A Assistive device: none Oxygen WLS:LHTD Patient has a Dental home. Hospitalizations/ED visits: reviewed  Depression screen [facility] 2/9 10/27/2019 06/03/2018 11/05/2017 02/14/2017 07/14/2016  Decreased Interest 0 0 0 0 0  Down, Depressed, Hopeless 0 0 0 1 0  PHQ - 2 Score 0 0 0 1 0  Altered sleeping  - 1 0 0 -  Tired, decreased energy - 1 1 2  -  Change in appetite - 0 0 0 -  Feeling bad or failure about yourself  - 1 1 0 -  Trouble concentrating - 1 0 1 -  Moving slowly or fidgety/restless - 0 0 0 -  Suicidal thoughts - 0 0 0 -  PHQ-9 Score - 4 2 4  -  Difficult doing work/chores - Not difficult at all - - -  Some recent data might be hidden   GAD 7 : Generalized Anxiety Score 06/03/2018 11/05/2017 02/14/2017  Nervous, Anxious, on Edge 1 1 2   Control/stop worrying 1 0 1  Worry too much - different things 1 1 1   Trouble relaxing 1 1 1   Restless 0 0 0  Easily annoyed or irritable 1 1 1   Afraid - awful might happen 0 0 0  Total GAD 7 Score 5 4 6   Anxiety Difficulty Not difficult at all - -    Immunization History  Administered Date(s) Administered  . Influenza Inj Mdck Quad With Preservative 03/31/2017, 02/08/2018  . Influenza,inj,Quad PF,6+ Mos 06/08/2014  . Influenza-Unspecified 02/24/2015, 02/05/2018  . PFIZER SARS-COV-2 Vaccination 07/21/2019, 08/11/2019  . Td 07/14/2015  . Tdap 05/01/2005    Past Medical History:  Diagnosis Date  . Anxiety   . Asthma    as a child  . Binge eating disorder   . Dysrhythmia    " I feel it about once a month"  . Frequent headaches   . GAD (generalized anxiety disorder)   . GERD (gastroesophageal reflux disease)   . Major depression  in complete remission (HCC)   . Migraines   . PID (acute pelvic inflammatory disease)   . Seasonal allergies   . Tourette disorder   . Urinary tract bacterial infections    Allergies  Allergen Reactions  . Latex Swelling and Rash  . Cranberry Juice Powder Other (See Comments)    Throat pain.  . Naproxen Other (See Comments)    "Liver starts hurting" Diarrhea  . Nickel     Skin peeling  . Sulfa Antibiotics Hives and Swelling   Past Surgical History:  Procedure Laterality Date  . CHOLECYSTECTOMY    . ESOPHAGOGASTRODUODENOSCOPY    . LAPAROSCOPIC CHOLECYSTECTOMY SINGLE SITE WITH INTRAOPERATIVE  CHOLANGIOGRAM N/A 03/15/2016   Procedure: LAPAROSCOPIC CHOLECYSTECTOMY SINGLE SITE WITH INTRAOPERATIVE CHOLANGIOGRAM;  Surgeon: Karie Soda, MD;  Location: [facility] OR;  Service: General;  Laterality: N/A;  . TONSILLECTOMY AND ADENOIDECTOMY  2013  . WISDOM TOOTH EXTRACTION     Family History  Problem Relation Age of Onset  . Alcohol abuse Mother   . Bipolar disorder Mother   . Cancer Father        sarcoma; passed away when pt was 8  . Tourette syndrome Maternal Aunt   . Alcohol abuse Maternal Aunt   . Hyperlipidemia Paternal Aunt   . Tourette syndrome Maternal Grandfather   . Heart disease Paternal Grandfather   . Alcohol abuse Maternal Uncle   . Suicidality Neg Hx    Social History   Social History Narrative   - Married, no children.   - Naval architect.   - She works FT as a Patent attorney.   - Her aunt & uncle were her guardians when she was in HS. They live in [place], [place].    - She has a fraternal twin sister.    - Wears her seatbelt, smoke detectors in the home.    Allergies as of 10/27/2019      Reactions   Latex Swelling, Rash   Cranberry Juice Powder Other (See Comments)   Throat pain.   Naproxen Other (See Comments)   "Liver starts hurting" Diarrhea   Nickel    Skin peeling   Sulfa Antibiotics Hives, Swelling      Medication List       Accurate as of October 27, 2019  1:29 PM. If you have any questions, ask your nurse or doctor.        STOP taking these medications   triamcinolone cream 0.1 % Commonly known as: KENALOG Stopped by: Felix Pacini, DO     TAKE these medications   albuterol 108 (90 Base) MCG/ACT inhaler Commonly known as: VENTOLIN HFA 1-2 puffs 15 minutes prior to exercise. What changed:   how much to take  how to take this  when to take this  reasons to take this  additional instructions Changed by: Felix Pacini, DO   amphetamine-dextroamphetamine 25 MG 24 hr capsule Commonly known as: ADDERALL XR Take 1 capsule by mouth  daily. What changed: Another medication with the same name was removed. Continue taking this medication, and follow the directions you see here. Changed by: Felix Pacini, DO   amphetamine-dextroamphetamine 20 MG tablet Commonly known as: Adderall Take 1 tablet (20 mg total) by mouth daily as needed. What changed: Another medication with the same name was removed. Continue taking this medication, and follow the directions you see here. Changed by: Felix Pacini, DO   aspirin-acetaminophen-caffeine 250-250-65 MG tablet Commonly known as: EXCEDRIN MIGRAINE Take 1-2 tablets by mouth every  6 (six) hours as needed for headache.   MELATONIN ER PO Take by mouth.       All past medical history, surgical history, allergies, family history, immunizations andmedications were updated in the EMR today and reviewed under the history and medication portions of their EMR.     No results found for this or any previous visit (from the past 2160 hour(s)).  ROS: 14 pt review of systems performed and negative (unless mentioned in an HPI)  Objective: BP 126/87 (BP Location: Left Arm, Patient Position: Sitting, Cuff Size: Normal)   Pulse 75   Temp 98.6 F (37 C) (Temporal)   Resp 18   Ht 5' 5.75" (1.67 m)   Wt 186 lb 2 oz (84.4 kg)   LMP 10/26/2019 (Exact Date)   SpO2 100%   BMI 30.27 kg/m  Gen: Afebrile. No acute distress. Nontoxic in appearance, well-developed, well-nourished,  Pleasant female.  HENT: AT. [place]. Bilateral TM visualized and normal in appearance, normal external auditory canal. MMM, no oral lesions, adequate dentition. Bilateral nares within normal limits. Throat without erythema, ulcerations or exudates. no Cough on exam, no hoarseness on exam. Eyes:Pupils Equal Round Reactive to light, Extraocular movements intact,  Conjunctiva without redness, discharge or icterus. Neck/lymp/endocrine: Supple,no lymphadenopathy, no thyromegaly CV: RRR no murmur, no edema, +2/4 P posterior tibialis  pulses.  Chest: CTAB, no wheeze, rhonchi or crackles. nomral Respiratory effort. good Air movement. Abd: Soft. flat. NTND. BS presentn. no Masses palpated. No hepatosplenomegaly. No rebound tenderness or guarding. Skin: no rashes, purpura or petechiae. Warm and well-perfused. Skin intact. Neuro/Msk:  Normal gait. PERLA. EOMi. Alert. Oriented x3.  Cranial nerves II through XII intact. Muscle strength 5/5 upper/lower extremity. DTRs equal bilaterally. Psych: Normal affect, dress and demeanor. Normal speech. Normal thought content and judgment.  No exam data present  Assessment/plan: Cindy Jones is a 29 y.o. female present for CPE Overweight (BMI 25.0-29.9) - Lipid panel Screening for diabetes mellitus - Hemoglobin A1c Encounter for long-term current use of medication - Comprehensive metabolic panel Screening for deficiency anemia - CBC Exercise-induced asthma She started running outside (2/2 COVID pandemic) and she has noticed her asthma returned. She saw an UC and they prescribed her albuterol, which she has been using pre-run and it has helped.  Continue albuterol 1-2 puffs prior to exercise.   Encounter for preventive care Patient was encouraged to exercise greater than 150 minutes a week. Patient was encouraged to choose a diet filled with fresh fruits and vegetables, and lean meats. AVS provided to patient today for education/recommendation on gender specific health and safety maintenance. Colonoscopy:routine screen at 69,  EGD completed 12/2015 Dr. Havery Moros Mammogram: No Fhx screen at 40 Cervical cancer screening: last pap:06/09/2019, Dr. Kennon Rounds Immunizations: tdap UTD 2017, Influenza  (encouraged yearly), COVID series completed.  Infectious disease screening: HIV completed 2015 DEXA: N/A   Return in about 1 year (around 10/26/2020) for CPE (30 min).   Orders Placed This Encounter  Procedures  . CBC  . Comprehensive metabolic panel  . Hemoglobin A1c  . Lipid panel    Meds ordered this encounter  Medications  . albuterol (VENTOLIN HFA) 108 (90 Base) MCG/ACT inhaler    Sig: 1-2 puffs 15 minutes prior to exercise.    Dispense:  18 g    Refill:  5   Referral Orders  No referral(s) requested today     Electronically signed by: Howard Pouch, [place]

## 2019-10-28 LAB — CBC
HCT: 37.8 % (ref 35.0–45.0)
Hemoglobin: 12.7 g/dL (ref 11.7–15.5)
MCH: 30.4 pg (ref 27.0–33.0)
MCHC: 33.6 g/dL (ref 32.0–36.0)
MCV: 90.4 fL (ref 80.0–100.0)
MPV: 11.8 fL (ref 7.5–12.5)
Platelets: 159 10*3/uL (ref 140–400)
RBC: 4.18 10*6/uL (ref 3.80–5.10)
RDW: 12.6 % (ref 11.0–15.0)
WBC: 4.1 10*3/uL (ref 3.8–10.8)

## 2019-10-28 LAB — COMPREHENSIVE METABOLIC PANEL
AG Ratio: 2.2 (calc) (ref 1.0–2.5)
ALT: 17 U/L (ref 6–29)
AST: 19 U/L (ref 10–30)
Albumin: 4.8 g/dL (ref 3.6–5.1)
Alkaline phosphatase (APISO): 38 U/L (ref 31–125)
BUN: 11 mg/dL (ref 7–25)
CO2: 28 mmol/L (ref 20–32)
Calcium: 9.6 mg/dL (ref 8.6–10.2)
Chloride: 104 mmol/L (ref 98–110)
Creat: 0.76 mg/dL (ref 0.50–1.10)
Globulin: 2.2 g/dL (calc) (ref 1.9–3.7)
Glucose, Bld: 92 mg/dL (ref 65–99)
Potassium: 4.2 mmol/L (ref 3.5–5.3)
Sodium: 138 mmol/L (ref 135–146)
Total Bilirubin: 0.5 mg/dL (ref 0.2–1.2)
Total Protein: 7 g/dL (ref 6.1–8.1)

## 2019-10-28 LAB — LIPID PANEL
Cholesterol: 125 mg/dL (ref <200)
HDL: 64 mg/dL (ref >=50)
LDL Cholesterol (Calc): 48 mg/dL (calc)
Non-HDL Cholesterol (Calc): 61 mg/dL (calc) (ref <130)
Total CHOL/HDL Ratio: 2 (calc) (ref <5.0)
Triglycerides: 54 mg/dL (ref <150)

## 2019-10-29 ENCOUNTER — Encounter (HOSPITAL_COMMUNITY): Payer: Self-pay | Admitting: Licensed Clinical Social Worker

## 2019-10-29 ENCOUNTER — Ambulatory Visit (INDEPENDENT_AMBULATORY_CARE_PROVIDER_SITE_OTHER): Payer: No Typology Code available for payment source | Admitting: Licensed Clinical Social Worker

## 2019-10-29 ENCOUNTER — Other Ambulatory Visit: Payer: Self-pay

## 2019-10-29 DIAGNOSIS — F411 Generalized anxiety disorder: Secondary | ICD-10-CM

## 2019-10-29 NOTE — Progress Notes (Signed)
Virtual Visit via Video Note  I connected with Cindy Jones on 10/29/19 at 2:00pm EDT by a video enabled telemedicine application and verified that I am speaking with the correct person using two identifiers.   I discussed the limitations of evaluation and management by telemedicine and the availability of in person appointments. The patient expressed understanding and agreed to proceed.  LOCATION: Patient:Home Provider: Home  History of Present Illness: Pt was referred to therapy by her psychiatrist at [facility] for anxiety and panic attacks.   Participation Level: Active   Type of Therapy: Virtual individual therapy  Treatment Goals addressed: Improve psychiatric symptoms, Controlled Behavior, Moderated Mood, Improve Unhelpful Thought Patterns, Emotional Regulation Skills (Moderate moods, anger management, stress management), Feel and express a full Range of Emotions, Learn about Diagnosis, Healthy Coping Skills.  Interventions: CBT/Supportive/Psychoeducation  Summary: Cindy Jones is a 29 y.o. female who presents anxious for her virtual individual counseling session. Patient discussed her psychiatric symptoms and current life events. Cln provided psychoeducation on effective coping skills for current stressors. Patient reported  on her relationship with her mother, processing whether her mother wants to return to family therapy. Patient will reach out to her mother to see how she is progressing in her own therapy. Patient discussed her PCP yearly dr visit Monday. She made the mistake of looking at the AVS where she saw her weight. Patient tearfully described her feelings about her weight, how she didn't want to share the # with her husband. Patient started to self blame herself, feeling shame. Clinician utilized MI OARS to reflect and summarize thoughts and feelings.    PLAN: tourettes strategies    Assessment and plan: Counselor will continue to meet with patient to address treatment  plan goals. Patient will continue to follow recommendations of providers and implement skills learned in session.  Suicidal/Homicidal: Nowithout intent/plan  Therapist Response: Assessed pt's current functioning and reviewed progress.. Assisted pt processing stressors, negative thoughts.  Assisted pt processing for the management of her stressors.  Participation Level: Active  Diagnosis: Axis I.:  GAD    Follow Up Instructions: I discussed the assessment and treatment plan with the patient. The patient was provided an opportunity to ask questions and all were answered. The patient agreed with the plan and demonstrated an understanding of the instructions.   The patient was advised to call back or seek an in-person evaluation if the symptoms worsen or if the condition fails to improve as anticipated.  I provided 60 minutes of non-face-to-face time during this encounter.   Jachelle Fluty S, LCAS

## 2019-11-05 ENCOUNTER — Encounter (HOSPITAL_COMMUNITY): Payer: Self-pay | Admitting: Licensed Clinical Social Worker

## 2019-11-05 ENCOUNTER — Other Ambulatory Visit: Payer: Self-pay

## 2019-11-05 ENCOUNTER — Ambulatory Visit (INDEPENDENT_AMBULATORY_CARE_PROVIDER_SITE_OTHER): Payer: No Typology Code available for payment source | Admitting: Licensed Clinical Social Worker

## 2019-11-05 DIAGNOSIS — F411 Generalized anxiety disorder: Secondary | ICD-10-CM

## 2019-11-05 NOTE — Progress Notes (Signed)
Virtual Visit via Video Note  I connected with Cindy Jones on 11/05/19 at 1:00pm EDT by a video enabled telemedicine application and verified that I am speaking with the correct person using two identifiers.   I discussed the limitations of evaluation and management by telemedicine and the availability of in person appointments. The patient expressed understanding and agreed to proceed.  LOCATION: Patient:Home Provider: Home  History of Present Illness: Pt was referred to therapy by her psychiatrist at [facility] for anxiety and panic attacks.   Participation Level: Active   Type of Therapy: Virtual individual therapy  Treatment Goals addressed: Improve psychiatric symptoms, Controlled Behavior, Moderated Mood, Improve Unhelpful Thought Patterns, Emotional Regulation Skills (Moderate moods, anger management, stress management), Feel and express a full Range of Emotions, Learn about Diagnosis, Healthy Coping Skills.  Interventions: CBT/Supportive/Psychoeducation  Summary: Cindy Jones is a 29 y.o. female who presents anxious for her virtual individual counseling session. Patient discussed her psychiatric symptoms and current life events. Cln provided psychoeducation on effective coping skills for current stressors. Patient described the relationship with her best friend. Used socratic questions. Cln provided psychoeducation on communication and relationships. Patient and client explored anxiety symptoms, possible medications, tourettes symptoms.    PLAN: tourettes strategies    Assessment and plan: Counselor will continue to meet with patient to address treatment plan goals. Patient will continue to follow recommendations of providers and implement skills learned in session.  Suicidal/Homicidal: Nowithout intent/plan  Therapist Response: Assessed pt's current functioning and reviewed progress.. Assisted pt processing stressors, negative thoughts.  Assisted pt processing for the management  of her stressors.  Participation Level: Active  Diagnosis: Axis I.:  GAD    Follow Up Instructions: I discussed the assessment and treatment plan with the patient. The patient was provided an opportunity to ask questions and all were answered. The patient agreed with the plan and demonstrated an understanding of the instructions.   The patient was advised to call back or seek an in-person evaluation if the symptoms worsen or if the condition fails to improve as anticipated.  I provided 60 minutes of non-face-to-face time during this encounter.   Briseida Gittings S, LCAS

## 2019-11-11 ENCOUNTER — Ambulatory Visit (INDEPENDENT_AMBULATORY_CARE_PROVIDER_SITE_OTHER): Payer: No Typology Code available for payment source | Admitting: Licensed Clinical Social Worker

## 2019-11-11 ENCOUNTER — Other Ambulatory Visit: Payer: Self-pay

## 2019-11-11 ENCOUNTER — Encounter (HOSPITAL_COMMUNITY): Payer: Self-pay | Admitting: Licensed Clinical Social Worker

## 2019-11-11 DIAGNOSIS — F411 Generalized anxiety disorder: Secondary | ICD-10-CM | POA: Diagnosis not present

## 2019-11-11 DIAGNOSIS — F410 Panic disorder [episodic paroxysmal anxiety] without agoraphobia: Secondary | ICD-10-CM

## 2019-11-11 NOTE — Progress Notes (Signed)
Virtual Visit via Video Note  I connected with Cindy Jones on 11/11/19 at 1:00pm EDT by a video enabled telemedicine application and verified that I am speaking with the correct person using two identifiers.   I discussed the limitations of evaluation and management by telemedicine and the availability of in person appointments. The patient expressed understanding and agreed to proceed.  LOCATION: Patient:Home Provider: Home  History of Present Illness: Pt was referred to therapy by her psychiatrist at [facility] for anxiety and panic attacks.   Participation Level: Active   Type of Therapy: Virtual individual therapy  Treatment Goals addressed: Improve psychiatric symptoms, Controlled Behavior, Moderated Mood, Improve Unhelpful Thought Patterns, Emotional Regulation Skills (Moderate moods, anger management, stress management), Feel and express a full Range of Emotions, Learn about Diagnosis, Healthy Coping Skills.  Interventions: CBT/Supportive/Psychoeducation  Summary: Cindy Jones is a 29 y.o. female who presents anxious for her virtual individual counseling session. Patient discussed her psychiatric symptoms and current life events. Cln provided psychoeducation on effective coping skills for current stressors. Patient continued to discuss the relationship with her best friend tearfully. Used socratic questions. Role played communication errors. Patient described her panic attacks that have happened surrounding her social anxiety. Cln provided psychoeducation on mindfulness techniques.   PLAN: tourettes strategies?    Assessment and plan: Counselor will continue to meet with patient to address treatment plan goals. Patient will continue to follow recommendations of providers and implement skills learned in session.  Suicidal/Homicidal: Nowithout intent/plan  Therapist Response: Assessed pt's current functioning and reviewed progress.. Assisted pt processing stressors, negative  thoughts.  Assisted pt processing for the management of her stressors.  Participation Level: Active  Diagnosis: Axis I.:  GAD    Follow Up Instructions: I discussed the assessment and treatment plan with the patient. The patient was provided an opportunity to ask questions and all were answered. The patient agreed with the plan and demonstrated an understanding of the instructions.   The patient was advised to call back or seek an in-person evaluation if the symptoms worsen or if the condition fails to improve as anticipated.  I provided 60 minutes of non-face-to-face time during this encounter.   Winola Drum S, LCAS

## 2019-11-12 MED FILL — AMPHETAMINE-DEXTROAMPHETAMI: 20 | 30 days supply | Qty: 30 | Fill #0

## 2019-11-12 MED FILL — ADDERALL XR 25 MG CAPSULE: 25 | 30 days supply | Qty: 30 | Fill #0

## 2019-11-14 MED FILL — AMOX-CLAV 500-125 MG TABLET: 500-125 | 14 days supply | Qty: 28 | Fill #0

## 2019-11-18 ENCOUNTER — Encounter (HOSPITAL_COMMUNITY): Payer: Self-pay | Admitting: Licensed Clinical Social Worker

## 2019-11-18 ENCOUNTER — Other Ambulatory Visit: Payer: Self-pay

## 2019-11-18 ENCOUNTER — Ambulatory Visit (INDEPENDENT_AMBULATORY_CARE_PROVIDER_SITE_OTHER): Payer: No Typology Code available for payment source | Admitting: Licensed Clinical Social Worker

## 2019-11-18 DIAGNOSIS — F411 Generalized anxiety disorder: Secondary | ICD-10-CM

## 2019-11-18 NOTE — Progress Notes (Signed)
Virtual Visit via Video Note  I connected with Cindy Jones on 11/18/19 at 1:00pm EDT by a video enabled telemedicine application and verified that I am speaking with the correct person using two identifiers.   I discussed the limitations of evaluation and management by telemedicine and the availability of in person appointments. The patient expressed understanding and agreed to proceed.  LOCATION: Patient:Home Provider: Home  History of Present Illness: Pt was referred to therapy by her psychiatrist at [facility] for anxiety and panic attacks.   Participation Level: Active   Type of Therapy: Virtual individual therapy  Treatment Goals addressed: Improve psychiatric symptoms, Controlled Behavior, Moderated Mood, Improve Unhelpful Thought Patterns, Emotional Regulation Skills (Moderate moods, anger management, stress management), Feel and express a full Range of Emotions, Learn about Diagnosis, Healthy Coping Skills.  Interventions: CBT/Supportive/Psychoeducation  Summary: Cindy Jones is a 29 y.o. female who presents anxious for her virtual individual counseling session. Patient discussed her psychiatric symptoms and current life events. Cln provided psychoeducation on effective coping skills for current stressors. Patient discussed the conversation she had with her best friend using effective communication role played in session. Used socratic questions. Patient tearfully described her continued battle with her body, eating disorder. Again, reminded patient she can be referred to an ED therapist. Patient described her anxiety about "asking for help." Explored her anxiety and reasons for it. Explored ACOA traits: chaos and how it feels familiar and comfortable. Explored sleeping difficulties, "I have a hard time getting up in the morning." patient has been using different strategies.     PLAN: tourettes strategies?    Assessment and plan: Counselor will continue to meet with patient to  address treatment plan goals. Patient will continue to follow recommendations of providers and implement skills learned in session.  Suicidal/Homicidal: Nowithout intent/plan  Therapist Response: Assessed pt's current functioning and reviewed progress.. Assisted pt processing stressors, negative thoughts.  Assisted pt processing for the management of her stressors.  Participation Level: Active  Diagnosis: Axis I.:  GAD    Follow Up Instructions: I discussed the assessment and treatment plan with the patient. The patient was provided an opportunity to ask questions and all were answered. The patient agreed with the plan and demonstrated an understanding of the instructions.   The patient was advised to call back or seek an in-person evaluation if the symptoms worsen or if the condition fails to improve as anticipated.  I provided 60 minutes of non-face-to-face time during this encounter.   Fallou Hulbert S, LCAS

## 2019-11-25 ENCOUNTER — Encounter (HOSPITAL_COMMUNITY): Payer: Self-pay | Admitting: Licensed Clinical Social Worker

## 2019-11-25 ENCOUNTER — Other Ambulatory Visit: Payer: Self-pay

## 2019-11-25 ENCOUNTER — Ambulatory Visit (INDEPENDENT_AMBULATORY_CARE_PROVIDER_SITE_OTHER): Payer: No Typology Code available for payment source | Admitting: Licensed Clinical Social Worker

## 2019-11-25 DIAGNOSIS — F411 Generalized anxiety disorder: Secondary | ICD-10-CM

## 2019-11-25 NOTE — Progress Notes (Signed)
Virtual Visit via Video Note  I connected with Cindy Jones on 11/25/19 at 2:00pm EDT by a video enabled telemedicine application and verified that I am speaking with the correct person using two identifiers.   I discussed the limitations of evaluation and management by telemedicine and the availability of in person appointments. The patient expressed understanding and agreed to proceed.  LOCATION: Patient:Home Provider: Home  History of Present Illness: Pt was referred to therapy by her psychiatrist at [facility] for anxiety and panic attacks.   Participation Level: Active   Type of Therapy: Virtual individual therapy  Treatment Goals addressed: Improve psychiatric symptoms, Controlled Behavior, Moderated Mood, Improve Unhelpful Thought Patterns, Emotional Regulation Skills (Moderate moods, anger management, stress management), Feel and express a full Range of Emotions, Learn about Diagnosis, Healthy Coping Skills.  Interventions: CBT/Supportive/Psychoeducation  Summary: Cindy Jones is a 29 y.o. female who presents anxious for her virtual individual counseling session. Patient discussed her psychiatric symptoms and current life events. Cln provided psychoeducation on effective coping skills for current stressors. Patient described her current stressor: mother had surgery, appendicitis. "It was a difficult situation while my mom was in the hosptial." Her mother was using "addict behavior" while in the hospital, manipulating the system and patient. Patient was tearful in her description of the relationship with her mother, memories of her childhood. Suggested to patient she journal her feelings about her mother to be explored at next session.   PLAN: tourettes strategies, journal feelings    Assessment and plan: Counselor will continue to meet with patient to address treatment plan goals. Patient will continue to follow recommendations of providers and implement skills learned in  session.  Suicidal/Homicidal: Nowithout intent/plan  Therapist Response: Assessed pt's current functioning and reviewed progress.. Assisted pt processing stressors, negative thoughts.  Assisted pt processing for the management of her stressors.  Participation Level: Active  Diagnosis: Axis I.:  GAD    Follow Up Instructions: I discussed the assessment and treatment plan with the patient. The patient was provided an opportunity to ask questions and all were answered. The patient agreed with the plan and demonstrated an understanding of the instructions.   The patient was advised to call back or seek an in-person evaluation if the symptoms worsen or if the condition fails to improve as anticipated.  I provided 60 minutes of non-face-to-face time during this encounter.   Troi Bechtold S, LCAS

## 2019-11-28 MED FILL — AMOX-CLAV 500-125 MG TABLET: 500-125 | 14 days supply | Qty: 28 | Fill #0

## 2019-12-10 ENCOUNTER — Encounter (HOSPITAL_COMMUNITY): Payer: Self-pay | Admitting: Licensed Clinical Social Worker

## 2019-12-10 ENCOUNTER — Ambulatory Visit (INDEPENDENT_AMBULATORY_CARE_PROVIDER_SITE_OTHER): Payer: No Typology Code available for payment source | Admitting: Licensed Clinical Social Worker

## 2019-12-10 ENCOUNTER — Other Ambulatory Visit: Payer: Self-pay

## 2019-12-10 DIAGNOSIS — F411 Generalized anxiety disorder: Secondary | ICD-10-CM | POA: Diagnosis not present

## 2019-12-11 ENCOUNTER — Telehealth: Payer: Self-pay | Admitting: Family Medicine

## 2019-12-11 NOTE — Telephone Encounter (Signed)
Called pt, and she did not answer. Left a voicemail requesting that pt call back in order to schedule visit.

## 2019-12-11 NOTE — Telephone Encounter (Signed)
Reason for Call Request for General Office Information Initial Comment Caller states she is calling because she pulled something in her neck. She states she needs a referral in order to get a physical therapist. She states the way her insurance is set up she will need this referral. Additional Comment [facility]- [phone] Fax [phone] She states its a massage therapist/ physical therapist. She needs the referral filed to her insurance. Disp. Time Disposition Final User 12/10/2019 5:14:45 PM General Information Provided Yes [place]var[place], [place] Handler

## 2019-12-11 NOTE — Telephone Encounter (Signed)
Please advise this message 

## 2019-12-11 NOTE — Telephone Encounter (Signed)
Received referral for request for physical therapy/massage therapy for patient for neck pain.  Patient has not been seen by this provider for this condition in the past.  Please schedule patient for evaluation of condition and if appropriate will place referral at that time and treat.

## 2019-12-12 MED FILL — ADDERALL XR 25 MG CAPSULE: 25 | 30 days supply | Qty: 30 | Fill #0

## 2019-12-12 NOTE — Telephone Encounter (Signed)
Made second attempt to reach pt. Pt did not answer, and a voicemail was left requesting that pt return phone call.

## 2019-12-16 NOTE — Telephone Encounter (Signed)
Spoke with patient and her insurance made a mistake when saying she needed a referral. Nothing further needed at this time.

## 2019-12-16 NOTE — Progress Notes (Signed)
Virtual Visit via Video Note  I connected with Cindy Jones  On 12/10/19 at 2:00pm EDT by a video enabled telemedicine application and verified that I am speaking with the correct person using two identifiers.   I discussed the limitations of evaluation and management by telemedicine and the availability of in person appointments. The patient expressed understanding and agreed to proceed.  LOCATION: Patient:Home Provider: Home  History of Present Illness: Pt was referred to therapy by her psychiatrist at [facility] for anxiety and panic attacks.   Participation Level: Active   Type of Therapy: Virtual individual therapy  Treatment Goals addressed: Improve psychiatric symptoms, Controlled Behavior, Moderated Mood, Improve Unhelpful Thought Patterns, Emotional Regulation Skills (Moderate moods, anger management, stress management), Feel and express a full Range of Emotions, Learn about Diagnosis, Healthy Coping Skills.  Interventions: CBT/Supportive/Psychoeducation  Summary: Cindy Jones is a 29 y.o. female who presents anxious for her virtual individual counseling session. Patient discussed her psychiatric symptoms and current life events. Cln provided psychoeducation on effective coping skills for current stressors. Patient discussed her feelings about the relationship with her mother and her current desire to continue allowing her mother to join in her therapy session. Cln assessed family dynamics and asked clarifying questions. Explored reasons to continue therapy with her mother involved and reasons not to continue.Clinician utilized MI OARS to reflect and summarize thoughts and feelings.      PLAN: tourettes strategies, journal feelings    Assessment and plan: Counselor will continue to meet with patient to address treatment plan goals. Patient will continue to follow recommendations of providers and implement skills learned in session.  Suicidal/Homicidal: Nowithout  intent/plan  Therapist Response: Assessed pt's current functioning and reviewed progress.. Assisted pt processing stressors, negative thoughts.  Assisted pt processing for the management of her stressors.  Participation Level: Active  Diagnosis: Axis I.:  GAD    Follow Up Instructions: I discussed the assessment and treatment plan with the patient. The patient was provided an opportunity to ask questions and all were answered. The patient agreed with the plan and demonstrated an understanding of the instructions.   The patient was advised to call back or seek an in-person evaluation if the symptoms worsen or if the condition fails to improve as anticipated.  I provided 60 minutes of non-face-to-face time during this encounter.   Fitz Matsuo S, LCAS

## 2019-12-17 ENCOUNTER — Encounter (HOSPITAL_COMMUNITY): Payer: Self-pay | Admitting: Licensed Clinical Social Worker

## 2019-12-17 ENCOUNTER — Ambulatory Visit (INDEPENDENT_AMBULATORY_CARE_PROVIDER_SITE_OTHER): Payer: No Typology Code available for payment source | Admitting: Licensed Clinical Social Worker

## 2019-12-17 ENCOUNTER — Other Ambulatory Visit: Payer: Self-pay

## 2019-12-17 DIAGNOSIS — F411 Generalized anxiety disorder: Secondary | ICD-10-CM | POA: Diagnosis not present

## 2019-12-17 NOTE — Progress Notes (Signed)
Virtual Visit via Video Note  I connected with Cindy Jones  On 12/10/19 at 2:00pm EDT by a video enabled telemedicine application and verified that I am speaking with the correct person using two identifiers.   I discussed the limitations of evaluation and management by telemedicine and the availability of in person appointments. The patient expressed understanding and agreed to proceed.  LOCATION: Patient:Home Provider: Home  History of Present Illness: Pt was referred to therapy by her psychiatrist at [facility] for anxiety and panic attacks.   Participation Level: Active   Type of Therapy: Virtual individual therapy  Treatment Goals addressed: Improve psychiatric symptoms, Controlled Behavior, Moderated Mood, Improve Unhelpful Thought Patterns, Emotional Regulation Skills (Moderate moods, anger management, stress management), Feel and express a full Range of Emotions, Learn about Diagnosis, Healthy Coping Skills.  Interventions: CBT/Supportive/Psychoeducation  Summary: Cindy Jones is a 29 y.o. female who presents anxious for her virtual individual counseling session. Patient discussed her psychiatric symptoms and current life events. Cln provided psychoeducation on effective coping skills for current stressors. Again, patient explored her feelings about the relationship with her mother. Again, cln assessed family dynamics and asked clarifying questions. Explored with patient how to use boundaries with mother, where to allow her in her relationship circles, her feelings of guilt.        PLAN: tourettes strategies, journal feelings    Assessment and plan: Counselor will continue to meet with patient to address treatment plan goals. Patient will continue to follow recommendations of providers and implement skills learned in session.  Suicidal/Homicidal: Nowithout intent/plan  Therapist Response: Assessed pt's current functioning and reviewed progress.. Assisted pt processing  stressors, negative thoughts.  Assisted pt processing for the management of her stressors.  Participation Level: Active  Diagnosis: Axis I.:  GAD    Follow Up Instructions: I discussed the assessment and treatment plan with the patient. The patient was provided an opportunity to ask questions and all were answered. The patient agreed with the plan and demonstrated an understanding of the instructions.   The patient was advised to call back or seek an in-person evaluation if the symptoms worsen or if the condition fails to improve as anticipated.  I provided 60 minutes of non-face-to-face time during this encounter.   [name],[name], LCAS

## 2019-12-24 ENCOUNTER — Encounter (HOSPITAL_COMMUNITY): Payer: Self-pay | Admitting: Licensed Clinical Social Worker

## 2019-12-24 ENCOUNTER — Ambulatory Visit (INDEPENDENT_AMBULATORY_CARE_PROVIDER_SITE_OTHER): Payer: No Typology Code available for payment source | Admitting: Licensed Clinical Social Worker

## 2019-12-24 ENCOUNTER — Other Ambulatory Visit: Payer: Self-pay

## 2019-12-24 DIAGNOSIS — F411 Generalized anxiety disorder: Secondary | ICD-10-CM | POA: Diagnosis not present

## 2019-12-24 NOTE — Progress Notes (Signed)
Virtual Visit via Video Note  I connected with Cindy Jones  On 12/10/19 at 2:00pm EDT by a video enabled telemedicine application and verified that I am speaking with the correct person using two identifiers.   I discussed the limitations of evaluation and management by telemedicine and the availability of in person appointments. The patient expressed understanding and agreed to proceed.  LOCATION: Patient:Home Provider: Home  History of Present Illness: Pt was referred to therapy by her psychiatrist at [facility] for anxiety and panic attacks.   Participation Level: Active   Type of Therapy: Virtual individual therapy  Treatment Goals addressed: Improve psychiatric symptoms, Controlled Behavior, Moderated Mood, Improve Unhelpful Thought Patterns, Emotional Regulation Skills (Moderate moods, anger management, stress management), Feel and express a full Range of Emotions, Learn about Diagnosis, Healthy Coping Skills.  Interventions: CBT/Supportive/Psychoeducation  Summary: Cindy Jones is a 29 y.o. female who presents anxious for her virtual individual counseling session. Patient discussed her psychiatric symptoms and current life events. Cln provided psychoeducation on effective coping skills for current stressors. Patient began a journey of her memories of being sent to a therapeutic boarding school as a teenager and it's affects on her current mental health.  PLAN: boarding school, church anxiety    Assessment and plan: Counselor will continue to meet with patient to address treatment plan goals. Patient will continue to follow recommendations of providers and implement skills learned in session.  Suicidal/Homicidal: Nowithout intent/plan  Therapist Response: Assessed pt's current functioning and reviewed progress.. Assisted pt processing stressors, negative thoughts.  Assisted pt processing for the management of her stressors.  Participation Level: Active  Diagnosis: Axis I.:   GAD    Follow Up Instructions: I discussed the assessment and treatment plan with the patient. The patient was provided an opportunity to ask questions and all were answered. The patient agreed with the plan and demonstrated an understanding of the instructions.   The patient was advised to call back or seek an in-person evaluation if the symptoms worsen or if the condition fails to improve as anticipated.  I provided 60 minutes of non-face-to-face time during this encounter.   Valrie Jia S, LCAS

## 2019-12-25 ENCOUNTER — Encounter (HOSPITAL_COMMUNITY): Payer: Self-pay | Admitting: Psychiatry

## 2019-12-25 ENCOUNTER — Other Ambulatory Visit: Payer: Self-pay

## 2019-12-25 ENCOUNTER — Telehealth (HOSPITAL_COMMUNITY): Payer: No Typology Code available for payment source | Admitting: Psychiatry

## 2019-12-25 DIAGNOSIS — F988 Other specified behavioral and emotional disorders with onset usually occurring in childhood and adolescence: Secondary | ICD-10-CM

## 2019-12-25 DIAGNOSIS — F411 Generalized anxiety disorder: Secondary | ICD-10-CM

## 2019-12-25 DIAGNOSIS — F410 Panic disorder [episodic paroxysmal anxiety] without agoraphobia: Secondary | ICD-10-CM

## 2019-12-25 DIAGNOSIS — F952 Tourette's disorder: Secondary | ICD-10-CM

## 2019-12-25 NOTE — Progress Notes (Unsigned)
Virtual Visit via Video Note  I connected with Cindy Jones on 12/25/19 at  8:30 AM EDT by a video enabled telemedicine application and verified that I am speaking with the correct person using two identifiers.  Location: Patient: home Provider: office   I discussed the limitations of evaluation and management by telemedicine and the availability of in person appointments. The patient expressed understanding and agreed to proceed.  History of Present Illness: ***   Observations/Objective: Psychiatric Specialty Exam: ROS  There were no vitals taken for this visit.There is no height or weight on file to calculate BMI.  General Appearance: Neat and Well Groomed  Eye Contact:  Good  Speech:  Clear and Coherent and Normal Rate  Volume:  Normal  Mood:  Euthymic  Affect:  Full Range  Thought Process:  Coherent and Descriptions of Associations: Circumstantial  Orientation:  Full (Time, Place, and Person)  Thought Content:  Logical  Suicidal Thoughts:  No  Homicidal Thoughts:  No  Memory:  Immediate;   Good  Judgement:  Good  Insight:  Good  Psychomotor Activity:  Normal  Concentration:  Concentration: Good  Recall:  Good  Fund of Knowledge:  Good  Language:  Good  Akathisia:  No  Handed:  Right  AIMS (if indicated):     Assets:  Communication Skills Desire for Improvement Financial Resources/Insurance Housing Intimacy Leisure Time Physical Health Resilience Social Support Talents/Skills Transportation Vocational/Educational  ADL's:  Intact  Cognition:  WNL  Sleep:         I reviewed the information below on 12/25/19 and have updated it Assessment and Plan:  ADHD-inattentive type; Tourette's d/o; Panic attacks; Bulimia Nervosa; MDD- in remission   Status of current symptoms: doing well   Adderall XR 25mg  po qAM for ADHD.   Adderall 20mg  po qD prn ADHD for late afternoon for work days   Encouraged to continue therapy    Follow Up Instructions: In 2-3  months or sooner if needed   I discussed the assessment and treatment plan with the patient. The patient was provided an opportunity to ask questions and all were answered. The patient agreed with the plan and demonstrated an understanding of the instructions.   The patient was advised to call back or seek an in-person evaluation if the symptoms worsen or if the condition fails to improve as anticipated.  I provided 25 minutes of non-face-to-face time during this encounter.   , MD

## 2020-01-01 MED ORDER — AMPHETAMINE-DEXTROAMPHET ER 25 MG PO CP24: 25.0000 mg | ORAL_CAPSULE | Freq: Every day | ORAL | 0 refills | Status: DC

## 2020-01-01 MED ORDER — AMPHETAMINE-DEXTROAMPHET ER 25 MG PO CP24: 25.0000 mg | ORAL_CAPSULE | ORAL | 0 refills | Status: DC

## 2020-01-01 MED ORDER — AMPHETAMINE-DEXTROAMPHETAMINE 20 MG PO TABS: 20.0000 mg | ORAL_TABLET | Freq: Every day | ORAL | 0 refills | Status: DC | PRN

## 2020-01-01 MED FILL — AMPHETAMINE-DEXTROAMPHETAMI: 20 | 30 days supply | Qty: 30 | Fill #0

## 2020-01-13 MED FILL — ADDERALL XR 25 MG CAPSULE: 25 | 30 days supply | Qty: 30 | Fill #0

## 2020-01-14 ENCOUNTER — Other Ambulatory Visit: Payer: Self-pay

## 2020-01-14 ENCOUNTER — Ambulatory Visit (HOSPITAL_COMMUNITY): Payer: No Typology Code available for payment source | Admitting: Licensed Clinical Social Worker

## 2020-01-15 ENCOUNTER — Other Ambulatory Visit: Payer: Self-pay

## 2020-01-15 ENCOUNTER — Ambulatory Visit (INDEPENDENT_AMBULATORY_CARE_PROVIDER_SITE_OTHER): Payer: No Typology Code available for payment source | Admitting: Licensed Clinical Social Worker

## 2020-01-15 DIAGNOSIS — F411 Generalized anxiety disorder: Secondary | ICD-10-CM | POA: Diagnosis not present

## 2020-01-21 ENCOUNTER — Ambulatory Visit (INDEPENDENT_AMBULATORY_CARE_PROVIDER_SITE_OTHER): Payer: No Typology Code available for payment source | Admitting: Licensed Clinical Social Worker

## 2020-01-21 ENCOUNTER — Other Ambulatory Visit: Payer: Self-pay

## 2020-01-21 DIAGNOSIS — F411 Generalized anxiety disorder: Secondary | ICD-10-CM | POA: Diagnosis not present

## 2020-01-21 NOTE — Progress Notes (Signed)
Virtual Visit via Video Note  I connected with Cindy Jones  On 01/21/20 at 2:00pm EDT by a video enabled telemedicine application and verified that I am speaking with the correct person using two identifiers.   I discussed the limitations of evaluation and management by telemedicine and the availability of in person appointments. The patient expressed understanding and agreed to proceed.  LOCATION: Patient:Home Provider: Home  History of Present Illness: Pt was referred to therapy by her psychiatrist at [facility] for anxiety and panic attacks.   Participation Level: Active   Type of Therapy: Virtual individual therapy  Treatment Goals addressed: Improve psychiatric symptoms, Controlled Behavior, Moderated Mood, Improve Unhelpful Thought Patterns, Emotional Regulation Skills (Moderate moods, anger management, stress management), Feel and express a full Range of Emotions, Learn about Diagnosis, Healthy Coping Skills.  Interventions: CBT/Supportive/Psychoeducation  Summary: Cindy Jones is a 29 y.o. female who presents sad and tearful for her virtual individual counseling session. Patient discussed her psychiatric symptoms and current life events. Cln provided psychoeducation on effective coping skills for current stressors. Patient Still maintains a level of sadness for the death of her grandmother. Her sister has moved in temporarily to support each other in their grief, loss and sadness. Cln provided patient and her sister grief and loss workbooks. Reviewed the first few pages of the workbook with patient.     PLAN: boarding school, church anxiety, grief workbook    Assessment and plan: Counselor will continue to meet with patient to address treatment plan goals. Patient will continue to follow recommendations of providers and implement skills learned in session.  Suicidal/Homicidal: Nowithout intent/plan  Therapist Response: Assessed pt's current functioning and reviewed progress..  Assisted pt processing stressors, negative thoughts.  Assisted pt processing for the management of her stressors.  Participation Level: Active  Diagnosis: Axis I.:  GAD    Follow Up Instructions: I discussed the assessment and treatment plan with the patient. The patient was provided an opportunity to ask questions and all were answered. The patient agreed with the plan and demonstrated an understanding of the instructions.   The patient was advised to call back or seek an in-person evaluation if the symptoms worsen or if the condition fails to improve as anticipated.  I provided 60 minutes of non-face-to-face time during this encounter.   Sharan Mcenaney S, LCAS         Virtual Visit via Video Note  I connected with Cindy Jones  On 12/10/19 at 2:00pm EDT by a video enabled telemedicine application and verified that I am speaking with the correct person using two identifiers.   I discussed the limitations of evaluation and management by telemedicine and the availability of in person appointments. The patient expressed understanding and agreed to proceed.  LOCATION: Patient:Home Provider: Home  History of Present Illness: Pt was referred to therapy by her psychiatrist at [facility] for anxiety and panic attacks.   Participation Level: Active   Type of Therapy: Virtual individual therapy  Treatment Goals addressed: Improve psychiatric symptoms, Controlled Behavior, Moderated Mood, Improve Unhelpful Thought Patterns, Emotional Regulation Skills (Moderate moods, anger management, stress management), Feel and express a full Range of Emotions, Learn about Diagnosis, Healthy Coping Skills.  Interventions: CBT/Supportive/Psychoeducation  Summary: Cindy Jones is a 29 y.o. female who presents anxious for her virtual individual counseling session. Patient discussed her psychiatric symptoms and current life events. Cln provided psychoeducation on effective coping skills for current  stressors. Patient began a journey of her memories of being  sent to a therapeutic boarding school as a teenager and it's affects on her current mental health.  PLAN: boarding school, church anxiety    Assessment and plan: Counselor will continue to meet with patient to address treatment plan goals. Patient will continue to follow recommendations of providers and implement skills learned in session.  Suicidal/Homicidal: Nowithout intent/plan  Therapist Response: Assessed pt's current functioning and reviewed progress.. Assisted pt processing stressors, negative thoughts.  Assisted pt processing for the management of her stressors.  Participation Level: Active  Diagnosis: Axis I.:  GAD    Follow Up Instructions: I discussed the assessment and treatment plan with the patient. The patient was provided an opportunity to ask questions and all were answered. The patient agreed with the plan and demonstrated an understanding of the instructions.   The patient was advised to call back or seek an in-person evaluation if the symptoms worsen or if the condition fails to improve as anticipated.  I provided 60 minutes of non-face-to-face time during this encounter.   Kenry Daubert S, LCAS         Virtual Visit via Video Note  I connected with Cindy Jones  On 01/21/20 at 2:00pm EDT by a video enabled telemedicine application and verified that I am speaking with the correct person using two identifiers.   I discussed the limitations of evaluation and management by telemedicine and the availability of in person appointments. The patient expressed understanding and agreed to proceed.  LOCATION: Patient:Home Provider: Home  History of Present Illness: Pt was referred to therapy by her psychiatrist at [facility] for anxiety and panic attacks.   Participation Level: Active   Type of Therapy: Virtual individual therapy  Treatment Goals addressed: Improve psychiatric symptoms, Controlled  Behavior, Moderated Mood, Improve Unhelpful Thought Patterns, Emotional Regulation Skills (Moderate moods, anger management, stress management), Feel and express a full Range of Emotions, Learn about Diagnosis, Healthy Coping Skills.  Interventions: CBT/Supportive/Psychoeducation  Summary: Cindy Jones is a 29 y.o. female who presents anxious for her virtual individual counseling session. Patient discussed her psychiatric symptoms and current life events. Cln provided psychoeducation on effective coping skills for current stressors. Patient was angry during her session, "I'm angry with my mother who I feel was complicit in my grandmother not getting the COVID vaccine.    PLAN: boarding school, church anxiety    Assessment and plan: Counselor will continue to meet with patient to address treatment plan goals. Patient will continue to follow recommendations of providers and implement skills learned in session.  Suicidal/Homicidal: Nowithout intent/plan  Therapist Response: Assessed pt's current functioning and reviewed progress.. Assisted pt processing stressors, negative thoughts.  Assisted pt processing for the management of her stressors.  Participation Level: Active  Diagnosis: Axis I.:  GAD    Follow Up Instructions: I discussed the assessment and treatment plan with the patient. The patient was provided an opportunity to ask questions and all were answered. The patient agreed with the plan and demonstrated an understanding of the instructions.   The patient was advised to call back or seek an in-person evaluation if the symptoms worsen or if the condition fails to improve as anticipated.  I provided 60 minutes of non-face-to-face time during this encounter.   Kiyonna Tortorelli S, LCAS

## 2020-01-27 ENCOUNTER — Encounter (HOSPITAL_COMMUNITY): Payer: Self-pay | Admitting: Licensed Clinical Social Worker

## 2020-01-27 NOTE — Progress Notes (Signed)
Virtual Visit via Video Note  I connected with Cindy Jones  On 01/15/20 at 2:00pm EDT by a video enabled telemedicine application and verified that I am speaking with the correct person using two identifiers.   I discussed the limitations of evaluation and management by telemedicine and the availability of in person appointments. The patient expressed understanding and agreed to proceed.  LOCATION: Patient:Home Provider: Home  History of Present Illness: Pt was referred to therapy by her psychiatrist at [facility] for anxiety and panic attacks.   Participation Level: Active   Type of Therapy: Virtual individual therapy  Treatment Goals addressed: Improve psychiatric symptoms, Controlled Behavior, Moderated Mood, Improve Unhelpful Thought Patterns, Emotional Regulation Skills (Moderate moods, anger management, stress management), Feel and express a full Range of Emotions, Learn about Diagnosis, Healthy Coping Skills.  Interventions: CBT/Supportive/Psychoeducation  Summary: Cindy Jones is a 29 y.o. female who presents sad and angry for her virtual individual counseling session. Patient discussed her psychiatric symptoms and current life events. Patient shared her grandmother passed away from Covid. Cln provided a safe platform for her to talk about her feelings. "I'm angry at my mother who did not followup with care for my mother and didn't make it a priority for my grandma to get the vaccination." Patient described her grandmother's last days/hours. Used Socratic questions to allow patient to share her feelings in a safe environment.     PLAN: boarding school, church anxiety    Assessment and plan: Counselor will continue to meet with patient to address treatment plan goals. Patient will continue to follow recommendations of providers and implement skills learned in session.  Suicidal/Homicidal: Nowithout intent/plan  Therapist Response: Assessed pt's current functioning and reviewed  progress.. Assisted pt processing stressors, negative thoughts.  Assisted pt processing for the management of her stressors.  Participation Level: Active  Diagnosis: Axis I.:  GAD    Follow Up Instructions: I discussed the assessment and treatment plan with the patient. The patient was provided an opportunity to ask questions and all were answered. The patient agreed with the plan and demonstrated an understanding of the instructions.   The patient was advised to call back or seek an in-person evaluation if the symptoms worsen or if the condition fails to improve as anticipated.  I provided 60 minutes of non-face-to-face time during this encounter.   Stefani Baik S, LCAS

## 2020-01-28 ENCOUNTER — Other Ambulatory Visit: Payer: Self-pay

## 2020-01-28 ENCOUNTER — Encounter (HOSPITAL_COMMUNITY): Payer: Self-pay | Admitting: Licensed Clinical Social Worker

## 2020-01-28 ENCOUNTER — Ambulatory Visit (INDEPENDENT_AMBULATORY_CARE_PROVIDER_SITE_OTHER): Payer: No Typology Code available for payment source | Admitting: Licensed Clinical Social Worker

## 2020-01-28 DIAGNOSIS — F411 Generalized anxiety disorder: Secondary | ICD-10-CM

## 2020-01-29 MED FILL — AMPHETAMINE-DEXTROAMPHETAMI: 20 | 30 days supply | Qty: 30 | Fill #0

## 2020-02-02 ENCOUNTER — Encounter (HOSPITAL_COMMUNITY): Payer: Self-pay | Admitting: Licensed Clinical Social Worker

## 2020-02-02 NOTE — Progress Notes (Signed)
Virtual Visit via Video Note  I connected with Cindy Jones  On 01/21/20 at 2:00pm EDT by a video enabled telemedicine application and verified that I am speaking with the correct person using two identifiers.   I discussed the limitations of evaluation and management by telemedicine and the availability of in person appointments. The patient expressed understanding and agreed to proceed.  LOCATION: Patient:Home Provider: Home  History of Present Illness: Pt was referred to therapy by her psychiatrist at [facility] for anxiety and panic attacks.   Participation Level: Active   Type of Therapy: Virtual individual therapy  Treatment Goals addressed: Improve psychiatric symptoms, Controlled Behavior, Moderated Mood, Improve Unhelpful Thought Patterns, Emotional Regulation Skills (Moderate moods, anger management, stress management), Feel and express a full Range of Emotions, Learn about Diagnosis, Healthy Coping Skills.  Interventions: CBT/Supportive/Psychoeducation  Summary: Cindy Jones is a 29 y.o. female who presents sad and tearful for her virtual individual counseling session. Patient discussed her psychiatric symptoms and current life events. Cln provided psychoeducation on effective coping skills for current stressors. Patient Still maintains a level of sadness for the death of her grandmother. Her sister has moved in temporarily to support each other in their grief, loss and sadness. Cln provided patient and her sister grief and loss workbooks. Reviewed the first few pages of the workbook with patient.     PLAN: boarding school, church anxiety, grief workbook    Assessment and plan: Counselor will continue to meet with patient to address treatment plan goals. Patient will continue to follow recommendations of providers and implement skills learned in session.  Suicidal/Homicidal: Nowithout intent/plan  Therapist Response: Assessed pt's current functioning and reviewed progress..  Assisted pt processing stressors, negative thoughts.  Assisted pt processing for the management of her stressors.  Participation Level: Active  Diagnosis: Axis I.:  GAD    Follow Up Instructions: I discussed the assessment and treatment plan with the patient. The patient was provided an opportunity to ask questions and all were answered. The patient agreed with the plan and demonstrated an understanding of the instructions.   The patient was advised to call back or seek an in-person evaluation if the symptoms worsen or if the condition fails to improve as anticipated.  I provided 60 minutes of non-face-to-face time during this encounter.   Comfort Iversen S, LCAS         Virtual Visit via Video Note  I connected with Cindy Jones  On 12/10/19 at 2:00pm EDT by a video enabled telemedicine application and verified that I am speaking with the correct person using two identifiers.   I discussed the limitations of evaluation and management by telemedicine and the availability of in person appointments. The patient expressed understanding and agreed to proceed.  LOCATION: Patient:Home Provider: Home  History of Present Illness: Pt was referred to therapy by her psychiatrist at [facility] for anxiety and panic attacks.   Participation Level: Active   Type of Therapy: Virtual individual therapy  Treatment Goals addressed: Improve psychiatric symptoms, Controlled Behavior, Moderated Mood, Improve Unhelpful Thought Patterns, Emotional Regulation Skills (Moderate moods, anger management, stress management), Feel and express a full Range of Emotions, Learn about Diagnosis, Healthy Coping Skills.  Interventions: CBT/Supportive/Psychoeducation  Summary: Cindy Jones is a 29 y.o. female who presents anxious for her virtual individual counseling session. Patient discussed her psychiatric symptoms and current life events. Cln provided psychoeducation on effective coping skills for current  stressors. Patient began a journey of her memories of being  sent to a therapeutic boarding school as a teenager and it's affects on her current mental health.  PLAN: boarding school, church anxiety    Assessment and plan: Counselor will continue to meet with patient to address treatment plan goals. Patient will continue to follow recommendations of providers and implement skills learned in session.  Suicidal/Homicidal: Nowithout intent/plan  Therapist Response: Assessed pt's current functioning and reviewed progress.. Assisted pt processing stressors, negative thoughts.  Assisted pt processing for the management of her stressors.  Participation Level: Active  Diagnosis: Axis I.:  GAD    Follow Up Instructions: I discussed the assessment and treatment plan with the patient. The patient was provided an opportunity to ask questions and all were answered. The patient agreed with the plan and demonstrated an understanding of the instructions.   The patient was advised to call back or seek an in-person evaluation if the symptoms worsen or if the condition fails to improve as anticipated.  I provided 60 minutes of non-face-to-face time during this encounter.   Cindy Jones S, LCAS         Virtual Visit via Video Note  I connected with Cindy Jones  On 01/21/20 at 1:00pm EDT by a video enabled telemedicine application and verified that I am speaking with the correct person using two identifiers.   I discussed the limitations of evaluation and management by telemedicine and the availability of in person appointments. The patient expressed understanding and agreed to proceed.  LOCATION: Patient:Home Provider: Home  History of Present Illness: Pt was referred to therapy by her psychiatrist at [facility] for anxiety and panic attacks.   Participation Level: Active   Type of Therapy: Virtual individual therapy  Treatment Goals addressed: Improve psychiatric symptoms, Controlled  Behavior, Moderated Mood, Improve Unhelpful Thought Patterns, Emotional Regulation Skills (Moderate moods, anger management, stress management), Feel and express a full Range of Emotions, Learn about Diagnosis, Healthy Coping Skills.  Interventions: CBT/Supportive/Psychoeducation  Summary: CYRIAH CHILDREY is a 29 y.o. female who presents anxious for her virtual individual counseling session. Patient discussed her psychiatric symptoms and current life events. Cln provided psychoeducation on effective coping skills for current stressors. Patient discussed the relationship with her sister, who continues to stay with her. Asked open ended questions. Role played with patient communication styles with sister. Used Socratic questions.Patient asked her husband to join session to discuss communication errors. Patient and her husband communicated with each other, where cln pointed out communication errors and had them realize their errors and fix them to communicate effectively.   PLAN: boarding school, church anxiety    Assessment and plan: Counselor will continue to meet with patient to address treatment plan goals. Patient will continue to follow recommendations of providers and implement skills learned in session.  Suicidal/Homicidal: Nowithout intent/plan  Therapist Response: Assessed pt's current functioning and reviewed progress.. Assisted pt processing stressors, negative thoughts.  Assisted pt processing for the management of her stressors.  Participation Level: Active  Diagnosis: Axis I.:  GAD    Follow Up Instructions: I discussed the assessment and treatment plan with the patient. The patient was provided an opportunity to ask questions and all were answered. The patient agreed with the plan and demonstrated an understanding of the instructions.   The patient was advised to call back or seek an in-person evaluation if the symptoms worsen or if the condition fails to improve as  anticipated.  I provided 60 minutes of non-face-to-face time during this encounter.   Zakyia Gagan S, LCAS

## 2020-02-04 ENCOUNTER — Other Ambulatory Visit: Payer: Self-pay

## 2020-02-04 ENCOUNTER — Ambulatory Visit (INDEPENDENT_AMBULATORY_CARE_PROVIDER_SITE_OTHER): Payer: No Typology Code available for payment source | Admitting: Licensed Clinical Social Worker

## 2020-02-04 ENCOUNTER — Encounter (HOSPITAL_COMMUNITY): Payer: Self-pay | Admitting: Licensed Clinical Social Worker

## 2020-02-04 DIAGNOSIS — F411 Generalized anxiety disorder: Secondary | ICD-10-CM | POA: Diagnosis not present

## 2020-02-04 NOTE — Progress Notes (Signed)
Virtual Visit via Video Note  I connected with Cindy Jones  On 02/04/20 at 2:00pm EDT by a video enabled telemedicine application and verified that I am speaking with the correct person using two identifiers.   I discussed the limitations of evaluation and management by telemedicine and the availability of in person appointments. The patient expressed understanding and agreed to proceed.  LOCATION: Patient:Home Provider: Home  History of Present Illness: Pt was referred to therapy by her psychiatrist at [facility] for anxiety and panic attacks.   Participation Level: Active   Type of Therapy: Virtual individual therapy  Treatment Goals addressed: Improve psychiatric symptoms, Controlled Behavior, Moderated Mood, Improve Unhelpful Thought Patterns, Emotional Regulation Skills (Moderate moods, anger management, stress management), Feel and express a full Range of Emotions, Learn about Diagnosis, Healthy Coping Skills.  Interventions: CBT/Supportive/Psychoeducation  Summary: Cindy Jones is a 29 y.o. female who presents anxious for her virtual individual counseling session. Patient discussed her psychiatric symptoms and current life events. Cln provided psychoeducation on effective coping skills for current stressors. Patient reports her moods have been stable for the last week, however there are continued communication errors with patient and her husband about church attendance. Role played communicating errors with patient, intent listening, listening without response, role voice plays in communicating. Cln provided suggestions on communication errors. Pt was receptive.         PLAN: boarding school, church anxiety, grief workbook    Assessment and plan: Counselor will continue to meet with patient to address treatment plan goals. Patient will continue to follow recommendations of providers and implement skills learned in session.  Suicidal/Homicidal: Nowithout intent/plan  Therapist  Response: Assessed pt's current functioning and reviewed progress.. Assisted pt processing stressors, negative thoughts.  Assisted pt processing for the management of her stressors.  Participation Level: Active  Diagnosis: Axis I.:  GAD    Follow Up Instructions: I discussed the assessment and treatment plan with the patient. The patient was provided an opportunity to ask questions and all were answered. The patient agreed with the plan and demonstrated an understanding of the instructions.   The patient was advised to call back or seek an in-person evaluation if the symptoms worsen or if the condition fails to improve as anticipated.  I provided 45 minutes of non-face-to-face time during this encounter.   Marcia Hartwell S, LCAS

## 2020-02-09 MED FILL — ADDERALL XR 25 MG CAPSULE: 25 | 30 days supply | Qty: 30 | Fill #0

## 2020-02-11 ENCOUNTER — Encounter (HOSPITAL_COMMUNITY): Payer: Self-pay | Admitting: Licensed Clinical Social Worker

## 2020-02-11 ENCOUNTER — Other Ambulatory Visit: Payer: Self-pay

## 2020-02-11 ENCOUNTER — Ambulatory Visit (INDEPENDENT_AMBULATORY_CARE_PROVIDER_SITE_OTHER): Payer: No Typology Code available for payment source | Admitting: Licensed Clinical Social Worker

## 2020-02-11 DIAGNOSIS — F411 Generalized anxiety disorder: Secondary | ICD-10-CM | POA: Diagnosis not present

## 2020-02-11 NOTE — Progress Notes (Signed)
Virtual Visit via Video Note  I connected with Cindy Jones  On 02/11/20 at 1:00pm EDT by a video enabled telemedicine application and verified that I am speaking with the correct person using two identifiers.   I discussed the limitations of evaluation and management by telemedicine and the availability of in person appointments. The patient expressed understanding and agreed to proceed.  LOCATION: Patient:Home Provider: Home  History of Present Illness: Pt was referred to therapy by her psychiatrist at [facility] for anxiety and panic attacks.   Participation Level: Active   Type of Therapy: Virtual individual therapy  Treatment Goals addressed: Improve psychiatric symptoms, Controlled Behavior, Moderated Mood, Improve Unhelpful Thought Patterns, Emotional Regulation Skills (Moderate moods, anger management, stress management), Feel and express a full Range of Emotions, Learn about Diagnosis, Healthy Coping Skills.  Interventions: CBT/Supportive/Psychoeducation  Summary: Cindy Jones is a 29 y.o. female who presents anxious for her virtual individual counseling session. Patient discussed her psychiatric symptoms and current life events. Cln provided psychoeducation on effective coping skills for current stressors. Patient reports her moods have been up/down for the last week, however there are continued communication errors with patient and her husband about church attendance.Her husband joined the session. Provided a safe platform for patient and her husband to talk to each other about the role church plays in their lives.   PLAN: boarding school, church anxiety, grief workbook    Assessment and plan: Counselor will continue to meet with patient to address treatment plan goals. Patient will continue to follow recommendations of providers and implement skills learned in session.  Suicidal/Homicidal: Nowithout intent/plan  Therapist Response: Assessed pt's current functioning and reviewed  progress.. Assisted pt processing stressors, negative thoughts.  Assisted pt processing for the management of her stressors.  Participation Level: Active  Diagnosis: Axis I.:  GAD    Follow Up Instructions: I discussed the assessment and treatment plan with the patient. The patient was provided an opportunity to ask questions and all were answered. The patient agreed with the plan and demonstrated an understanding of the instructions.   The patient was advised to call back or seek an in-person evaluation if the symptoms worsen or if the condition fails to improve as anticipated.  I provided 60 minutes of non-face-to-face time during this encounter.   Jasman Pfeifle S, LCAS

## 2020-02-18 ENCOUNTER — Ambulatory Visit (HOSPITAL_COMMUNITY): Payer: No Typology Code available for payment source | Admitting: Licensed Clinical Social Worker

## 2020-02-18 ENCOUNTER — Other Ambulatory Visit: Payer: Self-pay

## 2020-02-26 MED FILL — AMPHETAMINE-DEXTROAMPHETAMI: 20 | 30 days supply | Qty: 30 | Fill #0

## 2020-03-03 ENCOUNTER — Other Ambulatory Visit: Payer: Self-pay

## 2020-03-03 ENCOUNTER — Ambulatory Visit (INDEPENDENT_AMBULATORY_CARE_PROVIDER_SITE_OTHER): Payer: No Typology Code available for payment source | Admitting: Licensed Clinical Social Worker

## 2020-03-03 DIAGNOSIS — F411 Generalized anxiety disorder: Secondary | ICD-10-CM

## 2020-03-04 ENCOUNTER — Encounter (HOSPITAL_COMMUNITY): Payer: Self-pay | Admitting: Licensed Clinical Social Worker

## 2020-03-04 NOTE — Progress Notes (Signed)
Virtual Visit via Video Note  I connected with Cindy Jones  On 03/03/20 at 1:00pm EDT by a video enabled telemedicine application and verified that I am speaking with the correct person using two identifiers.   I discussed the limitations of evaluation and management by telemedicine and the availability of in person appointments. The patient expressed understanding and agreed to proceed.  LOCATION: Patient:Home Provider: Home  History of Present Illness: Pt was referred to therapy by her psychiatrist at [facility] for anxiety and panic attacks.   Participation Level: Active   Type of Therapy: Virtual individual therapy  Treatment Goals addressed: Improve psychiatric symptoms, Controlled Behavior, Moderated Mood, Improve Unhelpful Thought Patterns, Emotional Regulation Skills (Moderate moods, anger management, stress management), Feel and express a full Range of Emotions, Learn about Diagnosis, Healthy Coping Skills.  Interventions: CBT/Supportive/Psychoeducation  Summary: Cindy Jones is a 29 y.o. female who presents WNL for her virtual individual counseling session. Patient discussed her psychiatric symptoms and current life events. Cln provided psychoeducation on effective coping skills for current stressors. Patient reports she went to church Sunday. She described her anxiety symptoms beginning with getting dressed to leaving the church service. Cln provided psychoeducation on coping skills for each step of the process: breathing, mindfulness, visualization. Patient discussed her mother. "I've still not talked to her." Cln provided a safe platform for patient to express her feelings about the part she feels that her mother made in the death of her grandmother. Validated her feelings.   PLAN: boarding school, church anxiety, grief workbook    Assessment and plan: Counselor will continue to meet with patient to address treatment plan goals. Patient will continue to follow recommendations of  providers and implement skills learned in session.  Suicidal/Homicidal: Nowithout intent/plan  Therapist Response: Assessed pt's current functioning and reviewed progress.. Assisted pt processing stressors, negative thoughts.  Assisted pt processing for the management of her stressors.  Participation Level: Active  Diagnosis: Axis I.:  GAD    Follow Up Instructions: I discussed the assessment and treatment plan with the patient. The patient was provided an opportunity to ask questions and all were answered. The patient agreed with the plan and demonstrated an understanding of the instructions.   The patient was advised to call back or seek an in-person evaluation if the symptoms worsen or if the condition fails to improve as anticipated.  I provided 60 minutes of non-face-to-face time during this encounter.   Siyon Linck S, LCAS

## 2020-03-09 MED FILL — ADDERALL XR 25 MG CAPSULE: 25 | 30 days supply | Qty: 30 | Fill #0

## 2020-03-10 ENCOUNTER — Ambulatory Visit (INDEPENDENT_AMBULATORY_CARE_PROVIDER_SITE_OTHER): Payer: No Typology Code available for payment source | Admitting: Licensed Clinical Social Worker

## 2020-03-10 ENCOUNTER — Encounter (HOSPITAL_COMMUNITY): Payer: Self-pay | Admitting: Licensed Clinical Social Worker

## 2020-03-10 ENCOUNTER — Other Ambulatory Visit: Payer: Self-pay

## 2020-03-10 DIAGNOSIS — F411 Generalized anxiety disorder: Secondary | ICD-10-CM | POA: Diagnosis not present

## 2020-03-10 NOTE — Progress Notes (Signed)
Virtual Visit via Video Note  I connected with Cindy Jones  On 03/10/20 at 1:00pm EST by a video enabled telemedicine application and verified that I am speaking with the correct person using two identifiers.   I discussed the limitations of evaluation and management by telemedicine and the availability of in person appointments. The patient expressed understanding and agreed to proceed.  LOCATION: Patient:Home Provider: Home Office  History of Present Illness: Pt was referred to therapy by her psychiatrist at [facility] for anxiety and panic attacks.   Participation Level: Active   Type of Therapy: Virtual individual therapy  Treatment Goals addressed: Improve psychiatric symptoms, Controlled Behavior, Moderated Mood, Improve Unhelpful Thought Patterns, Emotional Regulation Skills (Moderate moods, anger management, stress management), Feel and express a full Range of Emotions, Learn about Diagnosis, Healthy Coping Skills.  Interventions: CBT/Supportive/Psychoeducation  Summary: Cindy Jones is a 29 y.o. female who presents WNL for her virtual individual counseling session. Patient discussed her psychiatric symptoms and current life events. Cln provided psychoeducation on effective coping skills for current stressors. Patient's husband joined the session briefly to problem-solve an issue with the holidays. Patient and her husband attempted to problem-solve during session. Redirected them to stay on topic and helped move them forward in problem solving. They were able to solve the problem and patient became tearful in thinking about a holiday without her grandmother. While patient became tearful her husband reached out to comfort her, pointing out her acceptance of the comfort. The session ended well.     PLAN: boarding school, church anxiety, grief workbook    Assessment and plan: Counselor will continue to meet with patient to address treatment plan goals. Patient will continue to follow  recommendations of providers and implement skills learned in session.  Suicidal/Homicidal: Nowithout intent/plan  Therapist Response: Assessed pt's current functioning and reviewed progress.. Assisted pt processing stressors, negative thoughts.  Assisted pt processing for the management of her stressors.  Participation Level: Active  Diagnosis: Axis I.:  GAD    Follow Up Instructions: I discussed the assessment and treatment plan with the patient. The patient was provided an opportunity to ask questions and all were answered. The patient agreed with the plan and demonstrated an understanding of the instructions.   The patient was advised to call back or seek an in-person evaluation if the symptoms worsen or if the condition fails to improve as anticipated.  I provided 45 minutes of non-face-to-face time during this encounter.   Jamare Vanatta S, LCAS

## 2020-03-17 ENCOUNTER — Encounter (HOSPITAL_COMMUNITY): Payer: Self-pay | Admitting: Licensed Clinical Social Worker

## 2020-03-17 ENCOUNTER — Ambulatory Visit (INDEPENDENT_AMBULATORY_CARE_PROVIDER_SITE_OTHER): Payer: No Typology Code available for payment source | Admitting: Licensed Clinical Social Worker

## 2020-03-17 ENCOUNTER — Other Ambulatory Visit: Payer: Self-pay

## 2020-03-17 DIAGNOSIS — F411 Generalized anxiety disorder: Secondary | ICD-10-CM

## 2020-03-17 NOTE — Progress Notes (Signed)
Virtual Visit via Video Note  I connected with Cindy Jones  On 03/17/20 at 1:00pm EST by a video enabled telemedicine application and verified that I am speaking with the correct person using two identifiers.   I discussed the limitations of evaluation and management by telemedicine and the availability of in person appointments. The patient expressed understanding and agreed to proceed.  LOCATION: Patient:Home Provider: Home Office  History of Present Illness: Pt was referred to therapy by her psychiatrist at [facility] for anxiety and panic attacks.   Participation Level: Active   Type of Therapy: Virtual individual therapy  Treatment Goals addressed: Improve psychiatric symptoms, Controlled Behavior, Moderated Mood, Improve Unhelpful Thought Patterns, Emotional Regulation Skills (Moderate moods, anger management, stress management), Feel and express a full Range of Emotions, Learn about Diagnosis, Healthy Coping Skills.  Interventions: CBT/Supportive/Psychoeducation  Summary: Cindy Jones is a 29 y.o. female who presents WNL for her virtual individual counseling session. Patient discussed her psychiatric symptoms and current life events. Cln provided psychoeducation on effective coping skills for current stressors. Patient is considering getting an additional dog. Cln assisted with pros and cons of additional dog, in relation to her stress, being in grad school, working a full-time job. Pt reports she went to church. Cln worked through the process of going to church and experiencing anxiety. Patient discussed the upcoming holidays and family. Patient and her mother are estranged and she will be at the holiday gathering. Role played possible scenarios at the holiday gatherings.    Assessment and plan: Counselor will continue to meet with patient to address treatment plan goals. Patient will continue to follow recommendations of providers and implement skills learned in  session.  Suicidal/Homicidal: Nowithout intent/plan  Therapist Response: Assessed [name] current functioning and reviewed progress.. Assisted pt processing stressors, negative thoughts.  Assisted pt processing for the management of her stressors.  Participation Level: Active  Diagnosis: Axis I.:  GAD    Follow Up Instructions: I discussed the assessment and treatment plan with the patient. The patient was provided an opportunity to ask questions and all were answered. The patient agreed with the plan and demonstrated an understanding of the instructions.   The patient was advised to call back or seek an in-person evaluation if the symptoms worsen or if the condition fails to improve as anticipated.  I provided 60 minutes of non-face-to-face time during this encounter.   Cindy Jones, LCAS

## 2020-03-18 ENCOUNTER — Other Ambulatory Visit (HOSPITAL_COMMUNITY): Payer: Self-pay | Admitting: Psychiatry

## 2020-03-18 ENCOUNTER — Other Ambulatory Visit: Payer: Self-pay

## 2020-03-18 ENCOUNTER — Telehealth (HOSPITAL_COMMUNITY): Payer: No Typology Code available for payment source | Admitting: Psychiatry

## 2020-03-18 DIAGNOSIS — F988 Other specified behavioral and emotional disorders with onset usually occurring in childhood and adolescence: Secondary | ICD-10-CM

## 2020-03-18 MED ORDER — AMPHETAMINE-DEXTROAMPHETAMINE 20 MG PO TABS: 20.0000 mg | ORAL_TABLET | Freq: Every day | ORAL | 0 refills | Status: DC | PRN

## 2020-03-18 MED ORDER — AMPHETAMINE-DEXTROAMPHET ER 25 MG PO CP24: 25.0000 mg | ORAL_CAPSULE | ORAL | 0 refills | Status: DC

## 2020-03-18 MED ORDER — AMPHETAMINE-DEXTROAMPHET ER 25 MG PO CP24: 25.0000 mg | ORAL_CAPSULE | Freq: Every day | ORAL | 0 refills | Status: DC

## 2020-03-18 NOTE — Progress Notes (Unsigned)
Virtual Visit via Telephone Note  I connected with Cindy Jones on 03/18/20 at  8:30 AM EST by telephone and verified that I am speaking with the correct person using two identifiers.  Location: Patient: home Provider: office   I discussed the limitations, risks, security and privacy concerns of performing an evaluation and management service by telephone and the availability of in person appointments. I also discussed with the patient that there may be a patient responsible charge related to this service. The patient expressed understanding and agreed to proceed.   History of Present Illness: Aloha states she has had a lot of stress since our last visit. Her grandmother died of COVID recently. 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 It was very difficult dealing with her family. Her classes are going ok. She is stressing and has a lot of anxiety about it. Brian is denying any panic attacks.She has friends she studies with and is going therapy appointments. Shakoya is still actively engaging in self care. Raley has decided to double up on classes so that she can finish school in 1 year. She knows it will be tough but feels prepared. Tasheika continues to work from home and it is going well. Her focus and productivity are good with Adderall.   She gets bored with projects easily and is working on making to [employer]. She denies any depression. Tylesha has some days, usually during her periods, where she gets down on herself. It lasts for a few hours and is able to move on with distractions. Her eye blinking tics seem to be happening more. She notes a change in a vision and it leads to a migraine. Mertice takes actions to try to decrease the intensity of the migraine. Jemya denies any binging and starving episodes. She is eating well but will skip one meal a few times a month. She is walking 2 hrs/day. She struggles with thoughts of feeling fat on/off.    Observations/Objective:  General Appearance: unable to assess  Eye Contact:  unable to assess  Speech:  {Speech:22685}  Volume:  {Volume (PAA):22686}  Mood:  {BHH MOOD:22306}  Affect:  {Affect (PAA):22687}  Thought Process:  {Thought Process (PAA):22688}  Orientation:  {BHH ORIENTATION (PAA):22689}  Thought Content:  {Thought Content:22690}  Suicidal Thoughts:  {ST/HT (PAA):22692}  Homicidal Thoughts:  {ST/HT (PAA):22692}  Memory:  {BHH MEMORY:22881}  Judgement:  {Judgement (PAA):22694}  Insight:  {Insight (PAA):22695}  Psychomotor Activity: unable to assess  Concentration:  {Concentration:21399}  Recall:  {BHH GOOD/FAIR/POOR:22877}  Fund of Knowledge:  {BHH GOOD/FAIR/POOR:22877}  Language:  {BHH GOOD/FAIR/POOR:22877}  Akathisia:  unable to assess  Handed:  {Handed:22697}  AIMS (if indicated):     Assets:  {Assets (PAA):22698}  ADL's:  unable to assess  Cognition:  {chl bhh cognition:304700322}  Sleep:         Assessment and Plan:  ADHD- inattentive type; Tourette's disorder; Panic attacks; Bulimia Nervosa; MDD- in remission  Adderall XR 25mg  po qAM for ADHD  Adderall 20mg  po qD prn ADHD for late afternoon for work days  She feels therapy is going well  Follow Up Instructions: In 3 months or sooner if needed   I discussed the assessment and treatment plan with the patient. The patient was provided an opportunity to ask questions and all were answered. The patient agreed with the plan and demonstrated an understanding of the instructions.   The patient was advised to call back or seek  an in-person evaluation if the symptoms worsen or if the condition fails to improve as anticipated.  I provided 20 minutes of non-face-to-face time during this encounter.   Oletta Darter, MD

## 2020-03-24 ENCOUNTER — Encounter (HOSPITAL_COMMUNITY): Payer: Self-pay | Admitting: Licensed Clinical Social Worker

## 2020-03-24 ENCOUNTER — Ambulatory Visit (INDEPENDENT_AMBULATORY_CARE_PROVIDER_SITE_OTHER): Payer: No Typology Code available for payment source | Admitting: Licensed Clinical Social Worker

## 2020-03-24 ENCOUNTER — Other Ambulatory Visit: Payer: Self-pay

## 2020-03-24 DIAGNOSIS — F411 Generalized anxiety disorder: Secondary | ICD-10-CM

## 2020-03-24 NOTE — Progress Notes (Signed)
Virtual Visit via Video Note  I connected with Cindy Jones  On 03/24/20 at 1:00pm EST by a video enabled telemedicine application and verified that I am speaking with the correct person using two identifiers.   I discussed the limitations of evaluation and management by telemedicine and the availability of in person appointments. The patient expressed understanding and agreed to proceed.  LOCATION: Patient:Home Provider: Home Office  History of Present Illness: Pt was referred to therapy by her psychiatrist at [facility] for anxiety and panic attacks.   Participation Level: Active   Type of Therapy: Virtual individual therapy  Treatment Goals addressed: Improve psychiatric symptoms, Controlled Behavior, Moderated Mood, Improve Unhelpful Thought Patterns, Emotional Regulation Skills (Moderate moods, anger management, stress management), Feel and express a full Range of Emotions, Learn about Diagnosis, Healthy Coping Skills.  Interventions: CBT/Supportive/Psychoeducation  Summary: Cindy Jones is a 29 y.o. female who presents anxious for her virtual individual counseling session. Patient discussed her psychiatric symptoms and current life events. Cln provided psychoeducation on effective coping skills for current stressors. Patient discussed her concerns about the upcoming thanksgiving holiday. "I don't know how to be around my mom. I don't want to talk to her." Clinician utilized MI OARS to reflect and summarize her thoughts and feelings. Cln and pt role played possible scenarios at family holiday gathering. Cln provided psychoeducation on effective communication skills and using boundaries. Role played boundary setting at gathering.    Assessment and plan: Counselor will continue to meet with patient to address treatment plan goals. Patient will continue to follow recommendations of providers and implement skills learned in session.  Suicidal/Homicidal: Nowithout intent/plan  Therapist  Response: Assessed pt's current functioning and reviewed progress.. Assisted pt processing stressors, negative thoughts.  Assisted pt processing for the management of her stressors.  Participation Level: Active  Diagnosis: Axis I.:  GAD    Follow Up Instructions: I discussed the assessment and treatment plan with the patient. The patient was provided an opportunity to ask questions and all were answered. The patient agreed with the plan and demonstrated an understanding of the instructions.   The patient was advised to call back or seek an in-person evaluation if the symptoms worsen or if the condition fails to improve as anticipated.  I provided 60 minutes of non-face-to-face time during this encounter.   Isay Perleberg S, LCAS

## 2020-03-25 MED FILL — AMPHETAMINE-DEXTROAMPHETAMI: 20 | 30 days supply | Qty: 30 | Fill #0

## 2020-03-31 ENCOUNTER — Encounter (HOSPITAL_COMMUNITY): Payer: Self-pay | Admitting: Licensed Clinical Social Worker

## 2020-03-31 ENCOUNTER — Other Ambulatory Visit: Payer: Self-pay

## 2020-03-31 ENCOUNTER — Ambulatory Visit (INDEPENDENT_AMBULATORY_CARE_PROVIDER_SITE_OTHER): Payer: No Typology Code available for payment source | Admitting: Licensed Clinical Social Worker

## 2020-03-31 DIAGNOSIS — F411 Generalized anxiety disorder: Secondary | ICD-10-CM

## 2020-03-31 NOTE — Progress Notes (Signed)
Virtual Visit via Video Note  I connected with Cindy Jones  On 04/23/20 at 1:00pm EST by a video enabled telemedicine application and verified that I am speaking with the correct person using two identifiers.   I discussed the limitations of evaluation and management by telemedicine and the availability of in person appointments. The patient expressed understanding and agreed to proceed.  LOCATION: Patient:Home Provider: Home Office  History of Present Illness: Pt was referred to therapy by her psychiatrist at [facility] for anxiety and panic attacks.   Participation Level: Active   Type of Therapy: Virtual Video individual therapy  Treatment Goals addressed: Improve psychiatric symptoms, Controlled Behavior, Moderated Mood, Improve Unhelpful Thought Patterns, Emotional Regulation Skills (Moderate moods, anger management, stress management), Feel and express a full Range of Emotions, Learn about Diagnosis, Healthy Coping Skills.  Interventions: CBT/Supportive/Psychoeducation  Summary: Cindy Jones is a 29 y.o. female who presents anxious and exhausted for her virtual video individual counseling session. Patient discussed her psychiatric symptoms and current life events. Cln provided psychoeducation on effective coping skills for current stressors. Patient discussed her thanksgiving holiday and stressors with family, especially mother.  Clinician utilized CBT to process her thoughts and feelings. Patient reports she is exhausted from school work, which is increasing her anxiety. Discussed with pt accommodations for her school. Suggested she contact [employer] to find out  More information on accommodations.           Assessment and plan: Counselor will continue to meet with patient to address treatment plan goals. Patient will continue to follow recommendations of providers and implement skills learned in session.  Suicidal/Homicidal: Nowithout intent/plan  Therapist Response: Assessed pt's  current functioning and reviewed progress.. Assisted pt processing stressors, negative thoughts.  Assisted pt processing for the management of her stressors.  Participation Level: Active  Diagnosis: Axis I.:  GAD    Follow Up Instructions: I discussed the assessment and treatment plan with the patient. The patient was provided an opportunity to ask questions and all were answered. The patient agreed with the plan and demonstrated an understanding of the instructions.   The patient was advised to call back or seek an in-person evaluation if the symptoms worsen or if the condition fails to improve as anticipated.  I provided 30 minutes of non-face-to-face time during this encounter.   Candy Leverett S, LCAS

## 2020-04-03 MED FILL — ADDERALL XR 25 MG CAPSULE: 25 | 30 days supply | Qty: 30 | Fill #0

## 2020-04-07 ENCOUNTER — Other Ambulatory Visit: Payer: Self-pay

## 2020-04-07 ENCOUNTER — Encounter (HOSPITAL_COMMUNITY): Payer: Self-pay | Admitting: Licensed Clinical Social Worker

## 2020-04-07 ENCOUNTER — Ambulatory Visit (INDEPENDENT_AMBULATORY_CARE_PROVIDER_SITE_OTHER): Payer: No Typology Code available for payment source | Admitting: Licensed Clinical Social Worker

## 2020-04-07 DIAGNOSIS — F411 Generalized anxiety disorder: Secondary | ICD-10-CM | POA: Diagnosis not present

## 2020-04-07 NOTE — Progress Notes (Signed)
Virtual Visit via Video Note  I connected with Cindy Jones  On 04/23/20 at 1:00pm EST by a video enabled telemedicine application and verified that I am speaking with the correct person using two identifiers.   I discussed the limitations of evaluation and management by telemedicine and the availability of in person appointments. The patient expressed understanding and agreed to proceed.  LOCATION: Patient:Home Provider: Home Office  History of Present Illness: Pt was referred to therapy by her psychiatrist at [facility] for anxiety and panic attacks.   Participation Level: Active   Type of Therapy: Virtual Video individual therapy  Treatment Goals addressed: Improve psychiatric symptoms, Controlled Behavior, Moderated Mood, Improve Unhelpful Thought Patterns, Emotional Regulation Skills (Moderate moods, anger management, stress management), Feel and express a full Range of Emotions, Learn about Diagnosis, Healthy Coping Skills.  Interventions: CBT/Supportive/Psychoeducation  Summary: Cindy Jones is a 29 y.o. female who presents anxious  for her virtual video individual counseling session. Patient discussed her psychiatric symptoms and current life events. Cln provided psychoeducation on effective coping skills for current stressors. Patient reports her biggest stressor, school, is over, the class is finished. Patient described her reactions to her biggest stressor: crying, frustration with husband,  Arguing, sleeplessness. Cln provided psychoedcuation on using mindfulness-based stress response to assist patient in lowering her stress response. Patient was encouraged to use mindfulness skills as a coping skill. Patient asked her husband to join session. Together they discussed some communication errors they experienced during the past week. Cln modeled effective communication skills for patients to use in talking to each other using I statements.           Assessment and plan:  Counselor will continue to meet with patient to address treatment plan goals. Patient will continue to follow recommendations of providers and implement skills learned in session.  Suicidal/Homicidal: Nowithout intent/plan  Therapist Response: Assessed pt's current functioning and reviewed progress.. Assisted pt processing stressors, negative thoughts.  Assisted pt processing for the management of her stressors.  Participation Level: Active  Diagnosis: Axis I.:  GAD    Follow Up Instructions: I discussed the assessment and treatment plan with the patient. The patient was provided an opportunity to ask questions and all were answered. The patient agreed with the plan and demonstrated an understanding of the instructions.   The patient was advised to call back or seek an in-person evaluation if the symptoms worsen or if the condition fails to improve as anticipated.  I provided 30 minutes of non-face-to-face time during this encounter.   Audley Hinojos S, LCAS

## 2020-04-14 ENCOUNTER — Ambulatory Visit (INDEPENDENT_AMBULATORY_CARE_PROVIDER_SITE_OTHER): Payer: No Typology Code available for payment source | Admitting: Licensed Clinical Social Worker

## 2020-04-14 ENCOUNTER — Encounter (HOSPITAL_COMMUNITY): Payer: Self-pay | Admitting: Licensed Clinical Social Worker

## 2020-04-14 ENCOUNTER — Other Ambulatory Visit: Payer: Self-pay

## 2020-04-14 DIAGNOSIS — F411 Generalized anxiety disorder: Secondary | ICD-10-CM

## 2020-04-14 NOTE — Progress Notes (Signed)
Virtual Visit via Video Note  I connected with Cindy Jones  On 04/14/20 at 1:00pm EST by a video enabled telemedicine application and verified that I am speaking with the correct person using two identifiers.   I discussed the limitations of evaluation and management by telemedicine and the availability of in person appointments. The patient expressed understanding and agreed to proceed.  LOCATION: Patient:Home Provider: Home Office  History of Present Illness: Pt was referred to therapy by her psychiatrist at [facility] for anxiety and panic attacks.   Participation Level: Active   Type of Therapy: Virtual Video individual therapy  Treatment Goals addressed: Improve psychiatric symptoms, Controlled Behavior, Moderated Mood, Improve Unhelpful Thought Patterns, Emotional Regulation Skills (Moderate moods, anger management, stress management), Feel and express a full Range of Emotions, Learn about Diagnosis, Healthy Coping Skills.  Interventions: CBT/Supportive/Psychoeducation  Summary: Cindy Jones is a 29 y.o. female who presents WNL  for her virtual video individual counseling session. Patient discussed her psychiatric symptoms and current life events. Cln provided psychoeducation on effective coping skills for current stressors. Reviewed tx plan with pt who verbalized acceptance of the plan. "Now that school is out my stress has been minimal. I continue to practice my copings skills because school will be starting again in January." Reviewed with patient what coping skills work for current biggest stressor: school. Assessment and plan: Counselor will continue to meet with patient to address treatment plan goals. Patient will continue to follow recommendations of providers and implement skills learned in session.  Suicidal/Homicidal: Nowithout intent/plan  Therapist Response: Assessed pt's current functioning and reviewed progress.. Assisted pt processing stressors, negative thoughts.   Assisted pt processing for the management of her stressors.  Participation Level: Active  Diagnosis: Axis I.:  GAD    Follow Up Instructions: I discussed the assessment and treatment plan with the patient. The patient was provided an opportunity to ask questions and all were answered. The patient agreed with the plan and demonstrated an understanding of the instructions.   The patient was advised to call back or seek an in-person evaluation if the symptoms worsen or if the condition fails to improve as anticipated.  I provided 60 minutes of non-face-to-face time during this encounter.   Aloysius Heinle S, LCAS

## 2020-04-20 ENCOUNTER — Other Ambulatory Visit: Payer: No Typology Code available for payment source

## 2020-04-20 ENCOUNTER — Other Ambulatory Visit: Payer: Self-pay

## 2020-04-20 DIAGNOSIS — Z20822 Contact with and (suspected) exposure to covid-19: Secondary | ICD-10-CM

## 2020-04-21 ENCOUNTER — Ambulatory Visit (INDEPENDENT_AMBULATORY_CARE_PROVIDER_SITE_OTHER): Payer: No Typology Code available for payment source | Admitting: Licensed Clinical Social Worker

## 2020-04-21 ENCOUNTER — Other Ambulatory Visit: Payer: No Typology Code available for payment source

## 2020-04-21 ENCOUNTER — Other Ambulatory Visit: Payer: Self-pay

## 2020-04-21 ENCOUNTER — Encounter (HOSPITAL_COMMUNITY): Payer: Self-pay | Admitting: Licensed Clinical Social Worker

## 2020-04-21 DIAGNOSIS — F411 Generalized anxiety disorder: Secondary | ICD-10-CM

## 2020-04-22 LAB — SARS-COV-2, NAA 2 DAY TAT

## 2020-04-22 LAB — NOVEL CORONAVIRUS, NAA: SARS-CoV-2, NAA: NOT DETECTED

## 2020-04-22 LAB — SPECIMEN STATUS REPORT

## 2020-04-22 NOTE — Progress Notes (Signed)
Virtual Visit via Video Note  I connected with Cindy Jones  On 04/21/20 at 1:00pm EST by a video enabled telemedicine application and verified that I am speaking with the correct person using two identifiers.   I discussed the limitations of evaluation and management by telemedicine and the availability of in person appointments. The patient expressed understanding and agreed to proceed.  LOCATION: Patient:Home Provider: Home Office  History of Present Illness: Pt was referred to therapy by her psychiatrist at [facility] for anxiety and panic attacks.   Participation Level: Active   Type of Therapy: Virtual Video individual therapy  Treatment Goals addressed: Improve psychiatric symptoms, Controlled Behavior, Moderated Mood, Improve Unhelpful Thought Patterns, Emotional Regulation Skills (Moderate moods, anger management, stress management), Feel and express a full Range of Emotions, Learn about Diagnosis, Healthy Coping Skills.  Interventions: CBT/Supportive/Psychoeducation  Summary: Cindy Jones is a 29 y.o. female who presents WNL  for her virtual video individual counseling session. Patient discussed her psychiatric symptoms and current life events. Cln provided psychoeducation on effective coping skills for current stressors. Patient's stress continues to be minimal due to her biggest stressor: school, being out until January. "My new stressor is my exposure to Covid. My family Christmas celebration is at my house!" Cln assisted patient with identifying other ways to celebrate the holidays during Covid pandemic. Challenged patient to identify personal goals for the upcoming year. Cln provided psychoeducation on SMART golas. Encouraged patient to get a new planner for 2022.     Assessment and plan: Counselor will continue to meet with patient to address treatment plan goals. Patient will continue to follow recommendations of providers and implement skills learned in  session.  Suicidal/Homicidal: Nowithout intent/plan  Therapist Response: Assessed pt's current functioning and reviewed progress.. Assisted pt processing stressors, negative thoughts.  Assisted pt processing for the management of her stressors.  Participation Level: Active  Diagnosis: Axis I.:  GAD    Follow Up Instructions: I discussed the assessment and treatment plan with the patient. The patient was provided an opportunity to ask questions and all were answered. The patient agreed with the plan and demonstrated an understanding of the instructions.   The patient was advised to call back or seek an in-person evaluation if the symptoms worsen or if the condition fails to improve as anticipated.  I provided 60 minutes of non-face-to-face time during this encounter.   Ferrin Liebig S, LCAS

## 2020-05-03 MED FILL — ADDERALL XR 25 MG CAPSULE: 25 | 30 days supply | Qty: 30 | Fill #0

## 2020-05-03 MED FILL — AMPHETAMINE-DEXTROAMPHETAMI: 20 | 30 days supply | Qty: 30 | Fill #0

## 2020-05-05 ENCOUNTER — Other Ambulatory Visit: Payer: Self-pay

## 2020-05-05 ENCOUNTER — Ambulatory Visit (INDEPENDENT_AMBULATORY_CARE_PROVIDER_SITE_OTHER): Payer: No Typology Code available for payment source | Admitting: Licensed Clinical Social Worker

## 2020-05-05 ENCOUNTER — Encounter (HOSPITAL_COMMUNITY): Payer: Self-pay | Admitting: Licensed Clinical Social Worker

## 2020-05-05 DIAGNOSIS — F411 Generalized anxiety disorder: Secondary | ICD-10-CM

## 2020-05-05 NOTE — Progress Notes (Signed)
Virtual Visit via Video Note  I connected with Cindy Jones  On 05/05/20 at 1:00pm EST by a video enabled telemedicine application and verified that I am speaking with the correct person using two identifiers.   I discussed the limitations of evaluation and management by telemedicine and the availability of in person appointments. The patient expressed understanding and agreed to proceed.  LOCATION: Patient:Home Provider: Home Office  History of Present Illness: Pt was referred to therapy by her psychiatrist at [facility] for anxiety and panic attacks.   Participation Level: Active   Type of Therapy: Virtual Video individual therapy  Treatment Goals addressed: Improve psychiatric symptoms, Controlled Behavior, Moderated Mood, Improve Unhelpful Thought Patterns, Emotional Regulation Skills (Moderate moods, anger management, stress management), Feel and express a full Range of Emotions, Learn about Diagnosis, Healthy Coping Skills.  Interventions: CBT/Supportive/Psychoeducation  Summary: Cindy Jones is a 30 y.o. female who presents WNL for her virtual video individual counseling session. Patient discussed her psychiatric symptoms and current life events. Cln provided psychoeducation on effective coping skills for current stressors. Pt reports on her holiday stress and what coping skills she used. "I saw my mom." Used socratic questions. Cln assisted patient identifying next steps moving forward with the relationship with her mother. Pt discussed her school starts next week, which may become an added stressor.    PLAN: healthy eating, planner    Assessment and plan: Counselor will continue to meet with patient to address treatment plan goals. Patient will continue to follow recommendations of providers and implement skills learned in session.  Suicidal/Homicidal: Nowithout intent/plan  Therapist Response: Assessed [name] current functioning and reviewed progress.. Assisted pt processing  stressors, negative thoughts.  Assisted pt processing for the management of her stressors.  Participation Level: Active  Diagnosis: Axis I.:  GAD    Follow Up Instructions: I discussed the assessment and treatment plan with the patient. The patient was provided an opportunity to ask questions and all were answered. The patient agreed with the plan and demonstrated an understanding of the instructions.   The patient was advised to call back or seek an in-person evaluation if the symptoms worsen or if the condition fails to improve as anticipated.  I provided 60 minutes of non-face-to-face time during this encounter.   Cindy Jones, LCAS

## 2020-05-10 ENCOUNTER — Encounter (HOSPITAL_BASED_OUTPATIENT_CLINIC_OR_DEPARTMENT_OTHER): Payer: Self-pay | Admitting: *Deleted

## 2020-05-10 ENCOUNTER — Emergency Department (HOSPITAL_BASED_OUTPATIENT_CLINIC_OR_DEPARTMENT_OTHER): Payer: No Typology Code available for payment source

## 2020-05-10 ENCOUNTER — Other Ambulatory Visit: Payer: Self-pay

## 2020-05-10 DIAGNOSIS — Z91040 Latex allergy status: Secondary | ICD-10-CM | POA: Insufficient documentation

## 2020-05-10 DIAGNOSIS — G43909 Migraine, unspecified, not intractable, without status migrainosus: Secondary | ICD-10-CM | POA: Insufficient documentation

## 2020-05-10 DIAGNOSIS — J45909 Unspecified asthma, uncomplicated: Secondary | ICD-10-CM | POA: Diagnosis not present

## 2020-05-10 DIAGNOSIS — Z7951 Long term (current) use of inhaled steroids: Secondary | ICD-10-CM | POA: Insufficient documentation

## 2020-05-10 DIAGNOSIS — Z7982 Long term (current) use of aspirin: Secondary | ICD-10-CM | POA: Insufficient documentation

## 2020-05-10 NOTE — ED Triage Notes (Signed)
Migraine x 4 days. No relief with OTC meds.  Vomiting today. Pt crying in triage.  Reports that it feels similar to her normal migraines but has lasted longer than normal.

## 2020-05-11 ENCOUNTER — Encounter (HOSPITAL_BASED_OUTPATIENT_CLINIC_OR_DEPARTMENT_OTHER): Payer: Self-pay | Admitting: Emergency Medicine

## 2020-05-11 ENCOUNTER — Emergency Department (HOSPITAL_BASED_OUTPATIENT_CLINIC_OR_DEPARTMENT_OTHER)
Admission: EM | Admit: 2020-05-11 | Discharge: 2020-05-11 | Disposition: A | Discharge status: Home / Self Care | Payer: No Typology Code available for payment source | Attending: Emergency Medicine | Admitting: Emergency Medicine

## 2020-05-11 DIAGNOSIS — G43809 Other migraine, not intractable, without status migrainosus: Secondary | ICD-10-CM

## 2020-05-11 MED ORDER — DIPHENHYDRAMINE HCL 50 MG/ML IJ SOLN
12.5000 mg | Freq: Once | INTRAMUSCULAR | Status: AC
  Administered 2020-05-11: 12.5 mg via INTRAVENOUS
  Filled 2020-05-11: qty 1

## 2020-05-11 MED ORDER — MAGNESIUM SULFATE 2 GM/50ML IV SOLN
2.0000 g | Freq: Once | INTRAVENOUS | Status: AC
  Administered 2020-05-11: 2 g via INTRAVENOUS
  Filled 2020-05-11: qty 50

## 2020-05-11 MED ORDER — METOCLOPRAMIDE HCL 5 MG/ML IJ SOLN
10.0000 mg | Freq: Once | INTRAMUSCULAR | Status: AC
  Administered 2020-05-11: 10 mg via INTRAVENOUS
  Filled 2020-05-11: qty 2

## 2020-05-11 NOTE — ED Provider Notes (Signed)
[facility] EMERGENCY DEPARTMENT Provider Note   CSN: [id number] Arrival date & time: 05/10/20  2116     History Chief Complaint  Patient presents with  . Migraine    Cindy Jones is a 30 y.o. female.  The history is provided by the patient.  Migraine This is a recurrent problem. The current episode started more than 2 days ago. The problem occurs constantly. The problem has not changed since onset.Associated symptoms include headaches. Pertinent negatives include no chest pain, no abdominal pain and no shortness of breath. Nothing aggravates the symptoms. Nothing relieves the symptoms. Treatments tried: excedrin. The treatment provided no relief.  Behind right eye.  It is associated with menstrual cycle.  No f/c/r.  No weakness, no numbness.  No changes in vision or speech.  No proptosis.  No changes in thinking     Past Medical History:  Diagnosis Date  . Anxiety   . Asthma    as a child  . Binge eating disorder   . Dysrhythmia    " I feel it about once a month"  . Frequent headaches   . GAD (generalized anxiety disorder)   . GERD (gastroesophageal reflux disease)   . Major depression in complete remission (HCC)   . Migraines   . PID (acute pelvic inflammatory disease)   . Seasonal allergies   . Tourette disorder   . Urinary tract bacterial infections     Patient Active Problem List   Diagnosis Date Noted  . Exercise-induced asthma 10/27/2019  . Encounter for preventive care 10/27/2019  . Friable cervix 06/09/2019  . History of acute PID 06/09/2019  . GAD (generalized anxiety disorder) 11/07/2018  . Overweight (BMI 25.0-29.9) 06/03/2018  . Bulimia 04/20/2015  . ADD (attention deficit disorder) 01/15/2014  . Tourette disease 10/18/2013    Past Surgical History:  Procedure Laterality Date  . CHOLECYSTECTOMY    . ESOPHAGOGASTRODUODENOSCOPY    . LAPAROSCOPIC CHOLECYSTECTOMY SINGLE SITE WITH INTRAOPERATIVE CHOLANGIOGRAM N/A 03/15/2016   Procedure:  LAPAROSCOPIC CHOLECYSTECTOMY SINGLE SITE WITH INTRAOPERATIVE CHOLANGIOGRAM;  Surgeon: Karie Soda, MD;  Location: [facility] OR;  Service: General;  Laterality: N/A;  . TONSILLECTOMY AND ADENOIDECTOMY  2013  . WISDOM TOOTH EXTRACTION       OB History    Gravida  1   Para  0   Term  0   Preterm  0   AB  1   Living  0     SAB  1   IAB  0   Ectopic  0   Multiple  0   Live Births              Family History  Problem Relation Age of Onset  . Alcohol abuse Mother   . Bipolar disorder Mother   . Cancer Father        sarcoma; passed away when pt was 8  . Tourette syndrome Maternal Aunt   . Alcohol abuse Maternal Aunt   . Hyperlipidemia Paternal Aunt   . Tourette syndrome Maternal Grandfather   . Heart disease Paternal Grandfather   . Alcohol abuse Maternal Uncle   . Suicidality Neg Hx     Social History   Tobacco Use  . Smoking status: Never Smoker  . Smokeless tobacco: Never Used  Vaping Use  . Vaping Use: Never used  Substance Use Topics  . Alcohol use: Yes    Alcohol/week: 1.0 standard drink    Types: 1 Glasses of wine per week  Comment: occ  . Drug use: No    Home Medications Prior to Admission medications   Medication Sig Start Date End Date Taking? Authorizing Provider  albuterol (VENTOLIN HFA) 108 (90 Base) MCG/ACT inhaler 1-2 puffs 15 minutes prior to exercise. 10/27/19   Kuneff, Renee A, DO  amphetamine-dextroamphetamine (ADDERALL XR) 25 MG 24 hr capsule Take 1 capsule by mouth daily. 03/18/20   Oletta Darter, MD  amphetamine-dextroamphetamine (ADDERALL XR) 25 MG 24 hr capsule Take 1 capsule by mouth every morning. 03/18/20   Oletta Darter, MD  amphetamine-dextroamphetamine (ADDERALL XR) 25 MG 24 hr capsule Take 1 capsule by mouth every morning. 03/18/20   Oletta Darter, MD  amphetamine-dextroamphetamine (ADDERALL) 20 MG tablet Take 1 tablet (20 mg total) by mouth daily as needed. 03/18/20 03/18/21  Oletta Darter, MD   amphetamine-dextroamphetamine (ADDERALL) 20 MG tablet Take 1 tablet (20 mg total) by mouth daily as needed. 03/18/20 03/18/21  Oletta Darter, MD  amphetamine-dextroamphetamine (ADDERALL) 20 MG tablet Take 1 tablet (20 mg total) by mouth daily as needed. 03/18/20 03/18/21  Oletta Darter, MD  aspirin-acetaminophen-caffeine [facility] MIGRAINE) [phone] MG per tablet Take 1-2 tablets by mouth every 6 (six) hours as needed for headache.     [provider]  MELATONIN ER PO Take by mouth.    [provider]    Allergies    Latex, Cranberry juice powder, Naproxen, Nickel, and Sulfa antibiotics  Review of Systems   Review of Systems  Constitutional: Negative for fever.  HENT: Negative for congestion.   Eyes: Negative for visual disturbance.  Respiratory: Negative for shortness of breath.   Cardiovascular: Negative for chest pain.  Gastrointestinal: Negative for abdominal pain.  Genitourinary: Negative for difficulty urinating.  Musculoskeletal: Negative for arthralgias.  Skin: Negative for color change.  Neurological: Positive for headaches. Negative for dizziness, facial asymmetry, speech difficulty, weakness and numbness.  Psychiatric/Behavioral: Negative for agitation.  All other systems reviewed and are negative.   Physical Exam Updated Vital Signs BP (!) 148/90 (BP Location: Left Arm)   Pulse (!) 101   Temp 98.1 F (36.7 C) (Oral)   Resp 18   Ht 5\' 6"  (1.676 m)   Wt 77.1 kg   SpO2 99%   BMI 27.44 kg/m   Physical Exam Vitals and nursing note reviewed.  Constitutional:      General: She is not in acute distress.    Appearance: Normal appearance.     Comments: Sitting room comfortably with all the lights on  HENT:     Head: Normocephalic and atraumatic.     Nose: Nose normal.  Eyes:     Extraocular Movements: Extraocular movements intact.     Conjunctiva/sclera: Conjunctivae normal.     Pupils: Pupils are equal, round, and reactive to light.      Comments: No proptosis, disks are sharp cognition is intact.    Cardiovascular:     Rate and Rhythm: Normal rate and regular rhythm.     Pulses: Normal pulses.     Heart sounds: Normal heart sounds.  Pulmonary:     Effort: Pulmonary effort is normal.     Breath sounds: Normal breath sounds.  Abdominal:     General: Abdomen is flat. Bowel sounds are normal.     Palpations: Abdomen is soft.     Tenderness: There is no abdominal tenderness. There is no guarding.  Musculoskeletal:        General: Normal range of motion.     Cervical back: Normal range of  motion and neck supple. No rigidity.  Skin:    General: Skin is warm and dry.     Capillary Refill: Capillary refill takes less than 2 seconds.  Neurological:     General: No focal deficit present.     Mental Status: She is alert and oriented to person, place, and time.     Deep Tendon Reflexes: Reflexes normal.  Psychiatric:        Mood and Affect: Mood normal.        Behavior: Behavior normal.     ED Results / Procedures / Treatments   Labs (all labs ordered are listed, but only abnormal results are displayed) Labs Reviewed  PREGNANCY, URINE    EKG None  Radiology CT Head Wo Contrast  Result Date: 05/11/2020 CLINICAL DATA:  Migraine for 4 days EXAM: CT HEAD WITHOUT CONTRAST TECHNIQUE: Contiguous axial images were obtained from the base of the skull through the vertex without intravenous contrast. COMPARISON:  None. FINDINGS: Brain: There is no mass, hemorrhage or extra-axial collection. The size and configuration of the ventricles and extra-axial CSF spaces are normal. The brain parenchyma is normal, without acute or chronic infarction. Vascular: No abnormal hyperdensity of the major intracranial arteries or dural venous sinuses. No intracranial atherosclerosis. Skull: The visualized skull base, calvarium and extracranial soft tissues are normal. Sinuses/Orbits: No fluid levels or advanced mucosal thickening of the visualized  paranasal sinuses. No mastoid or middle ear effusion. The orbits are normal. IMPRESSION: Normal head CT. Electronically Signed   By: Deatra Robinson M.D.   On: 05/11/2020 00:36    Procedures Procedures (including critical care time)  Medications Ordered in ED Medications  metoCLOPramide (REGLAN) injection 10 mg (has no administration in time range)  diphenhydrAMINE (BENADRYL) injection 12.5 mg (has no administration in time range)  magnesium sulfate IVPB 2 g 50 mL (has no administration in time range)    ED Course  I have reviewed the triage vital signs and the nursing notes.  Pertinent labs & imaging results that were available during my care of the patient were reviewed by me and considered in my medical decision making (see chart for details).    Resting comfortably post medication.  No sudden onset not the worst HA.  CT is normal.  Will refer to a headache specialist.   ARIANA JUUL was evaluated in Emergency Department on 05/11/2020 for the symptoms described in the history of present illness. She was evaluated in the context of the global COVID-19 pandemic, which necessitated consideration that the patient might be at risk for infection with the SARS-CoV-2 virus that causes COVID-19. Institutional protocols and algorithms that pertain to the evaluation of patients at risk for COVID-19 are in a state of rapid change based on information released by regulatory bodies including the CDC and federal and state organizations. These policies and algorithms were followed during the patient's care in the ED.  Final Clinical Impression(s) / ED Diagnoses Return for intractable cough, coughing up blood,fevers >100.4 unrelieved by medication, shortness of breath, intractable vomiting, chest pain, shortness of breath, weakness,numbness, changes in speech, facial asymmetry,abdominal pain, passing out,Inability to tolerate liquids or food, cough, altered mental status or any concerns. No signs of  systemic illness or infection. The patient is nontoxic-appearing on exam and vital signs are within normal limits.   I have reviewed the triage vital signs and the nursing notes. Pertinent labs &imaging results that were available during my care of the patient were reviewed by me and considered  in my medical decision making (see chart for details).After history, exam, and medical workup I feel the patient has beenappropriately medically screened and is safe for discharge home. Pertinent diagnoses were discussed with the patient. Patient was given return precautions.     Chandler Stofer, MD 05/11/20 [phone]

## 2020-05-12 ENCOUNTER — Ambulatory Visit (INDEPENDENT_AMBULATORY_CARE_PROVIDER_SITE_OTHER): Payer: No Typology Code available for payment source | Admitting: Licensed Clinical Social Worker

## 2020-05-12 ENCOUNTER — Encounter (HOSPITAL_COMMUNITY): Payer: Self-pay | Admitting: Licensed Clinical Social Worker

## 2020-05-12 ENCOUNTER — Other Ambulatory Visit: Payer: Self-pay

## 2020-05-12 DIAGNOSIS — F411 Generalized anxiety disorder: Secondary | ICD-10-CM | POA: Diagnosis not present

## 2020-05-12 NOTE — Progress Notes (Signed)
Virtual Visit via Video Note  I connected with Cindy Jones  On 05/12/20 at 1:00pm EST by a video enabled telemedicine application and verified that I am speaking with the correct person using two identifiers.   I discussed the limitations of evaluation and management by telemedicine and the availability of in person appointments. The patient expressed understanding and agreed to proceed.  LOCATION: Patient:Home Provider: Home Office  History of Present Illness: Pt was referred to therapy by her psychiatrist at [facility] for anxiety and panic attacks.   Participation Level: Active   Type of Therapy: Virtual Video individual therapy  Treatment Goals addressed: Improve psychiatric symptoms, Controlled Behavior, Moderated Mood, Improve Unhelpful Thought Patterns, Emotional Regulation Skills (Moderate moods, anger management, stress management), Feel and express a full Range of Emotions, Learn about Diagnosis, Healthy Coping Skills.  Interventions: CBT/Supportive/Psychoeducation  Summary: Cindy Jones is a 30 y.o. female who presents anxious for her virtual video individual counseling session. Patient discussed her psychiatric symptoms and current life events. Cln provided psychoeducation on effective coping skills for current stressors. Pt reports she was in the ER last night due to debilitating migraine. She was referred to a neurologist. Pt reports starting school this week, but "I'm not stressed, but I will be." Cln explored with pt coping skills to use while in the classroom for anxiety. Pt completed homework of identifying areas to be addressed to further a relationship with her mother. Cln reviewed list with patient and next step will be to "soften the words." Will begin this step at next session.     PLAN: CCA    Assessment and plan: Counselor will continue to meet with patient to address treatment plan goals. Patient will continue to follow recommendations of providers and implement  skills learned in session.  Suicidal/Homicidal: Nowithout intent/plan  Therapist Response: Assessed [name] current functioning and reviewed progress.. Assisted pt processing stressors, negative thoughts.  Assisted pt processing for the management of her stressors.  Participation Level: Active  Diagnosis: Axis I.:  GAD    Follow Up Instructions: I discussed the assessment and treatment plan with the patient. The patient was provided an opportunity to ask questions and all were answered. The patient agreed with the plan and demonstrated an understanding of the instructions.   The patient was advised to call back or seek an in-person evaluation if the symptoms worsen or if the condition fails to improve as anticipated.  I provided 60 minutes of non-face-to-face time during this encounter.   Cindy Jones, LCAS

## 2020-05-19 ENCOUNTER — Other Ambulatory Visit: Payer: Self-pay

## 2020-05-19 ENCOUNTER — Ambulatory Visit (INDEPENDENT_AMBULATORY_CARE_PROVIDER_SITE_OTHER): Payer: No Typology Code available for payment source | Admitting: Licensed Clinical Social Worker

## 2020-05-19 ENCOUNTER — Encounter (HOSPITAL_COMMUNITY): Payer: Self-pay | Admitting: Licensed Clinical Social Worker

## 2020-05-19 DIAGNOSIS — F411 Generalized anxiety disorder: Secondary | ICD-10-CM

## 2020-05-19 NOTE — Progress Notes (Deleted)
Virtual Visit via Video Note  I connected with Cindy Jones  On 05/12/20 at 1:00pm EST by a video enabled telemedicine application and verified that I am speaking with the correct person using two identifiers.   I discussed the limitations of evaluation and management by telemedicine and the availability of in person appointments. The patient expressed understanding and agreed to proceed.  LOCATION: Patient:Home Provider: Home Office  History of Present Illness: Pt was referred to therapy by her psychiatrist at [facility] for anxiety and panic attacks.   Participation Level: Active   Type of Therapy: Virtual Video individual therapy  Treatment Goals addressed: Improve psychiatric symptoms, Controlled Behavior, Moderated Mood, Improve Unhelpful Thought Patterns, Emotional Regulation Skills (Moderate moods, anger management, stress management), Feel and express a full Range of Emotions, Learn about Diagnosis, Healthy Coping Skills.  Interventions: CBT/Supportive/Psychoeducation  Summary: [name] is a 30 y.o. female who presents anxious for her virtual video individual counseling session. Patient discussed her psychiatric symptoms and current life events. Cln provided psychoeducation on effective coping skills for current stressors. Pt reports she was in the ER last night due to debilitating migraine. She was referred to a neurologist. Pt reports starting school this week, but "I'm not stressed, but I will be." Cln explored with pt coping skills to use while in the classroom for anxiety. Pt completed homework of identifying areas to be addressed to further a relationship with her mother. Cln reviewed list with patient and next step will be to "soften the words." Will begin this step at next session.     PLAN: CCA    Assessment and plan: Counselor will continue to meet with patient to address treatment plan goals. Patient will continue to follow recommendations of providers and implement  skills learned in session.  Suicidal/Homicidal: Nowithout intent/plan  Therapist Response: Assessed pt's current functioning and reviewed progress.. Assisted pt processing stressors, negative thoughts.  Assisted pt processing for the management of her stressors.  Participation Level: Active  Diagnosis: Axis I.:  GAD    Follow Up Instructions: I discussed the assessment and treatment plan with the patient. The patient was provided an opportunity to ask questions and all were answered. The patient agreed with the plan and demonstrated an understanding of the instructions.   The patient was advised to call back or seek an in-person evaluation if the symptoms worsen or if the condition fails to improve as anticipated.  I provided 60 minutes of non-face-to-face time during this encounter.   Kage Willmann S, LCAS            
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
## 2020-05-26 ENCOUNTER — Other Ambulatory Visit: Payer: Self-pay

## 2020-05-26 ENCOUNTER — Ambulatory Visit (HOSPITAL_COMMUNITY): Payer: No Typology Code available for payment source | Admitting: Licensed Clinical Social Worker

## 2020-05-31 MED FILL — ADDERALL XR 25 MG CAPSULE: 25 | 30 days supply | Qty: 30 | Fill #0

## 2020-06-02 ENCOUNTER — Encounter (HOSPITAL_COMMUNITY): Payer: Self-pay | Admitting: Licensed Clinical Social Worker

## 2020-06-02 ENCOUNTER — Ambulatory Visit (INDEPENDENT_AMBULATORY_CARE_PROVIDER_SITE_OTHER): Payer: No Typology Code available for payment source | Admitting: Licensed Clinical Social Worker

## 2020-06-02 ENCOUNTER — Other Ambulatory Visit: Payer: Self-pay

## 2020-06-02 DIAGNOSIS — F411 Generalized anxiety disorder: Secondary | ICD-10-CM | POA: Diagnosis not present

## 2020-06-02 NOTE — Progress Notes (Signed)
Virtual Visit via Video Note  I connected with Cindy Jones  On 06/02/20 at 1:00pm EST by a video enabled telemedicine application and verified that I am speaking with the correct person using two identifiers.   I discussed the limitations of evaluation and management by telemedicine and the availability of in person appointments. The patient expressed understanding and agreed to proceed.  LOCATION: Patient:Home Provider: Home Office  History of Present Illness: Pt was referred to therapy by her psychiatrist at [facility] for anxiety and panic attacks.   Participation Level: Active   Type of Therapy: Virtual Video individual therapy  Treatment Goals addressed: Improve psychiatric symptoms, Controlled Behavior, Moderated Mood, Improve Unhelpful Thought Patterns, Emotional Regulation Skills (Moderate moods, anger management, stress management), Feel and express a full Range of Emotions, Learn about Diagnosis, Healthy Coping Skills.  Interventions: CBT/Supportive/Psychoeducation  Summary: Cindy Jones is a 30 y.o. female who presents anxious for her virtual video individual counseling session. Patient discussed her psychiatric symptoms and current life events. Cln provided psychoeducation on effective coping skills for current stressors. Pt reports on her current eating plan that she has joined, where concerned friends have expressed their concern of how it may affect her eating disorder. Pt became defensive in her retort. .Clinician utilized MI OARS to reflect and summarize her  thoughts and feelings about her choice in a healthy eating plan and her thoughts and feelings about her friends concern. Clinician utilized CBT to address  Her thought processes, where she was able to see how her friends had best intentions.          Assessment and plan: Counselor will continue to meet with patient to address treatment plan goals. Patient will continue to follow recommendations of providers and  implement skills learned in session.  Suicidal/Homicidal: Nowithout intent/plan  Therapist Response: Assessed pt's current functioning and reviewed progress.. Assisted pt processing stressors, negative thoughts.  Assisted pt processing for the management of her stressors.  Participation Level: Active  Diagnosis: Axis I.:  GAD    Follow Up Instructions: I discussed the assessment and treatment plan with the patient. The patient was provided an opportunity to ask questions and all were answered. The patient agreed with the plan and demonstrated an understanding of the instructions.   The patient was advised to call back or seek an in-person evaluation if the symptoms worsen or if the condition fails to improve as anticipated.  I provided 60 minutes of non-face-to-face time during this encounter.   Terrin Meddaugh S, LCAS

## 2020-06-07 ENCOUNTER — Other Ambulatory Visit: Payer: Self-pay

## 2020-06-07 ENCOUNTER — Encounter: Payer: Self-pay | Admitting: Family Medicine

## 2020-06-07 ENCOUNTER — Ambulatory Visit (INDEPENDENT_AMBULATORY_CARE_PROVIDER_SITE_OTHER): Payer: No Typology Code available for payment source | Admitting: Family Medicine

## 2020-06-07 VITALS — BP 134/85 | HR 104 | Temp 98.7°F | Ht 66.0 in | Wt 177.0 lb

## 2020-06-07 DIAGNOSIS — I951 Orthostatic hypotension: Secondary | ICD-10-CM

## 2020-06-07 DIAGNOSIS — R55 Syncope and collapse: Secondary | ICD-10-CM

## 2020-06-07 DIAGNOSIS — R251 Tremor, unspecified: Secondary | ICD-10-CM

## 2020-06-07 LAB — COMPREHENSIVE METABOLIC PANEL
ALT: 15 U/L (ref 0–35)
AST: 14 U/L (ref 0–37)
Albumin: 4.6 g/dL (ref 3.5–5.2)
Alkaline Phosphatase: 37 U/L — ABNORMAL LOW (ref 39–117)
BUN: 9 mg/dL (ref 6–23)
CO2: 25 mEq/L (ref 19–32)
Calcium: 9.3 mg/dL (ref 8.4–10.5)
Chloride: 104 mEq/L (ref 96–112)
Creatinine, Ser: 0.75 mg/dL (ref 0.40–1.20)
GFR: 107.49 mL/min (ref >60.00)
Glucose, Bld: 81 mg/dL (ref 70–99)
Potassium: 4 mEq/L (ref 3.5–5.1)
Sodium: 136 mEq/L (ref 135–145)
Total Bilirubin: 0.3 mg/dL (ref 0.2–1.2)
Total Protein: 7.1 g/dL (ref 6.0–8.3)

## 2020-06-07 LAB — CBC WITH DIFFERENTIAL/PLATELET
Basophils Absolute: 0.1 10*3/uL (ref 0.0–0.1)
Basophils Relative: 1.8 % (ref 0.0–3.0)
Eosinophils Absolute: 0.1 10*3/uL (ref 0.0–0.7)
Eosinophils Relative: 2.5 % (ref 0.0–5.0)
HCT: 39.1 % (ref 36.0–46.0)
Hemoglobin: 13.3 g/dL (ref 12.0–15.0)
Lymphocytes Relative: 29.6 % (ref 12.0–46.0)
Lymphs Abs: 1.2 10*3/uL (ref 0.7–4.0)
MCHC: 34.1 g/dL (ref 30.0–36.0)
MCV: 89.3 fl (ref 78.0–100.0)
Monocytes Absolute: 0.3 10*3/uL (ref 0.1–1.0)
Monocytes Relative: 8.4 % (ref 3.0–12.0)
Neutro Abs: 2.3 10*3/uL (ref 1.4–7.7)
Neutrophils Relative %: 57.7 % (ref 43.0–77.0)
Platelets: 163 10*3/uL (ref 150.0–400.0)
RBC: 4.39 Mil/uL (ref 3.87–5.11)
RDW: 13.4 % (ref 11.5–15.5)
WBC: 3.9 10*3/uL — ABNORMAL LOW (ref 4.0–10.5)

## 2020-06-07 LAB — HEMOGLOBIN A1C: Hgb A1c MFr Bld: 5.2 % (ref 4.6–6.5)

## 2020-06-07 LAB — TSH: TSH: 1.66 u[IU]/mL (ref 0.35–4.50)

## 2020-06-07 NOTE — Patient Instructions (Signed)
Postural Orthostatic Tachycardia Syndrome °Postural orthostatic tachycardia syndrome (POTS) is a group of symptoms that occur when a person stands up after lying down. POTS occurs when less blood than normal flows to the body when you stand up. The reduced blood flow to the body makes the heart beat rapidly. °POTS may be associated with another medical condition, or it may occur on its own. °What are the causes? °The cause of this condition is not known, but many conditions and diseases are associated with it. °What increases the risk? °This condition is more likely to develop in: °· Women 15-50 years old. °· Women who are pregnant. °· Women who are in their period (menstruating). °· People who have certain conditions, such as: °? Infection from a virus. °? Attacks of healthy organs by the body's immunity (autoimmune disease). °? Losing a lot of red blood cells (anemia). °? Losing too much water in the body (dehydration). °? An overactive thyroid (hyperthyroidism). °· People who take certain medicines. °· People who have had a major injury. °· People who have had surgery. °What are the signs or symptoms? °The most common symptom of this condition is light-headedness when one stands from a lying or sitting position. Other symptoms may include: °· Feeling a rapid increase in the heartbeat (tachycardia) within 10 minutes of standing up. °· Fainting. °· Weakness. °· Confusion. °· Trembling. °· Shortness of breath. °· Sweating or flushing. °· Headache. °· Chest pain. °· Breathing that is deeper and faster than normal (hyperventilation). °· Nausea. °· Anxiety. °Symptoms may be worse in the morning, and they may be relieved by lying down. °How is this diagnosed? °This condition is diagnosed based on: °· Your symptoms. °· Your medical history. °· A physical exam. °· Checking your heart rate when you are lying down and after you stand up. °· Checking your blood pressure when you go from lying down to standing up. °· Blood  tests to measure hormones that change with blood pressure. The blood tests will be done when you are lying down and when you are standing up. °You may have other tests to check for conditions or diseases that are associated with POTS. °How is this treated? °Treatment for this condition depends on how severe your symptoms are and whether you have any conditions or diseases that are associated with POTS. Treatment may involve: °· Treating any conditions or diseases that are associated with POTS. °· Drinking two glasses of water before getting up from a lying position. °· Eating more salt (sodium). °· Taking medicine to control blood pressure and heart rate (beta-blocker). °· Avoiding certain medicines. °· Starting an exercise program under the supervision of a health care provider. °Follow these instructions at home: °Medicines °· Take over-the-counter and prescription medicines only as told by your health care provider. °· Let your health care provider know about all prescription or over-the-counter medicines. These include herbs, vitamins, and supplements. You may need to stop or adjust some medicines if they cause this condition. °· Talk with your health care provider before starting any new medicines. °Eating and drinking °· Drink enough fluid to keep your urine pale yellow. °· If told by your health care provider, drink two glasses of water before getting up from a lying position. °· Follow instructions from your health care provider about how much sodium you should eat. °· Avoid heavy meals. Eat several small meals a day instead of a few large meals.   °General instructions °· Do an aerobic exercise for 20   minutes a day, at least 3 days a week. °· Ask your health care provider what kinds of exercise are safe for you. °· Do not use any products that contain nicotine or tobacco, such as cigarettes and e-cigarettes. These can interfere with blood flow. If you need help quitting, ask your health care  provider. °· Keep all follow-up visits as told by your health care provider. This is important. °Contact a health care provider if: °· Your symptoms do not improve after treatment. °· Your symptoms get worse. °· You develop new symptoms. °Get help right away if: °· You have chest pain. °· You have difficulty breathing. °· You have fainting episodes. °These symptoms may represent a serious problem that is an emergency. Do not wait to see if the symptoms will go away. Get medical help right away. Call your local emergency services (911 in the [place] Do not drive yourself to the hospital. °Summary °· POTS is a condition that can cause light-headedness, fainting, and palpitations when you go from a sitting or lying position to a standing position. It occurs when less blood than normal flows to the body when you stand up. °· Treatment for this condition includes treating any underlying conditions, drinking plenty of water, stopping or changing some medicines, or starting an exercise program. °· Get help right away if you have chest pain, difficulty breathing, or fainting episodes. These may represent a serious problem that is an emergency. °This information is not intended to replace advice given to you by your health care provider. Make sure you discuss any questions you have with your health care provider. °Document Revised: 05/29/2017 Document Reviewed: 05/29/2017 °Elsevier Patient Education © 2021 [employer] ° °

## 2020-06-07 NOTE — Progress Notes (Signed)
This visit occurred during the SARS-CoV-2 public health emergency.  Safety protocols were in place, including screening questions prior to the visit, additional usage of staff PPE, and extensive cleaning of exam room while observing appropriate contact time as indicated for disinfecting solutions.    Cindy Jones , [birth year], 30 y.o., female MRN: [id number] Patient Care Team    Relationship Specialty Notifications Start End  Ma Hillock, DO PCP - General Family Medicine  06/12/19   Michael Boston, MD Consulting Physician General Surgery  01/31/16   Armbruster, Carlota Raspberry, MD Consulting Physician Gastroenterology  01/31/16   Donnamae Jude, MD Consulting Physician Obstetrics and Gynecology  10/27/19     Chief Complaint  Patient presents with  . Loss of Consciousness    Pt states that she passed out after standing on two different occasions;  pt states she felt dizzy, weakness, tingling; witness states she was out less than two mins      Subjective: Pt presents for an OV to discuss syncopal episodes.  She is present with her significant other today.  She reports she has had dizziness and shakiness for greater than 5 years when standing up.  In December she reports she stood up from a seated position and passed out.  This was witnessed by her significant other.  He states she was only out for about 30 seconds.  There was no seizure-like activity or bladder or bowel incontinence.  Patient reports another episode occurred mid January this was unwitnessed, and she feels she was only passed out for maybe a minute or 2.  She passed out again last Tuesday.  She states all of the events happen when she stands up quick and then starts walking.  She almost always feels dizzy, and feels like the blood drains out of her face, her heart rate starts pounding, her arms become heavy and then her legs start to tingle and she would pass out.  None of the events have seizure-like activity or bladder or bowel  incontinence.  She reports she drinks at least 80 ounces of water a day.  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 She has a significant h/o migraines, GAD, Depression and ADD. She is has been prescirbed adderall for many years alternating doses of adderall 25 Xl and adderall 20 mg depending upon work schedule.  Of note, she was seen in the  ED  05/11/2020 for a headache. CT head was normal. Bp was elevated mildly in the ED. Pregnancy test was negative. She states these were separate issues from her current symptoms.   Depression screen [facility] 2/9 06/07/2020 10/27/2019 06/03/2018 11/05/2017 02/14/2017  Decreased Interest 0 0 0 0 0  Down, Depressed, Hopeless 0 0 0 0 1  PHQ - 2 Score 0 0 0 0 1  Altered sleeping - - 1 0 0  Tired, decreased energy - - _0 Change in appetite - - 0 0 0  Feeling bad or failure about yourself  - - 1 1 0  Trouble concentrating - - 1 0 1  Moving slowly or fidgety/restless - - 0 0 0  Suicidal thoughts - - 0 0 0  PHQ-9 Score - - _1 Difficult doing work/chores - - Not difficult at all - -  Some recent data might be hidden    Allergies  Allergen Reactions  . Latex Swelling and Rash  . Cranberry Juice Powder Other (See Comments)  Throat pain.  . Naproxen Other (See Comments)    "Liver starts hurting" Diarrhea  . Nickel     Skin peeling  . Sulfa Antibiotics Hives and Swelling   Social History   Social History Narrative   - Married, no children.   - Financial risk analyst.   - She works FT as a Physiological scientist.   - Her aunt & uncle were her guardians when she was in [place]. They live in [place], [place].    - She has a fraternal twin sister.    - Wears her seatbelt, smoke detectors in the home.   Past Medical History:  Diagnosis Date  . Anxiety   . Asthma    as a child  . Binge eating disorder   . Dysrhythmia    " I feel it about once a month"  . Frequent headaches   . GAD (generalized anxiety disorder)   . GERD (gastroesophageal reflux disease)    . Major depression in complete remission ([place])   . Migraines   . PID (acute pelvic inflammatory disease)   . Seasonal allergies   . Tourette disorder   . Urinary tract bacterial infections    Past Surgical History:  Procedure Laterality Date  . CHOLECYSTECTOMY    . ESOPHAGOGASTRODUODENOSCOPY    . LAPAROSCOPIC CHOLECYSTECTOMY SINGLE SITE WITH INTRAOPERATIVE CHOLANGIOGRAM N/A 03/15/2016   Procedure: LAPAROSCOPIC CHOLECYSTECTOMY SINGLE SITE WITH INTRAOPERATIVE CHOLANGIOGRAM;  Surgeon: Michael Boston, MD;  Location: [facility];  Service: General;  Laterality: N/A;  . TONSILLECTOMY AND ADENOIDECTOMY  2013  . WISDOM TOOTH EXTRACTION     Family History  Problem Relation Age of Onset  . Alcohol abuse Mother   . Bipolar disorder Mother   . Cancer Father        sarcoma; passed away when pt was 8  . Tourette syndrome Maternal Aunt   . Alcohol abuse Maternal Aunt   . Hyperlipidemia Paternal Aunt   . Tourette syndrome Maternal Grandfather   . Heart disease Paternal Grandfather   . Alcohol abuse Maternal Uncle   . Suicidality Neg Hx    Allergies as of 06/07/2020      Reactions   Latex Swelling, Rash   Cranberry Juice Powder Other (See Comments)   Throat pain.   Naproxen Other (See Comments)   "Liver starts hurting" Diarrhea   Nickel    Skin peeling   Sulfa Antibiotics Hives, Swelling      Medication List       Accurate as of June 07, 2020  9:27 AM. If you have any questions, ask your nurse or doctor.        STOP taking these medications   MELATONIN ER PO Stopped by: Howard Pouch, DO     TAKE these medications   albuterol 108 (90 Base) MCG/ACT inhaler Commonly known as: VENTOLIN HFA 1-2 puffs 15 minutes prior to exercise.   amphetamine-dextroamphetamine 25 MG 24 hr capsule Commonly known as: ADDERALL XR Take 1 capsule by mouth daily.   amphetamine-dextroamphetamine 25 MG 24 hr capsule Commonly known as: Adderall XR Take 1 capsule by mouth every morning.    amphetamine-dextroamphetamine 25 MG 24 hr capsule Commonly known as: Adderall XR Take 1 capsule by mouth every morning.   amphetamine-dextroamphetamine 20 MG tablet Commonly known as: Adderall Take 1 tablet (20 mg total) by mouth daily as needed.   amphetamine-dextroamphetamine 20 MG tablet Commonly known as: Adderall Take 1 tablet (20 mg total) by mouth daily as needed.   amphetamine-dextroamphetamine 20  MG tablet Commonly known as: Adderall Take 1 tablet (20 mg total) by mouth daily as needed.   aspirin-acetaminophen-caffeine 250-250-65 MG tablet Commonly known as: EXCEDRIN MIGRAINE Take 1-2 tablets by mouth every 6 (six) hours as needed for headache.       All past medical history, surgical history, allergies, family history, immunizations andmedications were updated in the EMR today and reviewed under the history and medication portions of their EMR.     ROS: Negative, with the exception of above mentioned in HPI   Objective:  BP 134/85   Pulse (!) 104   Temp 98.7 F (37.1 C) (Oral)   Ht _0  (1.676 m)   SpO2 100%   BMI 27.44 kg/m  Body mass index is 27.44 kg/m.  Orthostatic positive today Gen: Afebrile. No acute distress. Nontoxic in appearance, well developed, well nourished.  Pleasant female. HENT: AT. [place].  Moist mucous membranes.  No cough or hoarseness. Eyes:Pupils Equal Round Reactive to light, Extraocular movements intact,  Conjunctiva without redness, discharge or icterus. Neck/lymp/endocrine: Supple, no lymphadenopathy, no thyromegaly CV: Mildly tachycardic, no murmurs.  No edema Chest: CTAB, no wheeze or crackles. Good air movement, normal resp effort.  Neuro:  Normal gait. PERLA. EOMi. Alert. Oriented x3  Psych: Normal affect, dress and demeanor. Normal speech. Normal thought content and judgment.  No exam data present No results found. No results found for this or any previous visit (from the past 24 hour(s)).  Assessment/Plan: ANTHONIA MONGER  is a 29 y.o. female present for OV for  Syncope, unspecified syncope type/shakiness/orthostatic syncope Orthostatic syncope, suspect possible POTS syndrome.  We briefly discussed this diagnosis today.  Will obtain lab to rule out thyroid, anemia, iron deficiency electrolyte disturbances or diabetes. If labs do not indicate cause will refer to cardiology for further evaluation.  We did discuss her limiting caffeine use. - Hemoglobin A1c - Comp Met (CMET) - TSH - CBC w/Diff - Iron, TIBC and Ferritin Panel Follow-up dependent upon lab results    Reviewed expectations re: course of current medical issues.  Discussed self-management of symptoms.  Outlined signs and symptoms indicating need for more acute intervention.  Patient verbalized understanding and all questions were answered.  Patient received an After-Visit Summary.    Orders Placed This Encounter  Procedures  . Hemoglobin A1c  . Comp Met (CMET)  . TSH  . CBC w/Diff  . Iron, TIBC and Ferritin Panel   No orders of the defined types were placed in this encounter.  Referral Orders  No referral(s) requested today     Note is dictated utilizing voice recognition software. Although note has been proof read prior to signing, occasional typographical errors still can be missed. If any questions arise, please do not hesitate to call for verification.   electronically signed by:  Howard Pouch, DO  [place]

## 2020-06-08 ENCOUNTER — Telehealth: Payer: Self-pay | Admitting: Family Medicine

## 2020-06-08 DIAGNOSIS — I951 Orthostatic hypotension: Secondary | ICD-10-CM

## 2020-06-08 DIAGNOSIS — R55 Syncope and collapse: Secondary | ICD-10-CM

## 2020-06-08 DIAGNOSIS — R251 Tremor, unspecified: Secondary | ICD-10-CM

## 2020-06-08 LAB — IRON,TIBC AND FERRITIN PANEL
%SAT: 18 % (calc) (ref 16–45)
Ferritin: 30 ng/mL (ref 16–154)
Iron: 46 ug/dL (ref 40–190)
TIBC: 255 mcg/dL (calc) (ref 250–450)

## 2020-06-08 NOTE — Telephone Encounter (Signed)
Spoke with patient and advised of results/recommendations. 

## 2020-06-08 NOTE — Telephone Encounter (Signed)
Please inform patient her iron panel is also normal. I have referred her to cardiology for further evaluation.  They will call her to get her scheduled.

## 2020-06-09 ENCOUNTER — Other Ambulatory Visit: Payer: Self-pay

## 2020-06-09 ENCOUNTER — Ambulatory Visit (INDEPENDENT_AMBULATORY_CARE_PROVIDER_SITE_OTHER): Payer: No Typology Code available for payment source | Admitting: Licensed Clinical Social Worker

## 2020-06-09 ENCOUNTER — Encounter (HOSPITAL_COMMUNITY): Payer: Self-pay | Admitting: Licensed Clinical Social Worker

## 2020-06-09 DIAGNOSIS — F411 Generalized anxiety disorder: Secondary | ICD-10-CM | POA: Diagnosis not present

## 2020-06-09 NOTE — Progress Notes (Signed)
Virtual Visit via Video Note  I connected with Cindy Jones  On 06/09/20 at 1:00pm EST by a video enabled telemedicine application and verified that I am speaking with the correct person using two identifiers.   I discussed the limitations of evaluation and management by telemedicine and the availability of in person appointments. The patient expressed understanding and agreed to proceed.  LOCATION: Patient:Home Provider: Home Office  History of Present Illness: Pt was referred to therapy by her psychiatrist at [facility] for anxiety and panic attacks.   Participation Level: Active   Type of Therapy: Virtual Video individual therapy  Treatment Goals addressed: Improve psychiatric symptoms, Controlled Behavior, Moderated Mood, Improve Unhelpful Thought Patterns, Emotional Regulation Skills (Moderate moods, anger management, stress management), Feel and express a full Range of Emotions, Learn about Diagnosis, Healthy Coping Skills.  Interventions: CBT/Supportive/Psychoeducation  Summary: Cindy Jones is a 30 y.o. female who presents anxious for her virtual video individual counseling session. Patient discussed her psychiatric symptoms and current life events. Pt reports she has been passing out (3X) within last 90 days, and saw her PCP who dx with syncope but is referring her to a cardiologist to r/o POTS and other heart issues. Pt tearfully shared her feelings about the unknown: fear, increased anxiety. Pt rates her anxiety today as 5/10, with 10 being the worse anxiety. Pt reports her anxiety has increased some to the point that she begins shaking.  Clinician utilized MI OARS to reflect and summarize her  thoughts and feelings about her health issues. Cln provided psychoeducation on grounding techniques. Pt practiced in session.           Assessment and plan: Counselor will continue to meet with patient to address treatment plan goals. Patient will continue to follow recommendations of  providers and implement skills learned in session.  Suicidal/Homicidal: Nowithout intent/plan  Therapist Response: Assessed [name] current functioning and reviewed progress.. Assisted pt processing stressors, negative thoughts.  Assisted pt processing for the management of her stressors.  Participation Level: Active  Diagnosis: Axis I.:  GAD    Follow Up Instructions: I discussed the assessment and treatment plan with the patient. The patient was provided an opportunity to ask questions and all were answered. The patient agreed with the plan and demonstrated an understanding of the instructions.   The patient was advised to call back or seek an in-person evaluation if the symptoms worsen or if the condition fails to improve as anticipated.  I provided 60 minutes of non-face-to-face time during this encounter.   Cindy Jones, LCAS

## 2020-06-16 ENCOUNTER — Encounter (HOSPITAL_COMMUNITY): Payer: Self-pay | Admitting: Licensed Clinical Social Worker

## 2020-06-16 ENCOUNTER — Other Ambulatory Visit: Payer: Self-pay

## 2020-06-16 ENCOUNTER — Ambulatory Visit (INDEPENDENT_AMBULATORY_CARE_PROVIDER_SITE_OTHER): Payer: No Typology Code available for payment source | Admitting: Licensed Clinical Social Worker

## 2020-06-16 DIAGNOSIS — F411 Generalized anxiety disorder: Secondary | ICD-10-CM | POA: Diagnosis not present

## 2020-06-16 NOTE — Progress Notes (Signed)
Virtual Visit via Video Note  I connected with Cindy Jones  On 06/16/20 at 1:00pm EST by a video enabled telemedicine application and verified that I am speaking with the correct person using two identifiers.   I discussed the limitations of evaluation and management by telemedicine and the availability of in person appointments. The patient expressed understanding and agreed to proceed.  LOCATION: Patient:Home Provider: Home Office  History of Present Illness: Pt was referred to therapy by her psychiatrist at [facility] for anxiety and panic attacks.   Participation Level: Active   Type of Therapy: Virtual Video individual therapy  Treatment Goals addressed: Improve psychiatric symptoms, Controlled Behavior, Moderated Mood, Improve Unhelpful Thought Patterns, Emotional Regulation Skills (Moderate moods, anger management, stress management), Feel and express a full Range of Emotions, Learn about Diagnosis, Healthy Coping Skills.  Interventions: CBT/Supportive/Psychoeducation  Summary: Cindy Jones is a 30 y.o. female who presents anxious for her virtual video individual counseling session. Patient discussed her psychiatric symptoms and current life events. Pt reports she has an appointment with a cardiologist to address her bouts of passing out (3X) within last 90 days. Pt tearfully shared her feelings about the unknown: fear, increased anxiety. Pt rates her anxiety today as 5/10, with 10 being the worse anxiety. Pt reports her has come to the realization that she may have a medical issue but trusts her providers. Reminded pat of her appt with her psychiatirst tomorrow. Suggested pt track her spells within the last 2 weeks to show to the cardiologist. Again, ln provided psychoeducation on grounding techniques.            Assessment and plan: Counselor will continue to meet with patient to address treatment plan goals. Patient will continue to follow recommendations of providers and  implement skills learned in session.  Suicidal/Homicidal: Nowithout intent/plan  Therapist Response: Assessed pt's current functioning and reviewed progress.. Assisted pt processing stressors, negative thoughts.  Assisted pt processing for the management of her stressors.  Participation Level: Active  Diagnosis: Axis I.:  GAD    Follow Up Instructions: I discussed the assessment and treatment plan with the patient. The patient was provided an opportunity to ask questions and all were answered. The patient agreed with the plan and demonstrated an understanding of the instructions.   The patient was advised to call back or seek an in-person evaluation if the symptoms worsen or if the condition fails to improve as anticipated.  I provided 30 minutes of non-face-to-face time during this encounter.   [name],[name], LCAS

## 2020-06-17 ENCOUNTER — Other Ambulatory Visit: Payer: Self-pay

## 2020-06-17 ENCOUNTER — Telehealth (HOSPITAL_COMMUNITY): Payer: Self-pay | Admitting: Psychiatry

## 2020-06-17 ENCOUNTER — Telehealth (HOSPITAL_COMMUNITY): Payer: No Typology Code available for payment source | Admitting: Psychiatry

## 2020-06-17 NOTE — Telephone Encounter (Signed)
I called the patient at our scheduled appointment time. There was no answer. I left a voice message for patient to call the clinic back at their convinence.   

## 2020-06-23 ENCOUNTER — Ambulatory Visit (INDEPENDENT_AMBULATORY_CARE_PROVIDER_SITE_OTHER): Payer: No Typology Code available for payment source | Admitting: Licensed Clinical Social Worker

## 2020-06-23 ENCOUNTER — Encounter (HOSPITAL_COMMUNITY): Payer: Self-pay | Admitting: Licensed Clinical Social Worker

## 2020-06-23 ENCOUNTER — Other Ambulatory Visit: Payer: Self-pay

## 2020-06-23 DIAGNOSIS — F411 Generalized anxiety disorder: Secondary | ICD-10-CM

## 2020-06-23 NOTE — Progress Notes (Signed)
Cardiology Office Note:   Date:  06/24/2020  NAME:  Cindy Jones    MRN: [id number] DOB:  March 15, 1991   PCP:  Natalia Leatherwood, DO  Cardiologist:  No primary care provider on file.   Referring MD: Natalia Leatherwood, DO   Chief Complaint  Patient presents with  . New Patient (Initial Visit)  . Chest Pain  . Loss of Consciousness   History of Present Illness:   Cindy Jones is a 30 y.o. female with a hx of anxiety, depression who is being seen today for the evaluation of syncope at the request of Kuneff, Renee A, DO.  She reports she is had dizziness for years.  Up to 3 to 4 years.  When she stands up in the morning she can get dizzy and lightheaded.  Apparently in December she has had 3 passing out episodes.  She reports she when she wakes up in the morning she gets blurry vision.  She gets lightheaded and dizzy.  She is woken up on the floor.  She reports no chest pain or shortness of breath prior to the episodes.  No urination or defecation is reported.  No seizure-like activity is reported.  Recent TSH was normal.  She is never had a heart attack or stroke.  She does have ADHD as well as Tourette's syndrome.  She takes Adderall.  No other change in medications.  She was drinking a lot of caffeine but she is reduce the doses.  She reports she can feel her heart racing when the episodes do happen.  They do bother her.  Apparently she has them in the morning.  As the day goes on if she drinks more water she has less symptoms.  She reports that she does a lot of high interval training.  This includes spin classes as well as walking.  She can walk 2 miles without any chest pain or shortness of breath.  She reports family history of heart disease in her paternal grandfather.  She does not smoke.  She drinks alcohol moderation.  No drug use is reported.  She reports she is not pregnant.  She reports she recently had her period.  No real medical problems other than Adderall and stress syndrome.   Everything else seems to be okay.  No significant stress in her life.  She does suffer from anxiety but no major issues reported.  Recent iron level shows her iron profile is borderline for low.  She will follow-up with her primary care about this.  She can also feel a fluttering in her chest occasionally that occur independently of the symptoms.  Past Medical History: Past Medical History:  Diagnosis Date  . Anxiety   . Asthma    as a child  . Binge eating disorder   . Dysrhythmia    " I feel it about once a month"  . Frequent headaches   . GAD (generalized anxiety disorder)   . GERD (gastroesophageal reflux disease)   . Major depression in complete remission (HCC)   . Migraines   . PID (acute pelvic inflammatory disease)   . Seasonal allergies   . Tourette disorder   . Urinary tract bacterial infections     Past Surgical History: Past Surgical History:  Procedure Laterality Date  . CHOLECYSTECTOMY    . ESOPHAGOGASTRODUODENOSCOPY    . LAPAROSCOPIC CHOLECYSTECTOMY SINGLE SITE WITH INTRAOPERATIVE CHOLANGIOGRAM N/A 03/15/2016   Procedure: LAPAROSCOPIC CHOLECYSTECTOMY SINGLE SITE WITH INTRAOPERATIVE CHOLANGIOGRAM;  Surgeon: Karie Soda,  MD;  Location: [facility] OR;  Service: General;  Laterality: N/A;  . TONSILLECTOMY AND ADENOIDECTOMY  2013  . WISDOM TOOTH EXTRACTION      Current Medications: Current Meds  Medication Sig  . albuterol (VENTOLIN HFA) 108 (90 Base) MCG/ACT inhaler 1-2 puffs 15 minutes prior to exercise.  [name] amphetamine-dextroamphetamine (ADDERALL XR) 25 MG 24 hr capsule Take 1 capsule by mouth daily.  [name] amphetamine-dextroamphetamine (ADDERALL) 20 MG tablet Take 1 tablet (20 mg total) by mouth daily as needed.  [name] aspirin-acetaminophen-caffeine (EXCEDRIN MIGRAINE) 250-250-65 MG per tablet Take 1-2 tablets by mouth every 6 (six) hours as needed for headache.   . [DISCONTINUED] amphetamine-dextroamphetamine (ADDERALL XR) 25 MG 24 hr capsule Take 1 capsule by mouth every  morning.  . [DISCONTINUED] amphetamine-dextroamphetamine (ADDERALL XR) 25 MG 24 hr capsule Take 1 capsule by mouth every morning.  . [DISCONTINUED] amphetamine-dextroamphetamine (ADDERALL) 20 MG tablet Take 1 tablet (20 mg total) by mouth daily as needed.  . [DISCONTINUED] amphetamine-dextroamphetamine (ADDERALL) 20 MG tablet Take 1 tablet (20 mg total) by mouth daily as needed.     Allergies:    Latex, Cranberry juice powder, Naproxen, Nickel, and Sulfa antibiotics   Social History: Social History   Socioeconomic History  . Marital status: Married    Spouse name: Not on file  . Number of children: 0  . Years of education: Not on file  . Highest education level: Not on file  Occupational History  . Occupation: Systems developer porfolio  Tobacco Use  . Smoking status: Never Smoker  . Smokeless tobacco: Never Used  Vaping Use  . Vaping Use: Never used  Substance and Sexual Activity  . Alcohol use: Yes    Alcohol/week: 1.0 standard drink    Types: 1 Glasses of wine per week    Comment: occ  . Drug use: No  . Sexual activity: Yes    Partners: Male    Birth control/protection: None, Condom    Comment: Married  Other Topics Concern  . Not on file  Social History Narrative   - Married, no children.   Psychiatrist education.   - She works FT as a Patent attorney.   - Her aunt & uncle were her guardians when she was in HS. They live in [place], [place].    - She has a fraternal twin sister.    - Wears her seatbelt, smoke detectors in the home.   Social Determinants of Health   Financial Resource Strain: Not on file  Food Insecurity: Not on file  Transportation Needs: Not on file  Physical Activity: Not on file  Stress: Not on file  Social Connections: Not on file     Family History: The patient's family history includes Alcohol abuse in her maternal aunt, maternal uncle, and mother; Bipolar disorder in her mother; Cancer in her father; Heart disease in her paternal grandfather;  Hyperlipidemia in her paternal aunt; Tourette syndrome in her maternal aunt and maternal grandfather. There is no history of Suicidality.  ROS:   All other ROS reviewed and negative. Pertinent positives noted in the HPI.     EKGs/Labs/Other Studies Reviewed:   The following studies were personally reviewed by me today:  EKG:  EKG is ordered today.  The ekg ordered today demonstrates normal sinus rhythm heart rate 80, no acute ischemic changes or evidence of infarction, and was personally reviewed by me.   Recent Labs: 06/07/2020: ALT 15; BUN 9; Creatinine, Ser 0.75; Hemoglobin 13.3; Platelets 163.0; Potassium 4.0; Sodium  136; TSH 1.66   Recent Lipid Panel    Component Value Date/Time   CHOL 125 10/27/2019 1357   TRIG 54 10/27/2019 1357   HDL 64 10/27/2019 1357   CHOLHDL 2.0 10/27/2019 1357   VLDL 10.4 06/03/2018 0847   LDLCALC 48 10/27/2019 1357   LDLDIRECT 53.0 07/14/2016 0833    Physical Exam:   VS:  BP 100/76 (BP Location: Right Arm, Patient Position: Sitting, Cuff Size: Normal)   Pulse 80   Ht 5\' 6"  (1.676 m)   Wt 178 lb (80.7 kg)   BMI 28.73 kg/m    Wt Readings from Last 3 Encounters:  06/24/20 178 lb (80.7 kg)  06/07/20 177 lb (80.3 kg)  05/10/20 170 lb (77.1 kg)    General: Well nourished, well developed, in no acute distress Head: Atraumatic, normal size  Eyes: PEERLA, EOMI  Neck: Supple, no JVD Endocrine: No thryomegaly Cardiac: Normal S1, S2; RRR; no murmurs, rubs, or gallops Lungs: Clear to auscultation bilaterally, no wheezing, rhonchi or rales  Abd: Soft, nontender, no hepatomegaly  Ext: No edema, pulses 2+ Musculoskeletal: No deformities, BUE and BLE strength normal and equal Skin: Warm and dry, no rashes   Neuro: Alert and oriented to person, place, time, and situation, CNII-XII grossly intact, no focal deficits  Psych: Normal mood and affect   ASSESSMENT:   Maryjean Kamily H Sasso is a 30 y.o. female who presents for the following: 1. Syncope and collapse    2. Vasovagal syncope     PLAN:   1. Syncope and collapse 2. Vasovagal syncope -Symptoms more consistent with vasovagal syncope.  Could be POTS.  Nonetheless they are treated the same.  Her EKG in the office shows normal sinus rhythm.  Her cardiovascular examination is normal.  I would like to proceed with a 7-day Zio patch to exclude arrhythmia.  I would like to check a B12 and a folate level.  Her TSH is normal.  She reports no significant stress.  Her iron level is borderline.  She is to follow-up with her primary care about this. -We will check an echocardiogram to ensure her heart structurally normal. -We recommended adequate hydration.  She should always have water with her.  I also recommend to increase her salt intake.  Even if she has POTS the treatment will be the same conservative measures first.  She should start to increase her water.  She should also drink things that are high in electrolytes and salt such as Gatorade and propel. -I recommended adequate sleep and exercise.  She is to be doing well with this.  I think it is very reassuring that her symptoms improve as the day goes on with adequate hydration.   Disposition: Return in about 2 months (around 08/22/2020).  Medication Adjustments/Labs and Tests Ordered: Current medicines are reviewed at length with the patient today.  Concerns regarding medicines are outlined above.  Orders Placed This Encounter  Procedures  . B12 and Folate Panel  . LONG TERM MONITOR (3-14 DAYS)  . EKG 12-Lead  . ECHOCARDIOGRAM COMPLETE   No orders of the defined types were placed in this encounter.   Patient Instructions  Medication Instructions:  The current medical regimen is effective;  continue present plan and medications.  *If you need a refill on your cardiac medications before your next appointment, please call your pharmacy*   Lab Work: B12, Folate   If you have labs (blood work) drawn today and your tests are completely normal,  you will  receive your results only by: [name] MyChart Message (if you have MyChart) OR . A paper copy in the mail If you have any lab test that is abnormal or we need to change your treatment, we will call you to review the results.   Testing/Procedures: Echocardiogram - Your physician has requested that you have an echocardiogram. Echocardiography is a painless test that uses sound waves to create images of your heart. It provides your doctor with information about the size and shape of your heart and how well your heart's chambers and valves are working. This procedure takes approximately one hour. There are no restrictions for this procedure. This will be performed at our [facility] location - [address].  ZIO XT- Long Term Monitor Instructions   Your physician has requested you wear your ZIO patch monitor____7___days.   This is a single patch monitor.  [employer] supplies one patch monitor per enrollment.  Additional stickers are not available.   Please do not apply patch if you will be having a Nuclear Stress Test, Echocardiogram, Cardiac CT, MRI, or Chest Xray during the time frame you would be wearing the monitor. The patch cannot be worn during these tests.  You cannot remove and re-apply the ZIO XT patch monitor.   Your ZIO patch monitor will be sent USPS Priority mail from [facility] directly to your home address. The monitor may also be mailed to a PO BOX if home delivery is not available.   It may take 3-5 days to receive your monitor after you have been enrolled.   Once you have received you monitor, please review enclosed instructions.  Your monitor has already been registered assigning a specific monitor serial # to you.   Applying the monitor   Shave hair from upper left chest.   Hold abrader disc by orange tab.  Rub abrader in 40 strokes over left upper chest as indicated in your monitor instructions.   Clean area with 4 enclosed alcohol pads .  Use all  pads to assure are is cleaned thoroughly.  Let dry.   Apply patch as indicated in monitor instructions.  Patch will be place under collarbone on left side of chest with arrow pointing upward.   Rub patch adhesive wings for 2 minutes.Remove white label marked "1".  Remove white label marked "2".  Rub patch adhesive wings for 2 additional minutes.   While looking in a mirror, press and release button in center of patch.  A small green light will flash 3-4 times .  This will be your only indicator the monitor has been turned on.     Do not shower for the first 24 hours.  You may shower after the first 24 hours.   Press button if you feel a symptom. You will hear a small click.  Record Date, Time and Symptom in the Patient Log Book.   When you are ready to remove patch, follow instructions on last 2 pages of Patient Log Book.  Stick patch monitor onto last page of Patient Log Book.   Place Patient Log Book in [place] box.  Use locking tab on box and tape box closed securely.  The [employer] and [employer] has [employer] on it.  Please place in mailbox as soon as possible.  Your physician should have your test results approximately 7 days after the monitor has been mailed back to [facility].   Call [facility] Customer Care at [phone] if you have questions  regarding your ZIO XT patch monitor.  Call them immediately if you see an orange light blinking on your monitor.   If your monitor falls off in less than 4 days contact our Monitor department at [phone].  If your monitor becomes loose or falls off after 4 days call [employer] at [phone] for suggestions on securing your monitor.     Follow-Up: At [facility], you and your health needs are our priority.  As part of our continuing mission to provide you with exceptional heart care, we have created designated Provider Care Teams.  These Care Teams include your primary Cardiologist (physician) and Advanced Practice Providers  (APPs -  Physician Assistants and Nurse Practitioners) who all work together to provide you with the care you need, when you need it.  We recommend signing up for the patient portal called "MyChart".  Sign up information is provided on this After Visit Summary.  MyChart is used to connect with patients for Virtual Visits (Telemedicine).  Patients are able to view lab/test results, encounter notes, upcoming appointments, etc.  Non-urgent messages can be sent to your provider as well.   To learn more about what you can do with MyChart, go to [email or web address].    Your next appointment:   2 month(s)  The format for your next appointment:   In Person  Provider:   Lennie Odor, MD        Signed, Lenna Gilford. Flora Lipps, MD [facility]  [address] [place], [place] [address] [phone]  06/24/2020 10:11 AM

## 2020-06-23 NOTE — Progress Notes (Signed)
Virtual Visit via Video Note  I connected with Cindy Jones  On 06/23/20 at 1:00pm EST by a video enabled telemedicine application and verified that I am speaking with the correct person using two identifiers.   I discussed the limitations of evaluation and management by telemedicine and the availability of in person appointments. The patient expressed understanding and agreed to proceed.  LOCATION: Patient:Home Provider: Home Office  History of Present Illness: Pt was referred to therapy by her psychiatrist at [facility] for anxiety and panic attacks.   Participation Level: Active   Type of Therapy: Virtual Video individual therapy  Treatment Goals addressed: Improve psychiatric symptoms, Controlled Behavior, Moderated Mood, Improve Unhelpful Thought Patterns, Emotional Regulation Skills (Moderate moods, anger management, stress management), Feel and express a full Range of Emotions, Learn about Diagnosis, Healthy Coping Skills.  Interventions: CBT/Supportive/Psychoeducation  Summary: Cindy Jones is a 30 y.o. female who presents anxious for her virtual video individual counseling session. Patient discussed her psychiatric symptoms and current life events. Pt reports her anxiety symptoms have increased due to relationship struggles (husband and best friend). Cln explored her communication skills within both relationships. Husband joined session where they both talked to each other about their communication errors. Cln pointed out their communication errors, provided psychoeducation and both patient and her husband role played using I statements and asking for an appointment.   Assessment and plan: Counselor will continue to meet with patient to address treatment plan goals. Patient will continue to follow recommendations of providers and implement skills learned in session.  Suicidal/Homicidal: Nowithout intent/plan  Therapist Response: Assessed pt's current functioning and reviewed  progress.. Assisted pt processing stressors, negative thoughts.  Assisted pt processing for the management of her stressors.  Participation Level: Active  Diagnosis: Axis I.:  GAD    Follow Up Instructions: I discussed the assessment and treatment plan with the patient. The patient was provided an opportunity to ask questions and all were answered. The patient agreed with the plan and demonstrated an understanding of the instructions.   The patient was advised to call back or seek an in-person evaluation if the symptoms worsen or if the condition fails to improve as anticipated.  I provided 60 minutes of non-face-to-face time during this encounter.   Molleigh Huot S, LCAS

## 2020-06-24 ENCOUNTER — Encounter: Payer: Self-pay | Admitting: Cardiovascular Disease

## 2020-06-24 ENCOUNTER — Other Ambulatory Visit (HOSPITAL_COMMUNITY): Payer: Self-pay | Admitting: Psychiatry

## 2020-06-24 ENCOUNTER — Ambulatory Visit: Payer: No Typology Code available for payment source | Admitting: Cardiovascular Disease

## 2020-06-24 ENCOUNTER — Ambulatory Visit (INDEPENDENT_AMBULATORY_CARE_PROVIDER_SITE_OTHER): Payer: No Typology Code available for payment source

## 2020-06-24 ENCOUNTER — Other Ambulatory Visit: Payer: Self-pay

## 2020-06-24 ENCOUNTER — Encounter: Payer: Self-pay | Admitting: Radiology

## 2020-06-24 ENCOUNTER — Telehealth (INDEPENDENT_AMBULATORY_CARE_PROVIDER_SITE_OTHER): Payer: No Typology Code available for payment source | Admitting: Psychiatry

## 2020-06-24 VITALS — BP 100/76 | HR 80 | Ht 66.0 in | Wt 178.0 lb

## 2020-06-24 DIAGNOSIS — F952 Tourette's disorder: Secondary | ICD-10-CM | POA: Diagnosis not present

## 2020-06-24 DIAGNOSIS — F410 Panic disorder [episodic paroxysmal anxiety] without agoraphobia: Secondary | ICD-10-CM

## 2020-06-24 DIAGNOSIS — R55 Syncope and collapse: Secondary | ICD-10-CM

## 2020-06-24 DIAGNOSIS — F502 Bulimia nervosa: Secondary | ICD-10-CM

## 2020-06-24 DIAGNOSIS — F411 Generalized anxiety disorder: Secondary | ICD-10-CM | POA: Diagnosis not present

## 2020-06-24 DIAGNOSIS — F988 Other specified behavioral and emotional disorders with onset usually occurring in childhood and adolescence: Secondary | ICD-10-CM | POA: Diagnosis not present

## 2020-06-24 MED ORDER — LISDEXAMFETAMINE DIMESYLATE 20 MG PO CAPS: 20.0000 mg | ORAL_CAPSULE | Freq: Every day | ORAL | 0 refills | Status: DC

## 2020-06-24 MED FILL — VYVANSE 20 MG CAPSULE: 20 | 30 days supply | Qty: 30 | Fill #0

## 2020-06-24 NOTE — Progress Notes (Signed)
Virtual Visit via Video Note  I connected with Cindy Jones on 06/24/20 at  2:30 PM EST by a video enabled telemedicine application and verified that I am speaking with the correct person using two identifiers.  Location: Patient: home Provider: office   I discussed the limitations of evaluation and management by telemedicine and the availability of in person appointments. The patient expressed understanding and agreed to proceed.  History of Present Illness: Cindy Jones is being evaluated for Potts syndrome due to 3 separate fainting episodes. Her PCP is doing some blood work. They suggested she switch to a long acting stimulant like Vyvanse. Lately she is more fatigued and having more migraines but depression is stable. Anxiety is mildly elevated due to school and health stressors. Her sleep is good and she no longer taking Melatonin. Her tourette's is linked to her anxiety but is manageable.  She denies any panic attacks. She is exercising regularly and is working with a nutrionist. It is helping to broaden her diet. Her weight is stable. She denies SI/HI.   Observations/Objective:  Psychiatric Specialty Exam: ROS  There were no vitals taken for this visit.There is no height or weight on file to calculate BMI.  General Appearance: Casual and Neat  Eye Contact:  Good  Speech:  Clear and Coherent and Normal Rate  Volume:  Normal  Mood:  Anxious  Affect:  Full Range  Thought Process:  Goal Directed, Linear and Descriptions of Associations: Intact  Orientation:  Full (Time, Place, and Person)  Thought Content:  Logical  Suicidal Thoughts:  No  Homicidal Thoughts:  No  Memory:  Immediate;   Good  Judgement:  Good  Insight:  Good  Psychomotor Activity:  Normal  Concentration:  Concentration: Good  Recall:  Good  Fund of Knowledge:  Good  Language:  Good  Akathisia:  No  Handed:  Right  AIMS (if indicated):     Assets:  Communication Skills Desire for Improvement Financial  Resources/Insurance Housing Intimacy Resilience Social Support Talents/Skills Transportation Vocational/Educational  ADL's:  Intact  Cognition:  WNL  Sleep:           Assessment and Plan: 1. ADD (attention deficit disorder) without hyperactivity - lisdexamfetamine (VYVANSE) 20 MG capsule; Take 1 capsule (20 mg total) by mouth daily.  Dispense: 30 capsule; Refill: 0 - lisdexamfetamine (VYVANSE) 20 MG capsule; Take 1 capsule (20 mg total) by mouth daily.  Dispense: 30 capsule; Refill: 0  2. GAD (generalized anxiety disorder)  3. Tourette disease  4. Bulimia nervosa  5. Panic attacks   d/c Adderall per PCP request due to fluctuating BP and possible Pott's syndrome   Follow Up Instructions: In 3-4 weeks or sooner if needed   I discussed the assessment and treatment plan with the patient. The patient was provided an opportunity to ask questions and all were answered. The patient agreed with the plan and demonstrated an understanding of the instructions.   The patient was advised to call back or seek an in-person evaluation if the symptoms worsen or if the condition fails to improve as anticipated.   Oletta Darter, MD

## 2020-06-24 NOTE — Patient Instructions (Signed)
Medication Instructions:  The current medical regimen is effective;  continue present plan and medications.  *If you need a refill on your cardiac medications before your next appointment, please call your pharmacy*   Lab Work: B12, Folate   If you have labs (blood work) drawn today and your tests are completely normal, you will receive your results only by: [name] MyChart Message (if you have MyChart) OR . A paper copy in the mail If you have any lab test that is abnormal or we need to change your treatment, we will call you to review the results.   Testing/Procedures: Echocardiogram - Your physician has requested that you have an echocardiogram. Echocardiography is a painless test that uses sound waves to create images of your heart. It provides your doctor with information about the size and shape of your heart and how well your heart's chambers and valves are working. This procedure takes approximately one hour. There are no restrictions for this procedure. This will be performed at our [facility] location - [address], [address].  ZIO XT- Long Term Monitor Instructions   Your physician has requested you wear your ZIO patch monitor____7___days.   This is a single patch monitor.  [employer] supplies one patch monitor per enrollment.  Additional stickers are not available.   Please do not apply patch if you will be having a Nuclear Stress Test, Echocardiogram, Cardiac CT, MRI, or Chest Xray during the time frame you would be wearing the monitor. The patch cannot be worn during these tests.  You cannot remove and re-apply the ZIO XT patch monitor.   Your ZIO patch monitor will be sent USPS Priority mail from [facility] directly to your home address. The monitor may also be mailed to a PO BOX if home delivery is not available.   It may take 3-5 days to receive your monitor after you have been enrolled.   Once you have received you monitor, please review enclosed instructions.  Your  monitor has already been registered assigning a specific monitor serial # to you.   Applying the monitor   Shave hair from upper left chest.   Hold abrader disc by orange tab.  Rub abrader in 40 strokes over left upper chest as indicated in your monitor instructions.   Clean area with 4 enclosed alcohol pads .  Use all pads to assure are is cleaned thoroughly.  Let dry.   Apply patch as indicated in monitor instructions.  Patch will be place under collarbone on left side of chest with arrow pointing upward.   Rub patch adhesive wings for 2 minutes.Remove white label marked "1".  Remove white label marked "2".  Rub patch adhesive wings for 2 additional minutes.   While looking in a mirror, press and release button in center of patch.  A small green light will flash 3-4 times .  This will be your only indicator the monitor has been turned on.     Do not shower for the first 24 hours.  You may shower after the first 24 hours.   Press button if you feel a symptom. You will hear a small click.  Record Date, Time and Symptom in the Patient Log Book.   When you are ready to remove patch, follow instructions on last 2 pages of Patient Log Book.  Stick patch monitor onto last page of Patient Log Book.   Place Patient Log Book in [place] box.  Use locking tab on box and  tape box closed securely.  The [employer] and [employer] has [employer] on it.  Please place in mailbox as soon as possible.  Your physician should have your test results approximately 7 days after the monitor has been mailed back to [facility].   Call [facility] Customer Care at [phone] if you have questions regarding your ZIO XT patch monitor.  Call them immediately if you see an orange light blinking on your monitor.   If your monitor falls off in less than 4 days contact our Monitor department at [phone].  If your monitor becomes loose or falls off after 4 days call [employer] at [phone] for suggestions on  securing your monitor.     Follow-Up: At [facility], you and your health needs are our priority.  As part of our continuing mission to provide you with exceptional heart care, we have created designated Provider Care Teams.  These Care Teams include your primary Cardiologist (physician) and Advanced Practice Providers (APPs -  Physician Assistants and Nurse Practitioners) who all work together to provide you with the care you need, when you need it.  We recommend signing up for the patient portal called "MyChart".  Sign up information is provided on this After Visit Summary.  MyChart is used to connect with patients for Virtual Visits (Telemedicine).  Patients are able to view lab/test results, encounter notes, upcoming appointments, etc.  Non-urgent messages can be sent to your provider as well.   To learn more about what you can do with MyChart, go to [email or web address].    Your next appointment:   2 month(s)  The format for your next appointment:   In Person  Provider:   Lennie Odor, MD

## 2020-06-24 NOTE — Progress Notes (Signed)
Enrolled patient for a 7 day Zio XT Monitor to be mailed to patients home.  

## 2020-06-25 ENCOUNTER — Encounter: Payer: Self-pay | Admitting: Family Medicine

## 2020-06-25 LAB — B12 AND FOLATE PANEL
Folate: 4.2 ng/mL (ref >3.0)
Vitamin B-12: 247 pg/mL (ref 232–1245)

## 2020-06-25 NOTE — Telephone Encounter (Signed)
She had labs collected by cardiologist with a low B12.  Note states follow-up with primary care to discuss. I suggest she make an appointment to discuss.

## 2020-06-25 NOTE — Telephone Encounter (Signed)
sure

## 2020-06-29 DIAGNOSIS — R55 Syncope and collapse: Secondary | ICD-10-CM

## 2020-06-30 ENCOUNTER — Encounter (HOSPITAL_COMMUNITY): Payer: Self-pay | Admitting: Licensed Clinical Social Worker

## 2020-06-30 ENCOUNTER — Other Ambulatory Visit: Payer: Self-pay

## 2020-06-30 ENCOUNTER — Ambulatory Visit (INDEPENDENT_AMBULATORY_CARE_PROVIDER_SITE_OTHER): Payer: No Typology Code available for payment source | Admitting: Licensed Clinical Social Worker

## 2020-06-30 DIAGNOSIS — F411 Generalized anxiety disorder: Secondary | ICD-10-CM | POA: Diagnosis not present

## 2020-06-30 NOTE — Progress Notes (Signed)
Virtual Visit via Video Note  I connected with Cindy Jones  On 06/30/20 at 1:00pm EST by a video enabled telemedicine application and verified that I am speaking with the correct person using two identifiers.   I discussed the limitations of evaluation and management by telemedicine and the availability of in person appointments. The patient expressed understanding and agreed to proceed.  LOCATION: Patient:Home Provider: Home Office  History of Present Illness: Pt was referred to therapy by her psychiatrist at [facility] for anxiety and panic attacks.   Participation Level: Active   Type of Therapy: Virtual Video individual therapy  Treatment Goals addressed: Improve psychiatric symptoms, Controlled Behavior, Moderated Mood, Improve Unhelpful Thought Patterns, Emotional Regulation Skills (Moderate moods, anger management, stress management), Feel and express a full Range of Emotions, Learn about Diagnosis, Healthy Coping Skills.  Interventions: CBT/Supportive/Psychoeducation  Summary: Cindy Jones is a 30 y.o. female who presents anxious for her virtual video individual counseling session. Patient discussed her psychiatric symptoms and current life events. Pt reports on her cardiology appointment to determine dx of her fainting spells. The dr will continue with testing to r/o POTS. Clinician utilized MI OARS to affirm her concerns about her health issues and how it's affecting all areas of her life. Clinician challenged her negative thoughts. Pt saw her psychiatrist, Dr. Michae Kava, who changed her ADHD meds to Vyvanse at the suggestion of cardiologist. Pt is having tourettes symptoms and don't feel the new meds are working like Adderall. Cln sent a note to the nurse to contact Dr. Michae Kava with information. Cln relayed this info to pt.       Assessment and plan: Counselor will continue to meet with patient to address treatment plan goals. Patient will continue to follow recommendations of  providers and implement skills learned in session.  Suicidal/Homicidal: Nowithout intent/plan  Therapist Response: Assessed pt's current functioning and reviewed progress.. Assisted pt processing stressors, negative thoughts.  Assisted pt processing for the management of her stressors.  Participation Level: Active  Diagnosis: Axis I.:  GAD    Follow Up Instructions: I discussed the assessment and treatment plan with the patient. The patient was provided an opportunity to ask questions and all were answered. The patient agreed with the plan and demonstrated an understanding of the instructions.   The patient was advised to call back or seek an in-person evaluation if the symptoms worsen or if the condition fails to improve as anticipated.  I provided 60 minutes of non-face-to-face time during this encounter.   Shanik Brookshire S, LCAS

## 2020-06-30 NOTE — Addendum Note (Signed)
Addended by: Vernona Rieger on: 06/30/2020 10:34 AM   Modules accepted: Level of Service

## 2020-07-01 ENCOUNTER — Encounter: Payer: Self-pay | Admitting: Family Medicine

## 2020-07-01 ENCOUNTER — Other Ambulatory Visit: Payer: Self-pay

## 2020-07-01 ENCOUNTER — Ambulatory Visit (INDEPENDENT_AMBULATORY_CARE_PROVIDER_SITE_OTHER): Payer: No Typology Code available for payment source | Admitting: Family Medicine

## 2020-07-01 ENCOUNTER — Other Ambulatory Visit (HOSPITAL_COMMUNITY): Payer: Self-pay | Admitting: Psychiatry

## 2020-07-01 VITALS — BP 112/71 | HR 83 | Temp 99.3°F | Ht 66.0 in | Wt 180.0 lb

## 2020-07-01 DIAGNOSIS — Z91018 Allergy to other foods: Secondary | ICD-10-CM | POA: Diagnosis not present

## 2020-07-01 DIAGNOSIS — R55 Syncope and collapse: Secondary | ICD-10-CM | POA: Diagnosis not present

## 2020-07-01 DIAGNOSIS — E538 Deficiency of other specified B group vitamins: Secondary | ICD-10-CM | POA: Diagnosis not present

## 2020-07-01 DIAGNOSIS — L509 Urticaria, unspecified: Secondary | ICD-10-CM

## 2020-07-01 DIAGNOSIS — F988 Other specified behavioral and emotional disorders with onset usually occurring in childhood and adolescence: Secondary | ICD-10-CM

## 2020-07-01 MED ORDER — LISDEXAMFETAMINE DIMESYLATE 30 MG PO CAPS
30.0000 mg | ORAL_CAPSULE | Freq: Every day | ORAL | 0 refills | Status: DC
Start: 2020-07-01 — End: 2020-07-22

## 2020-07-01 MED ORDER — CYANOCOBALAMIN 1000 MCG/ML IJ SOLN
1000.0000 ug | Freq: Once | INTRAMUSCULAR | Status: AC
  Administered 2020-07-01: 1000 ug via INTRAMUSCULAR

## 2020-07-01 NOTE — Progress Notes (Signed)
This visit occurred during the SARS-CoV-2 public health emergency.  Safety protocols were in place, including screening questions prior to the visit, additional usage of staff PPE, and extensive cleaning of exam room while observing appropriate contact time as indicated for disinfecting solutions.    Cindy Jones , 1990-12-27, 30 y.o., female MRN: [id number] Patient Care Team    Relationship Specialty Notifications Start End  Natalia Leatherwood, DO PCP - General Family Medicine  06/12/19   Karie Soda, MD Consulting Physician General Surgery  01/31/16   Armbruster, Willaim Rayas, MD Consulting Physician Gastroenterology  01/31/16   Reva Bores, MD Consulting Physician Obstetrics and Gynecology  10/27/19     Chief Complaint  Patient presents with  . B12 def    Pt was seen by cardiology;      Subjective: Pt presents for an OV on follow up on her abnormal lab completed at cardiology for her syncopal episodes.  Her b12 levels resulted in the low 200s. Folate was normal. She also mentions to day she has omitted all diary from her diet. She states she has been having a itchy rash a few times a week consistent with urticaria that resolves with benadryl use. She also endorses feeling more fatigued.  Depression screen [facility] 2/9 06/07/2020 10/27/2019 06/03/2018 11/05/2017 02/14/2017  Decreased Interest 0 0 0 0 0  Down, Depressed, Hopeless 0 0 0 0 1  PHQ - 2 Score 0 0 0 0 1  Altered sleeping - - 1 0 0  Tired, decreased energy - - 1 1 2   Change in appetite - - 0 0 0  Feeling bad or failure about yourself  - - 1 1 0  Trouble concentrating - - 1 0 1  Moving slowly or fidgety/restless - - 0 0 0  Suicidal thoughts - - 0 0 0  PHQ-9 Score - - 4 2 4   Difficult doing work/chores - - Not difficult at all - -  Some encounter information is confidential and restricted. Go to Review Flowsheets activity to see all data.  Some recent data might be hidden    Allergies  Allergen Reactions  . Latex Swelling and  Rash  . Cranberry Juice Powder Other (See Comments)    Throat pain.  . Naproxen Other (See Comments)    "Liver starts hurting" Diarrhea  . Nickel     Skin peeling  . Sulfa Antibiotics Hives and Swelling   Social History   Social History Narrative   - Married, no children.   - .   - She works FT as a .   - Her aunt & uncle were her guardians when she was in HS. 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    - She has a fraternal twin sister.    - Wears her seatbelt, smoke detectors in the home.   Past Medical History:  Diagnosis Date  . Anxiety   . Asthma    as a child  . Binge eating disorder   . Dysrhythmia    " I feel it about once a month"  . Frequent headaches   . GAD (generalized anxiety disorder)   . GERD (gastroesophageal reflux disease)   . Major depression in complete remission (HCC)   . Migraines   . PID (acute pelvic inflammatory disease)   . Seasonal allergies   . Tourette disorder   . Urinary tract bacterial infections    Past Surgical History:  Procedure Laterality Date  .  CHOLECYSTECTOMY    . ESOPHAGOGASTRODUODENOSCOPY    . LAPAROSCOPIC CHOLECYSTECTOMY SINGLE SITE WITH INTRAOPERATIVE CHOLANGIOGRAM N/A 03/15/2016   Procedure: LAPAROSCOPIC CHOLECYSTECTOMY SINGLE SITE WITH INTRAOPERATIVE CHOLANGIOGRAM;  Surgeon: Karie Soda, MD;  Location: [facility] OR;  Service: General;  Laterality: N/A;  . TONSILLECTOMY AND ADENOIDECTOMY  2013  . WISDOM TOOTH EXTRACTION     Family History  Problem Relation Age of Onset  . Alcohol abuse Mother   . Bipolar disorder Mother   . Cancer Father        sarcoma; passed away when pt was 8  . Tourette syndrome Maternal Aunt   . Alcohol abuse Maternal Aunt   . Hyperlipidemia Paternal Aunt   . Tourette syndrome Maternal Grandfather   . Heart disease Paternal Grandfather   . Alcohol abuse Maternal Uncle   . Suicidality Neg Hx    Allergies as of 07/01/2020      Reactions   Latex Swelling, Rash   Cranberry  Juice Powder Other (See Comments)   Throat pain.   Naproxen Other (See Comments)   "Liver starts hurting" Diarrhea   Nickel    Skin peeling   Sulfa Antibiotics Hives, Swelling      Medication List       Accurate as of July 01, 2020  9:33 AM. If you have any questions, ask your nurse or doctor.        albuterol 108 (90 Base) MCG/ACT inhaler Commonly known as: VENTOLIN HFA 1-2 puffs 15 minutes prior to exercise.   aspirin-acetaminophen-caffeine 250-250-65 MG tablet Commonly known as: EXCEDRIN MIGRAINE Take 1-2 tablets by mouth every 6 (six) hours as needed for headache.   lisdexamfetamine 20 MG capsule Commonly known as: Vyvanse Take 1 capsule (20 mg total) by mouth daily.   lisdexamfetamine 20 MG capsule Commonly known as: Vyvanse Take 1 capsule (20 mg total) by mouth daily.       All past medical history, surgical history, allergies, family history, immunizations andmedications were updated in the EMR today and reviewed under the history and medication portions of their EMR.     ROS: Negative, with the exception of above mentioned in HPI   Objective:  BP 112/71   Pulse 83   Temp 99.3 F (37.4 C) (Oral)   Ht 5\' 6"  (1.676 m)   Wt 180 lb (81.6 kg)   LMP 06/09/2020   SpO2 99%   BMI 29.05 kg/m  Body mass index is 29.05 kg/m. Gen: Afebrile. No acute distress. Nontoxic in appearance, well developed, well nourished.  HENT: AT. [place].  Eyes:Pupils Equal Round Reactive to light, Extraocular movements intact,  Conjunctiva without redness, discharge or icterus. Skin: no rashes, purpura or petechiae.  Neuro:  Normal gait. PERLA. EOMi. Alert. Oriented x3  Psych: Normal affect, dress and demeanor. Normal speech. Normal thought content and judgment.  No exam data present No results found. No results found for this or any previous visit (from the past 24 hour(s)).  Assessment/Plan: Cindy Jones is a 30 y.o. female present for OV for  B12 deficiency/Syncope,  unspecified syncope type/low energy - B12 is low and may be contributing to her syncope and fatigue.  - I have encouraged her to continue her follow recs by cardio  - b12 injection provided today (1:4) - she is to start subl B12 1000 mcg qd - also encouraged her to start vit d otc 800u daily- since she has removed all diary from her diet and has decreased energy - will retest at her next  appt for [facility] in June.   Urticaria/food allergy - new onset hives a few times a week.  - Ambulatory referral to Allergy    - b12 inject by nurse visit once weekly x4 (1:4 provided today)   Reviewed expectations re: course of current medical issues.  Discussed self-management of symptoms.  Outlined signs and symptoms indicating need for more acute intervention.  Patient verbalized understanding and all questions were answered.  Patient received an After-Visit Summary.    Orders Placed This Encounter  Procedures  . Ambulatory referral to Allergy   No orders of the defined types were placed in this encounter.   Referral Orders     Ambulatory referral to Allergy   Note is dictated utilizing voice recognition software. Although note has been proof read prior to signing, occasional typographical errors still can be missed. If any questions arise, please do not hesitate to call for verification.   electronically signed by:  Felix Pacini, DO  [facility] - OR

## 2020-07-01 NOTE — Addendum Note (Signed)
Addended by: Maxie Barb on: 07/01/2020 10:33 AM   Modules accepted: Orders

## 2020-07-01 NOTE — Patient Instructions (Addendum)
Start xyzal 1 tab before bed- every night. This helps to prevent hives.   Start vit d 800 u OTC daily with food.  Once weekly b12 injections by nurse appt for 4 weeks. Also, start b12 sublingual tab or solution, daily.   Vitamin B12 Deficiency Vitamin B12 deficiency means that your body does not have enough vitamin B12. The body needs this vitamin:  To make red blood cells.  To make genes (DNA).  To help the nerves work. If you do not have enough vitamin B12 in your body, you can have health problems. What are the causes?  Not eating enough foods that contain vitamin B12.  Not being able to absorb vitamin B12 from the food that you eat.  Certain digestive system diseases.  A condition in which the body does not make enough of a certain protein, which results in too few red blood cells (pernicious anemia).  Having a surgery in which part of the stomach or small intestine is removed.  Taking medicines that make it hard for the body to absorb vitamin B12. These medicines include: ? Heartburn medicines. ? Some antibiotic medicines. ? Other medicines that are used to treat certain conditions. What increases the risk?  Being older than age 64.  Eating a vegetarian or vegan diet, especially while you are pregnant.  Eating a poor diet while you are pregnant.  Taking certain medicines.  Having alcoholism. What are the signs or symptoms? In some cases, there are no symptoms. If the condition leads to too few blood cells or nerve damage, symptoms can occur, such as:  Feeling weak.  Feeling tired (fatigued).  Not being hungry.  Weight loss.  A loss of feeling (numbness) or tingling in your hands and feet.  Redness and burning of the tongue.  Being mixed up (confused) or having memory problems.  Sadness (depression).  Problems with your senses. This can include color blindness, ringing in the ears, or loss of taste.  Watery poop (diarrhea) or trouble pooping  (constipation).  Trouble walking. If anemia is very bad, symptoms can include:  Being short of breath.  Being dizzy.  Having a very fast heartbeat. How is this treated?  Changing the way you eat and drink, such as: ? Eating more foods that contain vitamin B12. ? Drinking little or no alcohol.  Getting vitamin B12 shots.  Taking vitamin B12 supplements. Your doctor will tell you the dose that is best for you. Follow these instructions at home: Eating and drinking  Eat lots of healthy foods that contain vitamin B12. These include: ? Meats and poultry, such as beef, pork, chicken, [place], and organ meats, such as liver. ? Seafood, such as clams, rainbow trout, salmon, tuna, and haddock. ? Eggs. ? Cereal and dairy products that have vitamin B12 added to them. Check the label. The items listed above may not be a complete list of what you can eat and drink. Contact a dietitian for more options.   General instructions  Get any shots as told by your doctor.  Take supplements only as told by your doctor.  Do not drink alcohol if your doctor tells you not to. In some cases, you may only be asked to limit alcohol use.  Keep all follow-up visits as told by your doctor. This is important. Contact a doctor if:  Your symptoms come back. Get help right away if:  You have trouble breathing.  You have a very fast heartbeat.  You have chest pain.  You get dizzy.  You pass out. Summary  Vitamin B12 deficiency means that your body is not getting enough vitamin B12.  In some cases, there are no symptoms of this condition.  Treatment may include making a change in the way you eat and drink, getting vitamin B12 shots, or taking supplements.  Eat lots of healthy foods that contain vitamin B12. This information is not intended to replace advice given to you by your health care provider. Make sure you discuss any questions you have with your health care provider. Document Revised:  12/25/2017 Document Reviewed: 12/25/2017 Elsevier Patient Education  2021 [employer].

## 2020-07-02 MED FILL — VYVANSE 30 MG CAPSULE: 30 | 30 days supply | Qty: 30 | Fill #0

## 2020-07-07 ENCOUNTER — Other Ambulatory Visit (HOSPITAL_COMMUNITY): Payer: Self-pay | Admitting: Family Medicine

## 2020-07-07 ENCOUNTER — Other Ambulatory Visit: Payer: Self-pay

## 2020-07-07 ENCOUNTER — Ambulatory Visit (INDEPENDENT_AMBULATORY_CARE_PROVIDER_SITE_OTHER): Payer: No Typology Code available for payment source

## 2020-07-07 ENCOUNTER — Telehealth: Payer: Self-pay

## 2020-07-07 ENCOUNTER — Ambulatory Visit (INDEPENDENT_AMBULATORY_CARE_PROVIDER_SITE_OTHER): Payer: No Typology Code available for payment source | Admitting: Licensed Clinical Social Worker

## 2020-07-07 ENCOUNTER — Encounter (HOSPITAL_COMMUNITY): Payer: Self-pay | Admitting: Licensed Clinical Social Worker

## 2020-07-07 DIAGNOSIS — F411 Generalized anxiety disorder: Secondary | ICD-10-CM | POA: Diagnosis not present

## 2020-07-07 DIAGNOSIS — E538 Deficiency of other specified B group vitamins: Secondary | ICD-10-CM

## 2020-07-07 MED ORDER — CYANOCOBALAMIN 1000 MCG/ML IJ SOLN: 1000.0000 ug | INTRAMUSCULAR | 0 refills | Status: DC

## 2020-07-07 MED ORDER — CYANOCOBALAMIN 1000 MCG/ML IJ SOLN
1000.0000 ug | Freq: Once | INTRAMUSCULAR | Status: AC
Start: 2020-07-07 — End: 2020-07-07
  Administered 2020-07-07: 1000 ug via INTRAMUSCULAR

## 2020-07-07 MED FILL — CYANOCOBALAMIN 1,000 MCG/ML: 1000 | 14 days supply | Qty: 2 | Fill #0

## 2020-07-07 MED FILL — BD 3 ML SYRINGE 25GX1: 25G X 1" | 14 days supply | Qty: 2 | Fill #0

## 2020-07-07 NOTE — Telephone Encounter (Signed)
Pt is interested in doing b12 injections at home. Pt is going to be going out of town and will miss next injection due to travel. Is it possible for pt to have b12 injections sent to pharmacy?

## 2020-07-07 NOTE — Addendum Note (Signed)
Addended by: Felix Pacini A on: 07/07/2020 01:56 PM   Modules accepted: Orders

## 2020-07-07 NOTE — Telephone Encounter (Signed)
May complete last 2 (weekly) injections of b12 at home.  [facility] transfer to [facility] pharmacy if she wants.

## 2020-07-07 NOTE — Progress Notes (Signed)
Virtual Visit via Video Note  I connected with Cindy Jones  On 07/07/20 at 1:00pm EST by a video enabled telemedicine application and verified that I am speaking with the correct person using two identifiers.   I discussed the limitations of evaluation and management by telemedicine and the availability of in person appointments. The patient expressed understanding and agreed to proceed.  LOCATION: Patient:Home Provider: Home Office  History of Present Illness: Pt was referred to therapy by her psychiatrist at [facility] for anxiety and panic attacks.   Participation Level: Active   Type of Therapy: Virtual Video individual therapy  Treatment Goals addressed: Improve psychiatric symptoms, Controlled Behavior, Moderated Mood, Improve Unhelpful Thought Patterns, Emotional Regulation Skills (Moderate moods, anger management, stress management), Feel and express a full Range of Emotions, Learn about Diagnosis, Healthy Coping Skills.  Interventions: CBT/Supportive/Psychoeducation  Summary: Patient presented for today'Jones session on time and was alert, oriented x5, with no evidence or self-report of SI/HI or A/V H.  Patient reported ongoing compliance with medication.  Clinician inquired about patient'Jones current emotional ratings, as well as any significant changes in thoughts, feelings or behavior since previous session.  Patient reported scores of 2/10 for depression, 4/10 for anxiety, 0/10 for anger/irritability, and denied any reoccurrence of panic attacks.Patient reports on her current health issues, labs and heart monitor. She has begun B-12 shots and has 2 more  To go. Pt admits she is feeling much better, not sluggish, able to get up in the mornings. Pt reports on a disagreement with her best friend where she was able to be transparent with her feelings. Cln explored relationship circles with patient, honoring boundaries. Pt described the stress associated with school. Cln explored meditative coping  skills to assist patient with school stress. Patient is going with her husband to a wedding/holiday trip. Cln encouraged pt to have fun, use coping skills for stress related issues. Patient will skip session next week due to vacation.     Assessment and plan: Counselor will continue to meet with patient to address treatment plan goals. Patient will continue to follow recommendations of providers and implement skills learned in session.  Suicidal/Homicidal: Nowithout intent/plan  Therapist Response: Assessed [name] current functioning and reviewed progress.. Assisted pt processing stressors, negative thoughts.  Assisted pt processing for the management of her stressors.  Participation Level: Active  Diagnosis: Axis I.:  GAD    Follow Up Instructions: I discussed the assessment and treatment plan with the patient. The patient was provided an opportunity to ask questions and all were answered. The patient agreed with the plan and demonstrated an understanding of the instructions.   The patient was advised to call back or seek an in-person evaluation if the symptoms worsen or if the condition fails to improve as anticipated.  I provided 60 minutes of non-face-to-face time during this encounter.   Cindy Jones, LCAS

## 2020-07-07 NOTE — Telephone Encounter (Signed)
Spoke with pt regarding rx and having rx transferred to [facility] from [facility]

## 2020-07-07 NOTE — Progress Notes (Signed)
Cindy Jones is a 30 y.o. female presents to the office today for b12 injections, per physician's orders. Original order: 07/01/20 Cyanocobalamin 1000 mcg,  IM (route) was administered left deltoid (location) today. Patient tolerated injection. Patient due for follow up labs/provider appt: No. Date due: n/a, appt made No Patient next injection due: n/a, appt made No  Paschal Dopp

## 2020-07-14 ENCOUNTER — Ambulatory Visit (HOSPITAL_COMMUNITY): Payer: No Typology Code available for payment source | Admitting: Licensed Clinical Social Worker

## 2020-07-19 ENCOUNTER — Ambulatory Visit (HOSPITAL_COMMUNITY): Payer: No Typology Code available for payment source | Attending: Cardiovascular Disease

## 2020-07-19 ENCOUNTER — Other Ambulatory Visit: Payer: Self-pay

## 2020-07-19 DIAGNOSIS — R55 Syncope and collapse: Secondary | ICD-10-CM | POA: Insufficient documentation

## 2020-07-19 LAB — ECHOCARDIOGRAM COMPLETE
Area-P 1/2: 3.93 cm2
S' Lateral: 2.7 cm

## 2020-07-21 ENCOUNTER — Other Ambulatory Visit: Payer: Self-pay

## 2020-07-21 ENCOUNTER — Ambulatory Visit (HOSPITAL_COMMUNITY): Payer: No Typology Code available for payment source | Admitting: Licensed Clinical Social Worker

## 2020-07-21 ENCOUNTER — Ambulatory Visit (INDEPENDENT_AMBULATORY_CARE_PROVIDER_SITE_OTHER): Payer: No Typology Code available for payment source | Admitting: Licensed Clinical Social Worker

## 2020-07-21 ENCOUNTER — Encounter (HOSPITAL_COMMUNITY): Payer: Self-pay | Admitting: Licensed Clinical Social Worker

## 2020-07-21 DIAGNOSIS — F411 Generalized anxiety disorder: Secondary | ICD-10-CM | POA: Diagnosis not present

## 2020-07-21 NOTE — Progress Notes (Signed)
Virtual Visit via Video Note  I connected with Cindy Jones  On 07/21/20 at 1:00pm EST by a video enabled telemedicine application and verified that I am speaking with the correct person using two identifiers.   I discussed the limitations of evaluation and management by telemedicine and the availability of in person appointments. The patient expressed understanding and agreed to proceed.  LOCATION: Patient:Home Provider: Home Office  History of Present Illness: Pt was referred to therapy by her psychiatrist at [facility] for anxiety and panic attacks.   Participation Level: Active   Type of Therapy: Virtual Video individual therapy  Treatment Goals addressed: Improve psychiatric symptoms, Controlled Behavior, Moderated Mood, Improve Unhelpful Thought Patterns, Emotional Regulation Skills (Moderate moods, anger management, stress management), Feel and express a full Range of Emotions, Learn about Diagnosis, Healthy Coping Skills.  Interventions: CBT/Supportive/Psychoeducation  Summary: Patient presented for today's session on time and was alert, oriented x5, with no evidence or self-report of SI/HI or A/V H.  Patient reported ongoing compliance with medication.  Clinician inquired about patient's current emotional ratings, as well as any significant changes in thoughts, feelings or behavior since previous session.  Patient reported scores of 2/10 for depression, 2/10 for anxiety, 0/10 for anger/irritability, and denied any reoccurrence of panic attacks.Patient reports on her current health issues, labs and heart monitor. Pt excitedly reports on her vacation she spent in [place], [place]. "I learned a lot about my husband while we were gone because this was out first trip together without family."  Clinician utilized MI OARS to affirm her concerns. Clinician challenged her thoughts. Clinician processed options for communicating her concerns.       Assessment and plan: Counselor will  continue to meet with patient to address treatment plan goals. Patient will continue to follow recommendations of providers and implement skills learned in session.  Suicidal/Homicidal: Nowithout intent/plan  Therapist Response: Assessed pt's current functioning and reviewed progress.. Assisted pt processing stressors, negative thoughts.  Assisted pt processing for the management of her stressors.  Participation Level: Active  Diagnosis: Axis I.:  GAD    Follow Up Instructions: I discussed the assessment and treatment plan with the patient. The patient was provided an opportunity to ask questions and all were answered. The patient agreed with the plan and demonstrated an understanding of the instructions.   The patient was advised to call back or seek an in-person evaluation if the symptoms worsen or if the condition fails to improve as anticipated.  I provided 60 minutes of non-face-to-face time during this encounter.   Damari Hiltz S, LCAS

## 2020-07-22 ENCOUNTER — Other Ambulatory Visit (HOSPITAL_COMMUNITY): Payer: Self-pay | Admitting: Psychiatry

## 2020-07-22 ENCOUNTER — Other Ambulatory Visit: Payer: Self-pay

## 2020-07-22 ENCOUNTER — Telehealth (INDEPENDENT_AMBULATORY_CARE_PROVIDER_SITE_OTHER): Payer: No Typology Code available for payment source | Admitting: Psychiatry

## 2020-07-22 ENCOUNTER — Ambulatory Visit: Payer: Self-pay | Admitting: Allergy

## 2020-07-22 DIAGNOSIS — F410 Panic disorder [episodic paroxysmal anxiety] without agoraphobia: Secondary | ICD-10-CM | POA: Diagnosis not present

## 2020-07-22 DIAGNOSIS — F411 Generalized anxiety disorder: Secondary | ICD-10-CM

## 2020-07-22 DIAGNOSIS — F952 Tourette's disorder: Secondary | ICD-10-CM | POA: Diagnosis not present

## 2020-07-22 DIAGNOSIS — F988 Other specified behavioral and emotional disorders with onset usually occurring in childhood and adolescence: Secondary | ICD-10-CM | POA: Diagnosis not present

## 2020-07-22 MED ORDER — LISDEXAMFETAMINE DIMESYLATE 30 MG PO CAPS
30.0000 mg | ORAL_CAPSULE | Freq: Every day | ORAL | 0 refills | Status: DC
Start: 2020-07-22 — End: 2020-07-22

## 2020-07-22 NOTE — Progress Notes (Signed)
Virtual Visit via Video Note  I connected with Cindy Jones on 07/22/20 at 10:30 AM EDT by a video enabled telemedicine application and verified that I am speaking with the correct person using two identifiers.  Location: Patient: home Provider: office   I discussed the limitations of evaluation and management by telemedicine and the availability of in person appointments. The patient expressed understanding and agreed to proceed.  History of Present Illness: Cindy Jones states she is doing well. She is taking Vit B12 and it has significantly helped her mood and concentration and energy. She denies symptoms of depression. Her anxiety is mild and more situational. Shigeko denies panic attacks. She denies SI/HI. Her sleep is good and feels better than before. The Vyvanse at 30mg  is effective. She is productive at school and work. She does have to rely on alarms and other ways to keep her concentration at times. The effect of Vyvanse comes on slowly and it does not feel as powerful as Adderall. She is not having tics as much during the day. Tics do occur later in the day which also occurred with Adderall. She is denying headaches, irritability and decreased appetite.    Observations/Objective: Psychiatric Specialty Exam: ROS  There were no vitals taken for this visit.There is no height or weight on file to calculate BMI.  General Appearance: Fairly Groomed  Eye Contact:  Good  Speech:  Clear and Coherent and Normal Rate  Volume:  Normal  Mood:  Anxious  Affect:  Full Range  Thought Process:  Goal Directed, Linear and Descriptions of Associations: Intact  Orientation:  Full (Time, Place, and Person)  Thought Content:  Logical  Suicidal Thoughts:  No  Homicidal Thoughts:  No  Memory:  Immediate;   Good  Judgement:  Good  Insight:  Good  Psychomotor Activity:  Normal  Concentration:  Concentration: Good  Recall:  Good  Fund of Knowledge:  Good  Language:  Good  Akathisia:  No  Handed:  Right   AIMS (if indicated):     Assets:  Communication Skills Desire for Improvement Financial Resources/Insurance Housing Intimacy Leisure Time Physical Health Resilience Social Support Talents/Skills Transportation Vocational/Educational  ADL's:  Intact  Cognition:  WNL  Sleep:        Assessment and Plan: 1. ADD (attention deficit disorder) without hyperactivity - lisdexamfetamine (VYVANSE) 30 MG capsule; Take 1 capsule (30 mg total) by mouth daily.  Dispense: 30 capsule; Refill: 0 - lisdexamfetamine (VYVANSE) 30 MG capsule; Take 1 capsule (30 mg total) by mouth daily.  Dispense: 30 capsule; Refill: 0  2. Tourette disease  3. Generalized anxiety disorder  4. Panic attacks  Depression screen [facility] 2/9 07/22/2020 06/24/2020 06/07/2020 10/27/2019 06/03/2018  Decreased Interest 0 0 0 0 0  Down, Depressed, Hopeless 0 0 0 0 0  PHQ - 2 Score 0 0 0 0 0  Altered sleeping - - - - 1  Tired, decreased energy - - - - 1  Change in appetite - - - - 0  Feeling bad or failure about yourself  - - - - 1  Trouble concentrating - - - - 1  Moving slowly or fidgety/restless - - - - 0  Suicidal thoughts - - - - 0  PHQ-9 Score - - - - 4  Difficult doing work/chores - - - - Not difficult at all  Some recent data might be hidden   Flowsheet Row Video Visit [facility] 07/22/2020 in [facility] Video Visit from 06/24/2020  [facility]  C-SSRS RISK CATEGORY No Risk No Risk       Follow Up Instructions: In 2-3 months or sooner if needed   I discussed the assessment and treatment plan with the patient. The patient was provided an opportunity to ask questions and all were answered. The patient agreed with the plan and demonstrated an understanding of the instructions.   The patient was advised to call back or seek an in-person evaluation if the symptoms worsen or if the condition fails to improve as anticipated.    Oletta Darter, MD

## 2020-07-28 ENCOUNTER — Ambulatory Visit (INDEPENDENT_AMBULATORY_CARE_PROVIDER_SITE_OTHER): Payer: No Typology Code available for payment source | Admitting: Licensed Clinical Social Worker

## 2020-07-28 ENCOUNTER — Other Ambulatory Visit: Payer: Self-pay

## 2020-07-28 ENCOUNTER — Encounter (HOSPITAL_COMMUNITY): Payer: Self-pay | Admitting: Licensed Clinical Social Worker

## 2020-07-28 DIAGNOSIS — F411 Generalized anxiety disorder: Secondary | ICD-10-CM | POA: Diagnosis not present

## 2020-07-28 NOTE — Progress Notes (Signed)
Virtual Visit via Video Note  I connected with Cindy Jones  On 07/28/20 at 1:00pm EST by a video enabled telemedicine application and verified that I am speaking with the correct person using two identifiers.   I discussed the limitations of evaluation and management by telemedicine and the availability of in person appointments. The patient expressed understanding and agreed to proceed.  LOCATION: Patient:Home Provider: Home Office  History of Present Illness: Pt was referred to therapy by her psychiatrist at [facility] for anxiety and panic attacks.   Participation Level: Active   Type of Therapy: Virtual Video individual therapy  Treatment Goals addressed: Improve psychiatric symptoms, Controlled Behavior, Moderated Mood, Improve Unhelpful Thought Patterns, Emotional Regulation Skills (Moderate moods, anger management, stress management), Feel and express a full Range of Emotions, Learn about Diagnosis, Healthy Coping Skills.  Interventions: CBT/Supportive/Psychoeducation  Summary: Patient presented for today's session on time and was alert, oriented x5, with no evidence or self-report of SI/HI or A/V H.  Patient reported ongoing compliance with medication.  Clinician inquired about patient's current emotional ratings, as well as any significant changes in thoughts, feelings or behavior since previous session.  Patient reported scores of 1/10 for depression, 5/10 for anxiety, 0/10 for anger/irritability, and denied any reoccurrence of panic attacks. patient presented agitated for her session, however denied being agitated. Patient reports "I'm just overwhelmed with school, work and personal responsibilities." Cln provided psychoeducation on compartmentalization. Pt reports she is currently helping her rwin sister and it's causing some problems with their relationship. Cln provided support for patient and suggested she use boundaries even with her sister. Clinician utilized MI OARS to reflect and  summarize her thoughts and feelings.            Assessment and plan: Counselor will continue to meet with patient to address treatment plan goals. Patient will continue to follow recommendations of providers and implement skills learned in session.  Suicidal/Homicidal: Nowithout intent/plan  Therapist Response: Assessed pt's current functioning and reviewed progress.. Assisted pt processing stressors, negative thoughts.  Assisted pt processing for the management of her stressors.  Participation Level: Active  Diagnosis: Axis I.:  GAD    Follow Up Instructions: I discussed the assessment and treatment plan with the patient. The patient was provided an opportunity to ask questions and all were answered. The patient agreed with the plan and demonstrated an understanding of the instructions.   The patient was advised to call back or seek an in-person evaluation if the symptoms worsen or if the condition fails to improve as anticipated.  I provided 45 minutes of non-face-to-face time during this encounter.   Breyana Follansbee S, LCAS

## 2020-08-04 ENCOUNTER — Encounter (HOSPITAL_COMMUNITY): Payer: Self-pay | Admitting: Licensed Clinical Social Worker

## 2020-08-04 ENCOUNTER — Ambulatory Visit (INDEPENDENT_AMBULATORY_CARE_PROVIDER_SITE_OTHER): Payer: No Typology Code available for payment source | Admitting: Licensed Clinical Social Worker

## 2020-08-04 ENCOUNTER — Other Ambulatory Visit: Payer: Self-pay

## 2020-08-04 DIAGNOSIS — F411 Generalized anxiety disorder: Secondary | ICD-10-CM

## 2020-08-04 NOTE — Progress Notes (Signed)
Virtual Visit via Video Note  I connected with Cindy Jones  On 08/04/20 at 1:00pm EST by a video enabled telemedicine application and verified that I am speaking with the correct person using two identifiers.   I discussed the limitations of evaluation and management by telemedicine and the availability of in person appointments. The patient expressed understanding and agreed to proceed.  LOCATION: Patient:Home Provider: Home Office  History of Present Illness: Pt was referred to therapy by her psychiatrist at [facility] for anxiety and panic attacks.   Participation Level: Active   Type of Therapy: Virtual Video individual therapy  Treatment Goals addressed: Improve psychiatric symptoms, Controlled Behavior, Moderated Mood, Improve Unhelpful Thought Patterns, Emotional Regulation Skills (Moderate moods, anger management, stress management), Feel and express a full Range of Emotions, Learn about Diagnosis, Healthy Coping Skills.  Interventions: CBT/Supportive/Psychoeducation  Summary: Patient presented for today's session on time and was alert, oriented x5, with no evidence or self-report of SI/HI or A/V H.  Patient reported ongoing compliance with medication.  Clinician inquired about patient's current emotional ratings, as well as any significant changes in thoughts, feelings or behavior since previous session.  Patient reported scores of 1/10 for depression, 6/10 for anxiety, 3/10 for anger/irritability, and denied any reoccurrence of panic attacks. patient presented agitated for her session, however denied being agitated. Patient reports on her increase in anxiety symptoms: Cln used CBT to assist patient in stabilizing her ability to function on a daily basis by handling effectively her life's anxieties. Cln assisted patient identifying anxiety coping mechanisms that has been successful in the past and suggested pt increase it's use between sessions.                  Assessment and  plan: Counselor will continue to meet with patient to address treatment plan goals. Patient will continue to follow recommendations of providers and implement skills learned in session.  Suicidal/Homicidal: Nowithout intent/plan  Therapist Response: Assessed pt's current functioning and reviewed progress.. Assisted pt processing stressors, negative thoughts.  Assisted pt processing for the management of her stressors.  Participation Level: Active  Diagnosis: Axis I.:  GAD    Follow Up Instructions: I discussed the assessment and treatment plan with the patient. The patient was provided an opportunity to ask questions and all were answered. The patient agreed with the plan and demonstrated an understanding of the instructions.   The patient was advised to call back or seek an in-person evaluation if the symptoms worsen or if the condition fails to improve as anticipated.  I provided 60 minutes of non-face-to-face time during this encounter.   Sharief Wainwright S, LCAS

## 2020-08-06 NOTE — Progress Notes (Signed)
NEUROLOGY CONSULTATION NOTE  Cindy Jones MRN: [id number] DOB: 07-Aug-1990  Referring provider: Felix Pacini, DO Primary care provider: Felix Pacini, DO  Reason for consult:  migraines  Assessment/Plan:   Migraine with aura, without status migrainosus, not intractable  1.  Defer migraine preventative for now. 2.  For migraine rescue:  Maxalt-MLT 10mg  and Zofran ODT 4mg  3.  Limit use of pain relievers to no more than 2 days out of week to prevent risk of rebound or medication-overuse headache. 4.  Keep headache diary 5.  Follow up 4 to 6 months.   Subjective:  Cindy Jones is a 30 year old right-handed female with Tourette disorder, ADHD, generalized anxiety disorder and possible POTS who presents for migraines.  History supplemented by ED, PCP's and prior neurologist's notes.  She reports history of migraines since high school. Since early 2019, she started having preceding auras described as running water over her eyes lasting 10-15 minutes.  They are severe throbbing pain located either side of head, bilateral, top or back of head.  There is associated nausea, photophobia, osmophobia and more recently vomiting.  Typically last all day.  Frequency varies, occurs 1-2 times a month but may have 1 to 3 months without any migraine.  She was seen in the ED for an intractable migraine of 4 days duration in January 2022.  CT head at that time personally reviewed was normal.  Triggers include stress, sometimes menstruation, and possibly eye twitching due to her tourette's.  Treats with Excedrin Migraine.  She also reports longstanding history of dizziness.  She feels lightheaded when she stands up in the morning.  She has passed out.  She has been evaluated by cardiology. No clear etiology.   Current NSAIDS/analgesics:  Excedrin Migraine Current triptans:  none Current ergotamine:  none Current anti-emetic:  none Current muscle relaxants:  none Current Antihypertensive medications:   none Current Antidepressant medications:  none Current Anticonvulsant medications:  none Current anti-CGRP:  none Current Vitamins/Herbal/Supplements:  B12, D Current Antihistamines/Decongestants:  none Other therapy:  none Hormone/birth control:  none Other medications:  none  Past NSAIDS/analgesics:  Ibuprofen, Tylenol with caffeine Past abortive triptans:  none Past abortive ergotamine:  none Past muscle relaxants:  none Past anti-emetic:  Zofran Past antihypertensive medications:  propranolol Past antidepressant medications:  Vyvanse Past anticonvulsant medications:  none Past anti-CGRP:  none Past vitamins/Herbal/Supplements:  melatonin Past antihistamines/decongestants:  Zyrtec, Flonase, Claritin, Sudafed Other past therapies:  none  Caffeine:  Was drinking 3-4 cups coffee daily but now only 1 cup daily since January Diet:  80 to 100 oz water daily.  May skip breakfast Exercise:  Walks dog daily.   Depression:  no; Anxiety:  Yes but controlled.  ADD.  In grad school for data analysis Other pain:  Tension in head Sleep:  Good.  May use melatonin Family history of headache:  Father (migraines).  No family history of aneurysms.     PAST MEDICAL HISTORY: Past Medical History:  Diagnosis Date  . Anxiety   . Asthma    as a child  . Binge eating disorder   . Dysrhythmia    " I feel it about once a month"  . Frequent headaches   . GAD (generalized anxiety disorder)   . GERD (gastroesophageal reflux disease)   . Major depression in complete remission (HCC)   . Migraines   . PID (acute pelvic inflammatory disease)   . Seasonal allergies   . Tourette disorder   .  Urinary tract bacterial infections     PAST SURGICAL HISTORY: Past Surgical History:  Procedure Laterality Date  . CHOLECYSTECTOMY    . ESOPHAGOGASTRODUODENOSCOPY    . LAPAROSCOPIC CHOLECYSTECTOMY SINGLE SITE WITH INTRAOPERATIVE CHOLANGIOGRAM N/A 03/15/2016   Procedure: LAPAROSCOPIC CHOLECYSTECTOMY SINGLE  SITE WITH INTRAOPERATIVE CHOLANGIOGRAM;  Surgeon: Karie Soda, MD;  Location: [facility] OR;  Service: General;  Laterality: N/A;  . TONSILLECTOMY AND ADENOIDECTOMY  2013  . WISDOM TOOTH EXTRACTION      MEDICATIONS: Current Outpatient Medications on File Prior to Visit  Medication Sig Dispense Refill  . albuterol (VENTOLIN HFA) 108 (90 Base) MCG/ACT inhaler 1-2 puffs 15 minutes prior to exercise. 18 g 5  . amphetamine-dextroamphetamine (ADDERALL XR) 25 MG 24 hr capsule TAKE 1 CAPSULE BY MOUTH EVERY MORNING. 30 capsule 0  . amphetamine-dextroamphetamine (ADDERALL XR) 25 MG 24 hr capsule TAKE 1 CAPSULE BY MOUTH DAILY. 30 capsule 0  . amphetamine-dextroamphetamine (ADDERALL) 20 MG tablet TAKE 1 TABLET BY MOUTH DAILY AS NEEDED. 30 tablet 0  . aspirin-acetaminophen-caffeine (EXCEDRIN MIGRAINE) 250-250-65 MG per tablet Take 1-2 tablets by mouth every 6 (six) hours as needed for headache.     . cyanocobalamin (,VITAMIN B-12,) 1000 MCG/ML injection INJECT 1 ML INTO THE MUSCLE ONCE A WEEK 2 mL 0  . Cyanocobalamin (VITAMIN B-12) 1000 MCG SUBL Place under the tongue.    . lisdexamfetamine (VYVANSE) 20 MG capsule TAKE 1 CAPSULE BY MOUTH DAILY. 30 capsule 0  . lisdexamfetamine (VYVANSE) 30 MG capsule TAKE 1 CAPSULE BY MOUTH DAILY. MAY FILL. MAY FILL 09/10/20 30 capsule 0  . lisdexamfetamine (VYVANSE) 30 MG capsule TAKE 1 CAPSULE BY MOUTH DAILY. MAY FILL 08/16/20 30 capsule 0  . SYRINGE-NEEDLE, DISP, 3 ML 25G X 1" 3 ML MISC USE AS DIRECTED WITH VITAMIN B12 INJECTION 2 each 0  . VITAMIN D, CHOLECALCIFEROL, PO Take by mouth.     No current facility-administered medications on file prior to visit.    ALLERGIES: Allergies  Allergen Reactions  . Latex Swelling and Rash  . Cranberry Juice Powder Other (See Comments)    Throat pain.  . Naproxen Other (See Comments)    "Liver starts hurting" Diarrhea  . Nickel     Skin peeling  . Sulfa Antibiotics Hives and Swelling    FAMILY HISTORY: Family History   Problem Relation Age of Onset  . Alcohol abuse Mother   . Bipolar disorder Mother   . Cancer Father        sarcoma; passed away when pt was 8  . Tourette syndrome Maternal Aunt   . Alcohol abuse Maternal Aunt   . Hyperlipidemia Paternal Aunt   . Tourette syndrome Maternal Grandfather   . Heart disease Paternal Grandfather   . Alcohol abuse Maternal Uncle   . Suicidality Neg Hx     Objective:  Blood pressure 119/79, pulse 78, resp. rate 18, height 5\' 6"  (1.676 m), weight 181 lb (82.1 kg), SpO2 96 %. General: No acute distress.  Patient appears well-groomed.   Head:  Normocephalic/atraumatic Eyes:  fundi examined but not visualized Neck: supple, no paraspinal tenderness, full range of motion Back: No paraspinal tenderness Heart: regular rate and rhythm Lungs: Clear to auscultation bilaterally. Vascular: No carotid bruits. Neurological Exam: Mental status: alert and oriented to person, place, and time, recent and remote memory intact, fund of knowledge intact, attention and concentration intact, speech fluent and not dysarthric, language intact. Cranial nerves: CN I: not tested CN II: pupils equal, round and reactive to light, visual  fields intact CN III, IV, VI:  full range of motion, no nystagmus, no ptosis CN V: facial sensation intact. CN VII: upper and lower face symmetric CN VIII: hearing intact CN IX, X: gag intact, uvula midline CN XI: sternocleidomastoid and trapezius muscles intact CN XII: tongue midline Bulk & Tone: normal, no fasciculations. Motor:  muscle strength 5/5 throughout Sensation:  Pinprick, temperature and vibratory sensation intact. Deep Tendon Reflexes:  2+ throughout,  toes downgoing.   Finger to nose testing:  Without dysmetria.   Heel to shin:  Without dysmetria.   Gait:  Normal station and stride.  Romberg negative.    Thank you for allowing me to take part in the care of this patient.  Shon Millet, DO  CC: Felix Pacini, DO

## 2020-08-09 ENCOUNTER — Encounter: Payer: Self-pay | Admitting: Neurology

## 2020-08-09 ENCOUNTER — Other Ambulatory Visit: Payer: Self-pay

## 2020-08-09 ENCOUNTER — Other Ambulatory Visit (HOSPITAL_COMMUNITY): Payer: Self-pay

## 2020-08-09 ENCOUNTER — Ambulatory Visit: Payer: No Typology Code available for payment source | Admitting: Neurology

## 2020-08-09 VITALS — BP 119/79 | HR 78 | Resp 18 | Ht 66.0 in | Wt 181.0 lb

## 2020-08-09 DIAGNOSIS — G43109 Migraine with aura, not intractable, without status migrainosus: Secondary | ICD-10-CM

## 2020-08-09 MED ORDER — ONDANSETRON 4 MG PO TBDP
4.0000 mg | ORAL_TABLET | Freq: Three times a day (TID) | ORAL | 5 refills | Status: DC | PRN
  Filled 2020-08-09: qty 20, 7d supply, fill #0

## 2020-08-09 MED ORDER — RIZATRIPTAN BENZOATE 10 MG PO TBDP
ORAL_TABLET | ORAL | 5 refills | Status: DC
  Filled 2020-08-09: qty 10, 30d supply, fill #0

## 2020-08-09 NOTE — Patient Instructions (Signed)
  1. Take rizatriptan 10mg  at earliest onset of headache.  May repeat dose once in 2 hours if needed.  Maximum 2 tablets in 24 hours.  Take ondansetron for nausea 2. Limit use of pain relievers to no more than 2 days out of the week.  These medications include acetaminophen, NSAIDs (ibuprofen/Advil/Motrin, naproxen/Aleve, triptans (Imitrex/sumatriptan), Excedrin, and narcotics.  This will help reduce risk of rebound headaches. 3. Be aware of common food triggers:  - Caffeine:  coffee, black tea, cola, Mt. Dew  - Chocolate  - Dairy:  aged cheeses (brie, blue, cheddar, gouda, [place], provolone, [place], Swiss, etc), chocolate milk, buttermilk, sour cream, limit eggs and yogurt  - Nuts, peanut butter  - Alcohol  - Cereals/grains:  FRESH breads (fresh bagels, sourdough, doughnuts), yeast productions  - Processed/canned/aged/cured meats (pre-packaged deli meats, hotdogs)  - MSG/glutamate:  soy sauce, flavor enhancer, pickled/preserved/marinated foods  - Sweeteners:  aspartame (Equal, Nutrasweet).  Sugar and Splenda are okay  - Vegetables:  legumes (lima beans, lentils, snow peas, fava beans, pinto peans, peas, garbanzo beans), sauerkraut, onions, olives, pickles  - Fruit:  avocados, bananas, citrus fruit (orange, lemon, grapefruit), mango  - Other:  Frozen meals, macaroni and cheese 4. Routine exercise 5. Stay adequately hydrated (aim for 64 oz water daily) 6. Keep headache diary 7. Maintain proper stress management 8. Maintain proper sleep hygiene 9. Do not skip meals 10. Consider supplements:  magnesium citrate 400mg  daily, riboflavin 400mg  daily, coenzyme Q10 100mg  three times daily. 11.  Follow up 4 to 6 months.

## 2020-08-11 ENCOUNTER — Other Ambulatory Visit: Payer: Self-pay

## 2020-08-11 ENCOUNTER — Telehealth (INDEPENDENT_AMBULATORY_CARE_PROVIDER_SITE_OTHER): Payer: No Typology Code available for payment source | Admitting: Family Medicine

## 2020-08-11 ENCOUNTER — Encounter (HOSPITAL_COMMUNITY): Payer: Self-pay | Admitting: Licensed Clinical Social Worker

## 2020-08-11 ENCOUNTER — Telehealth: Payer: No Typology Code available for payment source | Admitting: Family

## 2020-08-11 ENCOUNTER — Ambulatory Visit (INDEPENDENT_AMBULATORY_CARE_PROVIDER_SITE_OTHER): Payer: No Typology Code available for payment source | Admitting: Licensed Clinical Social Worker

## 2020-08-11 ENCOUNTER — Encounter: Payer: Self-pay | Admitting: Family Medicine

## 2020-08-11 ENCOUNTER — Other Ambulatory Visit (HOSPITAL_COMMUNITY): Payer: Self-pay

## 2020-08-11 VITALS — Temp 98.2°F

## 2020-08-11 DIAGNOSIS — R109 Unspecified abdominal pain: Secondary | ICD-10-CM

## 2020-08-11 DIAGNOSIS — R3911 Hesitancy of micturition: Secondary | ICD-10-CM

## 2020-08-11 DIAGNOSIS — N898 Other specified noninflammatory disorders of vagina: Secondary | ICD-10-CM | POA: Diagnosis not present

## 2020-08-11 DIAGNOSIS — F411 Generalized anxiety disorder: Secondary | ICD-10-CM

## 2020-08-11 LAB — POCT URINALYSIS DIPSTICK
Bilirubin, UA: NEGATIVE
Blood, UA: 200
Glucose, UA: NEGATIVE
Ketones, UA: NEGATIVE
Nitrite, UA: NEGATIVE
Protein, UA: NEGATIVE
Spec Grav, UA: >=1.03 — AB (ref 1.010–1.025)
Urobilinogen, UA: 1 E.U./dL
pH, UA: 6 (ref 5.0–8.0)

## 2020-08-11 MED ORDER — FLUCONAZOLE 150 MG PO TABS
150.0000 mg | ORAL_TABLET | ORAL | 0 refills | Status: DC
  Filled 2020-08-11: qty 3, 21d supply, fill #0

## 2020-08-11 NOTE — Progress Notes (Signed)
Based on what you shared with me, I feel your condition warrants further evaluation and I recommend that you be seen in a face to face office visit.  Given you are having vaginal discharge and abdominal pain you need to be seen face to rule out a more serious infection.    NOTE: If you entered your credit card information for this eVisit, you will not be charged. You may see a "hold" on your card for the $35 but that hold will drop off and you will not have a charge processed.   If you are having a true medical emergency please call 911.      For an urgent face to face visit, [place] has five urgent care centers for your convenience:     [facility] at [facility] Directions [phone] [address] [place], [place] [address] . 10 am - 6pm Monday - Friday    [facility]) Get Driving Directions [phone] [address] [place], [place] [address] . 10 am to 8 pm Monday-Friday . 12 pm to 8 pm [facility] Center [facility]) Get Driving Directions [phone]  [address] [place],  [place]  [address] . 8 am to 8 pm Monday-Friday . 8 am to 4 pm [facility] Urgent Care at [facility] Get Driving Directions [phone] [address], [address] [place], [place] [address] . 8 am to 8 pm Monday-Friday . 9 am to 6 pm Saturday . 11 am to 6 pm Sunday   [facility] at [facility] Get Driving Directions  [phone] [address] [place], [place] [address] . 8 am to 8 pm Monday-Friday . 8 am to 4 pm [facility] Urgent Care at [facility] Directions [phone] [address] [place], [place] [address] . 12 pm to 6 pm Monday-Friday    VIDEO VISITS:  is now providing on-demand video visits for your convenience. Speak to one of our  providers from the comfort of your home 8 am to 8 pm or after hours through our partner vendor.   For Video Visit details and options:   [email or web address]      Your MyChart E-visit questionnaire answers were reviewed by a board certified advanced clinical practitioner to complete your personal care plan based on your specific symptoms.  Thank you for using e-Visits.

## 2020-08-11 NOTE — Progress Notes (Signed)
VIRTUAL VISIT VIA VIDEO  I connected with Cindy Jones on 08/11/20 at  4:00 PM EDT by elemedicine application and verified that I am speaking with the correct person using two identifiers. Location patient: Home Location provider: [facility], Office Persons participating in the virtual visit: Patient, Dr. Claiborne Billings and Reece Agar. Cesar, CMA  I discussed the limitations of evaluation and management by telemedicine and the availability of in person appointments. The patient expressed understanding and agreed to proceed.   SUBJECTIVE Chief Complaint  Patient presents with  . Vaginal Discharge    Pt c/o vaginal irritation, urine hesitancy, watery vaginal discharge x 2 days;     HPI: Cindy Jones is a 30 y.o. female present for virtual visit today discuss genital irritation and urinary hesitancy.  She reports she had a ED visit 1 month ago and was diagnosed with a yeast infection treated with Diflucan x2.  She reports her symptoms had completely resolved after taking the Diflucan, and until 2 days ago she started to notice recurrent symptoms.  She uses menstrual cups during her menstrual cycles.  She is wondering if she did not thoroughly cleans her menstrual cup and therefore we inoculate with yeast.  She has dropped off her urinalysis prior to her ED visit appointment today.  SHe denies fevers, chills, abdominal pain. Patient's last menstrual period was 08/09/2020.  ROS: See pertinent positives and negatives per HPI.  Patient Active Problem List   Diagnosis Date Noted  . B12 deficiency 07/01/2020  . Exercise-induced asthma 10/27/2019  . Encounter for preventive care 10/27/2019  . Friable cervix 06/09/2019  . History of acute PID 06/09/2019  . GAD (generalized anxiety disorder) 11/07/2018  . Overweight (BMI 25.0-29.9) 06/03/2018  . Bulimia 04/20/2015  . ADD (attention deficit disorder) 01/15/2014  . Tourette disease 10/18/2013    Social History   Tobacco Use  . Smoking status:  Never Smoker  . Smokeless tobacco: Never Used  Substance Use Topics  . Alcohol use: Yes    Alcohol/week: 1.0 standard drink    Types: 1 Glasses of wine per week    Comment: occ    Current Outpatient Medications:  .  albuterol (VENTOLIN HFA) 108 (90 Base) MCG/ACT inhaler, 1-2 puffs 15 minutes prior to exercise., Disp: 18 g, Rfl: 5 .  aspirin-acetaminophen-caffeine (EXCEDRIN MIGRAINE) 250-250-65 MG per tablet, Take 1-2 tablets by mouth every 6 (six) hours as needed for headache. , Disp: , Rfl:  .  Cyanocobalamin (VITAMIN B-12) 1000 MCG SUBL, Place under the tongue., Disp: , Rfl:  .  fluconazole (DIFLUCAN) 150 MG tablet, Take 1 tablet (150 mg total) by mouth once a week., Disp: 3 tablet, Rfl: 0 .  lisdexamfetamine (VYVANSE) 20 MG capsule, TAKE 1 CAPSULE BY MOUTH DAILY., Disp: 30 capsule, Rfl: 0 .  lisdexamfetamine (VYVANSE) 30 MG capsule, TAKE 1 CAPSULE BY MOUTH DAILY. MAY FILL. MAY FILL 09/10/20, Disp: 30 capsule, Rfl: 0 .  lisdexamfetamine (VYVANSE) 30 MG capsule, TAKE 1 CAPSULE BY MOUTH DAILY. MAY FILL 08/16/20, Disp: 30 capsule, Rfl: 0 .  ondansetron (ZOFRAN ODT) 4 MG disintegrating tablet, Dissolve 1 tablet (4 mg total) by mouth every 8 (eight) hours as needed for nausea or vomiting., Disp: 20 tablet, Rfl: 5 .  rizatriptan (MAXALT-MLT) 10 MG disintegrating tablet, Dissolve 1 tablet by mouth at earliest onset of migraine.  May repeat in 2 hours if needed.  Maximum of 2 tablets in 24 hours. (Patient taking differently: Dissolve 1 tablet by mouth at earliest onset of  migraine.  May repeat in 2 hours if needed.  Maximum of 2 tablets in 24 hours.), Disp: 10 tablet, Rfl: 5 .  VITAMIN D, CHOLECALCIFEROL, PO, Take by mouth., Disp: , Rfl:   Allergies  Allergen Reactions  . Latex Swelling and Rash  . Cranberry Juice Powder Other (See Comments)    Throat pain.  . Naproxen Other (See Comments)    "Liver starts hurting" Diarrhea  . Nickel     Skin peeling  . Sulfa Antibiotics Hives and Swelling     OBJECTIVE: Temp 98.2 F (36.8 C) (Oral)   LMP 08/09/2020  Gen: No acute distress. Nontoxic in appearance.  HENT: AT. [place].  MMM.  Eyes:Pupils Equal Round Reactive to light, Extraocular movements intact,  Conjunctiva without redness, discharge or icterus. Neuro:Normal gait. Alert. Oriented x3  Psych: Normal affect and demeanor. Normal speech. Normal thought content and judgment.  ASSESSMENT AND PLAN: Cindy Jones is a 30 y.o. female present for  Urinary hesitancy/vaginal irritation Per history she had improvement in symptoms after taking Diflucan 1 month ago.  Therefore most likely recurrent yeast infection secondary to using menstrual cup. Diflucan x3 once weekly. Proper cleansing of menstrual cup was discussed today - POCT Urinalysis Dipstick-does not appear infectious today will send for culture to be certain - Urinalysis w microscopic + reflex cultur Follow-up as needed    Felix Pacini, DO 08/11/2020     Orders Placed This Encounter  Procedures  . Urinalysis w microscopic + reflex cultur  . POCT Urinalysis Dipstick   Meds ordered this encounter  Medications  . fluconazole (DIFLUCAN) 150 MG tablet    Sig: Take 1 tablet (150 mg total) by mouth once a week.    Dispense:  3 tablet    Refill:  0   Referral Orders  No referral(s) requested today

## 2020-08-11 NOTE — Patient Instructions (Signed)
Vaginal Yeast Infection, Adult  Vaginal yeast infection is a condition that causes vaginal discharge as well as soreness, swelling, and redness (inflammation) of the vagina. This is a common condition. Some women get this infection frequently. What are the causes? This condition is caused by a change in the normal balance of the yeast (candida) and bacteria that live in the vagina. This change causes an overgrowth of yeast, which causes the inflammation. What increases the risk? The condition is more likely to develop in women who:  Take antibiotic medicines.  Have diabetes.  Take birth control pills.  Are pregnant.  Douche often.  Have a weak body defense system (immune system).  Have been taking steroid medicines for a long time.  Frequently wear tight clothing. What are the signs or symptoms? Symptoms of this condition include:  White, thick, creamy vaginal discharge.  Swelling, itching, redness, and irritation of the vagina. The lips of the vagina (vulva) may be affected as well.  Pain or a burning feeling while urinating.  Pain during sex. How is this diagnosed? This condition is diagnosed based on:  Your medical history.  A physical exam.  A pelvic exam. Your health care provider will examine a sample of your vaginal discharge under a microscope. Your health care provider may send this sample for testing to confirm the diagnosis. How is this treated? This condition is treated with medicine. Medicines may be over-the-counter or prescription. You may be told to use one or more of the following:  Medicine that is taken by mouth (orally).  Medicine that is applied as a cream (topically).  Medicine that is inserted directly into the vagina (suppository). Follow these instructions at home: Lifestyle  Do not have sex until your health care provider approves. Tell your sex partner that you have a yeast infection. That person should go to his or her health care  provider and ask if they should also be treated.  Do not wear tight clothes, such as pantyhose or tight pants.  Wear breathable cotton underwear. General instructions  Take or apply over-the-counter and prescription medicines only as told by your health care provider.  Eat more yogurt. This may help to keep your yeast infection from returning.  Do not use tampons until your health care provider approves.  Try taking a sitz bath to help with discomfort. This is a warm water bath that is taken while you are sitting down. The water should only come up to your hips and should cover your buttocks. Do this 3-4 times per day or as told by your health care provider.  Do not douche.  If you have diabetes, keep your blood sugar levels under control.  Keep all follow-up visits as told by your health care provider. This is important.   Contact a health care provider if:  You have a fever.  Your symptoms go away and then return.  Your symptoms do not get better with treatment.  Your symptoms get worse.  You have new symptoms.  You develop blisters in or around your vagina.  You have blood coming from your vagina and it is not your menstrual period.  You develop pain in your abdomen. Summary  Vaginal yeast infection is a condition that causes discharge as well as soreness, swelling, and redness (inflammation) of the vagina.  This condition is treated with medicine. Medicines may be over-the-counter or prescription.  Take or apply over-the-counter and prescription medicines only as told by your health care provider.  Do not   douche. Do not have sex or use tampons until your health care provider approves.  Contact a health care provider if your symptoms do not get better with treatment or your symptoms go away and then return. This information is not intended to replace advice given to you by your health care provider. Make sure you discuss any questions you have with your health care  provider. Document Revised: 11/15/2018 Document Reviewed: 09/03/2017 Elsevier Patient Education  2021 Elsevier Inc.  

## 2020-08-11 NOTE — Progress Notes (Signed)
Virtual Visit via Video Note  I connected with Cindy Jones  On 08/11/20 at 1:00pm EST by a video enabled telemedicine application and verified that I am speaking with the correct person using two identifiers.   I discussed the limitations of evaluation and management by telemedicine and the availability of in person appointments. The patient expressed understanding and agreed to proceed.  LOCATION: Patient:Home Provider: Home Office  History of Present Illness: Pt was referred to therapy by her psychiatrist at [facility] for anxiety and panic attacks.   Participation Level: Active   Type of Therapy: Virtual Video individual therapy  Treatment Goals addressed: Improve psychiatric symptoms, Controlled Behavior, Moderated Mood, Improve Unhelpful Thought Patterns, Emotional Regulation Skills (Moderate moods, anger management, stress management), Feel and express a full Range of Emotions, Learn about Diagnosis, Healthy Coping Skills.  Interventions: CBT/Supportive/Psychoeducation  Summary: Patient presented for today's session on time and was alert, oriented x5, with no evidence or self-report of SI/HI or A/V H.  Patient reported ongoing compliance with medication.  Clinician inquired about patient's current emotional ratings, as well as any significant changes in thoughts, feelings or behavior since previous session.  Patient reported scores of 1/10 for depression, 6/10 for anxiety, 2/10 for anger/irritability, and denied any reoccurrence of panic attacks. Cln and pt explored her mixed moods which she attributes it to the nonexistent relationship with her mother. Cln explored with pt what to do next about the family relationship. Cln assessed family dynamics and assist patient in determining next steps to re-start family therapy. Cln suggested pt use her mindfulness skills when her anxiety increases.    Assessment and plan: Counselor will continue to meet with patient to address treatment plan goals.  Patient will continue to follow recommendations of providers and implement skills learned in session.  Suicidal/Homicidal: Nowithout intent/plan  Therapist Response: Assessed pt's current functioning and reviewed progress.. Assisted pt processing stressors, negative thoughts.  Assisted pt processing for the management of her stressors.  Participation Level: Active  Diagnosis: Axis I.:  GAD    Follow Up Instructions: I discussed the assessment and treatment plan with the patient. The patient was provided an opportunity to ask questions and all were answered. The patient agreed with the plan and demonstrated an understanding of the instructions.   The patient was advised to call back or seek an in-person evaluation if the symptoms worsen or if the condition fails to improve as anticipated.  I provided 60 minutes of non-face-to-face time during this encounter.   Romond Pipkins S, LCAS

## 2020-08-12 ENCOUNTER — Other Ambulatory Visit (HOSPITAL_COMMUNITY): Payer: Self-pay

## 2020-08-13 LAB — URINALYSIS W MICROSCOPIC + REFLEX CULTURE
Bilirubin Urine: NEGATIVE
Glucose, UA: NEGATIVE
Hyaline Cast: NONE SEEN /LPF
Ketones, ur: NEGATIVE
Nitrites, Initial: NEGATIVE
Protein, ur: NEGATIVE
Specific Gravity, Urine: 1.025 (ref 1.001–1.03)
pH: 5.5 (ref 5.0–8.0)

## 2020-08-13 LAB — URINE CULTURE
MICRO NUMBER:: 11768558
SPECIMEN QUALITY:: ADEQUATE

## 2020-08-13 LAB — CULTURE INDICATED

## 2020-08-14 ENCOUNTER — Other Ambulatory Visit (HOSPITAL_COMMUNITY): Payer: Self-pay

## 2020-08-16 ENCOUNTER — Other Ambulatory Visit (HOSPITAL_COMMUNITY): Payer: Self-pay

## 2020-08-16 MED FILL — Lisdexamfetamine Dimesylate Cap 30 MG: ORAL | 30 days supply | Qty: 30 | Fill #0 | Status: AC

## 2020-08-17 ENCOUNTER — Other Ambulatory Visit (HOSPITAL_COMMUNITY): Payer: Self-pay

## 2020-08-18 ENCOUNTER — Ambulatory Visit (INDEPENDENT_AMBULATORY_CARE_PROVIDER_SITE_OTHER): Payer: No Typology Code available for payment source | Admitting: Licensed Clinical Social Worker

## 2020-08-18 ENCOUNTER — Encounter (HOSPITAL_COMMUNITY): Payer: Self-pay | Admitting: Licensed Clinical Social Worker

## 2020-08-18 ENCOUNTER — Other Ambulatory Visit: Payer: Self-pay

## 2020-08-18 DIAGNOSIS — F411 Generalized anxiety disorder: Secondary | ICD-10-CM

## 2020-08-18 NOTE — Progress Notes (Signed)
Virtual Visit via Video Note  I connected with Cindy Jones  On 08/18/20 at 1:00pm EST by a video enabled telemedicine application and verified that I am speaking with the correct person using two identifiers.   I discussed the limitations of evaluation and management by telemedicine and the availability of in person appointments. The patient expressed understanding and agreed to proceed.  LOCATION: Patient:Home Provider: Home Office  History of Present Illness: Pt was referred to therapy by her psychiatrist at [facility] for anxiety and panic attacks.   Participation Level: Active   Type of Therapy: Virtual Video individual therapy  Treatment Goals addressed: Improve psychiatric symptoms, Controlled Behavior, Moderated Mood, Improve Unhelpful Thought Patterns, Emotional Regulation Skills (Moderate moods, anger management, stress management), Feel and express a full Range of Emotions, Learn about Diagnosis, Healthy Coping Skills.  Interventions: CBT/Supportive/Psychoeducation  Summary: Patient presented for today's session on time and was alert, oriented x5, with no evidence or self-report of SI/HI or A/V H.  Patient reported ongoing compliance with medication.  Clinician inquired about patient's current emotional ratings, as well as any significant changes in thoughts, feelings or behavior since previous session.  Patient reported scores of 1/10 for depression, 6/10 for anxiety, 2/10 for anger/irritability, and denied any reoccurrence of panic attacks. Pt reports on some continued health issues: fatigue, headache, sleeping more. Pt reports her Vyvanse is not effective. Cln reached out to front desk to scheduled an earlier appointment for patient to discuss medication/side effects/symptoms. Patient's husband joined session, to discuss patient's recent moods. Clinician utilized MI OARS to affirm concerns. By the end of session, patient has appointment with psychiatrist, Dr. Michae Kava tomorrow.Again, cln  suggested pt use her mindfulness skills when her anxiety increases.    Assessment and plan: Counselor will continue to meet with patient to address treatment plan goals. Patient will continue to follow recommendations of providers and implement skills learned in session.  Suicidal/Homicidal: Nowithout intent/plan  Therapist Response: Assessed pt's current functioning and reviewed progress.. Assisted pt processing stressors, negative thoughts.  Assisted pt processing for the management of her stressors.  Participation Level: Active  Diagnosis: Axis I.:  GAD    Follow Up Instructions: I discussed the assessment and treatment plan with the patient. The patient was provided an opportunity to ask questions and all were answered. The patient agreed with the plan and demonstrated an understanding of the instructions.   The patient was advised to call back or seek an in-person evaluation if the symptoms worsen or if the condition fails to improve as anticipated.  I provided 45 minutes of non-face-to-face time during this encounter.   Chareese Sergent S, LCAS

## 2020-08-19 ENCOUNTER — Telehealth (INDEPENDENT_AMBULATORY_CARE_PROVIDER_SITE_OTHER): Payer: No Typology Code available for payment source | Admitting: Psychiatry

## 2020-08-19 ENCOUNTER — Other Ambulatory Visit (HOSPITAL_COMMUNITY): Payer: Self-pay

## 2020-08-19 ENCOUNTER — Other Ambulatory Visit: Payer: Self-pay

## 2020-08-19 DIAGNOSIS — F988 Other specified behavioral and emotional disorders with onset usually occurring in childhood and adolescence: Secondary | ICD-10-CM

## 2020-08-19 MED ORDER — AMPHETAMINE-DEXTROAMPHET ER 25 MG PO CP24
25.0000 mg | ORAL_CAPSULE | ORAL | 0 refills | Status: DC
  Filled 2020-08-19: qty 30, 30d supply, fill #0

## 2020-08-19 NOTE — Progress Notes (Signed)
Virtual Visit via Telephone Note  I connected with Cindy Jones on 08/19/20 at  1:45 PM EDT by telephone and verified that I am speaking with the correct person using two identifiers.  Location: Patient: home Provider: office   I discussed the limitations, risks, security and privacy concerns of performing an evaluation and management service by telephone and the availability of in person appointments. I also discussed with the patient that there may be a patient responsible charge related to this service. The patient expressed understanding and agreed to proceed.   History of Present Illness: Cindy Jones shares that her ADHD is not responding to Vyvanse much. Her tics are responding well to the Vyvanse. Her BP is well controlled with Vyvanse. Anxiety is ok. She denies depression. She denies SI/HI.    Observations/Objective:  General Appearance: unable to assess  Eye Contact:  unable to assess  Speech:  Clear and Coherent and Normal Rate  Volume:  Normal  Mood:  Euthymic  Affect:  Full Range  Thought Process:  Goal Directed, Linear and Descriptions of Associations: Intact  Orientation:  Full (Time, Place, and Person)  Thought Content:  Logical  Suicidal Thoughts:  No  Homicidal Thoughts:  No  Memory:  Immediate;   Good  Judgement:  Good  Insight:  Good  Psychomotor Activity: unable to assess  Concentration:  Concentration: Good  Recall:  Good  Fund of Knowledge:  Good  Language:  Good  Akathisia:  unable to assess  Handed:  Right  AIMS (if indicated):     Assets:  Communication Skills Desire for Improvement Financial Resources/Insurance Housing Intimacy Leisure Time Resilience Social Support Talents/Skills Transportation Vocational/Educational  ADL's:  unable to assess  Cognition:  WNL  Sleep:         Assessment and Plan: Depression screen [facility] 2/9 08/19/2020 07/22/2020 06/24/2020 06/07/2020 10/27/2019  Decreased Interest 0 0 0 0 0  Down, Depressed, Hopeless 0 0 0 0 0   PHQ - 2 Score 0 0 0 0 0  Altered sleeping - - - - -  Tired, decreased energy - - - - -  Change in appetite - - - - -  Feeling bad or failure about yourself  - - - - -  Trouble concentrating - - - - -  Moving slowly or fidgety/restless - - - - -  Suicidal thoughts - - - - -  PHQ-9 Score - - - - -  Difficult doing work/chores - - - - -  Some recent data might be hidden    Flowsheet Row Video Visit from 08/19/2020 in [facility] Video Visit from 07/22/2020 in [facility] Video Visit from 06/24/2020 in [facility]  C-SSRS RISK CATEGORY No Risk No Risk No Risk      1. ADD (attention deficit disorder) without hyperactivity - amphetamine-dextroamphetamine (ADDERALL XR) 25 MG 24 hr capsule; Take 1 capsule by mouth every morning.  Dispense: 30 capsule; Refill: 0   - d/c Vyvanse as it is not effective.  Cindy Jones is currently taking her finals for her classes and would like to take Adderall XR for now.She is aware of the risks especially with POTTS. We will explore options for long acting stimulants at our next appointment.   Follow Up Instructions: In 4-6 weeks or sooner if needed   I discussed the assessment and treatment plan with the patient. The patient was provided an opportunity to ask questions and all were answered.  The patient agreed with the plan and demonstrated an understanding of the instructions.   The patient was advised to call back or seek an in-person evaluation if the symptoms worsen or if the condition fails to improve as anticipated.  I provided 12 minutes of non-face-to-face time during this encounter.   Cindy Darter, MD

## 2020-08-24 ENCOUNTER — Encounter: Payer: Self-pay | Admitting: Allergy

## 2020-08-24 ENCOUNTER — Ambulatory Visit: Payer: No Typology Code available for payment source | Admitting: Allergy

## 2020-08-24 ENCOUNTER — Other Ambulatory Visit (HOSPITAL_COMMUNITY): Payer: Self-pay

## 2020-08-24 ENCOUNTER — Other Ambulatory Visit: Payer: Self-pay

## 2020-08-24 VITALS — BP 108/72 | HR 81 | Temp 98.0°F | Resp 12 | Ht 66.0 in | Wt 182.0 lb

## 2020-08-24 DIAGNOSIS — T781XXD Other adverse food reactions, not elsewhere classified, subsequent encounter: Secondary | ICD-10-CM | POA: Insufficient documentation

## 2020-08-24 DIAGNOSIS — J3089 Other allergic rhinitis: Secondary | ICD-10-CM | POA: Diagnosis not present

## 2020-08-24 DIAGNOSIS — L509 Urticaria, unspecified: Secondary | ICD-10-CM | POA: Diagnosis not present

## 2020-08-24 DIAGNOSIS — Z8709 Personal history of other diseases of the respiratory system: Secondary | ICD-10-CM | POA: Insufficient documentation

## 2020-08-24 MED ORDER — CETIRIZINE HCL 10 MG PO TABS
10.0000 mg | ORAL_TABLET | Freq: Two times a day (BID) | ORAL | 3 refills | Status: DC
  Filled 2020-08-24: qty 60, 30d supply, fill #0

## 2020-08-24 MED ORDER — FAMOTIDINE 20 MG PO TABS
20.0000 mg | ORAL_TABLET | Freq: Two times a day (BID) | ORAL | 3 refills | Status: DC
  Filled 2020-08-24: qty 60, 30d supply, fill #0

## 2020-08-24 NOTE — Assessment & Plan Note (Signed)
Pruritic rash for 3 years now occurring a few times per week. Concerned about food allergies. She paid $900+ to do some type of food testing which showed multiple positives - avoiding those foods did not help. Taking zyrtec/benadryl with some benefit. 1 new dog at home around onset of symptoms.   Today's skin testing showed: Positive to dust mites, cat, mold. Borderline to walnuts, hops, dog.  . Based on clinical history, she likely has chronic idiopathic urticaria. Discussed with patient, that urticaria is usually caused by release of histamine by cutaneous mast cells but sometimes it is non-histamine mediated. Explained that urticaria is not always associated with allergies. . In most cases, the exact etiology for urticaria can not be established and it is considered idiopathic. [name] Meanwhile start the following medications: o Zyrtec 10mg  twice a day. o Famotidine 20mg  twice a day. o If hives are not controlled or causes drowsiness let know.  Avoid the following potential triggers: alcohol, tight clothing, NSAIDs, hot showers and getting overheated. . Get bloodwork to rule out other etiologies.

## 2020-08-24 NOTE — Patient Instructions (Addendum)
Today's skin testing showed: Positive to dust mites, cat, mold. Borderline to walnuts, hops, dog.  Results given.  Rash/hives: [name] Based on clinical history, she likely has chronic idiopathic urticaria. Discussed with patient, that urticaria is usually caused by release of histamine by cutaneous mast cells but sometimes it is non-histamine mediated. Explained that urticaria is not always associated with allergies. . In most cases, the exact etiology for urticaria can not be established and it is considered idiopathic.  [name] Meanwhile start the following medications: o Zyrtec 10mg  twice a day. o Famotidine 20mg  twice a day. o If hives are not controlled or causes drowsiness let know.  Avoid the following potential triggers: alcohol, tight clothing, NSAIDs, hot showers and getting overheated. . Get bloodwork:  o We are ordering labs, so please allow 1-2 weeks for the results to come back. o With the newly implemented Cures Act, the labs might be visible to you at the same time that they become visible to me. However, I will not address the results until all of the results are back, so please be patient.   Food:  Discussed with patient at length that skin prick testing and bloodwork (food IgE levels) check for IgE mediated reactions which her clinical presentation does not support.   Keep a food journal with symptoms and foods eaten.  See if you notice worsening symptoms with walnuts or hops.  You may try to reintroduce foods back into your diet one by one slowly.  If you notice worsening symptoms then continue to avoid and let know.  You can send the results of your other food testing via myhcart.   Environmental allergies  Start environmental control measures as below.  The above antihistamines should also help these symptoms.  Breathing:  May use albuterol rescue inhaler 2 puffs every 4 to 6 hours as needed for shortness of breath, chest tightness, coughing, and wheezing.  May use albuterol rescue inhaler 2 puffs 5 to 15 minutes prior to strenuous physical activities. Monitor frequency of use.   Follow up in 1 months or sooner if needed.   Control of House Dust Mite Allergen . Dust mite allergens are a common trigger of allergy and asthma symptoms. While they can be found throughout the house, these microscopic creatures thrive in warm, humid environments such as bedding, upholstered furniture and carpeting. . Because so much time is spent in the bedroom, it is essential to reduce mite levels there.  . Encase pillows, mattresses, and box springs in special allergen-proof fabric covers or airtight, zippered plastic covers.  . Bedding should be washed weekly in hot water (130 F) and dried in a hot dryer. Allergen-proof covers are available for comforters and pillows that can't be regularly washed.  [place] the allergy-proof covers every few months. Minimize clutter in the bedroom. Keep pets out of the bedroom.  [place] Keep humidity less than 50% by using a dehumidifier or air conditioning. You can buy a humidity measuring device called a hygrometer to monitor this.  . If possible, replace carpets with hardwood, linoleum, or washable area rugs. If that's not possible, vacuum frequently with a vacuum that has a HEPA filter. . Remove all upholstered furniture and non-washable window drapes from the bedroom. . Remove all non-washable stuffed toys from the bedroom.  Wash stuffed toys weekly. Pet Allergen Avoidance: . Contrary to popular opinion, there are no "hypoallergenic" breeds of dogs or cats. That is because people are not allergic to an animal's hair, but  to an allergen found in the animal's saliva, dander (dead skin flakes) or urine. Pet allergy symptoms typically occur within minutes. For some people, symptoms can build up and become most severe 8 to 12 hours after contact with the animal. People with severe allergies can experience reactions in public places if dander has  been transported on the pet owners' clothing. [name] Keeping an animal outdoors is only a partial solution, since homes with pets in the yard still have higher concentrations of animal allergens. . Before getting a pet, ask your allergist to determine if you are allergic to animals. If your pet is already considered part of your family, try to minimize contact and keep the pet out of the bedroom and other rooms where you spend a great deal of time. . As with dust mites, vacuum carpets often or replace carpet with a hardwood floor, tile or linoleum. . High-efficiency particulate air (HEPA) cleaners can reduce allergen levels over time. . While dander and saliva are the source of cat and dog allergens, urine is the source of allergens from rabbits, hamsters, mice and [place] pigs; so ask a non-allergic family member to clean the animal's cage. . If you have a pet allergy, talk to your allergist about the potential for allergy immunotherapy (allergy shots). This strategy can often provide long-term relief. Mold Control . Mold and fungi can grow on a variety of surfaces provided certain temperature and moisture conditions exist.  . Outdoor molds grow on plants, decaying vegetation and soil. The major outdoor mold, Alternaria and Cladosporium, are found in very high numbers during hot and dry conditions. Generally, a late summer - fall peak is seen for common outdoor fungal spores. Rain will temporarily lower outdoor mold spore count, but counts rise rapidly when the rainy period ends. . The most important indoor molds are Aspergillus and Penicillium. Dark, humid and poorly ventilated basements are ideal sites for mold growth. The next most common sites of mold growth are the bathroom and the kitchen. Outdoor (Seasonal) Mold Control . Use air conditioning and keep windows closed. . Avoid exposure to decaying vegetation. [name] Avoid leaf raking. . Avoid grain handling. . Consider wearing a face mask if working in  moldy areas.  Indoor (Perennial) Mold Control  . Maintain humidity below 50%. . Get rid of mold growth on hard surfaces with water, detergent and, if necessary, 5% bleach (do not mix with other cleaners). Then dry the area completely. If mold covers an area more than 10 square feet, consider hiring an indoor environmental professional. . For clothing, washing with soap and water is best. If moldy items cannot be cleaned and dried, throw them away. . Remove sources e.g. contaminated carpets. . Repair and seal leaking roofs or pipes. Using dehumidifiers in damp basements may be helpful, but empty the water and clean units regularly to prevent mildew from forming. All rooms, especially basements, bathrooms and kitchens, require ventilation and cleaning to deter mold and mildew growth. Avoid carpeting on concrete or damp floors, and storing items in damp areas.

## 2020-08-24 NOTE — Assessment & Plan Note (Signed)
Avoiding some foods due to concerned about sensitivity after she did some food testing via bloodwork.   Today's skin testing was borderline positive to walnuts and hops - which she does not consume regularly.   Discussed with patient at length that skin prick testing and bloodwork (food IgE levels) check for IgE mediated reactions which her clinical presentation does not support.   Keep a food journal with symptoms and foods eaten.  See if you notice worsening symptoms with walnuts or hops.  You may try to reintroduce foods back into your diet one by one slowly.  If you notice worsening symptoms then continue to avoid and let us know.  You can send the results of your other food testing via [facility]cart.

## 2020-08-24 NOTE — Assessment & Plan Note (Signed)
Asthma as a child which was quiescent until recently when noting some dyspnea on exertion mainly. Tried albuterol with unknown benefit.  Today's spirometry showed: normal pattern with no significant improvement in FEV1 post bronchodilator treatment. Clinically feeling unchanged.   May use albuterol rescue inhaler 2 puffs every 4 to 6 hours as needed for shortness of breath, chest tightness, coughing, and wheezing. May use albuterol rescue inhaler 2 puffs 5 to 15 minutes prior to strenuous physical activities. Monitor frequency of use.

## 2020-08-24 NOTE — Assessment & Plan Note (Signed)
Itchy eyes and headaches around the spring. No prior allergy testing.  Today's skin testing was Positive to dust mites, cat, mold. Borderline to dog.   Start environmental control measures as below.  The above antihistamines should also help these symptoms.

## 2020-08-24 NOTE — Progress Notes (Signed)
New Patient Note  RE: Cindy Jones MRN: [id number] DOB: 31-Dec-1990 Date of Office Visit: 08/24/2020  Consult requested by: Natalia Leatherwood, DO Primary care provider: Natalia Leatherwood, DO  Chief Complaint: Allergic Reaction (Hives and bloating and weight gain with unknown foods started in 2018 after gallbladder surgery ), Asthma (Childhood asthma that has returned ), and Allergies (She gets yeasts infections after eating sugar and has diflucan on hand )  History of Present Illness: I had the pleasure of seeing Cindy Jones for initial evaluation at the [facility] on 08/24/2020. She is a 30 y.o. female, who is referred here by Felix Pacini A, DO for the evaluation of hives.  Rash: Rash started around 2019. Initially it only occurred sporadically and then noticed that it would happen after she was eating.  Initially it started to occur on her hands and it seems to be spreading.   Describes them as red, raised, pruritic. Individual rashes lasts about a few hours. No ecchymosis upon resolution. Associated symptoms include: bloating, headaches, constipation. Suspected triggers are unknown but concerned about foods especially gluten, dairy and sugar.   She does break out a few times per week.  Denies any fevers, chills, changes in medications, foods, personal care products or recent infections. Patient was on keto diet and stopped as she thought it was causing the hives/headaches but it didn't help.  Patient had cholecystectomy in November 2018. No prior Covid-19 diagnosis.   She has tried the following therapies: benadryl, zyrtec 10mg  with some benefit. Systemic steroids no. Currently on zyrtec 10mg  daily.  Previous work up includes: 2022 CBC diff (lower WBC), TSH, CMP.  Previous history of rash/hives: rash with latex gloves in high school. Patient is up to date with the following cancer screening tests: pap smears, physical exam.  Dietary History: patient has been  eating other foods including peanut, treenuts, sesame, shellfish, fish, soy, limited wheat, meats, fruits and vegetables. Currently avoiding dairy, egg, almonds, beef due to alcat testing she did - paid $900+ for this test but avoiding these foods did not help.   Patient does have migraines and is following with neurologist.   Asthma: She reports symptoms of shortness of breath with exertion mainly. Patient had asthma as a child which was quiescent until 1 year ago. Current medications include albuterol prn with unknown benefit. She reports not using aerochamber with inhalers. She tried the following inhalers: unsure. Main triggers are exercise. In the last month, frequency of symptoms: depends. Frequency of nocturnal symptoms: depends. Frequency of SABA use: 1x/week. Interference with physical activity: yes. Sleep is undisturbed. In the last 12 months, emergency room visits/urgent care visits/doctor office visits or hospitalizations due to respiratory issues: no. In the last 12 months, oral steroids courses: no. Lifetime history of hospitalization for respiratory issues: no. Prior intubations: no.  History of pneumonia: no. She was not evaluated by allergist/pulmonologist in the past. Smoking exposure: no. Up to date with flu vaccine: yes. Up to date with COVID-19 vaccine: yes. Prior Covid-19 infection: no. History of reflux: depends on what she is eating.  Assessment and Plan: [name] is a 30 y.o. female with: Urticaria Pruritic rash for 3 years now occurring a few times per week. Concerned about food allergies. She paid $900+ to do some type of food testing which showed multiple positives - avoiding those foods did not help. Taking zyrtec/benadryl with some benefit. 1 new dog at home around onset of symptoms.   Today's skin  testing showed: Positive to dust mites, cat, mold. Borderline to walnuts, hops, dog.  . Based on clinical history, she likely has chronic idiopathic urticaria. Discussed with  patient, that urticaria is usually caused by release of histamine by cutaneous mast cells but sometimes it is non-histamine mediated. Explained that urticaria is not always associated with allergies. . In most cases, the exact etiology for urticaria can not be established and it is considered idiopathic. [name] Meanwhile start the following medications: o Zyrtec  twice a day. o Famotidine  twice a day. o If hives are not controlled or causes drowsiness let us know.  Avoid the following potential triggers: alcohol, tight clothing, NSAIDs, hot showers and getting overheated. . Get bloodwork to rule out other etiologies.  Other adverse food reactions, not elsewhere classified, subsequent encounter Avoiding some foods due to concerned about sensitivity after she did some food testing via bloodwork.   Today's skin testing was borderline positive to walnuts and hops - which she does not consume regularly.   Discussed with patient at length that skin prick testing and bloodwork (food IgE levels) check for IgE mediated reactions which her clinical presentation does not support.   Keep a food journal with symptoms and foods eaten.  See if you notice worsening symptoms with walnuts or hops.  You may try to reintroduce foods back into your diet one by one slowly.  If you notice worsening symptoms then continue to avoid and let us know.  You can send the results of your other food testing via myhcart.   Other allergic rhinitis Itchy eyes and headaches around the spring. No prior allergy testing.  Today's skin testing was Positive to dust mites, cat, mold. Borderline to dog.   Start environmental control measures as below.  The above antihistamines should also help these symptoms.  History of asthma Asthma as a child which was quiescent until recently when noting some dyspnea on exertion mainly. Tried albuterol with unknown benefit.  Today's spirometry showed: normal pattern with no  significant improvement in FEV1 post bronchodilator treatment. Clinically feeling unchanged.   May use albuterol rescue inhaler 2 puffs every 4 to 6 hours as needed for shortness of breath, chest tightness, coughing, and wheezing. May use albuterol rescue inhaler 2 puffs 5 to 15 minutes prior to strenuous physical activities. Monitor frequency of use.    Return in about 4 weeks (around 09/21/2020).  Meds ordered this encounter  Medications  . cetirizine (ZYRTEC ALLERGY) 10 MG tablet    Sig: Take 1 tablet (10 mg total) by mouth 2 (two) times daily.    Dispense:  60 tablet    Refill:  3  . famotidine (PEPCID) 20 MG tablet    Sig: Take 1 tablet (20 mg total) by mouth 2 (two) times daily.    Dispense:  60 tablet    Refill:  3    Lab Orders     CBC with Differential/Platelet     Alpha-Gal Panel     ANA w/Reflex     Chronic Urticaria     C-reactive protein     Tryptase     Sedimentation rate  Other allergy screening: Rhino conjunctivitis: yes  Some sinus headaches and itchy eyes usually in the spring. No prior work up.  Medication allergy: yes Hymenoptera allergy: no History of recurrent infections suggestive of immunodeficency: no  Diagnostics: Spirometry:  Tracings reviewed. Her effort: Good reproducible efforts. FVC: 3.81L FEV1: 3.17L, 92% predicted FEV1/FVC ratio: 83% Interpretation: Spirometry consistent with normal  pattern with no significant improvement in FEV1 post bronchodilator treatment. Clinically feeling unchanged.   Please see scanned spirometry results for details.  Skin Testing: Environmental allergy panel and food panel Positive to dust mites, cat, mold. Borderline to walnuts, hops, dog.  Results discussed with patient/family.  Airborne Adult Perc - 08/24/20 0948    Time Antigen Placed 0948    Allergen Manufacturer Waynette ButteryGreer    Location Back    Number of Test 59    1. Control-Buffer 50% Glycerol Negative    2. Control-Histamine 1 mg/ml 2+    3. Albumin  saline Negative    4. Bahia Negative    5. French Southern Territories[place]ermuda Negative    6. Johnson Negative    7. [place] Blue Negative    8. Meadow Fescue Negative    9. Perennial Rye Negative    10. Sweet Vernal Negative    11. Timothy Negative    12. Cocklebur Negative    13. Burweed Marshelder Negative    14. Ragweed, short Negative    15. Ragweed, Giant Negative    16. Plantain,  English Negative    17. Lamb's Quarters Negative    18. Sheep Sorrell Negative    19. Rough Pigweed Negative    20. Marsh Elder, Rough Negative    21. Mugwort, Common Negative    22. Ash mix Negative    23. Birch mix Negative    24. Beech American Negative    25. Box, Elder Negative    26. Cedar, red Negative    27. Cottonwood, Guinea-BissauEastern Negative    28. Elm mix Negative    29. Hickory Negative    30. Maple mix Negative    31. Oak, Guinea-Bissau[place]astern mix Negative    32. Pecan Pollen Negative    33. Pine mix Negative    34. Sycamore Eastern Negative    35. Walnut, Black Pollen Negative    36. Alternaria alternata Negative    37. Cladosporium Herbarum Negative    38. Aspergillus mix Negative    39. Penicillium mix Negative    40. Bipolaris sorokiniana (Helminthosporium) Negative    41. Drechslera spicifera (Curvularia) Negative    42. Mucor plumbeus Negative    43. Fusarium moniliforme 2+    44. Aureobasidium pullulans (pullulara) Negative    45. Rhizopus oryzae Negative    46. Botrytis cinera 2+    47. Epicoccum nigrum --   +/-   48. Phoma betae Negative    49. Candida Albicans Negative    50. Trichophyton mentagrophytes 2+    51. Mite, D Farinae  5,000 AU/ml 2+    52. Mite, D Pteronyssinus  5,000 AU/ml 3+    53. Cat Hair 10,000 BAU/ml 3+    54.  Dog Epithelia --   +/-   55. Mixed Feathers Negative    56. Horse Epithelia Negative    57. Cockroach, German Negative    58. Mouse Negative    59. Tobacco Leaf Negative          Food Adult Perc - 08/24/20 0900    Time Antigen Placed [id number]    Allergen Manufacturer Waynette ButteryGreer     Location Back    Number of allergen test 72    1. Peanut Negative    2. Soybean Negative    3. Wheat Negative    4. Sesame Negative    5. Milk, cow Negative    6. Egg White, Chicken Negative    7. Casein Negative  8. Shellfish Mix Negative    9. Fish Mix Negative    10. Cashew Negative    11. Pecan Food Negative    12. Walnut Food --   +/-   13. Almond Negative    14. Hazelnut Negative    15. [place] nut Negative    16. Coconut Negative    17. Pistachio Negative    18. Catfish Negative    19. Bass Negative    20. Trout Negative    21. Tuna Negative    22. Salmon Negative    23. Flounder Negative    24. Codfish Negative    25. Shrimp Negative    26. Crab Negative    27. Lobster Negative    28. Oyster Negative    29. Scallops Negative    30. Barley Negative    31. Oat  Negative    32. Rye  Negative    33. Hops --   +/-   34. Rice Negative    35. Cottonseed Negative    36. Saccharomyces Cerevisiae  Negative    37. Pork Negative    38. Malawi Meat Negative    39. Chicken Meat Negative    40. Beef Negative    41. Lamb Negative    42. Tomato Negative    43. White Potato Negative    44. Sweet Potato Negative    45. Pea, Green/English Negative    46. Navy Bean Negative    47. Mushrooms Negative    48. Avocado Negative    49. Onion Negative    50. Cabbage Negative    51. Carrots Negative    52. Celery Negative    53. Corn Negative    54. Cucumber Negative    55. Grape (White seedless) Negative    56. Orange  Negative    57. Banana Negative    58. Apple Negative    59. Peach Negative    60. Strawberry Negative    61. Cantaloupe Negative    62. Watermelon Negative    63. Pineapple Negative    64. Chocolate/Cacao bean Negative    65. Karaya Gum Negative    66. Acacia (Arabic Gum) Negative    67. Cinnamon Negative    68. Nutmeg Negative    69. Ginger Negative    70. Garlic Negative    71. Pepper, black Negative    72. Mustard Negative            Past Medical History: Patient Active Problem List   Diagnosis Date Noted  . Urticaria 08/24/2020  . Other adverse food reactions, not elsewhere classified, subsequent encounter 08/24/2020  . Other allergic rhinitis 08/24/2020  . History of asthma 08/24/2020  . B12 deficiency 07/01/2020  . Exercise-induced asthma 10/27/2019  . Encounter for preventive care 10/27/2019  . Friable cervix 06/09/2019  . History of acute PID 06/09/2019  . GAD (generalized anxiety disorder) 11/07/2018  . Overweight (BMI 25.0-29.9) 06/03/2018  . Bulimia 04/20/2015  . ADD (attention deficit disorder) 01/15/2014  . Tourette disease 10/18/2013   Past Medical History:  Diagnosis Date  . Anxiety   . Asthma    as a child  . Binge eating disorder   . Dysrhythmia    " I feel it about once a month"  . Frequent headaches   . GAD (generalized anxiety disorder)   . GERD (gastroesophageal reflux disease)   . Major depression in complete remission (HCC)   . Migraines   . PID (acute  pelvic inflammatory disease)   . Seasonal allergies   . Tourette disorder   . Urinary tract bacterial infections    Past Surgical History: Past Surgical History:  Procedure Laterality Date  . CHOLECYSTECTOMY    . ESOPHAGOGASTRODUODENOSCOPY    . LAPAROSCOPIC CHOLECYSTECTOMY SINGLE SITE WITH INTRAOPERATIVE CHOLANGIOGRAM N/A 03/15/2016   Procedure: LAPAROSCOPIC CHOLECYSTECTOMY SINGLE SITE WITH INTRAOPERATIVE CHOLANGIOGRAM;  Surgeon: Karie Soda, MD;  Location: [facility] OR;  Service: General;  Laterality: N/A;  . TONSILLECTOMY AND ADENOIDECTOMY  2013  . WISDOM TOOTH EXTRACTION     Medication List:  Current Outpatient Medications  Medication Sig Dispense Refill  . albuterol (VENTOLIN HFA) 108 (90 Base) MCG/ACT inhaler 1-2 puffs 15 minutes prior to exercise. 18 g 5  . amphetamine-dextroamphetamine (ADDERALL XR) 25 MG 24 hr capsule Take 1 capsule by mouth every morning. 30 capsule 0  . aspirin-acetaminophen-caffeine (EXCEDRIN  MIGRAINE) 250-250-65 MG per tablet Take 1-2 tablets by mouth every 6 (six) hours as needed for headache.     . cetirizine (ZYRTEC ALLERGY) 10 MG tablet Take 1 tablet (10 mg total) by mouth 2 (two) times daily. 60 tablet 3  . Cyanocobalamin (VITAMIN B-12) 1000 MCG SUBL Place under the tongue.    . famotidine (PEPCID) 20 MG tablet Take 1 tablet (20 mg total) by mouth 2 (two) times daily. 60 tablet 3  . fluconazole (DIFLUCAN) 150 MG tablet Take 1 tablet (150 mg total) by mouth once a week. 3 tablet 0  . ondansetron (ZOFRAN ODT) 4 MG disintegrating tablet Dissolve 1 tablet (4 mg total) by mouth every 8 (eight) hours as needed for nausea or vomiting. 20 tablet 5  . rizatriptan (MAXALT-MLT) 10 MG disintegrating tablet Dissolve 1 tablet by mouth at earliest onset of migraine.  May repeat in 2 hours if needed.  Maximum of 2 tablets in 24 hours. (Patient taking differently: Dissolve 1 tablet by mouth at earliest onset of migraine.  May repeat in 2 hours if needed.  Maximum of 2 tablets in 24 hours.) 10 tablet 5  . VITAMIN D, CHOLECALCIFEROL, PO Take by mouth.    . lisdexamfetamine (VYVANSE) 20 MG capsule TAKE 1 CAPSULE BY MOUTH DAILY. (Patient not taking: Reported on 08/24/2020) 30 capsule 0  . lisdexamfetamine (VYVANSE) 30 MG capsule TAKE 1 CAPSULE BY MOUTH DAILY. MAY FILL. MAY FILL 09/10/20 (Patient not taking: Reported on 08/24/2020) 30 capsule 0  . lisdexamfetamine (VYVANSE) 30 MG capsule TAKE 1 CAPSULE BY MOUTH DAILY. MAY FILL 08/16/20 (Patient not taking: Reported on 08/24/2020) 30 capsule 0   No current facility-administered medications for this visit.   Allergies: Allergies  Allergen Reactions  . Latex Swelling and Rash  . Cranberry Juice Powder Other (See Comments)    Throat pain.  . Naproxen Other (See Comments)    "Liver starts hurting" Diarrhea  . Nickel     Skin peeling  . Sulfa Antibiotics Hives and Swelling   Social History: Social History   Socioeconomic History  . Marital status:  Married    Spouse name: Not on file  . Number of children: 0  . Years of education: Not on file  . Highest education level: Not on file  Occupational History  . Occupation: Systems developer porfolio  Tobacco Use  . Smoking status: Never Smoker  . Smokeless tobacco: Never Used  Vaping Use  . Vaping Use: Never used  Substance and Sexual Activity  . Alcohol use: Yes    Alcohol/week: 1.0 standard drink    Types: 1 Glasses of wine  per week    Comment: occ  . Drug use: No  . Sexual activity: Yes    Partners: Male    Birth control/protection: None, Condom    Comment: Married  Other Topics Concern  . Not on file  Social History Narrative   - Married, no children.   Psychiatrist education.   - She works FT as a Patent attorney.   - Her aunt & uncle were her guardians when she was in HS. They live in [place], [place].    - She has a fraternal twin sister.    - Wears her seatbelt, smoke detectors in the home.   Right handed   Social Determinants of Health   Financial Resource Strain: Not on file  Food Insecurity: Not on file  Transportation Needs: Not on file  Physical Activity: Not on file  Stress: Not on file  Social Connections: Not on file   Lives in a house [place] in [place]. Smoking: denies Occupation: Manufacturing systems engineer HistorySurveyor, minerals in the house: no Carpet in the family room: no Carpet in the bedroom: no Heating: gas Cooling: central Pet: yes 1 dog x 4 yrs  Family History: Family History  Problem Relation Age of Onset  . Alcohol abuse Mother   . Bipolar disorder Mother   . Cancer Father        sarcoma; passed away when pt was 8  . Tourette syndrome Maternal Aunt   . Alcohol abuse Maternal Aunt   . Hyperlipidemia Paternal Aunt   . Hashimoto's thyroiditis Paternal Aunt   . Tourette syndrome Maternal Grandfather   . Heart disease Paternal Grandfather   . Asthma Sister   . Allergic rhinitis Sister   . Eczema Sister   . Alcohol abuse Maternal Uncle   .  Emphysema Maternal Grandmother   . Emphysema Paternal Grandmother   . Suicidality Neg Hx   . Urticaria Neg Hx   . Angioedema Neg Hx    Review of Systems  Constitutional: Negative for appetite change, chills, fever and unexpected weight change.  HENT: Negative for congestion and rhinorrhea.   Eyes: Negative for itching.  Respiratory: Positive for shortness of breath. Negative for cough, chest tightness and wheezing.   Cardiovascular: Negative for chest pain.  Gastrointestinal: Negative for abdominal pain.  Genitourinary: Negative for difficulty urinating.  Skin: Positive for rash.  Allergic/Immunologic: Positive for environmental allergies.  Neurological: Positive for headaches.   Objective: BP 108/72   Pulse 81   Temp 98 F (36.7 C) (Temporal)   Resp 12   Ht 5\' 6"  (1.676 m)   Wt 182 lb (82.6 kg)   LMP 08/09/2020   SpO2 98%   BMI 29.38 kg/m  Body mass index is 29.38 kg/m. Physical Exam Vitals and nursing note reviewed.  Constitutional:      Appearance: Normal appearance. She is well-developed.  HENT:     Head: Normocephalic and atraumatic.     Right Ear: Tympanic membrane and external ear normal.     Left Ear: Tympanic membrane and external ear normal.     Nose: Nose normal.     Mouth/Throat:     Mouth: Mucous membranes are moist.     Pharynx: Oropharynx is clear.  Eyes:     Conjunctiva/sclera: Conjunctivae normal.  Cardiovascular:     Rate and Rhythm: Normal rate and regular rhythm.     Heart sounds: Normal heart sounds. No murmur heard. No friction rub. No gallop.   Pulmonary:  Effort: Pulmonary effort is normal.     Breath sounds: Normal breath sounds. No wheezing, rhonchi or rales.  Musculoskeletal:     Cervical back: Neck supple.  Skin:    General: Skin is warm.     Findings: No rash.  Neurological:     Mental Status: She is alert and oriented to person, place, and time.  Psychiatric:        Behavior: Behavior normal.    The plan was reviewed  with the patient/family, and all questions/concerned were addressed.  It was my pleasure to see Charmon today and participate in her care. Please feel free to contact me with any questions or concerns.  Sincerely,  Wyline Mood, DO Allergy & Immunology  [facility] office: [phone] [facility] office: [phone]

## 2020-08-25 ENCOUNTER — Encounter (HOSPITAL_COMMUNITY): Payer: Self-pay | Admitting: Licensed Clinical Social Worker

## 2020-08-25 ENCOUNTER — Other Ambulatory Visit: Payer: Self-pay

## 2020-08-25 ENCOUNTER — Ambulatory Visit (INDEPENDENT_AMBULATORY_CARE_PROVIDER_SITE_OTHER): Payer: No Typology Code available for payment source | Admitting: Licensed Clinical Social Worker

## 2020-08-25 DIAGNOSIS — F411 Generalized anxiety disorder: Secondary | ICD-10-CM | POA: Diagnosis not present

## 2020-08-25 NOTE — Progress Notes (Addendum)
Virtual Visit via Video Note  I connected with Cindy Jones  On 08/25/20 at 1:00pm EST by a video enabled telemedicine application and verified that I am speaking with the correct person using two identifiers.   I discussed the limitations of evaluation and management by telemedicine and the availability of in person appointments. The patient expressed understanding and agreed to proceed.  LOCATION: Patient:Home Provider: Home Office  History of Present Illness: Pt was referred to therapy by her psychiatrist at [facility] for anxiety and panic attacks.   Participation Level: Active   Type of Therapy: Virtual Video individual therapy  Treatment Goals addressed: Improve psychiatric symptoms, Controlled Behavior, Moderated Mood, Improve Unhelpful Thought Patterns, Emotional Regulation Skills (Moderate moods, anger management, stress management), Feel and express a full Range of Emotions, Learn about Diagnosis, Healthy Coping Skills.  Interventions: CBT/Supportive/Psychoeducation  Summary: Patient presented for today's session on time and was alert, oriented x5, with no evidence or self-report of SI/HI or A/V H.  Patient reported ongoing compliance with medication.  Clinician inquired about patient's current emotional ratings, as well as any significant changes in thoughts, feelings or behavior since previous session.  Patient reported scores of 1/10 for depression, 6/10 for anxiety, 2/10 for anger/irritability, and denied any reoccurrence of panic attacks. Pt reports on some continued health issues: fatigue, headache, sleeping more, cognitive deficits, word search, stuttering. Cln suggested she reach out to her providers to discuss her symptoms. Cln suggested increased stress may be causing some of the symptoms. Cln provided psychoeducation on stress management.  Pt met with her psychiatrist, dr agarwal to discuss her Vyvanse. Cln completed Nutrition, GAD7 and pain assessments.    Assessment and plan:  Counselor will continue to meet with patient to address treatment plan goals. Patient will continue to follow recommendations of providers and implement skills learned in session.  Suicidal/Homicidal: Nowithout intent/plan  Therapist Response: Assessed pt's current functioning and reviewed progress.. Assisted pt processing stressors, negative thoughts.  Assisted pt processing for the management of her stressors.  Participation Level: Active  Diagnosis: Axis I.:  GAD    Follow Up Instructions: I discussed the assessment and treatment plan with the patient. The patient was provided an opportunity to ask questions and all were answered. The patient agreed with the plan and demonstrated an understanding of the instructions.   The patient was advised to call back or seek an in-person evaluation if the symptoms worsen or if the condition fails to improve as anticipated.  I provided 60 minutes of non-face-to-face time during this encounter.   Johnmatthew Solorio S, LCAS

## 2020-08-25 NOTE — Progress Notes (Signed)
Cardiology Office Note:   Date:  08/26/2020  NAME:  Cindy Jones    MRN: [id number] DOB:  1990-10-03   PCP:  Natalia LeatherwoodKuneff, Renee A, DO  Cardiologist:  No primary care provider on file.  Electrophysiologist:  None   Referring MD: Natalia LeatherwoodKuneff, Renee A, DO   Chief Complaint  Patient presents with  . Follow-up    History of Present Illness:   Cindy Jones is a 30 y.o. female with a hx of migraines, anxiety who presents for follow-up of syncope. Seen in February for recurrent syncope. Found to be B12 deficient. Seen 06/24/2020 for recurrent syncope. Episodes occurred in the morning. Lightheaded and dizzy before episodes.  She reports that her symptoms have improved.  She is had no further passing out episodes.  She has had rapid heartbeat sensation for years upon standing.  Her monitor showed no arrhythmias.  She is now drinking 80 to 100 ounces of water daily.  She is out exercising on a Peloton bike.  She can complete a high level of activity.  She reports that she did notice significant improvement in her symptoms with B12 injections.  Symptoms have come back with moving to last dose of B12 supplementation.  She will continue to work with her primary care doctor on the etiology of this.  Really unclear why she is so B12 deficient in my opinion.  Although her levels are lower limit of normal she is deficient in my opinion.  She denies any chest pain or significant shortness of breath.  Symptoms have improved with hydration as well as B12 supplementation.  It is reassuring that her echo and monitor were normal.  Past Medical History: Past Medical History:  Diagnosis Date  . Anxiety   . Asthma    as a child  . Binge eating disorder   . Dysrhythmia    " I feel it about once a month"  . Frequent headaches   . GAD (generalized anxiety disorder)   . GERD (gastroesophageal reflux disease)   . Major depression in complete remission (HCC)   . Migraines   . PID (acute pelvic inflammatory disease)   .  Seasonal allergies   . Tourette disorder   . Urinary tract bacterial infections     Past Surgical History: Past Surgical History:  Procedure Laterality Date  . CHOLECYSTECTOMY    . ESOPHAGOGASTRODUODENOSCOPY    . LAPAROSCOPIC CHOLECYSTECTOMY SINGLE SITE WITH INTRAOPERATIVE CHOLANGIOGRAM N/A 03/15/2016   Procedure: LAPAROSCOPIC CHOLECYSTECTOMY SINGLE SITE WITH INTRAOPERATIVE CHOLANGIOGRAM;  Surgeon: Karie SodaSteven Gross, MD;  [facility];  Service: General;  Laterality: N/A;  . TONSILLECTOMY AND ADENOIDECTOMY  2013  . WISDOM TOOTH EXTRACTION      Current Medications: Current Meds  Medication Sig  . albuterol (VENTOLIN HFA) 108 (90 Base) MCG/ACT inhaler 1-2 puffs 15 minutes prior to exercise.  [name]. amphetamine-dextroamphetamine (ADDERALL XR) 25 MG 24 hr capsule Take 1 capsule by mouth every morning.  [name]. aspirin-acetaminophen-caffeine (EXCEDRIN MIGRAINE) 250-250-65 MG per tablet Take 1-2 tablets by mouth every 6 (six) hours as needed for headache.   . cetirizine (ZYRTEC ALLERGY) 10 MG tablet Take 1 tablet (10 mg total) by mouth 2 (two) times daily.  . Cyanocobalamin (VITAMIN B-12) 1000 MCG SUBL Place under the tongue.  . famotidine (PEPCID) 20 MG tablet Take 1 tablet (20 mg total) by mouth 2 (two) times daily.  . fluconazole (DIFLUCAN) 150 MG tablet Take 1 tablet (150 mg total) by mouth once a week.  . ondansetron (ZOFRAN ODT)  4 MG disintegrating tablet Dissolve 1 tablet (4 mg total) by mouth every 8 (eight) hours as needed for nausea or vomiting.  . rizatriptan (MAXALT-MLT) 10 MG disintegrating tablet Dissolve 1 tablet by mouth at earliest onset of migraine.  May repeat in 2 hours if needed.  Maximum of 2 tablets in 24 hours. (Patient taking differently: Dissolve 1 tablet by mouth at earliest onset of migraine.  May repeat in 2 hours if needed.  Maximum of 2 tablets in 24 hours.)  . VITAMIN D, CHOLECALCIFEROL, PO Take by mouth.     Allergies:    Latex, Cranberry juice powder, Naproxen, Nickel,  and Sulfa antibiotics   Social History: Social History   Socioeconomic History  . Marital status: Married    Spouse name: Not on file  . Number of children: 0  . Years of education: Not on file  . Highest education level: Not on file  Occupational History  . Occupation: Systems developer porfolio  Tobacco Use  . Smoking status: Never Smoker  . Smokeless tobacco: Never Used  Vaping Use  . Vaping Use: Never used  Substance and Sexual Activity  . Alcohol use: Yes    Alcohol/week: 1.0 standard drink    Types: 1 Glasses of wine per week    Comment: occ  . Drug use: No  . Sexual activity: Yes    Partners: Male    Birth control/protection: None, Condom    Comment: Married  Other Topics Concern  . Not on file  Social History Narrative   - Married, no children.   Psychiatrist education.   - She works FT as a Patent attorney.   - Her aunt & uncle were her guardians when she was in HS. They live in [place], [place].    - She has a fraternal twin sister.    - Wears her seatbelt, smoke detectors in the home.   Right handed   Social Determinants of Health   Financial Resource Strain: Not on file  Food Insecurity: Not on file  Transportation Needs: Not on file  Physical Activity: Not on file  Stress: Not on file  Social Connections: Not on file     Family History: The patient's family history includes Alcohol abuse in her maternal aunt, maternal uncle, and mother; Allergic rhinitis in her sister; Asthma in her sister; Bipolar disorder in her mother; Cancer in her father; Eczema in her sister; Emphysema in her maternal grandmother and paternal grandmother; Hashimoto's thyroiditis in her paternal aunt; Heart disease in her paternal grandfather; Hyperlipidemia in her paternal aunt; Tourette syndrome in her maternal aunt and maternal grandfather. There is no history of Suicidality, Urticaria, or Angioedema.  ROS:   All other ROS reviewed and negative. Pertinent positives noted in the HPI.      EKGs/Labs/Other Studies Reviewed:   The following studies were personally reviewed by me today:    TTE 07/29/2020 1. Prominent apical and lateral LV wall trabeculations . Left ventricular  ejection fraction, by estimation, is 60 to 65%. The left ventricle has  normal function. The left ventricle has no regional wall motion  abnormalities. Left ventricular diastolic  parameters were normal.  2. Right ventricular systolic function is normal. The right ventricular  size is normal.  3. The mitral valve is normal in structure. Trivial mitral valve  regurgitation. No evidence of mitral stenosis.  4. The aortic valve is tricuspid. Aortic valve regurgitation is trivial.  No aortic stenosis is present.  5. The inferior vena cava  is normal in size with greater than 50%  respiratory variability, suggesting right atrial pressure of 3 mmHg.   Zio 07/28/2020 Impression: 1. No arrhythmias detected.  2. Rare ectopy.    Recent Labs: 06/07/2020: ALT 15; BUN 9; Creatinine, Ser 0.75; Hemoglobin 13.3; Platelets 163.0; Potassium 4.0; Sodium 136; TSH 1.66   Recent Lipid Panel    Component Value Date/Time   CHOL 125 10/27/2019 1357   TRIG 54 10/27/2019 1357   HDL 64 10/27/2019 1357   CHOLHDL 2.0 10/27/2019 1357   VLDL 10.4 06/03/2018 0847   LDLCALC 48 10/27/2019 1357   LDLDIRECT 53.0 07/14/2016 0833    Physical Exam:   VS:  BP 120/78   Pulse 84   Ht 5\' 6"  (1.676 m)   Wt 182 lb (82.6 kg)   LMP 08/09/2020   SpO2 98%   BMI 29.38 kg/m    Wt Readings from Last 3 Encounters:  08/26/20 182 lb (82.6 kg)  08/24/20 182 lb (82.6 kg)  08/09/20 181 lb (82.1 kg)    General: Well nourished, well developed, in no acute distress Head: Atraumatic, normal size  Eyes: PEERLA, EOMI  Neck: Supple, no JVD Endocrine: No thryomegaly Cardiac: Normal S1, S2; RRR; no murmurs, rubs, or gallops Lungs: Clear to auscultation bilaterally, no wheezing, rhonchi or rales  Abd: Soft, nontender, no hepatomegaly   Ext: No edema, pulses 2+ Musculoskeletal: No deformities, BUE and BLE strength normal and equal Skin: Warm and dry, no rashes   Neuro: Alert and oriented to person, place, time, and situation, CNII-XII grossly intact, no focal deficits  Psych: Normal mood and affect   ASSESSMENT:   AVIVA WOLFER is a 30 y.o. female who presents for the following: 1. Syncope and collapse   2. Vasovagal syncope     PLAN:   1. Syncope and collapse 2. Vasovagal syncope -She had 3 syncopal episodes several months ago.  They occurred upon standing.  Describes having lightheadedness and blurry vision.  She came to quickly.  No seizure-like activity reported.  She also has elevated heart rate sensation when she stands.  This is occurred for years as well.  I suspect her symptoms were vasovagal related.  Have improved with hydration and she was found to be B12 deficient.  Her symptoms also improved with B12 supplementation.  She also could have a POTS like condition.  The treatment is conservative regardless.  I recommended she continue with 80 to 100 ounces of water daily.  She should continue with regular exercise.  She should also continue with adding salt to her diet.  She has no alarming symptoms.  Her cardiovascular examination is normal.  Her EKG is normal.  Her monitor was normal without arrhythmia.  Her echocardiogram was normal.  We also discussed she could take a beta-blocker or ivabradine to lessen her symptoms of heart rate increasing.  For now she would like continue with conservative measures.  Her symptoms have improved with hydration and exercise as well as B12 supplementation.  For now I recommend she continue with conservative measures.  She will see 37 as needed moving forward.  Disposition: Return if symptoms worsen or fail to improve.  Medication Adjustments/Labs and Tests Ordered: Current medicines are reviewed at length with the patient today.  Concerns regarding medicines are outlined above.   No orders of the defined types were placed in this encounter.  No orders of the defined types were placed in this encounter.   Patient Instructions  Medication Instructions:  The  current medical regimen is effective;  continue present plan and medications.  *If you need a refill on your cardiac medications before your next appointment, please call your pharmacy*   Follow-Up: At [facility], you and your health needs are our priority.  As part of our continuing mission to provide you with exceptional heart care, we have created designated Provider Care Teams.  These Care Teams include your primary Cardiologist (physician) and Advanced Practice Providers (APPs -  Physician Assistants and Nurse Practitioners) who all work together to provide you with the care you need, when you need it.  We recommend signing up for the patient portal called "MyChart".  Sign up information is provided on this After Visit Summary.  MyChart is used to connect with patients for Virtual Visits (Telemedicine).  Patients are able to view lab/test results, encounter notes, upcoming appointments, etc.  Non-urgent messages can be sent to your provider as well.   To learn more about what you can do with MyChart, go to [email or web address].    Your next appointment:   As needed  The format for your next appointment:   In Person  Provider:   Lennie Odor, MD        Time Spent with Patient: I have spent a total of 25 minutes with patient reviewing hospital notes, telemetry, EKGs, labs and examining the patient as well as establishing an assessment and plan that was discussed with the patient.  > 50% of time was spent in direct patient care.  Signed, Lenna Gilford. Flora Lipps, MD, [facility]  [facility]  [address] [place], [place] [address] [phone]  08/26/2020 10:05 AM

## 2020-08-26 ENCOUNTER — Ambulatory Visit: Payer: No Typology Code available for payment source | Admitting: Cardiovascular Disease

## 2020-08-26 ENCOUNTER — Encounter: Payer: Self-pay | Admitting: Cardiovascular Disease

## 2020-08-26 ENCOUNTER — Ambulatory Visit (INDEPENDENT_AMBULATORY_CARE_PROVIDER_SITE_OTHER): Payer: No Typology Code available for payment source | Admitting: Licensed Clinical Social Worker

## 2020-08-26 ENCOUNTER — Other Ambulatory Visit: Payer: Self-pay

## 2020-08-26 VITALS — BP 120/78 | HR 84 | Ht 66.0 in | Wt 182.0 lb

## 2020-08-26 DIAGNOSIS — R55 Syncope and collapse: Secondary | ICD-10-CM

## 2020-08-26 DIAGNOSIS — F411 Generalized anxiety disorder: Secondary | ICD-10-CM | POA: Diagnosis not present

## 2020-08-26 NOTE — Patient Instructions (Signed)
Medication Instructions:  The current medical regimen is effective;  continue present plan and medications.  *If you need a refill on your cardiac medications before your next appointment, please call your pharmacy*    Follow-Up: At [facility], you and your health needs are our priority.  As part of our continuing mission to provide you with exceptional heart care, we have created designated Provider Care Teams.  These Care Teams include your primary Cardiologist (physician) and Advanced Practice Providers (APPs -  Physician Assistants and Nurse Practitioners) who all work together to provide you with the care you need, when you need it.  We recommend signing up for the patient portal called "MyChart".  Sign up information is provided on this After Visit Summary.  MyChart is used to connect with patients for Virtual Visits (Telemedicine).  Patients are able to view lab/test results, encounter notes, upcoming appointments, etc.  Non-urgent messages can be sent to your provider as well.   To learn more about what you can do with MyChart, go to [email or web address].    Your next appointment:   As needed  The format for your next appointment:   In Person  Provider:   [place] O'Neal, MD      

## 2020-08-30 ENCOUNTER — Other Ambulatory Visit: Payer: Self-pay

## 2020-08-30 ENCOUNTER — Encounter (HOSPITAL_COMMUNITY): Payer: Self-pay | Admitting: Licensed Clinical Social Worker

## 2020-08-30 ENCOUNTER — Ambulatory Visit (INDEPENDENT_AMBULATORY_CARE_PROVIDER_SITE_OTHER): Payer: No Typology Code available for payment source | Admitting: Licensed Clinical Social Worker

## 2020-08-30 DIAGNOSIS — F411 Generalized anxiety disorder: Secondary | ICD-10-CM | POA: Diagnosis not present

## 2020-08-30 NOTE — Progress Notes (Signed)
Virtual Visit via Video Note  I connected with Cindy Jones  On 08/30/20 at 1:00pm EST by a video enabled telemedicine application and verified that I am speaking with the correct person using two identifiers.   I discussed the limitations of evaluation and management by telemedicine and the availability of in person appointments. The patient expressed understanding and agreed to proceed.  LOCATION: Patient:Home Provider: Home Office  History of Present Illness: Pt was referred to therapy by her psychiatrist at [facility] for anxiety and panic attacks.   Participation Level: Active   Type of Therapy: Virtual Video individual therapy  Treatment Goals addressed: Improve psychiatric symptoms, Controlled Behavior, Moderated Mood, Improve Unhelpful Thought Patterns, Emotional Regulation Skills (Moderate moods, anger management, stress management), Feel and express a full Range of Emotions, Learn about Diagnosis, Healthy Coping Skills.  Interventions: CBT/Supportive/Psychoeducation  Summary: Patient presented for today's session on time and was alert, oriented x5, with no evidence or self-report of SI/HI or A/V H.  Patient reported ongoing compliance with medication.  Clinician inquired about patient's current emotional ratings, as well as any significant changes in thoughts, feelings or behavior since previous session.  Patient reported scores of 3/10 for depression, 6/10 for anxiety, 2/10 for anger/irritability, and denied any reoccurrence of panic attacks. Pt presented tearful to her session. Cln explored with pt an email to be sent to her best friend, intent, hopes, feelings, concerns. Clinician explored emotional state and noted that having some crying spells was not an indication of being emotionally unwell.     Assessment and plan: Counselor will continue to meet with patient to address treatment plan goals. Patient will continue to follow recommendations of providers and implement skills learned  in session.  Suicidal/Homicidal: Nowithout intent/plan  Therapist Response: Assessed pt's current functioning and reviewed progress.. Assisted pt processing stressors, negative thoughts.  Assisted pt processing for the management of her stressors.  Participation Level: Active  Diagnosis: Axis I.:  GAD    Follow Up Instructions: I discussed the assessment and treatment plan with the patient. The patient was provided an opportunity to ask questions and all were answered. The patient agreed with the plan and demonstrated an understanding of the instructions.   The patient was advised to call back or seek an in-person evaluation if the symptoms worsen or if the condition fails to improve as anticipated.  I provided 60 minutes of non-face-to-face time during this encounter.   Tyreik Delahoussaye S, LCAS

## 2020-08-30 NOTE — Progress Notes (Signed)
Virtual Visit via Video Note  I connected with Cindy Jones  On 08/28/20 at 1:00pm EST by a video enabled telemedicine application and verified that I am speaking with the correct person using two identifiers.   I discussed the limitations of evaluation and management by telemedicine and the availability of in person appointments. The patient expressed understanding and agreed to proceed.  LOCATION: Patient:Home Provider: Home Office  History of Present Illness: Pt was referred to therapy by her psychiatrist at [facility] for anxiety and panic attacks.   Participation Level: Active   Type of Therapy: Virtual Video individual therapy  Treatment Goals addressed: Improve psychiatric symptoms, Controlled Behavior, Moderated Mood, Improve Unhelpful Thought Patterns, Emotional Regulation Skills (Moderate moods, anger management, stress management), Feel and express a full Range of Emotions, Learn about Diagnosis, Healthy Coping Skills.  Interventions: CBT/Supportive/Psychoeducation  Summary: Patient presented for today's session on time and was alert, oriented x5, with no evidence or self-report of SI/HI or A/V H.  Patient reported ongoing compliance with medication.  Clinician inquired about patient's current emotional ratings, as well as any significant changes in thoughts, feelings or behavior since previous session.  Patient reported scores of 1/10 for depression, 6/10 for anxiety, 2/10 for anger/irritability, and denied any reoccurrence of panic attacks. Pt called for an additional session today to help process communication errors with her best friend which has led to a dysfunctional friendship.  Cln provided psychoeducation on effective  communication skills      Assessment and plan: Counselor will continue to meet with patient to address treatment plan goals. Patient will continue to follow recommendations of providers and implement skills learned in session.  Suicidal/Homicidal: Nowithout  intent/plan  Therapist Response: Assessed pt's current functioning and reviewed progress.. Assisted pt processing stressors, negative thoughts.  Assisted pt processing for the management of her stressors.  Participation Level: Active  Diagnosis: Axis I.:  GAD    Follow Up Instructions: I discussed the assessment and treatment plan with the patient. The patient was provided an opportunity to ask questions and all were answered. The patient agreed with the plan and demonstrated an understanding of the instructions.   The patient was advised to call back or seek an in-person evaluation if the symptoms worsen or if the condition fails to improve as anticipated.  I provided 25 minutes of non-face-to-face time during this encounter.   Lelon Ikard S, LCAS

## 2020-08-31 ENCOUNTER — Other Ambulatory Visit (HOSPITAL_COMMUNITY): Payer: Self-pay

## 2020-09-06 ENCOUNTER — Encounter: Payer: Self-pay | Admitting: Family Medicine

## 2020-09-07 ENCOUNTER — Encounter (HOSPITAL_COMMUNITY): Payer: Self-pay | Admitting: Licensed Clinical Social Worker

## 2020-09-07 ENCOUNTER — Ambulatory Visit (INDEPENDENT_AMBULATORY_CARE_PROVIDER_SITE_OTHER): Payer: No Typology Code available for payment source | Admitting: Licensed Clinical Social Worker

## 2020-09-07 ENCOUNTER — Other Ambulatory Visit: Payer: Self-pay

## 2020-09-07 DIAGNOSIS — F411 Generalized anxiety disorder: Secondary | ICD-10-CM | POA: Diagnosis not present

## 2020-09-07 NOTE — Progress Notes (Signed)
Virtual Visit via Video Note  I connected with Cindy Jones  On 09/07/20 at 2:00pm EST by a video enabled telemedicine application and verified that I am speaking with the correct person using two identifiers.   I discussed the limitations of evaluation and management by telemedicine and the availability of in person appointments. The patient expressed understanding and agreed to proceed.  LOCATION: Patient:Home Provider: Home Office  History of Present Illness: Pt was referred to therapy by her psychiatrist at [facility] for anxiety and panic attacks.   Participation Level: Active   Type of Therapy: Virtual Video individual therapy  Treatment Goals addressed: Improve psychiatric symptoms, Controlled Behavior, Moderated Mood, Improve Unhelpful Thought Patterns, Emotional Regulation Skills (Moderate moods, anger management, stress management), Feel and express a full Range of Emotions, Learn about Diagnosis, Healthy Coping Skills.  Interventions: CBT/Supportive/Psychoeducation  Summary: Patient presented for today's session on time and was alert, oriented x5, with no evidence or self-report of SI/HI or A/V H.  Patient reported ongoing compliance with medication.  Clinician inquired about patient's current emotional ratings, as well as any significant changes in thoughts, feelings or behavior since previous session.  Patient reported scores of 3/10 for depression, 6/10 for anxiety, 2/10 for anger/irritability, and denied any reoccurrence of panic attacks. Pt presented tearful to her session. Pt asked her husband to join the session as they both are experiencing the loss of a friendship. Cln used CBT to assist patient and her husband in moving forward, reframing negative thought patterns. Cln opened up discussion on the friendship. Both participated talking to each other.     Assessment and plan: Counselor will continue to meet with patient to address treatment plan goals. Patient will continue to  follow recommendations of providers and implement skills learned in session.  Suicidal/Homicidal: Nowithout intent/plan  Therapist Response: Assessed pt's current functioning and reviewed progress.. Assisted pt processing stressors, negative thoughts.  Assisted pt processing for the management of her stressors.  Participation Level: Active  Diagnosis: Axis I.:  GAD    Follow Up Instructions: I discussed the assessment and treatment plan with the patient. The patient was provided an opportunity to ask questions and all were answered. The patient agreed with the plan and demonstrated an understanding of the instructions.   The patient was advised to call back or seek an in-person evaluation if the symptoms worsen or if the condition fails to improve as anticipated.  I provided 60 minutes of non-face-to-face time during this encounter.   Myka Lukins S, LCAS

## 2020-09-09 ENCOUNTER — Telehealth (HOSPITAL_COMMUNITY): Payer: Self-pay | Admitting: *Deleted

## 2020-09-09 ENCOUNTER — Telehealth (HOSPITAL_COMMUNITY): Payer: No Typology Code available for payment source | Admitting: Psychiatry

## 2020-09-09 NOTE — Telephone Encounter (Signed)
Patient called about appt today and stated she did not get a call.  Front desk to reschedule.

## 2020-09-13 ENCOUNTER — Ambulatory Visit: Payer: No Typology Code available for payment source | Admitting: Family Medicine

## 2020-09-13 ENCOUNTER — Other Ambulatory Visit: Payer: Self-pay

## 2020-09-13 ENCOUNTER — Encounter (HOSPITAL_COMMUNITY): Payer: Self-pay | Admitting: Licensed Clinical Social Worker

## 2020-09-13 ENCOUNTER — Ambulatory Visit (INDEPENDENT_AMBULATORY_CARE_PROVIDER_SITE_OTHER): Payer: No Typology Code available for payment source | Admitting: Licensed Clinical Social Worker

## 2020-09-13 DIAGNOSIS — F411 Generalized anxiety disorder: Secondary | ICD-10-CM

## 2020-09-13 NOTE — Progress Notes (Signed)
Virtual Visit via Video Note  I connected with Cindy Jones  On 09/13/20 at 1:00pm EST by a video enabled telemedicine application and verified that I am speaking with the correct person using two identifiers.   I discussed the limitations of evaluation and management by telemedicine and the availability of in person appointments. The patient expressed understanding and agreed to proceed.  LOCATION: Patient:Home Provider: Home Office  History of Present Illness: Pt was referred to therapy by her psychiatrist at [facility] for anxiety and panic attacks.   Participation Level: Active   Type of Therapy: Virtual Video individual therapy  Treatment Goals addressed: Improve psychiatric symptoms, Controlled Behavior, Moderated Mood, Improve Unhelpful Thought Patterns, Emotional Regulation Skills (Moderate moods, anger management, stress management), Feel and express a full Range of Emotions, Learn about Diagnosis, Healthy Coping Skills.  Interventions: CBT/Supportive/Psychoeducation  Summary: Patient presented for today's session on time and was alert, oriented x5, with no evidence or self-report of SI/HI or A/V H.  Patient reported ongoing compliance with medication.  Clinician inquired about patient's current emotional ratings, as well as any significant changes in thoughts, feelings or behavior since previous session.  Patient reported scores of 3/10 for depression, 6/10 for anxiety, 2/10 for anger/irritability, and denied any reoccurrence of panic attacks. Patient reports she is still feeling the loss of friendship but is moving forward. Cln provided psychoeducation on relationships. Pt asked how to begin a discussion with her mother, after a length of time not communicating. Cln explored with pt goals for resuming communication.  Will continue this at next session.     Assessment and plan: Counselor will continue to meet with patient to address treatment plan goals. Patient will continue to follow  recommendations of providers and implement skills learned in session.  Suicidal/Homicidal: Nowithout intent/plan  Therapist Response: Assessed pt's current functioning and reviewed progress.. Assisted pt processing stressors, negative thoughts.  Assisted pt processing for the management of her stressors.  Participation Level: Active  Diagnosis: Axis I.:  GAD    Follow Up Instructions: I discussed the assessment and treatment plan with the patient. The patient was provided an opportunity to ask questions and all were answered. The patient agreed with the plan and demonstrated an understanding of the instructions.   The patient was advised to call back or seek an in-person evaluation if the symptoms worsen or if the condition fails to improve as anticipated.  I provided 60 minutes of non-face-to-face time during this encounter.   Farron Lafond S, LCAS

## 2020-09-15 ENCOUNTER — Telehealth (HOSPITAL_COMMUNITY): Payer: Self-pay

## 2020-09-15 NOTE — Telephone Encounter (Signed)
Patient called requesting a refill on her Adderall XR to be sent to [facility]. Followup appointment scheduled for 6/16. Thank you

## 2020-09-16 ENCOUNTER — Other Ambulatory Visit (HOSPITAL_COMMUNITY): Payer: Self-pay | Admitting: Psychiatry

## 2020-09-16 DIAGNOSIS — F988 Other specified behavioral and emotional disorders with onset usually occurring in childhood and adolescence: Secondary | ICD-10-CM

## 2020-09-16 MED ORDER — AMPHETAMINE-DEXTROAMPHET ER 25 MG PO CP24
25.0000 mg | ORAL_CAPSULE | ORAL | 0 refills | Status: DC
  Filled 2020-09-16: qty 30, 30d supply, fill #0

## 2020-09-16 NOTE — Telephone Encounter (Signed)
done

## 2020-09-17 ENCOUNTER — Other Ambulatory Visit (HOSPITAL_COMMUNITY): Payer: Self-pay

## 2020-09-17 LAB — ALPHA-GAL PANEL
Allergen Lamb IgE: <0.1 kU/L
Beef IgE: <0.1 kU/L
IgE (Immunoglobulin E), Serum: 170 IU/mL (ref 6–495)
O215-IgE Alpha-Gal: <0.1 kU/L
Pork IgE: <0.1 kU/L

## 2020-09-17 LAB — CBC WITH DIFFERENTIAL/PLATELET
Basophils Absolute: 0.1 10*3/uL (ref 0.0–0.2)
Basos: 1 %
EOS (ABSOLUTE): 0.1 10*3/uL (ref 0.0–0.4)
Eos: 2 %
Hematocrit: 40.1 % (ref 34.0–46.6)
Hemoglobin: 13.3 g/dL (ref 11.1–15.9)
Immature Grans (Abs): 0 10*3/uL (ref 0.0–0.1)
Immature Granulocytes: 0 %
Lymphocytes Absolute: 1.3 10*3/uL (ref 0.7–3.1)
Lymphs: 19 %
MCH: 30.2 pg (ref 26.6–33.0)
MCHC: 33.2 g/dL (ref 31.5–35.7)
MCV: 91 fL (ref 79–97)
Monocytes Absolute: 0.5 10*3/uL (ref 0.1–0.9)
Monocytes: 7 %
Neutrophils Absolute: 4.9 10*3/uL (ref 1.4–7.0)
Neutrophils: 71 %
Platelets: 187 10*3/uL (ref 150–450)
RBC: 4.4 x10E6/uL (ref 3.77–5.28)
RDW: 11.9 % (ref 11.7–15.4)
WBC: 6.8 10*3/uL (ref 3.4–10.8)

## 2020-09-17 LAB — ANA W/REFLEX: Anti Nuclear Antibody (ANA): NEGATIVE

## 2020-09-17 LAB — CHRONIC URTICARIA: cu index: 1.6 (ref <10)

## 2020-09-17 LAB — TRYPTASE: Tryptase: 5 ug/L (ref 2.2–13.2)

## 2020-09-17 LAB — C-REACTIVE PROTEIN: CRP: <1 mg/L (ref 0–10)

## 2020-09-17 LAB — SEDIMENTATION RATE: Sed Rate: 2 mm/hr (ref 0–32)

## 2020-09-20 ENCOUNTER — Telehealth: Payer: Self-pay | Admitting: Family Medicine

## 2020-09-20 ENCOUNTER — Other Ambulatory Visit: Payer: Self-pay

## 2020-09-20 ENCOUNTER — Ambulatory Visit (INDEPENDENT_AMBULATORY_CARE_PROVIDER_SITE_OTHER): Payer: No Typology Code available for payment source | Admitting: Licensed Clinical Social Worker

## 2020-09-20 ENCOUNTER — Encounter: Payer: Self-pay | Admitting: Family Medicine

## 2020-09-20 ENCOUNTER — Ambulatory Visit (INDEPENDENT_AMBULATORY_CARE_PROVIDER_SITE_OTHER): Payer: No Typology Code available for payment source | Admitting: Family Medicine

## 2020-09-20 ENCOUNTER — Other Ambulatory Visit (HOSPITAL_COMMUNITY): Payer: Self-pay

## 2020-09-20 ENCOUNTER — Encounter (HOSPITAL_COMMUNITY): Payer: Self-pay | Admitting: Licensed Clinical Social Worker

## 2020-09-20 VITALS — BP 106/73 | HR 73 | Temp 98.5°F | Ht 66.0 in | Wt 174.0 lb

## 2020-09-20 DIAGNOSIS — J45990 Exercise induced bronchospasm: Secondary | ICD-10-CM

## 2020-09-20 DIAGNOSIS — E538 Deficiency of other specified B group vitamins: Secondary | ICD-10-CM

## 2020-09-20 DIAGNOSIS — E739 Lactose intolerance, unspecified: Secondary | ICD-10-CM | POA: Diagnosis not present

## 2020-09-20 DIAGNOSIS — Z8709 Personal history of other diseases of the respiratory system: Secondary | ICD-10-CM | POA: Diagnosis not present

## 2020-09-20 DIAGNOSIS — F411 Generalized anxiety disorder: Secondary | ICD-10-CM

## 2020-09-20 LAB — VITAMIN B12: Vitamin B-12: >1550 pg/mL — ABNORMAL HIGH (ref 211–911)

## 2020-09-20 LAB — VITAMIN D 25 HYDROXY (VIT D DEFICIENCY, FRACTURES): VITD: 13.6 ng/mL — ABNORMAL LOW (ref 30.00–100.00)

## 2020-09-20 MED ORDER — VITAMIN D (ERGOCALCIFEROL) 1.25 MG (50000 UNIT) PO CAPS
50000.0000 [IU] | ORAL_CAPSULE | ORAL | 0 refills | Status: DC
  Filled 2020-09-20: qty 12, 84d supply, fill #0

## 2020-09-20 NOTE — Patient Instructions (Signed)
Vitamin B12 Deficiency Vitamin B12 deficiency means that your body does not have enough vitamin B12. The body needs this vitamin:  To make red blood cells.  To make genes (DNA).  To help the nerves work. If you do not have enough vitamin B12 in your body, you can have health problems. What are the causes?  Not eating enough foods that contain vitamin B12.  Not being able to absorb vitamin B12 from the food that you eat.  Certain digestive system diseases.  A condition in which the body does not make enough of a certain protein, which results in too few red blood cells (pernicious anemia).  Having a surgery in which part of the stomach or small intestine is removed.  Taking medicines that make it hard for the body to absorb vitamin B12. These medicines include: ? Heartburn medicines. ? Some antibiotic medicines. ? Other medicines that are used to treat certain conditions. What increases the risk?  Being older than age 50.  Eating a vegetarian or vegan diet, especially while you are pregnant.  Eating a poor diet while you are pregnant.  Taking certain medicines.  Having alcoholism. What are the signs or symptoms? In some cases, there are no symptoms. If the condition leads to too few blood cells or nerve damage, symptoms can occur, such as:  Feeling weak.  Feeling tired (fatigued).  Not being hungry.  Weight loss.  A loss of feeling (numbness) or tingling in your hands and feet.  Redness and burning of the tongue.  Being mixed up (confused) or having memory problems.  Sadness (depression).  Problems with your senses. This can include color blindness, ringing in the ears, or loss of taste.  Watery poop (diarrhea) or trouble pooping (constipation).  Trouble walking. If anemia is very bad, symptoms can include:  Being short of breath.  Being dizzy.  Having a very fast heartbeat. How is this treated?  Changing the way you eat and drink, such  as: ? Eating more foods that contain vitamin B12. ? Drinking little or no alcohol.  Getting vitamin B12 shots.  Taking vitamin B12 supplements. Your doctor will tell you the dose that is best for you. Follow these instructions at home: Eating and drinking  Eat lots of healthy foods that contain vitamin B12. These include: ? Meats and poultry, such as beef, pork, chicken, turkey, and organ meats, such as liver. ? Seafood, such as clams, rainbow trout, salmon, tuna, and haddock. ? Eggs. ? Cereal and dairy products that have vitamin B12 added to them. Check the label. The items listed above may not be a complete list of what you can eat and drink. Contact a dietitian for more options.   General instructions  Get any shots as told by your doctor.  Take supplements only as told by your doctor.  Do not drink alcohol if your doctor tells you not to. In some cases, you may only be asked to limit alcohol use.  Keep all follow-up visits as told by your doctor. This is important. Contact a doctor if:  Your symptoms come back. Get help right away if:  You have trouble breathing.  You have a very fast heartbeat.  You have chest pain.  You get dizzy.  You pass out. Summary  Vitamin B12 deficiency means that your body is not getting enough vitamin B12.  In some cases, there are no symptoms of this condition.  Treatment may include making a change in the way you eat and   drink, getting vitamin B12 shots, or taking supplements.  Eat lots of healthy foods that contain vitamin B12. This information is not intended to replace advice given to you by your health care provider. Make sure you discuss any questions you have with your health care provider. Document Revised: 12/25/2017 Document Reviewed: 12/25/2017 Elsevier Patient Education  2021 Elsevier Inc.  

## 2020-09-20 NOTE — Progress Notes (Signed)
Virtual Visit via Video Note  I connected with Cindy Jones  On 09/13/20 at 1:00pm EST by a video enabled telemedicine application and verified that I am speaking with the correct person using two identifiers.   I discussed the limitations of evaluation and management by telemedicine and the availability of in person appointments. The patient expressed understanding and agreed to proceed.  LOCATION: Patient:Home Provider: Home Office  History of Present Illness: Pt was referred to therapy by her psychiatrist at [facility] for anxiety and panic attacks.   Participation Level: Active   Type of Therapy: Virtual Video individual therapy  Treatment Goals addressed: Improve psychiatric symptoms, Controlled Behavior, Moderated Mood, Improve Unhelpful Thought Patterns, Emotional Regulation Skills (Moderate moods, anger management, stress management), Feel and express a full Range of Emotions, Learn about Diagnosis, Healthy Coping Skills.  Interventions: CBT/Supportive/Psychoeducation  Summary: Patient presented for today's session on time and was alert, oriented x5, with no evidence or self-report of SI/HI or A/V H.  Patient reported ongoing compliance with medication.  Clinician inquired about patient's current emotional ratings, as well as any significant changes in thoughts, feelings or behavior since previous session.  Patient reported scores of 2/10 for depression, 6/10 for anxiety, 2/10 for anger/irritability, and denied any reoccurrence of panic attacks. Patient reports her moods have been mixed but she is trying to focus on family, work, school, friendships. Pt completed her homework assignment, "letter to self." Pts letter was about her childhood relationship with her mother. Pt described circumstances and feelings about her childhood. This led to another discussion about moving forward with communication with her mother currently. Clinician utilized MI OARS to reflect and summarize thoughts and  feelings.       Assessment and plan: Counselor will continue to meet with patient to address treatment plan goals. Patient will continue to follow recommendations of providers and implement skills learned in session.  Suicidal/Homicidal: Nowithout intent/plan  Therapist Response: Assessed pt's current functioning and reviewed progress.. Assisted pt processing stressors, negative thoughts.  Assisted pt processing for the management of her stressors.  Participation Level: Active  Diagnosis: Axis I.:  GAD    Follow Up Instructions: I discussed the assessment and treatment plan with the patient. The patient was provided an opportunity to ask questions and all were answered. The patient agreed with the plan and demonstrated an understanding of the instructions.   The patient was advised to call back or seek an in-person evaluation if the symptoms worsen or if the condition fails to improve as anticipated.  I provided 60 minutes of non-face-to-face time during this encounter.   Leon Goodnow S, LCAS

## 2020-09-20 NOTE — Progress Notes (Signed)
This visit occurred during the SARS-CoV-2 public health emergency.  Safety protocols were in place, including screening questions prior to the visit, additional usage of staff PPE, and extensive cleaning of exam room while observing appropriate contact time as indicated for disinfecting solutions.    Cindy Jones , 1990-08-05, 30 y.o., female MRN: [id number] Patient Care Team    Relationship Specialty Notifications Start End  Natalia Leatherwood, DO PCP - General Family Medicine  06/12/19   Karie Soda, MD Consulting Physician General Surgery  01/31/16   Armbruster, Willaim Rayas, MD Consulting Physician Gastroenterology  01/31/16   Reva Bores, MD Consulting Physician Obstetrics and Gynecology  10/27/19   Drema Dallas, DO Consulting Physician Neurology  08/09/20     Chief Complaint  Patient presents with  . b12 deficiency     Pt still c/o fatigue and hair loss      Subjective: Pt presents for an OV on follow up vitamin deficiency she reports she felt better on b12 inj, then she has on sublingal. Last b12 inj March. She is supplementing Vit d qod. She again has stopped all caffeine use. She feels tired.    Her b12 levels resulted in the low 200s. Folate was normal. She also mentions to day she has omitted all diary from her diet. She states she has been having a itchy rash a few times a week consistent with urticaria that resolves with benadryl use. She also endorses feeling more fatigued.  Depression screen [facility] 2/9 06/07/2020 10/27/2019 06/03/2018 11/05/2017 02/14/2017  Decreased Interest 0 0 0 0 0  Down, Depressed, Hopeless 0 0 0 0 1  PHQ - 2 Score 0 0 0 0 1  Altered sleeping - - 1 0 0  Tired, decreased energy - - 1 1 2   Change in appetite - - 0 0 0  Feeling bad or failure about yourself  - - 1 1 0  Trouble concentrating - - 1 0 1  Moving slowly or fidgety/restless - - 0 0 0  Suicidal thoughts - - 0 0 0  PHQ-9 Score - - 4 2 4   Difficult doing work/chores - - Not difficult at all - -   Some encounter information is confidential and restricted. Go to Review Flowsheets activity to see all data.  Some recent data might be hidden    Allergies  Allergen Reactions  . Latex Swelling and Rash  . Cranberry Juice Powder Other (See Comments)    Throat pain.  . Naproxen Other (See Comments)    "Liver starts hurting" Diarrhea  . Nickel     Skin peeling  . Sulfa Antibiotics Hives and Swelling   Social History   Social History Narrative   - Married, no children.   - .   - She works FT as a .   - Her aunt & uncle were her guardians when she was in HS. 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    - She has a fraternal twin sister.    - Wears her seatbelt, smoke detectors in the home.   Right handed   Past Medical History:  Diagnosis Date  . Anxiety   . Asthma    as a child  . Binge eating disorder   . Dysrhythmia    " I feel it about once a month"  . Frequent headaches   . GAD (generalized anxiety disorder)   . GERD (gastroesophageal reflux disease)   . Major depression  in complete remission (HCC)   . Migraines   . PID (acute pelvic inflammatory disease)   . Seasonal allergies   . Tourette disorder   . Urinary tract bacterial infections    Past Surgical History:  Procedure Laterality Date  . CHOLECYSTECTOMY    . ESOPHAGOGASTRODUODENOSCOPY    . LAPAROSCOPIC CHOLECYSTECTOMY SINGLE SITE WITH INTRAOPERATIVE CHOLANGIOGRAM N/A 03/15/2016   Procedure: LAPAROSCOPIC CHOLECYSTECTOMY SINGLE SITE WITH INTRAOPERATIVE CHOLANGIOGRAM;  Surgeon: Karie Soda, MD;  Location: [facility] OR;  Service: General;  Laterality: N/A;  . TONSILLECTOMY AND ADENOIDECTOMY  2013  . WISDOM TOOTH EXTRACTION     Family History  Problem Relation Age of Onset  . Alcohol abuse Mother   . Bipolar disorder Mother   . Cancer Father        sarcoma; passed away when pt was 8  . Tourette syndrome Maternal Aunt   . Alcohol abuse Maternal Aunt   . Hyperlipidemia Paternal Aunt   .  Hashimoto's thyroiditis Paternal Aunt   . Tourette syndrome Maternal Grandfather   . Heart disease Paternal Grandfather   . Asthma Sister   . Allergic rhinitis Sister   . Eczema Sister   . Alcohol abuse Maternal Uncle   . Emphysema Maternal Grandmother   . Emphysema Paternal Grandmother   . Suicidality Neg Hx   . Urticaria Neg Hx   . Angioedema Neg Hx    Allergies as of 09/20/2020      Reactions   Latex Swelling, Rash   Cranberry Juice Powder Other (See Comments)   Throat pain.   Naproxen Other (See Comments)   "Liver starts hurting" Diarrhea   Nickel    Skin peeling   Sulfa Antibiotics Hives, Swelling      Medication List       Accurate as of Sep 20, 2020  8:47 AM. If you have any questions, ask your nurse or doctor.        STOP taking these medications   famotidine 20 MG tablet Commonly known as: PEPCID Stopped by: Felix Pacini, DO   fluconazole 150 MG tablet Commonly known as: DIFLUCAN Stopped by: Felix Pacini, DO     TAKE these medications   albuterol 108 (90 Base) MCG/ACT inhaler Commonly known as: VENTOLIN HFA 1-2 puffs 15 minutes prior to exercise.   amphetamine-dextroamphetamine 25 MG 24 hr capsule Commonly known as: Adderall XR Take 1 capsule by mouth every morning.   aspirin-acetaminophen-caffeine 250-250-65 MG tablet Commonly known as: EXCEDRIN MIGRAINE Take 1-2 tablets by mouth every 6 (six) hours as needed for headache.   cetirizine 10 MG tablet Commonly known as: ZyrTEC Allergy Take 1 tablet (10 mg total) by mouth 2 (two) times daily.   ondansetron 4 MG disintegrating tablet Commonly known as: Zofran ODT Dissolve 1 tablet (4 mg total) by mouth every 8 (eight) hours as needed for nausea or vomiting.   rizatriptan 10 MG disintegrating tablet Commonly known as: Maxalt-MLT Dissolve 1 tablet by mouth at earliest onset of migraine.  May repeat in 2 hours if needed.  Maximum of 2 tablets in 24 hours.   Vitamin B-12 1000 MCG Subl Place under  the tongue.   VITAMIN D (CHOLECALCIFEROL) PO Take by mouth.       All past medical history, surgical history, allergies, family history, immunizations andmedications were updated in the EMR today and reviewed under the history and medication portions of their EMR.     ROS: Negative, with the exception of above mentioned in HPI   Objective:  BP 106/73   Pulse 73   Temp 98.5 F (36.9 C) (Oral)   Ht 5\' 6"  (1.676 m)   Wt 174 lb (78.9 kg)   SpO2 99%   BMI 28.08 kg/m  Body mass index is 28.08 kg/m. Gen: Afebrile. No acute distress.  HENT: AT. .  Eyes:Pupils Equal Round Reactive to light, Extraocular movements intact,  Conjunctiva without redness, discharge or icterus. CV: RRR Chest: CTAB, no wheeze or crackles Skin: no rashes, purpura or petechiae.  Neuro: Normal gait. PERLA. EOMi. Alert. Oriented x3 Psych: Normal affect, dress and demeanor. Normal speech. Normal thought content and judgment.    No exam data present No results found. No results found for this or any previous visit (from the past 24 hour(s)).  Assessment/Plan: Cindy Jones is a 30 y.o. female present for OV for  B12 deficiency/ lactose intolerant - rpt b12 and vit d levels today.  - I have encouraged her to continue her follow recs by cardio  - continue  subl B12 1000 mcg qd for now. SHe may need b12 inj q 2 or 4 weeks. Dependent upon labs -continue vit d otc 800u (she is taking qod).    Reviewed expectations re: course of current medical issues.  Discussed self-management of symptoms.  Outlined signs and symptoms indicating need for more acute intervention.  Patient verbalized understanding and all questions were answered.  Patient received an After-Visit Summary.    No orders of the defined types were placed in this encounter.  No orders of the defined types were placed in this encounter.  Referral Orders  No referral(s) requested today     Note is dictated utilizing voice  recognition software. Although note has been proof read prior to signing, occasional typographical errors still can be missed. If any questions arise, please do not hesitate to call for verification.   electronically signed by:  37, DO  [facility] - OR

## 2020-09-20 NOTE — Telephone Encounter (Signed)
Please call patient Cindy Jones B12 is excellent and greater than 1500.  Which means she is absorbing sublingual B12. Cindy Jones vitamin D is extremely low at 9.  This is most likely why she is so fatigued now.  Normal levels are greater than 30 and for bone health we usually try to reach a goal between 40-50.   -I have called in a once weekly high-dose vitamin D that is to be taken with food.  She should also start an over-the-counter vitamin D daily of 2000 units after the above prescribed vitamin D is completed. -Follow-up in another 10-12 weeks with provider and we will recheck vitamin D levels as well that day.

## 2020-09-21 ENCOUNTER — Other Ambulatory Visit (HOSPITAL_COMMUNITY): Payer: Self-pay

## 2020-09-21 NOTE — Telephone Encounter (Signed)
Spoke with pt regarding labs and instructions.   

## 2020-09-22 ENCOUNTER — Encounter: Payer: Self-pay | Admitting: Family Medicine

## 2020-09-22 NOTE — Progress Notes (Signed)
RE: Cindy Jones MRN: [id number] DOB: July 19, 1990 Date of Telemedicine Visit: 09/23/2020  Referring provider: Natalia Leatherwood, DO Primary care provider: Natalia Leatherwood, DO  Chief Complaint: Allergies (Mild hives on her finger once since last seen, double antihistamine but no famotidine. )   Telemedicine Follow Up Visit via Telephone: I connected with Cindy Jones for a follow up on 09/23/20 by telephone and verified that I am speaking with the correct person using two identifiers.   I discussed the limitations, risks, security and privacy concerns of performing an evaluation and management service by telephone and the availability of in person appointments. I also discussed with the patient that there may be a patient responsible charge related to this service. The patient expressed understanding and agreed to proceed.  Patient is at home/work. Provider is at the office.  Visit start time: 12:09PM Visit end time: 12:18PM Insurance consent/check in by: front desk Medical consent and medical assistant/nurse: Lachelle S.  History of Present Illness: She is a 30 y.o. female, who is being followed for urticaria, adverse food reaction, allergic rhinitis and history of asthma. Her previous allergy office visit was on 08/24/2020 with Dr. Selena Jones. Today is a regular follow up visit.  Urticaria One episode of rash outbreak on the finger which resolved within a few hours.  Currently on zyrtec 10mg  twice a day and not needed to use famotidine.   Other adverse food reactions Reintroduced fruits, vegetables back into the diet with no issues. She is working with nutritionist. Avoiding beer.   Other allergic rhinitis Had some headaches after being outdoors for a long time. Took benadryl with good benefit.   History of asthma Used albuterol during peloton class.  Vitamin D deficient and now on supplements.   Patient recently exposed to covid-19 and had to do televisit.   Assessment and  Plan: Cindy Jones is a 30 y.o. female with: Urticaria Past history - Pruritic rash for 3 years now occurring a few times per week. Concerned about food allergies. She paid $900+ to do some type of food testing which showed multiple positives - avoiding those foods did not help. Taking zyrtec/benadryl with some benefit. 1 new dog at home around onset of symptoms.  Interim history - doing well with zyrtec 10mg  BID, did not take famotidine. Bloodwork normal.  Continue Zyrtec 10mg  twice a day. o If no rash/hives after 1 month then take zyrtec 10mg  once a day. If you get hives then go back to twice a day.   Avoid the following potential triggers: alcohol, tight clothing, NSAIDs, hot showers and getting overheated.  Other allergic rhinitis Past history - 2022 skin testing was Positive to dust mites, cat, mold. Borderline to dog.  Interim history - stable.   Continue environmental control measures as below.  The above antihistamines should also help these symptoms.  Other adverse food reactions, not elsewhere classified, subsequent encounter Past history - Avoiding some foods due to concerned about sensitivity after she did some food testing via bloodwork. 2022 skin testing was borderline positive to walnuts and hops - which she does not consume regularly.  Interim history - reintroduced fruits and vegetables with no issues.  Keep a food journal with symptoms and foods eaten.  See if you notice worsening symptoms with walnuts or hops.  History of asthma Past history - Asthma as a child which was quiescent until recently when noting some dyspnea on exertion mainly. Tried albuterol with unknown benefit. 2022 spirometry showed: normal pattern with no  significant improvement in FEV1 post bronchodilator treatment. Clinically feeling unchanged.   May use albuterol rescue inhaler 2 puffs every 4 to 6 hours as needed for shortness of breath, chest tightness, coughing, and wheezing. May use albuterol rescue  inhaler 2 puffs 5 to 15 minutes prior to strenuous physical activities. Monitor frequency of use.   Return in about 4 months (around 01/24/2021).  No orders of the defined types were placed in this encounter.  Lab Orders  No laboratory test(s) ordered today    Diagnostics: None.  Medication List:  Current Outpatient Medications  Medication Sig Dispense Refill  . albuterol (VENTOLIN HFA) 108 (90 Base) MCG/ACT inhaler 1-2 puffs 15 minutes prior to exercise. 18 g 5  . amphetamine-dextroamphetamine (ADDERALL XR) 25 MG 24 hr capsule Take 1 capsule by mouth every morning. 30 capsule 0  . cetirizine (ZYRTEC ALLERGY) 10 MG tablet Take 1 tablet (10 mg total) by mouth 2 (two) times daily. 60 tablet 3  . Cyanocobalamin (VITAMIN B-12) 1000 MCG SUBL Place under the tongue.    . ondansetron (ZOFRAN ODT) 4 MG disintegrating tablet Dissolve 1 tablet (4 mg total) by mouth every 8 (eight) hours as needed for nausea or vomiting. 20 tablet 5  . rizatriptan (MAXALT-MLT) 10 MG disintegrating tablet Dissolve 1 tablet by mouth at earliest onset of migraine.  May repeat in 2 hours if needed.  Maximum of 2 tablets in 24 hours. (Patient taking differently: Dissolve 1 tablet by mouth at earliest onset of migraine.  May repeat in 2 hours if needed.  Maximum of 2 tablets in 24 hours.) 10 tablet 5  . VITAMIN D, CHOLECALCIFEROL, PO Take by mouth.    . Vitamin D, Ergocalciferol, (DRISDOL) 1.25 MG (50000 UNIT) CAPS capsule Take 1 capsule by mouth every 7 days. 12 capsule 0   No current facility-administered medications for this visit.   Allergies: Allergies  Allergen Reactions  . Latex Swelling and Rash  . Cranberry Juice Powder Other (See Comments)    Throat pain.  . Naproxen Other (See Comments)    "Liver starts hurting" Diarrhea  . Nickel     Skin peeling  . Sulfa Antibiotics Hives and Swelling   I reviewed her past medical history, social history, family history, and environmental history and no significant  changes have been reported from her previous visit.  Review of Systems  Constitutional: Negative for appetite change, chills, fever and unexpected weight change.  HENT: Negative for congestion and rhinorrhea.   Eyes: Negative for itching.  Respiratory: Negative for cough, chest tightness and wheezing.   Cardiovascular: Negative for chest pain.  Gastrointestinal: Negative for abdominal pain.  Genitourinary: Negative for difficulty urinating.  Skin: Negative for rash.  Allergic/Immunologic: Positive for environmental allergies.  Neurological: Negative for headaches.   Objective: Physical Exam Not obtained as encounter was done via telephone.   Previous notes and tests were reviewed.  I discussed the assessment and treatment plan with the patient. The patient was provided an opportunity to ask questions and all were answered. The patient agreed with the plan and demonstrated an understanding of the instructions. After visit summary/patient instructions available via mychart.   The patient was advised to call back or seek an in-person evaluation if the symptoms worsen or if the condition fails to improve as anticipated.  I provided 9 minutes of non-face-to-face time during this encounter.  It was my pleasure to participate in [place]'s care today. Please feel free to contact me with any questions or concerns.  Sincerely,  Rexene Alberts, DO Allergy & Immunology  [facility] [place] office: [place] office: [phone]

## 2020-09-23 ENCOUNTER — Other Ambulatory Visit: Payer: Self-pay

## 2020-09-23 ENCOUNTER — Ambulatory Visit (INDEPENDENT_AMBULATORY_CARE_PROVIDER_SITE_OTHER): Payer: No Typology Code available for payment source | Admitting: Allergy

## 2020-09-23 ENCOUNTER — Encounter: Payer: Self-pay | Admitting: Allergy

## 2020-09-23 DIAGNOSIS — Z8709 Personal history of other diseases of the respiratory system: Secondary | ICD-10-CM

## 2020-09-23 DIAGNOSIS — L509 Urticaria, unspecified: Secondary | ICD-10-CM | POA: Diagnosis not present

## 2020-09-23 DIAGNOSIS — J3089 Other allergic rhinitis: Secondary | ICD-10-CM | POA: Diagnosis not present

## 2020-09-23 DIAGNOSIS — T781XXD Other adverse food reactions, not elsewhere classified, subsequent encounter: Secondary | ICD-10-CM

## 2020-09-23 NOTE — Assessment & Plan Note (Signed)
Past history - 2022 skin testing was Positive to dust mites, cat, mold. Borderline to dog.  Interim history - stable.   Continue environmental control measures as below.  The above antihistamines should also help these symptoms.

## 2020-09-23 NOTE — Assessment & Plan Note (Signed)
Past history - Asthma as a child which was quiescent until recently when noting some dyspnea on exertion mainly. Tried albuterol with unknown benefit. 2022 spirometry showed: normal pattern with no significant improvement in FEV1 post bronchodilator treatment. Clinically feeling unchanged.   May use albuterol rescue inhaler 2 puffs every 4 to 6 hours as needed for shortness of breath, chest tightness, coughing, and wheezing. May use albuterol rescue inhaler 2 puffs 5 to 15 minutes prior to strenuous physical activities. Monitor frequency of use.

## 2020-09-23 NOTE — Assessment & Plan Note (Signed)
Past history - Pruritic rash for 3 years now occurring a few times per week. Concerned about food allergies. She paid $900+ to do some type of food testing which showed multiple positives - avoiding those foods did not help. Taking zyrtec/benadryl with some benefit. 1 new dog at home around onset of symptoms.  Interim history - doing well with zyrtec 10mg  BID, did not take famotidine. Bloodwork normal.  Continue Zyrtec 10mg  twice a day. o If no rash/hives after 1 month then take zyrtec 10mg  once a day. If you get hives then go back to twice a day.   Avoid the following potential triggers: alcohol, tight clothing, NSAIDs, hot showers and getting overheated.

## 2020-09-23 NOTE — Patient Instructions (Addendum)
Rash/hives:  Continue Zyrtec 10mg  twice a day. o If no rash/hives after 1 month then take zyrtec 10mg  once a day. If you get hives then go back to twice a day.   Avoid the following potential triggers: alcohol, tight clothing, NSAIDs, hot showers and getting overheated.  Food:  Keep a food journal with symptoms and foods eaten.  See if you notice worsening symptoms with walnuts or hops.  Environmental allergies  2022 skin testing Positive to dust mites, cat, mold. Borderline to dog.   Continue environmental control measures as below.  The above antihistamines should also help these symptoms.  Breathing:  May use albuterol rescue inhaler 2 puffs every 4 to 6 hours as needed for shortness of breath, chest tightness, coughing, and wheezing. May use albuterol rescue inhaler 2 puffs 5 to 15 minutes prior to strenuous physical activities. Monitor frequency of use.   Follow up in 4 months or sooner if needed.   Control of House Dust Mite Allergen . Dust mite allergens are a common trigger of allergy and asthma symptoms. While they can be found throughout the house, these microscopic creatures thrive in warm, humid environments such as bedding, upholstered furniture and carpeting. . Because so much time is spent in the bedroom, it is essential to reduce mite levels there.  . Encase pillows, mattresses, and box springs in special allergen-proof fabric covers or airtight, zippered plastic covers.  . Bedding should be washed weekly in hot water (130 F) and dried in a hot dryer. Allergen-proof covers are available for comforters and pillows that can't be regularly washed.  the allergy-proof covers every few months. Minimize clutter in the bedroom. Keep pets out of the bedroom.  2023 Keep humidity less than 50% by using a dehumidifier or air conditioning. You can buy a humidity measuring device called a hygrometer to monitor this.  . If possible, replace carpets with hardwood, linoleum, or  washable area rugs. If that's not possible, vacuum frequently with a vacuum that has a HEPA filter. . Remove all upholstered furniture and non-washable window drapes from the bedroom. . Remove all non-washable stuffed toys from the bedroom.  Wash stuffed toys weekly. Pet Allergen Avoidance: . Contrary to popular opinion, there are no "hypoallergenic" breeds of dogs or cats. That is because people are not allergic to an animal's hair, but to an allergen found in the animal's saliva, dander (dead skin flakes) or urine. Pet allergy symptoms typically occur within minutes. For some people, symptoms can build up and become most severe 8 to 12 hours after contact with the animal. People with severe allergies can experience reactions in public places if dander has been transported on the pet owners' clothing. Jones Cindy Keeping an animal outdoors is only a partial solution, since homes with pets in the yard still have higher concentrations of animal allergens. . Before getting a pet, ask your allergist to determine if you are allergic to animals. If your pet is already considered part of your family, try to minimize contact and keep the pet out of the bedroom and other rooms where you spend a great deal of time. . As with dust mites, vacuum carpets often or replace carpet with a hardwood floor, tile or linoleum. . High-efficiency particulate air (HEPA) cleaners can reduce allergen levels over time. . While dander and saliva are the source of cat and dog allergens, urine is the source of allergens from rabbits, hamsters, mice and [name] pigs; so ask a non-allergic family member to  clean the animal's cage. . If you have a pet allergy, talk to your allergist about the potential for allergy immunotherapy (allergy shots). This strategy can often provide long-term relief. Mold Control . Mold and fungi can grow on a variety of surfaces provided certain temperature and moisture conditions exist.  . Outdoor molds grow on  plants, decaying vegetation and soil. The major outdoor mold, Alternaria and Cladosporium, are found in very high numbers during hot and dry conditions. Generally, a late summer - fall peak is seen for common outdoor fungal spores. Rain will temporarily lower outdoor mold spore count, but counts rise rapidly when the rainy period ends. . The most important indoor molds are Aspergillus and Penicillium. Dark, humid and poorly ventilated basements are ideal sites for mold growth. The next most common sites of mold growth are the bathroom and the kitchen. Outdoor (Seasonal) Mold Control . Use air conditioning and keep windows closed. . Avoid exposure to decaying vegetation. [name] Avoid leaf raking. . Avoid grain handling. . Consider wearing a face mask if working in moldy areas.  Indoor (Perennial) Mold Control  . Maintain humidity below 50%. . Get rid of mold growth on hard surfaces with water, detergent and, if necessary, 5% bleach (do not mix with other cleaners). Then dry the area completely. If mold covers an area more than 10 square feet, consider hiring an indoor environmental professional. . For clothing, washing with soap and water is best. If moldy items cannot be cleaned and dried, throw them away. . Remove sources e.g. contaminated carpets. . Repair and seal leaking roofs or pipes. Using dehumidifiers in damp basements may be helpful, but empty the water and clean units regularly to prevent mildew from forming. All rooms, especially basements, bathrooms and kitchens, require ventilation and cleaning to deter mold and mildew growth. Avoid carpeting on concrete or damp floors, and storing items in damp areas.

## 2020-09-23 NOTE — Assessment & Plan Note (Signed)
Past history - Avoiding some foods due to concerned about sensitivity after she did some food testing via bloodwork. 2022 skin testing was borderline positive to walnuts and hops - which she does not consume regularly.  Interim history - reintroduced fruits and vegetables with no issues.  Keep a food journal with symptoms and foods eaten.  See if you notice worsening symptoms with walnuts or hops.

## 2020-09-28 ENCOUNTER — Other Ambulatory Visit: Payer: Self-pay

## 2020-09-28 ENCOUNTER — Ambulatory Visit (INDEPENDENT_AMBULATORY_CARE_PROVIDER_SITE_OTHER): Payer: No Typology Code available for payment source | Admitting: Licensed Clinical Social Worker

## 2020-09-28 ENCOUNTER — Encounter (HOSPITAL_COMMUNITY): Payer: Self-pay | Admitting: Licensed Clinical Social Worker

## 2020-09-28 DIAGNOSIS — F411 Generalized anxiety disorder: Secondary | ICD-10-CM

## 2020-09-28 NOTE — Progress Notes (Signed)
Virtual Visit via Video Note  I connected with Cindy Jones  On 09/28/20 at 1:00pm EST by a video enabled telemedicine application and verified that I am speaking with the correct person using two identifiers.   I discussed the limitations of evaluation and management by telemedicine and the availability of in person appointments. The patient expressed understanding and agreed to proceed.  LOCATION: Patient:Home Provider: Home Office  History of Present Illness: Pt was referred to therapy by her psychiatrist at [facility] for anxiety and panic attacks.   Participation Level: Active   Type of Therapy: Virtual Video individual therapy  Treatment Goals addressed: Improve psychiatric symptoms, Controlled Behavior, Moderated Mood, Improve Unhelpful Thought Patterns, Emotional Regulation Skills (Moderate moods, anger management, stress management), Feel and express a full Range of Emotions, Learn about Diagnosis, Healthy Coping Skills.  Interventions: CBT/Supportive/Psychoeducation  Summary: Patient presented for today's session on time and was alert, oriented x5, with no evidence or self-report of SI/HI or A/V H.  Patient reported ongoing compliance with medication.  Clinician inquired about patient's current emotional ratings, as well as any significant changes in thoughts, feelings or behavior since previous session.  Patient reported scores of 1/10 for depression,2/10 for anxiety, 1/10 for anger/irritability, and denied any reoccurrence of panic attacks. Cln explored with pt how to effectively handle the variety of life's anxieties. Cln assisted pt in identifying her current life anxieties:  School, work, family relationships.Clinician utilized CBT to address his thought processes. Clinician provided thought stopping tools, as well as reality testing to provide support and instill confidence. Cln explored with pt the introductory text she wants to send to her mother to begin a conversation. Suggested  pt review the text for 24 hours before she sends it.         Assessment and plan: Counselor will continue to meet with patient to address treatment plan goals. Patient will continue to follow recommendations of providers and implement skills learned in session.  Suicidal/Homicidal: Nowithout intent/plan  Therapist Response: Assessed pt's current functioning and reviewed progress.. Assisted pt processing stressors, negative thoughts.  Assisted pt processing for the management of her stressors.  Participation Level: Active  Diagnosis: Axis I.:  GAD    Follow Up Instructions: I discussed the assessment and treatment plan with the patient. The patient was provided an opportunity to ask questions and all were answered. The patient agreed with the plan and demonstrated an understanding of the instructions.   The patient was advised to call back or seek an in-person evaluation if the symptoms worsen or if the condition fails to improve as anticipated.  I provided 60 minutes of non-face-to-face time during this encounter.   Airen Dales S, LCAS

## 2020-10-04 ENCOUNTER — Other Ambulatory Visit: Payer: Self-pay

## 2020-10-04 ENCOUNTER — Encounter (HOSPITAL_COMMUNITY): Payer: Self-pay | Admitting: Licensed Clinical Social Worker

## 2020-10-04 ENCOUNTER — Ambulatory Visit (INDEPENDENT_AMBULATORY_CARE_PROVIDER_SITE_OTHER): Payer: No Typology Code available for payment source | Admitting: Licensed Clinical Social Worker

## 2020-10-04 DIAGNOSIS — F411 Generalized anxiety disorder: Secondary | ICD-10-CM | POA: Diagnosis not present

## 2020-10-04 NOTE — Progress Notes (Signed)
Virtual Visit via Video Note  I connected with Cindy Jones  On 10/04/20 at 1:00pm EST by a video enabled telemedicine application and verified that I am speaking with the correct person using two identifiers.   I discussed the limitations of evaluation and management by telemedicine and the availability of in person appointments. The patient expressed understanding and agreed to proceed.  LOCATION: Patient:Home Provider: Home Office  History of Present Illness: Pt was referred to therapy by her psychiatrist at [facility] for anxiety and panic attacks.   Participation Level: Active   Type of Therapy: Virtual Video individual therapy  Treatment Goals addressed: Improve psychiatric symptoms, Controlled Behavior, Moderated Mood, Improve Unhelpful Thought Patterns, Emotional Regulation Skills (Moderate moods, anger management, stress management), Feel and express a full Range of Emotions, Learn about Diagnosis, Healthy Coping Skills.  Interventions: CBT/Supportive/Psychoeducation  Summary: Patient presented for today's session on time and was alert, oriented x5, with no evidence or self-report of SI/HI or A/V H.  Patient reported ongoing compliance with medication.  Clinician inquired about patient's current emotional ratings, as well as any significant changes in thoughts, feelings or behavior since previous session.  Patient reported scores of 1/10 for depression,2/10 for anxiety, 1/10 for anger/irritability, and denied any reoccurrence of panic attacks. Cln explored with pt her current life anxieties:  School, work, family relationships. Cln and pt explored current coping skills. Cln explored with pt the text to send to her mother to begin communication. Cln and pt explored expectations, responses, feelings of communication with her mother. Pt was tearful in discussing the possibility of having conversations with her mother again, "I've been hurt by her so many times." Cln assisted patient reframing  her negative thoughts.         Assessment and plan: Counselor will continue to meet with patient to address treatment plan goals. Patient will continue to follow recommendations of providers and implement skills learned in session.  Suicidal/Homicidal: Nowithout intent/plan  Therapist Response: Assessed pt's current functioning and reviewed progress.. Assisted pt processing stressors, negative thoughts.  Assisted pt processing for the management of her stressors.  Participation Level: Active  Diagnosis: Axis I.:  GAD    Follow Up Instructions: I discussed the assessment and treatment plan with the patient. The patient was provided an opportunity to ask questions and all were answered. The patient agreed with the plan and demonstrated an understanding of the instructions.   The patient was advised to call back or seek an in-person evaluation if the symptoms worsen or if the condition fails to improve as anticipated.  I provided 60 minutes of non-face-to-face time during this encounter.   Zyrion Coey S, LCAS

## 2020-10-11 ENCOUNTER — Encounter (HOSPITAL_COMMUNITY): Payer: Self-pay | Admitting: Licensed Clinical Social Worker

## 2020-10-11 ENCOUNTER — Ambulatory Visit (INDEPENDENT_AMBULATORY_CARE_PROVIDER_SITE_OTHER): Payer: No Typology Code available for payment source | Admitting: Licensed Clinical Social Worker

## 2020-10-11 ENCOUNTER — Other Ambulatory Visit: Payer: Self-pay

## 2020-10-11 DIAGNOSIS — F411 Generalized anxiety disorder: Secondary | ICD-10-CM

## 2020-10-11 NOTE — Progress Notes (Signed)
Virtual Visit via Video Note  I connected with Cindy Jones  On 10/04/20 at 1:00pm EST by a video enabled telemedicine application and verified that I am speaking with the correct person using two identifiers.   I discussed the limitations of evaluation and management by telemedicine and the availability of in person appointments. The patient expressed understanding and agreed to proceed.  LOCATION: Patient:Home Provider: Home Office  History of Present Illness: Pt was referred to therapy by her psychiatrist at [facility] for anxiety and panic attacks.   Participation Level: Active   Type of Therapy: Virtual Video individual therapy  Treatment Goals addressed: Improve psychiatric symptoms, Controlled Behavior, Moderated Mood, Improve Unhelpful Thought Patterns, Emotional Regulation Skills (Moderate moods, anger management, stress management), Feel and express a full Range of Emotions, Learn about Diagnosis, Healthy Coping Skills.  Interventions: CBT/Supportive/Psychoeducation  Summary: Patient presented for today's session on time and was alert, oriented x5, with no evidence or self-report of SI/HI or A/V H.  Patient reported ongoing compliance with medication.  Clinician inquired about patient's current emotional ratings, as well as any significant changes in thoughts, feelings or behavior since previous session.  Patient reported scores of 1/10 for depression,2/10 for anxiety, 1/10 for anger/irritability, and denied any reoccurrence of panic attacks. Cln explored with pt her current life anxieties:  School, work, family relationships. Cln and pt explored current coping skills. Cln reviewed tx plan with pt who verbalized acceptance of the plan. Pt reports she overate candy while at the movies and had feelings of guilt. Cln discussed portion control. Clinician utilized CBT to address thought processes. Clinician provided thought stopping tools, as well as reality testing to provide support and  confidence in her choices. Cln encouraged pt to talk with her nutritionist. Pt made the decision to send her mother the text this evening.            Assessment and plan: Counselor will continue to meet with patient to address treatment plan goals. Patient will continue to follow recommendations of providers and implement skills learned in session.  Suicidal/Homicidal: Nowithout intent/plan  Therapist Response: Assessed pt's current functioning and reviewed progress.. Assisted pt processing stressors, negative thoughts.  Assisted pt processing for the management of her stressors.  Participation Level: Active  Diagnosis: Axis I.:  GAD    Follow Up Instructions: I discussed the assessment and treatment plan with the patient. The patient was provided an opportunity to ask questions and all were answered. The patient agreed with the plan and demonstrated an understanding of the instructions.   The patient was advised to call back or seek an in-person evaluation if the symptoms worsen or if the condition fails to improve as anticipated.  I provided 60 minutes of non-face-to-face time during this encounter.   Zoe Nordin S, LCAS

## 2020-10-14 ENCOUNTER — Other Ambulatory Visit (HOSPITAL_COMMUNITY): Payer: Self-pay

## 2020-10-14 ENCOUNTER — Telehealth (INDEPENDENT_AMBULATORY_CARE_PROVIDER_SITE_OTHER): Payer: No Typology Code available for payment source | Admitting: Psychiatry

## 2020-10-14 ENCOUNTER — Other Ambulatory Visit: Payer: Self-pay

## 2020-10-14 DIAGNOSIS — F410 Panic disorder [episodic paroxysmal anxiety] without agoraphobia: Secondary | ICD-10-CM | POA: Diagnosis not present

## 2020-10-14 DIAGNOSIS — F988 Other specified behavioral and emotional disorders with onset usually occurring in childhood and adolescence: Secondary | ICD-10-CM

## 2020-10-14 DIAGNOSIS — F502 Bulimia nervosa: Secondary | ICD-10-CM

## 2020-10-14 DIAGNOSIS — F411 Generalized anxiety disorder: Secondary | ICD-10-CM

## 2020-10-14 DIAGNOSIS — F952 Tourette's disorder: Secondary | ICD-10-CM

## 2020-10-14 MED ORDER — AMPHETAMINE-DEXTROAMPHET ER 25 MG PO CP24
25.0000 mg | ORAL_CAPSULE | ORAL | 0 refills | Status: DC
  Filled 2020-10-14 – 2020-12-22 (×3): qty 30, 30d supply, fill #0

## 2020-10-14 MED ORDER — AMPHETAMINE-DEXTROAMPHETAMINE 10 MG PO TABS
10.0000 mg | ORAL_TABLET | Freq: Every day | ORAL | 0 refills | Status: DC
  Filled 2020-10-14 – 2020-11-22 (×2): qty 30, 30d supply, fill #0

## 2020-10-14 MED ORDER — AMPHETAMINE-DEXTROAMPHET ER 25 MG PO CP24
25.0000 mg | ORAL_CAPSULE | ORAL | 0 refills | Status: DC
  Filled 2020-10-14 – 2020-10-22 (×2): qty 30, 30d supply, fill #0

## 2020-10-14 MED ORDER — AMPHETAMINE-DEXTROAMPHETAMINE 10 MG PO TABS
10.0000 mg | ORAL_TABLET | Freq: Every day | ORAL | 0 refills | Status: DC
  Filled 2020-10-14: qty 30, 30d supply, fill #0

## 2020-10-14 MED ORDER — AMPHETAMINE-DEXTROAMPHET ER 25 MG PO CP24
25.0000 mg | ORAL_CAPSULE | ORAL | 0 refills | Status: DC
  Filled 2020-10-14 – 2020-11-22 (×2): qty 30, 30d supply, fill #0

## 2020-10-14 NOTE — Progress Notes (Signed)
Virtual Visit via Video Note  I connected with Cindy Jones on 10/14/20 at 10:30 AM EDT by a video enabled telemedicine application and verified that I am speaking with the correct person using two identifiers.  Location: Patient: home Provider: office   I discussed the limitations of evaluation and management by telemedicine and the availability of in person appointments. The patient expressed understanding and agreed to proceed.  History of Present Illness: Cindy Jones is doing well. She is starting online classes on June 21st. It will be 1-2x/week until the end of fall. 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 On the plus side she is able to make her own schedule and it gives her time to focus on school. Adderall remains effective. She is tired after it wears off. She does get distracted when she is bored. Cindy Jones makes small goals to help prevent distraction. Cindy Jones went to her cardiologist and her BP when normal. 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 She has been contacted and is thinking about interviewing. This has helped with her self confidence. She is open to starting a new job. This decision was hard to make and caused a lot of anxiety. Now she is feeling better and her anxiety is low. They are planning on trying to start trying to have a baby in December. She is sleeping and eating well. She is exercising in the morning and is working with a nutritionist. She has given up coffee. She denies any panic attacks for the last few weeks. Cindy Jones is thinking she has a new tic where is stretching her jaw due to jaw clenching. Her eye tic is ongoing and worse when tired.  Cindy Jones denies depression and anhedonia. She denies SI/HI.    Observations/Objective: Psychiatric Specialty Exam: ROS  There were no vitals taken for this visit.There is no height or weight on file to calculate BMI.  General Appearance: Neat and Well Groomed  Eye Contact:  Good  Speech:  Clear and Coherent and  Normal Rate  Volume:  Normal  Mood:  Euthymic  Affect:  Full Range  Thought Process:  Goal Directed, Linear, and Descriptions of Associations: Intact  Orientation:  Full (Time, Place, and Person)  Thought Content:  Logical  Suicidal Thoughts:  No  Homicidal Thoughts:  No  Memory:  Immediate;   Good  Judgement:  Good  Insight:  Good  Psychomotor Activity:  Normal  Concentration:  Concentration: Good  Recall:  Good  Fund of Knowledge:  Good  Language:  Good  Akathisia:  No  Handed:  Right  AIMS (if indicated):     Assets:  Communication Skills Desire for Improvement Financial Resources/Insurance Housing Intimacy Leisure Time Physical Health Resilience Social Support Talents/Skills Transportation Vocational/Educational  ADL's:  Intact  Cognition:  WNL  Sleep:        Assessment and Plan: Depression screen [facility] 2/9 10/14/2020 08/19/2020 07/22/2020 06/24/2020 06/07/2020  Decreased Interest 0 0 0 0 0  Down, Depressed, Hopeless 0 0 0 0 0  PHQ - 2 Score 0 0 0 0 0  Altered sleeping - - - - -  Tired, decreased energy - - - - -  Change in appetite - - - - -  Feeling bad or failure about yourself  - - - - -  Trouble concentrating - - - - -  Moving slowly or fidgety/restless - - - - -  Suicidal thoughts - - - - -  PHQ-9 Score - - - - -  Difficult doing work/chores - - - - -  Some recent data might be hidden   1. ADD (attention deficit disorder) without hyperactivity - amphetamine-dextroamphetamine (ADDERALL) 10 MG tablet; Take 1 tablet (10 mg total) by mouth daily.  Dispense: 30 tablet; Refill: 0 - amphetamine-dextroamphetamine (ADDERALL XR) 25 MG 24 hr capsule; Take 1 capsule by mouth every morning.  Dispense: 30 capsule; Refill: 0 - amphetamine-dextroamphetamine (ADDERALL) 10 MG tablet; Take 1 tablet (10 mg total) by mouth daily.  Dispense: 30 tablet; Refill: 0 - amphetamine-dextroamphetamine (ADDERALL XR) 25 MG 24 hr capsule; Take 1 capsule by mouth every morning.  Dispense:  30 capsule; Refill: 0 - amphetamine-dextroamphetamine (ADDERALL XR) 25 MG 24 hr capsule; Take 1 capsule by mouth every morning.  Dispense: 30 capsule; Refill: 0  2. GAD (generalized anxiety disorder)  3. Panic attacks  4. Tourette disease  5. Bulimia nervosa  - Cindy Jones plans to starting trying to have a baby in December 2022. She would like to get off all stimulants during this time. Shada will discuss it further as the time arrives. She is aware of the potential teratogenic effects of stimulants on the fetus.   Follow Up Instructions: In 2-3 months or sooner if needed   I discussed the assessment and treatment plan with the patient. The patient was provided an opportunity to ask questions and all were answered. The patient agreed with the plan and demonstrated an understanding of the instructions.   The patient was advised to call back or seek an in-person evaluation if the symptoms worsen or if the condition fails to improve as anticipated.  I provided 20 minutes of non-face-to-face time during this encounter.   Oletta Darter, MD

## 2020-10-15 ENCOUNTER — Other Ambulatory Visit (HOSPITAL_COMMUNITY): Payer: Self-pay

## 2020-10-18 ENCOUNTER — Ambulatory Visit (HOSPITAL_COMMUNITY): Payer: No Typology Code available for payment source | Admitting: Licensed Clinical Social Worker

## 2020-10-18 ENCOUNTER — Other Ambulatory Visit: Payer: Self-pay

## 2020-10-22 ENCOUNTER — Ambulatory Visit (INDEPENDENT_AMBULATORY_CARE_PROVIDER_SITE_OTHER): Payer: No Typology Code available for payment source | Admitting: Family Medicine

## 2020-10-22 ENCOUNTER — Other Ambulatory Visit (HOSPITAL_COMMUNITY): Payer: Self-pay

## 2020-10-22 ENCOUNTER — Encounter: Payer: Self-pay | Admitting: Family Medicine

## 2020-10-22 ENCOUNTER — Other Ambulatory Visit: Payer: Self-pay

## 2020-10-22 VITALS — BP 119/71 | HR 66 | Temp 98.1°F | Ht 64.75 in | Wt 171.0 lb

## 2020-10-22 DIAGNOSIS — E559 Vitamin D deficiency, unspecified: Secondary | ICD-10-CM | POA: Diagnosis not present

## 2020-10-22 DIAGNOSIS — Z79899 Other long term (current) drug therapy: Secondary | ICD-10-CM

## 2020-10-22 DIAGNOSIS — E663 Overweight: Secondary | ICD-10-CM | POA: Diagnosis not present

## 2020-10-22 DIAGNOSIS — Z0000 Encounter for general adult medical examination without abnormal findings: Secondary | ICD-10-CM | POA: Diagnosis not present

## 2020-10-22 LAB — COMPREHENSIVE METABOLIC PANEL
ALT: 17 U/L (ref 0–35)
AST: 16 U/L (ref 0–37)
Albumin: 4.7 g/dL (ref 3.5–5.2)
Alkaline Phosphatase: 35 U/L — ABNORMAL LOW (ref 39–117)
BUN: 13 mg/dL (ref 6–23)
CO2: 25 mEq/L (ref 19–32)
Calcium: 9.4 mg/dL (ref 8.4–10.5)
Chloride: 102 mEq/L (ref 96–112)
Creatinine, Ser: 0.72 mg/dL (ref 0.40–1.20)
GFR: 112.59 mL/min (ref >60.00)
Glucose, Bld: 71 mg/dL (ref 70–99)
Potassium: 4.3 mEq/L (ref 3.5–5.1)
Sodium: 137 mEq/L (ref 135–145)
Total Bilirubin: 0.7 mg/dL (ref 0.2–1.2)
Total Protein: 6.9 g/dL (ref 6.0–8.3)

## 2020-10-22 LAB — LIPID PANEL
Cholesterol: 124 mg/dL (ref 0–200)
HDL: 48.6 mg/dL (ref >39.00)
LDL Cholesterol: 65 mg/dL (ref 0–99)
NonHDL: 75.16
Total CHOL/HDL Ratio: 3
Triglycerides: 51 mg/dL (ref 0.0–149.0)
VLDL: 10.2 mg/dL (ref 0.0–40.0)

## 2020-10-22 LAB — VITAMIN D 25 HYDROXY (VIT D DEFICIENCY, FRACTURES): VITD: 32.61 ng/mL (ref 30.00–100.00)

## 2020-10-22 MED ORDER — ALBUTEROL SULFATE HFA 108 (90 BASE) MCG/ACT IN AERS
INHALATION_SPRAY | RESPIRATORY_TRACT | 5 refills | Status: DC
  Filled 2020-10-22: qty 18, fill #0

## 2020-10-22 NOTE — Patient Instructions (Signed)
Health Maintenance, Female Adopting a healthy lifestyle and getting preventive care are important in promoting health and wellness. Ask your health care provider about: The right schedule for you to have regular tests and exams. Things you can do on your own to prevent diseases and keep yourself healthy. What should I know about diet, weight, and exercise? Eat a healthy diet  Eat a diet that includes plenty of vegetables, fruits, low-fat dairy products, and lean protein. Do not eat a lot of foods that are high in solid fats, added sugars, or sodium.  Maintain a healthy weight Body mass index (BMI) is used to identify weight problems. It estimates body fat based on height and weight. Your health care provider can help determineyour BMI and help you achieve or maintain a healthy weight. Get regular exercise Get regular exercise. This is one of the most important things you can do for your health. Most adults should: Exercise for at least 150 minutes each week. The exercise should increase your heart rate and make you sweat (moderate-intensity exercise). Do strengthening exercises at least twice a week. This is in addition to the moderate-intensity exercise. Spend less time sitting. Even light physical activity can be beneficial. Watch cholesterol and blood lipids Have your blood tested for lipids and cholesterol at 30 years of age, then havethis test every 5 years. Have your cholesterol levels checked more often if: Your lipid or cholesterol levels are high. You are older than 30 years of age. You are at high risk for heart disease. What should I know about cancer screening? Depending on your health history and family history, you may need to have cancer screening at various ages. This may include screening for: Breast cancer. Cervical cancer. Colorectal cancer. Skin cancer. Lung cancer. What should I know about heart disease, diabetes, and high blood pressure? Blood pressure and heart  disease High blood pressure causes heart disease and increases the risk of stroke. This is more likely to develop in people who have high blood pressure readings, are of African descent, or are overweight. Have your blood pressure checked: Every 3-5 years if you are 18-39 years of age. Every year if you are 40 years old or older. Diabetes Have regular diabetes screenings. This checks your fasting blood sugar level. Have the screening done: Once every three years after age 40 if you are at a normal weight and have a low risk for diabetes. More often and at a younger age if you are overweight or have a high risk for diabetes. What should I know about preventing infection? Hepatitis B If you have a higher risk for hepatitis B, you should be screened for this virus. Talk with your health care provider to find out if you are at risk forhepatitis B infection. Hepatitis C Testing is recommended for: Everyone born from 1945 through 1965. Anyone with known risk factors for hepatitis C. Sexually transmitted infections (STIs) Get screened for STIs, including gonorrhea and chlamydia, if: You are sexually active and are younger than 30 years of age. You are older than 30 years of age and your health care provider tells you that you are at risk for this type of infection. Your sexual activity has changed since you were last screened, and you are at increased risk for chlamydia or gonorrhea. Ask your health care provider if you are at risk. Ask your health care provider about whether you are at high risk for HIV. Your health care provider may recommend a prescription medicine to help   prevent HIV infection. If you choose to take medicine to prevent HIV, you should first get tested for HIV. You should then be tested every 3 months for as long as you are taking the medicine. Pregnancy If you are about to stop having your period (premenopausal) and you may become pregnant, seek counseling before you get  pregnant. Take 400 to 800 micrograms (mcg) of folic acid every day if you become pregnant. Ask for birth control (contraception) if you want to prevent pregnancy. Osteoporosis and menopause Osteoporosis is a disease in which the bones lose minerals and strength with aging. This can result in bone fractures. If you are 65 years old or older, or if you are at risk for osteoporosis and fractures, ask your health care provider if you should: Be screened for bone loss. Take a calcium or vitamin D supplement to lower your risk of fractures. Be given hormone replacement therapy (HRT) to treat symptoms of menopause. Follow these instructions at home: Lifestyle Do not use any products that contain nicotine or tobacco, such as cigarettes, e-cigarettes, and chewing tobacco. If you need help quitting, ask your health care provider. Do not use street drugs. Do not share needles. Ask your health care provider for help if you need support or information about quitting drugs. Alcohol use Do not drink alcohol if: Your health care provider tells you not to drink. You are pregnant, may be pregnant, or are planning to become pregnant. If you drink alcohol: Limit how much you use to 0-1 drink a day. Limit intake if you are breastfeeding. Be aware of how much alcohol is in your drink. In the [place] one drink equals one 12 oz bottle of beer (355 mL), one 5 oz glass of wine (148 mL), or one 1 oz glass of hard liquor (44 mL). General instructions Schedule regular health, dental, and eye exams. Stay current with your vaccines. Tell your health care provider if: You often feel depressed. You have ever been abused or do not feel safe at home. Summary Adopting a healthy lifestyle and getting preventive care are important in promoting health and wellness. Follow your health care provider's instructions about healthy diet, exercising, and getting tested or screened for diseases. Follow your health care provider's  instructions on monitoring your cholesterol and blood pressure. This information is not intended to replace advice given to you by your health care provider. Make sure you discuss any questions you have with your healthcare provider. Document Revised: 04/10/2018 Document Reviewed: 04/10/2018 Elsevier Patient Education  2022 [employer]  

## 2020-10-22 NOTE — Progress Notes (Signed)
ativan  This visit occurred during the SARS-CoV-2 public health emergency.  Safety protocols were in place, including screening questions prior to the visit, additional usage of staff PPE, and extensive cleaning of exam room while observing appropriate contact time as indicated for disinfecting solutions.    Patient ID: Cindy Jones, female  DOB: Sep 06, 1990, 30 y.o.   MRN: [id number] Patient Care Team    Relationship Specialty Notifications Start End  Natalia Leatherwood, DO PCP - General Family Medicine  06/12/19   Karie Soda, MD Consulting Physician General Surgery  01/31/16   Armbruster, Willaim Rayas, MD Consulting Physician Gastroenterology  01/31/16   Reva Bores, MD Consulting Physician Obstetrics and Gynecology  10/27/19   Drema Dallas, DO Consulting Physician Neurology  08/09/20     Chief Complaint  Patient presents with   Annual Exam    Pt is fasting;     Subjective: Cindy Jones is a 30 y.o.  Female  present for CPE. All past medical history, surgical history, allergies, family history, immunizations, medications and social history were updated in the electronic medical record today. All recent labs, ED visits and hospitalizations within the last year were reviewed.  Health maintenance:  Colonoscopy:routine screen at 19,  EGD completed 12/2015 Dr. Adela Lank Mammogram: No Fhx screen at 40 Cervical cancer screening: last pap:06/09/2019, Dr. Shawnie Pons Immunizations: tdap UTD 2017, Influenza  (encouraged yearly), COVID series completed x3. Infectious disease screening: HIV completed 2015 DEXA: N/A Assistive device: none Oxygen HQI:ONGE Patient has a Dental home. Hospitalizations/ED visits: reviewed  Depression screen [facility] 2/9 06/07/2020 10/27/2019 06/03/2018 11/05/2017  Decreased Interest 0 0 0 0  Down, Depressed, Hopeless 0 0 0 0  PHQ - 2 Score 0 0 0 0  Altered sleeping - - 1 0  Tired, decreased energy - - 1 1  Change in appetite - - 0 0  Feeling bad or failure about yourself  - - 1  1  Trouble concentrating - - 1 0  Moving slowly or fidgety/restless - - 0 0  Suicidal thoughts - - 0 0  PHQ-9 Score - - 4 2  Difficult doing work/chores - - Not difficult at all -  Some encounter information is confidential and restricted. Go to Review Flowsheets activity to see all data.  Some recent data might be hidden   GAD 7 : Generalized Anxiety Score 06/03/2018 11/05/2017 02/14/2017  Nervous, Anxious, on Edge 1 1 2   Control/stop worrying 1 0 1  Worry too much - different things 1 1 1   Trouble relaxing 1 1 1   Restless 0 0 0  Easily annoyed or irritable 1 1 1   Afraid - awful might happen 0 0 0  Total GAD 7 Score 5 4 6   Anxiety Difficulty Not difficult at all - -  Some encounter information is confidential and restricted. Go to Review Flowsheets activity to see all data.           Immunization History  Administered Date(s) Administered   Influenza Inj Mdck Quad With Preservative 03/31/2017, 02/08/2018   Influenza,inj,Quad PF,6+ Mos 06/08/2014   Influenza-Unspecified 02/24/2015, 02/05/2018, 02/18/2019, 05/07/2020   Moderna Sars-Covid-2 Vaccination 05/07/2020   PFIZER(Purple Top)SARS-COV-2 Vaccination 07/21/2019, 08/11/2019   Td 07/14/2015   Tdap 05/01/2005     Past Medical History:  Diagnosis Date   Anxiety    Asthma    as a child   Binge eating disorder    Dysrhythmia    " I feel it about once a month"  Frequent headaches    GAD (generalized anxiety disorder)    GERD (gastroesophageal reflux disease)    History of acute PID 06/09/2019   Major depression in complete remission (HCC)    Migraines    PID (acute pelvic inflammatory disease)    Seasonal allergies    Tourette disorder    Urinary tract bacterial infections    Allergies  Allergen Reactions   Latex Swelling and Rash   Barley Grass Hives   Cranberry Juice Powder Other (See Comments)    Throat pain.   Naproxen Other (See Comments)    "Liver starts hurting" Diarrhea   Nickel     Skin peeling    Sulfa Antibiotics Hives and Swelling   Past Surgical History:  Procedure Laterality Date   CHOLECYSTECTOMY     ESOPHAGOGASTRODUODENOSCOPY     LAPAROSCOPIC CHOLECYSTECTOMY SINGLE SITE WITH INTRAOPERATIVE CHOLANGIOGRAM N/A 03/15/2016   Procedure: LAPAROSCOPIC CHOLECYSTECTOMY SINGLE SITE WITH INTRAOPERATIVE CHOLANGIOGRAM;  Surgeon: Karie Soda, MD;  Location: [facility] OR;  Service: General;  Laterality: N/A;   TONSILLECTOMY AND ADENOIDECTOMY  2013   WISDOM TOOTH EXTRACTION     Family History  Problem Relation Age of Onset   Alcohol abuse Mother    Bipolar disorder Mother    Cancer Father        sarcoma; passed away when pt was 8   Tourette syndrome Maternal Aunt    Alcohol abuse Maternal Aunt    Hyperlipidemia Paternal Aunt    Hashimoto's thyroiditis Paternal Aunt    Tourette syndrome Maternal Grandfather    Heart disease Paternal Grandfather    Asthma Sister    Allergic rhinitis Sister    Eczema Sister    Alcohol abuse Maternal Uncle    Emphysema Maternal Grandmother    Emphysema Paternal Grandmother    Suicidality Neg Hx    Urticaria Neg Hx    Angioedema Neg Hx    Social History   Social History Narrative   - Married, no children.   - Automotive engineer education.   - She works FT as a Patent attorney.   - Her aunt & uncle were her guardians when she was in HS. They live in [place], [place].    - She has a fraternal twin sister.    - Wears her seatbelt, smoke detectors in the home.   Right handed    Allergies as of 10/22/2020       Reactions   Latex Swelling, Rash   Barley Grass Hives   Cranberry Juice Powder Other (See Comments)   Throat pain.   Naproxen Other (See Comments)   "Liver starts hurting" Diarrhea   Nickel    Skin peeling   Sulfa Antibiotics Hives, Swelling        Medication List        Accurate as of October 22, 2020 12:07 PM. If you have any questions, ask your nurse or doctor.          STOP taking these medications    VITAMIN D (CHOLECALCIFEROL)  PO Stopped by: Felix Pacini, DO       TAKE these medications    albuterol 108 (90 Base) MCG/ACT inhaler Commonly known as: VENTOLIN HFA Inhale 1-2 puffs into the lungs 15 minutes prior to exercise as directed. What changed: additional instructions Changed by: Felix Pacini, DO   amphetamine-dextroamphetamine 10 MG tablet Commonly known as: Adderall Take 1 tablet (10 mg total) by mouth daily.   amphetamine-dextroamphetamine 25 MG 24 hr capsule Commonly known as: Adderall  XR Take 1 capsule by mouth every morning.   amphetamine-dextroamphetamine 10 MG tablet Commonly known as: Adderall Take 1 tablet (10 mg total) by mouth daily.   amphetamine-dextroamphetamine 25 MG 24 hr capsule Commonly known as: Adderall XR Take 1 capsule by mouth every morning. (12/11/20)   amphetamine-dextroamphetamine 25 MG 24 hr capsule Commonly known as: Adderall XR Take 1 capsule by mouth every morning. (07/14)   cetirizine 10 MG tablet Commonly known as: ZyrTEC Allergy Take 1 tablet (10 mg total) by mouth 2 (two) times daily.   ondansetron 4 MG disintegrating tablet Commonly known as: Zofran ODT Dissolve 1 tablet (4 mg total) by mouth every 8 (eight) hours as needed for nausea or vomiting.   rizatriptan 10 MG disintegrating tablet Commonly known as: Maxalt-MLT Dissolve 1 tablet by mouth at earliest onset of migraine.  May repeat in 2 hours if needed.  Maximum of 2 tablets in 24 hours.   Vitamin B-12 1000 MCG Subl Place under the tongue.   Vitamin D (Ergocalciferol) 1.25 MG (50000 UNIT) Caps capsule Commonly known as: DRISDOL Take 1 capsule by mouth every 7 days. (Take 1 capsule by mouth every 7 days.)        All past medical history, surgical history, allergies, family history, immunizations andmedications were updated in the EMR today and reviewed under the history and medication portions of their EMR.      CT Head Wo Contrast  Result Date: 05/11/2020 CLINICAL DATA:  Migraine for  4 days EXAM: CT HEAD WITHOUT CONTRAST TECHNIQUE: Contiguous axial images were obtained from the base of the skull through the vertex without intravenous contrast. COMPARISON:  None. FINDINGS: Brain: There is no mass, hemorrhage or extra-axial collection. The size and configuration of the ventricles and extra-axial CSF spaces are normal. The brain parenchyma is normal, without acute or chronic infarction. Vascular: No abnormal hyperdensity of the major intracranial arteries or dural venous sinuses. No intracranial atherosclerosis. Skull: The visualized skull base, calvarium and extracranial soft tissues are normal. Sinuses/Orbits: No fluid levels or advanced mucosal thickening of the visualized paranasal sinuses. No mastoid or middle ear effusion. The orbits are normal. IMPRESSION: Normal head CT. Electronically Signed   By: Deatra Robinson M.D.   On: 05/11/2020 00:36   ROS: 14 pt review of systems performed and negative (unless mentioned in an HPI)  Objective: BP 119/71   Pulse 66   Temp 98.1 F (36.7 C) (Oral)   Ht 5' 4.75" (1.645 m)   Wt 171 lb (77.6 kg)   SpO2 100%   BMI 28.68 kg/m  Gen: Afebrile. No acute distress. Nontoxic in appearance, well-developed, well-nourished, very pleasant female mildly overweight. HENT: AT. [place]. Bilateral TM visualized and normal in appearance, normal external auditory canal. MMM, no oral lesions, adequate dentition. Bilateral nares within normal limits bone. Throat without erythema, ulcerations or exudates.  No cough on exam, no hoarseness on exam. Eyes:Pupils Equal Round Reactive to light, Extraocular movements intact,  Conjunctiva without redness, discharge or icterus. Neck/lymp/endocrine: Supple, no lymphadenopathy, no thyromegaly CV: RRR no murmur, no edema, +2/4 P posterior tibialis pulses.  Chest: CTAB, no wheeze, rhonchi or crackles.  Normal respiratory effort.  Good air movement. Abd: Soft.  Flat. NTND. BS present.  No masses palpated. No hepatosplenomegaly.  No rebound tenderness or guarding. Skin: No rashes, purpura or petechiae. Warm and well-perfused. Skin intact. Neuro/Msk: Normal gait. PERLA. EOMi. Alert. Oriented x3.  Cranial nerves II through XII intact. Muscle strength 5/5 upper/lower extremity. DTRs equal bilaterally. Psych:  Normal affect, dress and demeanor. Normal speech. Normal thought content and judgment.   No results found.  Assessment/plan: Maryjean Kamily H Spizzirri is a 30 y.o. female present for CPE. Vitamin D deficiency Is taking 50k weekly - Vitamin D (25 hydroxy) Overweight (BMI 25.0-29.9) She is working with nutrition.  - Lipid panel Encounter for long-term current use of medication - Comprehensive metabolic panel Routine general medical examination at a health care facility Colonoscopy:routine screen at 45,  EGD completed 12/2015 Dr. Adela LankArmbruster Mammogram: No Fhx screen at 40 Cervical cancer screening: last pap:06/09/2019, Dr. Shawnie PonsPratt Immunizations: tdap UTD 2017, Influenza  (encouraged yearly), COVID series completed x3. Infectious disease screening: HIV completed 2015 DEXA: N/A Patient was encouraged to exercise greater than 150 minutes a week. Patient was encouraged to choose a diet filled with fresh fruits and vegetables, and lean meats. AVS provided to patient today for education/recommendation on gender specific health and safety maintenance. Return in about 1 year (around 10/22/2021) for CPE (30 min).  Orders Placed This Encounter  Procedures   Comprehensive metabolic panel   Lipid panel   Vitamin D (25 hydroxy)   Meds ordered this encounter  Medications   albuterol (VENTOLIN HFA) 108 (90 Base) MCG/ACT inhaler    Sig: Inhale 1-2 puffs into the lungs 15 minutes prior to exercise as directed.    Dispense:  18 g    Refill:  5    Hold until pt request please. Thanks.   Referral Orders  No referral(s) requested today     Electronically signed by: Felix Pacinienee Linnet Bottari, DO [facility]

## 2020-10-25 ENCOUNTER — Ambulatory Visit (INDEPENDENT_AMBULATORY_CARE_PROVIDER_SITE_OTHER): Payer: No Typology Code available for payment source | Admitting: Licensed Clinical Social Worker

## 2020-10-25 ENCOUNTER — Telehealth: Payer: Self-pay

## 2020-10-25 ENCOUNTER — Encounter (HOSPITAL_COMMUNITY): Payer: Self-pay | Admitting: Licensed Clinical Social Worker

## 2020-10-25 ENCOUNTER — Other Ambulatory Visit: Payer: Self-pay

## 2020-10-25 DIAGNOSIS — Z1283 Encounter for screening for malignant neoplasm of skin: Secondary | ICD-10-CM

## 2020-10-25 DIAGNOSIS — F411 Generalized anxiety disorder: Secondary | ICD-10-CM

## 2020-10-25 NOTE — Progress Notes (Signed)
Virtual Visit via Video Note  I connected with Cindy Jones  On 10/25/20 at 2:00pm EST by a video enabled telemedicine application and verified that I am speaking with the correct person using two identifiers.   I discussed the limitations of evaluation and management by telemedicine and the availability of in person appointments. The patient expressed understanding and agreed to proceed.  LOCATION: Patient:Home Provider: Home Office  History of Present Illness: Pt was referred to therapy by her psychiatrist at [facility] for anxiety and panic attacks.   Participation Level: Active   Type of Therapy: Virtual Video individual therapy  Treatment Goals addressed: Improve psychiatric symptoms, Controlled Behavior, Moderated Mood, Improve Unhelpful Thought Patterns, Emotional Regulation Skills (Moderate moods, anger management, stress management), Feel and express a full Range of Emotions, Learn about Diagnosis, Healthy Coping Skills.  Interventions: CBT/Supportive/Psychoeducation  Summary: Patient presented for today's session on time and was alert, oriented x5, with no evidence or self-report of SI/HI or A/V H.  Patient reported ongoing compliance with medication.  Clinician inquired about patient's current emotional ratings, as well as any significant changes in thoughts, feelings or behavior since previous session.  Patient reported scores of 1/10 for depression, 2/10 for anxiety, 1/10 for anger/irritability, and denied any reoccurrence of panic attacks. Pt reports she missed her appointment last week due to her best friend from hs has a brain tumor and she didn't want to talk about it last week. This week her friend passed. Patient is grieving, sad, wished she had made time to see her before she passed. Cln provided a safe platform for patient to talk about her feelings. Cln provided education on grief.              Assessment and plan: Counselor will continue to meet with patient to  address treatment plan goals. Patient will continue to follow recommendations of providers and implement skills learned in session.  Suicidal/Homicidal: Nowithout intent/plan  Therapist Response: Assessed pt's current functioning and reviewed progress.. Assisted pt processing stressors, negative thoughts.  Assisted pt processing for the management of her stressors.  Participation Level: Active  Diagnosis: Axis I.:  GAD    Follow Up Instructions: I discussed the assessment and treatment plan with the patient. The patient was provided an opportunity to ask questions and all were answered. The patient agreed with the plan and demonstrated an understanding of the instructions.   The patient was advised to call back or seek an in-person evaluation if the symptoms worsen or if the condition fails to improve as anticipated.  I provided 45 minutes of non-face-to-face time during this encounter.   Gracen Ringwald S, LCAS

## 2020-10-25 NOTE — Telephone Encounter (Signed)
LVM for referral to be placed

## 2020-10-25 NOTE — Telephone Encounter (Signed)
Okay to place dermatology referral for her.

## 2020-10-25 NOTE — Addendum Note (Signed)
Addended by: Maxie Barb on: 10/25/2020 01:53 PM   Modules accepted: Orders

## 2020-10-25 NOTE — Telephone Encounter (Signed)
-----   Message from Natalia Leatherwood, DO sent at 10/25/2020  7:55 AM EDT ----- Please inform patient the following information: Liver and kidney function is normal.  Electrolytes are normal. Cholesterol panel is at goal and looks great. Vitamin D levels are greatly improved from 13, now 32.  Continue the high-dose once weekly vitamin D for the 12 weeks prescribed.    -After the 12 weeks continue with over-the-counter vitamin D 1000 units daily to maintain levels.

## 2020-10-25 NOTE — Telephone Encounter (Signed)
Spoke with pt regarding labs and instructions. Pt would like to have referral to Dermatology to have her annually skin check. Please advise

## 2020-11-08 ENCOUNTER — Encounter (HOSPITAL_COMMUNITY): Payer: Self-pay | Admitting: Licensed Clinical Social Worker

## 2020-11-08 ENCOUNTER — Other Ambulatory Visit: Payer: Self-pay

## 2020-11-08 ENCOUNTER — Ambulatory Visit (INDEPENDENT_AMBULATORY_CARE_PROVIDER_SITE_OTHER): Payer: No Typology Code available for payment source | Admitting: Licensed Clinical Social Worker

## 2020-11-08 DIAGNOSIS — F411 Generalized anxiety disorder: Secondary | ICD-10-CM | POA: Diagnosis not present

## 2020-11-08 NOTE — Progress Notes (Signed)
Virtual Visit via Video Note  I connected with Cindy Jones  On 11/08/20 at 1:00pm EST by a video enabled telemedicine application and verified that I am speaking with the correct person using two identifiers.   I discussed the limitations of evaluation and management by telemedicine and the availability of in person appointments. The patient expressed understanding and agreed to proceed.  LOCATION: Patient:Home Provider: Home Office  History of Present Illness: Pt was referred to therapy by her psychiatrist at [facility] for anxiety and panic attacks.   Participation Level: Active   Type of Therapy: Virtual Video individual therapy  Treatment Goals addressed: Improve psychiatric symptoms, Controlled Behavior, Moderated Mood, Improve Unhelpful Thought Patterns, Emotional Regulation Skills (Moderate moods, anger management, stress management), Feel and express a full Range of Emotions, Learn about Diagnosis, Healthy Coping Skills.  Interventions: CBT/Supportive/Psychoeducation  Summary: Patient presented for today's session on time and was alert, oriented x5, with no evidence or self-report of SI/HI or A/V H.  Patient reported ongoing compliance with medication.  Clinician inquired about patient's current emotional ratings, as well as any significant changes in thoughts, feelings or behavior since previous session.  Patient reported scores of 1/10 for depression, 6/10 for anxiety, 2/10 for anger/irritability, and denied any reoccurrence of panic attacks. Pt reports on her funeral for her best friend from hs who passed away from a brain tumor.  Patient continues to grieve. On the positive side she got to see old friends from hs. Pt reports on her increase in anxiety due to stress from school. "I've taking a mental health day due to feeling so overwhelmed from school." Cln and patient explored using mindfulness-based stress response to assist patient in lowering her stress response. Patient was  encouraged to use mindfulness skills as a coping skills. Cln explored communication skills with pt and her husband. Pt's husband joined the session so he and pt could use effective communication skills during session.   Assessment and plan: Counselor will continue to meet with patient to address treatment plan goals. Patient will continue to follow recommendations of providers and implement skills learned in session.  Suicidal/Homicidal: Nowithout intent/plan  Therapist Response: Assessed pt's current functioning and reviewed progress.. Assisted pt processing stressors, negative thoughts.  Assisted pt processing for the management of her stressors.  Participation Level: Active  Diagnosis: Axis I.:  GAD    Follow Up Instructions: I discussed the assessment and treatment plan with the patient. The patient was provided an opportunity to ask questions and all were answered. The patient agreed with the plan and demonstrated an understanding of the instructions.   The patient was advised to call back or seek an in-person evaluation if the symptoms worsen or if the condition fails to improve as anticipated.  I provided 60 minutes of non-face-to-face time during this encounter.   Cindy Jones S, LCAS

## 2020-11-15 ENCOUNTER — Other Ambulatory Visit: Payer: Self-pay

## 2020-11-15 ENCOUNTER — Encounter (HOSPITAL_COMMUNITY): Payer: Self-pay | Admitting: Licensed Clinical Social Worker

## 2020-11-15 ENCOUNTER — Ambulatory Visit (INDEPENDENT_AMBULATORY_CARE_PROVIDER_SITE_OTHER): Payer: No Typology Code available for payment source | Admitting: Licensed Clinical Social Worker

## 2020-11-15 DIAGNOSIS — F411 Generalized anxiety disorder: Secondary | ICD-10-CM | POA: Diagnosis not present

## 2020-11-15 NOTE — Progress Notes (Addendum)
Virtual Visit via Video Note  I connected with Cindy Jones  On 11/15/20 at 1:00pm EST by a video enabled telemedicine application and verified that I am speaking with the correct person using two identifiers.   I discussed the limitations of evaluation and management by telemedicine and the availability of in person appointments. The patient expressed understanding and agreed to proceed.  LOCATION: Patient:Home Provider: Home Office  History of Present Illness: Pt was referred to therapy by her psychiatrist at [facility] for anxiety and panic attacks.   Participation Level: Active   Type of Therapy: Virtual Video individual therapy  Treatment Goals addressed: Improve psychiatric symptoms, Controlled Behavior, Moderated Mood, Improve Unhelpful Thought Patterns, Emotional Regulation Skills (Moderate moods, anger management, stress management), Feel and express a full Range of Emotions, Learn about Diagnosis, Healthy Coping Skills.  Interventions: CBT/Supportive/Psychoeducation  Summary: Patient presented for today's session on time and was alert, oriented x5, with no evidence or self-report of SI/HI or A/V H.  Patient reported ongoing compliance with medication.  Clinician inquired about patient's current emotional ratings, as well as any significant changes in thoughts, feelings or behavior since previous session.  Patient reported scores of 1/10 for depression, 6/10 for anxiety, 2/10 for anger/irritability, and denied any reoccurrence of panic attacks. Pt reports on her increased anxiety due to summer school starting. "I'm overwhelmed with all the school work, work, maintaining a marriage and home. Clinician utilized MI OARS to affirm concerns. Clinician challenged thoughts about this. Clinician processed options for communicating her concerns. Cln encouraged pt to use mindfulness skills as coping skill.     Assessment and plan: Counselor will continue to meet with patient to address treatment  plan goals. Patient will continue to follow recommendations of providers and implement skills learned in session.  Suicidal/Homicidal: Nowithout intent/plan  Therapist Response: Assessed pt's current functioning and reviewed progress.. Assisted pt processing stressors, negative thoughts.  Assisted pt processing for the management of her stressors.  Participation Level: Active  Diagnosis: Axis I.:  GAD    Follow Up Instructions: I discussed the assessment and treatment plan with the patient. The patient was provided an opportunity to ask questions and all were answered. The patient agreed with the plan and demonstrated an understanding of the instructions.   The patient was advised to call back or seek an in-person evaluation if the symptoms worsen or if the condition fails to improve as anticipated.  I provided 60 minutes of non-face-to-face time during this encounter.   Sherica Paternostro S, LCAS

## 2020-11-18 ENCOUNTER — Encounter (HOSPITAL_COMMUNITY): Payer: Self-pay | Admitting: Licensed Clinical Social Worker

## 2020-11-22 ENCOUNTER — Other Ambulatory Visit (HOSPITAL_COMMUNITY): Payer: Self-pay

## 2020-11-22 ENCOUNTER — Ambulatory Visit (INDEPENDENT_AMBULATORY_CARE_PROVIDER_SITE_OTHER): Payer: No Typology Code available for payment source | Admitting: Licensed Clinical Social Worker

## 2020-11-22 ENCOUNTER — Encounter (HOSPITAL_COMMUNITY): Payer: Self-pay | Admitting: Licensed Clinical Social Worker

## 2020-11-22 ENCOUNTER — Other Ambulatory Visit: Payer: Self-pay

## 2020-11-22 DIAGNOSIS — F411 Generalized anxiety disorder: Secondary | ICD-10-CM | POA: Diagnosis not present

## 2020-11-22 NOTE — Progress Notes (Signed)
Virtual Visit via Video Note  I connected with Cindy Jones  On 11/22/20 at 1:00pm EST by a video enabled telemedicine application and verified that I am speaking with the correct person using two identifiers.   I discussed the limitations of evaluation and management by telemedicine and the availability of in person appointments. The patient expressed understanding and agreed to proceed.  LOCATION: Patient:Home Provider: Home Office  History of Present Illness: Pt was referred to therapy by her psychiatrist at [facility] for anxiety and panic attacks.   Participation Level: Active   Type of Therapy: Virtual Video individual therapy  Treatment Goals addressed: Improve psychiatric symptoms, Controlled Behavior, Moderated Mood, Improve Unhelpful Thought Patterns, Emotional Regulation Skills (Moderate moods, anger management, stress management), Feel and express a full Range of Emotions, Learn about Diagnosis, Healthy Coping Skills.  Interventions: CBT/Supportive/Psychoeducation  Summary: Patient presented for today's session on time and was alert, oriented x5, with no evidence or self-report of SI/HI or A/V H.  Patient reported ongoing compliance with medication.  Clinician inquired about patient's current emotional ratings, as well as any significant changes in thoughts, feelings or behavior since previous session.  Patient reported scores of 3/10 for depression, 2/10 for anxiety, 1/10 for anger/irritability, and denied any reoccurrence of panic attacks. Pt celebrated her 62th birthday last week. Cln and pt explored aging. Pt reports her mother came to her house and dropped off a card, without knocking, which led to a discussion of reunification with mother. Cln and pt explored regret, guilt and relief: feelings that she has because of her mother. Clinician utilized MI OARS to affirm concerns. Cln encouraged pt To send mother the text to open dialogue.   Assessment and plan: Counselor will continue  to meet with patient to address treatment plan goals. Patient will continue to follow recommendations of providers and implement skills learned in session.  Suicidal/Homicidal: Nowithout intent/plan  Therapist Response: Assessed pt's current functioning and reviewed progress.. Assisted pt processing stressors, negative thoughts.  Assisted pt processing for the management of her stressors.  Participation Level: Active  Diagnosis: Axis I.:  GAD    Follow Up Instructions: I discussed the assessment and treatment plan with the patient. The patient was provided an opportunity to ask questions and all were answered. The patient agreed with the plan and demonstrated an understanding of the instructions.   The patient was advised to call back or seek an in-person evaluation if the symptoms worsen or if the condition fails to improve as anticipated.  I provided 60 minutes of non-face-to-face time during this encounter.   Danel Requena S, LCAS

## 2020-11-29 ENCOUNTER — Other Ambulatory Visit: Payer: Self-pay

## 2020-11-29 ENCOUNTER — Encounter (HOSPITAL_COMMUNITY): Payer: Self-pay | Admitting: Licensed Clinical Social Worker

## 2020-11-29 ENCOUNTER — Ambulatory Visit (INDEPENDENT_AMBULATORY_CARE_PROVIDER_SITE_OTHER): Payer: No Typology Code available for payment source | Admitting: Licensed Clinical Social Worker

## 2020-11-29 DIAGNOSIS — F411 Generalized anxiety disorder: Secondary | ICD-10-CM

## 2020-11-29 NOTE — Progress Notes (Signed)
Virtual Visit via Video Note  I connected with Cindy Jones  On 11/29/20 at 1:00pm EST by a video enabled telemedicine application and verified that I am speaking with the correct person using two identifiers.   I discussed the limitations of evaluation and management by telemedicine and the availability of in person appointments. The patient expressed understanding and agreed to proceed.  LOCATION: Patient:Home Provider: Home Office  History of Present Illness: Pt was referred to therapy by her psychiatrist at [facility] for anxiety and panic attacks.   Participation Level: Active   Type of Therapy: Virtual Video individual therapy  Treatment Goals addressed: Improve psychiatric symptoms, Controlled Behavior, Moderated Mood, Improve Unhelpful Thought Patterns, Emotional Regulation Skills (Moderate moods, anger management, stress management), Feel and express a full Range of Emotions, Learn about Diagnosis, Healthy Coping Skills.  Interventions: CBT/Supportive/Psychoeducation  Summary: Patient presented for today's session on time and was alert, oriented x5, with no evidence or self-report of SI/HI or A/V H.  Patient reported ongoing compliance with medication.  Clinician inquired about patient's current emotional ratings, as well as any significant changes in thoughts, feelings or behavior since previous session.  Patient's ratings  3/10 for depression, 2/10 for anxiety, 1/10 for anger/irritability, and denied any reoccurrence of panic attacks. Pt shared she completed her summer school class over the weekend, "which has kept my stress low." Pt shared she had not sent her mother the text to open communication, but during the session she sent the text. Her mother immediately responded by text and agreed to having a joint therapy session.  Cln and pt explored expectations of the session. Cln and pt  explored having another session before the joint session.   Assessment and plan: Counselor will continue  to meet with patient to address treatment plan goals. Patient will continue to follow recommendations of providers and implement skills learned in session.  Suicidal/Homicidal: Nowithout intent/plan  Therapist Response: Assessed pt's current functioning and reviewed progress.. Assisted pt processing stressors, negative thoughts.  Assisted pt processing for the management of her stressors.  Participation Level: Active  Diagnosis: Axis I.:  GAD    Follow Up Instructions: I discussed the assessment and treatment plan with the patient. The patient was provided an opportunity to ask questions and all were answered. The patient agreed with the plan and demonstrated an understanding of the instructions.   The patient was advised to call back or seek an in-person evaluation if the symptoms worsen or if the condition fails to improve as anticipated.  I provided 60 minutes of non-face-to-face time during this encounter.   Cindy Jones S, LCAS

## 2020-11-30 ENCOUNTER — Encounter (HOSPITAL_COMMUNITY): Payer: Self-pay | Admitting: Licensed Clinical Social Worker

## 2020-11-30 ENCOUNTER — Ambulatory Visit (INDEPENDENT_AMBULATORY_CARE_PROVIDER_SITE_OTHER): Payer: No Typology Code available for payment source | Admitting: Licensed Clinical Social Worker

## 2020-11-30 ENCOUNTER — Other Ambulatory Visit: Payer: Self-pay

## 2020-11-30 DIAGNOSIS — F411 Generalized anxiety disorder: Secondary | ICD-10-CM | POA: Diagnosis not present

## 2020-11-30 NOTE — Progress Notes (Signed)
Virtual Visit via Video Note  I connected with Cindy Jones  On 12/10/20 at 10:00am EST by a video enabled telemedicine application and verified that I am speaking with the correct person using two identifiers.   I discussed the limitations of evaluation and management by telemedicine and the availability of in person appointments. The patient expressed understanding and agreed to proceed.  LOCATION: Patient:Home Provider: Home Office  History of Present Illness: Pt was referred to therapy by her psychiatrist at [facility] for anxiety and panic attacks.   Participation Level: Active   Type of Therapy: Virtual Video individual therapy  Treatment Goals addressed: Improve psychiatric symptoms, Controlled Behavior, Moderated Mood, Improve Unhelpful Thought Patterns, Emotional Regulation Skills (Moderate moods, anger management, stress management), Feel and express a full Range of Emotions, Learn about Diagnosis, Healthy Coping Skills.  Interventions: CBT/Supportive/Psychoeducation  Summary: Patient presented for today's session on time and was alert, oriented x5, with no evidence or self-report of SI/HI or A/V H.  Patient reported ongoing compliance with medication.  Clinician inquired about patient's current emotional ratings, as well as any significant changes in thoughts, feelings or behavior since previous session.  Patient's ratings  3/10 for depression, 2/10 for anxiety, 1/10 for anger/irritability, and denied any reoccurrence of panic attacks. Today's appointment was the 2nd this week with intention to role play, identify parameters and discuss possible scenarios of the upcoming family session with pt's mother next week.     Assessment and plan: Counselor will continue to meet with patient to address treatment plan goals. Patient will continue to follow recommendations of providers and implement skills learned in session.  Suicidal/Homicidal: Nowithout intent/plan  Therapist Response:  Assessed pt's current functioning and reviewed progress.. Assisted pt processing stressors, negative thoughts.  Assisted pt processing for the management of her stressors.  Participation Level: Active  Diagnosis: Axis I.:  GAD    Follow Up Instructions: I discussed the assessment and treatment plan with the patient. The patient was provided an opportunity to ask questions and all were answered. The patient agreed with the plan and demonstrated an understanding of the instructions.   The patient was advised to call back or seek an in-person evaluation if the symptoms worsen or if the condition fails to improve as anticipated.  I provided 45 minutes of non-face-to-face time during this encounter.   Phoebie Shad S, LCAS

## 2020-12-02 ENCOUNTER — Other Ambulatory Visit (HOSPITAL_COMMUNITY): Payer: Self-pay

## 2020-12-02 MED ORDER — CLINDAMYCIN PHOSPHATE 1 % EX LOTN
TOPICAL_LOTION | CUTANEOUS | 5 refills | Status: DC
  Filled 2020-12-02: qty 60, 30d supply, fill #0

## 2020-12-06 ENCOUNTER — Ambulatory Visit (INDEPENDENT_AMBULATORY_CARE_PROVIDER_SITE_OTHER): Payer: No Typology Code available for payment source | Admitting: Licensed Clinical Social Worker

## 2020-12-06 ENCOUNTER — Encounter (HOSPITAL_COMMUNITY): Payer: Self-pay | Admitting: Licensed Clinical Social Worker

## 2020-12-06 ENCOUNTER — Other Ambulatory Visit: Payer: Self-pay

## 2020-12-06 DIAGNOSIS — F411 Generalized anxiety disorder: Secondary | ICD-10-CM | POA: Diagnosis not present

## 2020-12-06 NOTE — Progress Notes (Signed)
Virtual Visit via Video Note  I connected with Cindy Jones  On 12/06/20 at 1:00pm EST by a video enabled telemedicine application and verified that I am speaking with the correct person using two identifiers.   I discussed the limitations of evaluation and management by telemedicine and the availability of in person appointments. The patient expressed understanding and agreed to proceed.  LOCATION: Patient:Home Provider: Home Office  History of Present Illness: Pt was referred to therapy by her psychiatrist at [facility] for anxiety and panic attacks.   Participation Level: Active   Type of Therapy: Virtual Video individual therapy  Treatment Goals addressed: Improve psychiatric symptoms, Controlled Behavior, Moderated Mood, Improve Unhelpful Thought Patterns, Emotional Regulation Skills (Moderate moods, anger management, stress management), Feel and express a full Range of Emotions, Learn about Diagnosis, Healthy Coping Skills.  Interventions: CBT/Supportive/Psychoeducation  Summary: Patient presented for today's session on time and was alert, oriented x5, with no evidence or self-report of SI/HI or A/V H.  Patient reported ongoing compliance with medication.  Clinician inquired about patient's current emotional ratings, as well as any significant changes in thoughts, feelings or behavior since previous session. Today 's appointment  was a family session with patient and her mother, where there is a contentious relationship. Cln provided a safe platform for patient and her mother to begin a conversation to move to reunification of relationship. Discussed next steps.     Assessment and plan: Counselor will continue to meet with patient to address treatment plan goals. Patient will continue to follow recommendations of providers and implement skills learned in session.  Suicidal/Homicidal: Nowithout intent/plan  Therapist Response: Assessed pt's current functioning and reviewed progress..  Assisted pt processing stressors, negative thoughts.  Assisted pt processing for the management of her stressors.  Participation Level: Active  Diagnosis: Axis I.:  GAD    Follow Up Instructions: I discussed the assessment and treatment plan with the patient. The patient was provided an opportunity to ask questions and all were answered. The patient agreed with the plan and demonstrated an understanding of the instructions.   The patient was advised to call back or seek an in-person evaluation if the symptoms worsen or if the condition fails to improve as anticipated.  I provided 60 minutes of non-face-to-face time during this encounter.   Charna Neeb S, LCAS

## 2020-12-09 ENCOUNTER — Ambulatory Visit (INDEPENDENT_AMBULATORY_CARE_PROVIDER_SITE_OTHER): Payer: No Typology Code available for payment source | Admitting: Licensed Clinical Social Worker

## 2020-12-09 ENCOUNTER — Encounter (HOSPITAL_COMMUNITY): Payer: Self-pay | Admitting: Licensed Clinical Social Worker

## 2020-12-09 ENCOUNTER — Other Ambulatory Visit: Payer: Self-pay

## 2020-12-09 DIAGNOSIS — F411 Generalized anxiety disorder: Secondary | ICD-10-CM | POA: Diagnosis not present

## 2020-12-09 NOTE — Progress Notes (Signed)
Virtual Visit via Video Note  I connected with Cindy Jones  On 12/09/20 at 1:00pm EST by a video enabled telemedicine application and verified that I am speaking with the correct person using two identifiers.   I discussed the limitations of evaluation and management by telemedicine and the availability of in person appointments. The patient expressed understanding and agreed to proceed.  LOCATION: Patient:Home Provider: Home Office  History of Present Illness: Pt was referred to therapy by her psychiatrist at [facility] for anxiety and panic attacks.   Participation Level: Active   Type of Therapy: Virtual Video individual therapy  Treatment Goals addressed: Improve psychiatric symptoms, Controlled Behavior, Moderated Mood, Improve Unhelpful Thought Patterns, Emotional Regulation Skills (Moderate moods, anger management, stress management), Feel and express a full Range of Emotions, Learn about Diagnosis, Healthy Coping Skills.  Interventions: CBT/Supportive/Psychoeducation  Summary: Patient presented for today's session on time and was alert, oriented x5, with no evidence or self-report of SI/HI or A/V H.   Clinician inquired about patient's current emotional ratings, as well as any significant changes in thoughts, feelings or behavior since previous session.  Patient's ratings  3/10 for depression, 4/10 for anxiety, 3/10 for anger/irritability, and denied any reoccurrence of panic attacks. Clinician inquired about patient's current emotional ratings, as well as any significant changes in thoughts, feelings or behavior since previous session. Today 's appointment  was the 2nd this week after the family session with patient and her mother. I wanted to to go over the family session, earlier in the week. Clinician utilized CBT to reflect and summarize thoughts and feelings about the family session and next steps.     Assessment and plan: Counselor will continue to meet with patient to address  treatment plan goals. Patient will continue to follow recommendations of providers and implement skills learned in session.  Suicidal/Homicidal: Nowithout intent/plan  Therapist Response: Assessed pt's current functioning and reviewed progress.. Assisted pt processing stressors, negative thoughts.  Assisted pt processing for the management of her stressors.  Participation Level: Active  Diagnosis: Axis I.:  GAD    Follow Up Instructions: I discussed the assessment and treatment plan with the patient. The patient was provided an opportunity to ask questions and all were answered. The patient agreed with the plan and demonstrated an understanding of the instructions.   The patient was advised to call back or seek an in-person evaluation if the symptoms worsen or if the condition fails to improve as anticipated.  I provided 60 minutes of non-face-to-face time during this encounter.   Audryanna Zurita S, LCAS

## 2020-12-10 ENCOUNTER — Other Ambulatory Visit (HOSPITAL_COMMUNITY): Payer: Self-pay

## 2020-12-13 ENCOUNTER — Other Ambulatory Visit: Payer: Self-pay

## 2020-12-13 ENCOUNTER — Other Ambulatory Visit (HOSPITAL_COMMUNITY): Payer: Self-pay

## 2020-12-13 ENCOUNTER — Encounter (HOSPITAL_COMMUNITY): Payer: Self-pay | Admitting: Licensed Clinical Social Worker

## 2020-12-13 ENCOUNTER — Ambulatory Visit (INDEPENDENT_AMBULATORY_CARE_PROVIDER_SITE_OTHER): Payer: No Typology Code available for payment source | Admitting: Licensed Clinical Social Worker

## 2020-12-13 DIAGNOSIS — F411 Generalized anxiety disorder: Secondary | ICD-10-CM

## 2020-12-13 NOTE — Progress Notes (Signed)
Virtual Visit via Video Note  I connected with Cindy Jones  On 12/13/20 at 1:00pm EST by a video enabled telemedicine application and verified that I am speaking with the correct person using two identifiers.   I discussed the limitations of evaluation and management by telemedicine and the availability of in person appointments. The patient expressed understanding and agreed to proceed.  LOCATION: Patient:Home Provider: Home Office  History of Present Illness: Pt was referred to therapy by her psychiatrist at [facility] for anxiety and panic attacks.   Participation Level: Active   Type of Therapy: Virtual Video individual therapy  Treatment Goals addressed: Improve psychiatric symptoms, Controlled Behavior, Moderated Mood, Improve Unhelpful Thought Patterns, Emotional Regulation Skills (Moderate moods, anger management, stress management), Feel and express a full Range of Emotions, Learn about Diagnosis, Healthy Coping Skills.  Interventions: CBT/Supportive/Psychoeducation  Summary: Patient presented for today's session on time and was alert, oriented x5, with no evidence or self-report of SI/HI or A/V H.   Clinician inquired about patient's current emotional ratings, as well as any significant changes in thoughts, feelings or behavior since previous session.  Patient's ratings  3/10 for depression, 5/10 for anxiety, 4/10 for anger/irritability, and denied any reoccurrence of panic attacks. Clinician inquired about patient's current emotional ratings, as well as any significant changes in thoughts, feelings or behavior since previous session. Patient reports "My anxiety has increased during the last week due to my conversation with my mother, which increased my poor eating habits." Cln and pt explored mindfulness skills: Clarifying, setting, and reaffirming intentions,cultivating a witnessing awareness, strengthening self-regulation, stabilizing attention, practicing loving-kindness. Cln and pt  explored next steps for opening up additional conversations with mother.      Assessment and plan: Counselor will continue to meet with patient to address treatment plan goals. Patient will continue to follow recommendations of providers and implement skills learned in session.  Suicidal/Homicidal: Nowithout intent/plan  Therapist Response: Assessed pt's current functioning and reviewed progress.. Assisted pt processing stressors, negative thoughts.  Assisted pt processing for the management of her stressors.  Participation Level: Active  Diagnosis: Axis I.:  GAD    Follow Up Instructions: I discussed the assessment and treatment plan with the patient. The patient was provided an opportunity to ask questions and all were answered. The patient agreed with the plan and demonstrated an understanding of the instructions.   The patient was advised to call back or seek an in-person evaluation if the symptoms worsen or if the condition fails to improve as anticipated.  I provided 60 minutes of non-face-to-face time during this encounter.   Izak Anding S, LCAS

## 2020-12-15 ENCOUNTER — Other Ambulatory Visit (HOSPITAL_COMMUNITY): Payer: Self-pay

## 2020-12-17 ENCOUNTER — Other Ambulatory Visit (HOSPITAL_COMMUNITY): Payer: Self-pay

## 2020-12-20 ENCOUNTER — Encounter (HOSPITAL_COMMUNITY): Payer: Self-pay | Admitting: Licensed Clinical Social Worker

## 2020-12-20 ENCOUNTER — Other Ambulatory Visit (HOSPITAL_COMMUNITY): Payer: Self-pay

## 2020-12-20 ENCOUNTER — Other Ambulatory Visit: Payer: Self-pay

## 2020-12-20 ENCOUNTER — Ambulatory Visit (INDEPENDENT_AMBULATORY_CARE_PROVIDER_SITE_OTHER): Payer: No Typology Code available for payment source | Admitting: Licensed Clinical Social Worker

## 2020-12-20 DIAGNOSIS — F411 Generalized anxiety disorder: Secondary | ICD-10-CM | POA: Diagnosis not present

## 2020-12-20 MED ORDER — RIZATRIPTAN BENZOATE 10 MG PO TBDP
ORAL_TABLET | ORAL | 5 refills | Status: DC
Start: 2020-12-20 — End: 2021-01-31
  Filled 2020-12-20: qty 10, 30d supply, fill #0

## 2020-12-20 NOTE — Progress Notes (Signed)
Refill for Rizatriptan per pt sent to [facility] outpatient pharmacy.

## 2020-12-20 NOTE — Progress Notes (Signed)
Virtual Visit via Video Note  I connected with Cindy Jones  On 12/13/20 at 1:00pm EST by a video enabled telemedicine application and verified that I am speaking with the correct person using two identifiers.   I discussed the limitations of evaluation and management by telemedicine and the availability of in person appointments. The patient expressed understanding and agreed to proceed.  LOCATION: Patient:Home Provider: Home Office  History of Present Illness: Pt was referred to therapy by her psychiatrist at [facility] for anxiety and panic attacks.   Participation Level: Active   Type of Therapy: Virtual Video individual therapy  Treatment Goals addressed: Improve psychiatric symptoms, Controlled Behavior, Moderated Mood, Improve Unhelpful Thought Patterns, Emotional Regulation Skills (Moderate moods, anger management, stress management), Feel and express a full Range of Emotions, Learn about Diagnosis, Healthy Coping Skills.  Interventions: CBT/Supportive/Psychoeducation  Summary: Patient presented for today's session on time and was alert, oriented x5, with no evidence or self-report of SI/HI or A/V H.   Clinician inquired about patient's current emotional ratings, as well as any significant changes in thoughts, feelings or behavior since previous session.  Patient's ratings  3/10 for depression, 5/10 for anxiety, 4/10 for anger/irritability, and denied any reoccurrence of panic attacks. Clinician inquired about patient's current emotional ratings, as well as any significant changes in thoughts, feelings or behavior since previous session. Patient reports "My anxiety has increased due to drama in my family." Pt's twin sister is an addict and has been stealing adderall from pt and other family members. Cln provided education on enabling, boundaries, addiction, treatment. Pt was tearful in describing he feelings about her sister and wanted to discuss how to confront her. Cln and pt explored  confronting without being confrontational, role played possible scenarios.       Assessment and plan: Counselor will continue to meet with patient to address treatment plan goals. Patient will continue to follow recommendations of providers and implement skills learned in session.  Suicidal/Homicidal: Nowithout intent/plan  Therapist Response: Assessed pt's current functioning and reviewed progress.. Assisted pt processing stressors, negative thoughts.  Assisted pt processing for the management of her stressors.  Participation Level: Active  Diagnosis: Axis I.:  GAD    Follow Up Instructions: I discussed the assessment and treatment plan with the patient. The patient was provided an opportunity to ask questions and all were answered. The patient agreed with the plan and demonstrated an understanding of the instructions.   The patient was advised to call back or seek an in-person evaluation if the symptoms worsen or if the condition fails to improve as anticipated.  I provided 60 minutes of non-face-to-face time during this encounter.   Alissah Redmon S, LCAS

## 2020-12-21 ENCOUNTER — Other Ambulatory Visit (HOSPITAL_COMMUNITY): Payer: Self-pay

## 2020-12-22 ENCOUNTER — Other Ambulatory Visit (HOSPITAL_COMMUNITY): Payer: Self-pay

## 2020-12-24 ENCOUNTER — Other Ambulatory Visit (HOSPITAL_COMMUNITY): Payer: Self-pay

## 2020-12-27 ENCOUNTER — Encounter (HOSPITAL_COMMUNITY): Payer: Self-pay | Admitting: Licensed Clinical Social Worker

## 2020-12-27 ENCOUNTER — Ambulatory Visit (INDEPENDENT_AMBULATORY_CARE_PROVIDER_SITE_OTHER): Payer: No Typology Code available for payment source | Admitting: Licensed Clinical Social Worker

## 2020-12-27 ENCOUNTER — Other Ambulatory Visit: Payer: Self-pay

## 2020-12-27 DIAGNOSIS — F411 Generalized anxiety disorder: Secondary | ICD-10-CM

## 2020-12-27 NOTE — Progress Notes (Signed)
Virtual Visit via Video Note  I connected with Cindy Jones  On 12/27/20 at 1:00pm EST by a video enabled telemedicine application and verified that I am speaking with the correct person using two identifiers.   I discussed the limitations of evaluation and management by telemedicine and the availability of in person appointments. The patient expressed understanding and agreed to proceed.  LOCATION: Patient:Home Provider: Home Office  History of Present Illness: Pt was referred to therapy by her psychiatrist at [facility] for anxiety and panic attacks.   Participation Level: Active   Type of Therapy: Virtual Video individual therapy  Treatment Goals addressed: Improve psychiatric symptoms, Controlled Behavior, Moderated Mood, Improve Unhelpful Thought Patterns, Emotional Regulation Skills (Moderate moods, anger management, stress management), Feel and express a full Range of Emotions, Learn about Diagnosis, Healthy Coping Skills.  Interventions: CBT/Supportive/Psychoeducation  Summary: Patient presented for today's session on time and was alert, oriented x5, with no evidence or self-report of SI/HI or A/V H.   Clinician inquired about patient's current emotional ratings, as well as any significant changes in thoughts, feelings or behavior since previous session.  Patient's ratings  3/10 for depression, 5/10 for anxiety, 4/10 for anger/irritability, and denied any reoccurrence of panic attacks. Clinician inquired about patient's current emotional ratings, as well as any significant changes in thoughts, feelings or behavior since previous session. Patient reports "My anxiety has not increased within the past week. I have chosen to limit access from family members due to family drama." Cln and pt explored pt's twin sister who is exhibiting addict behavior. Again, Cln provided education on enabling, al-a-non, addiction. Cln suggested pt visit virtual al-a-non meeting and the benefits.        Assessment and plan: Counselor will continue to meet with patient to address treatment plan goals. Patient will continue to follow recommendations of providers and implement skills learned in session.  Suicidal/Homicidal: Nowithout intent/plan  Therapist Response: Assessed pt's current functioning and reviewed progress.. Assisted pt processing stressors, negative thoughts.  Assisted pt processing for the management of her stressors.  Participation Level: Active  Diagnosis: Axis I.:  GAD    Follow Up Instructions: I discussed the assessment and treatment plan with the patient. The patient was provided an opportunity to ask questions and all were answered. The patient agreed with the plan and demonstrated an understanding of the instructions.   The patient was advised to call back or seek an in-person evaluation if the symptoms worsen or if the condition fails to improve as anticipated.  I provided 60 minutes of non-face-to-face time during this encounter.   Gursimran Litaker S, LCAS

## 2020-12-29 ENCOUNTER — Other Ambulatory Visit (HOSPITAL_COMMUNITY): Payer: Self-pay

## 2021-01-04 ENCOUNTER — Other Ambulatory Visit (HOSPITAL_COMMUNITY): Payer: Self-pay

## 2021-01-05 ENCOUNTER — Encounter (HOSPITAL_COMMUNITY): Payer: Self-pay | Admitting: Licensed Clinical Social Worker

## 2021-01-05 ENCOUNTER — Ambulatory Visit (INDEPENDENT_AMBULATORY_CARE_PROVIDER_SITE_OTHER): Payer: No Typology Code available for payment source | Admitting: Licensed Clinical Social Worker

## 2021-01-05 ENCOUNTER — Other Ambulatory Visit: Payer: Self-pay

## 2021-01-05 DIAGNOSIS — F411 Generalized anxiety disorder: Secondary | ICD-10-CM | POA: Diagnosis not present

## 2021-01-05 NOTE — Progress Notes (Signed)
Virtual Visit via Video Note  I connected with Cindy Jones  On 01/05/21 at 10:00am EST by a video enabled telemedicine application and verified that I am speaking with the correct person using two identifiers.   I discussed the limitations of evaluation and management by telemedicine and the availability of in person appointments. The patient expressed understanding and agreed to proceed.  LOCATION: Patient:Home Provider: Home Office  History of Present Illness: Pt was referred to therapy by her psychiatrist at [facility] for anxiety and panic attacks.   Participation Level: Active   Type of Therapy: Virtual Video individual therapy  Treatment Goals addressed: Improve psychiatric symptoms, Controlled Behavior, Moderated Mood, Improve Unhelpful Thought Patterns, Emotional Regulation Skills (Moderate moods, anger management, stress management), Feel and express a full Range of Emotions, Learn about Diagnosis, Healthy Coping Skills.  Interventions: CBT/Supportive/Psychoeducation  Summary: Patient presented for today's session on time and was alert, oriented x5, with no evidence or self-report of SI/HI or A/V H.    Clinician inquired about patient's current emotional ratings, as well as any significant changes in thoughts, feelings or behavior since previous session. Patient's ratings  2/10 for depression, 4/10 for anxiety, 4/10 for anger/irritability, and denied any reoccurrence of panic attacks. Patient reports "My anxiety has been mixed within the past week. We went to the beach over the holiday weekend with friends but I had PMS symptoms, so my emotions were mixed." Cln encouraged pt to use positive behavioral responses to stressful and challenging situations. Cln explored effective coping techniques.    Assessment and plan: Counselor will continue to meet with patient to address treatment plan goals. Patient will continue to follow recommendations of providers and implement skills learned in  session.  Suicidal/Homicidal: Nowithout intent/plan  Therapist Response: Assessed pt's current functioning and reviewed progress.. Assisted pt processing stressors, negative thoughts.  Assisted pt processing for the management of her stressors.  Participation Level: Active  Diagnosis: Axis I.:  GAD    Follow Up Instructions: I discussed the assessment and treatment plan with the patient. The patient was provided an opportunity to ask questions and all were answered. The patient agreed with the plan and demonstrated an understanding of the instructions.   The patient was advised to call back or seek an in-person evaluation if the symptoms worsen or if the condition fails to improve as anticipated.  I provided 60 minutes of non-face-to-face time during this encounter.   Casimir Barcellos S, LCAS

## 2021-01-06 ENCOUNTER — Other Ambulatory Visit: Payer: Self-pay

## 2021-01-06 ENCOUNTER — Telehealth (INDEPENDENT_AMBULATORY_CARE_PROVIDER_SITE_OTHER): Payer: No Typology Code available for payment source | Admitting: Psychiatry

## 2021-01-06 ENCOUNTER — Other Ambulatory Visit (HOSPITAL_COMMUNITY): Payer: Self-pay

## 2021-01-06 DIAGNOSIS — F988 Other specified behavioral and emotional disorders with onset usually occurring in childhood and adolescence: Secondary | ICD-10-CM | POA: Diagnosis not present

## 2021-01-06 MED ORDER — AMPHETAMINE-DEXTROAMPHET ER 25 MG PO CP24
25.0000 mg | ORAL_CAPSULE | ORAL | 0 refills | Status: DC
  Filled 2021-01-06 – 2021-03-21 (×2): qty 30, 30d supply, fill #0

## 2021-01-06 MED ORDER — AMPHETAMINE-DEXTROAMPHET ER 25 MG PO CP24
25.0000 mg | ORAL_CAPSULE | ORAL | 0 refills | Status: DC
  Filled 2021-01-06 – 2021-01-18 (×2): qty 30, 30d supply, fill #0

## 2021-01-06 MED ORDER — AMPHETAMINE-DEXTROAMPHETAMINE 10 MG PO TABS
10.0000 mg | ORAL_TABLET | Freq: Every day | ORAL | 0 refills | Status: DC
Start: 2021-01-06 — End: 2021-04-07
  Filled 2021-01-06 – 2021-03-21 (×2): qty 30, 30d supply, fill #0

## 2021-01-06 MED ORDER — AMPHETAMINE-DEXTROAMPHET ER 25 MG PO CP24
25.0000 mg | ORAL_CAPSULE | ORAL | 0 refills | Status: DC
  Filled 2021-01-06 – 2021-02-17 (×2): qty 30, 30d supply, fill #0

## 2021-01-06 MED ORDER — AMPHETAMINE-DEXTROAMPHETAMINE 10 MG PO TABS
10.0000 mg | ORAL_TABLET | Freq: Every day | ORAL | 0 refills | Status: DC
  Filled 2021-01-06 – 2021-01-18 (×2): qty 30, 30d supply, fill #0

## 2021-01-06 NOTE — Progress Notes (Signed)
Virtual Visit via Video Note  I connected with [name] on 01/06/21 at 11:30 AM EDT by a video enabled telemedicine application and verified that I am speaking with the correct person using two identifiers.  Location: Patient: home Provider: office   I discussed the limitations of evaluation and management by telemedicine and the availability of in person appointments. The patient expressed understanding and agreed to proceed.  History of Present Illness: Bibiana is back in school. She has 2 classes at the same time and doesn't know what to do. She failed her 1st quiz despite reviewing the material. Her ADHD is well controlled. 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 She does better with hands on work. Her anxiety was well controlled until she failed her quiz. Her anxiety is still manageable despite the recent stressors. Her tics are unchanged. She denies any panic attacks. Her sleep and appetite are good. She denies SI/HI. [place] is in talks about getting a new job. She is anxious about a skill test she has to do for the job. [place]ven[place] found mold in her crawl space and they are dealing with getting it fixed.    Observations/Objective: Psychiatric Specialty Exam: ROS  There were no vitals taken for this visit.There is no height or weight on file to calculate BMI.  General Appearance: Neat and Well Groomed  Eye Contact:  Good  Speech:  Clear and Coherent and Normal Rate  Volume:  Normal  Mood:  Anxious  Affect:  Full Range  Thought Process:  Goal Directed, Linear, and Descriptions of Associations: Intact  Orientation:  Full (Time, Place, and Person)  Thought Content:  Logical  Suicidal Thoughts:  No  Homicidal Thoughts:  No  Memory:  Immediate;   Good  Judgement:  Good  Insight:  Good  Psychomotor Activity:  Normal  Concentration:  Concentration: Good  Recall:  Good  Fund of Knowledge:  Good  Language:  Good  Akathisia:  No  Handed:  Right  AIMS  (if indicated):     Assets:  Communication Skills Desire for Improvement Financial Resources/Insurance Housing Intimacy Leisure Time Physical Health Resilience Social Support Talents/Skills Transportation Vocational/Educational  ADL's:  Intact  Cognition:  WNL  Sleep:        Assessment and Plan: 1. ADD (attention deficit disorder) without hyperactivity - amphetamine-dextroamphetamine (ADDERALL XR) 25 MG 24 hr capsule; Take 1 capsule by mouth every morning.  Dispense: 30 capsule; Refill: 0 - amphetamine-dextroamphetamine (ADDERALL XR) 25 MG 24 hr capsule; Take 1 capsule by mouth every morning.  **Fill after 58 days  Dispense: 30 capsule; Refill: 0 - amphetamine-dextroamphetamine (ADDERALL XR) 25 MG 24 hr capsule; Take 1 capsule by mouth every morning.  **Fill after 28 days  Dispense: 30 capsule; Refill: 0 - amphetamine-dextroamphetamine (ADDERALL) 10 MG tablet; Take 1 tablet (10 mg total) by mouth daily.  Dispense: 30 tablet; Refill: 0 - amphetamine-dextroamphetamine (ADDERALL) 10 MG tablet; Take 1 tablet (10 mg total) by mouth daily.  **Fill after 28 days  Dispense: 30 tablet; Refill: 0   Follow Up Instructions: In 3 months or sooner if needed   I discussed the assessment and treatment plan with the patient. The patient was provided an opportunity to ask questions and all were answered. The patient agreed with the plan and demonstrated an understanding of the instructions.   The patient was advised to call back or seek an in-person evaluation if the symptoms worsen or if the condition fails to improve as  anticipated.    Oletta Darter, MD

## 2021-01-07 ENCOUNTER — Other Ambulatory Visit (HOSPITAL_COMMUNITY): Payer: Self-pay

## 2021-01-10 ENCOUNTER — Other Ambulatory Visit: Payer: Self-pay

## 2021-01-10 ENCOUNTER — Ambulatory Visit (INDEPENDENT_AMBULATORY_CARE_PROVIDER_SITE_OTHER): Payer: No Typology Code available for payment source | Admitting: Licensed Clinical Social Worker

## 2021-01-10 ENCOUNTER — Encounter (HOSPITAL_COMMUNITY): Payer: Self-pay | Admitting: Licensed Clinical Social Worker

## 2021-01-10 DIAGNOSIS — F411 Generalized anxiety disorder: Secondary | ICD-10-CM | POA: Diagnosis not present

## 2021-01-13 NOTE — Progress Notes (Signed)
Virtual Visit via Video Note  I connected with Cindy Jones  On 01/10/21 at 1:00pm EST by a video enabled telemedicine application and verified that I am speaking with the correct person using two identifiers.   I discussed the limitations of evaluation and management by telemedicine and the availability of in person appointments. The patient expressed understanding and agreed to proceed.  LOCATION: Patient:Home Provider: Home Office  History of Present Illness: Pt was referred to therapy by her psychiatrist at [facility] for anxiety and panic attacks.   Participation Level: Active   Type of Therapy: Virtual Video individual therapy  Treatment Goals addressed: Improve psychiatric symptoms, Controlled Behavior, Moderated Mood, Improve Unhelpful Thought Patterns, Emotional Regulation Skills (Moderate moods, anger management, stress management), Feel and express a full Range of Emotions, Learn about Diagnosis, Healthy Coping Skills.  Interventions: CBT/Supportive/Psychoeducation  Summary: Patient presented for today's session on time and was alert, oriented x5, with no evidence or self-report of SI/HI or A/V H.    Clinician inquired about patient's current emotional ratings, as well as any significant changes in thoughts, feelings or behavior since previous session. Patient's ratings  2/10 for depression, 4/10 for anxiety, 4/10 for anger/irritability, and denied any reoccurrence of panic attacks. Patient reports "My anxiety has been mixed within the past week. I asked my mother to join our session today and she had an excuse. I don't understand why she doesn't want to have a conversation." Clinician utilized CBT to address thought processes. Clinician provided thought stopping tools, as well as reality testing to provide support and confidence. Pt discussed her problems at school. Clinician utilized MI OARS to affirm her concerns. Clinician challenged thoughts about this. Clinician processed options for  communicating her concerns with the professor.    Assessment and plan: Counselor will continue to meet with patient to address treatment plan goals. Patient will continue to follow recommendations of providers and implement skills learned in session.  Suicidal/Homicidal: Nowithout intent/plan  Therapist Response: Assessed pt's current functioning and reviewed progress.. Assisted pt processing stressors, negative thoughts.  Assisted pt processing for the management of her stressors.  Participation Level: Active  Diagnosis: Axis I.:  GAD    Follow Up Instructions: I discussed the assessment and treatment plan with the patient. The patient was provided an opportunity to ask questions and all were answered. The patient agreed with the plan and demonstrated an understanding of the instructions.   The patient was advised to call back or seek an in-person evaluation if the symptoms worsen or if the condition fails to improve as anticipated.  I provided 60 minutes of non-face-to-face time during this encounter.   Ramces Shomaker S, LCAS

## 2021-01-17 ENCOUNTER — Ambulatory Visit (INDEPENDENT_AMBULATORY_CARE_PROVIDER_SITE_OTHER): Payer: No Typology Code available for payment source | Admitting: Licensed Clinical Social Worker

## 2021-01-17 ENCOUNTER — Other Ambulatory Visit: Payer: Self-pay

## 2021-01-17 ENCOUNTER — Other Ambulatory Visit (HOSPITAL_COMMUNITY): Payer: Self-pay

## 2021-01-17 ENCOUNTER — Encounter (HOSPITAL_COMMUNITY): Payer: Self-pay | Admitting: Licensed Clinical Social Worker

## 2021-01-17 DIAGNOSIS — F411 Generalized anxiety disorder: Secondary | ICD-10-CM | POA: Diagnosis not present

## 2021-01-17 NOTE — Progress Notes (Signed)
Virtual Visit via Video Note  I connected with Cindy Jones on 01/17/21 at 1:00pm EST by a video enabled telemedicine application and verified that I am speaking with the correct person using two identifiers.   I discussed the limitations of evaluation and management by telemedicine and the availability of in person appointments. The patient expressed understanding and agreed to proceed.  LOCATION: Patient:Home Provider: Home Office  History of Present Illness: Pt was referred to therapy by her psychiatrist at [facility] for anxiety and panic attacks.   Participation Level: Active   Type of Therapy: Virtual Video individual therapy  Treatment Goals addressed: Improve psychiatric symptoms, Controlled Behavior, Moderated Mood, Improve Unhelpful Thought Patterns, Emotional Regulation Skills (Moderate moods, anger management, stress management), Feel and express a full Range of Emotions, Learn about Diagnosis, Healthy Coping Skills.  Interventions: CBT/Supportive/Psychoeducation  Summary: Patient presented for today's session on time and was alert, oriented x5, with no evidence or self-report of SI/HI or A/V H.    Clinician inquired about patient's current emotional ratings, as well as any significant changes in thoughts, feelings or behavior since previous session. Patient's ratings  2/10 for depression, 5/10 for anxiety, 2/10 for anger/irritability, and denied any reoccurrence of panic attacks. Patient reports "My anxiety has been mixed within the past week. 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 Cln used exposure therapy for patient to feel her emotions of being abandoned by most everyone in her life, beginning with her father dying at a young age, up until she was out of college leaning to live on her own. Cln was tearful in sharing her  memories, thoughts and feelings. Cln validated her feelings, and used CBT to address her current thought processes.  Plan: retraining your brain    Assessment and plan: Counselor will continue to meet with patient to address treatment plan goals. Patient will continue to follow recommendations of providers and implement skills learned in session.  Suicidal/Homicidal: Nowithout intent/plan  Therapist Response: Assessed pt's current functioning and reviewed progress.. Assisted pt processing stressors, negative thoughts.  Assisted pt processing for the management of her stressors.  Participation Level: Active  Diagnosis: Axis I.:  GAD    Follow Up Instructions: I discussed the assessment and treatment plan with the patient. The patient was provided an opportunity to ask questions and all were answered. The patient agreed with the plan and demonstrated an understanding of the instructions.   The patient was advised to call back or seek an in-person evaluation if the symptoms worsen or if the condition fails to improve as anticipated.  I provided 60 minutes of non-face-to-face time during this encounter.   Alec Mcphee S, LCAS

## 2021-01-18 ENCOUNTER — Other Ambulatory Visit (HOSPITAL_COMMUNITY): Payer: Self-pay

## 2021-01-24 ENCOUNTER — Ambulatory Visit (HOSPITAL_COMMUNITY): Payer: No Typology Code available for payment source | Admitting: Licensed Clinical Social Worker

## 2021-01-28 NOTE — Progress Notes (Signed)
NEUROLOGY FOLLOW UP OFFICE NOTE  [name] [id number]  Assessment/Plan:   Migraine with aura, without status migrainosus, intractable  Defers preventative medication as she plans to start trying to get pregnant in December.  Will take magnesium citrate 400mg  daily, riboflavin 400mg  daily and CoQ10 100mg  three times daily For migraine rescue, try sumatriptan 20mg  NS.  Zofran for nausea Limit use of pain relievers to no more than 2 days out of week to prevent risk of rebound or medication-overuse headache. Keep headache diary Follow up 6 months.  Subjective:  Cindy Jones is a 30 year old right-handed female with Tourette disorder, ADHD, generalized anxiety disorder and possible POTS who follows up for migraines.  UPDATE: Intensity:  severe Duration:  1 to 2 days Frequency:  5 migraines (3 were back to back) - total 8 headache days in September Frequency of abortive medication: 8 days in September Rescue protocol: benadryl (Maxalt ineffective)  Planning to try getting pregnant in December.  Current NSAIDS/analgesics:  none Current triptans:  Maxalt-MLT 10mg  Current ergotamine:  none Current anti-emetic:  Zofran ODT 4mg  Current muscle relaxants:  none Current Antihypertensive medications:  none Current Antidepressant medications:  none Current Anticonvulsant medications:  none Current anti-CGRP:  none Current Vitamins/Herbal/Supplements:  magnesium citrate 400mg  daily, B12, D Current Antihistamines/Decongestants:  Benadryl Other therapy:  none Hormone/birth control:  none Other medications:  none  Caffeine:  Was drinking 3-4 cups coffee daily but now only 1 cup daily since January Diet:  80 to 100 oz water daily.  May skip breakfast Exercise:  Walks dog daily.   Depression:  no; Anxiety:  Yes but controlled.  ADD.  In grad school for data analysis Other pain:  Tension in head Sleep:  Good.  May use melatonin  HISTORY:  She reports history of migraines since  high school. Since early 2019, she started having preceding auras described as running water over her eyes lasting 10-15 minutes.  They are severe throbbing pain located either side of head, bilateral, top or back of head.  There is associated nausea, photophobia, osmophobia and more recently vomiting.  Typically last all day.  Frequency varies, occurs 1-2 times a month but may have 1 to 3 months without any migraine.  She was seen in the ED for an intractable migraine of 4 days duration in January 2022.  CT head at that time personally reviewed was normal.  Triggers include stress, sometimes menstruation, and possibly eye twitching due to her tourette's.  Treats with Excedrin Migraine.   She also reports longstanding history of dizziness.  She feels lightheaded when she stands up in the morning.  She has passed out.  She has been evaluated by cardiology. No clear etiology.    Past NSAIDS/analgesics:  Ibuprofen, Tylenol with caffeine, Excedrin Past abortive triptans:  none Past abortive ergotamine:  none Past muscle relaxants:  none Past anti-emetic:  Zofran Past antihypertensive medications:  propranolol Past antidepressant medications:  Vyvanse Past anticonvulsant medications:  none Past anti-CGRP:  none Past vitamins/Herbal/Supplements:  melatonin Past antihistamines/decongestants:  Zyrtec, Flonase, Claritin, Sudafed Other past therapies:  none    Family history of headache:  Father (migraines).  No family history of aneurysms.    PAST MEDICAL HISTORY: Past Medical History:  Diagnosis Date   Anxiety    Asthma    as a child   Binge eating disorder    Dysrhythmia    " I feel it about once a month"   Frequent headaches  GAD (generalized anxiety disorder)    GERD (gastroesophageal reflux disease)    History of acute PID 06/09/2019   Major depression in complete remission (HCC)    Migraines    PID (acute pelvic inflammatory disease)    Seasonal allergies    Tourette disorder     Urinary tract bacterial infections     MEDICATIONS: Current Outpatient Medications on File Prior to Visit  Medication Sig Dispense Refill   albuterol (VENTOLIN HFA) 108 (90 Base) MCG/ACT inhaler Inhale 1-2 puffs into the lungs 15 minutes prior to exercise as directed. 18 g 5   amphetamine-dextroamphetamine (ADDERALL XR) 25 MG 24 hr capsule Take 1 capsule by mouth every morning. 30 capsule 0   amphetamine-dextroamphetamine (ADDERALL XR) 25 MG 24 hr capsule Take 1 capsule by mouth every morning.  **Fill after 58 days 30 capsule 0   amphetamine-dextroamphetamine (ADDERALL XR) 25 MG 24 hr capsule Take 1 capsule by mouth every morning.  **Fill after 28 days 30 capsule 0   amphetamine-dextroamphetamine (ADDERALL) 10 MG tablet Take 1 tablet (10 mg total) by mouth daily. 30 tablet 0   amphetamine-dextroamphetamine (ADDERALL) 10 MG tablet Take 1 tablet (10 mg total) by mouth daily.  **Fill after 28 days 30 tablet 0   cetirizine (ZYRTEC ALLERGY) 10 MG tablet Take 1 tablet (10 mg total) by mouth 2 (two) times daily. 60 tablet 3   clindamycin (CLEOCIN T) 1 % lotion Apply a small amount to skin once to twice daily as needed for flare 60 mL 5   Cyanocobalamin (VITAMIN B-12) 1000 MCG SUBL Place under the tongue.     ondansetron (ZOFRAN ODT) 4 MG disintegrating tablet Dissolve 1 tablet (4 mg total) by mouth every 8 (eight) hours as needed for nausea or vomiting. 20 tablet 5   rizatriptan (MAXALT-MLT) 10 MG disintegrating tablet Dissolve 1 tablet by mouth at earliest onset of migraine.  May repeat in 2 hours if needed.  Maximum of 2 tablets in 24 hours. 10 tablet 5   Vitamin D, Ergocalciferol, (DRISDOL) 1.25 MG (50000 UNIT) CAPS capsule Take 1 capsule by mouth every 7 days. 12 capsule 0   No current facility-administered medications on file prior to visit.    ALLERGIES: Allergies  Allergen Reactions   Latex Swelling and Rash   Barley Grass Hives   Cranberry Juice Powder Other (See Comments)    Throat  pain.   Naproxen Other (See Comments)    "Liver starts hurting" Diarrhea   Nickel     Skin peeling   Sulfa Antibiotics Hives and Swelling    FAMILY HISTORY: Family History  Problem Relation Age of Onset   Alcohol abuse Mother    Bipolar disorder Mother    Cancer Father        sarcoma; passed away when pt was 8   Tourette syndrome Maternal Aunt    Alcohol abuse Maternal Aunt    Hyperlipidemia Paternal Aunt    Hashimoto's thyroiditis Paternal Aunt    Tourette syndrome Maternal Grandfather    Heart disease Paternal Grandfather    Asthma Sister    Allergic rhinitis Sister    Eczema Sister    Alcohol abuse Maternal Uncle    Emphysema Maternal Grandmother    Emphysema Paternal Grandmother    Suicidality Neg Hx    Urticaria Neg Hx    Angioedema Neg Hx       Objective:  Blood pressure 119/77, pulse 86, height 5\' 6"  (1.676 m), weight 177 lb (80.3 kg), SpO2 100 %.  General: No acute distress.  Patient appears well-groomed.     Shon Millet, DO  CC: Felix Pacini, DO

## 2021-01-31 ENCOUNTER — Ambulatory Visit: Payer: No Typology Code available for payment source | Admitting: Neurology

## 2021-01-31 ENCOUNTER — Encounter (HOSPITAL_COMMUNITY): Payer: Self-pay | Admitting: Licensed Clinical Social Worker

## 2021-01-31 ENCOUNTER — Encounter: Payer: Self-pay | Admitting: Neurology

## 2021-01-31 ENCOUNTER — Other Ambulatory Visit: Payer: Self-pay

## 2021-01-31 ENCOUNTER — Other Ambulatory Visit (HOSPITAL_COMMUNITY): Payer: Self-pay

## 2021-01-31 ENCOUNTER — Ambulatory Visit (INDEPENDENT_AMBULATORY_CARE_PROVIDER_SITE_OTHER): Payer: No Typology Code available for payment source | Admitting: Licensed Clinical Social Worker

## 2021-01-31 VITALS — BP 119/77 | HR 86 | Ht 66.0 in | Wt 177.0 lb

## 2021-01-31 DIAGNOSIS — G43119 Migraine with aura, intractable, without status migrainosus: Secondary | ICD-10-CM | POA: Diagnosis not present

## 2021-01-31 DIAGNOSIS — F411 Generalized anxiety disorder: Secondary | ICD-10-CM | POA: Diagnosis not present

## 2021-01-31 MED ORDER — SUMATRIPTAN 20 MG/ACT NA SOLN
NASAL | 5 refills | Status: DC
  Filled 2021-01-31: qty 6, 30d supply, fill #0

## 2021-01-31 NOTE — Progress Notes (Signed)
Virtual Visit via Video Note  I connected with Cindy Jones on 01/31/21 at 1:00pm EST by a video enabled telemedicine application and verified that I am speaking with the correct person using two identifiers.   I discussed the limitations of evaluation and management by telemedicine and the availability of in person appointments. The patient expressed understanding and agreed to proceed.  LOCATION: Patient:Home Provider: Home Office  History of Present Illness: Pt was referred to therapy by her psychiatrist at [facility] for anxiety and panic attacks.   Participation Level: Active   Type of Therapy: Virtual Video individual therapy  Treatment Goals addressed: Improve psychiatric symptoms, Controlled Behavior, Moderated Mood, Improve Unhelpful Thought Patterns, Emotional Regulation Skills (Moderate moods, anger management, stress management), Feel and express a full Range of Emotions, Learn about Diagnosis, Healthy Coping Skills.  Interventions: CBT/Supportive/Psychoeducation  Summary: Patient presented for today's session on time and was alert, oriented x5, with no evidence or self-report of SI/HI or A/V H.    Clinician inquired about patient's current emotional ratings, as well as any significant changes in thoughts, feelings or behavior since previous session. Patient's ratings  2/10 for depression, 5/10 for anxiety, 2/10 for anger/irritability, and denied any reoccurrence of panic attacks. Patient reports "My anxiety has been mixed within the past week. i'm overwhelmed with school (mid-term exams.) Cln and pt explored coping skills for her anxiety. Pt saw her neurologist for migraines today and he switched her meds. Cln and pt explored her anxiety and anger, discussing her earliest memories of both - timelined to present, looking at both with a different perspective to give patient and new way of looking at problematic moods.   Plan: abandonment, I've never liked my mom  Plan: retraining your  brain    Assessment and plan: Counselor will continue to meet with patient to address treatment plan goals. Patient will continue to follow recommendations of providers and implement skills learned in session.  Suicidal/Homicidal: Nowithout intent/plan  Therapist Response: Assessed pt's current functioning and reviewed progress.. Assisted pt processing stressors, negative thoughts.  Assisted pt processing for the management of her stressors.  Participation Level: Active  Diagnosis: Axis I.:  GAD    Follow Up Instructions: I discussed the assessment and treatment plan with the patient. The patient was provided an opportunity to ask questions and all were answered. The patient agreed with the plan and demonstrated an understanding of the instructions.   The patient was advised to call back or seek an in-person evaluation if the symptoms worsen or if the condition fails to improve as anticipated.  I provided 60 minutes of non-face-to-face time during this encounter.   Aidin Doane S, LCAS

## 2021-01-31 NOTE — Patient Instructions (Signed)
Stop rizatriptan.  Use sumatriptan nasal spray - take as directed.  Continue Zofran as needed. Magnesium citrate 400mg  daily, riboflavin 400mg  daily and CoQ10 100mg  three times daily Follow up 6 months.

## 2021-02-01 ENCOUNTER — Other Ambulatory Visit (HOSPITAL_COMMUNITY): Payer: Self-pay

## 2021-02-04 ENCOUNTER — Other Ambulatory Visit (HOSPITAL_COMMUNITY): Payer: Self-pay

## 2021-02-07 ENCOUNTER — Encounter (HOSPITAL_COMMUNITY): Payer: Self-pay | Admitting: Licensed Clinical Social Worker

## 2021-02-07 ENCOUNTER — Ambulatory Visit (INDEPENDENT_AMBULATORY_CARE_PROVIDER_SITE_OTHER): Payer: No Typology Code available for payment source | Admitting: Licensed Clinical Social Worker

## 2021-02-07 ENCOUNTER — Other Ambulatory Visit: Payer: Self-pay

## 2021-02-07 DIAGNOSIS — F411 Generalized anxiety disorder: Secondary | ICD-10-CM | POA: Diagnosis not present

## 2021-02-07 NOTE — Progress Notes (Signed)
Virtual Visit via Video Note  I connected with Cindy Jones on 02/07/21 at 1:00pm EST by a video enabled telemedicine application and verified that I am speaking with the correct person using two identifiers.   I discussed the limitations of evaluation and management by telemedicine and the availability of in person appointments. The patient expressed understanding and agreed to proceed.  LOCATION: Patient:Home Provider: Home Office  History of Present Illness: Pt was referred to therapy by her psychiatrist at [facility] for anxiety and panic attacks.   Participation Level: Active   Type of Therapy: Virtual Video individual therapy  Treatment Goals addressed: Improve psychiatric symptoms, Controlled Behavior, Moderated Mood, Improve Unhelpful Thought Patterns, Emotional Regulation Skills (Moderate moods, anger management, stress management), Feel and express a full Range of Emotions, Learn about Diagnosis, Healthy Coping Skills.  Interventions: CBT/Supportive/Psychoeducation  Summary: Patient presented for today's session on time and was alert, oriented x5, with no evidence or self-report of SI/HI or A/V H.    Clinician inquired about patient's current emotional ratings, as well as any significant changes in thoughts, feelings or behavior since previous session. Patient's ratings  1/10 for depression, 7/10 for anxiety, 2/10 for anger/irritability, and denied any reoccurrence of panic attacks. Patient reports "My anxiety has increased today: I got a new job, gave notice to my old job, became concerned about finishing grad school with a new job, thinking about putting off starting a family for a few months, developed a headache because of all of this due to my tourettes symptoms, husband starts NP school in January." Cln and pt explored coping skills for her anxiety. Clinician utilized MI OARS to reflect and summarize thoughts and feelings about this new normal life she is beginning to  experience.   Plan: abandonment, I've never liked my mom  Plan: retraining your brain    Assessment and plan: Counselor will continue to meet with patient to address treatment plan goals. Patient will continue to follow recommendations of providers and implement skills learned in session.  Suicidal/Homicidal: Nowithout intent/plan  Therapist Response: Assessed pt's current functioning and reviewed progress.. Assisted pt processing stressors, negative thoughts.  Assisted pt processing for the management of her stressors.  Participation Level: Active  Diagnosis: Axis I.:  GAD    Follow Up Instructions: I discussed the assessment and treatment plan with the patient. The patient was provided an opportunity to ask questions and all were answered. The patient agreed with the plan and demonstrated an understanding of the instructions.   The patient was advised to call back or seek an in-person evaluation if the symptoms worsen or if the condition fails to improve as anticipated.  I provided 60 minutes of non-face-to-face time during this encounter.   Landen Breeland S, LCAS

## 2021-02-14 ENCOUNTER — Ambulatory Visit (INDEPENDENT_AMBULATORY_CARE_PROVIDER_SITE_OTHER): Payer: No Typology Code available for payment source | Admitting: Licensed Clinical Social Worker

## 2021-02-14 ENCOUNTER — Encounter (HOSPITAL_COMMUNITY): Payer: Self-pay | Admitting: Licensed Clinical Social Worker

## 2021-02-14 ENCOUNTER — Other Ambulatory Visit: Payer: Self-pay

## 2021-02-14 DIAGNOSIS — F411 Generalized anxiety disorder: Secondary | ICD-10-CM | POA: Diagnosis not present

## 2021-02-14 NOTE — Progress Notes (Signed)
Virtual Visit via Video Note  I connected with Cindy Jones on 02/14/21 at 1:00pm EST by a video enabled telemedicine application and verified that I am speaking with the correct person using two identifiers.   I discussed the limitations of evaluation and management by telemedicine and the availability of in person appointments. The patient expressed understanding and agreed to proceed.  LOCATION: Patient:Home Provider: Home Office  History of Present Illness: Pt was referred to therapy by her psychiatrist at [facility] for anxiety and panic attacks.   Participation Level: Active   Type of Therapy: Virtual Video individual therapy  Treatment Goals addressed: Improve psychiatric symptoms, Controlled Behavior, Moderated Mood, Improve Unhelpful Thought Patterns, Emotional Regulation Skills (Moderate moods, anger management, stress management), Feel and express a full Range of Emotions, Learn about Diagnosis, Healthy Coping Skills.  Interventions: CBT/Supportive/Psychoeducation  Summary: Patient presented for today's session on time and was alert, oriented x5, with no evidence or self-report of SI/HI or A/V H.    Clinician inquired about patient's current emotional ratings, as well as any significant changes in thoughts, feelings or behavior since previous session. Patient's ratings  1/10 for depression, 5/10 for anxiety, 2/10 for anger/irritability, and denied any reoccurrence of panic attacks. Cln and pt explored emotional ratings and coping skills. Pt reports "I start my new job next Monday and my last day at old job is Thursday. Cln utilized MI OARS to summarize her thoughts and feelings of the new opportunity. Pt reports she and husband had a good discussion using effective communication skills about his moods. Cln and pt began a discussion on "I never liked my mom."    Plan: abandonment, I've never liked my mom  Plan: retraining your brain    Assessment and plan: Counselor will continue to  meet with patient to address treatment plan goals. Patient will continue to follow recommendations of providers and implement skills learned in session.  Suicidal/Homicidal: Nowithout intent/plan  Therapist Response: Assessed pt's current functioning and reviewed progress.. Assisted pt processing stressors, negative thoughts.  Assisted pt processing for the management of her stressors.  Participation Level: Active  Diagnosis: Axis I.:  GAD    Follow Up Instructions: I discussed the assessment and treatment plan with the patient. The patient was provided an opportunity to ask questions and all were answered. The patient agreed with the plan and demonstrated an understanding of the instructions.   The patient was advised to call back or seek an in-person evaluation if the symptoms worsen or if the condition fails to improve as anticipated.  I provided 60 minutes of non-face-to-face time during this encounter.   Ninnie Fein S, LCAS

## 2021-02-17 ENCOUNTER — Other Ambulatory Visit (HOSPITAL_COMMUNITY): Payer: Self-pay

## 2021-02-21 ENCOUNTER — Ambulatory Visit (HOSPITAL_COMMUNITY): Payer: No Typology Code available for payment source | Admitting: Licensed Clinical Social Worker

## 2021-02-28 ENCOUNTER — Ambulatory Visit (INDEPENDENT_AMBULATORY_CARE_PROVIDER_SITE_OTHER): Payer: No Typology Code available for payment source | Admitting: Licensed Clinical Social Worker

## 2021-02-28 ENCOUNTER — Encounter (HOSPITAL_COMMUNITY): Payer: Self-pay | Admitting: Licensed Clinical Social Worker

## 2021-02-28 ENCOUNTER — Other Ambulatory Visit: Payer: Self-pay

## 2021-02-28 DIAGNOSIS — F411 Generalized anxiety disorder: Secondary | ICD-10-CM | POA: Diagnosis not present

## 2021-02-28 NOTE — Progress Notes (Signed)
Virtual Visit via Video Note  I connected with Cindy Jones on 02/28/21 at 1:00pm EST by a video enabled telemedicine application and verified that I am speaking with the correct person using two identifiers.   I discussed the limitations of evaluation and management by telemedicine and the availability of in person appointments. The patient expressed understanding and agreed to proceed.  LOCATION: Patient:Home Provider: Home Office  History of Present Illness: Pt was referred to therapy by her psychiatrist at [facility] for anxiety and panic attacks.   Participation Level: Active   Type of Therapy: Virtual Video individual therapy  Treatment Goals addressed: Improve psychiatric symptoms, Controlled Behavior, Moderated Mood, Improve Unhelpful Thought Patterns, Emotional Regulation Skills (Moderate moods, anger management, stress management), Feel and express a full Range of Emotions, Learn about Diagnosis, Healthy Coping Skills.  Interventions: CBT/Supportive/Psychoeducation  Summary: Patient presented for today's session on time and was alert, oriented x5, with no evidence or self-report of SI/HI or A/V H.    Clinician inquired about patient's current emotional ratings, as well as any significant changes in thoughts, feelings or behavior since previous session. Patient's ratings  1/10 for depression, 5/10 for anxiety, 5/10 for anger/irritability, and denied any reoccurrence of panic attacks. Cln and pt explored emotional ratings and coping skills. Pt reports "I started my new job last Monday and  I like it, it challenges me." Pt reports on challenges she and her husband are having in their relationship. Cln provided psychoeducation on dynamics of healthy vs unhealthy relationships associated with power and control. Cln validated and normalized patient's feelings while redirecting her focus to things that she can control. Cln suggested she use her effective communication skills, using listening and  I-statements.     Plan: abandonment, I've never liked my mom  Plan: retraining your brain    Assessment and plan: Counselor will continue to meet with patient to address treatment plan goals. Patient will continue to follow recommendations of providers and implement skills learned in session.  Suicidal/Homicidal: Nowithout intent/plan  Therapist Response: Assessed pt's current functioning and reviewed progress.. Assisted pt processing stressors, negative thoughts.  Assisted pt processing for the management of her stressors.  Participation Level: Active  Diagnosis: Axis I.:  GAD    Follow Up Instructions: I discussed the assessment and treatment plan with the patient. The patient was provided an opportunity to ask questions and all were answered. The patient agreed with the plan and demonstrated an understanding of the instructions.   The patient was advised to call back or seek an in-person evaluation if the symptoms worsen or if the condition fails to improve as anticipated.  I provided 45 minutes of non-face-to-face time during this encounter.   Cindy Jones S, LCAS

## 2021-03-07 ENCOUNTER — Ambulatory Visit (INDEPENDENT_AMBULATORY_CARE_PROVIDER_SITE_OTHER): Payer: No Typology Code available for payment source | Admitting: Licensed Clinical Social Worker

## 2021-03-07 ENCOUNTER — Other Ambulatory Visit: Payer: Self-pay

## 2021-03-07 ENCOUNTER — Encounter (HOSPITAL_COMMUNITY): Payer: Self-pay | Admitting: Licensed Clinical Social Worker

## 2021-03-07 DIAGNOSIS — F411 Generalized anxiety disorder: Secondary | ICD-10-CM | POA: Diagnosis not present

## 2021-03-07 NOTE — Progress Notes (Signed)
Virtual Visit via Video Note  I connected with Cindy Jones on 03/07/21 at 1:00pm EST by a video enabled telemedicine application and verified that I am speaking with the correct person using two identifiers.   I discussed the limitations of evaluation and management by telemedicine and the availability of in person appointments. The patient expressed understanding and agreed to proceed.  LOCATION: Patient:Home Provider: Home Office  History of Present Illness: Pt was referred to therapy by her psychiatrist at [facility] for anxiety and panic attacks.   Participation Level: Active   Type of Therapy: Virtual Video individual therapy  Treatment Goals addressed: Improve psychiatric symptoms, Controlled Behavior, Moderated Mood, Improve Unhelpful Thought Patterns, Emotional Regulation Skills (Moderate moods, anger management, stress management), Feel and express a full Range of Emotions, Learn about Diagnosis, Healthy Coping Skills.  Interventions: CBT/Supportive/Psychoeducation  Summary: Patient presented for today's session on time and was alert, oriented x5, with no evidence or self-report of SI/HI or A/V H.    Clinician inquired about patient's current emotional ratings, as well as any significant changes in thoughts, feelings or behavior since previous session. Patient's ratings  1/10 for depression, 5/10 for anxiety, 5/10 for anger/irritability, and denied any reoccurrence of panic attacks. Cln and pt explored emotional ratings and coping skills.  Pt reports on challenges she and her husband are having communicating in their relationship. Pt's husband joined the session where he asserted the communication errors they are experiencing while communicating. Clinician utilized CBT to process their thoughts, feelings, and behaviors.   Plan: abandonment, I've never liked my mom  Plan: retraining your brain    Assessment and plan: Counselor will continue to meet with patient to address treatment  plan goals. Patient will continue to follow recommendations of providers and implement skills learned in session.  Suicidal/Homicidal: Nowithout intent/plan  Therapist Response: Assessed pt's current functioning and reviewed progress.. Assisted pt processing stressors, negative thoughts.  Assisted pt processing for the management of her stressors.  Participation Level: Active  Diagnosis: Axis I.:  GAD    Follow Up Instructions: I discussed the assessment and treatment plan with the patient. The patient was provided an opportunity to ask questions and all were answered. The patient agreed with the plan and demonstrated an understanding of the instructions.   The patient was advised to call back or seek an in-person evaluation if the symptoms worsen or if the condition fails to improve as anticipated.  I provided 60 minutes of non-face-to-face time during this encounter.   Tenya Araque S, LCAS

## 2021-03-14 ENCOUNTER — Ambulatory Visit (INDEPENDENT_AMBULATORY_CARE_PROVIDER_SITE_OTHER): Payer: No Typology Code available for payment source | Admitting: Licensed Clinical Social Worker

## 2021-03-14 ENCOUNTER — Other Ambulatory Visit: Payer: Self-pay

## 2021-03-14 ENCOUNTER — Encounter (HOSPITAL_COMMUNITY): Payer: Self-pay | Admitting: Licensed Clinical Social Worker

## 2021-03-14 DIAGNOSIS — F411 Generalized anxiety disorder: Secondary | ICD-10-CM | POA: Diagnosis not present

## 2021-03-14 NOTE — Progress Notes (Signed)
Virtual Visit via Video Note  I connected with Cindy Jones on 03/14/21 at 1:00pm EST by a video enabled telemedicine application and verified that I am speaking with the correct person using two identifiers.   I discussed the limitations of evaluation and management by telemedicine and the availability of in person appointments. The patient expressed understanding and agreed to proceed.  LOCATION: Patient:Home Provider: Home Office  History of Present Illness: Pt was referred to therapy by her psychiatrist at [facility] for anxiety and panic attacks.   Participation Level: Active   Type of Therapy: Virtual Video individual therapy  Treatment Goals addressed: Improve psychiatric symptoms, Controlled Behavior, Moderated Mood, Improve Unhelpful Thought Patterns, Emotional Regulation Skills (Moderate moods, anger management, stress management), Feel and express a full Range of Emotions, Learn about Diagnosis, Healthy Coping Skills.  Interventions: CBT/Supportive/Psychoeducation  Summary: Patient presented for today's session on time and was alert, oriented x5, with no evidence or self-report of SI/HI or A/V H.    Clinician inquired about patient's current emotional ratings, as well as any significant changes in thoughts, feelings or behavior since previous session. Patient's ratings  1/10 for depression, 5/10 for anxiety, 5/10 for anger/irritability, and denied any reoccurrence of panic attacks. Cln and pt explored emotional ratings and coping skills. Pt reports "I'm overwhelmed with work and school." Clinician utilized MI OARS to affirm concerns. Pt's husband joined the session again where he asserted the different forms of communication issues they continue to experience. Again, the patient and husband talked to each other about their form of communication currently, upcoming holidays.     Plan: abandonment, I've never liked my mom  Plan: retraining your brain    Assessment and plan:  Counselor will continue to meet with patient to address treatment plan goals. Patient will continue to follow recommendations of providers and implement skills learned in session.  Suicidal/Homicidal: Nowithout intent/plan  Therapist Response: Assessed pt's current functioning and reviewed progress.. Assisted pt processing stressors, negative thoughts.  Assisted pt processing for the management of her stressors.  Participation Level: Active  Diagnosis: Axis I.:  GAD    Follow Up Instructions: I discussed the assessment and treatment plan with the patient. The patient was provided an opportunity to ask questions and all were answered. The patient agreed with the plan and demonstrated an understanding of the instructions.   The patient was advised to call back or seek an in-person evaluation if the symptoms worsen or if the condition fails to improve as anticipated.  I provided 60 minutes of non-face-to-face time during this encounter.   Cindy Jones S, LCAS

## 2021-03-21 ENCOUNTER — Other Ambulatory Visit (HOSPITAL_COMMUNITY): Payer: Self-pay

## 2021-03-21 ENCOUNTER — Ambulatory Visit (INDEPENDENT_AMBULATORY_CARE_PROVIDER_SITE_OTHER): Payer: No Typology Code available for payment source | Admitting: Licensed Clinical Social Worker

## 2021-03-21 ENCOUNTER — Other Ambulatory Visit: Payer: Self-pay

## 2021-03-21 ENCOUNTER — Encounter (HOSPITAL_COMMUNITY): Payer: Self-pay | Admitting: Licensed Clinical Social Worker

## 2021-03-21 DIAGNOSIS — F411 Generalized anxiety disorder: Secondary | ICD-10-CM | POA: Diagnosis not present

## 2021-03-21 NOTE — Progress Notes (Signed)
Virtual Visit via Video Note  I connected with Cindy Jones on 03/21/21 at 1:00pm EST by a video enabled telemedicine application and verified that I am speaking with the correct person using two identifiers.   I discussed the limitations of evaluation and management by telemedicine and the availability of in person appointments. The patient expressed understanding and agreed to proceed.  LOCATION: Patient: Home Provider: Home Office  History of Present Illness: Pt was referred to therapy by her psychiatrist at [facility] for anxiety and panic attacks.   Participation Level: Active   Type of Therapy: Virtual Video individual therapy  Treatment Goals addressed: Improve psychiatric symptoms, Controlled Behavior, Moderated Mood, Improve Unhelpful Thought Patterns, Emotional Regulation Skills (Moderate moods, anger management, stress management), Feel and express a full Range of Emotions, Learn about Diagnosis, Healthy Coping Skills.  Interventions: CBT/Supportive/Psychoeducation  Summary: Patient presented for today's session on time and was alert, oriented x5, with no evidence or self-report of SI/HI or A/V H.    Clinician inquired about patient's current emotional ratings, as well as any significant changes in thoughts, feelings or behavior since previous session. Patient's ratings  1/10 for depression, 5/10 for anxiety, 5/10 for anger/irritability, and denied any reoccurrence of panic attacks. Cln and pt explored emotional ratings and coping skills. Pt reports "I'm still overwhelmed with work and school. My husband and I are not communicating well, especially when I'm stressed." Cln provided education on "making an appointment" for conversation with husband. Cln provided education on self-soothing.    Plan: abandonment, I've never liked my mom  Plan: retraining your brain    Assessment and plan: Counselor will continue to meet with patient to address treatment plan goals. Patient will  continue to follow recommendations of providers and implement skills learned in session.  Suicidal/Homicidal: Nowithout intent/plan  Therapist Response: Assessed pt's current functioning and reviewed progress.. Assisted pt processing stressors, negative thoughts.  Assisted pt processing for the management of her stressors.  Participation Level: Active  Diagnosis: Axis I.:  GAD    Follow Up Instructions: I discussed the assessment and treatment plan with the patient. The patient was provided an opportunity to ask questions and all were answered. The patient agreed with the plan and demonstrated an understanding of the instructions.   The patient was advised to call back or seek an in-person evaluation if the symptoms worsen or if the condition fails to improve as anticipated.  I provided 60 minutes of non-face-to-face time during this encounter.   Divya Munshi S, LCAS

## 2021-03-28 ENCOUNTER — Ambulatory Visit (INDEPENDENT_AMBULATORY_CARE_PROVIDER_SITE_OTHER): Payer: No Typology Code available for payment source | Admitting: Licensed Clinical Social Worker

## 2021-03-28 ENCOUNTER — Other Ambulatory Visit: Payer: Self-pay

## 2021-03-28 ENCOUNTER — Encounter (HOSPITAL_COMMUNITY): Payer: Self-pay | Admitting: Licensed Clinical Social Worker

## 2021-03-28 DIAGNOSIS — F411 Generalized anxiety disorder: Secondary | ICD-10-CM

## 2021-03-28 NOTE — Progress Notes (Signed)
Virtual Visit via Video Note  I connected with Cindy Jones on 03/28/21 at 1:00pm EST by a video enabled telemedicine application and verified that I am speaking with the correct person using two identifiers.   I discussed the limitations of evaluation and management by telemedicine and the availability of in person appointments. The patient expressed understanding and agreed to proceed.  LOCATION: Patient: Home Provider: Home Office  History of Present Illness: Pt was referred to therapy by her psychiatrist at [facility] for anxiety and panic attacks.   Participation Level: Active   Type of Therapy: Virtual Video individual therapy  Treatment Goals addressed: Improve psychiatric symptoms, Controlled Behavior, Moderated Mood, Improve Unhelpful Thought Patterns, Emotional Regulation Skills (Moderate moods, anger management, stress management), Feel and express a full Range of Emotions, Learn about Diagnosis, Healthy Coping Skills.  Interventions: CBT/Supportive/Psychoeducation  Summary: Patient presented for today's session on time and was alert, oriented x5, with no evidence or self-report of SI/HI or A/V H.    Clinician inquired about patient's current emotional ratings, as well as any significant changes in thoughts, feelings or behavior since previous session. Patient's ratings  1/10 for depression, 5/10 for anxiety, 5/10 for anger/irritability, and denied any reoccurrence of panic attacks. Cln and pt explored emotional ratings and coping skills. Pt reports on her thanksgiving plans which included her mother. "I used my coping skill, not having standards or expectations of my mother." Pt's husband joined the session briefly and reports they have been communicating better. Cln and pt explored her tourette's diagnosis: status, triggers, initial diagnosis. Cln and pt explored ways to minimize the symptoms.      Plan: abandonment, I've never liked my mom  Plan: retraining your brain     Assessment and plan: Counselor will continue to meet with patient to address treatment plan goals. Patient will continue to follow recommendations of providers and implement skills learned in session.  Suicidal/Homicidal: Nowithout intent/plan  Therapist Response: Assessed pt's current functioning and reviewed progress.. Assisted pt processing stressors, negative thoughts.  Assisted pt processing for the management of her stressors.  Participation Level: Active  Diagnosis: Axis I.:  GAD    Follow Up Instructions: I discussed the assessment and treatment plan with the patient. The patient was provided an opportunity to ask questions and all were answered. The patient agreed with the plan and demonstrated an understanding of the instructions.   The patient was advised to call back or seek an in-person evaluation if the symptoms worsen or if the condition fails to improve as anticipated.  I provided 60 minutes of non-face-to-face time during this encounter.   Taylour Lietzke S, LCAS

## 2021-04-04 ENCOUNTER — Ambulatory Visit (HOSPITAL_COMMUNITY): Payer: No Typology Code available for payment source | Admitting: Licensed Clinical Social Worker

## 2021-04-04 ENCOUNTER — Other Ambulatory Visit: Payer: Self-pay

## 2021-04-07 ENCOUNTER — Other Ambulatory Visit (HOSPITAL_COMMUNITY): Payer: Self-pay

## 2021-04-07 ENCOUNTER — Telehealth (HOSPITAL_BASED_OUTPATIENT_CLINIC_OR_DEPARTMENT_OTHER): Payer: No Typology Code available for payment source | Admitting: Psychiatry

## 2021-04-07 ENCOUNTER — Other Ambulatory Visit: Payer: Self-pay

## 2021-04-07 DIAGNOSIS — F411 Generalized anxiety disorder: Secondary | ICD-10-CM

## 2021-04-07 DIAGNOSIS — F410 Panic disorder [episodic paroxysmal anxiety] without agoraphobia: Secondary | ICD-10-CM | POA: Diagnosis not present

## 2021-04-07 DIAGNOSIS — F988 Other specified behavioral and emotional disorders with onset usually occurring in childhood and adolescence: Secondary | ICD-10-CM | POA: Diagnosis not present

## 2021-04-07 DIAGNOSIS — F952 Tourette's disorder: Secondary | ICD-10-CM

## 2021-04-07 MED ORDER — AMPHETAMINE-DEXTROAMPHET ER 20 MG PO CP24
20.0000 mg | ORAL_CAPSULE | Freq: Every day | ORAL | 0 refills | Status: DC
  Filled 2021-04-07 – 2021-05-20 (×2): qty 30, 30d supply, fill #0

## 2021-04-07 MED ORDER — AMPHETAMINE-DEXTROAMPHET ER 25 MG PO CP24
25.0000 mg | ORAL_CAPSULE | ORAL | 0 refills | Status: DC
  Filled 2021-04-07 – 2021-04-22 (×2): qty 30, 30d supply, fill #0

## 2021-04-07 NOTE — Progress Notes (Signed)
Virtual Visit via Video Note  I connected with Cindy Jones on 04/07/21 at 11:30 AM EST by a video enabled telemedicine application and verified that I am speaking with the correct person using two identifiers.  Location: Patient: home Provider: office   I discussed the limitations of evaluation and management by telemedicine and the availability of in person appointments. The patient expressed understanding and agreed to proceed.  History of Present Illness: Cindy Jones is doing well. She is graduating tomorrow. She recently started a new job and really likes it so far. She is looking forward to having to her time back and wonders what she will do. Cindy Jones does not want to do any more school. Cindy Jones wants to get comfortable with her job before trying to get pregnant. She wants to start tapering off her meds. Cindy Jones denies depression. Her stress level has recently decreased. She denies isolation, hopelessness and anhedonia. She denies SI/HI. Her anxiety is mildly increased due to holiday season. She finds it overwhelming to be in large crowds. Cindy Jones is stressed out this morning due to having a party later today. She felt better after talking to her husband. She denies any recent panic attacks. Cindy Jones states her eye blinking and chest tightness are directly related to her fatigue and anxiety levels. At the of the day it is more frequent. Her appetite is good and she is working with a personal nutrionalist due to allergy and hives after eating certain foods. She denies purging or restricting food. Her ADD has "been ok". It is harder to focus on tasks she doesn't like or is in interested in. She states meds work well and she is using other coping skills when needed. She would like to wean off the Adderall 10mg  because she is no longer in school.    Observations/Objective: Psychiatric Specialty Exam: ROS  There were no vitals taken for this visit.There is no height or weight on file to calculate BMI.  General  Appearance: Fairly Groomed and Neat  Eye Contact:  Good  Speech:  Clear and Coherent and Normal Rate  Volume:  Normal  Mood:  Euthymic  Affect:  Full Range  Thought Process:  Goal Directed, Linear, and Descriptions of Associations: Intact  Orientation:  Full (Time, Place, and Person)  Thought Content:  Logical  Suicidal Thoughts:  No  Homicidal Thoughts:  No  Memory:  Immediate;   Good  Judgement:  Good  Insight:  Good  Psychomotor Activity:  Normal  Concentration:  Concentration: Good  Recall:  Good  Fund of Knowledge:  Good  Language:  Good  Akathisia:  No  Handed:  Right  AIMS (if indicated):     Assets:  Communication Skills Desire for Improvement Financial Resources/Insurance Housing Intimacy Leisure Time Physical Health Resilience Social Support Talents/Skills Transportation Vocational/Educational  ADL's:  Intact  Cognition:  WNL  Sleep:        Assessment and Plan: Depression screen [facility] 2/9 04/07/2021 10/14/2020 08/19/2020 07/22/2020 06/24/2020  Decreased Interest 0 0 0 0 0  Down, Depressed, Hopeless 0 0 0 0 0  PHQ - 2 Score 0 0 0 0 0  Some recent data might be hidden    Flowsheet Row Video Visit [facility]/07/2021 in [facility] Video Visit from 10[facility]14/2020 in [facility] Video Visit from 08/19/2020 in [facility]  C-SSRS RISK CATEGORY No Risk No Risk No Risk      D/c Adderall 10mg    Plan to  decrease Adderall XR 20mg  po qD next month. Goal is to wean off and stop Adderall before she starts trying to plan to get pregnant.   1. ADD (attention deficit disorder) without hyperactivity - amphetamine-dextroamphetamine (ADDERALL XR) 25 MG 24 hr capsule; Take 1 capsule by mouth every morning.  Dispense: 30 capsule; Refill: 0 - amphetamine-dextroamphetamine (ADDERALL XR) 20 MG 24 hr capsule; Take 1 capsule (20 mg total) by mouth daily.  Dispense: 30  capsule; Refill: 0  2. GAD (generalized anxiety disorder)  3. Panic attacks  4. Tourette disease   Follow Up Instructions: In 1-2 months or sooner if needed   I discussed the assessment and treatment plan with the patient. The patient was provided an opportunity to ask questions and all were answered. The patient agreed with the plan and demonstrated an understanding of the instructions.   The patient was advised to call back or seek an in-person evaluation if the symptoms worsen or if the condition fails to improve as anticipated.  I provided 24 minutes of non-face-to-face time during this encounter.   , MD

## 2021-04-11 ENCOUNTER — Ambulatory Visit (INDEPENDENT_AMBULATORY_CARE_PROVIDER_SITE_OTHER): Payer: No Typology Code available for payment source | Admitting: Licensed Clinical Social Worker

## 2021-04-11 ENCOUNTER — Other Ambulatory Visit: Payer: Self-pay

## 2021-04-11 ENCOUNTER — Encounter (HOSPITAL_COMMUNITY): Payer: Self-pay | Admitting: Licensed Clinical Social Worker

## 2021-04-11 DIAGNOSIS — F411 Generalized anxiety disorder: Secondary | ICD-10-CM

## 2021-04-11 NOTE — Progress Notes (Signed)
Virtual Visit via Video Note  I connected with Cindy Jones on 04/11/21 at 1:00pm EST by a video enabled telemedicine application and verified that I am speaking with the correct person using two identifiers.   I discussed the limitations of evaluation and management by telemedicine and the availability of in person appointments. The patient expressed understanding and agreed to proceed.  LOCATION: Patient: Home Provider: Home Office  History of Present Illness: Pt was referred to therapy by her psychiatrist at [facility] for anxiety and panic attacks.   Participation Level: Active   Type of Therapy: Virtual Video individual therapy  Treatment Goals addressed: Improve psychiatric symptoms, Controlled Behavior, Moderated Mood, Improve Unhelpful Thought Patterns, Emotional Regulation Skills (Moderate moods, anger management, stress management), Feel and express a full Range of Emotions, Learn about Diagnosis, Healthy Coping Skills.  Interventions: CBT/Supportive/Psychoeducation  Summary: Patient presented for today's session on time and was alert, oriented x5, with no evidence or self-report of SI/HI or A/V H.    Clinician inquired about patient's current emotional ratings, as well as any significant changes in thoughts, feelings or behavior since previous session. Patient's ratings  1/10 for depression, 5/10 for anxiety, 5/10 for anger/irritability, and denied any reoccurrence of panic attacks. Cln and pt explored emotional ratings and coping skills. Pt reports, "I finally graduated from grad school." Cln congratulated pt on her accomplishment, found a new job in her field, supporting her husband in his new endeavor of NP school, trying to start a family in the next few months. This will be new stressors for patient. Patient participated in a discussion on using mindfulness-based stress response to assist patient in lowering the stress response. Patient was encouraged to use mindfulness skills as a  coping skill.      Plan: abandonment, I've never liked my mom  Plan: retraining your brain    Assessment and plan: Counselor will continue to meet with patient to address treatment plan goals. Patient will continue to follow recommendations of providers and implement skills learned in session.  Suicidal/Homicidal: Nowithout intent/plan  Therapist Response: Assessed pt's current functioning and reviewed progress.. Assisted pt processing stressors, negative thoughts.  Assisted pt processing for the management of her stressors.  Participation Level: Active  Diagnosis: Axis I.:  GAD    Follow Up Instructions: I discussed the assessment and treatment plan with the patient. The patient was provided an opportunity to ask questions and all were answered. The patient agreed with the plan and demonstrated an understanding of the instructions.   The patient was advised to call back or seek an in-person evaluation if the symptoms worsen or if the condition fails to improve as anticipated.  I provided 60 minutes of non-face-to-face time during this encounter.   Cindy Jones S, LCAS

## 2021-04-18 ENCOUNTER — Encounter (HOSPITAL_COMMUNITY): Payer: Self-pay | Admitting: Licensed Clinical Social Worker

## 2021-04-18 ENCOUNTER — Ambulatory Visit (INDEPENDENT_AMBULATORY_CARE_PROVIDER_SITE_OTHER): Payer: No Typology Code available for payment source | Admitting: Licensed Clinical Social Worker

## 2021-04-18 DIAGNOSIS — F988 Other specified behavioral and emotional disorders with onset usually occurring in childhood and adolescence: Secondary | ICD-10-CM | POA: Diagnosis not present

## 2021-04-18 DIAGNOSIS — F411 Generalized anxiety disorder: Secondary | ICD-10-CM

## 2021-04-18 NOTE — Progress Notes (Addendum)
Virtual Visit via Video Note  I connected with Cindy Jones on 04/18/21 at 1:00pm EST by a video enabled telemedicine application and verified that I am speaking with the correct person using two identifiers.   I discussed the limitations of evaluation and management by telemedicine and the availability of in person appointments. The patient expressed understanding and agreed to proceed.  LOCATION: Patient: Home Provider: Home Office  History of Present Illness: Pt was referred to therapy by her psychiatrist at [facility] for anxiety and panic attacks.   Participation Level: Active   Type of Therapy: Virtual Video individual therapy  Treatment Goals addressed: Improve psychiatric symptoms, Controlled Behavior, Moderated Mood, Improve Unhelpful Thought Patterns, Emotional Regulation Skills (Moderate moods, anger management, stress management), Feel and express a full Range of Emotions, Learn about Diagnosis, Healthy Coping Skills.  Interventions: CBT/Supportive/Psychoeducation  Summary: Patient presented for todays session on time and was alert, oriented x5, with no evidence or self-report of SI/HI or A/V H.    Clinician inquired about patients current emotional ratings, as well as any significant changes in thoughts, feelings or behavior since previous session. Patient's ratings  1/10 for depression, 5/10 for anxiety, 5/10 for anger/irritability, and denied any reoccurrence of panic attacks. Cln and pt explored emotional ratings and coping skills. Pt tearfully reported she was contacted via text by her mother requesting pt to join in family therapy. Pt identified her emotions: anger, frustration, sadness. Clinician utilized MI OARS to reflect and summarize her thoughts and feelings. Pt's emotions were up and down through the session, "I didn't take my Adderall today, just to see how I would do today, since I'm going to stop taking it soon due to trying to get pregnant.." Cln and pt reviewed the  effects of withdrawal of Adderall. She was meeting all the withdrawal effects today. Cln suggested pt reach out to Dr. Michae Kava if she decides to change her doseage reduction of Adderall. Cln reviewed tx plan with pt who verbalized acceptance of the plan.      Plan: abandonment  Plan: retraining your brain    Assessment and plan: Counselor will continue to meet with patient to address treatment plan goals. Patient will continue to follow recommendations of providers and implement skills learned in session.  Suicidal/Homicidal: Nowithout intent/plan  Therapist Response: Assessed pt's current functioning and reviewed progress.. Assisted pt processing stressors, negative thoughts.  Assisted pt processing for the management of her stressors.  Participation Level: Active  Diagnosis: Axis I.:  GAD    Follow Up Instructions: I discussed the assessment and treatment plan with the patient. The patient was provided an opportunity to ask questions and all were answered. The patient agreed with the plan and demonstrated an understanding of the instructions.   The patient was advised to call back or seek an in-person evaluation if the symptoms worsen or if the condition fails to improve as anticipated.  I provided 60 minutes of non-face-to-face time during this encounter.   Sundus Pete S, LCAS

## 2021-04-22 ENCOUNTER — Other Ambulatory Visit (HOSPITAL_COMMUNITY): Payer: Self-pay

## 2021-04-27 ENCOUNTER — Other Ambulatory Visit: Payer: Self-pay

## 2021-04-27 ENCOUNTER — Ambulatory Visit (HOSPITAL_COMMUNITY): Payer: No Typology Code available for payment source | Admitting: Licensed Clinical Social Worker

## 2021-05-03 ENCOUNTER — Encounter (HOSPITAL_COMMUNITY): Payer: Self-pay | Admitting: Licensed Clinical Social Worker

## 2021-05-03 ENCOUNTER — Ambulatory Visit (INDEPENDENT_AMBULATORY_CARE_PROVIDER_SITE_OTHER): Payer: No Typology Code available for payment source | Admitting: Licensed Clinical Social Worker

## 2021-05-03 ENCOUNTER — Other Ambulatory Visit: Payer: Self-pay

## 2021-05-03 DIAGNOSIS — F411 Generalized anxiety disorder: Secondary | ICD-10-CM

## 2021-05-03 NOTE — Progress Notes (Signed)
Virtual Visit via Video Note  I connected with Cindy Jones on 05/03/21 at 2:00pm EST by a video enabled telemedicine application and verified that I am speaking with the correct person using two identifiers.   I discussed the limitations of evaluation and management by telemedicine and the availability of in person appointments. The patient expressed understanding and agreed to proceed.  LOCATION: Patient: Home Provider: Home Office  History of Present Illness: Pt was referred to therapy by her psychiatrist at [facility] for anxiety and panic attacks.   Participation Level: Active   Type of Therapy: Virtual Video individual therapy  Treatment Goals addressed: Improve psychiatric symptoms, Controlled Behavior, Moderated Mood, Improve Unhelpful Thought Patterns, Emotional Regulation Skills (Moderate moods, anger management, stress management), Feel and express a full Range of Emotions, Learn about Diagnosis, Healthy Coping Skills.  Interventions: CBT/Supportive/Psychoeducation  Summary: Patient presented for todays session on time and was alert, oriented x5, with no evidence or self-report of SI/HI or A/V H.    Clinician inquired about patients current emotional ratings, as well as any significant changes in thoughts, feelings or behavior since previous session. Patient's ratings  1/10 for depression, 5/10 for anxiety, 5/10 for anger/irritability, and denied any reoccurrence of panic attacks. Cln and pt explored emotional ratings and coping skills. Pt provided an update on family, work, stressors, goals. Pt reports on communication and decision-making between she and her husband on "trying to have a baby." Pt shared her concerns in communicating with her husband about this topic. Cln provided education on effective communication skills which may need to be adjusted for cultural differences. Pt reports her stress level is significantly reduced due to not being in school. "I need to make new friends."  Cln assisted pt problem solving, goal setting.        Plan: abandonment  Plan: retraining your brain    Assessment and plan: Counselor will continue to meet with patient to address treatment plan goals. Patient will continue to follow recommendations of providers and implement skills learned in session.  Suicidal/Homicidal: Nowithout intent/plan  Therapist Response: Assessed pt's current functioning and reviewed progress.. Assisted pt processing stressors, negative thoughts.  Assisted pt processing for the management of her stressors.  Participation Level: Active  Diagnosis: Axis I.:  GAD    Follow Up Instructions: I discussed the assessment and treatment plan with the patient. The patient was provided an opportunity to ask questions and all were answered. The patient agreed with the plan and demonstrated an understanding of the instructions.   The patient was advised to call back or seek an in-person evaluation if the symptoms worsen or if the condition fails to improve as anticipated.  I provided 45 minutes of non-face-to-face time during this encounter.   Cindy Jones S, LCAS

## 2021-05-09 ENCOUNTER — Encounter (HOSPITAL_COMMUNITY): Payer: Self-pay | Admitting: Licensed Clinical Social Worker

## 2021-05-09 ENCOUNTER — Ambulatory Visit (INDEPENDENT_AMBULATORY_CARE_PROVIDER_SITE_OTHER): Payer: No Typology Code available for payment source | Admitting: Licensed Clinical Social Worker

## 2021-05-09 ENCOUNTER — Other Ambulatory Visit: Payer: Self-pay

## 2021-05-09 DIAGNOSIS — F411 Generalized anxiety disorder: Secondary | ICD-10-CM
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
## 2021-05-16 ENCOUNTER — Other Ambulatory Visit: Payer: Self-pay

## 2021-05-16 ENCOUNTER — Encounter (HOSPITAL_COMMUNITY): Payer: Self-pay | Admitting: Licensed Clinical Social Worker

## 2021-05-16 ENCOUNTER — Ambulatory Visit (INDEPENDENT_AMBULATORY_CARE_PROVIDER_SITE_OTHER): Payer: No Typology Code available for payment source | Admitting: Licensed Clinical Social Worker

## 2021-05-16 DIAGNOSIS — F411 Generalized anxiety disorder: Secondary | ICD-10-CM
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
## 2021-05-20 ENCOUNTER — Other Ambulatory Visit (HOSPITAL_COMMUNITY): Payer: Self-pay

## 2021-05-21 ENCOUNTER — Other Ambulatory Visit: Payer: Self-pay

## 2021-05-21 ENCOUNTER — Emergency Department
Admission: RE | Admit: 2021-05-21 | Discharge: 2021-05-21 | Disposition: A | Discharge status: Home / Self Care | Payer: No Typology Code available for payment source | Source: Ambulatory Visit | Attending: Family Medicine | Admitting: Family Medicine

## 2021-05-21 ENCOUNTER — Emergency Department (INDEPENDENT_AMBULATORY_CARE_PROVIDER_SITE_OTHER): Payer: No Typology Code available for payment source

## 2021-05-21 VITALS — BP 116/78 | HR 84 | Temp 98.6°F | Ht 66.0 in | Wt 165.0 lb

## 2021-05-21 DIAGNOSIS — M25571 Pain in right ankle and joints of right foot: Secondary | ICD-10-CM

## 2021-05-21 DIAGNOSIS — S93601A Unspecified sprain of right foot, initial encounter: Secondary | ICD-10-CM

## 2021-05-21 DIAGNOSIS — M79671 Pain in right foot: Secondary | ICD-10-CM | POA: Diagnosis not present

## 2021-05-21 NOTE — Discharge Instructions (Signed)
Apply ice pack for 30 minutes 2 to 3 times daily until swelling decreases.  Elevate when possible.  Use crutches for 3 to 5 days.  Wear Ace wrap until swelling decreases.  Begin range of motion and stretching exercises in about 5 days as per instruction sheet.  May take Ibuprofen 200mg , 4 tabs every 8 hours with food.

## 2021-05-21 NOTE — ED Triage Notes (Signed)
Patient states that she rolled her right foot/ankle last week walking her dog.  Patient went rock climbing the next day trying to walk it out.  Pain is no better.  Has applied ice, ace wrap, elevate and taken Advil w/o much relief.

## 2021-05-21 NOTE — ED Provider Notes (Signed)
[facility]    CSN: [id number] Arrival date & time: 05/21/21  0943      History   Chief Complaint Chief Complaint  Patient presents with   Appointment   Ankle Pain    HPI Cindy Jones is a 31 y.o. female.   Patient states that she inverted her right foot/ankle last week while walking her dog.  She re-injured her foot the next day while rock climbing.  She has had persistent pain despite using an Ace wrap, ice, and ibuprofen.  The history is provided by the patient.  Ankle Pain Location:  Ankle and foot Time since incident:  1 week Injury: yes   Mechanism of injury comment:  Inverted Ankle location:  R ankle Foot location:  Dorsum of R foot Pain details:    Quality:  Aching   Radiates to:  Does not radiate   Severity:  Moderate   Onset quality:  Sudden   Duration:  1 week   Timing:  Constant   Progression:  Unchanged Chronicity:  New Prior injury to area:  No Relieved by:  Nothing Worsened by:  Bearing weight and activity Ineffective treatments:  NSAIDs, compression and ice Associated symptoms: decreased ROM and stiffness   Associated symptoms: no muscle weakness, no numbness and no swelling    Past Medical History:  Diagnosis Date   Anxiety    Asthma    as a child   Binge eating disorder    Dysrhythmia    " I feel it about once a month"   Frequent headaches    GAD (generalized anxiety disorder)    GERD (gastroesophageal reflux disease)    History of acute PID 06/09/2019   Major depression in complete remission (HCC)    Migraines    PID (acute pelvic inflammatory disease)    Seasonal allergies    Tourette disorder    Urinary tract bacterial infections     Patient Active Problem List   Diagnosis Date Noted   History of asthma 09/23/2020   Urticaria 08/24/2020   Other adverse food reactions, not elsewhere classified, subsequent encounter 08/24/2020   Other allergic rhinitis 08/24/2020   B12 deficiency 07/01/2020   Exercise-induced  asthma 10/27/2019   Overweight (BMI 25.0-29.9) 06/03/2018   Bulimia 04/20/2015   ADD (attention deficit disorder) 01/15/2014   Tourette disease 10/18/2013    Past Surgical History:  Procedure Laterality Date   CHOLECYSTECTOMY     ESOPHAGOGASTRODUODENOSCOPY     LAPAROSCOPIC CHOLECYSTECTOMY SINGLE SITE WITH INTRAOPERATIVE CHOLANGIOGRAM N/A 03/15/2016   Procedure: LAPAROSCOPIC CHOLECYSTECTOMY SINGLE SITE WITH INTRAOPERATIVE CHOLANGIOGRAM;  Surgeon: Karie Soda, MD;  Location: [facility] OR;  Service: General;  Laterality: N/A;   TONSILLECTOMY AND ADENOIDECTOMY  2013   WISDOM TOOTH EXTRACTION      OB History     Gravida  1   Para  0   Term  0   Preterm  0   AB  1   Living  0      SAB  1   IAB  0   Ectopic  0   Multiple  0   Live Births               Home Medications    Prior to Admission medications   Medication Sig Start Date End Date Taking? Authorizing Provider  albuterol (VENTOLIN HFA) 108 (90 Base) MCG/ACT inhaler Inhale 1-2 puffs into the lungs 15 minutes prior to exercise as directed. 10/22/20  Yes Kuneff, Renee A, DO  amphetamine-dextroamphetamine (ADDERALL XR) 20 MG 24 hr capsule Take 1 capsule (20 mg total) by mouth daily 04/07/21  Yes Oletta DarterAgarwal, [place], MD  amphetamine-dextroamphetamine (ADDERALL XR) 25 MG 24 hr capsule Take 1 capsule by mouth every morning. 04/07/21  Yes Oletta DarterAgarwal, [place], MD  cetirizine (ZYRTEC ALLERGY) 10 MG tablet Take 1 tablet (10 mg total) by mouth 2 (two) times daily. 08/24/20  Yes Ellamae SiaKim, Yoon M, DO  clindamycin (CLEOCIN T) 1 % lotion Apply a small amount to skin once to twice daily as needed for flare 12/02/20  Yes   Cyanocobalamin (VITAMIN B-12) 1000 MCG SUBL Place under the tongue.   Yes [provider]  ondansetron (ZOFRAN ODT) 4 MG disintegrating tablet Dissolve 1 tablet (4 mg total) by mouth every 8 (eight) hours as needed for nausea or vomiting. 08/09/20  Yes Everlena CooperJaffe, Adam R, DO  SUMAtriptan (IMITREX) 20 MG/ACT nasal spray Use 1  spray in one nostril as directed.  May repeat once in 2 hours if headache persists or recurs. 01/31/21  Yes Jaffe, Adam R, DO  Vitamin D, Ergocalciferol, (DRISDOL) 1.25 MG (50000 UNIT) CAPS capsule Take 1 capsule by mouth every 7 days. 09/20/20  Yes Kuneff, Renee A, DO    Family History Family History  Problem Relation Age of Onset   Alcohol abuse Mother    Bipolar disorder Mother    Cancer Father        sarcoma; passed away when pt was 8   Tourette syndrome Maternal Aunt    Alcohol abuse Maternal Aunt    Hyperlipidemia Paternal Aunt    Hashimoto's thyroiditis Paternal Aunt    Tourette syndrome Maternal Grandfather    Heart disease Paternal Grandfather    Asthma Sister    Allergic rhinitis Sister    Eczema Sister    Alcohol abuse Maternal Uncle    Emphysema Maternal Grandmother    Emphysema Paternal Grandmother    Suicidality Neg Hx    Urticaria Neg Hx    Angioedema Neg Hx     Social History Social History   Tobacco Use   Smoking status: Never   Smokeless tobacco: Never  Vaping Use   Vaping Use: Never used  Substance Use Topics   Alcohol use: Yes    Alcohol/week: 1.0 standard drink    Types: 1 Glasses of wine per week    Comment: occ   Drug use: No     Allergies   Latex, Barley grass, Cranberry juice powder, Naproxen, Nickel, and Sulfa antibiotics   Review of Systems Review of Systems  Musculoskeletal:  Positive for stiffness.       Right foot and ankle pain.  Skin:  Negative for color change.  All other systems reviewed and are negative.   Physical Exam Triage Vital Signs ED Triage Vitals  Enc Vitals Group     BP 05/21/21 1009 116/78     Pulse Rate 05/21/21 1009 84     Resp --      Temp 05/21/21 1009 98.6 F (37 C)     Temp Source 05/21/21 1009 Oral     SpO2 05/21/21 1009 99 %     Weight 05/21/21 1010 165 lb (74.8 kg)     Height 05/21/21 1010 5\' 6"  (1.676 m)     Head Circumference --      Peak Flow --      Pain Score 05/21/21 1010 6     Pain Loc  --      Pain Edu? --  Excl. in GC? --    No data found.  Updated Vital Signs BP 116/78 (BP Location: Left Arm)    Pulse 84    Temp 98.6 F (37 C) (Oral)    Ht 5\' 6"  (1.676 m)    Wt 74.8 kg    LMP 05/10/2021    SpO2 99%    BMI 26.63 kg/m   Visual Acuity Right Eye Distance:   Left Eye Distance:   Bilateral Distance:    Right Eye Near:   Left Eye Near:    Bilateral Near:     Physical Exam Vitals and nursing note reviewed.  Constitutional:      General: She is not in acute distress.    Appearance: She is not ill-appearing.  HENT:     Head: Atraumatic.  Eyes:     Conjunctiva/sclera: Conjunctivae normal.     Pupils: Pupils are equal, round, and reactive to light.  Cardiovascular:     Rate and Rhythm: Normal rate.  Pulmonary:     Effort: Pulmonary effort is normal.  Musculoskeletal:        General: No swelling.     Right ankle: Normal.     Right foot: Normal range of motion. Tenderness and bony tenderness present. No swelling, deformity, laceration or crepitus. Normal pulse.       Feet:     Comments: Right foot has tenderness to palpation dorsally over tarsals as well as extensor tendons.  Skin:    General: Skin is warm and dry.  Neurological:     Mental Status: She is alert.     UC Treatments / Results  Labs (all labs ordered are listed, but only abnormal results are displayed) Labs Reviewed - No data to display  EKG   Radiology DG Ankle Complete Right  Result Date: 05/21/2021 CLINICAL DATA:  Right ankle pain EXAM: RIGHT ANKLE - COMPLETE 3+ VIEW COMPARISON:  None. FINDINGS: There is no evidence of fracture, dislocation, or joint effusion. There is no evidence of arthropathy or other focal bone abnormality. Soft tissues are unremarkable. IMPRESSION: No acute osseous abnormality identified. Electronically Signed   By: Jannifer Hickelaney  Williams M.D.   On: 05/21/2021 10:50   DG Foot Complete Right  Result Date: 05/21/2021 CLINICAL DATA:  Foot pain EXAM: RIGHT FOOT  COMPLETE - 3+ VIEW COMPARISON:  None. FINDINGS: No acute fracture or dislocation identified. Mild-to-moderate narrowing of the first metatarsophalangeal joint with small marginal osteophytes. Plantar calcaneal spur. No focal soft tissue swelling visualized. IMPRESSION: Chronic changes with no acute osseous abnormality identified. Electronically Signed   By: Jannifer Hickelaney  Williams M.D.   On: 05/21/2021 10:50    Procedures Procedures (including critical care time)  Medications Ordered in UC Medications - No data to display  Initial Impression / Assessment and Plan / UC Course  I have reviewed the triage vital signs and the nursing notes.  Pertinent labs & imaging results that were available during my care of the patient were reviewed by me and considered in my medical decision making (see chart for details).    Ace wrap applied. Given sprain treatment instructions with range of motion and stretching exercises.  Followup with Dr. Rodney Langtonhomas Thekkekand[facility] ([facility]) if not improving about two weeks.   Final Clinical Impressions(s) / UC Diagnoses   Final diagnoses:  Right foot sprain, initial encounter     Discharge Instructions      Apply ice pack for 30 minutes 2 to 3 times daily until swelling decreases.  Elevate when  possible.  Use crutches for 3 to 5 days.  Wear Ace wrap until swelling decreases.  Begin range of motion and stretching exercises in about 5 days as per instruction sheet.  May take Ibuprofen 200mg , 4 tabs every 8 hours with food.      ED Prescriptions   None       , MD 05/22/21 [phone]

## 2021-05-23 ENCOUNTER — Other Ambulatory Visit: Payer: Self-pay

## 2021-05-23 ENCOUNTER — Encounter (HOSPITAL_COMMUNITY): Payer: Self-pay | Admitting: Licensed Clinical Social Worker

## 2021-05-23 ENCOUNTER — Ambulatory Visit (INDEPENDENT_AMBULATORY_CARE_PROVIDER_SITE_OTHER): Payer: No Typology Code available for payment source | Admitting: Licensed Clinical Social Worker

## 2021-05-23 DIAGNOSIS — F411 Generalized anxiety disorder: Secondary | ICD-10-CM | POA: Diagnosis not present

## 2021-05-23 NOTE — Progress Notes (Signed)
Virtual Visit via Video Note  I connected with Cindy Jones on 05/23/21 at 1:00pm EST by a video enabled telemedicine application and verified that I am speaking with the correct person using two identifiers.   I discussed the limitations of evaluation and management by telemedicine and the availability of in person appointments. The patient expressed understanding and agreed to proceed.  LOCATION: Patient: Home Provider: Home Office  History of Present Illness: Pt was referred to therapy by her psychiatrist at [facility] for anxiety and panic attacks.   Participation Level: Active   Type of Therapy: Virtual Video individual therapy  Treatment Goals addressed: Improve psychiatric symptoms, Controlled Behavior, Moderated Mood, Improve Unhelpful Thought Patterns, Emotional Regulation Skills (Moderate moods, anger management, stress management), Feel and express a full Range of Emotions, Learn about Diagnosis, Healthy Coping Skills.  Interventions: CBT/Supportive/Psychoeducation  Summary: Patient presented for todays session on time and was alert, oriented x5, with no evidence or self-report of SI/HI or A/V H.    Clinician inquired about patients current emotional ratings, as well as any significant changes in thoughts, feelings or behavior since previous session. Patient's ratings  1/10 for depression, 6/10 for anxiety, 3/10 for anger/irritability, and denied any reoccurrence of panic attacks. Cln and pt explored emotional ratings and coping skills. Pt provided an update on family, work, stressors, goals. Pt reports she has hurt her foot and is on crutches. "This is difficult for me, because I can't do anything and I have to depend on my husband." Cln and pt explored "giving up control and depending on others." "It has increased my anxiety symptoms." She reports her husband is not a very good caretaker and it makes her feel uncomfortable asking for help. CLn and pt explored and role-played asking for  what she needs. Cln also suggested using her mindful-based skills during this time.         Plan: abandonment  Plan: retraining your brain    Assessment and plan: Counselor will continue to meet with patient to address treatment plan goals. Patient will continue to follow recommendations of providers and implement skills learned in session.  Suicidal/Homicidal: Nowithout intent/plan  Therapist Response: Assessed pt's current functioning and reviewed progress.. Assisted pt processing stressors, negative thoughts.  Assisted pt processing for the management of her stressors.  Participation Level: Active  Diagnosis: Axis I.:  GAD    Follow Up Instructions: I discussed the assessment and treatment plan with the patient. The patient was provided an opportunity to ask questions and all were answered. The patient agreed with the plan and demonstrated an understanding of the instructions.   The patient was advised to call back or seek an in-person evaluation if the symptoms worsen or if the condition fails to improve as anticipated.  I provided 45 minutes of non-face-to-face time during this encounter.   Abdulhamid Olgin S, LCAS

## 2021-05-30 ENCOUNTER — Encounter (HOSPITAL_COMMUNITY): Payer: Self-pay | Admitting: Licensed Clinical Social Worker

## 2021-05-30 ENCOUNTER — Ambulatory Visit (INDEPENDENT_AMBULATORY_CARE_PROVIDER_SITE_OTHER): Payer: No Typology Code available for payment source | Admitting: Licensed Clinical Social Worker

## 2021-05-30 ENCOUNTER — Other Ambulatory Visit: Payer: Self-pay

## 2021-05-30 DIAGNOSIS — F411 Generalized anxiety disorder: Secondary | ICD-10-CM | POA: Diagnosis not present

## 2021-05-30 NOTE — Progress Notes (Signed)
Virtual Visit via Video Note  I connected with Cindy Jones on 05/30/21 at 1:00pm EST by a video enabled telemedicine application and verified that I am speaking with the correct person using two identifiers.   I discussed the limitations of evaluation and management by telemedicine and the availability of in person appointments. The patient expressed understanding and agreed to proceed.  LOCATION: Patient: Home Provider: Home Office  History of Present Illness: Pt was referred to therapy by her psychiatrist at [facility] for anxiety and panic attacks.   Participation Level: Active   Type of Therapy: Virtual Video individual therapy  Treatment Goals addressed: Improve psychiatric symptoms, Controlled Behavior, Moderated Mood, Improve Unhelpful Thought Patterns, Emotional Regulation Skills (Moderate moods, anger management, stress management), Feel and express a full Range of Emotions, Learn about Diagnosis, Healthy Coping Skills.  Interventions: CBT/Supportive/Psychoeducation  Summary: Patient presented for todays session on time and was alert, oriented x5, with no evidence or self-report of SI/HI or A/V H.    Clinician inquired about patients current emotional ratings, as well as any significant changes in thoughts, feelings or behavior since previous session. Patient's ratings  1/10 for depression, 6/10 for anxiety, 3/10 for anger/irritability, and denied any reoccurrence of panic attacks. Cln and pt explored emotional ratings and coping skills. Pt provided an update on family, work, stressors, goals. Pt began a conversation about her father, who passed away when pt was 8. Pt described the family influences she had after the death of her father. CLn and pt explored the roles that each family member took in her development: eating disorder, being judgmental of people. "My mother couldn't take care of me because she was either drunk or high most of my child/teen years. CLn used open-ended  questions.     Plan: retraining your brain    Assessment and plan: Counselor will continue to meet with patient to address treatment plan goals. Patient will continue to follow recommendations of providers and implement skills learned in session.  Suicidal/Homicidal: Nowithout intent/plan  Therapist Response: Assessed pt's current functioning and reviewed progress.. Assisted pt processing stressors, negative thoughts.  Assisted pt processing for the management of her stressors.  Participation Level: Active  Diagnosis: Axis I.:  GAD    Follow Up Instructions: I discussed the assessment and treatment plan with the patient. The patient was provided an opportunity to ask questions and all were answered. The patient agreed with the plan and demonstrated an understanding of the instructions.   The patient was advised to call back or seek an in-person evaluation if the symptoms worsen or if the condition fails to improve as anticipated.  I provided 45 minutes of non-face-to-face time during this encounter.   Michaeline Eckersley S, LCAS

## 2021-06-06 ENCOUNTER — Other Ambulatory Visit: Payer: Self-pay

## 2021-06-06 ENCOUNTER — Encounter (HOSPITAL_COMMUNITY): Payer: Self-pay | Admitting: Licensed Clinical Social Worker

## 2021-06-06 ENCOUNTER — Ambulatory Visit (INDEPENDENT_AMBULATORY_CARE_PROVIDER_SITE_OTHER): Payer: No Typology Code available for payment source | Admitting: Licensed Clinical Social Worker

## 2021-06-06 DIAGNOSIS — F411 Generalized anxiety disorder: Secondary | ICD-10-CM | POA: Diagnosis not present

## 2021-06-06 NOTE — Progress Notes (Signed)
Virtual Visit via Video Note  I connected with Cindy Jones on 06/06/21 at 1:00pm EST by a video enabled telemedicine application and verified that I am speaking with the correct person using two identifiers.   I discussed the limitations of evaluation and management by telemedicine and the availability of in person appointments. The patient expressed understanding and agreed to proceed.  LOCATION: Patient: Home Provider: Home Office  History of Present Illness: Pt was referred to therapy by her psychiatrist at [facility] for anxiety and panic attacks.   Participation Level: Active   Type of Therapy: Virtual Video individual therapy  Treatment Goals addressed: Improve psychiatric symptoms, Controlled Behavior, Moderated Mood, Improve Unhelpful Thought Patterns, Emotional Regulation Skills (Moderate moods, anger management, stress management), Feel and express a full Range of Emotions, Learn about Diagnosis, Healthy Coping Skills.  Interventions: CBT/Supportive/Psychoeducation  Summary: Patient presented for todays session on time and was alert, oriented x5, with no evidence or self-report of SI/HI or A/V H.    Clinician inquired about patients current emotional ratings, as well as any significant changes in thoughts, feelings or behavior since previous session. Patient's ratings  1/10 for depression, 5/10 for anxiety, 1/10 for anger/irritability, and denied any reoccurrence of panic attacks. Cln and pt explored emotional ratings and coping skills. Pt provided an update on family, work, stressors, goals. Pt reports again, communication errors within her marital relationship. Cln used open-ended questions. Cln used CBT to help pt focus on reviewing and reworking unhelpful thinking styles and behavioural habits that pt has used in the past.   Plan: retraining your brain    Assessment and plan: Counselor will continue to meet with patient to address treatment plan goals. Patient will continue to  follow recommendations of providers and implement skills learned in session.  Suicidal/Homicidal: Nowithout intent/plan  Therapist Response: Assessed pt's current functioning and reviewed progress.. Assisted pt processing stressors, negative thoughts.  Assisted pt processing for the management of her stressors.  Participation Level: Active  Diagnosis: Axis I.:  GAD    Follow Up Instructions: I discussed the assessment and treatment plan with the patient. The patient was provided an opportunity to ask questions and all were answered. The patient agreed with the plan and demonstrated an understanding of the instructions.   The patient was advised to call back or seek an in-person evaluation if the symptoms worsen or if the condition fails to improve as anticipated.  I provided 45 minutes of non-face-to-face time during this encounter.   Shakaria Raphael S, LCAS

## 2021-06-13 ENCOUNTER — Ambulatory Visit (INDEPENDENT_AMBULATORY_CARE_PROVIDER_SITE_OTHER): Payer: No Typology Code available for payment source | Admitting: Licensed Clinical Social Worker

## 2021-06-13 ENCOUNTER — Other Ambulatory Visit: Payer: Self-pay

## 2021-06-13 ENCOUNTER — Encounter (HOSPITAL_COMMUNITY): Payer: Self-pay | Admitting: Licensed Clinical Social Worker

## 2021-06-13 DIAGNOSIS — F411 Generalized anxiety disorder: Secondary | ICD-10-CM | POA: Diagnosis not present

## 2021-06-13 NOTE — Progress Notes (Signed)
Virtual Visit via Video Note  I connected with Cindy Jones on 06/13/21 at 1:00pm EST by a video enabled telemedicine application and verified that I am speaking with the correct person using two identifiers.   I discussed the limitations of evaluation and management by telemedicine and the availability of in person appointments. The patient expressed understanding and agreed to proceed.  LOCATION: Patient: Home Provider: Home Office  History of Present Illness: Pt was referred to therapy by her psychiatrist at [facility] for anxiety and panic attacks.   Participation Level: Active   Type of Therapy: Virtual Video individual therapy  Treatment Goals addressed: Improve psychiatric symptoms, Controlled Behavior, Moderated Mood, Improve Unhelpful Thought Patterns, Emotional Regulation Skills (Moderate moods, anger management, stress management), Feel and express a full Range of Emotions, Learn about Diagnosis, Healthy Coping Skills.  Interventions: CBT/Supportive/Psychoeducation  Summary: Patient presented for todays session on time and was alert, oriented x5, with no evidence or self-report of SI/HI or A/V H.    Clinician inquired about patients current emotional ratings, as well as any significant changes in thoughts, feelings or behavior since previous session. Patient's ratings  1/10 for depression, 5/10 for anxiety, 1/10 for anger/irritability, and denied any reoccurrence of panic attacks. Cln and pt explored emotional ratings and coping skills. Pt provided an update on family, work, stressors, goals. Patient and husband have been having good communication within their relationship. Cln and pt reviewed communication errors from the past. Cln and pt explored hobbies for pt to participate in, now that her husband is in graduate school. Cln suggested new hobbies so that pt will not become resentful of limited amount of time with her husband which can be a hardship on their relationship. Cln and pt  explored new friendship circle that she and her husband are experiencing, which is positive, leading to new experiences. Clinician utilized [place] OARS to reflect and summarize thoughts and feelings.    Plan: retraining your brain    Assessment and plan: Counselor will continue to meet with patient to address treatment plan goals. Patient will continue to follow recommendations of providers and implement skills learned in session.  Suicidal/Homicidal: Nowithout intent/plan  Therapist Response: Assessed pt's current functioning and reviewed progress.. Assisted pt processing stressors, negative thoughts.  Assisted pt processing for the management of her stressors.  Participation Level: Active  Diagnosis: Axis I.:  GAD    Follow Up Instructions: I discussed the assessment and treatment plan with the patient. The patient was provided an opportunity to ask questions and all were answered. The patient agreed with the plan and demonstrated an understanding of the instructions.   The patient was advised to call back or seek an in-person evaluation if the symptoms worsen or if the condition fails to improve as anticipated.  I provided 45 minutes of non-face-to-face time during this encounter.   Nadege Carriger S, LCAS

## 2021-06-16 ENCOUNTER — Other Ambulatory Visit: Payer: Self-pay

## 2021-06-16 ENCOUNTER — Other Ambulatory Visit (HOSPITAL_COMMUNITY): Payer: Self-pay

## 2021-06-16 ENCOUNTER — Encounter (HOSPITAL_COMMUNITY): Payer: Self-pay | Admitting: Psychiatry

## 2021-06-16 ENCOUNTER — Telehealth (HOSPITAL_BASED_OUTPATIENT_CLINIC_OR_DEPARTMENT_OTHER): Payer: No Typology Code available for payment source | Admitting: Psychiatry

## 2021-06-16 DIAGNOSIS — F988 Other specified behavioral and emotional disorders with onset usually occurring in childhood and adolescence: Secondary | ICD-10-CM

## 2021-06-16 DIAGNOSIS — F410 Panic disorder [episodic paroxysmal anxiety] without agoraphobia: Secondary | ICD-10-CM

## 2021-06-16 DIAGNOSIS — F502 Bulimia nervosa: Secondary | ICD-10-CM

## 2021-06-16 DIAGNOSIS — F952 Tourette's disorder: Secondary | ICD-10-CM | POA: Diagnosis not present

## 2021-06-16 DIAGNOSIS — F411 Generalized anxiety disorder: Secondary | ICD-10-CM | POA: Diagnosis not present

## 2021-06-16 MED ORDER — AMPHETAMINE-DEXTROAMPHET ER 10 MG PO CP24
10.0000 mg | ORAL_CAPSULE | Freq: Every day | ORAL | 0 refills | Status: DC
  Filled 2021-06-16 – 2021-07-18 (×3): qty 30, 30d supply, fill #0

## 2021-06-16 MED ORDER — AMPHETAMINE-DEXTROAMPHET ER 15 MG PO CP24
15.0000 mg | ORAL_CAPSULE | ORAL | 0 refills | Status: DC
  Filled 2021-06-16 – 2021-06-20 (×2): qty 30, 30d supply, fill #0

## 2021-06-16 MED ORDER — AMPHETAMINE-DEXTROAMPHET ER 5 MG PO CP24
5.0000 mg | ORAL_CAPSULE | Freq: Every day | ORAL | 0 refills | Status: DC
  Filled 2021-06-16 – 2021-08-18 (×2): qty 30, 30d supply, fill #0

## 2021-06-16 NOTE — Progress Notes (Signed)
Virtual Visit via Video Note  I connected with Cindy Jones on 06/16/21 at 11:30 AM EST by a video enabled telemedicine application and verified that I am speaking with the correct person using two identifiers.  Location: Patient: home Provider: office   I discussed the limitations of evaluation and management by telemedicine and the availability of in person appointments. The patient expressed understanding and agreed to proceed.  History of Present Illness: "Good". Cindy Jones has not noticed much of a difference between Adderall XR 25mg  to 20mg . She remains productive and focused at work. She is really likes her new job and enjoys how challenging it is. Cindy Jones does start to feel tired and less motivated towards the end of the day. She has more neck pain, eye rolling/blinking, holding her breath more when the Adderall wears off. Some days she notices it all day. It is better when she sleeps and eats well. She makes an effort to take breaks from screen time. It is manageable. Her anxiety is mostly situational and is manageable when it comes on. She has had a few mild panic attacks that were stress induced. She denies depression and anhedonia. Cindy Jones denies SI/HI. Cindy Jones is eating 3 healthy meals/day and is not binging or purging. She has not been focused on her weight.    Observations/Objective: Psychiatric Specialty Exam: ROS  There were no vitals taken for this visit.There is no height or weight on file to calculate BMI.  General Appearance: Fairly Groomed and Neat  Eye Contact:  Good  Speech:  Clear and Coherent and Normal Rate  Volume:  Normal  Mood:  Euthymic  Affect:  Full Range  Thought Process:  Goal Directed, Linear, and Descriptions of Associations: Intact  Orientation:  Full (Time, Place, and Person)  Thought Content:  Logical  Suicidal Thoughts:  No  Homicidal Thoughts:  No  Memory:  Immediate;   Good  Judgement:  Good  Insight:  Good  Psychomotor Activity:  Normal   Concentration:  Concentration: Good  Recall:  Good  Fund of Knowledge:  Good  Language:  Good  Akathisia:  No  Handed:  Right  AIMS (if indicated):     Assets:  Communication Skills Desire for Improvement Financial Resources/Insurance Housing Intimacy Leisure Time Physical Health Resilience Social Support Talents/Skills Transportation Vocational/Educational  ADL's:  Intact  Cognition:  WNL  Sleep:        Assessment and Plan: Depression screen [facility] 2/9 06/16/2021 04/07/2021 10/14/2020 08/19/2020 07/22/2020  Decreased Interest 0 0 0 0 0  Down, Depressed, Hopeless 0 0 0 0 0  PHQ - 2 Score 0 0 0 0 0  Some recent data might be hidden    Flowsheet Row Video Visit from [facility]/16/2021 [facility] from 05/21/2021 in [facility] Video Visit from 04/07/2021 in [facility]  C-SSRS RISK CATEGORY No Risk No Risk No Risk        - decrease Adderall XR 15mg  po qD. Goal is to wean off all stimulants before trying to get pregnant. Next month decrease to Adderall XR 10mg  po qD then in April go down to Adderall XR 5mg  qD and then stop.   1. ADD (attention deficit disorder) without hyperactivity - amphetamine-dextroamphetamine (ADDERALL XR) 5 MG 24 hr capsule; Take 1 capsule (5 mg total) by mouth daily.  Dispense: 30 capsule; Refill: 0 - amphetamine-dextroamphetamine (ADDERALL XR) 10 MG 24 hr capsule; Take 1 capsule  by mouth daily.  Dispense: 30 capsule; Refill: 0 - amphetamine-dextroamphetamine (ADDERALL XR) 15 MG 24 hr capsule; Take 1 capsule by mouth every morning.  Dispense: 30 capsule; Refill: 0  2. Generalized anxiety disorder  3. Panic attacks  4. Tourette disease  5. Bulimia nervosa    Follow Up Instructions: In 2-3 months or sooner if needed   I discussed the assessment and treatment plan with the patient. The patient was provided an opportunity to ask questions and  all were answered. The patient agreed with the plan and demonstrated an understanding of the instructions.   The patient was advised to call back or seek an in-person evaluation if the symptoms worsen or if the condition fails to improve as anticipated.  I provided 18 minutes of non-face-to-face time during this encounter.   Oletta Darter, MD

## 2021-06-17 ENCOUNTER — Other Ambulatory Visit (HOSPITAL_COMMUNITY): Payer: Self-pay

## 2021-06-20 ENCOUNTER — Ambulatory Visit (INDEPENDENT_AMBULATORY_CARE_PROVIDER_SITE_OTHER): Payer: No Typology Code available for payment source | Admitting: Licensed Clinical Social Worker

## 2021-06-20 ENCOUNTER — Other Ambulatory Visit (HOSPITAL_COMMUNITY): Payer: Self-pay

## 2021-06-20 ENCOUNTER — Other Ambulatory Visit: Payer: Self-pay

## 2021-06-20 ENCOUNTER — Encounter (HOSPITAL_COMMUNITY): Payer: Self-pay | Admitting: Licensed Clinical Social Worker

## 2021-06-20 DIAGNOSIS — F411 Generalized anxiety disorder: Secondary | ICD-10-CM

## 2021-06-20 NOTE — Progress Notes (Signed)
Virtual Visit via Video Note  I connected with Cindy Jones on 06/20/21 at 1:00pm EST by a video enabled telemedicine application and verified that I am speaking with the correct person using two identifiers.   I discussed the limitations of evaluation and management by telemedicine and the availability of in person appointments. The patient expressed understanding and agreed to proceed.  LOCATION: Patient: Home Provider: Home Office  History of Present Illness: Pt was referred to therapy by her psychiatrist at [facility] for anxiety and panic attacks.   Participation Level: Active   Type of Therapy: Virtual Video individual therapy  Treatment Goals addressed: Improve psychiatric symptoms, Controlled Behavior, Moderated Mood, Improve Unhelpful Thought Patterns, Emotional Regulation Skills (Moderate moods, anger management, stress management), Feel and express a full Range of Emotions, Learn about Diagnosis, Healthy Coping Skills.  Interventions: CBT/Supportive/Psychoeducation  Summary: Patient presented for todays session on time and was alert, oriented x5, with no evidence or self-report of SI/HI or A/V H.    Clinician inquired about patients current emotional ratings, as well as any significant changes in thoughts, feelings or behavior since previous session. Patient's ratings  1/10 for depression, 7/10 for anxiety, 4/10 for anger/irritability, and denied any reoccurrence of panic attacks. Cln and pt explored emotional ratings and coping skills. Pt provided an update on family, work, stressors, goals. Patient reports her emotional rating of anxiety and irritability have increased exponentially. "I received a letter from my former best friend, who ended our friendship 9 mos ago and I'm feeling extremely anxious and irritable." Pt read the letter and talked of her emotions. Clinician utilized CBT to address her thought processes. Clinician provided thought stopping tools, as well as reality testing  to provide support and confidence in her decisions of how to respond to the letter, or if to respond. Cln and pt role played responses to the letter.      Plan: retraining your brain    Assessment and plan: Counselor will continue to meet with patient to address treatment plan goals. Patient will continue to follow recommendations of providers and implement skills learned in session.  Suicidal/Homicidal: Nowithout intent/plan  Therapist Response: Assessed pt's current functioning and reviewed progress.. Assisted pt processing stressors, negative thoughts.  Assisted pt processing for the management of her stressors.  Participation Level: Active  Diagnosis: Axis I.:  GAD    Follow Up Instructions: I discussed the assessment and treatment plan with the patient. The patient was provided an opportunity to ask questions and all were answered. The patient agreed with the plan and demonstrated an understanding of the instructions.   The patient was advised to call back or seek an in-person evaluation if the symptoms worsen or if the condition fails to improve as anticipated.  I provided 45 minutes of non-face-to-face time during this encounter.   Sarina Robleto S, LCAS

## 2021-06-27 ENCOUNTER — Ambulatory Visit (INDEPENDENT_AMBULATORY_CARE_PROVIDER_SITE_OTHER): Payer: No Typology Code available for payment source | Admitting: Licensed Clinical Social Worker

## 2021-06-27 ENCOUNTER — Other Ambulatory Visit: Payer: Self-pay

## 2021-06-27 DIAGNOSIS — F411 Generalized anxiety disorder: Secondary | ICD-10-CM

## 2021-06-28 ENCOUNTER — Encounter (HOSPITAL_COMMUNITY): Payer: Self-pay | Admitting: Licensed Clinical Social Worker

## 2021-06-28 NOTE — Progress Notes (Signed)
Virtual Visit via Video Note  I connected with Cindy Jones on 06/28/21 at 4:00pm EST by a video enabled telemedicine application and verified that I am speaking with the correct person using two identifiers.   I discussed the limitations of evaluation and management by telemedicine and the availability of in person appointments. The patient expressed understanding and agreed to proceed.  LOCATION: Patient: Home Provider: Home Office  History of Present Illness: Pt was referred to therapy by her psychiatrist at [facility] for anxiety and panic attacks.   Participation Level: Active   Type of Therapy: Virtual Video individual therapy   Treatment Goal Addressed: Increase her self-confidence in coping with irrational fears AEB self report.  Interventions: CBT/Supportive/Psychoeducation  Summary: Patient presented for todays session on time and was alert, oriented x5, with no evidence or self-report of SI/HI or A/V H.    Clinician inquired about patients current emotional ratings, as well as any significant changes in thoughts, feelings or behavior since previous session. Patient's ratings  1/10 for depression, 7/10 for anxiety, 4/10 for anger/irritability, and denied any reoccurrence of panic attacks. Cln and pt explored emotional ratings and coping skills. Pt provided an update on family, work, stressors, goals. Patient reports her emotional rating of anxiety and irritability remain high. Pt again, discussed the letter she received from former friend and how she wants to respond. "I'm afraid of how to respond or if I want to respond." Cln used CBT to assist patient with her irrational fears. Pt's husband joined session to talk about his fears for his wife to respond to the letter. "Their friendship was toxic and I don't want that back in our life." Pt and her husband talked to each other about both of their fears. Clinician utilized MI OARS to affirm their concerns.    Collaboration of Care: Other  no collaboration was needed at this session  Patient/Guardian was advised Release of Information must be obtained prior to any record release in order to collaborate their care with an outside provider. Patient/Guardian was advised if they have not already done so to contact the registration department to sign all necessary forms in order for [place] to release information regarding their care.   Consent: Patient/Guardian gives verbal consent for treatment and assignment of benefits for services provided during this visit. Patient/Guardian expressed understanding and agreed to proceed.      Assessment and plan: Counselor will continue to meet with patient to address treatment plan goals. Patient will continue to follow recommendations of providers and implement skills learned in session.  Suicidal/Homicidal: Nowithout intent/plan  Therapist Response: Assessed pt's current functioning and reviewed progress.. Assisted pt processing stressors, negative thoughts.  Assisted pt processing for the management of her stressors.  Participation Level: Active  Diagnosis: Axis I.:  GAD    Follow Up Instructions: I discussed the assessment and treatment plan with the patient. The patient was provided an opportunity to ask questions and all were answered. The patient agreed with the plan and demonstrated an understanding of the instructions.   The patient was advised to call back or seek an in-person evaluation if the symptoms worsen or if the condition fails to improve as anticipated.  I provided 45 minutes of non-face-to-face time during this encounter.   Skylan Gift S, LCAS

## 2021-07-04 ENCOUNTER — Encounter (HOSPITAL_COMMUNITY): Payer: Self-pay | Admitting: Licensed Clinical Social Worker

## 2021-07-04 ENCOUNTER — Ambulatory Visit (INDEPENDENT_AMBULATORY_CARE_PROVIDER_SITE_OTHER): Payer: No Typology Code available for payment source | Admitting: Licensed Clinical Social Worker

## 2021-07-04 ENCOUNTER — Other Ambulatory Visit: Payer: Self-pay

## 2021-07-04 DIAGNOSIS — F411 Generalized anxiety disorder: Secondary | ICD-10-CM

## 2021-07-06 NOTE — Progress Notes (Signed)
Virtual Visit via Video Note ? ?I connected with Cindy Jones on 07/04/21 at 4:00pm EST by a video enabled telemedicine application and verified that I am speaking with the correct person using two identifiers. ?  ?I discussed the limitations of evaluation and management by telemedicine and the availability of in person appointments. The patient expressed understanding and agreed to proceed. ? ?LOCATION: ?Patient: Home ?Provider: Home Office ? ?History of Present Illness: Pt was referred to therapy by her psychiatrist at [facility] for anxiety and panic attacks. ? ? ?Participation Level: Active ? ? ?Type of Therapy: Virtual Video individual therapy ? ? ?Treatment Goal Addressed: Pt will verbalize an understanding of how thoughts, physical feelings, and behavioral actions contribute to anxiety  ? ?Interventions: CBT/Supportive/Psychoeducation ? ?Summary: Patient presented for today?s session on time and was alert, oriented x5, with no evidence or self-report of SI/HI or A/V H. Clinician inquired about patient?s current emotional ratings, as well as any significant changes in thoughts, feelings or behavior since previous session. Patient's ratings 1/10 for depression, 7/10 for anxiety, 4/10 for anger/irritability, and denied any of panic attacks. Cln and pt explored emotional ratings and coping skills. Pt provided an update on family, work, stressors, goals. Pt continued to talk about her response to the letter she received from a previous toxic friendship. Cln asked open ended questions. Clinician utilized MI OARS to reflect and summarize thoughts and feelings about the letter and previous relationship. Clinician discussed the importance of focusing on the here and now, what she has control of. Cln suggested pt used her mindfulness-based coping skills.  ? ? ?  ? ? ?Collaboration of Care: Other no collaboration was needed at this session ? ?Patient/Guardian was advised Release of Information must be obtained prior to any  record release in order to collaborate their care with an outside provider. Patient/Guardian was advised if they have not already done so to contact the registration department to sign all necessary forms in order for [place] to release information regarding their care.  ? ?Consent: Patient/Guardian gives verbal consent for treatment and assignment of benefits for services provided during this visit. Patient/Guardian expressed understanding and agreed to proceed.   ? ?  ?Assessment and plan: Counselor will continue to meet with patient to address treatment plan goals. Patient will continue to follow recommendations of providers and implement skills learned in session. ? ?Suicidal/Homicidal: Nowithout intent/plan ? ?Therapist Response: Assessed pt's current functioning and reviewed progress.. Assisted pt processing stressors, negative thoughts.  Assisted pt processing for the management of her stressors. ? ?Participation Level: Active ? ?Diagnosis: Axis I.:  GAD ? ?  ?Follow Up Instructions: I discussed the assessment and treatment plan with the patient. The patient was provided an opportunity to ask questions and all were answered. The patient agreed with the plan and demonstrated an understanding of the instructions. ?  ?The patient was advised to call back or seek an in-person evaluation if the symptoms worsen or if the condition fails to improve as anticipated. ? ?I provided 45 minutes of non-face-to-face time during this encounter. ? ? ?Bacilio Abascal S, LCAS ? ? ? ? ? ? ? ? ? ? ? ?

## 2021-07-11 ENCOUNTER — Other Ambulatory Visit: Payer: Self-pay

## 2021-07-11 ENCOUNTER — Ambulatory Visit (HOSPITAL_COMMUNITY): Payer: No Typology Code available for payment source | Admitting: Licensed Clinical Social Worker

## 2021-07-18 ENCOUNTER — Other Ambulatory Visit: Payer: Self-pay

## 2021-07-18 ENCOUNTER — Other Ambulatory Visit (HOSPITAL_COMMUNITY): Payer: Self-pay

## 2021-07-18 ENCOUNTER — Ambulatory Visit (INDEPENDENT_AMBULATORY_CARE_PROVIDER_SITE_OTHER): Payer: No Typology Code available for payment source | Admitting: Licensed Clinical Social Worker

## 2021-07-18 ENCOUNTER — Telehealth: Payer: Self-pay

## 2021-07-18 ENCOUNTER — Encounter (HOSPITAL_COMMUNITY): Payer: Self-pay | Admitting: Licensed Clinical Social Worker

## 2021-07-18 DIAGNOSIS — F411 Generalized anxiety disorder: Secondary | ICD-10-CM

## 2021-07-18 NOTE — Telephone Encounter (Signed)
Attempted to reach patient regarding message left with answering service on 07/15/21 at 12:08. Left message for patient to call office or send mychart message with details regarding appointment needs.  ?

## 2021-07-19 NOTE — Progress Notes (Signed)
Virtual Visit via Video Note ? ?I connected with Cindy Jones on 07/18/21 at 1:00pm EST by a video enabled telemedicine application and verified that I am speaking with the correct person using two identifiers. ?  ?I discussed the limitations of evaluation and management by telemedicine and the availability of in person appointments. The patient expressed understanding and agreed to proceed. ? ?LOCATION: ?Patient: Home ?Provider: Home Office ? ?History of Present Illness: Pt was referred to therapy by her psychiatrist at [facility] for anxiety and panic attacks. ? ? ?Participation Level: Active ? ? ?Type of Therapy: Virtual Video individual therapy ? ? ?Treatment Goal Addressed: Pt will verbalize an understanding of how thoughts, physical feelings, and behavioral actions contribute to anxiety  ? ?Interventions: CBT/Supportive/Psychoeducation ? ?Summary: Patient presented for today?s session on time and was alert, oriented x5, with no evidence or self-report of SI/HI or A/V H. Clinician inquired about patient?s current emotional ratings, as well as any significant changes in thoughts, feelings or behavior since previous session. Patient's ratings 1/10 for depression, 7/10 for anxiety, 4/10 for anger/irritability, and denied any of panic attacks. Cln and pt explored emotional ratings and coping skills. Pt provided an update on family, work, stressors, goals. Pt reports, "my husband and I had a fight this morning." Cln asked open-ended questions. Pt asked if her husband could join the session. Cln provided a safe platform for them to talk to each other with cln as a mediator, pointing out communication errors and redirecting. ? ? ?  ? ? ?Collaboration of Care: Other no collaboration was needed at this session ? ?Patient/Guardian was advised Release of Information must be obtained prior to any record release in order to collaborate their care with an outside provider. Patient/Guardian was advised if they have not already done  so to contact the registration department to sign all necessary forms in order for [place] to release information regarding their care.  ? ?Consent: Patient/Guardian gives verbal consent for treatment and assignment of benefits for services provided during this visit. Patient/Guardian expressed understanding and agreed to proceed.   ? ?  ?Assessment and plan: Counselor will continue to meet with patient to address treatment plan goals. Patient will continue to follow recommendations of providers and implement skills learned in session. ? ?Suicidal/Homicidal: Nowithout intent/plan ? ?Therapist Response: Assessed pt's current functioning and reviewed progress.. Assisted pt processing stressors, negative thoughts.  Assisted pt processing for the management of her stressors. ? ?Participation Level: Active ? ?Diagnosis: Axis I.:  GAD ? ?  ?Follow Up Instructions: I discussed the assessment and treatment plan with the patient. The patient was provided an opportunity to ask questions and all were answered. The patient agreed with the plan and demonstrated an understanding of the instructions. ?  ?The patient was advised to call back or seek an in-person evaluation if the symptoms worsen or if the condition fails to improve as anticipated. ? ?I provided 60 minutes of non-face-to-face time during this encounter. ? ? ?Belen Pesch S, LCAS ? ? ? ? ? ? ? ? ? ? ? ?

## 2021-07-25 ENCOUNTER — Ambulatory Visit (INDEPENDENT_AMBULATORY_CARE_PROVIDER_SITE_OTHER): Payer: No Typology Code available for payment source | Admitting: Licensed Clinical Social Worker

## 2021-07-25 ENCOUNTER — Other Ambulatory Visit: Payer: Self-pay

## 2021-07-25 DIAGNOSIS — F411 Generalized anxiety disorder: Secondary | ICD-10-CM | POA: Diagnosis not present

## 2021-07-26 ENCOUNTER — Encounter (HOSPITAL_COMMUNITY): Payer: Self-pay | Admitting: Licensed Clinical Social Worker

## 2021-07-26 NOTE — Progress Notes (Signed)
Virtual Visit via Video Note ? ?I connected with Cindy Jones on 07/25/21 at 1:00pm EST by a video enabled telemedicine application and verified that I am speaking with the correct person using two identifiers. ?  ?I discussed the limitations of evaluation and management by telemedicine and the availability of in person appointments. The patient expressed understanding and agreed to proceed. ? ?LOCATION: ?Patient: Home ?Provider: Home Office ? ?History of Present Illness: Pt was referred to therapy by her psychiatrist at [facility] for anxiety and panic attacks. ? ? ?Participation Level: Active ? ? ?Type of Therapy: Virtual Video individual therapy ? ? ?Treatment Goal Addressed: Pt will verbalize an understanding of how thoughts, physical feelings, and behavioral actions contribute to anxiety  ? ?Interventions: CBT/Supportive/Psychoeducation ? ?Summary: Patient presented for today?s session on time and was alert, oriented x5, with no evidence or self-report of SI/HI or A/V H. Clinician inquired about patient?s current emotional ratings, as well as any significant changes in thoughts, feelings or behavior since previous session. Patient's ratings 1/10 for depression, 5/10 for anxiety, 2/10 for anger/irritability, and denied any panic attacks. Cln and pt explored emotional ratings and coping skills. Pt provided an update on family, work, stressors, goals. Pt reports, "Dr. Michae Kava, psychiatrist, has decreased my adderall to 10 mg." Cln and pt reviewed increased symptoms of adhd, anxiety and Tourettes. Cln and pt reviewed coping skills to use instead of medication. Pt is decreasing her medication due to beginning stages of getting pregnant. Pt and her husband are going on a 2 week trip to [place] next week with another family. Cln and pt discussed effective communication skills, control issues and safety while on the trip. Cln and pt role played possible scenarios. ? ? ? ? ?Collaboration of Care: Other: Follow guidelines from  Dr. Michae Kava for titration off adderall ? ?Patient/Guardian was advised Release of Information must be obtained prior to any record release in order to collaborate their care with an outside provider. Patient/Guardian was advised if they have not already done so to contact the registration department to sign all necessary forms in order for [place] to release information regarding their care.  ? ?Consent: Patient/Guardian gives verbal consent for treatment and assignment of benefits for services provided during this visit. Patient/Guardian expressed understanding and agreed to proceed.   ? ?  ?Assessment and plan: Counselor will continue to meet with patient to address treatment plan goals. Patient will continue to follow recommendations of providers and implement skills learned in session. ? ?Suicidal/Homicidal: Nowithout intent/plan ? ?Therapist Response: Assessed pt's current functioning and reviewed progress.. Assisted pt processing stressors, negative thoughts.  Assisted pt processing for the management of her stressors. ? ?Participation Level: Active ? ?Diagnosis: Axis I.:  GAD ? ?  ?Follow Up Instructions: I discussed the assessment and treatment plan with the patient. The patient was provided an opportunity to ask questions and all were answered. The patient agreed with the plan and demonstrated an understanding of the instructions. ?  ?The patient was advised to call back or seek an in-person evaluation if the symptoms worsen or if the condition fails to improve as anticipated. ? ?I provided 60 minutes of non-face-to-face time during this encounter. ? ? ?Jennae Hakeem S, LCAS ? ? ? ? ? ? ? ? ? ? ? ?

## 2021-08-01 ENCOUNTER — Ambulatory Visit (HOSPITAL_COMMUNITY): Payer: No Typology Code available for payment source | Admitting: Licensed Clinical Social Worker

## 2021-08-09 ENCOUNTER — Ambulatory Visit: Payer: No Typology Code available for payment source | Admitting: Neurology

## 2021-08-15 ENCOUNTER — Ambulatory Visit (INDEPENDENT_AMBULATORY_CARE_PROVIDER_SITE_OTHER): Payer: No Typology Code available for payment source | Admitting: Licensed Clinical Social Worker

## 2021-08-15 ENCOUNTER — Encounter (HOSPITAL_COMMUNITY): Payer: Self-pay | Admitting: Licensed Clinical Social Worker

## 2021-08-15 DIAGNOSIS — F411 Generalized anxiety disorder: Secondary | ICD-10-CM

## 2021-08-15 NOTE — Progress Notes (Signed)
Virtual Visit via Video Note ? ?I connected with Cindy Jones on 08/15/21 at 1:00pm EST by a video enabled telemedicine application and verified that I am speaking with the correct person using two identifiers. ?  ?I discussed the limitations of evaluation and management by telemedicine and the availability of in person appointments. The patient expressed understanding and agreed to proceed. ? ?LOCATION: ?Patient: Home ?Provider: Home Office ? ?History of Present Illness: Pt was referred to therapy by her psychiatrist at [facility] for anxiety and panic attacks. ? ? ?Participation Level: Active ? ? ?Type of Therapy: Virtual Video individual therapy ? ? ?Treatment Goal Addressed: Pt will verbalize an understanding of how thoughts, physical feelings, and behavioral actions contribute to anxiety  ? ?Interventions: CBT/Supportive/Psychoeducation ? ?Summary: Patient presented for today?s session on time and was alert, oriented x5, with no evidence or self-report of SI/HI or A/V H. Clinician inquired about patient?s current emotional ratings, as well as any significant changes in thoughts, feelings or behavior since previous session. Patient's ratings 1/10 for depression, 4/10 for anxiety, 3/10 for anger/irritability, and denied any panic attacks. Cln and pt explored emotional ratings and coping skills. Pt provided an update on family, work, stressors, goals. Pt reports, "We just got home from our 2 week trip to [place]." Cln asked open-ended questions. "We got along well, I was not trying to control everything. I used my coping skills." Cln and pt reviewed her adderall decrease. Cln and pt reviewed symptoms of not being on full-dose adderall. "We can start trying to get pregnant in the next 2 months." Pt will verbalized how her thoughts, physical feelings and behavioral actions increased her anxiety: "I could tell this especially when we arrived in [place]." ? ? ? ? ? ? ? ?Collaboration of Care: Other: Follow guidelines from Dr.  Michae Kava for titration off adderall ? ?Patient/Guardian was advised Release of Information must be obtained prior to any record release in order to collaborate their care with an outside provider. Patient/Guardian was advised if they have not already done so to contact the registration department to sign all necessary forms in order for [place] to release information regarding their care.  ? ?Consent: Patient/Guardian gives verbal consent for treatment and assignment of benefits for services provided during this visit. Patient/Guardian expressed understanding and agreed to proceed.   ? ?  ?Assessment and plan: Counselor will continue to meet with patient to address treatment plan goals. Patient will continue to follow recommendations of providers and implement skills learned in session. ? ?Suicidal/Homicidal: Nowithout intent/plan ? ?Therapist Response: Assessed pt's current functioning and reviewed progress.. Assisted pt processing stressors, negative thoughts.  Assisted pt processing for the management of her stressors. ? ?Participation Level: Active ? ?Diagnosis: Axis I.:  GAD ? ?  ?Follow Up Instructions: I discussed the assessment and treatment plan with the patient. The patient was provided an opportunity to ask questions and all were answered. The patient agreed with the plan and demonstrated an understanding of the instructions. ?  ?The patient was advised to call back or seek an in-person evaluation if the symptoms worsen or if the condition fails to improve as anticipated. ? ?I provided 45 minutes of non-face-to-face time during this encounter. ? ? ?Cindy Jones S, LCAS ? ? ? ? ? ? ? ? ? ? ? ?

## 2021-08-18 ENCOUNTER — Other Ambulatory Visit (HOSPITAL_COMMUNITY): Payer: Self-pay

## 2021-08-18 ENCOUNTER — Telehealth (HOSPITAL_BASED_OUTPATIENT_CLINIC_OR_DEPARTMENT_OTHER): Payer: No Typology Code available for payment source | Admitting: Psychiatry

## 2021-08-18 DIAGNOSIS — F988 Other specified behavioral and emotional disorders with onset usually occurring in childhood and adolescence: Secondary | ICD-10-CM

## 2021-08-18 DIAGNOSIS — F411 Generalized anxiety disorder: Secondary | ICD-10-CM | POA: Diagnosis not present

## 2021-08-18 DIAGNOSIS — F952 Tourette's disorder: Secondary | ICD-10-CM | POA: Diagnosis not present

## 2021-08-18 DIAGNOSIS — F410 Panic disorder [episodic paroxysmal anxiety] without agoraphobia: Secondary | ICD-10-CM | POA: Diagnosis not present

## 2021-08-18 NOTE — Progress Notes (Signed)
Virtual Visit via Video Note ? ?I connected with Cindy Jones on 08/18/21 at 10:00 AM EDT by a video enabled telemedicine application and verified that I am speaking with the correct person using two identifiers. ? ?Location: ?Patient: home ?Provider: office ?  ?I discussed the limitations of evaluation and management by telemedicine and the availability of in person appointments. The patient expressed understanding and agreed to proceed. ? ?History of Present Illness: ?Cindy Jones is doing well. She returned from vacation to [place] 1 week ago. Decreasing Adderall XR 10mg  has been hard. She has not tried the 5mg  dose. It is not helping for long periods of time to concentrate. She gets spurts of focus where she tries to maximize her productivity. When she is unfocused she will walk the dog or do something else. Cindy Jones has noticed that at some times of the day where she is more productive than in the afternoon. She is able to multitask but not focus on any 1 things unless she has to. Her work productivity has not been affected yet but she is stressed by this. She is having more tics when stressed and anxious. She has had a few stress induced panic attacks but they were not bad. Lack of control in situations makes her anxiety go up. Cindy Jones is more emotional and more indecisive in general. She has a decreasing in motivation that is frustrating. Her appetite is good. The first few days of decreasing Adderall she had a mild increase in appetite. She has put on any weight or lost any weight. Cindy Jones denies depression, anhedonia and hopelessness. She denies SI/HI.  ?  ?Observations/Objective: ?Psychiatric Specialty Exam: ?ROS  ?There were no vitals taken for this visit.There is no height or weight on file to calculate BMI.  ?General Appearance: Fairly Groomed and Neat  ?Eye Contact:  Good  ?Speech:  Clear and Coherent and Normal Rate  ?Volume:  Normal  ?Mood:  Euthymic  ?Affect:  Full Range  ?Thought Process:  Coherent and  Descriptions of Associations: Circumstantial  ?Orientation:  Full (Time, Place, and Person)  ?Thought Content:  Logical  ?Suicidal Thoughts:  No  ?Homicidal Thoughts:  No  ?Memory:  Immediate;   Good  ?Judgement:  Good  ?Insight:  Good  ?Psychomotor Activity:  Normal  ?Concentration:  Concentration: Good  ?Recall:  Good  ?Fund of Knowledge:  Good  ?Language:  Good  ?Akathisia:  No  ?Handed:  Right  ?AIMS (if indicated):     ?Assets:  Communication Skills ?Desire for Improvement ?Financial Resources/Insurance ?Housing ?Intimacy ?Leisure Time ?Physical Health ?Resilience ?Social Support ?Talents/Skills ?Transportation ?Vocational/Educational  ?ADL's:  Intact  ?Cognition:  WNL  ?Sleep:     ? ? ? ?Assessment and Plan: ? ? ?  08/18/2021  ? 10:14 AM 06/16/2021  ? 11:43 AM 04/07/2021  ? 11:37 AM 10/14/2020  ? 10:41 AM 08/19/2020  ?  2:01 PM  ?Depression screen PHQ 2/9  ?Decreased Interest 0 0 0 0 0  ?Down, Depressed, Hopeless 0 0 0 0 0  ?PHQ - 2 Score 0 0 0 0 0  ? ? ?Flowsheet Row Video Visit [facility] Video Visit from 06/16/2021 in [facility] from 05/21/2021 in [facility] [place]  ?C-SSRS RISK CATEGORY No Risk No Risk No Risk  ? ?  ? ? ? ?The risk of un-intended pregnancy is low based on the fact that pt reports her husband uses condoms. Pt is  aware that these meds carry a teratogenic risk. Pt will discuss plan of action if she does or plans to become pregnant in the future. ? ?Status of current problems: worsening ADHD symptoms ? ?Meds:  ?1. ADD (attention deficit disorder) without hyperactivity ? ?2. Generalized anxiety disorder ? ?3. Tourette disease ? ?4. Panic attacks ? ? ?- Cindy Jones is going to try Adderall 5mg  to see if it helps otherwise she will stop Adderall completely. She plans to start trying to get pregnant next month and understands that use of Adderall is not recommended due to teratogenic  side effects. She is going to talk to her OB about using Daytrana patch for ADHD.  ? ? ? ?Therapy: brief supportive therapy provided. Discussed psychosocial stressors in detail.  ? ? ?Collaboration of Care: Other none today ? ?Patient/Guardian was advised Release of Information must be obtained prior to any record release in order to collaborate their care with an outside provider. Patient/Guardian was advised if they have not already done so to contact the registration department to sign all necessary forms in order for to release information regarding their care.  ? ?Consent: Patient/Guardian gives verbal consent for treatment and assignment of benefits for services provided during this visit. Patient/Guardian expressed understanding and agreed to proceed.  ? ? ? ? ?Follow Up Instructions: ?Follow up in 1-2 months or sooner if needed ?  ? ?I discussed the assessment and treatment plan with the patient. The patient was provided an opportunity to ask questions and all were answered. The patient agreed with the plan and demonstrated an understanding of the instructions. ?  ?The patient was advised to call back or seek an in-person evaluation if the symptoms worsen or if the condition fails to improve as anticipated. ? ?I provided 20 minutes of non-face-to-face time during this encounter. ? ? ?[place], MD ? ?

## 2021-08-22 ENCOUNTER — Encounter (HOSPITAL_COMMUNITY): Payer: Self-pay | Admitting: Licensed Clinical Social Worker

## 2021-08-22 ENCOUNTER — Ambulatory Visit (INDEPENDENT_AMBULATORY_CARE_PROVIDER_SITE_OTHER): Payer: No Typology Code available for payment source | Admitting: Licensed Clinical Social Worker

## 2021-08-22 DIAGNOSIS — F411 Generalized anxiety disorder: Secondary | ICD-10-CM | POA: Diagnosis not present

## 2021-08-22 NOTE — Progress Notes (Signed)
Virtual Visit via Video Note ? ?I connected with Cindy Jones on 08/22/21 at 1:00pm EST by a video enabled telemedicine application and verified that I am speaking with the correct person using two identifiers. ?  ?I discussed the limitations of evaluation and management by telemedicine and the availability of in person appointments. The patient expressed understanding and agreed to proceed. ? ?LOCATION: ?Patient: Home ?Provider: Home Office ? ?History of Present Illness: Pt was referred to therapy by her psychiatrist at [facility] for anxiety and panic attacks. ? ? ?Participation Level: Active ? ? ?Type of Therapy: Virtual Video individual therapy ? ? ?Treatment Goal Addressed: Pt will verbalize an understanding of how thoughts, physical feelings, and behavioral actions contribute to anxiety  ? ?Interventions: CBT/Supportive/Psychoeducation ? ?Summary: Patient presented for today?s session on time and was alert, oriented x5, with no evidence or self-report of SI/HI or A/V H. Clinician inquired about patient?s current emotional ratings, as well as any significant changes in thoughts, feelings or behavior since previous session. Patient's ratings 1/10 for depression, 3/10 for anxiety, 2/10 for anger/irritability, and denied any panic attacks. Cln and pt explored emotional ratings and coping skills. Pt provided an update on family, work, stressors, goals, medication decrease. "I'm down to 5mg  of Adderall, I'm not sure if I'll continue to take the 5mg  or not, discussed the benefits of taking it vs not taking it. Cln and pt reviewed symptoms of not being on adderall, including Tourettes symptoms. "We can start trying to get pregnant within the next month." Cln and pt discussed the journey. Clinician utilized MI OARS to reflect and summarize thoughts and feelings. ? ? ? ? ? ?Collaboration of Care: Other: None needed at this session. ? ?Patient/Guardian was advised Release of Information must be obtained prior to any record  release in order to collaborate their care with an outside provider. Patient/Guardian was advised if they have not already done so to contact the registration department to sign all necessary forms in order for to release information regarding their care.  ? ?Consent: Patient/Guardian gives verbal consent for treatment and assignment of benefits for services provided during this visit. Patient/Guardian expressed understanding and agreed to proceed.   ? ?  ?Assessment and plan: Counselor will continue to meet with patient to address treatment plan goals. Patient will continue to follow recommendations of providers and implement skills learned in session. ? ?Suicidal/Homicidal: Nowithout intent/plan ? ?Therapist Response: Assessed pt's current functioning and reviewed progress.. Assisted pt processing stressors, negative thoughts.  Assisted pt processing for the management of her stressors. ? ?Participation Level: Active ? ?Diagnosis: Axis I.:  GAD ? ?  ?Follow Up Instructions: I discussed the assessment and treatment plan with the patient. The patient was provided an opportunity to ask questions and all were answered. The patient agreed with the plan and demonstrated an understanding of the instructions. ?  ?The patient was advised to call back or seek an in-person evaluation if the symptoms worsen or if the condition fails to improve as anticipated. ? ?I provided 45 minutes of non-face-to-face time during this encounter. ? ? ?Eryk Beavers S, LCAS ? ? ? ? ? ? ? ? ? ? ? ?

## 2021-08-29 ENCOUNTER — Encounter (HOSPITAL_COMMUNITY): Payer: Self-pay | Admitting: Licensed Clinical Social Worker

## 2021-08-29 ENCOUNTER — Other Ambulatory Visit: Payer: Self-pay

## 2021-08-29 ENCOUNTER — Ambulatory Visit (INDEPENDENT_AMBULATORY_CARE_PROVIDER_SITE_OTHER): Payer: No Typology Code available for payment source | Admitting: Licensed Clinical Social Worker

## 2021-08-29 DIAGNOSIS — F411 Generalized anxiety disorder: Secondary | ICD-10-CM

## 2021-08-29 NOTE — Progress Notes (Signed)
Virtual Visit via Video Note ? ?I connected with Cindy Jones on 08/29/21 at 1:00pm EST by a video enabled telemedicine application and verified that I am speaking with the correct person using two identifiers. ?  ?I discussed the limitations of evaluation and management by telemedicine and the availability of in person appointments. The patient expressed understanding and agreed to proceed. ? ?LOCATION: ?Patient: Home ?Provider: Home Office ? ?History of Present Illness: Pt was referred to therapy by her psychiatrist at [facility] for anxiety and panic attacks. ? ? ?Participation Level: Active ? ? ?Type of Therapy: Virtual Video individual therapy ? ? ?Treatment Goal Addressed: Pt will verbalize an understanding of how thoughts, physical feelings, and behavioral actions contribute to anxiety  ? ?Interventions: CBT/Supportive/Psychoeducation ? ?Summary: Patient presented for today?s session on time and was alert, oriented x5, with no evidence or self-report of SI/HI or A/V H. Clinician inquired about patient?s current emotional ratings, as well as any significant changes in thoughts, feelings or behavior since previous session. Patient's ratings 1/10 for depression, 3/10 for anxiety, 1/10 for anger/irritability, and denied any panic attacks. Cln and pt explored emotional ratings and coping skills. Pt provided an update on family, work, stressors, goals, medication decrease. "I'm still taking 5mg  of Adderall." Cln and pt explored how she is managing her ADHD symptoms and her Tourettes symptoms. Pt bought "therapy and anxiety notebooks" to assist patient with journaling and preparing for therapy session, takeaways, goals. Part of the "notebook" discussed her distorted thinking, so cln and pt explored cognitive distortions, reviewing which she identifies with. Cln and pt will use both books during therapy sessions. ? ? ? ? ? ? ?Collaboration of Care: Other: None needed at this session. ? ?Patient/Guardian was advised Release  of Information must be obtained prior to any record release in order to collaborate their care with an outside provider. Patient/Guardian was advised if they have not already done so to contact the registration department to sign all necessary forms in order for [place] to release information regarding their care.  ? ?Consent: Patient/Guardian gives verbal consent for treatment and assignment of benefits for services provided during this visit. Patient/Guardian expressed understanding and agreed to proceed.   ? ?  ?Assessment and plan: Counselor will continue to meet with patient to address treatment plan goals. Patient will continue to follow recommendations of providers and implement skills learned in session. ? ?Suicidal/Homicidal: Nowithout intent/plan ? ?Therapist Response: Assessed pt's current functioning and reviewed progress.. Assisted pt processing stressors, negative thoughts.  Assisted pt processing for the management of her stressors. ? ?Participation Level: Active ? ?Diagnosis: Axis I.:  GAD ? ?  ?Follow Up Instructions: I discussed the assessment and treatment plan with the patient. The patient was provided an opportunity to ask questions and all were answered. The patient agreed with the plan and demonstrated an understanding of the instructions. ?  ?The patient was advised to call back or seek an in-person evaluation if the symptoms worsen or if the condition fails to improve as anticipated. ? ?I provided 60 minutes of non-face-to-face time during this encounter. ? ? ?Angeliz Settlemyre S, LCAS ? ? ? ? ? ? ? ? ? ? ? ?

## 2021-09-01 ENCOUNTER — Encounter: Payer: Self-pay | Admitting: Obstetrics and Gynecology

## 2021-09-01 ENCOUNTER — Ambulatory Visit (INDEPENDENT_AMBULATORY_CARE_PROVIDER_SITE_OTHER): Payer: No Typology Code available for payment source | Admitting: Obstetrics and Gynecology

## 2021-09-01 VITALS — BP 127/83 | HR 70 | Ht 66.0 in | Wt 196.0 lb

## 2021-09-01 DIAGNOSIS — Z3169 Encounter for other general counseling and advice on procreation: Secondary | ICD-10-CM | POA: Diagnosis not present

## 2021-09-01 NOTE — Progress Notes (Signed)
Patient presents as a New Patient to establish care. Patient states tat she would like to discuss trying conceive. She states that she and her husband just stopped using condoms a couple of weeks. Denies having any vaginal discharge, odor, or irritation. ? ?Last pap 06/09/19 Normal ? ?

## 2021-09-01 NOTE — Progress Notes (Signed)
? ? ?GYNECOLOGY  ENCOUNTER NOTE ? ?History:    ? Cindy Jones is a 31 y.o. [id number] female here for preconception counseling. She is married and her and her partner would like to start trying to conceive. She has a history of ADD, bulimia and tourette's. She was taking adderal and recently stopped with the assistance from her PCP/mental health provider. She continues to see a therapist for her history of bulimia.  ?  ?Gynecologic History ?Patient's last menstrual period was 08/09/2021. ?Contraception:  ?Last Pap: 2021. Result was normal with negative HPV ? ?Obstetric History ?OB History  ?Gravida Para Term Preterm AB Living  ?1 0 0 0 1 0  ?SAB IAB Ectopic Multiple Live Births  ?1 0 0 0    ?  ?# Outcome Date GA Lbr Len/2nd Weight Sex Delivery Anes PTL Lv  ?1 SAB 04/26/13 [redacted]w[redacted]d         ?   Birth Comments: [place]  ? ? ?Past Medical History:  ?Diagnosis Date  ? Anxiety   ? Asthma   ? as a child  ? Binge eating disorder   ? Dysrhythmia   ? " I feel it about once a month"  ? Frequent headaches   ? GAD (generalized anxiety disorder)   ? GERD (gastroesophageal reflux disease)   ? History of acute PID 06/09/2019  ? Major depression in complete remission (HCC)   ? Migraines   ? PID (acute pelvic inflammatory disease)   ? Seasonal allergies   ? Tourette disorder   ? Urinary tract bacterial infections   ? ? ?Past Surgical History:  ?Procedure Laterality Date  ? CHOLECYSTECTOMY    ? ESOPHAGOGASTRODUODENOSCOPY    ? LAPAROSCOPIC CHOLECYSTECTOMY SINGLE SITE WITH INTRAOPERATIVE CHOLANGIOGRAM N/A 03/15/2016  ? Procedure: LAPAROSCOPIC CHOLECYSTECTOMY SINGLE SITE WITH INTRAOPERATIVE CHOLANGIOGRAM;  Surgeon: Karie Soda, MD;  Location: [facility] OR;  Service: General;  Laterality: N/A;  ? TONSILLECTOMY AND ADENOIDECTOMY  2013  ? WISDOM TOOTH EXTRACTION    ? ? ?Current Outpatient Medications on File Prior to Visit  ?Medication Sig Dispense Refill  ? amphetamine-dextroamphetamine (ADDERALL XR) 5 MG 24 hr capsule Take 1 capsule (5 mg total)  by mouth daily. 30 capsule 0  ? cetirizine (ZYRTEC ALLERGY) 10 MG tablet Take 1 tablet (10 mg total) by mouth 2 (two) times daily. 60 tablet 3  ? clindamycin (CLEOCIN T) 1 % lotion Apply a small amount to skin once to twice daily as needed for flare 60 mL 5  ? Cyanocobalamin (VITAMIN B-12) 1000 MCG SUBL Place under the tongue.    ? Vitamin D, Ergocalciferol, (DRISDOL) 1.25 MG (50000 UNIT) CAPS capsule Take 1 capsule by mouth every 7 days. 12 capsule 0  ? albuterol (VENTOLIN HFA) 108 (90 Base) MCG/ACT inhaler Inhale 1-2 puffs into the lungs 15 minutes prior to exercise as directed. (Patient not taking: Reported on 09/01/2021) 18 g 5  ? ondansetron (ZOFRAN ODT) 4 MG disintegrating tablet Dissolve 1 tablet (4 mg total) by mouth every 8 (eight) hours as needed for nausea or vomiting. (Patient not taking: Reported on 08/18/2021) 20 tablet 5  ? SUMAtriptan (IMITREX) 20 MG/ACT nasal spray Use 1 spray in one nostril as directed.  May repeat once in 2 hours if headache persists or recurs. (Patient not taking: Reported on 08/18/2021) 6 each 5  ? ?No current facility-administered medications on file prior to visit.  ? ? ?Allergies  ?Allergen Reactions  ? Latex Swelling and Rash  ? Barley Grass Hives  ?  Cranberry Juice Powder Other (See Comments)  ?  Throat pain.  ? Naproxen Other (See Comments)  ?  "Liver starts hurting" ?Diarrhea  ? Nickel   ?  Skin peeling  ? Sulfa Antibiotics Hives and Swelling  ? ? ?Social History:  reports that she has never smoked. She has never used smokeless tobacco. She reports current alcohol use of about 1.0 standard drink per week. She reports that she does not use drugs. ? ?Family History  ?Problem Relation Age of Onset  ? Alcohol abuse Mother   ? Bipolar disorder Mother   ? Cancer Father   ?     sarcoma; passed away when pt was 8  ? Asthma Sister   ? Allergic rhinitis Sister   ? Eczema Sister   ? Emphysema Maternal Grandmother   ? Tourette syndrome Maternal Grandfather   ? Emphysema Paternal  Grandmother   ? Heart disease Paternal Grandfather   ? Tourette syndrome Maternal Aunt   ? Alcohol abuse Maternal Aunt   ? Alcohol abuse Maternal Uncle   ? Hyperlipidemia Paternal Aunt   ? Hashimoto's thyroiditis Paternal Aunt   ? Suicidality Neg Hx   ? Urticaria Neg Hx   ? Angioedema Neg Hx   ? ? ?The following portions of the patient's history were reviewed and updated as appropriate: allergies, current medications, past family history, past medical history, past social history, past surgical history and problem list. ? ?Review of Systems ?Pertinent items noted in HPI and remainder of comprehensive ROS otherwise negative. ? ?Physical Exam:  ?BP 127/83   Pulse 70   Ht 5\' 6"  (1.676 m)   Wt 196 lb (88.9 kg)   LMP 08/09/2021   BMI 31.64 kg/m?  ?CONSTITUTIONAL: Well-developed, well-nourished female in no acute distress.  ?HENT:  Normocephalic ?NECK: Normal range of motion ?NEUROLOGIC: Alert and oriented to person, place, and time.  ?PSYCHIATRIC: Normal mood and affect.  ?  ?Assessment and Plan:  ? ?1. Encounter for preconception consultation ? ?Prenatal vitamins daily- samples provided ?Add addition OTC folic acid 1 mg daily.  ?Risk vs benefit's discussed with taking adderall- she has stopped this recently.  ? ? ?Raianna Slight, 10/09/2021, NP ?Faculty Practice ?[facility], [facility]  ?

## 2021-09-05 ENCOUNTER — Ambulatory Visit (INDEPENDENT_AMBULATORY_CARE_PROVIDER_SITE_OTHER): Payer: No Typology Code available for payment source | Admitting: Licensed Clinical Social Worker

## 2021-09-05 DIAGNOSIS — F411 Generalized anxiety disorder: Secondary | ICD-10-CM | POA: Diagnosis not present

## 2021-09-08 ENCOUNTER — Encounter (HOSPITAL_COMMUNITY): Payer: Self-pay | Admitting: Licensed Clinical Social Worker

## 2021-09-08 NOTE — Progress Notes (Signed)
Virtual Visit via Video Note ? ?I connected with Cindy Jones on 09/05/21 at 1:00pm EST by a video enabled telemedicine application and verified that I am speaking with the correct person using two identifiers. ?  ?I discussed the limitations of evaluation and management by telemedicine and the availability of in person appointments. The patient expressed understanding and agreed to proceed. ? ?LOCATION: ?Patient: Home ?Provider: Home Office ? ?History of Present Illness: Pt was referred to therapy by her psychiatrist at [facility] for anxiety and panic attacks. ? ? ?Participation Level: Active ? ? ?Type of Therapy: Virtual Video individual therapy ? ? ?Treatment Goal Addressed: Pt will verbalize an understanding of how thoughts, physical feelings, and behavioral actions contribute to anxiety  ? ?Interventions: CBT/Supportive/Psychoeducation ? ?Summary: Patient presented for today?s session on time and was alert, oriented x5, with no evidence or self-report of SI/HI or A/V H. Clinician inquired about patient?s current emotional ratings, as well as any significant changes in thoughts, feelings or behavior since previous session. Patient's ratings 1/10 for depression, 3/10 for anxiety, 1/10 for anger/irritability, and denied any panic attacks. Cln and pt explored emotional ratings and coping skills. Pt provided an update on family, work, stressors, goals, medication decrease, pre-pregnancy planning. "I'm still taking 5mg  of Adderall." Cln and pt explored her management of her ADHD and Tourettes symptoms, without her medication. Cln and pt reviewed her journaling in her "therapy and anxiety notebooks," takeaways, and goals. Pt had her first pre-pregnancy dr appt and establish care. Cln and pt discussed her pregnancy journey.   ? ? ? ? ? ? ?Collaboration of Care: Other: None needed at this session. ? ?Patient/Guardian was advised Release of Information must be obtained prior to any record release in order to collaborate their  care with an outside provider. Patient/Guardian was advised if they have not already done so to contact the registration department to sign all necessary forms in order for to release information regarding their care.  ? ?Consent: Patient/Guardian gives verbal consent for treatment and assignment of benefits for services provided during this visit. Patient/Guardian expressed understanding and agreed to proceed.   ? ?  ?Assessment and plan: Counselor will continue to meet with patient to address treatment plan goals. Patient will continue to follow recommendations of providers and implement skills learned in session. ? ?Suicidal/Homicidal: Nowithout intent/plan ? ?Therapist Response: Assessed pt's current functioning and reviewed progress.. Assisted pt processing stressors, negative thoughts.  Assisted pt processing for the management of her stressors. ? ?Participation Level: Active ? ?Diagnosis: Axis I.:  GAD ? ?  ?Follow Up Instructions: I discussed the assessment and treatment plan with the patient. The patient was provided an opportunity to ask questions and all were answered. The patient agreed with the plan and demonstrated an understanding of the instructions. ?  ?The patient was advised to call back or seek an in-person evaluation if the symptoms worsen or if the condition fails to improve as anticipated. ? ?I provided 60 minutes of non-face-to-face time during this encounter. ? ? ?Gevin Perea S, LCAS ? ? ? ? ? ? ? ? ? ? ? ?

## 2021-09-12 ENCOUNTER — Ambulatory Visit (INDEPENDENT_AMBULATORY_CARE_PROVIDER_SITE_OTHER): Payer: No Typology Code available for payment source | Admitting: *Deleted

## 2021-09-12 ENCOUNTER — Other Ambulatory Visit (HOSPITAL_COMMUNITY): Payer: Self-pay

## 2021-09-12 ENCOUNTER — Encounter (HOSPITAL_COMMUNITY): Payer: Self-pay | Admitting: Licensed Clinical Social Worker

## 2021-09-12 ENCOUNTER — Ambulatory Visit (INDEPENDENT_AMBULATORY_CARE_PROVIDER_SITE_OTHER): Payer: No Typology Code available for payment source | Admitting: Licensed Clinical Social Worker

## 2021-09-12 DIAGNOSIS — O219 Vomiting of pregnancy, unspecified: Secondary | ICD-10-CM

## 2021-09-12 DIAGNOSIS — Z3201 Encounter for pregnancy test, result positive: Secondary | ICD-10-CM | POA: Diagnosis not present

## 2021-09-12 DIAGNOSIS — Z3200 Encounter for pregnancy test, result unknown: Secondary | ICD-10-CM

## 2021-09-12 DIAGNOSIS — F411 Generalized anxiety disorder: Secondary | ICD-10-CM

## 2021-09-12 LAB — POCT PREGNANCY, URINE: Preg Test, Ur: POSITIVE — AB

## 2021-09-12 MED ORDER — PROMETHAZINE HCL 25 MG PO TABS
25.0000 mg | ORAL_TABLET | Freq: Four times a day (QID) | ORAL | 0 refills | Status: DC | PRN
  Filled 2021-09-12: qty 30, 7d supply, fill #0

## 2021-09-12 NOTE — Progress Notes (Signed)
Patient dropped off urine for pregnancy test which was positive. I called her and she confirms LMP 08/09/21 which makes her [redacted]w[redacted]d with EDD 05/16/22. I advised start prenatal care with provider her choice and [facility]. She is already a patient at [facility] but would like to go here for prenatal care. I reviewed prenatal care choices and she chose [facility]. Message sent to [facility] to schedule new ob visit with [facility]. Reviewed allergies and meds.  ?Staci Acosta ?

## 2021-09-12 NOTE — Patient Instructions (Signed)
Prenatal Care Providers           [facility]  [address] [phone]  [facility]   [address]  [phone]  [facility] [place]       [address] [phone]            [facility] [place]     [address] [phone]          [facility] [place]   [address] [phone]  [facility]  [address] [phone]     [facility]  [address]   [phone]     [facility]  Phone: [phone]  [facility]  Phone: [phone]  [facility] Phone: [phone]  [facility] Phone: [phone]  [facility] Phone: [phone]  [facility] Phone: [phone]  [facility]  Phone: [phone]  

## 2021-09-12 NOTE — Progress Notes (Signed)
Patient was evaluated by nursing staff. Agree with assessment and plan.  °

## 2021-09-12 NOTE — Progress Notes (Signed)
Virtual Visit via Video Note ? ?I connected with Cindy Jones on 09/12/21 at 1:00pm EST by a video enabled telemedicine application and verified that I am speaking with the correct person using two identifiers. ?  ?I discussed the limitations of evaluation and management by telemedicine and the availability of in person appointments. The patient expressed understanding and agreed to proceed. ? ?LOCATION: ?Patient: Home ?Provider: Home Office ? ?History of Present Illness: Pt was referred to therapy by her psychiatrist at [facility] for anxiety and panic attacks. ? ? ?Participation Level: Active ? ? ?Type of Therapy: Virtual Video individual therapy ? ? ?Treatment Goal Addressed: Pt will verbalize an understanding of how thoughts, physical feelings, and behavioral actions contribute to anxiety AEB self report ? ?Interventions: CBT/Supportive/Psychoeducation ? ?Summary: Patient presented for today?s session on time and was alert, oriented x5, with no evidence or self-report of SI/HI or A/V H. Clinician inquired about patient?s current emotional ratings, as well as any significant changes in thoughts, feelings or behavior since previous session. Patient's ratings 1/10 for depression, 3/10 for anxiety, 1/10 for anger/irritability, and denied any panic attacks. Cln and pt explored emotional ratings and coping skills. Pt provided an update on family, work, stressors, goals, medication decrease, pre-pregnancy planning. "I'm pregnant! I can't believe it happened so fast!l." Cln and pt explored her pregnancy journey without affecting her anxiety along the way: fears, concerns, knowledge, plans, coping skills, work. Clinician utilized CBT to process thoughts, feelings, and behaviors.  ? ? ?PLAN: therapy and anxiety notebooks, takeaways, goals ? ? ?Collaboration of Care: Other: Call OBGYN to make appointment. ? ?Patient/Guardian was advised Release of Information must be obtained prior to any record release in order to collaborate  their care with an outside provider. Patient/Guardian was advised if they have not already done so to contact the registration department to sign all necessary forms in order for [place] to release information regarding their care.  ? ?Consent: Patient/Guardian gives verbal consent for treatment and assignment of benefits for services provided during this visit. Patient/Guardian expressed understanding and agreed to proceed.   ? ?  ?Assessment and plan: Counselor will continue to meet with patient to address treatment plan goals. Patient will continue to follow recommendations of providers and implement skills learned in session. ? ?Suicidal/Homicidal: Nowithout intent/plan ? ?Therapist Response: Assessed pt's current functioning and reviewed progress.. Assisted pt processing stressors, negative thoughts.  Assisted pt processing for the management of her stressors. ? ?Participation Level: Active ? ?Diagnosis: Axis I.:  GAD ? ?  ?Follow Up Instructions: I discussed the assessment and treatment plan with the patient. The patient was provided an opportunity to ask questions and all were answered. The patient agreed with the plan and demonstrated an understanding of the instructions. ?  ?The patient was advised to call back or seek an in-person evaluation if the symptoms worsen or if the condition fails to improve as anticipated. ? ?I provided 60 minutes of non-face-to-face time during this encounter. ? ? ?Diyan Dave S, LCAS ? ? ? ? ? ? ? ? ? ? ? ?

## 2021-09-14 ENCOUNTER — Other Ambulatory Visit (HOSPITAL_COMMUNITY): Payer: Self-pay

## 2021-09-19 ENCOUNTER — Ambulatory Visit (INDEPENDENT_AMBULATORY_CARE_PROVIDER_SITE_OTHER): Payer: No Typology Code available for payment source | Admitting: Licensed Clinical Social Worker

## 2021-09-19 ENCOUNTER — Encounter: Payer: Self-pay | Admitting: *Deleted

## 2021-09-19 ENCOUNTER — Other Ambulatory Visit: Payer: Self-pay

## 2021-09-19 ENCOUNTER — Encounter (HOSPITAL_COMMUNITY): Payer: Self-pay | Admitting: Licensed Clinical Social Worker

## 2021-09-19 DIAGNOSIS — F411 Generalized anxiety disorder: Secondary | ICD-10-CM

## 2021-09-19 DIAGNOSIS — O3680X0 Pregnancy with inconclusive fetal viability, not applicable or unspecified: Secondary | ICD-10-CM

## 2021-09-19 NOTE — Progress Notes (Signed)
Virtual Visit via Video Note  I connected with Cindy Jones on 09/19/21 at 1:00pm EST by a video enabled telemedicine application and verified that I am speaking with the correct person using two identifiers.   I discussed the limitations of evaluation and management by telemedicine and the availability of in person appointments. The patient expressed understanding and agreed to proceed.  LOCATION: Patient: Home Provider: Home Office  History of Present Illness: Pt was referred to therapy by her psychiatrist at [facility] for anxiety and panic attacks.   Participation Level: Active   Type of Therapy: Virtual Video individual therapy   Treatment Goal Addressed: Pt will verbalize an understanding of how thoughts, physical feelings, and behavioral actions contribute to anxiety AEB self report  Interventions: CBT/Supportive/Psychoeducation  Summary: Patient presented for today's session on time and was alert, oriented x5, with no evidence or self-report of SI/HI or A/V H. Clinician inquired about patient's current emotional ratings, as well as any significant changes in thoughts, feelings or behavior since previous session. Patient's ratings 1/10 for depression, 5/10 for anxiety, 1/10 for anger/irritability, and denied any panic attacks. Cln and pt explored emotional ratings and coping skills. Pt provided an update on family, work, stressors, goals, medication decrease, pregnancy planning. Cln reports on her conversations with medical providers, trying to find an OBGYN with little to no support. Cln and pt explored all the ways she trying to control situations she has no control over, describing to pt what she has control over. Cln and pt explored her pregnancy journey without affecting her anxiety along the way: fears, concerns, knowledge, plans, coping skills, work. Clinician utilized CBT to process thoughts, feelings, and behaviors. Cln taught pt "Soothing with 5 Senses in [place]" and 4-7-8  breathing exercise to assist with coping skills.   PLAN: therapy and anxiety notebooks, takeaways, goals   Collaboration of Care: Other: Call OBGYN to make appointment.  Patient/Guardian was advised Release of Information must be obtained prior to any record release in order to collaborate their care with an outside provider. Patient/Guardian was advised if they have not already done so to contact the registration department to sign all necessary forms in order for [place] to release information regarding their care.   Consent: Patient/Guardian gives verbal consent for treatment and assignment of benefits for services provided during this visit. Patient/Guardian expressed understanding and agreed to proceed.      Assessment and plan: Counselor will continue to meet with patient to address treatment plan goals. Patient will continue to follow recommendations of providers and implement skills learned in session.  Suicidal/Homicidal: Nowithout intent/plan  Therapist Response: Assessed pt's current functioning and reviewed progress.. Assisted pt processing stressors, negative thoughts.  Assisted pt processing for the management of her stressors.  Participation Level: Active  Diagnosis: Axis I.:  GAD    Follow Up Instructions: I discussed the assessment and treatment plan with the patient. The patient was provided an opportunity to ask questions and all were answered. The patient agreed with the plan and demonstrated an understanding of the instructions.   The patient was advised to call back or seek an in-person evaluation if the symptoms worsen or if the condition fails to improve as anticipated.  I provided 60 minutes of non-face-to-face time during this encounter.   Yusra Ravert S, LCAS

## 2021-09-20 ENCOUNTER — Ambulatory Visit (INDEPENDENT_AMBULATORY_CARE_PROVIDER_SITE_OTHER): Payer: No Typology Code available for payment source

## 2021-09-20 ENCOUNTER — Other Ambulatory Visit: Payer: Self-pay | Admitting: Family Medicine

## 2021-09-20 DIAGNOSIS — O3680X0 Pregnancy with inconclusive fetal viability, not applicable or unspecified: Secondary | ICD-10-CM

## 2021-09-28 ENCOUNTER — Encounter (HOSPITAL_COMMUNITY): Payer: Self-pay | Admitting: Licensed Clinical Social Worker

## 2021-09-28 ENCOUNTER — Ambulatory Visit (INDEPENDENT_AMBULATORY_CARE_PROVIDER_SITE_OTHER): Payer: No Typology Code available for payment source | Admitting: Licensed Clinical Social Worker

## 2021-09-28 DIAGNOSIS — F411 Generalized anxiety disorder: Secondary | ICD-10-CM

## 2021-09-28 NOTE — Progress Notes (Signed)
Virtual Visit via Video Note  I connected with Cindy Jones on 09/28/21 at 1:00pm EST by a video enabled telemedicine application and verified that I am speaking with the correct person using two identifiers.   I discussed the limitations of evaluation and management by telemedicine and the availability of in person appointments. The patient expressed understanding and agreed to proceed.  LOCATION: Patient: Home Provider: Home Office  History of Present Illness: Pt was referred to therapy by her psychiatrist at [facility] for anxiety and panic attacks.   Participation Level: Active   Type of Therapy: Virtual Video individual therapy   Treatment Goal Addressed: Pt will verbalize an understanding of how thoughts, physical feelings, and behavioral actions contribute to anxiety AEB self report  Interventions: CBT/Supportive/Psychoeducation  Summary: Patient presented for today's session on time and was alert, oriented x5, with no evidence or self-report of SI/HI or A/V H. Clinician inquired about patient's current emotional ratings, as well as any significant changes in thoughts, feelings or behavior since previous session. Patient's ratings 1/10 for depression, 5/10 for anxiety, 1/10 for anger/irritability, and denied any panic attacks. Cln and pt explored emotional ratings and coping skills. Cln used CBT to assist Pt processing an update on family, work, stressors, goals, medication decrease, pregnancy planning. Cln went to the OB who was able to calm the pt's fears about her baby.  Cln continues to remind pt about trying to control what she has no control over. Cln and pt continue to explore her pregnancy journey, working on her anxiety along the way: fears, concerns, knowledge, plans, coping skills, work. Cln reminded pt to continue using "Soothing with 5 Senses in [place]" and 4-7-8 breathing exercise for coping skills.   PLAN: therapy and anxiety notebooks, takeaways,  goals   Collaboration of Care: Other: Call OBGYN to make appointment.  Patient/Guardian was advised Release of Information must be obtained prior to any record release in order to collaborate their care with an outside provider. Patient/Guardian was advised if they have not already done so to contact the registration department to sign all necessary forms in order for [place] to release information regarding their care.   Consent: Patient/Guardian gives verbal consent for treatment and assignment of benefits for services provided during this visit. Patient/Guardian expressed understanding and agreed to proceed.      Assessment and plan: Counselor will continue to meet with patient to address treatment plan goals. Patient will continue to follow recommendations of providers and implement skills learned in session.  Suicidal/Homicidal: Nowithout intent/plan  Therapist Response: Assessed pt's current functioning and reviewed progress.. Assisted pt processing stressors, negative thoughts.  Assisted pt processing for the management of her stressors.  Participation Level: Active  Diagnosis: Axis I.:  GAD    Follow Up Instructions: I discussed the assessment and treatment plan with the patient. The patient was provided an opportunity to ask questions and all were answered. The patient agreed with the plan and demonstrated an understanding of the instructions.   The patient was advised to call back or seek an in-person evaluation if the symptoms worsen or if the condition fails to improve as anticipated.  I provided 60 minutes of non-face-to-face time during this encounter.   Meliyah Simon S, LCAS

## 2021-10-03 ENCOUNTER — Ambulatory Visit (HOSPITAL_COMMUNITY): Payer: No Typology Code available for payment source | Admitting: Licensed Clinical Social Worker

## 2021-10-03 ENCOUNTER — Encounter (HOSPITAL_COMMUNITY): Payer: Self-pay

## 2021-10-05 ENCOUNTER — Encounter (HOSPITAL_COMMUNITY): Payer: Self-pay | Admitting: Licensed Clinical Social Worker

## 2021-10-05 ENCOUNTER — Ambulatory Visit (INDEPENDENT_AMBULATORY_CARE_PROVIDER_SITE_OTHER): Payer: No Typology Code available for payment source | Admitting: Licensed Clinical Social Worker

## 2021-10-05 DIAGNOSIS — F411 Generalized anxiety disorder: Secondary | ICD-10-CM | POA: Diagnosis not present

## 2021-10-05 NOTE — Progress Notes (Signed)
Virtual Visit via Video Note  I connected with Cindy Jones on 10/05/21 at 1:00pm EST by a video enabled telemedicine application and verified that I am speaking with the correct person using two identifiers.   I discussed the limitations of evaluation and management by telemedicine and the availability of in person appointments. The patient expressed understanding and agreed to proceed.  LOCATION: Patient: Home Provider: Home Office  History of Present Illness: Pt was referred to therapy by her psychiatrist at [facility] for anxiety and panic attacks.   Participation Level: Active   Type of Therapy: Virtual Video individual therapy   Treatment Goal Addressed: Pt will verbalize an understanding of how thoughts, physical feelings, and behavioral actions contribute to anxiety AEB self report  Interventions: CBT/Supportive/Psychoeducation  Summary: Patient presented for today's session sick with a cold/sinus and morning sickness on time and was alert, oriented x5, with no evidence or self-report of SI/HI or A/V H. Clinician inquired about patient's current emotional ratings, as well as any significant changes in thoughts, feelings or behavior since previous session. Patient's ratings 1/10 for depression, 5/10 for anxiety, 1/10 for anger/irritability, and denied any panic attacks since last session. Cln and pt explored emotional ratings and coping skills. Cln used CBT to assist Pt processing an update on family, work, stressors, health, pregnancy journey. "I'm struggling with not being 100% at work." Cln suggested she give herself permission to not be 100% at work currently due to morning sickness, throwing up, being tired. "It's not forever." Also, suggested pt talk to her supervisor about being pregnant. Pt was in agreement. Cln reminded patient of her coping skills for anxiety.     PLAN: therapy and anxiety notebooks, takeaways, goals   Collaboration of Care: Other: None at this  session.  Patient/Guardian was advised Release of Information must be obtained prior to any record release in order to collaborate their care with an outside provider. Patient/Guardian was advised if they have not already done so to contact the registration department to sign all necessary forms in order for [place] to release information regarding their care.   Consent: Patient/Guardian gives verbal consent for treatment and assignment of benefits for services provided during this visit. Patient/Guardian expressed understanding and agreed to proceed.      Assessment and plan: Counselor will continue to meet with patient to address treatment plan goals. Patient will continue to follow recommendations of providers and implement skills learned in session.  Suicidal/Homicidal: Nowithout intent/plan  Therapist Response: Assessed pt's current functioning and reviewed progress.. Assisted pt processing stressors, negative thoughts.  Assisted pt processing for the management of her stressors.  Participation Level: Active  Diagnosis: Axis I.:  GAD    Follow Up Instructions: I discussed the assessment and treatment plan with the patient. The patient was provided an opportunity to ask questions and all were answered. The patient agreed with the plan and demonstrated an understanding of the instructions.   The patient was advised to call back or seek an in-person evaluation if the symptoms worsen or if the condition fails to improve as anticipated.  I provided 30 minutes of non-face-to-face time during this encounter.   Cindy Jones S, LCAS

## 2021-10-06 ENCOUNTER — Telehealth (HOSPITAL_BASED_OUTPATIENT_CLINIC_OR_DEPARTMENT_OTHER): Payer: No Typology Code available for payment source | Admitting: Psychiatry

## 2021-10-06 DIAGNOSIS — F410 Panic disorder [episodic paroxysmal anxiety] without agoraphobia: Secondary | ICD-10-CM

## 2021-10-06 DIAGNOSIS — F952 Tourette's disorder: Secondary | ICD-10-CM | POA: Diagnosis not present

## 2021-10-06 DIAGNOSIS — F411 Generalized anxiety disorder: Secondary | ICD-10-CM

## 2021-10-06 DIAGNOSIS — F988 Other specified behavioral and emotional disorders with onset usually occurring in childhood and adolescence: Secondary | ICD-10-CM | POA: Diagnosis not present

## 2021-10-06 NOTE — Progress Notes (Signed)
Virtual Visit via Video Note  I connected with Cindy Jones on 10/06/21 at 10:30 AM EDT by a video enabled telemedicine application and verified that I am speaking with the correct person using two identifiers.  Location: Patient: home Provider: office   I discussed the limitations of evaluation and management by telemedicine and the availability of in person appointments. The patient expressed understanding and agreed to proceed.  History of Present Illness: Cindy Jones is 2 months pregnant. It came as a surprise at how quickly she became pregnant, as they only just started trying. Cindy Jones had been taking Adderall 1-2x/week before she got the pregnancy test. She stopped it completely. She found out she was pregnant when she was  around 3-4 week. Her concentration is very poor lately.  She is not looking at the scale and is not concerned about weight gain. She is eating well and taking prenatal vitamins. Her anxiety is more manageable now that she has heard the baby's heartbeat. Cindy Jones denies any panic attacks in the last several weeks. Her energy is limited and she has not been able to exercise. She has been napping more. She is frustrated with herself because she wants to do more. Cindy Jones denies depression and anhedonia. She denies SI/HI. Her tourette's is more frequent in the chest. She feels her tics switch off in intensity and they occur everyday. Cindy Jones does not want to take any stimulant during her pregnancy. She is using techniques to help improve her concentration and productivity without meds. Her work has been affected by not taking stimulants.     Observations/Objective: Psychiatric Specialty Exam: ROS  Last menstrual period 08/09/2021.There is no height or weight on file to calculate BMI.  General Appearance: Fairly Groomed and Neat  Eye Contact:  Good  Speech:  Clear and Coherent and Normal Rate  Volume:  Normal  Mood:  Euthymic  Affect:  Full Range  Thought Process:  Goal Directed,  Linear, and Descriptions of Associations: Intact  Orientation:  Full (Time, Place, and Person)  Thought Content:  Logical  Suicidal Thoughts:  No  Homicidal Thoughts:  No  Memory:  Immediate;   Good  Judgement:  Good  Insight:  Good  Psychomotor Activity:  Normal  Concentration:  Concentration: Good  Recall:  Good  Fund of Knowledge:  Good  Language:  Good  Akathisia:  No  Handed:  Right  AIMS (if indicated):     Assets:  Communication Skills Desire for Improvement Financial Resources/Insurance Housing Intimacy Leisure Time Resilience Social Support Talents/Skills Transportation Vocational/Educational  ADL's:  Intact  Cognition:  WNL  Sleep:        Assessment and Plan:     10/06/2021   10:49 AM 09/01/2021   10:23 AM 08/18/2021   10:14 AM 06/16/2021   11:43 AM 04/07/2021   11:37 AM  Depression screen PHQ 2/9  Decreased Interest 0 0 0 0 0  Down, Depressed, Hopeless 0 0 0 0 0  PHQ - 2 Score 0 0 0 0 0  Altered sleeping  0     Tired, decreased energy  1     Change in appetite  0     Feeling bad or failure about yourself   0     Trouble concentrating  3     Moving slowly or fidgety/restless  3     Suicidal thoughts  0     PHQ-9 Score  7       Flowsheet Row [facility] Video Visit from 08[facility]18/2021 in [facility] Video Visit from 06[facility]16/2021 in [facility]  C-SSRS RISK CATEGORY No Risk No Risk No Risk        Cindy Jones is currently pregnant and is due May 16, 2022. We talked about the risks of using stimulants while pregnant. At this point in time she wants to wait and monitor.   Status of current problems: worsening ADHD symptoms  Meds:  D/c Adderall  1. ADD (attention deficit disorder) without hyperactivity  2. Tourette disease  3. GAD (generalized anxiety disorder)  4. Panic attacks     Labs: none     Therapy: brief supportive therapy provided.   Collaboration of Care: Other none  Patient/Guardian was advised Release of Information must be obtained prior to any record release in order to collaborate their care with an outside provider. Patient/Guardian was advised if they have not already done so to contact the registration department to sign all necessary forms in order for [place] to release information regarding their care.   Consent: Patient/Guardian gives verbal consent for treatment and assignment of benefits for services provided during this visit. Patient/Guardian expressed understanding and agreed to proceed.   Follow Up Instructions: Follow up in 2-3 months or sooner if needed    I discussed the assessment and treatment plan with the patient. The patient was provided an opportunity to ask questions and all were answered. The patient agreed with the plan and demonstrated an understanding of the instructions.   The patient was advised to call back or seek an in-person evaluation if the symptoms worsen or if the condition fails to improve as anticipated.  I provided 25 minutes of non-face-to-face time during this encounter.   Oletta Darter, MD

## 2021-10-10 ENCOUNTER — Ambulatory Visit (HOSPITAL_COMMUNITY): Payer: No Typology Code available for payment source | Admitting: Licensed Clinical Social Worker

## 2021-10-11 ENCOUNTER — Telehealth (INDEPENDENT_AMBULATORY_CARE_PROVIDER_SITE_OTHER): Payer: No Typology Code available for payment source

## 2021-10-11 DIAGNOSIS — Z3480 Encounter for supervision of other normal pregnancy, unspecified trimester: Secondary | ICD-10-CM

## 2021-10-11 DIAGNOSIS — Z3A00 Weeks of gestation of pregnancy not specified: Secondary | ICD-10-CM

## 2021-10-11 NOTE — Progress Notes (Signed)
New OB Intake  I connected with  Cindy Jones on 10/11/21 at  8:15 AM EDT by MyChart Video Visit and verified that I am speaking with the correct person using two identifiers. Nurse is located at [facility] and pt is located at [facility].  I discussed the limitations, risks, security and privacy concerns of performing an evaluation and management service by telephone and the availability of in person appointments. I also discussed with the patient that there may be a patient responsible charge related to this service. The patient expressed understanding and agreed to proceed.  I explained I am completing New OB Intake today. We discussed her EDD of 05/16/22 that is based on LMP of 08/09/21. Pt is G2/P0. I reviewed her allergies, medications, Medical/Surgical/OB history, and appropriate screenings. I informed her of [facility] services. Based on history, this is a/an  pregnancy uncomplicated .   Patient Active Problem List   Diagnosis Date Noted   Encounter for preconception consultation 09/01/2021   History of asthma 09/23/2020   Urticaria 08/24/2020   Other adverse food reactions, not elsewhere classified, subsequent encounter 08/24/2020   Other allergic rhinitis 08/24/2020   B12 deficiency 07/01/2020   Exercise-induced asthma 10/27/2019   Overweight (BMI 25.0-29.9) 06/03/2018   Bulimia 04/20/2015   ADD (attention deficit disorder) 01/15/2014   Tourette disease 10/18/2013    Concerns addressed today  Delivery Plans:  Plans to deliver at [facility].   MyChart/Babyscripts MyChart access verified. I explained pt will have some visits in office and some virtually. Babyscripts instructions given and order placed. Patient verifies receipt of registration text/e-mail. Account successfully created and app downloaded.  Blood Pressure Cuff  Pt has own BP Cuff, Explained after first prenatal appt pt will check weekly and document in Babyscripts.  Weight scale: Patient does / does not  have weight scale.  Weight scale ordered for patient to pick up from [employer].   Anatomy US Explained first scheduled [place] will be around 19 weeks. Anatomy US scheduled for 12/20/21 at 0900. Pt notified to arrive at [place]. Scheduled AFP lab only appointment if CenteringPregnancy pt for same day as anatomy US.   Labs Discussed Johnsie Cancel genetic screening with patient. Would like both Panorama and Horizon drawn at new OB visit.Also if interested in genetic testing, tell patient she will need AFP 15-21 weeks to complete genetic testing .Routine prenatal labs needed.  Covid Vaccine Patient has covid vaccine.   Is patient a CenteringPregnancy candidate?  Pt was already showing as a MOM/BABY Pt. Declined due to NA Not a candidate due to NA Centering Patient" indicated on sticky note   Is patient a Mom+Baby Combined Care candidate?  Pt already shwng as a Mom/Baby Pt     Scheduled with Mom+Baby provider    Is patient interested in Waterbirth?  No   "Interested in [employer] - Schedule next visit with CNM" on sticky note  Informed patient of [facility] website  and placed link in her AVS.   Social Determinants of Health Food Insecurity: Patient denies food insecurity. WIC Referral: Patient is interested in referral to [facility].  Transportation: Patient denies transportation needs. Childcare: Discussed no children allowed at ultrasound appointments. Offered childcare services; patient declines childcare services at this time.  Send link to Pregnancy Navigators   Placed OB Box on problem list and updated  First visit review I reviewed new OB appt with pt. I explained she will have a pelvic exam, ob bloodwork with genetic screening, and PAP  smear. Explained pt will be seen by Dr. Dione Plover at first visit; encounter routed to appropriate provider. Explained that patient will be seen by pregnancy navigator following visit with provider. [facility] information placed in AVS.   Bethanne Ginger, [place] 10/11/2021  8:32 AM

## 2021-10-11 NOTE — Patient Instructions (Addendum)

## 2021-10-12 ENCOUNTER — Ambulatory Visit (INDEPENDENT_AMBULATORY_CARE_PROVIDER_SITE_OTHER): Payer: No Typology Code available for payment source | Admitting: Licensed Clinical Social Worker

## 2021-10-12 DIAGNOSIS — F411 Generalized anxiety disorder: Secondary | ICD-10-CM

## 2021-10-14 ENCOUNTER — Encounter: Payer: Self-pay | Admitting: Family Medicine

## 2021-10-15 ENCOUNTER — Inpatient Hospital Stay (HOSPITAL_COMMUNITY)
Admission: AD | Admit: 2021-10-15 | Discharge: 2021-10-15 | Disposition: A | Discharge status: Home / Self Care | Payer: No Typology Code available for payment source | Attending: Obstetrics & Gynecology | Admitting: Obstetrics & Gynecology

## 2021-10-15 ENCOUNTER — Other Ambulatory Visit: Payer: Self-pay

## 2021-10-15 ENCOUNTER — Encounter (HOSPITAL_COMMUNITY): Payer: Self-pay | Admitting: Obstetrics & Gynecology

## 2021-10-15 DIAGNOSIS — G90A Postural orthostatic tachycardia syndrome (POTS): Secondary | ICD-10-CM | POA: Diagnosis present

## 2021-10-15 DIAGNOSIS — O99351 Diseases of the nervous system complicating pregnancy, first trimester: Secondary | ICD-10-CM | POA: Insufficient documentation

## 2021-10-15 DIAGNOSIS — Z3A09 9 weeks gestation of pregnancy: Secondary | ICD-10-CM

## 2021-10-15 DIAGNOSIS — R42 Dizziness and giddiness: Secondary | ICD-10-CM | POA: Diagnosis not present

## 2021-10-15 DIAGNOSIS — Z3480 Encounter for supervision of other normal pregnancy, unspecified trimester: Secondary | ICD-10-CM

## 2021-10-15 LAB — COMPREHENSIVE METABOLIC PANEL
ALT: 15 U/L (ref 0–44)
AST: 18 U/L (ref 15–41)
Albumin: 3.7 g/dL (ref 3.5–5.0)
Alkaline Phosphatase: 30 U/L — ABNORMAL LOW (ref 38–126)
Anion gap: 9 (ref 5–15)
BUN: 7 mg/dL (ref 6–20)
CO2: 20 mmol/L — ABNORMAL LOW (ref 22–32)
Calcium: 8.5 mg/dL — ABNORMAL LOW (ref 8.9–10.3)
Chloride: 109 mmol/L (ref 98–111)
Creatinine, Ser: 0.57 mg/dL (ref 0.44–1.00)
GFR, Estimated: >60 mL/min (ref >60)
Glucose, Bld: 74 mg/dL (ref 70–99)
Potassium: 4.2 mmol/L (ref 3.5–5.1)
Sodium: 138 mmol/L (ref 135–145)
Total Bilirubin: 0.6 mg/dL (ref 0.3–1.2)
Total Protein: 6.2 g/dL — ABNORMAL LOW (ref 6.5–8.1)

## 2021-10-15 LAB — CBC
HCT: 34.9 % — ABNORMAL LOW (ref 36.0–46.0)
Hemoglobin: 11.9 g/dL — ABNORMAL LOW (ref 12.0–15.0)
MCH: 31.4 pg (ref 26.0–34.0)
MCHC: 34.1 g/dL (ref 30.0–36.0)
MCV: 92.1 fL (ref 80.0–100.0)
Platelets: 173 10*3/uL (ref 150–400)
RBC: 3.79 MIL/uL — ABNORMAL LOW (ref 3.87–5.11)
RDW: 11.9 % (ref 11.5–15.5)
WBC: 8 10*3/uL (ref 4.0–10.5)
nRBC: 0 % (ref 0.0–0.2)

## 2021-10-15 LAB — URINALYSIS, ROUTINE W REFLEX MICROSCOPIC
Bilirubin Urine: NEGATIVE
Glucose, UA: NEGATIVE mg/dL
Hgb urine dipstick: NEGATIVE
Ketones, ur: NEGATIVE mg/dL
Leukocytes,Ua: NEGATIVE
Nitrite: NEGATIVE
Protein, ur: NEGATIVE mg/dL
Specific Gravity, Urine: 1.004 — ABNORMAL LOW (ref 1.005–1.030)
pH: 8 (ref 5.0–8.0)

## 2021-10-15 MED ORDER — DOXYLAMINE-PYRIDOXINE 10-10 MG PO TBEC: DELAYED_RELEASE_TABLET | ORAL | 0 refills | Status: DC

## 2021-10-15 MED ORDER — SCOPOLAMINE 1 MG/3DAYS TD PT72
1.0000 | MEDICATED_PATCH | Freq: Once | TRANSDERMAL | Status: DC
  Administered 2021-10-15: 1.5 mg via TRANSDERMAL
  Filled 2021-10-15: qty 1

## 2021-10-15 MED ORDER — SODIUM CHLORIDE 0.9 % IV BOLUS
2000.0000 mL | Freq: Once | INTRAVENOUS | Status: AC
  Administered 2021-10-15: 1000 mL via INTRAVENOUS

## 2021-10-15 MED ORDER — SCOPOLAMINE 1 MG/3DAYS TD PT72
1.0000 | MEDICATED_PATCH | Freq: Once | TRANSDERMAL | 0 refills | Status: AC
  Filled 2021-10-15: qty 1, 1d supply, fill #0

## 2021-10-15 NOTE — MAU Note (Signed)
Cindy Jones is a 31 y.o. at [redacted]w[redacted]d here in MAU reporting: dizziness that began last night.  States woke up this morning and was barely unable to stand. Denies VB.    Onset of complaint: last night Pain score: 0 Vitals:   10/15/21 1146  BP: 112/66  Pulse: 82  Resp: 20  Temp: 98.8 F (37.1 C)  SpO2: 100%     FHT:N/A Lab orders placed from triage:   None

## 2021-10-15 NOTE — MAU Provider Note (Signed)
Pt informed that the ultrasound is considered a limited OB ultrasound and is not intended to be a complete ultrasound exam.  Patient also informed that the ultrasound is not being completed with the intent of assessing for fetal or placental anomalies or any pelvic abnormalities.  Explained that the purpose of today's ultrasound is to assess for  viability (FHR 169).  Patient acknowledges the purpose of the exam and the limitations of the study.    Marylen Ponto, NP  4:42 PM 10/15/2021

## 2021-10-15 NOTE — MAU Provider Note (Signed)
History     CSN: [id number]  Arrival date and time: 10/15/21 1109   Event Date/Time   First Provider Initiated Contact with Patient 10/15/21 1229      Chief Complaint  Patient presents with   Dizziness   Cindy Jones , a  31 y.o. G2P0010 at [redacted]w[redacted]d presents to MAU with new onset of nausea and dizziness that started last night. Patient states that she rolled over and immediately felt dizzy and nauseous. This morning attempted to get out of bed and almost fell down, and vision was blurred. She states she laid back down and it "eased up some but never went away." She states she attempted to get up again and it became worse. Worsened by the car ride to MAU. Has a history of POTS. States she felt like this once before after a surgery, but nothing sense. She denies headache, and vomiting. No other complaints. FOB at bedside contributing to HPI.      OB History     Gravida  2   Para  0   Term  0   Preterm  0   AB  1   Living  0      SAB  1   IAB  0   Ectopic  0   Multiple  0   Live Births              Past Medical History:  Diagnosis Date   Anxiety    Asthma    as a child   Binge eating disorder    Dysrhythmia    " I feel it about once a month"   Frequent headaches    GAD (generalized anxiety disorder)    GERD (gastroesophageal reflux disease)    History of acute PID 06/09/2019   Major depression in complete remission (HCC)    Migraines    PID (acute pelvic inflammatory disease)    Seasonal allergies    Tourette disorder    Urinary tract bacterial infections     Past Surgical History:  Procedure Laterality Date   CHOLECYSTECTOMY     ESOPHAGOGASTRODUODENOSCOPY     LAPAROSCOPIC CHOLECYSTECTOMY SINGLE SITE WITH INTRAOPERATIVE CHOLANGIOGRAM N/A 03/15/2016   Procedure: LAPAROSCOPIC CHOLECYSTECTOMY SINGLE SITE WITH INTRAOPERATIVE CHOLANGIOGRAM;  Surgeon: Karie Soda, MD;  Location: [facility] OR;  Service: General;  Laterality: N/A;   TONSILLECTOMY AND  ADENOIDECTOMY  2013   WISDOM TOOTH EXTRACTION      Family History  Problem Relation Age of Onset   Alcohol abuse Mother    Bipolar disorder Mother    Cancer Father        sarcoma; passed away when pt was 8   Asthma Sister    Allergic rhinitis Sister    Eczema Sister    Emphysema Maternal Grandmother    Tourette syndrome Maternal Grandfather    Emphysema Paternal Grandmother    Heart disease Paternal Grandfather    Tourette syndrome Maternal Aunt    Alcohol abuse Maternal Aunt    Alcohol abuse Maternal Uncle    Hyperlipidemia Paternal Aunt    Hashimoto's thyroiditis Paternal Aunt    Suicidality Neg Hx    Urticaria Neg Hx    Angioedema Neg Hx     Social History   Tobacco Use   Smoking status: Never   Smokeless tobacco: Never  Vaping Use   Vaping Use: Never used  Substance Use Topics   Alcohol use: Not Currently    Alcohol/week: 1.0 standard drink of alcohol  Types: 1 Glasses of wine per week    Comment: occ   Drug use: No    Allergies:  Allergies  Allergen Reactions   Latex Swelling and Rash   Barley Grass Hives   Cranberry Juice Powder Other (See Comments)    Throat pain.   Naproxen Other (See Comments)    "Liver starts hurting" Diarrhea   Nickel     Skin peeling   Sulfa Antibiotics Hives and Swelling    Medications Prior to Admission  Medication Sig Dispense Refill Last Dose   cetirizine (ZYRTEC ALLERGY) 10 MG tablet Take 1 tablet (10 mg total) by mouth 2 (two) times daily. 60 tablet 3 10/14/2021   clindamycin (CLEOCIN T) 1 % lotion Apply a small amount to skin once to twice daily as needed for flare 60 mL 5 10/14/2021   Cyanocobalamin (VITAMIN B-12) 1000 MCG SUBL Place under the tongue.   Past Week   diphenhydrAMINE (BENADRYL) 25 MG tablet Take 25 mg by mouth every 6 (six) hours as needed for allergies or sleep.   Past Month   FOLIC ACID PO Take 1 tablet by mouth daily.   10/14/2021   magnesium 30 MG tablet Take 30 mg by mouth 2 (two) times daily.    10/14/2021   Prenatal Vit-Fe Fumarate-FA (PRENATAL MULTIVITAMIN) TABS tablet Take 1 tablet by mouth daily at 12 noon.   10/14/2021   Vitamin D, Ergocalciferol, (DRISDOL) 1.25 MG (50000 UNIT) CAPS capsule Take 1 capsule by mouth every 7 days. 12 capsule 0 10/14/2021   albuterol (VENTOLIN HFA) 108 (90 Base) MCG/ACT inhaler Inhale 1-2 puffs into the lungs 15 minutes prior to exercise as directed. (Patient not taking: Reported on 10/06/2021) 18 g 5 Unknown   ondansetron (ZOFRAN ODT) 4 MG disintegrating tablet Dissolve 1 tablet (4 mg total) by mouth every 8 (eight) hours as needed for nausea or vomiting. (Patient not taking: Reported on 08/18/2021) 20 tablet 5 Unknown   promethazine (PHENERGAN) 25 MG tablet Take 1 tablet by mouth every 6 hours as needed for nausea or vomiting. (Patient not taking: Reported on 10/11/2021) 30 tablet 0 Unknown   SUMAtriptan (IMITREX) 20 MG/ACT nasal spray Use 1 spray in one nostril as directed.  May repeat once in 2 hours if headache persists or recurs. (Patient not taking: Reported on 08/18/2021) 6 each 5 Unknown    Review of Systems  Constitutional:  Positive for activity change. Negative for appetite change, fatigue and fever.  Eyes:  Positive for visual disturbance. Negative for photophobia.  Respiratory:  Negative for shortness of breath.   Cardiovascular:  Negative for chest pain and palpitations.  Gastrointestinal:  Positive for nausea. Negative for vomiting.  Genitourinary:  Negative for pelvic pain, vaginal bleeding, vaginal discharge and vaginal pain.  Neurological:  Positive for dizziness, syncope and light-headedness.   Physical Exam   Blood pressure 108/70, pulse (!) 59, temperature 98.8 F (37.1 C), temperature source Oral, resp. rate 16, height 5\' 6"  (1.676 m), weight 90.6 kg, last menstrual period 08/09/2021, SpO2 100 %.  Physical Exam Vitals and nursing note reviewed.  Constitutional:      General: She is not in acute distress.    Appearance: She is  ill-appearing.  HENT:     Head: Normocephalic.  Cardiovascular:     Rate and Rhythm: Normal rate.     Heart sounds: Normal heart sounds.  Pulmonary:     Effort: Pulmonary effort is normal.     Breath sounds: Normal breath sounds.  Musculoskeletal:  General: Normal range of motion.     Cervical back: Normal range of motion.  Skin:    General: Skin is warm and dry.  Neurological:     General: No focal deficit present.     Mental Status: She is alert and oriented to person, place, and time.  Psychiatric:        Mood and Affect: Mood normal.     MAU Course  Procedures Lab Orders         Urinalysis, Routine w reflex microscopic Urine, Clean Catch         CBC         Comprehensive metabolic panel    Meds ordered this encounter  Medications   DISCONTD: scopolamine (TRANSDERM-SCOP) 1 MG/3DAYS 1.5 mg   sodium chloride 0.9 % bolus 2,000 mL   Doxylamine-Pyridoxine 10-10 MG TBEC    Sig: Take 1/2 tab by mouth at bedtime as needed.    Dispense:  120 tablet    Refill:  0    DAW9    Order Specific Question:   Supervising Provider    Answer:   Reva Bores [[id number]]   scopolamine (TRANSDERM-SCOP) 1 MG/3DAYS    Sig: Place 1 patch (1.5 mg total) onto the skin once for 1 dose.    Dispense:  1 patch    Refill:  0    Order Specific Question:   Supervising Provider    Answer:   Samara Snide   Results for orders placed or performed during the hospital encounter of 10/15/21 (from the past 24 hour(s))  Urinalysis, Routine w reflex microscopic Urine, Clean Catch     Status: Abnormal   Collection Time: 10/15/21 12:36 PM  Result Value Ref Range   Color, Urine COLORLESS (A) YELLOW   APPearance CLEAR CLEAR   Specific Gravity, Urine 1.004 (L) 1.005 - 1.030   pH 8.0 5.0 - 8.0   Glucose, UA NEGATIVE NEGATIVE mg/dL   Hgb urine dipstick NEGATIVE NEGATIVE   Bilirubin Urine NEGATIVE NEGATIVE   Ketones, ur NEGATIVE NEGATIVE mg/dL   Protein, ur NEGATIVE NEGATIVE mg/dL   Nitrite  NEGATIVE NEGATIVE   Leukocytes,Ua NEGATIVE NEGATIVE  CBC     Status: Abnormal   Collection Time: 10/15/21 12:56 PM  Result Value Ref Range   WBC 8.0 4.0 - 10.5 K/uL   RBC 3.79 (L) 3.87 - 5.11 MIL/uL   Hemoglobin 11.9 (L) 12.0 - 15.0 g/dL   HCT 85.0 (L) 27.7 - 41.2 %   MCV 92.1 80.0 - 100.0 fL   MCH 31.4 26.0 - 34.0 pg   MCHC 34.1 30.0 - 36.0 g/dL   RDW 87.8 67.6 - 72.0 %   Platelets 173 150 - 400 K/uL   nRBC 0.0 0.0 - 0.2 %  Comprehensive metabolic panel     Status: Abnormal   Collection Time: 10/15/21 12:56 PM  Result Value Ref Range   Sodium 138 135 - 145 mmol/L   Potassium 4.2 3.5 - 5.1 mmol/L   Chloride 109 98 - 111 mmol/L   CO2 20 (L) 22 - 32 mmol/L   Glucose, Bld 74 70 - 99 mg/dL   BUN 7 6 - 20 mg/dL   Creatinine, Ser 9.47 0.44 - 1.00 mg/dL   Calcium 8.5 (L) 8.9 - 10.3 mg/dL   Total Protein 6.2 (L) 6.5 - 8.1 g/dL   Albumin 3.7 3.5 - 5.0 g/dL   AST 18 15 - 41 U/L   ALT 15 0 - 44 U/L  Alkaline Phosphatase 30 (L) 38 - 126 U/L   Total Bilirubin 0.6 0.3 - 1.2 mg/dL   GFR, Estimated >85 >63 mL/min   Anion gap 9 5 - 15   Meds ordered this encounter  Medications   DISCONTD: scopolamine (TRANSDERM-SCOP) 1 MG/3DAYS 1.5 mg   sodium chloride 0.9 % bolus 2,000 mL   Doxylamine-Pyridoxine 10-10 MG TBEC    Sig: Take 1/2 tab by mouth at bedtime as needed.    Dispense:  120 tablet    Refill:  0    DAW9    Order Specific Question:   Supervising Provider    Answer:   Reva Bores [[id number]]   scopolamine (TRANSDERM-SCOP) 1 MG/3DAYS    Sig: Place 1 patch (1.5 mg total) onto the skin once for 1 dose.    Dispense:  1 patch    Refill:  0    Order Specific Question:   Supervising Provider    Answer:   Samara Snide    MDM Labs normal. Patient with previous history of POTS.  Dizziness and nausea improved with 06-998 ml of Lactated ringers and a scope patch. Patient still very dizzy upon standing but otherwise improved. N. Nugent NP performed a bedside ultrasound and noted  +FM and a fetus consistent with ~[redacted] weeks gestation. Please see her note.   Assessment and Plan   1. Vertigo   2. Supervision of other normal pregnancy, antepartum   3. [redacted] weeks gestation of pregnancy   4. Lightheadedness   5. POTS (postural orthostatic tachycardia syndrome)    - POTS with new diagnosis of Vertigo. Patient sent home with 1 scope patch and a outpatient order was sent to her pharmacy. - Also recommended the use of Unisom and B6 for normal early pregnancy nausea. - Encouraged small amounts of Salt intake on foods.  - AMB Physical Therapy Referral for Vestibular Rehabilitation Therapy.    - Outpatient referral sent to [facility] K. Tobb, [facility] for POTS follow up.  - Worsening symptoms warranting return to MAU discussed.  - Patient discharged home in stable condition.  - Patient may return to MAU as needed.   Claudette Head, CNM  10/15/2021, 12:29 PM

## 2021-10-17 ENCOUNTER — Other Ambulatory Visit (HOSPITAL_COMMUNITY): Payer: Self-pay

## 2021-10-17 ENCOUNTER — Ambulatory Visit (INDEPENDENT_AMBULATORY_CARE_PROVIDER_SITE_OTHER): Payer: No Typology Code available for payment source | Admitting: Licensed Clinical Social Worker

## 2021-10-17 DIAGNOSIS — F411 Generalized anxiety disorder: Secondary | ICD-10-CM | POA: Diagnosis not present

## 2021-10-19 ENCOUNTER — Ambulatory Visit (INDEPENDENT_AMBULATORY_CARE_PROVIDER_SITE_OTHER): Payer: No Typology Code available for payment source | Admitting: Physical Therapy

## 2021-10-19 ENCOUNTER — Other Ambulatory Visit: Payer: Self-pay

## 2021-10-19 ENCOUNTER — Encounter: Payer: Self-pay | Admitting: Physical Therapy

## 2021-10-19 DIAGNOSIS — R42 Dizziness and giddiness: Secondary | ICD-10-CM | POA: Diagnosis not present

## 2021-10-19 DIAGNOSIS — R2681 Unsteadiness on feet: Secondary | ICD-10-CM | POA: Diagnosis not present

## 2021-10-19 NOTE — Therapy (Signed)
OUTPATIENT PHYSICAL THERAPY VESTIBULAR EVALUATION     Patient Name: Cindy Jones MRN: [id number] DOB:January 19, 1991, 31 y.o., female Today's Date: 10/19/2021  PCP: Felix Pacini, DO REFERRING PROVIDER:   Carlynn Herald, CNM     PT End of Session - 10/19/21 0839     Visit Number 1    Number of Visits 6    Date for PT Re-Evaluation 11/30/21    Authorization Type [facility]    PT Start Time 0905    PT Stop Time 0937    PT Time Calculation (min) 32 min    Activity Tolerance Patient tolerated treatment well    Behavior During Therapy [facility] for tasks assessed/performed             Past Medical History:  Diagnosis Date   Anxiety    Asthma    as a child   Binge eating disorder    Dysrhythmia    " I feel it about once a month"   Frequent headaches    GAD (generalized anxiety disorder)    GERD (gastroesophageal reflux disease)    History of acute PID 06/09/2019   Major depression in complete remission (HCC)    Migraines    PID (acute pelvic inflammatory disease)    Seasonal allergies    Tourette disorder    Urinary tract bacterial infections    Past Surgical History:  Procedure Laterality Date   CHOLECYSTECTOMY     ESOPHAGOGASTRODUODENOSCOPY     LAPAROSCOPIC CHOLECYSTECTOMY SINGLE SITE WITH INTRAOPERATIVE CHOLANGIOGRAM N/A 03/15/2016   Procedure: LAPAROSCOPIC CHOLECYSTECTOMY SINGLE SITE WITH INTRAOPERATIVE CHOLANGIOGRAM;  Surgeon: Karie Soda, MD;  Location: [facility] OR;  Service: General;  Laterality: N/A;   TONSILLECTOMY AND ADENOIDECTOMY  2013   WISDOM TOOTH EXTRACTION     Patient Active Problem List   Diagnosis Date Noted   Supervision of other normal pregnancy, antepartum 10/11/2021   Encounter for preconception consultation 09/01/2021   History of asthma 09/23/2020   Urticaria 08/24/2020   Other adverse food reactions, not elsewhere classified, subsequent encounter 08/24/2020   Other allergic rhinitis 08/24/2020   B12 deficiency 07/01/2020    Exercise-induced asthma 10/27/2019   Overweight (BMI 25.0-29.9) 06/03/2018   Bulimia 04/20/2015   ADD (attention deficit disorder) 01/15/2014   Tourette disease 10/18/2013    ONSET DATE: 10/15/21  REFERRING DIAG: R42 (ICD-10-CM) - Vertigo   THERAPY DIAG:  Dizziness and giddiness - Plan: PT plan of care cert/re-cert  Unsteadiness on feet - Plan: PT plan of care cert/re-cert  Rationale for Evaluation and Treatment Rehabilitation  SUBJECTIVE:   SUBJECTIVE STATEMENT: Pt reports she work up on Saturday morning with dizziness and almost falling out of her bed.  She still has difficulty with feeling like her eyes aren't focusing.  Pt accompanied by: self  PERTINENT HISTORY: anxiety, asthma, depression, migraines, tourette syndrome, currently pregnant (1st trimester), POTS   PAIN:  Are you having pain? No  PRECAUTIONS: Other: Pregnant  WEIGHT BEARING RESTRICTIONS No  FALLS: Has patient fallen in last 6 months? No  LIVING ENVIRONMENT: Lives with: lives with their spouse Lives in: House/apartment Stairs: No  PLOF: Independent  PATIENT GOALS improve dizziness  OBJECTIVE:   COGNITION: Overall cognitive status: Within functional limits for tasks assessed   SENSATION: WFL  VESTIBULAR ASSESSMENT   GENERAL OBSERVATION: symptoms at rest 3-4/10    SYMPTOM BEHAVIOR:   Subjective history: reports hx of vertigo in 2018 after surgery   Non-Vestibular symptoms: changes in vision and tinnitus has resolved, c/o blurry vision  Type of dizziness: Blurred Vision, Imbalance (Disequilibrium), and Spinning/Vertigo   Frequency: constant   Duration: constant   Aggravating factors: Induced by position change: rolling to the right, supine to sit, and sit to stand and Worse with fatigue   Relieving factors: closing eyes   Progression of symptoms: better   OCULOMOTOR EXAM:   Ocular Alignment: normal   Ocular ROM: No Limitations   Spontaneous Nystagmus: absent   Gaze-Induced  Nystagmus: absent   Smooth Pursuits: intact   Saccades: dysmetria, hypermetric/overshoots, and Lt > Rt       VESTIBULAR - OCULAR REFLEX:    Slow VOR: Comment: increase in symptoms but WNL   Head-Impulse Test: HIT Right: positive HIT Left: negative   Dynamic Visual Acuity:  not address today    POSITIONAL TESTING: Right Dix-Hallpike: no nystagmus Left Dix-Hallpike: no nystagmus Right Roll Test: no nystagmus Left Roll Test: no nystagmus    MOTION SENSITIVITY: 10/19/21   Motion Sensitivity Quotient  Intensity: 0 = none, 1 = Lightheaded, 2 = Mild, 3 = Moderate, 4 = Severe, 5 = Vomiting  Intensity  1. Sitting to supine 0  2. Supine to L side  0  3. Supine to R side 2  4. Supine to sitting 1  5. L Hallpike-Dix 2  6. Up from L  2  7. R Hallpike-Dix 2  8. Up from R  2  9. Sitting, head  tipped to L knee   10. Head up from L  knee   11. Sitting, head  tipped to R knee   12. Head up from R  knee   13. Sitting head turns x5 3  14.Sitting head nods x5 2  15. In stance, 180  turn to L    16. In stance, 180  turn to R     OTHOSTATICS: not done   VESTIBULAR TREATMENT: Gaze Adaptation:   x1 Viewing Horizontal: Position: sitting and Time: 30 sec and x1 Viewing Vertical:  Position: sitting and Time: 30 sec  HOME EXERCISES: Access Code: [email or web address] URL: [email or web address] Date: 10/19/2021 Prepared by: Moshe Cipro  Exercises - Seated Gaze Stabilization with Head Rotation  - 2 x daily - 7 x weekly - 1 sets - 30 seconds - Seated Gaze Stabilization with Head Nod  - 2 x daily - 7 x weekly - 1 sets - 30 seconds  Patient Education - Gaze Stabilization  PATIENT EDUCATION: Education details: HEP Person educated: Patient Education method: Programmer, multimedia, Demonstration, and Handouts Education comprehension: verbalized understanding, returned demonstration, and needs further education   GOALS: Goals reviewed with patient? Yes  SHORT TERM GOALS: Target  date: 11/09/2021   Independent with initial HEP Goal status: INITIAL   LONG TERM GOALS: Target date: 11/30/2021    Report symptoms 75% improved Goal status: INITIAL  2.  Demonstrate ability to perform positional changes and amb with head turns without symptoms Goal status: INITIAL  3.  Independent with HEP if still indicated Goal status: INITIAL  ASSESSMENT:  CLINICAL IMPRESSION: Patient is a 31 y.o. female who was seen today for physical therapy evaluation and treatment for vertigo.  Positional testing is negative but she is symptomatic with positional changes and horizontal head turns indicating vestibular hypofunction.  She did have recent cold so could have a component of vestibular neuritis.  She is also off of all migraine medication due to pregnancy, so cannot completely rule out vestibular migraines.  Will continue to see to address deficits listed.    OBJECTIVE IMPAIRMENTS decreased  balance, difficulty walking, and dizziness.   ACTIVITY LIMITATIONS bending, sitting, standing, transfers, bed mobility, and locomotion level  PARTICIPATION LIMITATIONS: meal prep, cleaning, laundry, community activity, and occupation  PERSONAL FACTORS 3+ comorbidities: anxiety, asthma, depression, migraines, tourette syndrome, currently pregnant (1st trimester), POTS  are also affecting patient's functional outcome.   REHAB POTENTIAL: Good  CLINICAL DECISION MAKING: Evolving/moderate complexity  EVALUATION COMPLEXITY: Moderate   PLAN: PT FREQUENCY: 1x/week  PT DURATION: 6 weeks  PLANNED INTERVENTIONS: Therapeutic exercises, Therapeutic activity, Neuromuscular re-education, Balance training, Gait training, Patient/Family education, Vestibular training, Canalith repositioning, Dry Needling, Electrical stimulation, Cryotherapy, Moist heat, Taping, and Manual therapy  PLAN FOR NEXT SESSION: reassess and review HEP, may want to check DVA and orthostatics     Clarita Crane, PT,  DPT 10/19/21 10:28 AM

## 2021-10-21 ENCOUNTER — Other Ambulatory Visit: Payer: No Typology Code available for payment source

## 2021-10-21 ENCOUNTER — Other Ambulatory Visit (HOSPITAL_COMMUNITY)
Admission: AD | Admit: 2021-10-21 | Discharge: 2021-10-21 | Disposition: A | Discharge status: Home / Self Care | Payer: No Typology Code available for payment source | Source: Ambulatory Visit | Attending: Family Medicine | Admitting: Family Medicine

## 2021-10-21 ENCOUNTER — Other Ambulatory Visit: Payer: Self-pay

## 2021-10-21 DIAGNOSIS — Z3480 Encounter for supervision of other normal pregnancy, unspecified trimester: Secondary | ICD-10-CM

## 2021-10-21 LAB — TYPE AND SCREEN
ABO/RH(D): B POS
Antibody Screen: NEGATIVE

## 2021-10-21 LAB — CBC
HCT: 33.6 % — ABNORMAL LOW (ref 36.0–46.0)
Hemoglobin: 11.4 g/dL — ABNORMAL LOW (ref 12.0–15.0)
MCH: 30.7 pg (ref 26.0–34.0)
MCHC: 33.9 g/dL (ref 30.0–36.0)
MCV: 90.6 fL (ref 80.0–100.0)
Platelets: 174 10*3/uL (ref 150–400)
RBC: 3.71 MIL/uL — ABNORMAL LOW (ref 3.87–5.11)
RDW: 12 % (ref 11.5–15.5)
WBC: 9.9 10*3/uL (ref 4.0–10.5)
nRBC: 0 % (ref 0.0–0.2)

## 2021-10-21 LAB — HEPATITIS B SURFACE ANTIGEN: Hepatitis B Surface Ag: NONREACTIVE

## 2021-10-21 LAB — HEMOGLOBIN A1C
Hgb A1c MFr Bld: 4.9 % (ref 4.8–5.6)
Mean Plasma Glucose: 93.93 mg/dL

## 2021-10-21 LAB — HEPATITIS C ANTIBODY: HCV Ab: NONREACTIVE

## 2021-10-21 LAB — HIV ANTIBODY (ROUTINE TESTING W REFLEX): HIV Screen 4th Generation wRfx: NONREACTIVE

## 2021-10-24 ENCOUNTER — Ambulatory Visit (INDEPENDENT_AMBULATORY_CARE_PROVIDER_SITE_OTHER): Payer: No Typology Code available for payment source | Admitting: Licensed Clinical Social Worker

## 2021-10-24 ENCOUNTER — Encounter (HOSPITAL_COMMUNITY): Payer: Self-pay | Admitting: Licensed Clinical Social Worker

## 2021-10-24 DIAGNOSIS — F411 Generalized anxiety disorder: Secondary | ICD-10-CM

## 2021-10-24 NOTE — Progress Notes (Signed)
Virtual Visit via Video Note  I connected with Cindy Jones on 10/24/21 at 1:00pm EST by a video enabled telemedicine application and verified that I am speaking with the correct person using two identifiers.   I discussed the limitations of evaluation and management by telemedicine and the availability of in person appointments. The patient expressed understanding and agreed to proceed.  LOCATION: Patient: Home Provider: Home Office  History of Present Illness: Pt was referred to therapy by her psychiatrist at [facility] for anxiety and panic attacks.   Participation Level: Active   Type of Therapy: Virtual Video individual therapy   Treatment Goal Addressed: Pt will verbalize an understanding of how thoughts, physical feelings, and behavioral actions contribute to anxiety AEB self report  Interventions: CBT/Supportive/Psychoeducation  Summary: Patient presented for today's session sick with a cold/sinus and morning sickness on time and was alert, oriented x5, with no evidence or self-report of SI/HI or A/V H. Clinician inquired about patient's current emotional ratings, as well as any significant changes in thoughts, feelings or behavior since previous session. Patient's ratings 1/10 for depression, 5/10 for anxiety, 3/10 for anger/irritability, and denied any panic attacks since last session. Cln and pt explored emotional ratings and coping skills. Clinician utilized CBT to process pregnancy concerns and work challenges. Clinician processed thoughts, feelings, and behaviors. Clinician discussed coping skills and options for supporting herself through these challenging times. Cln provided an update on family, work, stressors, health, pregnancy journey. Pt tearfully shares tearfully "I'm struggling with the effects of being pregnant, having blood drawn, not being 100% at work, my husband not understanding my limitations."  Pt began family therapy with her mother and her therapist at [facility]. Cln helped  pt process feelings of the session.  PLAN: screenings   PLAN: therapy and anxiety notebooks, takeaways, goals   Collaboration of Care: Other: None at this session.  Patient/Guardian was advised Release of Information must be obtained prior to any record release in order to collaborate their care with an outside provider. Patient/Guardian was advised if they have not already done so to contact the registration department to sign all necessary forms in order for [place] to release information regarding their care.   Consent: Patient/Guardian gives verbal consent for treatment and assignment of benefits for services provided during this visit. Patient/Guardian expressed understanding and agreed to proceed.      Assessment and plan: Counselor will continue to meet with patient to address treatment plan goals. Patient will continue to follow recommendations of providers and implement skills learned in session.  Suicidal/Homicidal: Nowithout intent/plan  Therapist Response: Assessed pt's current functioning and reviewed progress.. Assisted pt processing stressors, negative thoughts.  Assisted pt processing for the management of her stressors.  Participation Level: Active  Diagnosis: Axis I.:  GAD    Follow Up Instructions: I discussed the assessment and treatment plan with the patient. The patient was provided an opportunity to ask questions and all were answered. The patient agreed with the plan and demonstrated an understanding of the instructions.   The patient was advised to call back or seek an in-person evaluation if the symptoms worsen or if the condition fails to improve as anticipated.  I provided 60 minutes of non-face-to-face time during this encounter.   Tamyra Fojtik S, LCAS

## 2021-10-26 LAB — PANORAMA PRENATAL TEST FULL PANEL:PANORAMA TEST PLUS 5 ADDITIONAL MICRODELETIONS

## 2021-10-27 ENCOUNTER — Encounter: Payer: Self-pay | Admitting: Physical Therapy

## 2021-10-27 ENCOUNTER — Telehealth: Payer: Self-pay

## 2021-10-27 ENCOUNTER — Ambulatory Visit (INDEPENDENT_AMBULATORY_CARE_PROVIDER_SITE_OTHER): Payer: No Typology Code available for payment source | Admitting: Physical Therapy

## 2021-10-27 ENCOUNTER — Other Ambulatory Visit: Payer: Self-pay

## 2021-10-27 ENCOUNTER — Other Ambulatory Visit (INDEPENDENT_AMBULATORY_CARE_PROVIDER_SITE_OTHER): Payer: No Typology Code available for payment source

## 2021-10-27 DIAGNOSIS — R42 Dizziness and giddiness: Secondary | ICD-10-CM

## 2021-10-27 DIAGNOSIS — R2681 Unsteadiness on feet: Secondary | ICD-10-CM | POA: Diagnosis not present

## 2021-10-27 DIAGNOSIS — Z3480 Encounter for supervision of other normal pregnancy, unspecified trimester: Secondary | ICD-10-CM

## 2021-10-27 NOTE — Therapy (Addendum)
OUTPATIENT PHYSICAL THERAPY VESTIBULAR TREATMENT NOTE DISCHARGE SUMMARY   Patient Name: Cindy Jones MRN: [id number] DOB:1990/08/26, 31 y.o., female Today's Date: 10/27/2021  PCP: Howard Pouch, DO REFERRING PROVIDER:    Deloris Ping, CNM     PT End of Session - 10/27/21 [phone]     Visit Number 2    Number of Visits 6    Date for PT Re-Evaluation 11/30/21    Authorization Type [facility]    PT Start Time 0810    PT Stop Time 0839    PT Time Calculation (min) 29 min    Activity Tolerance Patient tolerated treatment well    Behavior During Therapy [facility] for tasks assessed/performed             Past Medical History:  Diagnosis Date   Anxiety    Asthma    as a child   Binge eating disorder    Dysrhythmia    " I feel it about once a month"   Frequent headaches    GAD (generalized anxiety disorder)    GERD (gastroesophageal reflux disease)    History of acute PID 06/09/2019   Major depression in complete remission ([place])    Migraines    PID (acute pelvic inflammatory disease)    Seasonal allergies    Tourette disorder    Urinary tract bacterial infections    Past Surgical History:  Procedure Laterality Date   CHOLECYSTECTOMY     ESOPHAGOGASTRODUODENOSCOPY     LAPAROSCOPIC CHOLECYSTECTOMY SINGLE SITE WITH INTRAOPERATIVE CHOLANGIOGRAM N/A 03/15/2016   Procedure: LAPAROSCOPIC CHOLECYSTECTOMY SINGLE SITE WITH INTRAOPERATIVE CHOLANGIOGRAM;  Surgeon: Michael Boston, MD;  Location: [place] OR;  Service: General;  Laterality: N/A;   TONSILLECTOMY AND ADENOIDECTOMY  2013   WISDOM TOOTH EXTRACTION     Patient Active Problem List   Diagnosis Date Noted   Supervision of other normal pregnancy, antepartum 10/11/2021   Encounter for preconception consultation 09/01/2021   History of asthma 09/23/2020   Urticaria 08/24/2020   Other adverse food reactions, not elsewhere classified, subsequent encounter 08/24/2020   Other allergic rhinitis 08/24/2020   B12 deficiency  07/01/2020   Exercise-induced asthma 10/27/2019   Overweight (BMI 25.0-29.9) 06/03/2018   Bulimia 04/20/2015   ADD (attention deficit disorder) 01/15/2014   Tourette disease 10/18/2013    ONSET DATE: 10/15/21  REFERRING DIAG: R42 (ICD-10-CM) - Vertigo  THERAPY DIAG:  Dizziness and giddiness  Unsteadiness on feet  Rationale for Evaluation and Treatment Rehabilitation  PERTINENT HISTORY: anxiety, asthma, depression, migraines, tourette syndrome, currently pregnant (1st trimester), POTS  PRECAUTIONS: Other: Pregnant  SUBJECTIVE: doing well, symptoms mostly resolved except looking up when walking  PAIN:  Are you having pain? No    OBJECTIVE:    VESTIBULAR TREATMENT: 10/27/21:    Gaze Adaptation: x1 Viewing Horizontal: Position: feet apart on foam, Time: 30 sec, and Reps: 1 and x1 Viewing Vertical:  Position: feet apart on foam, Time: 30 sec, and Reps: 1  Other:   Compliant surface with feet together and EC: horizontal and vertical head turns x10 reps each Amb 20' x 4 with vertical head turns with mild symptoms and decreased gait velocity   Orthostatic VS for the past 72 hrs (Last 3 readings):  Orthostatic BP Orthostatic Pulse  10/27/21 0832 104/78 94  10/27/21 0831 135/89 97  10/27/21 0830 119/74 89      HOME EXERCISES: Access Code: [email or web address] URL: [email or web address] Date: 10/19/2021 Prepared by: Faustino Congress   Exercises - Seated Gaze Stabilization with Head Rotation  -  2 x daily - 7 x weekly - 1 sets - 30 seconds - Seated Gaze Stabilization with Head Nod  - 2 x daily - 7 x weekly - 1 sets - 30 seconds   Patient Education - Gaze Stabilization   PATIENT EDUCATION: Education details: HEP Person educated: Patient Education method: Consulting civil engineer, Demonstration, and Handouts Education comprehension: verbalized understanding, returned demonstration, and needs further education     GOALS: Goals reviewed with patient? Yes   SHORT TERM  GOALS: Target date: 11/09/2021    Independent with initial HEP Goal status: MET 10/27/21     LONG TERM GOALS: Target date: 11/30/2021     Report symptoms 75% improved Goal status: INITIAL   2.  Demonstrate ability to perform positional changes and amb with head turns without symptoms Goal status: INITIAL   3.  Independent with HEP if still indicated Goal status: INITIAL   ASSESSMENT:   CLINICAL IMPRESSION: Overall symptoms improved with mild symptoms with vertical head turns.  Feel this could be related to decreased visual input as well as possible mild orthostatic symptoms.  Updated HEP to address difficulties.  Will continue to benefit from PT to maximize function.     OBJECTIVE IMPAIRMENTS decreased balance, difficulty walking, and dizziness.    ACTIVITY LIMITATIONS bending, sitting, standing, transfers, bed mobility, and locomotion level   PARTICIPATION LIMITATIONS: meal prep, cleaning, laundry, community activity, and occupation   PERSONAL FACTORS 3+ comorbidities: anxiety, asthma, depression, migraines, tourette syndrome, currently pregnant (1st trimester), POTS  are also affecting patient's functional outcome.    REHAB POTENTIAL: Good   CLINICAL DECISION MAKING: Evolving/moderate complexity   EVALUATION COMPLEXITY: Moderate     PLAN: PT FREQUENCY: 1x/week   PT DURATION: 6 weeks   PLANNED INTERVENTIONS: Therapeutic exercises, Therapeutic activity, Neuromuscular re-education, Balance training, Gait training, Patient/Family education, Vestibular training, Canalith repositioning, Dry Needling, Electrical stimulation, Cryotherapy, Moist heat, Taping, and Manual therapy   PLAN FOR NEXT SESSION: reassess and review HEP, may want to check DVA     Laureen Abrahams, PT, DPT 10/27/21 8:44 AM    PHYSICAL THERAPY DISCHARGE SUMMARY  Visits from Start of Care: 2  Current functional level related to goals / functional outcomes: See above   Remaining deficits: N/a    Education / Equipment: HEP   Patient agrees to discharge. Patient goals were not met. Patient is being discharged due to being pleased with the current functional level.  Laureen Abrahams, PT, DPT 12/30/21 10:34 AM [facility] [address] [place], [place], [address] Phone: [phone]   Fax:  [phone]

## 2021-10-27 NOTE — Telephone Encounter (Signed)
Patient called into front office and was transferred to clinical staff. Patient states she has been notified that [employer] blood sample was not sufficient. Per chart review [employer] is processing and Cindy Hazy will need redraw. Notified [employer] rep Ron Parker who will set up mobile redraw appt with patient.

## 2021-10-27 NOTE — Progress Notes (Signed)
Here for [employer] redraw. Specimen will have rush processing per Kela Millin rep.   Fleet Contras RN

## 2021-10-30 LAB — HORIZON CUSTOM: REPORT SUMMARY: NEGATIVE

## 2021-10-31 ENCOUNTER — Ambulatory Visit (HOSPITAL_COMMUNITY): Payer: No Typology Code available for payment source | Admitting: Licensed Clinical Social Worker

## 2021-11-02 ENCOUNTER — Encounter: Payer: Self-pay | Admitting: Family Medicine

## 2021-11-02 ENCOUNTER — Other Ambulatory Visit: Payer: Self-pay

## 2021-11-02 ENCOUNTER — Ambulatory Visit (INDEPENDENT_AMBULATORY_CARE_PROVIDER_SITE_OTHER): Payer: No Typology Code available for payment source | Admitting: Family Medicine

## 2021-11-02 ENCOUNTER — Other Ambulatory Visit (HOSPITAL_COMMUNITY)
Admission: RE | Admit: 2021-11-02 | Discharge: 2021-11-02 | Disposition: A | Discharge status: Home / Self Care | Payer: No Typology Code available for payment source | Source: Ambulatory Visit | Attending: Family Medicine | Admitting: Family Medicine

## 2021-11-02 VITALS — BP 116/77 | HR 93 | Wt 201.5 lb

## 2021-11-02 DIAGNOSIS — Z3480 Encounter for supervision of other normal pregnancy, unspecified trimester: Secondary | ICD-10-CM | POA: Diagnosis present

## 2021-11-02 DIAGNOSIS — Z3481 Encounter for supervision of other normal pregnancy, first trimester: Secondary | ICD-10-CM | POA: Diagnosis not present

## 2021-11-02 DIAGNOSIS — F411 Generalized anxiety disorder: Secondary | ICD-10-CM | POA: Diagnosis not present

## 2021-11-02 DIAGNOSIS — Z3A12 12 weeks gestation of pregnancy: Secondary | ICD-10-CM | POA: Diagnosis not present

## 2021-11-02 DIAGNOSIS — F325 Major depressive disorder, single episode, in full remission: Secondary | ICD-10-CM

## 2021-11-02 LAB — PANORAMA PRENATAL TEST FULL PANEL:PANORAMA TEST PLUS 5 ADDITIONAL MICRODELETIONS: FETAL FRACTION: 5.4

## 2021-11-02 NOTE — Patient Instructions (Addendum)
Second Trimester of Pregnancy  The second trimester of pregnancy is from week 13 through week 27. This is months 4 through 6 of pregnancy. The second trimester is often a time when you feel your best. Your body has adjusted to being pregnant, and you begin to feel better physically. During the second trimester: Morning sickness has lessened or stopped completely. You may have more energy. You may have an increase in appetite. The second trimester is also a time when the unborn baby (fetus) is growing rapidly. At the end of the sixth month, the fetus may be up to 12 inches long and weigh about 1 pounds. You will likely begin to feel the baby move (quickening) between 16 and 20 weeks of pregnancy. Body changes during your second trimester Your body continues to go through many changes during your second trimester. The changes vary and generally return to normal after the baby is born. Physical changes Your weight will continue to increase. You will notice your lower abdomen bulging out. You may begin to get stretch marks on your hips, abdomen, and breasts. Your breasts will continue to grow and to become tender. Dark spots or blotches (chloasma or mask of pregnancy) may develop on your face. A dark line from your belly button to the pubic area (linea nigra) may appear. You may have changes in your hair. These can include thickening of your hair, rapid growth, and changes in texture. Some people also have hair loss during or after pregnancy, or hair that feels dry or thin. Health changes You may develop headaches. You may have heartburn. You may develop constipation. You may develop hemorrhoids or swollen, bulging veins (varicose veins). Your gums may bleed and may be sensitive to brushing and flossing. You may urinate more often because the fetus is pressing on your bladder. You may have back pain. This is caused by: Weight gain. Pregnancy hormones that are relaxing the joints in your  pelvis. A shift in weight and the muscles that support your balance. Follow these instructions at home: Medicines Follow your health care provider's instructions regarding medicine use. Specific medicines may be either safe or unsafe to take during pregnancy. Do not take any medicines unless approved by your health care provider. Take a prenatal vitamin that contains at least 600 micrograms (mcg) of folic acid. Eating and drinking Eat a healthy diet that includes fresh fruits and vegetables, whole grains, good sources of protein such as meat, eggs, or tofu, and low-fat dairy products. Avoid raw meat and unpasteurized juice, milk, and cheese. These carry germs that can harm you and your baby. You may need to take these actions to prevent or treat constipation: Drink enough fluid to keep your urine pale yellow. Eat foods that are high in fiber, such as beans, whole grains, and fresh fruits and vegetables. Limit foods that are high in fat and processed sugars, such as fried or sweet foods. Activity Exercise only as directed by your health care provider. Most people can continue their usual exercise routine during pregnancy. Try to exercise for 30 minutes at least 5 days a week. Stop exercising if you develop contractions in your uterus. Stop exercising if you develop pain or cramping in the lower abdomen or lower back. Avoid exercising if it is very hot or humid or if you are at a high altitude. Avoid heavy lifting. If you choose to, you may have sex unless your health care provider tells you not to. Relieving pain and discomfort Wear a supportive  bra to prevent discomfort from breast tenderness. Take warm sitz baths to soothe any pain or discomfort caused by hemorrhoids. Use hemorrhoid cream if your health care provider approves. Rest with your legs raised (elevated) if you have leg cramps or low back pain. If you develop varicose veins: Wear support hose as told by your health care  provider. Elevate your feet for 15 minutes, 3-4 times a day. Limit salt in your diet. Safety Wear your seat belt at all times when driving or riding in a car. Talk with your health care provider if someone is verbally or physically abusive to you. Lifestyle Do not use hot tubs, steam rooms, or saunas. Do not douche. Do not use tampons or scented sanitary pads. Avoid cat litter boxes and soil used by cats. These carry germs that can cause birth defects in the baby and possibly loss of the fetus by miscarriage or stillbirth. Do not use herbal remedies, alcohol, illegal drugs, or medicines that are not approved by your health care provider. Chemicals in these products can harm your baby. Do not use any products that contain nicotine or tobacco, such as cigarettes, e-cigarettes, and chewing tobacco. If you need help quitting, ask your health care provider. General instructions During a routine prenatal visit, your health care provider will do a physical exam and other tests. He or she will also discuss your overall health. Keep all follow-up visits. This is important. Ask your health care provider for a referral to a local prenatal education class. Ask for help if you have counseling or nutritional needs during pregnancy. Your health care provider can offer advice or refer you to specialists for help with various needs. Where to find more information American Pregnancy Association: americanpregnancy.org American College of Obstetricians and Gynecologists: acog.org/en/Womens%20Health/Pregnancy Office on Women's Health: womenshealth.gov/pregnancy Contact a health care provider if you have: A headache that does not go away when you take medicine. Vision changes or you see spots in front of your eyes. Mild pelvic cramps, pelvic pressure, or nagging pain in the abdominal area. Persistent nausea, vomiting, or diarrhea. A bad-smelling vaginal discharge or foul-smelling urine. Pain when you  urinate. Sudden or extreme swelling of your face, hands, ankles, feet, or legs. A fever. Get help right away if you: Have fluid leaking from your vagina. Have spotting or bleeding from your vagina. Have severe abdominal cramping or pain. Have difficulty breathing. Have chest pain. Have fainting spells. Have not felt your baby move for the time period told by your health care provider. Have new or increased pain, swelling, or redness in an arm or leg. Summary The second trimester of pregnancy is from week 13 through week 27 (months 4 through 6). Do not use herbal remedies, alcohol, illegal drugs, or medicines that are not approved by your health care provider. Chemicals in these products can harm your baby. Exercise only as directed by your health care provider. Most people can continue their usual exercise routine during pregnancy. Keep all follow-up visits. This is important. This information is not intended to replace advice given to you by your health care provider. Make sure you discuss any questions you have with your health care provider. Document Revised: 09/24/2019 Document Reviewed: 07/31/2019 Elsevier Patient Education  2023 Elsevier Inc.  Contraception Choices Contraception, also called birth control, refers to methods or devices that prevent pregnancy. Hormonal methods  Contraceptive implant A contraceptive implant is a thin, plastic tube that contains a hormone that prevents pregnancy. It is different from an intrauterine device (  IUD). It is inserted into the upper part of the arm by a health care provider. Implants can be effective for up to 3 years. Progestin-only injections Progestin-only injections are injections of progestin, a synthetic form of the hormone progesterone. They are given every 3 months by a health care provider. Birth control pills Birth control pills are pills that contain hormones that prevent pregnancy. They must be taken once a day, preferably at  the same time each day. A prescription is needed to use this method of contraception. Birth control patch The birth control patch contains hormones that prevent pregnancy. It is placed on the skin and must be changed once a week for three weeks and removed on the fourth week. A prescription is needed to use this method of contraception. Vaginal ring A vaginal ring contains hormones that prevent pregnancy. It is placed in the vagina for three weeks and removed on the fourth week. After that, the process is repeated with a new ring. A prescription is needed to use this method of contraception. Emergency contraceptive Emergency contraceptives prevent pregnancy after unprotected sex. They come in pill form and can be taken up to 5 days after sex. They work best the sooner they are taken after having sex. Most emergency contraceptives are available without a prescription. This method should not be used as your only form of birth control. Barrier methods  Female condom A female condom is a thin sheath that is worn over the penis during sex. Condoms keep sperm from going inside a woman's body. They can be used with a sperm-killing substance (spermicide) to increase their effectiveness. They should be thrown away after one use. Female condom A female condom is a soft, loose-fitting sheath that is put into the vagina before sex. The condom keeps sperm from going inside a woman's body. They should be thrown away after one use. Diaphragm A diaphragm is a soft, dome-shaped barrier. It is inserted into the vagina before sex, along with a spermicide. The diaphragm blocks sperm from entering the uterus, and the spermicide kills sperm. A diaphragm should be left in the vagina for 6-8 hours after sex and removed within 24 hours. A diaphragm is prescribed and fitted by a health care provider. A diaphragm should be replaced every 1-2 years, after giving birth, after gaining more than 15 lb (6.8 kg), and after pelvic  surgery. Cervical cap A cervical cap is a round, soft latex or plastic cup that fits over the cervix. It is inserted into the vagina before sex, along with spermicide. It blocks sperm from entering the uterus. The cap should be left in place for 6-8 hours after sex and removed within 48 hours. A cervical cap must be prescribed and fitted by a health care provider. It should be replaced every 2 years. Sponge A sponge is a soft, circular piece of polyurethane foam with spermicide in it. The sponge helps block sperm from entering the uterus, and the spermicide kills sperm. To use it, you make it wet and then insert it into the vagina. It should be inserted before sex, left in for at least 6 hours after sex, and removed and thrown away within 30 hours. Spermicides Spermicides are chemicals that kill or block sperm from entering the cervix and uterus. They can come as a cream, jelly, suppository, foam, or tablet. A spermicide should be inserted into the vagina with an applicator at least 10-15 minutes before sex to allow time for it to work. The process must be   repeated every time you have sex. Spermicides do not require a prescription. Intrauterine contraception Intrauterine device (IUD) An IUD is a T-shaped device that is put in a woman's uterus. There are two types: Hormone IUD.This type contains progestin, a synthetic form of the hormone progesterone. This type can stay in place for 3-5 years. Copper IUD.This type is wrapped in copper wire. It can stay in place for 10 years. Permanent methods of contraception Female tubal ligation In this method, a woman's fallopian tubes are sealed, tied, or blocked during surgery to prevent eggs from traveling to the uterus. Hysteroscopic sterilization In this method, a small, flexible insert is placed into each fallopian tube. The inserts cause scar tissue to form in the fallopian tubes and block them, so sperm cannot reach an egg. The procedure takes about 3  months to be effective. Another form of birth control must be used during those 3 months. Female sterilization This is a procedure to tie off the tubes that carry sperm (vasectomy). After the procedure, the man can still ejaculate fluid (semen). Another form of birth control must be used for 3 months after the procedure. Natural planning methods Natural family planning In this method, a couple does not have sex on days when the woman could become pregnant. Calendar method In this method, the woman keeps track of the length of each menstrual cycle, identifies the days when pregnancy can happen, and does not have sex on those days. Ovulation method In this method, a couple avoids sex during ovulation. Symptothermal method This method involves not having sex during ovulation. The woman typically checks for ovulation by watching changes in her temperature and in the consistency of cervical mucus. Post-ovulation method In this method, a couple waits to have sex until after ovulation. Where to find more information Centers for Disease Control and Prevention: FootballExhibition.com.br Summary Contraception, also called birth control, refers to methods or devices that prevent pregnancy. Hormonal methods of contraception include implants, injections, pills, patches, vaginal rings, and emergency contraceptives. Barrier methods of contraception can include female condoms, female condoms, diaphragms, cervical caps, sponges, and spermicides. There are two types of IUDs (intrauterine devices). An IUD can be put in a woman's uterus to prevent pregnancy for 3-5 years. Permanent sterilization can be done through a procedure for males and females. Natural family planning methods involve nothaving sex on days when the woman could become pregnant. This information is not intended to replace advice given to you by your health care provider. Make sure you discuss any questions you have with your health care provider. Document  Revised: 09/22/2019 Document Reviewed: 09/22/2019 Elsevier Patient Education  2023 Elsevier Inc.  Safe Medications in Pregnancy   Acne:  Benzoyl Peroxide  Salicylic Acid   Backache/Headache:  Tylenol: 2 regular strength every 4 hours OR               2 Extra strength every 6 hours   Colds/Coughs/Allergies:  Benadryl (alcohol free) 25 mg every 6 hours as needed  Breath right strips  Claritin  Cepacol throat lozenges  Chloraseptic throat spray  Cold-Eeze- up to three times per day  Cough drops, alcohol free  Flonase (by prescription only)  Guaifenesin  Mucinex  Robitussin DM (plain only, alcohol free)  Saline nasal spray/drops  Sudafed (pseudoephedrine) & Actifed * use only after [redacted] weeks gestation and if you do not have high blood pressure  Tylenol  Vicks Vaporub  Zinc lozenges  Zyrtec   Constipation:  Colace  Ducolax suppositories  Fleet enema  Glycerin suppositories  Metamucil  Milk of magnesia  Miralax  Senokot  Smooth move tea   Diarrhea:  Kaopectate  Imodium A-D   *NO pepto Bismol   Hemorrhoids:  Anusol  Anusol HC  Preparation H  Tucks   Indigestion:  Tums  Maalox  Mylanta  Zantac  Pepcid   Insomnia:  Benadryl (alcohol free) 25mg  every 6 hours as needed  Tylenol PM  Unisom, no Gelcaps   Leg Cramps:  Tums  MagGel   Nausea/Vomiting:  Bonine  Dramamine  Emetrol  Ginger extract  Sea bands  Meclizine  Nausea medication to take during pregnancy:  Unisom (doxylamine succinate 25 mg tablets) Take one tablet daily at bedtime. If symptoms are not adequately controlled, the dose can be increased to a maximum recommended dose of two tablets daily (1/2 tablet in the morning, 1/2 tablet mid-afternoon and one at bedtime).  Vitamin B6 100mg  tablets. Take one tablet twice a day (up to 200 mg per day).   Skin Rashes:  Aveeno products  Benadryl cream or 25mg  every 6 hours as needed  Calamine Lotion  1% cortisone cream   Yeast infection:   Gyne-lotrimin 7  Monistat 7    **If taking multiple medications, please check labels to avoid duplicating the same active ingredients  **take medication as directed on the label  ** Do not exceed 4000 mg of tylenol in 24 hours  **Do not take medications that contain aspirin or ibuprofen

## 2021-11-02 NOTE — Progress Notes (Signed)
Subjective:   Cindy Jones is a 31 y.o. G2P0010 at [redacted]w[redacted]d by LMP c/w 6 wk [place] being seen today for her first obstetrical visit.  Her obstetrical history is significant for  bulimia, ADD, Tourette's, POTS . Patient  unsure if she  intends to breast feed. Pregnancy history fully reviewed.  Patient reports no complaints.  HISTORY: OB History  Gravida Para Term Preterm AB Living  2 0 0 0 1 0  SAB IAB Ectopic Multiple Live Births  1 0 0 0 0    # Outcome Date GA Lbr Len/2nd Weight Sex Delivery Anes PTL Lv  2 Current           1 SAB 04/26/13 [redacted]w[redacted]d            Birth Comments: [place]     Last pap smear: Lab Results  Component Value Date   DIAGPAP  06/09/2019    - Negative for Intraepithelial Lesions or Malignancy (NILM)   DIAGPAP - Benign reactive/reparative changes 06/09/2019   HPV NOT DETECTED 12/11/2016     Past Medical History:  Diagnosis Date   Anxiety    Asthma    as a child   Binge eating disorder    Dysrhythmia    " I feel it about once a month"   Frequent headaches    GAD (generalized anxiety disorder)    GERD (gastroesophageal reflux disease)    History of acute PID 06/09/2019   Major depression in complete remission (HCC)    Migraines    PID (acute pelvic inflammatory disease)    Seasonal allergies    Tourette disorder    Urinary tract bacterial infections    Past Surgical History:  Procedure Laterality Date   CHOLECYSTECTOMY     ESOPHAGOGASTRODUODENOSCOPY     LAPAROSCOPIC CHOLECYSTECTOMY SINGLE SITE WITH INTRAOPERATIVE CHOLANGIOGRAM N/A 03/15/2016   Procedure: LAPAROSCOPIC CHOLECYSTECTOMY SINGLE SITE WITH INTRAOPERATIVE CHOLANGIOGRAM;  Surgeon: Karie Soda, MD;  Location: [facility] OR;  Service: General;  Laterality: N/A;   TONSILLECTOMY AND ADENOIDECTOMY  2013   WISDOM TOOTH EXTRACTION     Family History  Problem Relation Age of Onset   Alcohol abuse Mother    Bipolar disorder Mother    Cancer Father        sarcoma; passed away when pt was 8    Asthma Sister    Allergic rhinitis Sister    Eczema Sister    Emphysema Maternal Grandmother    Tourette syndrome Maternal Grandfather    Emphysema Paternal Grandmother    Heart disease Paternal Grandfather    Tourette syndrome Maternal Aunt    Alcohol abuse Maternal Aunt    Alcohol abuse Maternal Uncle    Hyperlipidemia Paternal Aunt    Hashimoto's thyroiditis Paternal Aunt    Suicidality Neg Hx    Urticaria Neg Hx    Angioedema Neg Hx    Social History   Tobacco Use   Smoking status: Never   Smokeless tobacco: Never  Vaping Use   Vaping Use: Never used  Substance Use Topics   Alcohol use: Not Currently    Alcohol/week: 1.0 standard drink of alcohol    Types: 1 Glasses of wine per week    Comment: occ   Drug use: No   Allergies  Allergen Reactions   Latex Swelling and Rash   Barley Grass Hives   Cranberry Juice Powder Other (See Comments)    Throat pain.   Naproxen Other (See Comments)    "  Liver starts hurting" Diarrhea   Nickel     Skin peeling   Sulfa Antibiotics Hives and Swelling   Current Outpatient Medications on File Prior to Visit  Medication Sig Dispense Refill   cetirizine (ZYRTEC ALLERGY) 10 MG tablet Take 1 tablet (10 mg total) by mouth 2 (two) times daily. 60 tablet 3   clindamycin (CLEOCIN T) 1 % lotion Apply a small amount to skin once to twice daily as needed for flare 60 mL 5   Cyanocobalamin (VITAMIN B-12) 1000 MCG SUBL Place under the tongue.     diphenhydrAMINE (BENADRYL) 25 MG tablet Take 25 mg by mouth every 6 (six) hours as needed for allergies or sleep.     Doxylamine-Pyridoxine 10-10 MG TBEC Take 1/2 tab by mouth at bedtime as needed. 120 tablet 0   FOLIC ACID PO Take 1 tablet by mouth daily.     magnesium 30 MG tablet Take 30 mg by mouth 2 (two) times daily.     Prenatal Vit-Fe Fumarate-FA (PRENATAL MULTIVITAMIN) TABS tablet Take 1 tablet by mouth daily at 12 noon.     Vitamin D, Ergocalciferol, (DRISDOL) 1.25 MG (50000 UNIT) CAPS  capsule Take 1 capsule by mouth every 7 days. 12 capsule 0   No current facility-administered medications on file prior to visit.     Exam   Vitals:   11/02/21 0923  BP: 116/77  Pulse: 93  Weight: 201 lb 8 oz (91.4 kg)   Fetal Heart Rate (bpm): 158  System: General: well-developed, well-nourished female in no acute distress   Skin: normal coloration and turgor, no rashes   Neurologic: oriented, normal, negative, normal mood   Extremities: normal strength, tone, and muscle mass, ROM of all joints is normal   HEENT PERRLA, extraocular movement intact and sclera clear, anicteric   Neck supple and no masses   Respiratory:  no respiratory distress      Assessment:   Pregnancy: G2P0010 Patient Active Problem List   Diagnosis Date Noted   Supervision of other normal pregnancy, antepartum 10/11/2021   Encounter for preconception consultation 09/01/2021   History of asthma 09/23/2020   Urticaria 08/24/2020   Other adverse food reactions, not elsewhere classified, subsequent encounter 08/24/2020   Other allergic rhinitis 08/24/2020   B12 deficiency 07/01/2020   Exercise-induced asthma 10/27/2019   Overweight (BMI 25.0-29.9) 06/03/2018   Bulimia 04/20/2015   ADD (attention deficit disorder) 01/15/2014   Tourette disease 10/18/2013     Plan:  1. Supervision of other normal pregnancy, antepartum BP and FHR normal Up to date on Pap Initial labs reviewed. Continue prenatal vitamins. Genetic Screening discussed, NIPS: results reviewed. Ultrasound discussed; fetal anatomic survey: ordered. Problem list reviewed and updated. The nature of [facility] clinic was explained to patient; Voiced they may need to be seen by other [facility] providers which includes family medicine physicians, OB GYNs, and APPs. Delivery will hopefully be with one of the [facility] providers or another [facility] physician and we cannot promise this at this time.  Discussed there are [facility] staff in the  hospital 24-7 and they understand and support this model and there is a likelihood one of these providers will catch their baby.  We also discussed that the service includes learners (residents, student) and they will be involved in the care team.   2. GAD Follows regularly with [facility]  Routine obstetric precautions reviewed. Return in 4 weeks (on 11/30/2021) for [facility] patient, ob visit.

## 2021-11-03 LAB — GC/CHLAMYDIA PROBE AMP (~~LOC~~) NOT AT ARMC
Chlamydia: NEGATIVE
Comment: NEGATIVE
Comment: NORMAL
Neisseria Gonorrhea: NEGATIVE

## 2021-11-04 LAB — CULTURE, OB URINE

## 2021-11-04 LAB — URINE CULTURE, OB REFLEX: Organism ID, Bacteria: NO GROWTH

## 2021-11-07 ENCOUNTER — Ambulatory Visit (INDEPENDENT_AMBULATORY_CARE_PROVIDER_SITE_OTHER): Payer: No Typology Code available for payment source | Admitting: Licensed Clinical Social Worker

## 2021-11-07 ENCOUNTER — Encounter (HOSPITAL_COMMUNITY): Payer: Self-pay | Admitting: Licensed Clinical Social Worker

## 2021-11-07 DIAGNOSIS — F411 Generalized anxiety disorder: Secondary | ICD-10-CM | POA: Diagnosis not present

## 2021-11-07 NOTE — Progress Notes (Signed)
Virtual Visit via Video Note  I connected with Cindy Jones on 11/07/21 at 1:00pm EST by a video enabled telemedicine application and verified that I am speaking with the correct person using two identifiers.   I discussed the limitations of evaluation and management by telemedicine and the availability of in person appointments. The patient expressed understanding and agreed to proceed.  LOCATION: Patient: Home Provider: Home Office  History of Present Illness: Pt was referred to therapy by her psychiatrist at [facility] for anxiety and panic attacks.   Participation Level: Active   Type of Therapy: Virtual Video individual therapy   Treatment Goal Addressed: Pt will verbalize an understanding of how thoughts, physical feelings, and behavioral actions contribute to anxiety AEB self report  Interventions: CBT/Supportive/Psychoeducation  Summary: Patient presented for today's session sick with a cold/sinus and morning sickness on time and was alert, oriented x5, with no evidence or self-report of SI/HI or A/V H. Clinician inquired about patient's current emotional ratings, as well as any significant changes in thoughts, feelings or behavior since previous session. Patient's ratings 1/10 for depression, 3/10 for anxiety, 2/10 for anger/irritability, and denied any panic attacks since last session. Cln and pt explored emotional ratings and coping skills. Clinician utilized CBT to process pregnancy concerns and work challenges. Clinician processed thoughts, feelings, and behaviors. Clinician discussed coping skills and options for supporting herself through these challenging times. Cln provided an update on family, work, stressors, health, pregnancy journey, continued morning sickness.  Pt had another family therapy with her mother and her therapist at [facility]. Cln used CBT to help the pt process feelings from the session and began a list of questions for next session.  PLAN: screenings   PLAN: therapy  and anxiety notebooks, takeaways, goals   Collaboration of Care: Other: None at this session.  Patient/Guardian was advised Release of Information must be obtained prior to any record release in order to collaborate their care with an outside provider. Patient/Guardian was advised if they have not already done so to contact the registration department to sign all necessary forms in order for [place] to release information regarding their care.   Consent: Patient/Guardian gives verbal consent for treatment and assignment of benefits for services provided during this visit. Patient/Guardian expressed understanding and agreed to proceed.      Assessment and plan: Counselor will continue to meet with patient to address treatment plan goals. Patient will continue to follow recommendations of providers and implement skills learned in session.  Suicidal/Homicidal: Nowithout intent/plan  Therapist Response: Assessed pt's current functioning and reviewed progress.. Assisted pt processing stressors, negative thoughts.  Assisted pt processing for the management of her stressors.  Participation Level: Active  Diagnosis: Axis I.:  GAD    Follow Up Instructions: I discussed the assessment and treatment plan with the patient. The patient was provided an opportunity to ask questions and all were answered. The patient agreed with the plan and demonstrated an understanding of the instructions.   The patient was advised to call back or seek an in-person evaluation if the symptoms worsen or if the condition fails to improve as anticipated.  I provided 60 minutes of non-face-to-face time during this encounter.   Toshiko Kemler S, LCAS

## 2021-11-09 ENCOUNTER — Encounter: Payer: No Typology Code available for payment source | Admitting: Physical Therapy

## 2021-11-14 ENCOUNTER — Ambulatory Visit (INDEPENDENT_AMBULATORY_CARE_PROVIDER_SITE_OTHER): Payer: No Typology Code available for payment source | Admitting: Licensed Clinical Social Worker

## 2021-11-14 ENCOUNTER — Encounter (HOSPITAL_COMMUNITY): Payer: Self-pay | Admitting: Licensed Clinical Social Worker

## 2021-11-14 DIAGNOSIS — F411 Generalized anxiety disorder: Secondary | ICD-10-CM | POA: Diagnosis not present

## 2021-11-14 NOTE — Progress Notes (Signed)
Virtual Visit via Video Note  I connected with Cindy Jones on 11/14/21 at 1:00pm EST by a video enabled telemedicine application and verified that I am speaking with the correct person using two identifiers.   I discussed the limitations of evaluation and management by telemedicine and the availability of in person appointments. The patient expressed understanding and agreed to proceed.  LOCATION: Patient: Home Provider: Home Office  History of Present Illness: Pt was referred to therapy by her psychiatrist at [facility] for anxiety and panic attacks.   Participation Level: Active   Type of Therapy: Virtual Video individual therapy   Treatment Goal Addressed: Pt will verbalize an understanding of how thoughts, physical feelings, and behavioral actions contribute to anxiety AEB self report  Interventions: CBT/Supportive/Psychoeducation  Summary: Patient presented for today's session sick with a cold/sinus and morning sickness on time and was alert, oriented x5, with no evidence or self-report of SI/HI or A/V H. Clinician inquired about patient's current emotional ratings, as well as any significant changes in thoughts, feelings or behavior since previous session. Patient's ratings 1/10 for depression, 2/10 for anxiety, 2/10 for anger/irritability, and denied any panic attacks since last session. Cln and pt explored emotional ratings and coping skills. Clinician utilized CBT to process pregnancy concerns and work challenges. Clinician processed thoughts, feelings, and behaviors. Clinician discussed coping skills and options for supporting herself through these challenging times. Cln provided an update on family, work, stressors, health, pregnancy journey.  Pt heard from her mother by text within the last week. Cln asked open-ended questions.Cln used CBT to help the pt process the correspondence from her mother.   PLAN: screenings   PLAN: therapy and anxiety notebooks, takeaways,  goals   Collaboration of Care: Other: None at this session.  Patient/Guardian was advised Release of Information must be obtained prior to any record release in order to collaborate their care with an outside provider. Patient/Guardian was advised if they have not already done so to contact the registration department to sign all necessary forms in order for [place] to release information regarding their care.   Consent: Patient/Guardian gives verbal consent for treatment and assignment of benefits for services provided during this visit. Patient/Guardian expressed understanding and agreed to proceed.      Assessment and plan: Counselor will continue to meet with patient to address treatment plan goals. Patient will continue to follow recommendations of providers and implement skills learned in session.  Suicidal/Homicidal: Nowithout intent/plan  Therapist Response: Assessed pt's current functioning and reviewed progress.. Assisted pt processing stressors, negative thoughts.  Assisted pt processing for the management of her stressors.  Participation Level: Active  Diagnosis: Axis I.:  GAD    Follow Up Instructions: I discussed the assessment and treatment plan with the patient. The patient was provided an opportunity to ask questions and all were answered. The patient agreed with the plan and demonstrated an understanding of the instructions.   The patient was advised to call back or seek an in-person evaluation if the symptoms worsen or if the condition fails to improve as anticipated.  I provided 60 minutes of non-face-to-face time during this encounter.   Lashaundra Lehrmann S, LCAS

## 2021-11-15 ENCOUNTER — Ambulatory Visit: Payer: No Typology Code available for payment source | Admitting: Cardiology

## 2021-11-15 ENCOUNTER — Ambulatory Visit (INDEPENDENT_AMBULATORY_CARE_PROVIDER_SITE_OTHER): Payer: No Typology Code available for payment source

## 2021-11-15 ENCOUNTER — Encounter: Payer: Self-pay | Admitting: Cardiology

## 2021-11-15 VITALS — BP 110/64 | HR 80 | Ht 66.0 in | Wt 206.6 lb

## 2021-11-15 DIAGNOSIS — R42 Dizziness and giddiness: Secondary | ICD-10-CM | POA: Diagnosis not present

## 2021-11-15 DIAGNOSIS — R55 Syncope and collapse: Secondary | ICD-10-CM | POA: Diagnosis not present

## 2021-11-15 NOTE — Progress Notes (Unsigned)
Enrolled for [employer] to mail a ZIO XT long term holter monitor to the patients address on file.  

## 2021-11-15 NOTE — Progress Notes (Signed)
Cardio-Obstetrics Clinic  New Evaluation  Date:  11/15/2021   ID:  NEESHA LANGTON, DOB [birth year], MRN [id number]  PCP:  Reva Bores, MD   [facility] Providers Cardiologist:  Thomasene Ripple, DO  Electrophysiologist:  None       Referring MD: Natalia Leatherwood, DO   Chief Complaint: " I am ok"  History of Present Illness:    Cindy Jones is a 31 y.o. female [G2P0010] who is being seen today for the evaluation of lightheadedness at the request of Kuneff, Renee A, DO.  Medical history includes previous recurrent syncope which occurred mainly in February 2022, vitamin D deficiency suspected postural orthostatic tachycardia syndrome here today to be evaluated in the cardiac status clinic due to significant lightheadedness.   Prior CV Studies Reviewed: The following studies were reviewed today:  TTE 07/29/2020  1. Prominent apical and lateral LV wall trabeculations . Left ventricular ejection fraction, by estimation, is 60 to 65%. The left ventricle has  normal function. The left ventricle has no regional wall motion abnormalities. Left ventricular diastolic  parameters were normal.   2. Right ventricular systolic function is normal. The right ventricular size is normal.   3. The mitral valve is normal in structure. Trivial mitral valve regurgitation. No evidence of mitral stenosis.   4. The aortic valve is tricuspid. Aortic valve regurgitation is trivial. No aortic stenosis is present.   5. The inferior vena cava is normal in size with greater than 50% respiratory variability, suggesting right atrial pressure of 3 mmHg.    Zio 07/28/2020 Impression: 1. No arrhythmias detected.  2. Rare ectopy.     Past Medical History:  Diagnosis Date   Anxiety    Asthma    as a child   Binge eating disorder    Dysrhythmia    " I feel it about once a month"   Frequent headaches    GAD (generalized anxiety disorder)    GERD (gastroesophageal reflux disease)    History of acute PID  06/09/2019   Major depression in complete remission (HCC)    Migraines    PID (acute pelvic inflammatory disease)    Seasonal allergies    Tourette disorder    Urinary tract bacterial infections     Past Surgical History:  Procedure Laterality Date   CHOLECYSTECTOMY     ESOPHAGOGASTRODUODENOSCOPY     LAPAROSCOPIC CHOLECYSTECTOMY SINGLE SITE WITH INTRAOPERATIVE CHOLANGIOGRAM N/A 03/15/2016   Procedure: LAPAROSCOPIC CHOLECYSTECTOMY SINGLE SITE WITH INTRAOPERATIVE CHOLANGIOGRAM;  Surgeon: Karie Soda, MD;  Location: [facility] OR;  Service: General;  Laterality: N/A;   TONSILLECTOMY AND ADENOIDECTOMY  2013   WISDOM TOOTH EXTRACTION        OB History     Gravida  2   Para  0   Term  0   Preterm  0   AB  1   Living  0      SAB  1   IAB  0   Ectopic  0   Multiple  0   Live Births                  Current Medications: Current Meds  Medication Sig   cetirizine (ZYRTEC ALLERGY) 10 MG tablet Take 1 tablet (10 mg total) by mouth 2 (two) times daily.   Cyanocobalamin (VITAMIN B-12) 1000 MCG SUBL Place under the tongue.   diphenhydrAMINE (BENADRYL) 25 MG tablet Take 25 mg by mouth every 6 (six) hours as needed for allergies or  sleep.   Doxylamine-Pyridoxine 10-10 MG TBEC Take 1/2 tab by mouth at bedtime as needed.   FOLIC ACID PO Take 1 tablet by mouth daily.   magnesium 30 MG tablet Take 30 mg by mouth 2 (two) times daily.   Prenatal Vit-Fe Fumarate-FA (PRENATAL MULTIVITAMIN) TABS tablet Take 1 tablet by mouth daily at 12 noon.     Allergies:   Latex, Barley grass, Cranberry juice powder, Naproxen, Nickel, and Sulfa antibiotics   Social History   Socioeconomic History   Marital status: Married    Spouse name: Not on file   Number of children: 0   Years of education: Not on file   Highest education level: Not on file  Occupational History   Occupation: Systems developeranalyst porfolio  Tobacco Use   Smoking status: Never   Smokeless tobacco: Never  Vaping Use   Vaping Use:  Never used  Substance and Sexual Activity   Alcohol use: Not Currently    Alcohol/week: 1.0 standard drink of alcohol    Types: 1 Glasses of wine per week    Comment: occ   Drug use: No   Sexual activity: Yes    Partners: Male    Birth control/protection: None    Comment: Married  Other Topics Concern   Not on file  Social History Narrative   - Married, no children.   - Automotive engineerCollege education.   - She works FT as a Patent attorneyloan broker.   - Her aunt & uncle were her guardians when she was in HS. [place], [place]    - She has a fraternal twin sister.    - Wears her seatbelt, smoke detectors in the home.   Right handed   Social Determinants of Health   Financial Resource Strain: Not on file  Food Insecurity: No Food Insecurity (11/02/2021)   Hunger Vital Sign    Worried About Running Out of Food in the Last Year: Never true    Ran Out of Food in the Last Year: Never true  Transportation Needs: No Transportation Needs (11/02/2021)   PRAPARE - Administrator, Civil ServiceTransportation    Lack of Transportation (Medical): No    Lack of Transportation (Non-Medical): No  Physical Activity: Not on file  Stress: Not on file  Social Connections: Not on file      Family History  Problem Relation Age of Onset   Alcohol abuse Mother    Bipolar disorder Mother    Cancer Father        sarcoma; passed away when pt was 8   Asthma Sister    Allergic rhinitis Sister    Eczema Sister    Emphysema Maternal Grandmother    Tourette syndrome Maternal Grandfather    Emphysema Paternal Grandmother    Heart disease Paternal Grandfather    Tourette syndrome Maternal Aunt    Alcohol abuse Maternal Aunt    Alcohol abuse Maternal Uncle    Hyperlipidemia Paternal Aunt    Hashimoto's thyroiditis Paternal Aunt    Suicidality Neg Hx    Urticaria Neg Hx    Angioedema Neg Hx       ROS:   Review of Systems  Constitution: Reports lightheadedness and dizziness.  Negative for decreased appetite, fever and weight gain.   HENT: Negative for congestion, ear discharge, hoarse voice and sore throat.   Eyes: Negative for discharge, redness, vision loss in right eye and visual halos.  Cardiovascular: Negative for chest pain, dyspnea on exertion, leg swelling, orthopnea and palpitations.  Respiratory:  Negative for cough, hemoptysis, shortness of breath and snoring.   Endocrine: Negative for heat intolerance and polyphagia.  Hematologic/Lymphatic: Negative for bleeding problem. Does not bruise/bleed easily.  Skin: Negative for flushing, nail changes, rash and suspicious lesions.  Musculoskeletal: Negative for arthritis, joint pain, muscle cramps, myalgias, neck pain and stiffness.  Gastrointestinal: Negative for abdominal pain, bowel incontinence, diarrhea and excessive appetite.  Genitourinary: Negative for decreased libido, genital sores and incomplete emptying.  Neurological: Negative for brief paralysis, focal weakness, headaches and loss of balance.  Psychiatric/Behavioral: Negative for altered mental status, depression and suicidal ideas.  Allergic/Immunologic: Negative for HIV exposure and persistent infections.    Labs/EKG Reviewed:    EKG:   EKG is was ordered today.  The ekg ordered today demonstrates sinus rhythm heart rate 80 bpm with sinus arrhythmia.    TTE 07/29/2020  1. Prominent apical and lateral LV wall trabeculations . Left ventricular  ejection fraction, by estimation, is 60 to 65%. The left ventricle has  normal function. The left ventricle has no regional wall motion  abnormalities. Left ventricular diastolic  parameters were normal.   2. Right ventricular systolic function is normal. The right ventricular  size is normal.   3. The mitral valve is normal in structure. Trivial mitral valve  regurgitation. No evidence of mitral stenosis.   4. The aortic valve is tricuspid. Aortic valve regurgitation is trivial.  No aortic stenosis is present.   5. The inferior vena cava is normal in  size with greater than 50%  respiratory variability, suggesting right atrial pressure of 3 mmHg.    Zio 07/28/2020 Impression: 1. No arrhythmias detected.  2. Rare ectopy.     Recent Labs: 10/15/2021: ALT 15; BUN 7; Creatinine, Ser 0.57; Potassium 4.2; Sodium 138 10/21/2021: Hemoglobin 11.4; Platelets 174   Recent Lipid Panel Lab Results  Component Value Date/Time   CHOL 124 10/22/2020 10:36 AM   TRIG 51.0 10/22/2020 10:36 AM   HDL 48.60 10/22/2020 10:36 AM   CHOLHDL 3 10/22/2020 10:36 AM   LDLCALC 65 10/22/2020 10:36 AM   LDLCALC 48 10/27/2019 01:57 PM   LDLDIRECT 53.0 07/14/2016 08:33 AM    Physical Exam:    VS:  BP 110/64 (BP Location: Right Arm, Patient Position: Sitting, Cuff Size: Normal)   Pulse 80   Ht 5\' 6"  (1.676 m)   Wt 206 lb 9.6 oz (93.7 kg)   LMP 08/09/2021 (Exact Date)   SpO2 99%   BMI 33.35 kg/m     Wt Readings from Last 3 Encounters:  11/15/21 206 lb 9.6 oz (93.7 kg)  11/02/21 201 lb 8 oz (91.4 kg)  10/15/21 199 lb 12.8 oz (90.6 kg)     GEN:  Well nourished, well developed in no acute distress HEENT: Normal NECK: No JVD; No carotid bruits LYMPHATICS: No lymphadenopathy CARDIAC: RRR, no murmurs, rubs, gallops RESPIRATORY:  Clear to auscultation without rales, wheezing or rhonchi  ABDOMEN: Soft, non-tender, non-distended MUSCULOSKELETAL:  No edema; No deformity  SKIN: Warm and dry NEUROLOGIC:  Alert and oriented x 3 PSYCHIATRIC:  Normal affect    Risk Assessment/Risk Calculators:                 ASSESSMENT & PLAN:       Her symptoms are concerning, especially given her history of suspected postural orthostatic tachycardia syndrome I like to place another monitor on the patient to understand her heart rate along with her symptoms.  I reviewed her echocardiogram which was done in 2022  the need to repeat this.  We had a lot of counseling about some none pharmacological treatment including increasing her fluid intake, using support  stockings.  Her husband is also in the room.  We talked about the risk of preeclampsia she is some questions.  I was able to explain to the patient. She is a bit anxious about all all of the symptoms that is associated with her right knee and especially in her pregnant state.  We were able to talk about this.  I think her visit helped the patient anxiety and hopefully we can be able to continue to monitor during this pregnancy.  I discussed with her should she have significant heart rate in more symptoms likely we will consider low-dose propanolol.  Patient Instructions  Medication Instructions:  Your physician recommends that you continue on your current medications as directed. Please refer to the Current Medication list given to you today.  *If you need a refill on your cardiac medications before your next appointment, please call your pharmacy*   Lab Work: None If you have labs (blood work) drawn today and your tests are completely normal, you will receive your results only by: MyChart Message (if you have MyChart) OR A paper copy in the mail If you have any lab test that is abnormal or we need to change your treatment, we will call you to review the results.   Testing/Procedures: Christena Deem- Long Term Monitor Instructions  Your physician has requested you wear a ZIO patch monitor for 7 days.  This is a single patch monitor. [employer] supplies one patch monitor per enrollment. Additional stickers are not available. Please do not apply patch if you will be having a Nuclear Stress Test,  Echocardiogram, Cardiac CT, MRI, or Chest Xray during the period you would be wearing the  monitor. The patch cannot be worn during these tests. You cannot remove and re-apply the  ZIO XT patch monitor.  Your ZIO patch monitor will be mailed 3 day USPS to your address on file. It may take 3-5 days  to receive your monitor after you have been enrolled.  Once you have received your monitor, please review  the enclosed instructions. Your monitor  has already been registered assigning a specific monitor serial # to you.  Billing and Patient Assistance Program Information  We have supplied [employer] with any of your insurance information on file for billing purposes. [employer] offers a sliding scale Patient Assistance Program for patients that do not have  insurance, or whose insurance does not completely cover the cost of the ZIO monitor.  You must apply for the Patient Assistance Program to qualify for this discounted rate.  To apply, please call [employer] at [phone], select option 4, select option 2, ask to apply for  Patient Assistance Program. Meredeth Ide will ask your household income, and how many people  are in your household. They will quote your out-of-pocket cost based on that information.  [employer] will also be able to set up a 21-month, interest-free payment plan if needed.  Applying the monitor   Shave hair from upper left chest.  Hold abrader disc by orange tab. Rub abrader in 40 strokes over the upper left chest as  indicated in your monitor instructions.  Clean area with 4 enclosed alcohol pads. Let dry.  Apply patch as indicated in monitor instructions. Patch will be placed under collarbone on left  side of chest with arrow pointing upward.  Rub patch adhesive wings for 2 minutes.  Remove white label marked "1". Remove the white  label marked "2". Rub patch adhesive wings for 2 additional minutes.  While looking in a mirror, press and release button in center of patch. A small green light will  flash 3-4 times. This will be your only indicator that the monitor has been turned on.  Do not shower for the first 24 hours. You may shower after the first 24 hours.  Press the button if you feel a symptom. You will hear a small click. Record Date, Time and  Symptom in the Patient Logbook.  When you are ready to remove the patch, follow instructions on the last 2 pages of Patient   Logbook. Stick patch monitor onto the last page of Patient Logbook.  Place Patient Logbook in the blue and white box. Use locking tab on box and tape box closed  securely. The blue and white box has prepaid postage on it. Please place it in the mailbox as  soon as possible. Your physician should have your test results approximately 7 days after the  monitor has been mailed back to [facility].  Call [facility] Customer Care at [phone] if you have questions regarding  your ZIO XT patch monitor. Call them immediately if you see an orange light blinking on your  monitor.  If your monitor falls off in less than 4 days, contact our Monitor department at [phone].  If your monitor becomes loose or falls off after 4 days call [employer] at [phone] for  suggestions on securing your monitor    Follow-Up: At [facility], you and your health needs are our priority.  As part of our continuing mission to provide you with exceptional heart care, we have created designated Provider Care Teams.  These Care Teams include your primary Cardiologist (physician) and Advanced Practice Providers (APPs -  Physician Assistants and Nurse Practitioners) who all work together to provide you with the care you need, when you need it.  We recommend signing up for the patient portal called "MyChart".  Sign up information is provided on this After Visit Summary.  MyChart is used to connect with patients for Virtual Visits (Telemedicine).  Patients are able to view lab/test results, encounter notes, upcoming appointments, etc.  Non-urgent messages can be sent to your provider as well.   To learn more about what you can do with MyChart, go to [email or web address].    Your next appointment:   16 week(s)  The format for your next appointment:   In Person  Provider:   Thomasene Ripple, DO    Other Instructions   Important Information About Sugar         Dispo:  Return in about 16 weeks  (around 03/07/2022).   Medication Adjustments/Labs and Tests Ordered: Current medicines are reviewed at length with the patient today.  Concerns regarding medicines are outlined above.  Tests Ordered: Orders Placed This Encounter  Procedures   LONG TERM MONITOR (3-14 DAYS)   EKG 12-Lead   Medication Changes: No orders of the defined types were placed in this encounter.

## 2021-11-15 NOTE — Patient Instructions (Addendum)
Medication Instructions:  Your physician recommends that you continue on your current medications as directed. Please refer to the Current Medication list given to you today.  *If you need a refill on your cardiac medications before your next appointment, please call your pharmacy*   Lab Work: None If you have labs (blood work) drawn today and your tests are completely normal, you will receive your results only by: [place] (if you have MyChart) OR A paper copy in the mail If you have any lab test that is abnormal or we need to change your treatment, we will call you to review the results.   Testing/Procedures: [place]- Long Term Monitor Instructions  Your physician has requested you wear a ZIO patch monitor for 7 days.  This is a single patch monitor. [employer] supplies one patch monitor per enrollment. Additional stickers are not available. Please do not apply patch if you will be having a Nuclear Stress Test,  Echocardiogram, Cardiac CT, MRI, or Chest Xray during the period you would be wearing the  monitor. The patch cannot be worn during these tests. You cannot remove and re-apply the  ZIO XT patch monitor.  Your ZIO patch monitor will be mailed 3 day USPS to your address on file. It may take 3-5 days  to receive your monitor after you have been enrolled.  Once you have received your monitor, please review the enclosed instructions. Your monitor  has already been registered assigning a specific monitor serial # to you.  Billing and Patient Assistance Program Information  We have supplied [employer] with any of your insurance information on file for billing purposes. [employer] offers a sliding scale Patient Assistance Program for patients that do not have  insurance, or whose insurance does not completely cover the cost of the ZIO monitor.  You must apply for the Patient Assistance Program to qualify for this discounted rate.  To apply, please call [employer] at [phone], select  option 4, select option 2, ask to apply for  Patient Assistance Program. Theodore Demark will ask your household income, and how many people  are in your household. They will quote your out-of-pocket cost based on that information.  [employer] will also be able to set up a 22-month interest-free payment plan if needed.  Applying the monitor   Shave hair from upper left chest.  Hold abrader disc by orange tab. Rub abrader in 40 strokes over the upper left chest as  indicated in your monitor instructions.  Clean area with 4 enclosed alcohol pads. Let dry.  Apply patch as indicated in monitor instructions. Patch will be placed under collarbone on left  side of chest with arrow pointing upward.  Rub patch adhesive wings for 2 minutes. Remove white label marked "1". Remove the white  label marked "2". Rub patch adhesive wings for 2 additional minutes.  While looking in a mirror, press and release button in center of patch. A small green light will  flash 3-4 times. This will be your only indicator that the monitor has been turned on.  Do not shower for the first 24 hours. You may shower after the first 24 hours.  Press the button if you feel a symptom. You will hear a small click. Record Date, Time and  Symptom in the Patient Logbook.  When you are ready to remove the patch, follow instructions on the last 2 pages of Patient  Logbook. Stick patch monitor onto the last page of Patient Logbook.  Place Patient Logbook in  the blue and white box. Use locking tab on box and tape box closed  securely. The blue and white box has prepaid postage on it. Please place it in the mailbox as  soon as possible. Your physician should have your test results approximately 7 days after the  monitor has been mailed back to [facility].  Call [facility] Customer Care at [phone] if you have questions regarding  your ZIO XT patch monitor. Call them immediately if you see an orange light blinking on your  monitor.   If your monitor falls off in less than 4 days, contact our Monitor department at [phone].  If your monitor becomes loose or falls off after 4 days call [employer] at [phone] for  suggestions on securing your monitor    Follow-Up: At [facility], you and your health needs are our priority.  As part of our continuing mission to provide you with exceptional heart care, we have created designated Provider Care Teams.  These Care Teams include your primary Cardiologist (physician) and Advanced Practice Providers (APPs -  Physician Assistants and Nurse Practitioners) who all work together to provide you with the care you need, when you need it.  We recommend signing up for the patient portal called "MyChart".  Sign up information is provided on this After Visit Summary.  MyChart is used to connect with patients for Virtual Visits (Telemedicine).  Patients are able to view lab/test results, encounter notes, upcoming appointments, etc.  Non-urgent messages can be sent to your provider as well.   To learn more about what you can do with MyChart, go to [email or web address].    Your next appointment:   16 week(s)  The format for your next appointment:   In Person  Provider:   Thomasene Ripple, DO    Other Instructions   Important Information About Sugar

## 2021-11-21 ENCOUNTER — Ambulatory Visit (HOSPITAL_COMMUNITY): Payer: No Typology Code available for payment source | Admitting: Licensed Clinical Social Worker

## 2021-11-22 DIAGNOSIS — R42 Dizziness and giddiness: Secondary | ICD-10-CM

## 2021-11-22 DIAGNOSIS — R55 Syncope and collapse: Secondary | ICD-10-CM | POA: Diagnosis not present

## 2021-11-28 ENCOUNTER — Ambulatory Visit (HOSPITAL_COMMUNITY): Payer: No Typology Code available for payment source | Admitting: Licensed Clinical Social Worker

## 2021-11-29 ENCOUNTER — Encounter: Payer: Self-pay | Admitting: Family Medicine

## 2021-11-30 ENCOUNTER — Ambulatory Visit (INDEPENDENT_AMBULATORY_CARE_PROVIDER_SITE_OTHER): Payer: No Typology Code available for payment source | Admitting: Family Medicine

## 2021-11-30 ENCOUNTER — Encounter: Payer: Self-pay | Admitting: Family Medicine

## 2021-11-30 ENCOUNTER — Other Ambulatory Visit: Payer: Self-pay

## 2021-11-30 VITALS — BP 111/74 | HR 74 | Wt 207.1 lb

## 2021-11-30 DIAGNOSIS — Z3480 Encounter for supervision of other normal pregnancy, unspecified trimester: Secondary | ICD-10-CM

## 2021-11-30 DIAGNOSIS — F988 Other specified behavioral and emotional disorders with onset usually occurring in childhood and adolescence: Secondary | ICD-10-CM

## 2021-11-30 NOTE — Progress Notes (Signed)
   PRENATAL VISIT NOTE  Subjective:  Cindy Jones is a 31 y.o. G2P0010 at [redacted]w[redacted]d being seen today for ongoing prenatal care.  She is currently monitored for the following issues for this low-risk pregnancy and has Tourette disease; ADD (attention deficit disorder); Bulimia; Overweight (BMI 25.0-29.9); GAD (generalized anxiety disorder); Exercise-induced asthma; Urticaria; Other adverse food reactions, not elsewhere classified, subsequent encounter; Other allergic rhinitis; History of asthma; Encounter for preconception consultation; Supervision of other normal pregnancy, antepartum; and Major depressive disorder, single episode, in full remission (HCC) on their problem list.  Patient reports no complaints.  Contractions: Not present. Vag. Bleeding: None.  Movement: Present. Denies leaking of fluid.   The following portions of the patient's history were reviewed and updated as appropriate: allergies, current medications, past family history, past medical history, past social history, past surgical history and problem list.   Objective:   Vitals:   11/30/21 1127  BP: 111/74  Pulse: 74  Weight: 207 lb 1.6 oz (93.9 kg)    Fetal Status: Fetal Heart Rate (bpm): 150   Movement: Present     General:  Alert, oriented and cooperative. Patient is in no acute distress.  Skin: Skin is warm and dry. No rash noted.   Cardiovascular: Normal heart rate noted  Respiratory: Normal respiratory effort, no problems with respiration noted  Abdomen: Soft, gravid, appropriate for gestational age.  Pain/Pressure: Absent     Pelvic: Cervical exam deferred        Extremities: Normal range of motion.     Mental Status: Normal mood and affect. Normal behavior. Normal judgment and thought content.   Assessment and Plan:  Pregnancy: G2P0010 at [redacted]w[redacted]d 1. Supervision of other normal pregnancy, antepartum Doing well.  - AFP, Serum, Open Spina Bifida - RPR - Rubella Antibody, IgM  2. ADD - Patient was previously  on adderall and stopped for pregnancy. She is noting struggles with initiating tasks. She has a psychiatrist and therapist - Discussed relative lack of data about low level stimulant use in pregnancy. Reviewed that most studies were done on males or were those using methamphetamines and not low level consistently prescribed medications.   Preterm labor symptoms and general obstetric precautions including but not limited to vaginal bleeding, contractions, leaking of fluid and fetal movement were reviewed in detail with the patient. Please refer to After Visit Summary for other counseling recommendations.   No follow-ups on file.  Future Appointments  Date Time Provider Department Center  12/05/2021  1:00 PM Vernona Rieger, [place] [facility] None  12/08/2021 11:30 AM Oletta Darter, MD [facility] None  12/12/2021  1:00 PM Vernona Rieger, LCAS [facility] None  12/19/2021  1:00 PM Vernona Rieger, LCAS [facility] None  12/22/2021  9:30 AM [facility] NURSE [facility]  12/22/2021  9:45 AM [facility] US5 [facility]  12/26/2021  1:00 PM Vernona Rieger, LCAS [facility] None  12/29/2021  3:35 PM Federico Flake, MD [facility]  01/24/2022  1:35 PM Venora Maples, MD [facility]  02/22/2022  8:20 AM [facility] LAB [facility]  02/22/2022  8:35 AM Venora Maples, MD [facility]  03/07/2022  9:00 AM Thomasene Ripple, DO [facility]    Federico Flake, MD

## 2021-12-01 ENCOUNTER — Encounter: Payer: Self-pay | Admitting: Family Medicine

## 2021-12-01 ENCOUNTER — Encounter: Payer: No Typology Code available for payment source | Admitting: Family Medicine

## 2021-12-05 ENCOUNTER — Ambulatory Visit (INDEPENDENT_AMBULATORY_CARE_PROVIDER_SITE_OTHER): Payer: No Typology Code available for payment source | Admitting: Licensed Clinical Social Worker

## 2021-12-05 ENCOUNTER — Encounter (HOSPITAL_COMMUNITY): Payer: Self-pay | Admitting: Licensed Clinical Social Worker

## 2021-12-05 DIAGNOSIS — F411 Generalized anxiety disorder: Secondary | ICD-10-CM

## 2021-12-05 NOTE — Progress Notes (Signed)
Virtual Visit via Video Note  I connected with Cindy Jones on 12/05/21 at 1:00pm EST by a video enabled telemedicine application and verified that I am speaking with the correct person using two identifiers.   I discussed the limitations of evaluation and management by telemedicine and the availability of in person appointments. The patient expressed understanding and agreed to proceed.  LOCATION: Patient: Home Provider: Home Office  History of Present Illness: Pt was referred to therapy by her psychiatrist at [facility] for anxiety and panic attacks.   Participation Level: Active   Type of Therapy: Virtual Video individual therapy   Treatment Goal Addressed: Pt will verbalize an understanding of how thoughts, physical feelings, and behavioral actions contribute to anxiety AEB self report  Interventions: CBT/Supportive/Psychoeducation  Summary: Patient presented for today'Jones session sick with a cold/sinus and morning sickness on time and was alert, oriented x5, with no evidence or self-report of SI/HI or A/V H. Clinician inquired about patient'Jones current emotional ratings, as well as any significant changes in thoughts, feelings or behavior since previous session. Patient'Jones ratings 3/10 for depression, 3/10 for anxiety, 2/10 for anger/irritability, and denied any panic attacks since last session. Cln and pt explored emotional ratings and coping skills. Clinician utilized CBT to process pregnancy concerns and work challenges. Clinician processed thoughts, feelings, and behaviors. Clinician discussed coping skills and options for supporting herself through these challenging times. Cln provided an update on family, work, stressors, health, pregnancy journey. Pt was tearful in describing her feelings about her lack of motivation, lack of completion of work product, always feeling tired, unable to complete tasks around the house. [name] husband joined the session where he spoke to his wife assuring her and  helping her with self image. Cln and pt explored pt returning to 10mg  of Adderall to assist with symptoms (ADD and Tourettes) and will discuss it with Dr. Thursday at her appt.     PLAN: screenings   PLAN: therapy and anxiety notebooks, takeaways, goals   Collaboration of Care: Other: Continue to work with psychiatrist for medication management  Patient/Guardian was advised Release of Information must be obtained prior to any record release in order to collaborate their care with an outside provider. Patient/Guardian was advised if they have not already done so to contact the registration department to sign all necessary forms in order for Thursday to release information regarding their care.   Consent: Patient/Guardian gives verbal consent for treatment and assignment of benefits for services provided during this visit. Patient/Guardian expressed understanding and agreed to proceed.      Assessment and plan: Counselor will continue to meet with patient to address treatment plan goals. Patient will continue to follow recommendations of providers and implement skills learned in session.  Suicidal/Homicidal: Nowithout intent/plan  Therapist Response: Assessed [name] current functioning and reviewed progress.. Assisted pt processing stressors, negative thoughts.  Assisted pt processing for the management of her stressors.  Participation Level: Active  Diagnosis: Axis I.:  GAD    Follow Up Instructions: I discussed the assessment and treatment plan with the patient. The patient was provided an opportunity to ask questions and all were answered. The patient agreed with the plan and demonstrated an understanding of the instructions.   The patient was advised to call back or seek an in-person evaluation if the symptoms worsen or if the condition fails to improve as anticipated.  I provided 60 minutes of non-face-to-face time during this encounter.   Cindy Jones,  LCAS

## 2021-12-06 LAB — AFP, SERUM, OPEN SPINA BIFIDA
AFP MoM: 1.43
AFP Value: 41 ng/mL
Gest. Age on Collection Date: 16.1 weeks
Maternal Age At EDD: 31.4 yr
OSBR Risk 1 IN: 3311
Test Results:: NEGATIVE
Weight: 207 [lb_av]

## 2021-12-06 LAB — RUBELLA ANTIBODY, IGM: Rubella IgM: <20 AU/mL (ref 0.0–19.9)

## 2021-12-06 LAB — RPR: RPR Ser Ql: NONREACTIVE

## 2021-12-07 ENCOUNTER — Encounter: Payer: Self-pay | Admitting: Family Medicine

## 2021-12-07 DIAGNOSIS — Z2839 Other underimmunization status: Secondary | ICD-10-CM | POA: Insufficient documentation

## 2021-12-08 ENCOUNTER — Telehealth (HOSPITAL_BASED_OUTPATIENT_CLINIC_OR_DEPARTMENT_OTHER): Payer: No Typology Code available for payment source | Admitting: Psychiatry

## 2021-12-08 ENCOUNTER — Ambulatory Visit: Payer: No Typology Code available for payment source | Admitting: Podiatry

## 2021-12-08 DIAGNOSIS — F410 Panic disorder [episodic paroxysmal anxiety] without agoraphobia: Secondary | ICD-10-CM

## 2021-12-08 DIAGNOSIS — F411 Generalized anxiety disorder: Secondary | ICD-10-CM

## 2021-12-08 DIAGNOSIS — F952 Tourette's disorder: Secondary | ICD-10-CM

## 2021-12-08 DIAGNOSIS — F988 Other specified behavioral and emotional disorders with onset usually occurring in childhood and adolescence: Secondary | ICD-10-CM

## 2021-12-08 NOTE — Progress Notes (Signed)
Virtual Visit via Video Note  I connected with Cindy Jones on 12/08/21 at 11:30 AM EDT by   a video enabled telemedicine application and verified that I am speaking with the correct person using two identifiers.  Location: Patient: home Provider: office   I discussed the limitations of evaluation and management by telemedicine and the availability of in person appointments. The patient expressed understanding and agreed to proceed.  History of Present Illness: Cindy Jones is currently in her second trimester and the pregnancy is going well. She is deal with all the physical fatigue and nausea. She is not motivated to do things like before. 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 Cindy Jones feels slow and "not sharp". She is not sure if she is depressed. She is excited to have the baby and do things. She denies anhedonia and isolation. She denies SI/HI. She is having more chest tightening tics everyday. It happens in her eyes but it is not as bad as she thought it would be. She has had 1 stress induced panic attack recently. Cindy Jones is eating well. She does not want to know what her weight is and trying to not focus on it. She is not binging or restricting food. Cindy Jones continues to engage in therapy.    Observations/Objective: Psychiatric Specialty Exam: ROS  Last menstrual period 08/09/2021.There is no height or weight on file to calculate BMI.  General Appearance: Casual and Neat  Eye Contact:  Good  Speech:  Clear and Coherent and Normal Rate  Volume:  Normal  Mood:  Euthymic  Affect:  Full Range  Thought Process:  Goal Directed, Linear, and Descriptions of Associations: Intact  Orientation:  Full (Time, Place, and Person)  Thought Content:  Logical  Suicidal Thoughts:  No  Homicidal Thoughts:  No  Memory:  Immediate;   Good  Judgement:  Good  Insight:  Good  Psychomotor Activity:  Normal  Concentration:  Concentration: Good  Recall:  Good  Fund of Knowledge:  Good   Language:  Good  Akathisia:  No  Handed:  Right  AIMS (if indicated):     Assets:  Communication Skills Desire for Improvement Financial Resources/Insurance Housing Intimacy Leisure Time Physical Health Resilience Social Support Talents/Skills Transportation Vocational/Educational  ADL's:  Intact  Cognition:  WNL  Sleep:        Assessment and Plan:     11/30/2021   11:42 AM 11/02/2021   10:17 AM 10/06/2021   10:49 AM 09/01/2021   10:23 AM 08/18/2021   10:14 AM  Depression screen PHQ 2/9  Decreased Interest 1 0 0 0 0  Down, Depressed, Hopeless 1 0 0 0 0  PHQ - 2 Score 2 0 0 0 0  Altered sleeping 1 1  0   Tired, decreased energy 2 1  1    Change in appetite 0 0  0   Feeling bad or failure about yourself  1 1  0   Trouble concentrating 1 1  3    Moving slowly or fidgety/restless 0 0  3   Suicidal thoughts 0 0  0   PHQ-9 Score 7 4  7    Difficult doing work/chores Not difficult at all Not difficult at all       Flowsheet Row Admission (Discharged) from 10/15/2021 in [facility] 1S Maternity Assessment Unit Video Visit from [facility]/06/2021 in [facility] Video Visit from 08/18/2021 in [facility]  C-SSRS RISK CATEGORY No Risk No Risk  No Risk          Status of current problems: ongoing ADHD symptoms. Currently in 2nd trimester and is working with her OB regularly. She is due to January 2023.   Meds:  1. ADD (attention deficit disorder) without hyperactivity  2. Tourette disease  3. Generalized anxiety disorder  4. Panic attacks     Labs: none    Therapy: brief supportive therapy provided. Discussed psychosocial stressors in detail.     Collaboration of Care: Other none  Patient/Guardian was advised Release of Information must be obtained prior to any record release in order to collaborate their care with an outside provider. Patient/Guardian was advised if they have not already done so to  contact the registration department to sign all necessary forms in order for [place] to release information regarding their care.   Consent: Patient/Guardian gives verbal consent for treatment and assignment of benefits for services provided during this visit. Patient/Guardian expressed understanding and agreed to proceed.   Follow Up Instructions: Follow up in 2-3 months or sooner if needed    I discussed the assessment and treatment plan with the patient. The patient was provided an opportunity to ask questions and all were answered. The patient agreed with the plan and demonstrated an understanding of the instructions.   The patient was advised to call back or seek an in-person evaluation if the symptoms worsen or if the condition fails to improve as anticipated.  I provided 22 minutes of non-face-to-face time during this encounter.   Oletta Darter, MD

## 2021-12-12 ENCOUNTER — Ambulatory Visit (HOSPITAL_COMMUNITY): Payer: No Typology Code available for payment source | Admitting: Licensed Clinical Social Worker

## 2021-12-12 ENCOUNTER — Encounter: Payer: Self-pay | Admitting: Family Medicine

## 2021-12-15 ENCOUNTER — Encounter: Payer: Self-pay | Admitting: Podiatry

## 2021-12-15 ENCOUNTER — Ambulatory Visit: Payer: No Typology Code available for payment source | Admitting: Podiatry

## 2021-12-15 DIAGNOSIS — L600 Ingrowing nail: Secondary | ICD-10-CM

## 2021-12-15 NOTE — Patient Instructions (Signed)

## 2021-12-15 NOTE — Progress Notes (Signed)
  Subjective:  Patient ID: Cindy Jones, female    DOB: 06-06-1990,   MRN: [id number]  Chief Complaint  Patient presents with   Ingrown Toenail    R great toe, ingrown toe    31 y.o. female presents for concern of right great ingrown toenail that has been a chronic problem but has worsened as she has been pregnant. Has been able to treat the inside border but the outside has remained a problem and been very tender swollen and draining. She is [redacted] weeks pregnant.  . Denies any other pedal complaints. Denies n/v/f/c.   Past Medical History:  Diagnosis Date   Anxiety    Asthma    as a child   Binge eating disorder    Dysrhythmia    " I feel it about once a month"   Frequent headaches    GAD (generalized anxiety disorder)    GERD (gastroesophageal reflux disease)    History of acute PID 06/09/2019   Major depression in complete remission (HCC)    Migraines    PID (acute pelvic inflammatory disease)    Seasonal allergies    Tourette disorder    Urinary tract bacterial infections     Objective:  Physical Exam: Vascular: DP/PT pulses 2/4 bilateral. CFT <3 seconds. Normal hair growth on digits. No edema.  Skin. No lacerations or abrasions bilateral feet. Right hallux medial border incurvated with erythema and edema and some drainage note. No purulence.  Musculoskeletal: MMT 5/5 bilateral lower extremities in DF, PF, Inversion and Eversion. Deceased ROM in DF of ankle joint.  Neurological: Sensation intact to light touch.   Assessment:   1. Ingrown right greater toenail      Plan:  Patient was evaluated and treated and all questions answered. Patient requesting removal of ingrown nail today. Procedure below.  Discussed procedure and post procedure care and patient expressed understanding.  Will follow-up in 2 weeks for nail check or sooner if any problems arise.    Procedure:  Procedure: partial Nail Avulsion of right hallux medial nail border.  Surgeon: Louann Sjogren, DPM   Pre-op Dx: Ingrown toenail with infection Post-op: Same  Place of Surgery: Office exam room.  Indications for surgery: Painful and ingrown toenail.    The patient is requesting removal of nail without chemical matrixectomy. Risks and complications were discussed with the patient for which they understand and written consent was obtained. Under sterile conditions a total of 3 mL of  1% lidocaine plain was infiltrated in a hallux block fashion. Once anesthetized, the skin was prepped in sterile fashion. A tourniquet was then applied. Next the medial aspect of hallux nail border was then sharply excised making sure to remove the entire offending nail border.  Area was copiously irrigated. Silvadene was applied. A dry sterile dressing was applied. After application of the dressing the tourniquet was removed and there is found to be an immediate capillary refill time to the digit. The patient tolerated the procedure well without any complications. Post procedure instructions were discussed the patient for which he verbally understood. Follow-up in two weeks for nail check or sooner if any problems are to arise. Discussed signs/symptoms of infection and directed to call the office immediately should any occur or go directly to the emergency room. In the meantime, encouraged to call the office with any questions, concerns, changes symptoms.   Louann Sjogren, DPM

## 2021-12-17 ENCOUNTER — Inpatient Hospital Stay (HOSPITAL_COMMUNITY)
Admission: AD | Admit: 2021-12-17 | Discharge: 2021-12-17 | Disposition: A | Discharge status: Home / Self Care | Payer: No Typology Code available for payment source | Attending: Obstetrics & Gynecology | Admitting: Obstetrics & Gynecology

## 2021-12-17 ENCOUNTER — Encounter (HOSPITAL_COMMUNITY): Payer: Self-pay | Admitting: Obstetrics & Gynecology

## 2021-12-17 DIAGNOSIS — O26899 Other specified pregnancy related conditions, unspecified trimester: Secondary | ICD-10-CM

## 2021-12-17 DIAGNOSIS — R102 Pelvic and perineal pain: Secondary | ICD-10-CM | POA: Diagnosis not present

## 2021-12-17 DIAGNOSIS — O26892 Other specified pregnancy related conditions, second trimester: Secondary | ICD-10-CM | POA: Diagnosis not present

## 2021-12-17 DIAGNOSIS — Z3A18 18 weeks gestation of pregnancy: Secondary | ICD-10-CM | POA: Diagnosis not present

## 2021-12-17 DIAGNOSIS — R1030 Lower abdominal pain, unspecified: Secondary | ICD-10-CM | POA: Diagnosis present

## 2021-12-17 LAB — URINALYSIS, ROUTINE W REFLEX MICROSCOPIC
Bilirubin Urine: NEGATIVE
Glucose, UA: NEGATIVE mg/dL
Hgb urine dipstick: NEGATIVE
Ketones, ur: NEGATIVE mg/dL
Leukocytes,Ua: NEGATIVE
Nitrite: NEGATIVE
Protein, ur: NEGATIVE mg/dL
Specific Gravity, Urine: 1.012 (ref 1.005–1.030)
pH: 6 (ref 5.0–8.0)

## 2021-12-17 MED ORDER — ACETAMINOPHEN 500 MG PO TABS
1000.0000 mg | ORAL_TABLET | Freq: Once | ORAL | Status: AC
  Administered 2021-12-17: 1000 mg via ORAL
  Filled 2021-12-17: qty 2

## 2021-12-17 NOTE — Discharge Instructions (Signed)

## 2021-12-17 NOTE — MAU Note (Signed)
..  Cindy Jones is a 31 y.o. at [redacted]w[redacted]d here in MAU reporting: constant stabbing lower abdominal pain for 5 hours.  Thought the pain was gas so she took gasx but has not had relief.  Denies vaginal bleeding.   Pain score: 4-7/10 gets worse with movement.  Vitals:   12/17/21 0110  BP: 108/76  Pulse: 70  Resp: 17  Temp: 98.3 F (36.8 C)  SpO2: 100%     FHT:145 Lab orders placed from triage: UA

## 2021-12-17 NOTE — MAU Provider Note (Signed)
History     CSN: [id number]  Arrival date and time: 12/17/21 0044   Event Date/Time   First Provider Initiated Contact with Patient 12/17/21 0143      Chief Complaint  Patient presents with   Abdominal Pain   HPI  Cindy Jones is a 31 y.o. G2P0010 at [redacted]w[redacted]d who presents for evaluation of lower abdominal pain. Patient reports it is in the middle of her lower abdomen and along the left side. She states it has lasted for about 5 hours. She states she tried a gasX with no relief. She rates the pain a 4-7/10. She reports the pain is worse with movement or when she goes from lying to sitting up. She reports the pain is improved at rest. She denies any vaginal bleeding, discharge, and leaking of fluid. Denies any constipation, diarrhea or any urinary complaints. Reports normal fetal movement. She denies any fever, nausea or vomiting.  OB History     Gravida  2   Para  0   Term  0   Preterm  0   AB  1   Living  0      SAB  1   IAB  0   Ectopic  0   Multiple  0   Live Births              Past Medical History:  Diagnosis Date   Anxiety    Asthma    as a child   Binge eating disorder    Dysrhythmia    " I feel it about once a month"   Frequent headaches    GAD (generalized anxiety disorder)    GERD (gastroesophageal reflux disease)    History of acute PID 06/09/2019   Major depression in complete remission (HCC)    Migraines    PID (acute pelvic inflammatory disease)    Seasonal allergies    Tourette disorder    Urinary tract bacterial infections     Past Surgical History:  Procedure Laterality Date   CHOLECYSTECTOMY     ESOPHAGOGASTRODUODENOSCOPY     LAPAROSCOPIC CHOLECYSTECTOMY SINGLE SITE WITH INTRAOPERATIVE CHOLANGIOGRAM N/A 03/15/2016   Procedure: LAPAROSCOPIC CHOLECYSTECTOMY SINGLE SITE WITH INTRAOPERATIVE CHOLANGIOGRAM;  Surgeon: Karie Soda, MD;  Location: [facility] OR;  Service: General;  Laterality: N/A;   TONSILLECTOMY AND ADENOIDECTOMY  2013    WISDOM TOOTH EXTRACTION      Family History  Problem Relation Age of Onset   Alcohol abuse Mother    Bipolar disorder Mother    Cancer Father        sarcoma; passed away when pt was 8   Asthma Sister    Allergic rhinitis Sister    Eczema Sister    Emphysema Maternal Grandmother    Tourette syndrome Maternal Grandfather    Emphysema Paternal Grandmother    Heart disease Paternal Grandfather    Tourette syndrome Maternal Aunt    Alcohol abuse Maternal Aunt    Alcohol abuse Maternal Uncle    Hyperlipidemia Paternal Aunt    Hashimoto's thyroiditis Paternal Aunt    Suicidality Neg Hx    Urticaria Neg Hx    Angioedema Neg Hx     Social History   Tobacco Use   Smoking status: Never   Smokeless tobacco: Never  Vaping Use   Vaping Use: Never used  Substance Use Topics   Alcohol use: Not Currently    Alcohol/week: 1.0 standard drink of alcohol    Types: 1 Glasses of wine per week  Comment: occ   Drug use: No    Allergies:  Allergies  Allergen Reactions   Latex Swelling and Rash   Barley Grass Hives   Cranberry Juice Powder Other (See Comments)    Throat pain.   Naproxen Other (See Comments)    "Liver starts hurting" Diarrhea   Nickel     Skin peeling   Sulfa Antibiotics Hives and Swelling    Medications Prior to Admission  Medication Sig Dispense Refill Last Dose   cetirizine (ZYRTEC ALLERGY) 10 MG tablet Take 1 tablet (10 mg total) by mouth 2 (two) times daily. 60 tablet 3 12/16/2021   diphenhydrAMINE (BENADRYL) 25 MG tablet Take 25 mg by mouth every 6 (six) hours as needed for allergies or sleep.   Past Month   FOLIC ACID PO Take 1 tablet by mouth daily.   12/16/2021   magnesium 30 MG tablet Take 30 mg by mouth 2 (two) times daily.   12/16/2021   Prenatal Vit-Fe Fumarate-FA (PRENATAL MULTIVITAMIN) TABS tablet Take 1 tablet by mouth daily at 12 noon.   12/16/2021   clindamycin (CLEOCIN T) 1 % lotion Apply a small amount to skin once to twice daily as needed for  flare (Patient not taking: Reported on 11/15/2021) 60 mL 5    Cyanocobalamin (VITAMIN B-12) 1000 MCG SUBL Place under the tongue. (Patient not taking: Reported on 12/08/2021)      Doxylamine-Pyridoxine 10-10 MG TBEC Take 1/2 tab by mouth at bedtime as needed. (Patient not taking: Reported on 12/08/2021) 120 tablet 0     Review of Systems  Constitutional: Negative.  Negative for fatigue and fever.  HENT: Negative.    Respiratory: Negative.  Negative for shortness of breath.   Cardiovascular: Negative.  Negative for chest pain.  Gastrointestinal:  Positive for abdominal pain. Negative for constipation, diarrhea, nausea and vomiting.  Genitourinary: Negative.  Negative for dysuria, vaginal bleeding and vaginal discharge.  Neurological: Negative.  Negative for dizziness and headaches.   Physical Exam   Blood pressure 108/76, pulse 70, temperature 98.3 F (36.8 C), temperature source Oral, resp. rate 17, height 5\' 6"  (1.676 m), weight 96.3 kg, last menstrual period 08/09/2021, SpO2 100 %.  Patient Vitals for the past 24 hrs:  BP Temp Temp src Pulse Resp SpO2 Height Weight  12/17/21 0110 108/76 98.3 F (36.8 C) Oral 70 17 100 % 5\' 6"  (1.676 m) 96.3 kg    Physical Exam Vitals and nursing note reviewed.  Constitutional:      General: She is not in acute distress.    Appearance: She is well-developed.  HENT:     Head: Normocephalic.  Eyes:     Pupils: Pupils are equal, round, and reactive to light.  Cardiovascular:     Rate and Rhythm: Normal rate and regular rhythm.     Heart sounds: Normal heart sounds.  Pulmonary:     Effort: Pulmonary effort is normal. No respiratory distress.     Breath sounds: Normal breath sounds.  Abdominal:     General: Bowel sounds are normal. There is no distension.     Palpations: Abdomen is soft.     Tenderness: There is no abdominal tenderness.  Skin:    General: Skin is warm and dry.  Neurological:     Mental Status: She is alert and oriented to  person, place, and time.  Psychiatric:        Mood and Affect: Mood normal.        Behavior: Behavior normal.  Thought Content: Thought content normal.        Judgment: Judgment normal.     FHT: 145 bpm   MAU Course  Procedures  Results for orders placed or performed during the hospital encounter of 12/17/21 (from the past 24 hour(s))  Urinalysis, Routine w reflex microscopic Urine, Clean Catch     Status: None   Collection Time: 12/17/21  1:22 AM  Result Value Ref Range   Color, Urine YELLOW YELLOW   APPearance CLEAR CLEAR   Specific Gravity, Urine 1.012 1.005 - 1.030   pH 6.0 5.0 - 8.0   Glucose, UA NEGATIVE NEGATIVE mg/dL   Hgb urine dipstick NEGATIVE NEGATIVE   Bilirubin Urine NEGATIVE NEGATIVE   Ketones, ur NEGATIVE NEGATIVE mg/dL   Protein, ur NEGATIVE NEGATIVE mg/dL   Nitrite NEGATIVE NEGATIVE   Leukocytes,Ua NEGATIVE NEGATIVE    MDM Labs ordered and reviewed.   UA Cervix closed/thick/posterior  CNM reviewed with patient no signs of preterm labor. Patient expressed concerns about reading about cerclages being needed. CNM provided reassurance that cervix shows no signs of change at this time and a cerclage would not be indicated. Patient asked about needing ultrasound. Reviewed indications for ultrasound and given presence of FHT and closed cervix, no indication for emergent ultrasound at this time. Patient expressed concern that this pain seems more than she expected and earlier than expected for round ligament pain. CNM offered pain management and discussed pregnancy support belt to help with pain.   Low suspicion for appendicitis due to location of pain and absence of fever and GI complaints.   CNM reviewed with patient that all possible acute causes of pain including preterm labor, UTI, appendicitis, no reported bowel concerns, and non surgical abdomen have been addressed at this point. CNM offered pain management again and patient agreeable to try tylenol for  pain  Tylenol PO- patient reports she is able to change positions without as much pain and feels like the tylenol is helping. CNM encourage patient to change positions slowly and use good body mechanics to help with pain.  Assessment and Plan   1. Pain of round ligament during pregnancy   2. [redacted] weeks gestation of pregnancy    -Discharge home in stable condition -Preterm labor precautions discussed -Patient advised to follow-up with OB as scheduled for prenatal care -Patient may return to MAU as needed or if her condition were to change or worsen  Rolm Bookbinder, CNM 12/17/2021, 2:29 AM

## 2021-12-19 ENCOUNTER — Encounter (HOSPITAL_COMMUNITY): Payer: Self-pay | Admitting: Licensed Clinical Social Worker

## 2021-12-19 ENCOUNTER — Ambulatory Visit (INDEPENDENT_AMBULATORY_CARE_PROVIDER_SITE_OTHER): Payer: No Typology Code available for payment source | Admitting: Licensed Clinical Social Worker

## 2021-12-19 DIAGNOSIS — F410 Panic disorder [episodic paroxysmal anxiety] without agoraphobia: Secondary | ICD-10-CM

## 2021-12-19 DIAGNOSIS — F411 Generalized anxiety disorder: Secondary | ICD-10-CM

## 2021-12-19 NOTE — Progress Notes (Signed)
Virtual Visit via Video Note  I connected with Cindy Jones on 12/19/21 at 1:00pm EST by a video enabled telemedicine application and verified that I am speaking with the correct person using two identifiers.   I discussed the limitations of evaluation and management by telemedicine and the availability of in person appointments. The patient expressed understanding and agreed to proceed.  LOCATION: Patient: Home Provider: Home Office  History of Present Illness: Pt was referred to therapy by her psychiatrist at [facility] for anxiety and panic attacks.   Participation Level: Active   Type of Therapy: Virtual Video individual therapy   Treatment Goal Addressed: Pt will verbalize an understanding of how thoughts, physical feelings, and behavioral actions contribute to anxiety AEB self report  Interventions: CBT/Supportive/Psychoeducation  Summary: Patient presented for today's session sick with a cold/sinus and morning sickness on time and was alert, oriented x5, with no evidence or self-report of SI/HI or A/V H. Clinician inquired about patient's current emotional ratings, as well as any significant changes in thoughts, feelings or behavior since previous session. Patient's ratings 2/10 for depression, 3/10 for anxiety, 2/10 for anger/irritability, and denied any panic attacks since last session. Cln and pt explored emotional ratings and coping skills. Clinician utilized CBT to process pregnancy concerns and work challenges. Clinician processed thoughts, feelings, and behaviors. Clinician discussed coping skills and options for supporting herself through these challenging times. Cln provided an update on family, work, stressors, health, pregnancy journey. Pt still struggles with her lack of motivation, lack of completion of work product, always feeling tired, unable to complete tasks around the house. Cln asked open ended questions and provided education on compartmentalization, to keep her from  becoming overwhelmed, which increases her anxiety.Pt is still struggling with her self image, which I'm sure will increase as the baby grows larger. Cln suggested pt talk with her obgyn about self image and growth of the baby. Pt does not want to be referred to ED therapist at this time.       PLAN: screenings   PLAN: therapy and anxiety notebooks, takeaways, goals   Collaboration of Care: Other: Continue to work with psychiatrist for medication management  Patient/Guardian was advised Release of Information must be obtained prior to any record release in order to collaborate their care with an outside provider. Patient/Guardian was advised if they have not already done so to contact the registration department to sign all necessary forms in order for [place] to release information regarding their care.   Consent: Patient/Guardian gives verbal consent for treatment and assignment of benefits for services provided during this visit. Patient/Guardian expressed understanding and agreed to proceed.      Assessment and plan: Counselor will continue to meet with patient to address treatment plan goals. Patient will continue to follow recommendations of providers and implement skills learned in session.  Suicidal/Homicidal: Nowithout intent/plan  Therapist Response: Assessed pt's current functioning and reviewed progress.. Assisted pt processing stressors, negative thoughts.  Assisted pt processing for the management of her stressors.  Participation Level: Active  Diagnosis: Axis I.:  GAD    Follow Up Instructions: I discussed the assessment and treatment plan with the patient. The patient was provided an opportunity to ask questions and all were answered. The patient agreed with the plan and demonstrated an understanding of the instructions.   The patient was advised to call back or seek an in-person evaluation if the symptoms worsen or if the condition fails to improve as anticipated.  I  provided  45 minutes of non-face-to-face time during this encounter.   Ladena Jacquez S, LCAS

## 2021-12-20 ENCOUNTER — Ambulatory Visit: Payer: No Typology Code available for payment source

## 2021-12-22 ENCOUNTER — Other Ambulatory Visit: Payer: Self-pay | Admitting: Family Medicine

## 2021-12-22 ENCOUNTER — Ambulatory Visit: Payer: No Typology Code available for payment source | Admitting: *Deleted

## 2021-12-22 ENCOUNTER — Other Ambulatory Visit: Payer: Self-pay | Admitting: *Deleted

## 2021-12-22 ENCOUNTER — Ambulatory Visit: Payer: No Typology Code available for payment source | Attending: Family Medicine

## 2021-12-22 VITALS — BP 110/62 | HR 66

## 2021-12-22 DIAGNOSIS — O09891 Supervision of other high risk pregnancies, first trimester: Secondary | ICD-10-CM | POA: Diagnosis not present

## 2021-12-22 DIAGNOSIS — Z3480 Encounter for supervision of other normal pregnancy, unspecified trimester: Secondary | ICD-10-CM | POA: Insufficient documentation

## 2021-12-22 DIAGNOSIS — Z363 Encounter for antenatal screening for malformations: Secondary | ICD-10-CM | POA: Insufficient documentation

## 2021-12-22 DIAGNOSIS — Z3689 Encounter for other specified antenatal screening: Secondary | ICD-10-CM | POA: Insufficient documentation

## 2021-12-22 DIAGNOSIS — Z3A19 19 weeks gestation of pregnancy: Secondary | ICD-10-CM | POA: Diagnosis not present

## 2021-12-26 ENCOUNTER — Ambulatory Visit (HOSPITAL_COMMUNITY): Payer: No Typology Code available for payment source | Admitting: Licensed Clinical Social Worker

## 2021-12-27 ENCOUNTER — Ambulatory Visit (INDEPENDENT_AMBULATORY_CARE_PROVIDER_SITE_OTHER): Payer: No Typology Code available for payment source | Admitting: Licensed Clinical Social Worker

## 2021-12-27 DIAGNOSIS — F411 Generalized anxiety disorder: Secondary | ICD-10-CM

## 2021-12-29 ENCOUNTER — Encounter: Payer: Self-pay | Admitting: Family Medicine

## 2021-12-29 ENCOUNTER — Ambulatory Visit (INDEPENDENT_AMBULATORY_CARE_PROVIDER_SITE_OTHER): Payer: No Typology Code available for payment source | Admitting: Family Medicine

## 2021-12-29 ENCOUNTER — Other Ambulatory Visit: Payer: Self-pay

## 2021-12-29 VITALS — BP 112/74 | HR 79 | Wt 216.9 lb

## 2021-12-29 DIAGNOSIS — Z3480 Encounter for supervision of other normal pregnancy, unspecified trimester: Secondary | ICD-10-CM

## 2021-12-29 DIAGNOSIS — Z2839 Other underimmunization status: Secondary | ICD-10-CM

## 2021-12-29 DIAGNOSIS — O09899 Supervision of other high risk pregnancies, unspecified trimester: Secondary | ICD-10-CM

## 2021-12-29 NOTE — Progress Notes (Signed)
   PRENATAL VISIT NOTE  Subjective:  Cindy Jones is a 31 y.o. G2P0010 at 55w2dbeing seen today for ongoing prenatal care.  She is currently monitored for the following issues for this low-risk pregnancy and has Tourette disease; ADD (attention deficit disorder); Bulimia; Overweight (BMI 25.0-29.9); GAD (generalized anxiety disorder); Exercise-induced asthma; Urticaria; Other adverse food reactions, not elsewhere classified, subsequent encounter; Other allergic rhinitis; History of asthma; Supervision of other normal pregnancy, antepartum; Major depressive disorder, single episode, in full remission [place]; and Rubella non-immune status, antepartum on their problem list.  Patient reports no complaints.  Contractions: Not present. Vag. Bleeding: None.  Movement: Present. Denies leaking of fluid.   The following portions of the patient's history were reviewed and updated as appropriate: allergies, current medications, past family history, past medical history, past social history, past surgical history and problem list.   Objective:   Vitals:   12/29/21 1609  BP: 112/74  Pulse: 79  Weight: 216 lb 14.4 oz (98.4 kg)    Fetal Status: Fetal Heart Rate (bpm): 146   Movement: Present     General:  Alert, oriented and cooperative. Patient is in no acute distress.  Skin: Skin is warm and dry. No rash noted.   Cardiovascular: Normal heart rate noted  Respiratory: Normal respiratory effort, no problems with respiration noted  Abdomen: Soft, gravid, appropriate for gestational age.  Pain/Pressure: Present     Pelvic: Cervical exam deferred        Extremities: Normal range of motion.     Mental Status: Normal mood and affect. Normal behavior. Normal judgment and thought content.   Assessment and Plan:  Pregnancy: G2P0010 at 225w2d. Supervision of other normal pregnancy, antepartum Multiple questions about car seats.  Talked about severe round ligament pain Recommended chiropractic/OMT  care Reviewed use of belly band  2. Rubella non-immune status, antepartum MMR PP  Preterm labor symptoms and general obstetric precautions including but not limited to vaginal bleeding, contractions, leaking of fluid and fetal movement were reviewed in detail with the patient. Please refer to After Visit Summary for other counseling recommendations.   Return in about 4 weeks (around 01/26/2022) for Mom+Baby Combined Care.  Future Appointments  Date Time Provider DeAs[place]9/22/2023  8:30 AM [facility]/22/2023  8:45 AM WMC-MFC US4 [facility]9/27/2023  8:35 AM EcClarnce FlockMD [facility]10/03/2022  3:30 PM AgCharlcie CradleMD BH-BHCA None  02/22/2022  8:20 AM WMC-WOCA LAB [facility]CWH [facility]10/25/2023  8:35 AM EcClarnce FlockMD [facility]11/10/2021  9:00 AM ToBerniece SalinesDO CVD-[facility]TH[facility] None    KiCaren MacadamMD

## 2021-12-29 NOTE — Patient Instructions (Signed)
Cindy Jones - Chiropractor ? ?[facility] ?[address] [place], [place] [address] ?[phone] ? ?[email or web address] ? ?

## 2022-01-09 ENCOUNTER — Ambulatory Visit (INDEPENDENT_AMBULATORY_CARE_PROVIDER_SITE_OTHER): Payer: No Typology Code available for payment source | Admitting: Licensed Clinical Social Worker

## 2022-01-09 ENCOUNTER — Encounter (HOSPITAL_COMMUNITY): Payer: Self-pay | Admitting: Licensed Clinical Social Worker

## 2022-01-09 DIAGNOSIS — F411 Generalized anxiety disorder: Secondary | ICD-10-CM | POA: Diagnosis not present

## 2022-01-09 NOTE — Progress Notes (Signed)
Virtual Visit via Video Note  I connected with Cindy Jones on 01/09/22 at 1:00pm EST by a video enabled telemedicine application and verified that I am speaking with the correct person using two identifiers.   I discussed the limitations of evaluation and management by telemedicine and the availability of in person appointments. The patient expressed understanding and agreed to proceed.  LOCATION: Patient: Home Provider: Home Office  History of Present Illness: Pt was referred to therapy by her psychiatrist at [facility] for anxiety and panic attacks.   Participation Level: Active   Type of Therapy: Virtual Video individual therapy   Treatment Goal Addressed: Pt will verbalize an understanding of how thoughts, physical feelings, and behavioral actions contribute to anxiety AEB self report  Interventions: CBT/Supportive/Psychoeducation  Summary: Patient presented for today's session sick with a cold/sinus and morning sickness on time and was alert, oriented x5, with no evidence or self-report of SI/HI or A/V H. Clinician inquired about patient's current emotional ratings, as well as any significant changes in thoughts, feelings or behavior since previous session. Patient's ratings 3/10 for depression, 3/10 for anxiety, 1/10 for anger/irritability, and denied any panic attacks since last session. Cln and pt explored emotional ratings and coping skills. Clinician utilized CBT to process pregnancy concerns and work challenges. Clinician processed thoughts, feelings, and behaviors. Clinician discussed coping skills and options for supporting herself through these challenging times. Cln provided an update on family, work, stressors, health, pregnancy journey. Pt was tearful in describing her feelings about her lack of motivation, lack of completion of work product, always feeling tired, unable to complete tasks around the house. Pt's husband joined the session where he spoke to his wife assuring her and  helping her with self image. Cln and pt explored pt returning to 10mg  of Adderall to assist with symptoms (ADD and Tourettes) and will discuss it with Dr. Thursday at her appt.     PLAN: screenings   PLAN: therapy and anxiety notebooks, takeaways, goals   Collaboration of Care: Other: Continue to work with psychiatrist for medication management  Patient/Guardian was advised Release of Information must be obtained prior to any record release in order to collaborate their care with an outside provider. Patient/Guardian was advised if they have not already done so to contact the registration department to sign all necessary forms in order for Wednesday to release information regarding their care.   Consent: Patient/Guardian gives verbal consent for treatment and assignment of benefits for services provided during this visit. Patient/Guardian expressed understanding and agreed to proceed.      Assessment and plan: Counselor will continue to meet with patient to address treatment plan goals. Patient will continue to follow recommendations of providers and implement skills learned in session.  Suicidal/Homicidal: Nowithout intent/plan  Therapist Response: Assessed pt's current functioning and reviewed progress.. Assisted pt processing stressors, negative thoughts.  Assisted pt processing for the management of her stressors.  Participation Level: Active  Diagnosis: Axis I.:  GAD    Follow Up Instructions: I discussed the assessment and treatment plan with the patient. The patient was provided an opportunity to ask questions and all were answered. The patient agreed with the plan and demonstrated an understanding of the instructions.   The patient was advised to call back or seek an in-person evaluation if the symptoms worsen or if the condition fails to improve as anticipated.  I provided 60 minutes of non-face-to-face time during this encounter.   Mertha Clyatt S, LCAS

## 2022-01-14 ENCOUNTER — Encounter (HOSPITAL_COMMUNITY): Payer: Self-pay | Admitting: Licensed Clinical Social Worker

## 2022-01-14 NOTE — Progress Notes (Signed)
Virtual Visit via Video Note  I connected with Cindy Jones on 10/12/21 at 1:00pm EST by a video enabled telemedicine application and verified that I am speaking with the correct person using two identifiers.   I discussed the limitations of evaluation and management by telemedicine and the availability of in person appointments. The patient expressed understanding and agreed to proceed.  LOCATION: Patient: Home Provider: Home Office  History of Present Illness: Pt was referred to therapy by her psychiatrist at [facility] for anxiety and panic attacks.   Participation Level: Active   Type of Therapy: Virtual Video individual therapy   Treatment Goal Addressed: Pt will verbalize an understanding of how thoughts, physical feelings, and behavioral actions contribute to anxiety AEB self report  Interventions: CBT/Supportive/Psychoeducation  Summary: Patient presented for today's session sick with a cold/sinus and morning sickness on time and was alert, oriented x5, with no evidence or self-report of SI/HI or A/V H. Clinician inquired about patient's current emotional ratings, as well as any significant changes in thoughts, feelings or behavior since previous session. Patient's ratings 1/10 for depression, 5/10 for anxiety, 1/10 for anger/irritability, and denied any panic attacks since last session. Cln and pt explored emotional ratings and coping skills. Cln used CBT to assist Pt processing an update on family, work, stressors, health, pregnancy journey. "I continue to struggle with not being 100% at work." Cln suggested she give herself permission to not be 100% at work currently due to morning sickness, throwing up, being tired. Cln and pt explored a daily routine to work, rest, self care.Marland KitchenMarland KitchenCln provided education on practicing self care and self-awareness can help her to recognize patterns in her emotions, including events or situations that can trigger worsened symptoms. Pt was in  agreement.       PLAN: therapy and anxiety notebooks, takeaways, goals   Collaboration of Care: Other: Continue to see Dr. Doyne Keel psychiatrist.  Patient/Guardian was advised Release of Information must be obtained prior to any record release in order to collaborate their care with an outside provider. Patient/Guardian was advised if they have not already done so to contact the registration department to sign all necessary forms in order for [place] to release information regarding their care.   Consent: Patient/Guardian gives verbal consent for treatment and assignment of benefits for services provided during this visit. Patient/Guardian expressed understanding and agreed to proceed.      Assessment and plan: Counselor will continue to meet with patient to address treatment plan goals. Patient will continue to follow recommendations of providers and implement skills learned in session.  Suicidal/Homicidal: Nowithout intent/plan  Therapist Response: Assessed pt's current functioning and reviewed progress.. Assisted pt processing stressors, negative thoughts.  Assisted pt processing for the management of her stressors.  Participation Level: Active  Diagnosis: Axis I.:  GAD    Follow Up Instructions: I discussed the assessment and treatment plan with the patient. The patient was provided an opportunity to ask questions and all were answered. The patient agreed with the plan and demonstrated an understanding of the instructions.   The patient was advised to call back or seek an in-person evaluation if the symptoms worsen or if the condition fails to improve as anticipated.  I provided 45 minutes of non-face-to-face time during this encounter.   Antonela Freiman S, LCAS

## 2022-01-15 ENCOUNTER — Encounter (HOSPITAL_COMMUNITY): Payer: Self-pay | Admitting: Licensed Clinical Social Worker

## 2022-01-15 NOTE — Progress Notes (Signed)
Virtual Visit via Video Note  I connected with Cindy Jones on 10/17/21 at 1:00pm EST by a video enabled telemedicine application and verified that I am speaking with the correct person using two identifiers.   I discussed the limitations of evaluation and management by telemedicine and the availability of in person appointments. The patient expressed understanding and agreed to proceed.  LOCATION: Patient: Home Provider: Home Office  History of Present Illness: Pt was referred to therapy by her psychiatrist at [facility] for anxiety and panic attacks.   Participation Level: Active   Type of Therapy: Virtual Video individual therapy   Treatment Goal Addressed: Pt will verbalize an understanding of how thoughts, physical feelings, and behavioral actions contribute to anxiety AEB self report  Interventions: CBT/Supportive/Psychoeducation  Summary: Patient presented for today's session sick with a cold/sinus and morning sickness on time and was alert, oriented x5, with no evidence or self-report of SI/HI or A/V H. Clinician inquired about patient's current emotional ratings, as well as any significant changes in thoughts, feelings or behavior since previous session. Patient's ratings 1/10 for depression, 5/10 for anxiety, 1/10 for anger/irritability, and denied any panic attacks since last session. Cln and pt explored emotional ratings and coping skills. Pt processing an update on family, work, stressors, health, pregnancy. Clinician utilized CBT to process concerns about mother, , and work challenges. Clinician processed thoughts, feelings, and behaviors. Clinician discussed coping skills and options for supporting herself through these challenging times. Pt discussed her biggest stressor: pregnancy: changes in moods, gaining weight, less work Science writer.         PLAN: therapy and anxiety notebooks, takeaways, goals   Collaboration of Care: Other: Continue to see Dr. Doyne Keel  psychiatrist.  Patient/Guardian was advised Release of Information must be obtained prior to any record release in order to collaborate their care with an outside provider. Patient/Guardian was advised if they have not already done so to contact the registration department to sign all necessary forms in order for [place] to release information regarding their care.   Consent: Patient/Guardian gives verbal consent for treatment and assignment of benefits for services provided during this visit. Patient/Guardian expressed understanding and agreed to proceed.      Assessment and plan: Counselor will continue to meet with patient to address treatment plan goals. Patient will continue to follow recommendations of providers and implement skills learned in session.  Suicidal/Homicidal: Nowithout intent/plan  Therapist Response: Assessed pt's current functioning and reviewed progress.. Assisted pt processing stressors, negative thoughts.  Assisted pt processing for the management of her stressors.  Participation Level: Active  Diagnosis: Axis I.:  GAD    Follow Up Instructions: I discussed the assessment and treatment plan with the patient. The patient was provided an opportunity to ask questions and all were answered. The patient agreed with the plan and demonstrated an understanding of the instructions.   The patient was advised to call back or seek an in-person evaluation if the symptoms worsen or if the condition fails to improve as anticipated.  I provided 45 minutes of non-face-to-face time during this encounter.   Soriya Worster S, LCAS

## 2022-01-16 ENCOUNTER — Ambulatory Visit (INDEPENDENT_AMBULATORY_CARE_PROVIDER_SITE_OTHER): Payer: No Typology Code available for payment source | Admitting: Licensed Clinical Social Worker

## 2022-01-16 ENCOUNTER — Encounter (HOSPITAL_COMMUNITY): Payer: Self-pay | Admitting: Licensed Clinical Social Worker

## 2022-01-16 DIAGNOSIS — F411 Generalized anxiety disorder: Secondary | ICD-10-CM

## 2022-01-16 NOTE — Progress Notes (Signed)
Virtual Visit via Video Note  I connected with Cindy Jones on 01/16/22 at 1:00pm EST by a video enabled telemedicine application and verified that I am speaking with the correct person using two identifiers.   I discussed the limitations of evaluation and management by telemedicine and the availability of in person appointments. The patient expressed understanding and agreed to proceed.  LOCATION: Patient: Home Provider: Home Office  History of Present Illness: Pt was referred to therapy by her psychiatrist at [facility] for anxiety and panic attacks.   Participation Level: Active   Type of Therapy: Virtual Video individual therapy   Treatment Goal Addressed: Pt will verbalize an understanding of how thoughts, physical feelings, and behavioral actions contribute to anxiety AEB self report  Interventions: CBT/Supportive/Psychoeducation  Summary: Patient presented for today's session sick with a cold/sinus and morning sickness on time and was alert, oriented x5, with no evidence or self-report of SI/HI or A/V H. Clinician inquired about patient's current emotional ratings, as well as any significant changes in thoughts, feelings or behavior since previous session. Patient's ratings 3/10 for depression, 3/10 for anxiety, 3/10 for anger/irritability, and denied any panic attacks since last session. Pt tearfully reports on her self-image, being pregnant and gaining weight. "My husband is not being supportive and is getting frustrated of me being tired all the time, aches and pains." Cln and pt researched books for husbands/pregnant wives. Clinician utilized CBT to process pregnancy concerns and work challenges. Clinician processed thoughts, feelings, and behaviors. Clinician discussed coping skills and options for supporting herself through these challenging times. Cln provided an update on family, work, stressors, health, pregnancy journey.    PLAN: screenings   PLAN: therapy and anxiety notebooks,  takeaways, goals   Collaboration of Care: Other: Continue to work with psychiatrist for medication management  Patient/Guardian was advised Release of Information must be obtained prior to any record release in order to collaborate their care with an outside provider. Patient/Guardian was advised if they have not already done so to contact the registration department to sign all necessary forms in order for [place] to release information regarding their care.   Consent: Patient/Guardian gives verbal consent for treatment and assignment of benefits for services provided during this visit. Patient/Guardian expressed understanding and agreed to proceed.      Assessment and plan: Counselor will continue to meet with patient to address treatment plan goals. Patient will continue to follow recommendations of providers and implement skills learned in session.  Suicidal/Homicidal: Nowithout intent/plan  Therapist Response: Assessed pt's current functioning and reviewed progress.. Assisted pt processing stressors, negative thoughts.  Assisted pt processing for the management of her stressors.  Participation Level: Active  Diagnosis: Axis I.:  GAD    Follow Up Instructions: I discussed the assessment and treatment plan with the patient. The patient was provided an opportunity to ask questions and all were answered. The patient agreed with the plan and demonstrated an understanding of the instructions.   The patient was advised to call back or seek an in-person evaluation if the symptoms worsen or if the condition fails to improve as anticipated.  I provided 60 minutes of non-face-to-face time during this encounter.   Ryett Hamman S, LCAS

## 2022-01-20 ENCOUNTER — Other Ambulatory Visit: Payer: Self-pay | Admitting: *Deleted

## 2022-01-20 ENCOUNTER — Ambulatory Visit: Payer: No Typology Code available for payment source | Attending: Obstetrics and Gynecology

## 2022-01-20 ENCOUNTER — Ambulatory Visit: Payer: No Typology Code available for payment source | Admitting: *Deleted

## 2022-01-20 VITALS — BP 100/59 | HR 67

## 2022-01-20 DIAGNOSIS — O09891 Supervision of other high risk pregnancies, first trimester: Secondary | ICD-10-CM | POA: Insufficient documentation

## 2022-01-20 DIAGNOSIS — Z3A23 23 weeks gestation of pregnancy: Secondary | ICD-10-CM

## 2022-01-20 DIAGNOSIS — Z3689 Encounter for other specified antenatal screening: Secondary | ICD-10-CM

## 2022-01-22 ENCOUNTER — Encounter (HOSPITAL_COMMUNITY): Payer: Self-pay | Admitting: Licensed Clinical Social Worker

## 2022-01-22 NOTE — Progress Notes (Signed)
Virtual Visit via Video Note  I connected with Cindy Jones on 12/27/21 at 3:00pm EST by a video enabled telemedicine application and verified that I am speaking with the correct person using two identifiers.   I discussed the limitations of evaluation and management by telemedicine and the availability of in person appointments. The patient expressed understanding and agreed to proceed.  LOCATION: Patient: Home Provider: Home Office  History of Present Illness: Pt was referred to therapy by her psychiatrist at [facility] for anxiety and panic attacks.   Participation Level: Active   Type of Therapy: Virtual Video individual therapy   Treatment Goal Addressed: Pt will verbalize an understanding of how thoughts, physical feelings, and behavioral actions contribute to anxiety AEB self report  Interventions: CBT/Supportive/Psychoeducation  Summary: Patient presented for today's session sick with a cold/sinus and morning sickness on time and was alert, oriented x5, with no evidence or self-report of SI/HI or A/V H. Clinician inquired about patient's current emotional ratings, as well as any significant changes in thoughts, feelings or behavior since previous session. Patient's ratings 2/10 for depression, 3/10 for anxiety, 2/10 for anger/irritability, and denied any panic attacks since last session. Cln and pt explored emotional ratings and coping skills. Clinician utilized CBT to process pregnancy concerns and work challenges. Clinician processed thoughts, feelings, and behaviors. Clinician discussed coping skills and options for supporting herself emotionally. Pt provided an update on family, work, stressors, health, pregnancy journey, husband. Pt was tearful in describing her feelings about the way her husband talks to her because of his low frustration tolerance about her lack of motivation, feeling tired all the time, her crying continuously, not keeping up with housework. Cln provided information  and education on LFT to work with her husband on understanding.       PLAN: screenings   PLAN: therapy and anxiety notebooks, takeaways, goals   Collaboration of Care: Other: Continue to work with psychiatrist for medication management  Patient/Guardian was advised Release of Information must be obtained prior to any record release in order to collaborate their care with an outside provider. Patient/Guardian was advised if they have not already done so to contact the registration department to sign all necessary forms in order for [place] to release information regarding their care.   Consent: Patient/Guardian gives verbal consent for treatment and assignment of benefits for services provided during this visit. Patient/Guardian expressed understanding and agreed to proceed.      Assessment and plan: Counselor will continue to meet with patient to address treatment plan goals. Patient will continue to follow recommendations of providers and implement skills learned in session.  Suicidal/Homicidal: Nowithout intent/plan  Therapist Response: Assessed pt's current functioning and reviewed progress.. Assisted pt processing stressors, negative thoughts.  Assisted pt processing for the management of her stressors.  Participation Level: Active  Diagnosis: Axis I.:  GAD    Follow Up Instructions: I discussed the assessment and treatment plan with the patient. The patient was provided an opportunity to ask questions and all were answered. The patient agreed with the plan and demonstrated an understanding of the instructions.   The patient was advised to call back or seek an in-person evaluation if the symptoms worsen or if the condition fails to improve as anticipated.  I provided 60 minutes of non-face-to-face time during this encounter.   Sheketa Ende S, LCAS

## 2022-01-23 ENCOUNTER — Ambulatory Visit (INDEPENDENT_AMBULATORY_CARE_PROVIDER_SITE_OTHER): Payer: No Typology Code available for payment source | Admitting: Licensed Clinical Social Worker

## 2022-01-23 ENCOUNTER — Encounter (HOSPITAL_COMMUNITY): Payer: Self-pay | Admitting: Licensed Clinical Social Worker

## 2022-01-23 DIAGNOSIS — F411 Generalized anxiety disorder: Secondary | ICD-10-CM | POA: Diagnosis not present

## 2022-01-23 NOTE — Progress Notes (Signed)
Virtual Visit via Video Note  I connected with Cindy Jones on 01/23/22 at 1:00pm EST by a video enabled telemedicine application and verified that I am speaking with the correct person using two identifiers.   I discussed the limitations of evaluation and management by telemedicine and the availability of in person appointments. The patient expressed understanding and agreed to proceed.  LOCATION: Patient: Home Provider: Home Office  History of Present Illness: Pt was referred to therapy by her psychiatrist at [facility] for anxiety and panic attacks.   Participation Level: Active   Type of Therapy: Virtual Video individual therapy   Treatment Goal Addressed: Pt will verbalize an understanding of how thoughts, physical feelings, and behavioral actions contribute to anxiety AEB self report  Interventions: CBT/Supportive/Psychoeducation  Summary: Patient presented for today's session sick with a cold/sinus and morning sickness on time and was alert, oriented x5, with no evidence or self-report of SI/HI or A/V H. Clinician inquired about patient's current emotional ratings, as well as any significant changes in thoughts, feelings or behavior since previous session. Patient's ratings 2/10 for depression, 2/10 for anxiety, 2/10 for anger/irritability, and denied any panic attacks since last session. Pt reports on her ultrasound she had last week. "I got to see a better picture of him." "My husband and I are getting along better." Cln and pt explored his behaviors last week, possibly he was out of his adderall. Clinician utilized CBT to process pregnancy concerns and work challenges. Clinician processed thoughts, feelings, and behaviors. Clinician discussed coping skills Cln provided an update on family, work, stressors, health, pregnancy journey. Cln provided education on mindfulness skills.  PLAN: screenings   PLAN: therapy and anxiety notebooks, takeaways, goals   Collaboration of Care: Other:  Continue to work with psychiatrist for medication management  Patient/Guardian was advised Release of Information must be obtained prior to any record release in order to collaborate their care with an outside provider. Patient/Guardian was advised if they have not already done so to contact the registration department to sign all necessary forms in order for [place] to release information regarding their care.   Consent: Patient/Guardian gives verbal consent for treatment and assignment of benefits for services provided during this visit. Patient/Guardian expressed understanding and agreed to proceed.      Assessment and plan: Counselor will continue to meet with patient to address treatment plan goals. Patient will continue to follow recommendations of providers and implement skills learned in session.  Suicidal/Homicidal: Nowithout intent/plan  Therapist Response: Assessed pt's current functioning and reviewed progress.. Assisted pt processing stressors, negative thoughts.  Assisted pt processing for the management of her stressors.  Participation Level: Active  Diagnosis: Axis I.:  GAD    Follow Up Instructions: I discussed the assessment and treatment plan with the patient. The patient was provided an opportunity to ask questions and all were answered. The patient agreed with the plan and demonstrated an understanding of the instructions.   The patient was advised to call back or seek an in-person evaluation if the symptoms worsen or if the condition fails to improve as anticipated.  I provided 60 minutes of non-face-to-face time during this encounter.   Anae Hams S, LCAS

## 2022-01-24 ENCOUNTER — Encounter: Payer: No Typology Code available for payment source | Admitting: Family Medicine

## 2022-01-25 ENCOUNTER — Encounter: Payer: No Typology Code available for payment source | Admitting: Family Medicine

## 2022-01-25 ENCOUNTER — Other Ambulatory Visit: Payer: Self-pay

## 2022-01-25 ENCOUNTER — Encounter: Payer: Self-pay | Admitting: Family Medicine

## 2022-01-25 ENCOUNTER — Ambulatory Visit (INDEPENDENT_AMBULATORY_CARE_PROVIDER_SITE_OTHER): Payer: No Typology Code available for payment source | Admitting: Family Medicine

## 2022-01-25 VITALS — BP 114/76 | HR 76 | Wt 223.8 lb

## 2022-01-25 DIAGNOSIS — O09899 Supervision of other high risk pregnancies, unspecified trimester: Secondary | ICD-10-CM

## 2022-01-25 DIAGNOSIS — Z2839 Other underimmunization status: Secondary | ICD-10-CM

## 2022-01-25 DIAGNOSIS — Z3480 Encounter for supervision of other normal pregnancy, unspecified trimester: Secondary | ICD-10-CM

## 2022-01-25 DIAGNOSIS — F411 Generalized anxiety disorder: Secondary | ICD-10-CM

## 2022-01-25 NOTE — Progress Notes (Signed)
   Subjective:  Cindy Jones is a 31 y.o. G2P0010 at [redacted]w[redacted]d being seen today for ongoing prenatal care.  She is currently monitored for the following issues for this low-risk pregnancy and has Tourette disease; ADD (attention deficit disorder); Bulimia; Overweight (BMI 25.0-29.9); GAD (generalized anxiety disorder); Exercise-induced asthma; Urticaria; Other adverse food reactions, not elsewhere classified, subsequent encounter; Other allergic rhinitis; History of asthma; Supervision of other normal pregnancy, antepartum; Major depressive disorder, single episode, in full remission ([place]); and Rubella non-immune status, antepartum on their problem list.  Patient reports no complaints.  Contractions: Not present. Vag. Bleeding: None.  Movement: Present. Denies leaking of fluid.   The following portions of the patient's history were reviewed and updated as appropriate: allergies, current medications, past family history, past medical history, past social history, past surgical history and problem list. Problem list updated.  Objective:   Vitals:   01/25/22 0902  BP: 114/76  Pulse: 76  Weight: 223 lb 12.8 oz (101.5 kg)    Fetal Status: Fetal Heart Rate (bpm): 143   Movement: Present     General:  Alert, oriented and cooperative. Patient is in no acute distress.  Skin: Skin is warm and dry. No rash noted.   Cardiovascular: Normal heart rate noted  Respiratory: Normal respiratory effort, no problems with respiration noted  Abdomen: Soft, gravid, appropriate for gestational age. Pain/Pressure: Absent     Pelvic: Vag. Bleeding: None     Cervical exam deferred        Extremities: Normal range of motion.     Mental Status: Normal mood and affect. Normal behavior. Normal judgment and thought content.   Urinalysis:      Assessment and Plan:  Pregnancy: G2P0010 at [redacted]w[redacted]d  1. Supervision of other normal pregnancy, antepartum BP and FHR normal Discussed fasting labs for next visit, we reviewed  may need to use [place] to get labs given difficulty with new OB labs  2. Rubella non-immune status, antepartum Offer MMR PP  3. GAD (generalized anxiety disorder) Sees BH regularly  Preterm labor symptoms and general obstetric precautions including but not limited to vaginal bleeding, contractions, leaking of fluid and fetal movement were reviewed in detail with the patient. Please refer to After Visit Summary for other counseling recommendations.  Return in 4 weeks (on 02/22/2022) for Dyad patient, ob visit.   Clarnce Flock, MD

## 2022-01-25 NOTE — Patient Instructions (Signed)

## 2022-01-30 ENCOUNTER — Ambulatory Visit (INDEPENDENT_AMBULATORY_CARE_PROVIDER_SITE_OTHER): Payer: No Typology Code available for payment source | Admitting: Licensed Clinical Social Worker

## 2022-01-30 ENCOUNTER — Encounter (HOSPITAL_COMMUNITY): Payer: Self-pay | Admitting: Licensed Clinical Social Worker

## 2022-01-30 DIAGNOSIS — F411 Generalized anxiety disorder: Secondary | ICD-10-CM | POA: Diagnosis not present

## 2022-01-30 NOTE — Progress Notes (Addendum)
Virtual Visit via Video Note  I connected with Cindy Jones on 01/30/22 at 1:00pm EST by a video enabled telemedicine application and verified that I am speaking with the correct person using two identifiers.   I discussed the limitations of evaluation and management by telemedicine and the availability of in person appointments. The patient expressed understanding and agreed to proceed.  LOCATION: Patient: Home Provider: Home Office  History of Present Illness: Pt was referred to therapy by her psychiatrist at [facility] for anxiety and panic attacks.   Participation Level: Active   Type of Therapy: Virtual Video individual therapy   Treatment Goal Addressed: Pt will verbalize an understanding of how thoughts, physical feelings, and behavioral actions contribute to anxiety AEB self report  Interventions: CBT/Supportive/Psychoeducation  Summary: Patient presented for today's session sick with a cold/sinus and morning sickness on time and was alert, oriented x5, with no evidence or self-report of SI/HI or A/V H. Clinician inquired about patient's current emotional ratings, as well as any significant changes in thoughts, feelings or behavior since previous session. Patient's ratings 2/10 for depression, 2/10 for anxiety, 2/10 for anger/irritability, and denied any panic attacks since last session. "My husband and I are getting along better." Clinician utilized CBT to process pregnancy concerns and work challenges. Clinician processed thoughts, feelings, and behaviors. Clinician discussed coping skills Cln provided an update on family, work, stressors, health, pregnancy journey. Cln provided education on grounding techniques and positive visualization.   PLAN:  Counselor will be on FMLA for 4-6 weeks. This is the last session with patient until I return. Cln and pt discussed having a temporary therapist while clin is out. "I choose to not have an interim therapist while she is on FMLA. Cln gave pt  information for emergency situations. [place], [place], ED.     PLAN: therapy and anxiety notebooks, takeaways, goals   Collaboration of Care: Other: Continue to work with psychiatrist for medication management  Patient/Guardian was advised Release of Information must be obtained prior to any record release in order to collaborate their care with an outside provider. Patient/Guardian was advised if they have not already done so to contact the registration department to sign all necessary forms in order for [place] to release information regarding their care.   Consent: Patient/Guardian gives verbal consent for treatment and assignment of benefits for services provided during this visit. Patient/Guardian expressed understanding and agreed to proceed.      Assessment and plan: Counselor will continue to meet with patient to address treatment plan goals. Patient will continue to follow recommendations of providers and implement skills learned in session.  Suicidal/Homicidal: Nowithout intent/plan  Therapist Response: Assessed pt's current functioning and reviewed progress.. Assisted pt processing stressors, negative thoughts.  Assisted pt processing for the management of her stressors.  Participation Level: Active  Diagnosis: Axis I.:  GAD    Follow Up Instructions: I discussed the assessment and treatment plan with the patient. The patient was provided an opportunity to ask questions and all were answered. The patient agreed with the plan and demonstrated an understanding of the instructions.   The patient was advised to call back or seek an in-person evaluation if the symptoms worsen or if the condition fails to improve as anticipated.  I provided 60 minutes of non-face-to-face time during this encounter.   Arrington Bencomo S, LCAS

## 2022-02-09 ENCOUNTER — Telehealth (HOSPITAL_BASED_OUTPATIENT_CLINIC_OR_DEPARTMENT_OTHER): Payer: No Typology Code available for payment source | Admitting: Psychiatry

## 2022-02-09 DIAGNOSIS — F952 Tourette's disorder: Secondary | ICD-10-CM

## 2022-02-09 DIAGNOSIS — F411 Generalized anxiety disorder: Secondary | ICD-10-CM | POA: Diagnosis not present

## 2022-02-09 DIAGNOSIS — F988 Other specified behavioral and emotional disorders with onset usually occurring in childhood and adolescence: Secondary | ICD-10-CM

## 2022-02-09 NOTE — Progress Notes (Signed)
Virtual Visit via Video Note  I connected with Cindy Jones on 02/09/22 at  3:30 PM EDT by   a video enabled telemedicine application and verified that I am speaking with the correct person using two identifiers.  Location: Patient: Home Provider: office   I discussed the limitations of evaluation and management by telemedicine and the availability of in person appointments. The patient expressed understanding and agreed to proceed.  History of Present Illness: Cindy Jones is doing well. . She has been following up with her OB regularly. She is currently 6 months pregnant. Her concentration is a little better but is bad in general. Her tics have picked up and she notices them daily. The ones in her eyes bother her in the late afternoon. She is sleeping about 10 hrs/night. She is not napping. Cindy Jones denies depression. She denies SI/HI. Cindy Jones has on/off anxiety about the pregnancy and delivery and baby. She states it is not overwhelming or interfering with anything. At work she has anxiety with starting new things. She is making due as best she can. She is eating 3 meals/day and here OB is monitoring her weight and she is gaining appropriately.    Observations/Objective: Psychiatric Specialty Exam: ROS  Last menstrual period 08/09/2021.There is no height or weight on file to calculate BMI.  General Appearance: Casual and Neat  Eye Contact:  Good  Speech:  Clear and Coherent and Normal Rate  Volume:  Normal  Mood:  Euthymic  Affect:  Full Range  Thought Process:  Coherent and Descriptions of Associations: Circumstantial  Orientation:  Full (Time, Place, and Person)  Thought Content:  Logical  Suicidal Thoughts:  No  Homicidal Thoughts:  No  Memory:  Immediate;   Good  Judgement:  Good  Insight:  Good  Psychomotor Activity:  Normal  Concentration:  Concentration: Fair  Recall:  [place] of Knowledge:  Good  Language:  Good  Akathisia:  No  Handed:  Right  AIMS (if indicated):      Assets:  Communication Skills Desire for Improvement Financial Resources/Insurance Housing Intimacy Leisure Time Physical Health Resilience Social Support Talents/Skills Transportation Vocational/Educational  ADL's:  Intact  Cognition:  WNL  Sleep:        Assessment and Plan:     02/09/2022    3:39 PM 01/25/2022   12:28 PM 12/29/2021    4:14 PM 11/30/2021   11:42 AM 11/02/2021   10:17 AM  Depression screen PHQ 2/9  Decreased Interest 0 1 1 1  0  Down, Depressed, Hopeless 0 0 0 1 0  PHQ - 2 Score 0 1 1 2  0  Altered sleeping 0 0 1 1 1   Tired, decreased energy 3 1 1 2 1   Change in appetite 0 1 0 0 0  Feeling bad or failure about yourself  0 1 1 1 1   Trouble concentrating 3 1 2 1 1   Moving slowly or fidgety/restless 0 0 0 0 0  Suicidal thoughts 0 0 0 0 0  PHQ-9 Score 6 5 6 7 4   Difficult doing work/chores  Not difficult at all Not difficult at all Not difficult at all Not difficult at all    Flowsheet Row Video Visit from 02/09/2022 in [facility] Admission (Discharged) from 12/17/2021 in [facility] Admission (Discharged) from 10/15/2021 in [place] No Risk No Risk No Risk        Cindy Jones is due May 16, 2022.   Status of current problems: ongoing ADHD symptoms  Meds:  1. ADD (attention deficit disorder) without hyperactivity  2. Tourette disease  3. GAD (generalized anxiety disorder)     Labs: none    Therapy: brief supportive therapy provided. Discussed psychosocial stressors in detail.      Collaboration of Care: Other OB  Patient/Guardian was advised Release of Information must be obtained prior to any record release in order to collaborate their care with an outside provider. Patient/Guardian was advised if they have not already done so to contact the registration department to sign all necessary forms in order for [place] to release information  regarding their care.   Consent: Patient/Guardian gives verbal consent for treatment and assignment of benefits for services provided during this visit. Patient/Guardian expressed understanding and agreed to proceed.     Follow Up Instructions: Follow up in 3-4 months or sooner if needed    I discussed the assessment and treatment plan with the patient. The patient was provided an opportunity to ask questions and all were answered. The patient agreed with the plan and demonstrated an understanding of the instructions.   The patient was advised to call back or seek an in-person evaluation if the symptoms worsen or if the condition fails to improve as anticipated.  I provided 10 minutes of non-face-to-face time during this encounter.   Oletta Darter, MD

## 2022-02-13 ENCOUNTER — Telehealth: Payer: Self-pay

## 2022-02-13 ENCOUNTER — Encounter: Payer: Self-pay | Admitting: Family Medicine

## 2022-02-13 NOTE — Telephone Encounter (Signed)
error 

## 2022-02-22 ENCOUNTER — Encounter: Payer: Self-pay | Admitting: Family Medicine

## 2022-02-22 ENCOUNTER — Ambulatory Visit (INDEPENDENT_AMBULATORY_CARE_PROVIDER_SITE_OTHER): Payer: No Typology Code available for payment source | Admitting: Family Medicine

## 2022-02-22 ENCOUNTER — Other Ambulatory Visit: Payer: Self-pay

## 2022-02-22 ENCOUNTER — Other Ambulatory Visit: Payer: Self-pay | Admitting: General Practice

## 2022-02-22 ENCOUNTER — Other Ambulatory Visit: Payer: No Typology Code available for payment source

## 2022-02-22 VITALS — BP 128/85 | HR 87 | Wt 227.6 lb

## 2022-02-22 DIAGNOSIS — Z2839 Other underimmunization status: Secondary | ICD-10-CM

## 2022-02-22 DIAGNOSIS — Z23 Encounter for immunization: Secondary | ICD-10-CM

## 2022-02-22 DIAGNOSIS — Z3480 Encounter for supervision of other normal pregnancy, unspecified trimester: Secondary | ICD-10-CM

## 2022-02-22 DIAGNOSIS — O09899 Supervision of other high risk pregnancies, unspecified trimester: Secondary | ICD-10-CM

## 2022-02-22 DIAGNOSIS — F411 Generalized anxiety disorder: Secondary | ICD-10-CM

## 2022-02-22 NOTE — Patient Instructions (Signed)

## 2022-02-22 NOTE — Progress Notes (Signed)
   Subjective:  Cindy Jones is a 31 y.o. G2P0010 at [redacted]w[redacted]d being seen today for ongoing prenatal care.  She is currently monitored for the following issues for this low-risk pregnancy and has Tourette disease; ADD (attention deficit disorder); Bulimia; Overweight (BMI 25.0-29.9); GAD (generalized anxiety disorder); Exercise-induced asthma; Urticaria; Other adverse food reactions, not elsewhere classified, subsequent encounter; Other allergic rhinitis; History of asthma; Supervision of other normal pregnancy, antepartum; Major depressive disorder, single episode, in full remission ([place]); and Rubella non-immune status, antepartum on their problem list.  Patient reports no complaints.  Contractions: Not present. Vag. Bleeding: None.  Movement: Present. Denies leaking of fluid.   The following portions of the patient's history were reviewed and updated as appropriate: allergies, current medications, past family history, past medical history, past social history, past surgical history and problem list. Problem list updated.  Objective:   Vitals:   02/22/22 0904  BP: 128/85  Pulse: 87  Weight: 227 lb 9.6 oz (103.2 kg)    Fetal Status: Fetal Heart Rate (bpm): 138   Movement: Present     General:  Alert, oriented and cooperative. Patient is in no acute distress.  Skin: Skin is warm and dry. No rash noted.   Cardiovascular: Normal heart rate noted  Respiratory: Normal respiratory effort, no problems with respiration noted  Abdomen: Soft, gravid, appropriate for gestational age. Pain/Pressure: Present     Pelvic: Vag. Bleeding: None     Cervical exam deferred        Extremities: Normal range of motion.     Mental Status: Normal mood and affect. Normal behavior. Normal judgment and thought content.   Urinalysis:      Assessment and Plan:  Pregnancy: G2P0010 at [redacted]w[redacted]d  1. Supervision of other normal pregnancy, antepartum BP and FHR normal 28 wk labs today, had some black coffee this morning,  should not affect the results of the test Tdap given Flu received previously at another location  2. Rubella non-immune status, antepartum Offer MMR PP  3. GAD (generalized anxiety disorder) Follows with psychiatry  Preterm labor symptoms and general obstetric precautions including but not limited to vaginal bleeding, contractions, leaking of fluid and fetal movement were reviewed in detail with the patient. Please refer to After Visit Summary for other counseling recommendations.  Return in 2 weeks (on 03/08/2022) for Dyad patient, ob visit.   Clarnce Flock, MD

## 2022-02-23 LAB — RPR: RPR Ser Ql: NONREACTIVE

## 2022-02-23 LAB — GLUCOSE TOLERANCE, 2 HOURS W/ 1HR
Glucose, 1 hour: 101 mg/dL (ref 70–179)
Glucose, 2 hour: 94 mg/dL (ref 70–152)
Glucose, Fasting: 69 mg/dL — ABNORMAL LOW (ref 70–91)

## 2022-03-03 ENCOUNTER — Ambulatory Visit: Payer: No Typology Code available for payment source | Admitting: *Deleted

## 2022-03-03 ENCOUNTER — Ambulatory Visit: Payer: No Typology Code available for payment source | Attending: Obstetrics

## 2022-03-03 ENCOUNTER — Other Ambulatory Visit: Payer: Self-pay | Admitting: *Deleted

## 2022-03-03 VITALS — BP 100/59 | HR 69

## 2022-03-03 DIAGNOSIS — O09893 Supervision of other high risk pregnancies, third trimester: Secondary | ICD-10-CM

## 2022-03-03 DIAGNOSIS — O09891 Supervision of other high risk pregnancies, first trimester: Secondary | ICD-10-CM | POA: Insufficient documentation

## 2022-03-03 DIAGNOSIS — Z3A29 29 weeks gestation of pregnancy: Secondary | ICD-10-CM | POA: Diagnosis not present

## 2022-03-03 DIAGNOSIS — Z3689 Encounter for other specified antenatal screening: Secondary | ICD-10-CM | POA: Diagnosis present

## 2022-03-03 DIAGNOSIS — O99213 Obesity complicating pregnancy, third trimester: Secondary | ICD-10-CM | POA: Diagnosis not present

## 2022-03-03 DIAGNOSIS — E669 Obesity, unspecified: Secondary | ICD-10-CM

## 2022-03-07 ENCOUNTER — Ambulatory Visit: Payer: No Typology Code available for payment source | Attending: Cardiology | Admitting: Cardiology

## 2022-03-07 ENCOUNTER — Encounter: Payer: Self-pay | Admitting: Cardiology

## 2022-03-07 VITALS — BP 100/62 | HR 83 | Ht 66.0 in | Wt 229.4 lb

## 2022-03-07 DIAGNOSIS — D509 Iron deficiency anemia, unspecified: Secondary | ICD-10-CM

## 2022-03-07 DIAGNOSIS — E569 Vitamin deficiency, unspecified: Secondary | ICD-10-CM

## 2022-03-07 NOTE — Progress Notes (Signed)
Cardio-Obstetrics Clinic  Follow Up Note   Date:  03/07/2022   ID:  Cindy Jones, DOB [birth year], MRN [id number]  PCP:  Natalia Leatherwood, DO   [facility] Providers Cardiologist:  Thomasene Ripple, DO  Electrophysiologist:  None        Referring MD: Reva Bores, MD   Chief Complaint: " I am doing well but feel little more tired and lightheaded"  History of Present Illness:    Cindy Jones is a 31 y.o. female [G2P0010] who returns for follow up of syncope. Medical history includes vitamin D deficiency, posture orthostatic syndrome. I saw the patient in July 2023 at that time she was experiencing some lightheadedness.  She had gotten the echocardiogram which I reviewed. Because of her symptoms I placed a monitor on the patient. I have since discussed her monitor result with the patient via MyChart.   Prior CV Studies Reviewed: The following studies were reviewed today:  Expand All Collapse All   Cardio-Obstetrics Clinic   New Evaluation   Date:  11/15/2021    ID:  Cindy Jones, DOB 09-Oct-1990, MRN [id number]   PCP:  Reva Bores, MD              [facility] Providers Cardiologist:  Thomasene Ripple, DO  Electrophysiologist:  None        Referring MD: Natalia Leatherwood, DO    Chief Complaint: " I am ok"   History of Present Illness:     Cindy Jones is a 31 y.o. female [G2P0010] who is being seen today for the evaluation of lightheadedness at the request of Kuneff, Renee A, DO.  Medical history includes previous recurrent syncope which occurred mainly in February 2022, vitamin D deficiency suspected postural orthostatic tachycardia syndrome here today to be evaluated in the cardiac status clinic due to significant lightheadedness.     Prior CV Studies Reviewed: The following studies were reviewed today:  ZIO monitor December 02, 2021 Patient had a min HR of 55 bpm, max HR of 167 bpm, and avg HR of 81 bpm. Predominant underlying rhythm was Sinus Rhythm.  1 run of Supraventricular Tachycardia occurred lasting 4 beats with a max rate of 129 bpm (avg 123 bpm). Isolated SVEs were rare  (<1.0%), and no SVE Couplets or SVE Triplets were present. Isolated VEs were rare (<1.0%), and no VE Couplets or VE Triplets were present.    Conclusion: Rare asymptomatic paroxysmal supraventricular tachycardia.   TTE 07/29/2020  1. Prominent apical and lateral LV wall trabeculations . Left ventricular ejection fraction, by estimation, is 60 to 65%. The left ventricle has  normal function. The left ventricle has no regional wall motion abnormalities. Left ventricular diastolic  parameters were normal.   2. Right ventricular systolic function is normal. The right ventricular size is normal.   3. The mitral valve is normal in structure. Trivial mitral valve regurgitation. No evidence of mitral stenosis.   4. The aortic valve is tricuspid. Aortic valve regurgitation is trivial. No aortic stenosis is present.   5. The inferior vena cava is normal in size with greater than 50% respiratory variability, suggesting right atrial pressure of 3 mmHg.    Zio 07/28/2020 Impression: 1. No arrhythmias detected.  2. Rare ectopy.      Past Medical History:  Diagnosis Date   Anxiety    Asthma    as a child   Binge eating disorder    Dysrhythmia    " I  feel it about once a month"   Frequent headaches    GAD (generalized anxiety disorder)    GERD (gastroesophageal reflux disease)    History of acute PID 06/09/2019   Major depression in complete remission (HCC)    Migraines    PID (acute pelvic inflammatory disease)    Seasonal allergies    Tourette disorder    Urinary tract bacterial infections     Past Surgical History:  Procedure Laterality Date   CHOLECYSTECTOMY     ESOPHAGOGASTRODUODENOSCOPY     LAPAROSCOPIC CHOLECYSTECTOMY SINGLE SITE WITH INTRAOPERATIVE CHOLANGIOGRAM N/A 03/15/2016   Procedure: LAPAROSCOPIC CHOLECYSTECTOMY SINGLE SITE WITH INTRAOPERATIVE  CHOLANGIOGRAM;  Surgeon: Karie Soda, MD;  Location: [facility] OR;  Service: General;  Laterality: N/A;   TONSILLECTOMY AND ADENOIDECTOMY  2013   WISDOM TOOTH EXTRACTION        OB History     Gravida  2   Para  0   Term  0   Preterm  0   AB  1   Living  0      SAB  1   IAB  0   Ectopic  0   Multiple  0   Live Births                  Current Medications: Current Meds  Medication Sig   acetaminophen (TYLENOL) 325 MG tablet Take 650 mg by mouth every 6 (six) hours as needed.   cetirizine (ZYRTEC ALLERGY) 10 MG tablet Take 1 tablet (10 mg total) by mouth 2 (two) times daily.   diphenhydrAMINE (BENADRYL) 25 MG tablet Take 25 mg by mouth every 6 (six) hours as needed for allergies or sleep.   FOLIC ACID PO Take 1 tablet by mouth daily.   magnesium 30 MG tablet Take 30 mg by mouth 2 (two) times daily.   Prenatal Vit-Fe Fumarate-FA (PRENATAL MULTIVITAMIN) TABS tablet Take 1 tablet by mouth daily at 12 noon.   promethazine (PHENERGAN) 12.5 MG tablet Take 12.5 mg by mouth every 8 (eight) hours as needed for nausea or vomiting.     Allergies:   Latex, Barley grass, Cranberry juice powder, Naproxen, Nickel, and Sulfa antibiotics   Social History   Socioeconomic History   Marital status: Married    Spouse name: Not on file   Number of children: 0   Years of education: Not on file   Highest education level: Not on file  Occupational History   Occupation: Systems developer porfolio  Tobacco Use   Smoking status: Never   Smokeless tobacco: Never  Vaping Use   Vaping Use: Never used  Substance and Sexual Activity   Alcohol use: Not Currently    Alcohol/week: 1.0 standard drink of alcohol    Types: 1 Glasses of wine per week    Comment: occ   Drug use: No   Sexual activity: Yes    Partners: Male    Birth control/protection: None    Comment: Married  Other Topics Concern   Not on file  Social History Narrative   - Married, no children.   - Automotive engineer education.   - She  works FT as a Patent attorney.   - Her aunt & uncle were her guardians when she was in HS. They live in [place], [place].    - She has a fraternal twin sister.    - Wears her seatbelt, smoke detectors in the home.   Right handed   Social Determinants of Health   Financial  Resource Strain: Not on file  Food Insecurity: No Food Insecurity (11/30/2021)   Hunger Vital Sign    Worried About Running Out of Food in the Last Year: Never true    Ran Out of Food in the Last Year: Never true  Transportation Needs: No Transportation Needs (11/30/2021)   PRAPARE - Administrator, Civil Service (Medical): No    Lack of Transportation (Non-Medical): No  Physical Activity: Not on file  Stress: Not on file  Social Connections: Not on file      Family History  Problem Relation Age of Onset   Alcohol abuse Mother    Bipolar disorder Mother    Cancer Father        sarcoma; passed away when pt was 8   Asthma Sister    Allergic rhinitis Sister    Eczema Sister    Emphysema Maternal Grandmother    Tourette syndrome Maternal Grandfather    Emphysema Paternal Grandmother    Heart disease Paternal Grandfather    Tourette syndrome Maternal Aunt    Alcohol abuse Maternal Aunt    Alcohol abuse Maternal Uncle    Hyperlipidemia Paternal Aunt    Hashimoto's thyroiditis Paternal Aunt    Suicidality Neg Hx    Urticaria Neg Hx    Angioedema Neg Hx       ROS:   Please see the history of present illness.     All other systems reviewed and are negative.   Labs/EKG Reviewed:    EKG:   EKG is was not ordered today.    Recent Labs: 10/15/2021: ALT 15; BUN 7; Creatinine, Ser 0.57; Potassium 4.2; Sodium 138 10/21/2021: Hemoglobin 11.4; Platelets 174   Recent Lipid Panel Lab Results  Component Value Date/Time   CHOL 124 10/22/2020 10:36 AM   TRIG 51.0 10/22/2020 10:36 AM   HDL 48.60 10/22/2020 10:36 AM   CHOLHDL 3 10/22/2020 10:36 AM   LDLCALC 65 10/22/2020 10:36 AM   LDLCALC 48  10/27/2019 01:57 PM   LDLDIRECT 53.0 07/14/2016 08:33 AM    Physical Exam:    VS:  BP 100/62   Pulse 83   Ht 5\' 6"  (1.676 m)   Wt 229 lb 6.4 oz (104.1 kg)   LMP 08/09/2021 (Exact Date)   SpO2 99%   BMI 37.03 kg/m     Wt Readings from Last 3 Encounters:  03/07/22 229 lb 6.4 oz (104.1 kg)  02/22/22 227 lb 9.6 oz (103.2 kg)  01/25/22 223 lb 12.8 oz (101.5 kg)     GEN:  Well nourished, well developed in no acute distress HEENT: Normal NECK: No JVD; No carotid bruits LYMPHATICS: No lymphadenopathy CARDIAC: RRR, no murmurs, rubs, gallops RESPIRATORY:  Clear to auscultation without rales, wheezing or rhonchi  ABDOMEN: Soft, non-tender, non-distended MUSCULOSKELETAL:  No edema; No deformity  SKIN: Warm and dry NEUROLOGIC:  Alert and oriented x 3 PSYCHIATRIC:  Normal affect    Risk Assessment/Risk Calculators:     CARPREG II Risk Prediction Index Score:  1.  The patient's risk for a primary cardiac event is 5%.            ASSESSMENT & PLAN:    Syncope  Fatigue Lightheadedness  No recurrent syncope.  Symptoms are not bothersome but the new symptoms of fatigue and lightheadedness I really would like to get her iron panel to make sure that she is not experiencing worsening iron deficiency anemia.  Vitamin D will be obtained as well.   Patient  Instructions  Medication Instructions:  Your physician recommends that you continue on your current medications as directed. Please refer to the Current Medication list given to you today.  *If you need a refill on your cardiac medications before your next appointment, please call your pharmacy*   Lab Work: Your physician recommends that you have the following labs drawn today: Iron and Ferritin panel and Vitamin D levels  If you have labs (blood work) drawn today and your tests are completely normal, you will receive your results only by: [place] (if you have MyChart) OR A paper copy in the mail If you have any lab  test that is abnormal or we need to change your treatment, we will call you to review the results.   Testing/Procedures: NONE   Follow-Up: At [facility], you and your health needs are our priority.  As part of our continuing mission to provide you with exceptional heart care, we have created designated Provider Care Teams.  These Care Teams include your primary Cardiologist (physician) and Advanced Practice Providers (APPs -  Physician Assistants and Nurse Practitioners) who all work together to provide you with the care you need, when you need it.  We recommend signing up for the patient portal called "MyChart".  Sign up information is provided on this After Visit Summary.  MyChart is used to connect with patients for Virtual Visits (Telemedicine).  Patients are able to view lab/test results, encounter notes, upcoming appointments, etc.  Non-urgent messages can be sent to your provider as well.   To learn more about what you can do with MyChart, go to [email or web address].    Your next appointment:   12 week(s)  The format for your next appointment:   Virtual Visit   Provider:   Berniece Salines, DO     Dispo:  Return in about 12 weeks (around 05/30/2022).   Medication Adjustments/Labs and Tests Ordered: Current medicines are reviewed at length with the patient today.  Concerns regarding medicines are outlined above.  Tests Ordered: Orders Placed This Encounter  Procedures   Iron, TIBC and Ferritin Panel   VITAMIN D 25 Hydroxy (Vit-D Deficiency, Fractures)   Medication Changes: No orders of the defined types were placed in this encounter.

## 2022-03-07 NOTE — Patient Instructions (Signed)
Medication Instructions:  Your physician recommends that you continue on your current medications as directed. Please refer to the Current Medication list given to you today.  *If you need a refill on your cardiac medications before your next appointment, please call your pharmacy*   Lab Work: Your physician recommends that you have the following labs drawn today: Iron and Ferritin panel and Vitamin D levels  If you have labs (blood work) drawn today and your tests are completely normal, you will receive your results only by: [place] (if you have MyChart) OR A paper copy in the mail If you have any lab test that is abnormal or we need to change your treatment, we will call you to review the results.   Testing/Procedures: NONE   Follow-Up: At [facility], you and your health needs are our priority.  As part of our continuing mission to provide you with exceptional heart care, we have created designated Provider Care Teams.  These Care Teams include your primary Cardiologist (physician) and Advanced Practice Providers (APPs -  Physician Assistants and Nurse Practitioners) who all work together to provide you with the care you need, when you need it.  We recommend signing up for the patient portal called "MyChart".  Sign up information is provided on this After Visit Summary.  MyChart is used to connect with patients for Virtual Visits (Telemedicine).  Patients are able to view lab/test results, encounter notes, upcoming appointments, etc.  Non-urgent messages can be sent to your provider as well.   To learn more about what you can do with MyChart, go to [email or web address].    Your next appointment:   12 week(s)  The format for your next appointment:   Virtual Visit   Provider:   Berniece Salines, DO

## 2022-03-08 ENCOUNTER — Encounter: Payer: Self-pay | Admitting: Family Medicine

## 2022-03-08 LAB — IRON,TIBC AND FERRITIN PANEL
Ferritin: 7 ng/mL — ABNORMAL LOW (ref 15–150)
Iron Saturation: 12 % — ABNORMAL LOW (ref 15–55)
Iron: 47 ug/dL (ref 27–159)
Total Iron Binding Capacity: 386 ug/dL (ref 250–450)
UIBC: 339 ug/dL (ref 131–425)

## 2022-03-08 LAB — VITAMIN D 25 HYDROXY (VIT D DEFICIENCY, FRACTURES): Vit D, 25-Hydroxy: 43.3 ng/mL (ref 30.0–100.0)

## 2022-03-10 ENCOUNTER — Encounter: Payer: Self-pay | Admitting: Family Medicine

## 2022-03-10 ENCOUNTER — Ambulatory Visit (INDEPENDENT_AMBULATORY_CARE_PROVIDER_SITE_OTHER): Payer: No Typology Code available for payment source | Admitting: Family Medicine

## 2022-03-10 ENCOUNTER — Other Ambulatory Visit: Payer: Self-pay

## 2022-03-10 VITALS — BP 106/72 | HR 83 | Wt 228.7 lb

## 2022-03-10 DIAGNOSIS — F411 Generalized anxiety disorder: Secondary | ICD-10-CM

## 2022-03-10 DIAGNOSIS — Z2839 Other underimmunization status: Secondary | ICD-10-CM

## 2022-03-10 DIAGNOSIS — Z3480 Encounter for supervision of other normal pregnancy, unspecified trimester: Secondary | ICD-10-CM

## 2022-03-10 DIAGNOSIS — O09899 Supervision of other high risk pregnancies, unspecified trimester: Secondary | ICD-10-CM

## 2022-03-10 NOTE — Progress Notes (Signed)
   Subjective:  Cindy Jones is a 31 y.o. G2P0010 at 37w3dbeing seen today for ongoing prenatal care.  She is currently monitored for the following issues for this low-risk pregnancy and has Tourette disease; ADD (attention deficit disorder); Bulimia; Overweight (BMI 25.0-29.9); GAD (generalized anxiety disorder); Exercise-induced asthma; Urticaria; Other adverse food reactions, not elsewhere classified, subsequent encounter; Other allergic rhinitis; History of asthma; Supervision of other normal pregnancy, antepartum; Major depressive disorder, single episode, in full remission ([place]; and Rubella non-immune status, antepartum on their problem list.  Patient reports no complaints.  Contractions: Not present.  .  Movement: Present. Denies leaking of fluid.   The following portions of the patient's history were reviewed and updated as appropriate: allergies, current medications, past family history, past medical history, past social history, past surgical history and problem list. Problem list updated.  Objective:   Vitals:   03/10/22 0913  BP: 106/72  Pulse: 83  Weight: 228 lb 11.2 oz (103.7 kg)    Fetal Status: Fetal Heart Rate (bpm): 133   Movement: Present     General:  Alert, oriented and cooperative. Patient is in no acute distress.  Skin: Skin is warm and dry. No rash noted.   Cardiovascular: Normal heart rate noted  Respiratory: Normal respiratory effort, no problems with respiration noted  Abdomen: Soft, gravid, appropriate for gestational age. Pain/Pressure: Present     Pelvic:       Cervical exam deferred        Extremities: Normal range of motion.     Mental Status: Normal mood and affect. Normal behavior. Normal judgment and thought content.   Urinalysis:      Assessment and Plan:  Pregnancy: G2P0010 at 341w3d1. Supervision of other normal pregnancy, antepartum BP and FHR normal CBC and HIV not yet collected, done today Work note provided  2. Rubella non-immune  status, antepartum Offer MMR PP  3. GAD (generalized anxiety disorder) Follows w Psychiatry but fired her therapist, interested in seeing someone new Referral placed for JaRoselyn Reefto schedule at checkout  Preterm labor symptoms and general obstetric precautions including but not limited to vaginal bleeding, contractions, leaking of fluid and fetal movement were reviewed in detail with the patient. Please refer to After Visit Summary for other counseling recommendations.  Return in 2 weeks (on 03/24/2022) for Dyad patient, ob visit.   EcClarnce FlockMD

## 2022-03-10 NOTE — Patient Instructions (Signed)

## 2022-03-11 LAB — CBC
Hematocrit: 32.5 % — ABNORMAL LOW (ref 34.0–46.6)
Hemoglobin: 11.1 g/dL (ref 11.1–15.9)
MCH: 31.9 pg (ref 26.6–33.0)
MCHC: 34.2 g/dL (ref 31.5–35.7)
MCV: 93 fL (ref 79–97)
Platelets: 246 10*3/uL (ref 150–450)
RBC: 3.48 x10E6/uL — ABNORMAL LOW (ref 3.77–5.28)
RDW: 12 % (ref 11.7–15.4)
WBC: 8.5 10*3/uL (ref 3.4–10.8)

## 2022-03-11 LAB — HIV ANTIBODY (ROUTINE TESTING W REFLEX): HIV Screen 4th Generation wRfx: NONREACTIVE

## 2022-03-13 NOTE — BH Specialist Note (Unsigned)
Integrated Behavioral Health via Telemedicine Visit  03/14/2022 Cindy Jones [id number]  Number of Integrated Behavioral Health Clinician visits: 1- Initial Visit  Session Start time: [phone]   Session End time: 0922  Total time in minutes: 36   Referring Provider: Merian Capron, MD Cindy/Family location: Home [facility] Provider location: [facility] for [facility]  All persons participating in visit: Cindy Jones and [name]   Types of Service: Individual psychotherapy and Video visit  I connected with [name] and/or [name]'s  n/a  via  Telephone or Video Enabled Telemedicine Application  (Video is Caregility application) and verified that I am speaking with the correct person using two identifiers. Discussed confidentiality: Yes   I discussed the limitations of telemedicine and the availability of in person appointments.  Discussed there is a possibility of technology failure and discussed alternative modes of communication if that failure occurs.  I discussed that engaging in this telemedicine visit, they consent to the provision of behavioral healthcare and the services will be billed under their insurance.  Cindy and/or legal guardian expressed understanding and consented to Telemedicine visit: Yes   Presenting Concerns: Cindy and/or family reports the following symptoms/concerns: Overwhelmed with work and worry about risk of depression postpartum (strong family history of depression; history of eating disorder). Pt also concerned about pelvic pain and uncertainty regarding being induced or not. Pt goal is to prevent escalation of depression and anxiety through pregnancy and postpartum time. Duration of problem: Current pregnancy; history of depression and eating disorder in the past; Severity of problem: mild  Cindy and/or Family's Strengths/Protective Factors: Social connections, Concrete supports  in place (healthy food, safe environments, etc.), Sense of purpose, and Physical Health (exercise, healthy diet, medication compliance, etc.)  Goals Addressed: Cindy will:  Maintain reduction of symptoms of: anxiety and depression   Increase knowledge and/or ability of: healthy habits and stress reduction   Demonstrate ability to: Increase healthy adjustment to current life circumstances and Increase motivation to adhere to plan of care  Progress towards Goals: Ongoing  Interventions: Interventions utilized:  Psychoeducation and/or Health Education and Supportive Reflection Standardized Assessments completed: Not Needed  Cindy and/or Family Response: Cindy agrees with treatment plan.   Assessment: Cindy currently experiencing Generalized anxiety disorder and History of MDD.   Cindy may benefit from psychoeducation and brief therapeutic interventions regarding maintaining reduction of symptoms of depression and anxiety .  Plan: Follow up with behavioral health clinician on : Two weeks Behavioral recommendations:  -Continue taking prenatal vitamin daily as prescribed -Continue using maternity belt for walks; increase usage as needed throughout the day for comfort -Continue prioritizing healthy self-care (regular meals, adequate rest) -Continue plan to attend upcoming childbirth class with husband in December 2023 -Read through After Visit Summary; use information as needed  -Discuss with medical provider on 03/28/22 concerning pelvic pain (ex. Would physical therapy be helpful? Any other recommendations?).   Referral(s): Integrated [employer] (In Clinic)  I discussed the assessment and treatment plan with the Cindy and/or parent/guardian. They were provided an opportunity to ask questions and all were answered. They agreed with the plan and demonstrated an understanding of the instructions.   They were advised to call back or seek an in-person  evaluation if the symptoms worsen or if the condition fails to improve as anticipated.  Rae Lips, LCSW     02/22/2022   11:38 AM 02/09/2022    3:39 PM 01/25/2022  12:28 PM 12/29/2021    4:14 PM 11/30/2021   11:42 AM  Depression screen PHQ 2/9  Decreased Interest 0  1 1 1   Down, Depressed, Hopeless 0  0 0 1  PHQ - 2 Score 0  1 1 2   Altered sleeping 0  0 1 1  Tired, decreased energy 1  1 1 2   Change in appetite 0  1 0 0  Feeling bad or failure about yourself  1  1 1 1   Trouble concentrating 1  1 2 1   Moving slowly or fidgety/restless 0  0 0 0  Suicidal thoughts 0  0 0 0  PHQ-9 Score 3  5 6 7   Difficult doing work/chores   Not difficult at all Not difficult at all Not difficult at all     Information is confidential and restricted. Go to Review Flowsheets to unlock data.      02/22/2022   11:38 AM 01/25/2022   12:29 PM 12/29/2021    4:14 PM 11/30/2021   11:42 AM  GAD 7 : Generalized Anxiety Score  Nervous, Anxious, on Edge 1 2 1 1   Control/stop worrying 0 1 1 1   Worry too much - different things 0 1 0 1  Trouble relaxing 1 1 1 1   Restless 0 1 1 0  Easily annoyed or irritable 1 0 0 2  Afraid - awful might happen 0 0 1 1  Total GAD 7 Score 3 6 5 7   Anxiety Difficulty  Not difficult at all

## 2022-03-14 ENCOUNTER — Ambulatory Visit (INDEPENDENT_AMBULATORY_CARE_PROVIDER_SITE_OTHER): Payer: No Typology Code available for payment source | Admitting: Clinical

## 2022-03-14 DIAGNOSIS — Z8659 Personal history of other mental and behavioral disorders: Secondary | ICD-10-CM

## 2022-03-14 DIAGNOSIS — F411 Generalized anxiety disorder: Secondary | ICD-10-CM | POA: Diagnosis not present

## 2022-03-14 NOTE — Patient Instructions (Addendum)
[facility] [address] [place], [place] [address] [phone] (main office) [phone] (Cagney Degrace's office)  [email or web address]       BRAINSTORMING  Develop a Plan Goals: Provide a way to start conversation about your new life with a baby Assist parents in recognizing and using resources within their reach Help pave the way before birth for an easier period of transition afterwards.  Make a list of the following information to keep in a central location: Full name of Mom and Partner: _____________________________________________ Baby's full name and Date of Birth: ___________________________________________ Home Address: ___________________________________________________________ ________________________________________________________________________ Home Phone: ____________________________________________________________ Parents' cell numbers: _____________________________________________________ ________________________________________________________________________ Name and contact info for OB: ______________________________________________ Name and contact info for Pediatrician:________________________________________ Contact info for Lactation Consultants: ________________________________________  REST and SLEEP *You each need at least 4-5 hours of uninterrupted sleep every day. Write specific names and contact information.* How are you going to rest in the postpartum period? While partner's home? When partner returns to work? When you both return to work? Where will your baby sleep? Who is available to help during the day? Evening? Night? Who could move in for a period to help support you? What are some ideas to help you get enough  sleep? __________________________________________________________________________________________________________________________________________________________________________________________________________________________________________ NUTRITIOUS FOOD AND DRINK *Plan for meals before your baby is born so you can have healthy food to eat during the immediate postpartum period.* Who will look after breakfast? Lunch? Dinner? List names and contact information. Brainstorm quick, healthy ideas for each meal. What can you do before baby is born to prepare meals for the postpartum period? How can others help you with meals? Which grocery stores provide online shopping and delivery? Which restaurants offer take-out or delivery options? ______________________________________________________________________________________________________________________________________________________________________________________________________________________________________________________________________________________________________________________________________________________________________________________________________  CARE FOR MOM *It's important that mom is cared for and pampered in the postpartum period. Remember, the most important ways new mothers need care are: sleep, nutrition, gentle exercise, and time off.* Who can come take care of mom during this period? Make a list of people with their contact information. List some activities that make you feel cared for, rested, and energized? Who can make sure you have opportunities to do these things? Does mom have a space of her very own within your home that's just for her? Make a "Mama Cave" where she can be comfortable, rest, and renew herself  daily. ______________________________________________________________________________________________________________________________________________________________________________________________________________________________________________________________________________________________________________________________________________________________________________________________________    CARE FOR AND FEEDING BABY *Knowledgeable and encouraging people will offer the best support with regard to feeding your baby.* Educate yourself and choose the best feeding option for your baby. Make a list of people who will guide, support, and be a resource for you as your care for and feed your baby. (Friends that have breastfed or are currently breastfeeding, lactation consultants, breastfeeding support groups, etc.) Consider a postpartum doula. (These websites can give you information: dona.org & padanc.org) Seek out local breastfeeding resources like the breastfeeding support group at [facility] or [employer]. ______________________________________________________________________________________________________________________________________________________________________________________________________________________________________________________________________________________________________________________________________________________________________________________________________  CHORES AND ERRANDS Who can help with a thorough cleaning before baby is born? Make a list of people who will help with housekeeping and chores, like laundry, light cleaning, dishes, bathrooms, etc. Who can run some errands for you? What can you do to make sure you are stocked with basic supplies before baby is born? Who is going to do the  shopping? ______________________________________________________________________________________________________________________________________________________________________________________________________________________________________________________________________________________________________________________________________________________________________________________________________     Family Adjustment *Nurture yourselves.it helps parents be more loving and allows for better bonding with their child.* What sorts of things do   doing together? Which activities help you to connect and strengthen your relationship? Make a list of those things. Make a list of people whom you trust to care for your baby so you can have some time together as a couple. What types of things help partner feel connected to Mom? Make a list. What needs will partner have in order to bond with baby? Other children? Who will care for them when you go into labor and while you are in the hospital? Think about what the needs of your older children might be. Who can help you meet those needs? In what ways are you helping them prepare for bringing baby home? List some specific strategies you have for family adjustment. _______________________________________________________________________________________________________________________________________________________________________________________________________________________________________________________________________________________________________________________________________________  SUPPORT *Someone who can empathize with experiences normalizes your problems and makes them more bearable.* Make a list of other friends, neighbors, and/or co-workers you know with infants (and small children, if applicable) with whom you can connect. Make a list of local or online support groups, mom groups, etc. in which you can be  involved. ______________________________________________________________________________________________________________________________________________________________________________________________________________________________________________________________________________________________________________________________________________________________________________________________________  Childcare Plans Investigate and plan for childcare if mom is returning to work. Talk about mom's concerns about her transition back to work. Talk about partner's concerns regarding this transition.  Mental Health *Your mental health is one of the highest priorities for a pregnant or postpartum mom.* 1 in 5 women experience anxiety and/or depression from the time of conception through the first year after birth. Postpartum Mood Disorders are the #1 complication of pregnancy and childbirth and the suffering experienced by these mothers is not necessary! These illnesses are temporary and respond well to treatment, which often includes self-care, social support, talk therapy, and medication when needed. Women experiencing anxiety and depression often say things like: "I'm supposed to be happy.why do I feel so sad?", "Why can't I snap out of it?", "I'm having thoughts that scare me." There is no need to be embarrassed if you are feeling these symptoms: Overwhelmed, anxious, angry, sad, guilty, irritable, hopeless, exhausted but can't sleep You are NOT alone. You are NOT to blame. With help, you WILL be well. Where can I find help? Medical professionals such as your OB, midwife, gynecologist, family practitioner, primary care provider, pediatrician, or mental health providers; [facility] support groups: Feelings After Birth, Breastfeeding Support Group, Baby and Me Group, and Fit 4 Two exercise classes. You have permission to ask for help. It will confirm your feelings, validate your experiences,  share/learn coping strategies, and gain support and encouragement as you heal. You are important! BRAINSTORM Make a list of local resources, including resources for mom and for partner. Identify support groups. Identify people to call late at night - include names and contact info. Talk with partner about perinatal mood and anxiety disorders. Talk with your OB, midwife, and doula about baby blues and about perinatal mood and anxiety disorders. Talk with your pediatrician about perinatal mood and anxiety disorders.   Support & Sanity Savers   What do you really need?  Basics In preparing for a new baby, many expectant parents spend hours shopping for baby clothes, decorating the nursery, and deciding which car seat to buy. Yet most don't think much about what the reality of parenting a newborn will be like, and what they need to make it through that. So, here is the advice of experienced parents. We know you'll read this, and think "they're exaggerating, I don't really need that." Just trust us on these, OK? Plan for all of   this, and if it turns out you don't need it, come back and teach us how you did it!  Must-Haves (Once baby's survival needs are met, make sure you attend to your own survival needs!) Sleep An average newborn sleeps 16-18 hours per day, over 6-7 sleep periods, rarely more than three hours at a time. It is normal and healthy for a newborn to wake throughout the night... but really hard on parents!! Naps. Prioritize sleep above any responsibilities like: cleaning house, visiting friends, running errands, etc.  Sleep whenever baby sleeps. If you can't nap, at least have restful times when baby eats. The more rest you get, the more patient you will be, the more emotionally stable, and better at solving problems.  Food You may not have realized it would be difficult to eat when you have a newborn. Yet, when we talk to countless new parents, they say things like "it may be 2:00 pm  when I realize I haven't had breakfast yet." Or "every time we sit down to dinner, baby needs to eat, and my food gets cold, so I don't bother to eat it." Finger food. Before your baby is born, stock up with one months' worth of food that: 1) you can eat with one hand while holding a baby, 2) doesn't need to be prepped, 3) is good hot or cold, 4) doesn't spoil when left out for a few hours, and 5) you like to eat. Think about: nuts, dried fruit, Clif bars, pretzels, jerky, gogurt, baby carrots, apples, bananas, crackers, cheez-n-crackers, string cheese, hot pockets or frozen burritos to microwave, garden burgers and breakfast pastries to put in the toaster, yogurt drinks, etc. Restaurant Menus. Make lists of your favorite restaurants & menu items. When family/friends want to help, you can give specific information without much thought. They can either bring you the food or send gift cards for just the right meals. Freezer Meals.  Take some time to make a few meals to put in the freezer ahead of time.  Easy to freeze meals can be anything such as soup, lasagna, chicken pie, or spaghetti sauce. Set up a Meal Schedule.  Ask friends and family to sign up to bring you meals during the first few weeks of being home. (It can be passed around at baby showers!) You have no idea how helpful this will be until you are in the throes of parenting.  www.takethemameal.com is a great website to check out. Emotional Support Know who to call when you're stressed out. Parenting a newborn is very challenging work. There are times when it totally overwhelms your normal coping abilities. EVERY NEW PARENT NEEDS TO HAVE A PLAN FOR WHO TO CALL WHEN THEY JUST CAN'T COPE ANY MORE. (And it has to be someone other than the baby's other parent!) Before your baby is born, come up with at least one person you can call for support - write their phone number down and post it on the refrigerator. Anxiety & Sadness. Baby blues are normal after  pregnancy; however, there are more severe types of anxiety & sadness which can occur and should not be ignored.  They are always treatable, but you have to take the first step by reaching out for help. [facility] offers a "Mom Talk" group which meets every Tuesday from 10 am - 11 am.  This group is for new moms who need support and connection after their babies are born.  Call [phone].  Really, Really Helpful (Plan for them!   Make sure these happen often!!) Physical Support with Taking Care of Yourselves Asking friends and family. Before your baby is born, set up a schedule of people who can come and visit and help out (or ask a friend to schedule for you). Any time someone says "let me know what I can do to help," sign them up for a day. When they get there, their job is not to take care of the baby (that's your job and your joy). Their job is to take care of you!  Postpartum doulas. If you don't have anyone you can call on for support, look into postpartum doulas:  professionals at helping parents with caring for baby, caring for themselves, getting breastfeeding started, and helping with household tasks. www.padanc.org is a helpful website for learning about doulas in our area. Peer Support / Parent Groups Why: One of the greatest ideas for new parents is to be around other new parents. Parent groups give you a chance to share and listen to others who are going through the same season of life, get a sense of what is normal infant development by watching several babies learn and grow, share your stories of triumph and struggles with empathetic ears, and forgive your own mistakes when you realize all parents are learning by trial and error. Where to find: There are many places you can meet other new parents throughout our community.  [facility] offers the following classes for new moms and their little ones:  Baby and Me (Birth to Crawling) and Breastfeeding Support Group. Go to  www.[email or web address] or call [phone] for more information. Time for your Relationship It's easy to get so caught up in meeting baby's immediate needs that it's hard to find time to connect with your partner, and meet the needs of your relationship. It's also easy to forget what "quality time with your partner" actually looks like. If you take your baby on a date, you'd be amazed how much of your couple time is spent feeding the baby, diapering the baby, admiring the baby, and talking about the baby. Dating: Try to take time for just the two of you. Babysitter tip: Sometimes when moms are breastfeeding a newborn, they find it hard to figure out how to schedule outings around baby's unpredictable feeding schedules. Have the babysitter come for a three hour period. When she comes over, if baby has just eaten, you can leave right away, and come back in two hours. If baby hasn't fed recently, you start the date at home. Once baby gets hungry and gets a good feeding in, you can head out for the rest of your date time. Date Nights at Home: If you can't get out, at least set aside one evening a week to prioritize your relationship: whenever baby dozes off or doesn't have any immediate needs, spend a little time focusing on each other. Potential conflicts: The main relationship conflicts that come up for new parents are: issues related to sexuality, financial stresses, a feeling of an unfair division of household tasks, and conflicts in parenting styles. The more you can work on these issues before baby arrives, the better!  Fun and Frills (Don't forget these. and don't feel guilty for indulging in them!) Everyone has something in life that is a fun little treat that they do just for themselves. It may be: reading the morning paper, or going for a daily jog, or having coffee with a friend once a week, or going to a movie on Friday nights,   or fine chocolates, or bubble baths, or curling up with a good  book. Unless you do fun things for yourself every now and then, it's hard to have the energy for fun with your baby. Whatever your "special" treats are, make sure you find a way to continue to indulge in them after your baby is born. These special moments can recharge you, and allow you to return to baby with a new joy   PERINATAL MOOD DISORDERS: MATERNAL MENTAL HEALTH FROM CONCEPTION THROUGH THE POSTPARTUM PERIOD   _________________________________________Emergency and Crisis Resources If you are an imminent risk to self or others, are experiencing intense personal distress, and/or have noticed significant changes in activities of daily living, call:  911 [facility]: [phone]  [address], [place], [place], [address] Mobile Crisis: 877-626-1772 National Suicide Hotline: 988 Or visit the following crisis centers: Local Emergency Departments [facility]: [address], [place]  [phone]. Hours: 8:30AM-5PM. Insurance Accepted: Medicaid, Medicare, and Uninsured.  [facility]:  [address], [place]  Mon-Friday 8am-3pm, [phone]                                                                                  ___________ Non-Crisis Resources To identify specific providers that are covered by your insurance, contact your insurance company or local agencies:  [facility]: 1-800-256-2452 [facility]: 888-581-9988 [facility]: 1-800-939-5911 Postpartum Support International- Warm-line: 1-800-944-4773                                                      __Outpatient Therapy and Medication Management   Providers:  [facility]: [phone] Hours: 9AM-5PM  Insurance Accepted: AARP, Aetna, BCBS, Cigna, [employer], Humana, Medicare  [facility] [phone] Hours: 8AM-5:30PM  nsurance Accepted: All insurances EXCEPT AARP, Aetna,  [employer], and Humana [facility]: [phone] Hours: 8AM-8PM Insurance Accepted: Aetna, BCBS, Cigna, [employer], Medicaid, Medicare, Uninsured [facility]: [phone] [facility]: [phone] Hours: 8:30AM-7PM Insurance Accepted: Aetna, BCBS, Medicaid, Medicare, Tricare, United Healthcare [facility]:  [phone]   Hours:9AM-5PM Insurance Accepted:  Aetna, BCBS, [facility], Medicaid, United Health Care  [facility]: [phone] Hours: 9AM-5:30PM Insurance Accepted: AARP, Aetna, BCBS, Cigna, and Medicaid, Medicare, United Health Care [facility]:  [phone] Hours: 9am-5pm Insurance Accepted: BCBS; they do not accept Medicaid/Medicare [facility]: [phone] Hours: 9am-9pm Insurance Accepted: All major insurance including Medicaid and Medicare [facility]: [phone] Hours: 9AM- 5PM Insurance Accepted: All insurances EXCEPT Medicaid and Medicare. [facility]: [phone]   ____________                                                                       Parenting Support Groups [facility]: [phone] [facility]:  [phone] Family Support Network: (support for children in the NICU and/or with special needs), [phone]   ___________                                                                 Mental Health Support Groups Mental Health Association: [phone]    _____________                                                                                  Online Resources Postpartum Support International: http://www.postpartum.net/  800-944-4PPD 2Moms Supporting Moms:  www.momssupportingmoms.net    

## 2022-03-22 NOTE — BH Specialist Note (Signed)
Integrated Behavioral Health via Telemedicine Visit  03/28/2022 MIEISHA STOTT [id number]  Number of Central Clinician visits: 2- Second Visit  Session Start time: 1548   Session End time: 1709  Total time in minutes: 81   Referring Provider: Clayton Lefort, MD Patient/Family location: Home [place] [facility] Provider location: Center for [place] at [facility] for Women  All persons participating in [place]   Types of Service: Individual psychotherapy and Video visit  I connected with [name] and/or Barbee [address]'s  n/a  via  Telephone or Video Enabled Telemedicine Application  (Video is Caregility application) and verified that I am speaking with the correct person using two identifiers. Discussed confidentiality: Yes   I discussed the limitations of telemedicine and the availability of in person appointments.  Discussed there is a possibility of technology failure and discussed alternative modes of communication if that failure occurs.  I discussed that engaging in this telemedicine visit, they consent to the provision of behavioral healthcare and the services will be billed under their insurance.  Patient and/or legal guardian expressed understanding and consented to Telemedicine visit: Yes   Presenting Concerns: Patient and/or family reports the following symptoms/concerns: Processing feelings regarding level of practical support to allow from others postpartum, including healthy boundaries; uncertainty regarding when to begin back on Adderall; anxious about preparing for baby's arrival. Duration of problem: Current pregnancy; Severity of problem: moderate  Patient and/or Family's Strengths/Protective Factors: Social connections, Concrete supports in place (healthy food, safe environments, etc.), Sense of purpose, and Physical Health (exercise, healthy diet, medication compliance, etc.)  Goals  Addressed: Patient will:  Reduce symptoms of: anxiety and depression   Increase knowledge and/or ability of: stress reduction   Demonstrate ability to: Increase healthy adjustment to current life circumstances  Progress towards Goals: Ongoing  Interventions: Interventions utilized:  Solution-Focused Strategies Standardized Assessments completed: Not Needed  Patient and/or Family Response: Patient agrees with treatment plan.   Assessment: Patient currently experiencing GAD, ADHD, History of MDD.   Patient may benefit from continued therapeutic interventions .  Plan: Follow up with behavioral health clinician on : Two weeks Behavioral recommendations:  -Continue taking prenatal vitamin daily -Continue using comfort measures daily as needed for remainder of pregnancy (maternity belt, attending chiropractor, attending physical therapy, using pool, walks/gym at current comfort level) -Continue plan to attend childbirth class with husband in December -[place] of hospital at [email or web address] -Pack suitcase for hospital within one week -Obtain carseat first week of December -Write down list of household tasks for family/friends to do to help postpartum when they ask; post in visible location for postpartum Referral(s): [place] (In Clinic)  I discussed the assessment and treatment plan with the patient and/or parent/guardian. They were provided an opportunity to ask questions and all were answered. They agreed with the plan and demonstrated an understanding of the instructions.   They were advised to call back or seek an in-person evaluation if the symptoms worsen or if the condition fails to improve as anticipated.  Garlan Fair, LCSW     02/22/2022   11:38 AM 02/09/2022    3:39 PM 01/25/2022   12:28 PM 12/29/2021    4:14 PM 11/30/2021   11:42 AM  Depression screen PHQ 2/9  Decreased Interest 0  1 1 1   Down, Depressed, Hopeless 0   0 0 1  PHQ - 2 Score 0  1 1 2   Altered sleeping  0  0 1 1  Tired, decreased energy 1  1 1 2   Change in appetite 0  1 0 0  Feeling bad or failure about yourself  1  1 1 1   Trouble concentrating 1  1 2 1   Moving slowly or fidgety/restless 0  0 0 0  Suicidal thoughts 0  0 0 0  PHQ-9 Score 3  5 6 7   Difficult doing work/chores   Not difficult at all Not difficult at all Not difficult at all     Information is confidential and restricted. Go to Review Flowsheets to unlock data.      02/22/2022   11:38 AM 01/25/2022   12:29 PM 12/29/2021    4:14 PM 11/30/2021   11:42 AM  GAD 7 : Generalized Anxiety Score  Nervous, Anxious, on Edge 1 2 1 1   Control/stop worrying 0 1 1 1   Worry too much - different things 0 1 0 1  Trouble relaxing 1 1 1 1   Restless 0 1 1 0  Easily annoyed or irritable 1 0 0 2  Afraid - awful might happen 0 0 1 1  Total GAD 7 Score 3 6 5 7   Anxiety Difficulty  Not difficult at all

## 2022-03-28 ENCOUNTER — Ambulatory Visit (INDEPENDENT_AMBULATORY_CARE_PROVIDER_SITE_OTHER): Payer: No Typology Code available for payment source | Admitting: Advanced Practice Midwife

## 2022-03-28 ENCOUNTER — Ambulatory Visit (INDEPENDENT_AMBULATORY_CARE_PROVIDER_SITE_OTHER): Payer: No Typology Code available for payment source | Admitting: Clinical

## 2022-03-28 ENCOUNTER — Other Ambulatory Visit (HOSPITAL_COMMUNITY)
Admission: RE | Admit: 2022-03-28 | Discharge: 2022-03-28 | Disposition: A | Discharge status: Home / Self Care | Payer: No Typology Code available for payment source | Source: Ambulatory Visit | Attending: Family Medicine | Admitting: Family Medicine

## 2022-03-28 ENCOUNTER — Encounter: Payer: No Typology Code available for payment source | Admitting: Family Medicine

## 2022-03-28 ENCOUNTER — Other Ambulatory Visit: Payer: Self-pay

## 2022-03-28 VITALS — BP 114/70 | HR 86 | Wt 234.0 lb

## 2022-03-28 DIAGNOSIS — N898 Other specified noninflammatory disorders of vagina: Secondary | ICD-10-CM | POA: Diagnosis present

## 2022-03-28 DIAGNOSIS — Z2839 Other underimmunization status: Secondary | ICD-10-CM

## 2022-03-28 DIAGNOSIS — O09893 Supervision of other high risk pregnancies, third trimester: Secondary | ICD-10-CM

## 2022-03-28 DIAGNOSIS — Z3483 Encounter for supervision of other normal pregnancy, third trimester: Secondary | ICD-10-CM

## 2022-03-28 DIAGNOSIS — F411 Generalized anxiety disorder: Secondary | ICD-10-CM | POA: Diagnosis not present

## 2022-03-28 DIAGNOSIS — Z0371 Encounter for suspected problem with amniotic cavity and membrane ruled out: Secondary | ICD-10-CM

## 2022-03-28 DIAGNOSIS — F988 Other specified behavioral and emotional disorders with onset usually occurring in childhood and adolescence: Secondary | ICD-10-CM

## 2022-03-28 DIAGNOSIS — Z3480 Encounter for supervision of other normal pregnancy, unspecified trimester: Secondary | ICD-10-CM

## 2022-03-28 DIAGNOSIS — R102 Pelvic and perineal pain: Secondary | ICD-10-CM

## 2022-03-28 DIAGNOSIS — Z8659 Personal history of other mental and behavioral disorders: Secondary | ICD-10-CM

## 2022-03-28 DIAGNOSIS — R350 Frequency of micturition: Secondary | ICD-10-CM

## 2022-03-28 DIAGNOSIS — O09899 Supervision of other high risk pregnancies, unspecified trimester: Secondary | ICD-10-CM

## 2022-03-28 DIAGNOSIS — O26893 Other specified pregnancy related conditions, third trimester: Secondary | ICD-10-CM

## 2022-03-28 DIAGNOSIS — Z3A33 33 weeks gestation of pregnancy: Secondary | ICD-10-CM

## 2022-03-28 LAB — POCT URINALYSIS DIP (DEVICE)
Bilirubin Urine: NEGATIVE
Glucose, UA: 250 mg/dL — AB
Hgb urine dipstick: NEGATIVE
Ketones, ur: NEGATIVE mg/dL
Leukocytes,Ua: NEGATIVE
Nitrite: NEGATIVE
Protein, ur: NEGATIVE mg/dL
Specific Gravity, Urine: 1.02 (ref 1.005–1.030)
Urobilinogen, UA: 0.2 mg/dL (ref 0.0–1.0)
pH: 7 (ref 5.0–8.0)

## 2022-03-28 NOTE — Progress Notes (Unsigned)
   PRENATAL VISIT NOTE  Subjective:  Cindy Jones is a 31 y.o. G2P0010 at [redacted]w[redacted]d being seen today for ongoing prenatal care.  She is currently monitored for the following issues for this {Blank single:19197::"high-risk","low-risk"} pregnancy and has Tourette disease; ADD (attention deficit disorder); Bulimia; Overweight (BMI 25.0-29.9); GAD (generalized anxiety disorder); Exercise-induced asthma; Urticaria; Other adverse food reactions, not elsewhere classified, subsequent encounter; Other allergic rhinitis; History of asthma; Supervision of other normal pregnancy, antepartum; Major depressive disorder, single episode, in full remission (HCC); and Rubella non-immune status, antepartum on their problem list.  Patient reports {sx:[id number]}.  Contractions: Not present. Vag. Bleeding: None.  Movement: Present. Denies leaking of fluid.   The following portions of the patient's history were reviewed and updated as appropriate: allergies, current medications, past family history, past medical history, past social history, past surgical history and problem list.   Objective:   Vitals:   03/28/22 0912  BP: 114/70  Pulse: 86  Weight: 234 lb (106.1 kg)    Fetal Status: Fetal Heart Rate (bpm): 130   Movement: Present     General:  Alert, oriented and cooperative. Patient is in no acute distress.  Skin: Skin is warm and dry. No rash noted.   Cardiovascular: Normal heart rate noted  Respiratory: Normal respiratory effort, no problems with respiration noted  Abdomen: Soft, gravid, appropriate for gestational age.  Pain/Pressure: Present     Pelvic: {Blank single:19197::"Cervical exam performed in the presence of a chaperone","Cervical exam deferred"}        Extremities: Normal range of motion.  Edema: Trace  Mental Status: Normal mood and affect. Normal behavior. Normal judgment and thought content.   Assessment and Plan:  Pregnancy: G2P0010 at [redacted]w[redacted]d 1. [redacted] weeks gestation of pregnancy ***  2.  Supervision of other normal pregnancy, antepartum ***  3. Rubella non-immune status, antepartum ***  {Blank single:19197::"Term","Preterm"} labor symptoms and general obstetric precautions including but not limited to vaginal bleeding, contractions, leaking of fluid and fetal movement were reviewed in detail with the patient. Please refer to After Visit Summary for other counseling recommendations.   No follow-ups on file.  Future Appointments  Date Time Provider Department Center  03/28/2022  3:45 PM Peter Congo [facility]  04/14/2022  8:55 AM Venora Maples, MD [facility]  04/14/2022 10:15 AM [facility] NURSE [facility]  04/14/2022 10:30 AM [facility] US2 [facility]  04/26/2022 11:15 AM Bernerd Limbo, CNM [facility]  05/04/2022  9:55 AM Venora Maples, MD [facility]  05/11/2022  9:55 AM Venora Maples, MD [facility]  05/18/2022  9:15 AM [facility] NST [facility]  05/19/2022 10:15 AM Venora Maples, MD [facility]  05/25/2022 11:30 AM Oletta Darter, MD [facility] None  05/30/2022  8:20 AM Thomasene Ripple, DO [facility] None    Dorathy Kinsman, CNM

## 2022-03-28 NOTE — Patient Instructions (Addendum)
Center for [facility] at [facility] for [facility] [address] [place], [place] [address] [phone] (main office) [phone] (Latesha Chesney's office)  Virtual hospital tour:  [email or web address]  MotherToBaby: [email or web address]

## 2022-03-29 LAB — CERVICOVAGINAL ANCILLARY ONLY
Bacterial Vaginitis (gardnerella): NEGATIVE
Candida Glabrata: NEGATIVE
Candida Vaginitis: NEGATIVE
Comment: NEGATIVE
Comment: NEGATIVE
Comment: NEGATIVE
Comment: NEGATIVE
Trichomonas: NEGATIVE

## 2022-03-31 ENCOUNTER — Ambulatory Visit
Payer: No Typology Code available for payment source | Attending: Advanced Practice Midwife | Admitting: Physical Therapy

## 2022-03-31 ENCOUNTER — Other Ambulatory Visit: Payer: Self-pay

## 2022-03-31 DIAGNOSIS — M62838 Other muscle spasm: Secondary | ICD-10-CM | POA: Diagnosis not present

## 2022-03-31 DIAGNOSIS — M6208 Separation of muscle (nontraumatic), other site: Secondary | ICD-10-CM | POA: Insufficient documentation

## 2022-03-31 DIAGNOSIS — O26893 Other specified pregnancy related conditions, third trimester: Secondary | ICD-10-CM | POA: Insufficient documentation

## 2022-03-31 DIAGNOSIS — O99891 Other specified diseases and conditions complicating pregnancy: Secondary | ICD-10-CM | POA: Insufficient documentation

## 2022-03-31 DIAGNOSIS — R279 Unspecified lack of coordination: Secondary | ICD-10-CM

## 2022-03-31 DIAGNOSIS — R293 Abnormal posture: Secondary | ICD-10-CM | POA: Diagnosis not present

## 2022-03-31 DIAGNOSIS — M6281 Muscle weakness (generalized): Secondary | ICD-10-CM | POA: Diagnosis not present

## 2022-03-31 DIAGNOSIS — Z3A33 33 weeks gestation of pregnancy: Secondary | ICD-10-CM | POA: Insufficient documentation

## 2022-03-31 DIAGNOSIS — R102 Pelvic and perineal pain: Secondary | ICD-10-CM | POA: Insufficient documentation

## 2022-03-31 NOTE — Therapy (Signed)
OUTPATIENT PHYSICAL THERAPY FEMALE PELVIC EVALUATION   Patient Name: Cindy Jones MRN: [id number] DOB:[birth year], 31 y.o., female Today's Date: 03/31/2022  END OF SESSION:  PT End of Session - 03/31/22 0852     Visit Number 1    Date for PT Re-Evaluation 05/16/22    Authorization Type [facility] employee focus    PT Start Time [phone]   pt arrival time   PT Stop Time 0930    PT Time Calculation (min) 38 min    Activity Tolerance Patient tolerated treatment well    Behavior During Therapy [facility] for tasks assessed/performed             Past Medical History:  Diagnosis Date   Anxiety    Asthma    as a child   Binge eating disorder    Dysrhythmia    " I feel it about once a month"   Frequent headaches    GAD (generalized anxiety disorder)    GERD (gastroesophageal reflux disease)    History of acute PID 06/09/2019   Major depression in complete remission ([place])    Migraines    PID (acute pelvic inflammatory disease)    Seasonal allergies    Tourette disorder    Urinary tract bacterial infections    Past Surgical History:  Procedure Laterality Date   CHOLECYSTECTOMY     ESOPHAGOGASTRODUODENOSCOPY     LAPAROSCOPIC CHOLECYSTECTOMY SINGLE SITE WITH INTRAOPERATIVE CHOLANGIOGRAM N/A 03/15/2016   Procedure: LAPAROSCOPIC CHOLECYSTECTOMY SINGLE SITE WITH INTRAOPERATIVE CHOLANGIOGRAM;  Surgeon: Michael Boston, MD;  Location: [place];  Service: General;  Laterality: N/A;   TONSILLECTOMY AND ADENOIDECTOMY  2013   WISDOM TOOTH EXTRACTION     Patient Active Problem List   Diagnosis Date Noted   Rubella non-immune status, antepartum 12/07/2021   Major depressive disorder, single episode, in full remission ([place]) 11/02/2021   Supervision of other normal pregnancy, antepartum 10/11/2021   History of asthma 09/23/2020   Urticaria 08/24/2020   Other adverse food reactions, not elsewhere classified, subsequent encounter 08/24/2020   Other allergic rhinitis 08/24/2020   Exercise-induced asthma  10/27/2019   GAD (generalized anxiety disorder) 11/07/2018   Overweight (BMI 25.0-29.9) 06/03/2018   Bulimia 04/20/2015   ADD (attention deficit disorder) 01/15/2014   Tourette disease 10/18/2013    PCP: Donnamae Jude, MD  REFERRING PROVIDER: Manya Silvas, CNM  REFERRING DIAG: Z3A.33 (ICD-10-CM) - [redacted] weeks gestation of pregnancy O26.893,R10.2 (ICD-10-CM) - Pelvic pressure in pregnancy, antepartum, third trimester  THERAPY DIAG:  Muscle weakness (generalized)  Other muscle spasm  Abnormal posture  Unspecified lack of coordination  Rationale for Evaluation and Treatment: Rehabilitation  ONSET DATE: [redacted] weeks pregnant  SUBJECTIVE:  SUBJECTIVE STATEMENT: Pelvic pain with pregnancy, 34 weeks. Pain varies based on time of day, worse with lifting one leg, side lying, single leg standing, getting in and out of cars.    PAIN:  Are you having pain? Yes NPRS scale: 8/10 worst, best with low movement 1-2/10 Pain location:  bil groins wrapping between legs  Pain type: sharp with mobility, dull with no mobility Pain description: constant   Aggravating factors: single leg tasks, side lying, car transfers, dressing, walking especially on hills Relieving factors: rest  PRECAUTIONS: Other: pregnancy   WEIGHT BEARING RESTRICTIONS: No  FALLS:  Has patient fallen in last 6 months? No  LIVING ENVIRONMENT: Lives with: lives with their family Lives in: House/apartment   OCCUPATION: data analyst    PLOF: Independent  PATIENT GOALS: to have less pain  PERTINENT HISTORY:  low-risk pregnancy and has Tourette disease; ADD (attention deficit disorder); Bulimia; Overweight (BMI 25.0-29.9); GAD (generalized anxiety disorder); Exercise-induced asthma; Urticaria; Sexual abuse: No  BOWEL  MOVEMENT: Pain with bowel movement: No Type of bowel movement:Type (Bristol Stool Scale) 4, Frequency daily, and Strain No Fully empty rectum: Yes:   Leakage: No Pads: No Fiber supplement: Yes: miralax and magnesium   URINATION: Pain with urination: No Fully empty bladder: Yes:   Stream: Strong Urgency: Yes:   Frequency: increased with pregnancy, sometimes 2-3x times Leakage:  full bladder with sneezing may have small leakage Pads: No  INTERCOURSE: Pain with intercourse: Initial Penetration and During Penetration Ability to have vaginal penetration:  Yes:   Climax: not painful Marinoff Scale: 1/3  PREGNANCY: Vaginal deliveries 0 Tearing No C-section deliveries 0 Currently pregnant Yes: 33 weeks  PROLAPSE: None   OBJECTIVE:   DIAGNOSTIC FINDINGS:   COGNITION: Overall cognitive status: Within functional limits for tasks assessed     SENSATION: Light touch: Appears intact Proprioception: Appears intact  MUSCLE LENGTH: Bil hamstrings and adductors limited by 25%  SINGLE LEG STANCE: (+) bil for pain and hip drop   POSTURE: rounded shoulders, forward head, and anterior pelvic tilt  PELVIC ALIGNMENT: anterior tilt with limited mobility for posterior tilting   LUMBARAROM/PROM:  A/PROM A/PROM  eval  Flexion Limited by 25%  Extension Limited by 25%  Right lateral flexion Limited by 50%  Left lateral flexion Limited by 50%  Right rotation Limited by 25%  Left rotation Limited by 25%   (Blank rows = not tested)  LOWER EXTREMITY ROM:  WFL  LOWER EXTREMITY MMT:  Bil hip abduction and adduction 3+/5 however limited with pelvic pain, flexion 4/5, ext 4/5 but back pain; knees and ankles 5/5  PALPATION:   General  mild TTP at bil groin and lower abdominal quadrants with fascial restrictions                 External Perineal Exam deferred                              Internal Pelvic Floor deferred   Patient confirms identification and approves PT to assess  internal pelvic floor and treatment No  PELVIC MMT:   MMT eval  Vaginal   Internal Anal Sphincter   External Anal Sphincter   Puborectalis   Diastasis Recti   (Blank rows = not tested)        TONE: Deferred   PROLAPSE: deferred  TODAY'S TREATMENT:  DATE: 03/31/22   EVAL Examination completed, findings reviewed, pt educated on POC, HEP, and manual for k-tape at abdomen. Pt denied adhesive allergy and does have latex allergy but tape is latex free. X5 pieces used for abdominal support and decreased pain at pelvis. Pt also educated on log rolling technique for bed mobility to decreased strain at pelvic and pain. Pt motivated to participate in PT and agreeable to attempt recommendations.     PATIENT EDUCATION:  Education details: BXHWMFWM Person educated: Patient Education method: Explanation, Demonstration, Tactile cues, Verbal cues, and Handouts Education comprehension: verbalized understanding and returned demonstration  HOME EXERCISE PROGRAM: BXHWMFWM  ASSESSMENT:  CLINICAL IMPRESSION: Patient is a 31 y.o. female  who was seen today for physical therapy evaluation and treatment for pelvic and back pain during pregnancy. Pt is currently [redacted] weeks pregnant  with first baby and has had increased pelvic pain with all mobility worse with walking especially hills, single leg tasks (dressing, bathing), car transfers and Sit to stands. Pt also has tailbone pain with sitting. Pt demonstrates impaired posture, decreased TA activation with all mobility, (+) single leg stance test bil with hip drop and increased pain. Pt also reports back pain with tightness felt with any twisting or ant/post mobility. Pt found to have decreased flexibility in spine in all directions, decreased core and hip strength and limitations with pain at pelvis and low back. Pt has decreased  mobility at lumbar and thoracic spine. Pt educated on HEP focusing on hip and low back mobility during pregnancy and manual work completed today for taping abdomen for improved support and decreased pain. Pt reports she has decreased pain with taping and noted improvement in support felt, has belly band but reports this doesn't help.   OBJECTIVE IMPAIRMENTS: decreased coordination, decreased endurance, decreased mobility, decreased strength, increased fascial restrictions, increased muscle spasms, impaired flexibility, improper body mechanics, postural dysfunction, and pain.   ACTIVITY LIMITATIONS: carrying, lifting, bending, sitting, standing, squatting, sleeping, stairs, transfers, bed mobility, dressing, and locomotion level  PARTICIPATION LIMITATIONS: interpersonal relationship, driving, shopping, community activity, and occupation  PERSONAL FACTORS: Fitness, Time since onset of injury/illness/exacerbation, and 1 comorbidity: pregnant  are also affecting patient's functional outcome.   REHAB POTENTIAL: Good  CLINICAL DECISION MAKING: Stable/uncomplicated  EVALUATION COMPLEXITY: Low   GOALS: Goals reviewed with patient? Yes  SHORT TERM GOALS: Target date: 04/29/23  Pt to be I with HEP.  Baseline: Goal status: INITIAL  2.  Pt will report no more than 6/10 pelvic pain due to improvements in posture, strength, and muscle length  Baseline:  Goal status: INITIAL  3.  Pt to demonstrate I  body mechanics with bed mobility, transfers, standing and sitting posture to decrease pain at back and pelvis during pregnancy without cues.  Baseline:  Goal status: INITIAL  LONG TERM GOALS: Target date: 05/16/22  Pt to be I with advanced HEP.  Baseline:  Goal status: INITIAL  2.  Pt will report no more than 4/10 pelvic pain due to improvements in posture, strength, and muscle length  Baseline:  Goal status: INITIAL  3.  Pt to be I with coordination of breathing mechanics and core activation  for improved pelvic stability with standing and single leg tasks to decrease pain at back and pelvis.  Baseline:  Goal status: INITIAL  4.  Pt to demonstrate at least 5/5 bil hip strength for improved pelvic stability and functional squats without leakage.  Baseline:  Goal status: INITIAL   PLAN:  PT FREQUENCY: 1-2x/week  PT DURATION: other: 5 weeks  PLANNED INTERVENTIONS: Therapeutic exercises, Therapeutic activity, Neuromuscular re-education, Patient/Family education, Self Care, Joint mobilization, DME instructions, Aquatic Therapy, Dry Needling, Spinal mobilization, Cryotherapy, Moist heat, scar mobilization, Taping, Biofeedback, and Manual therapy  PLAN FOR NEXT SESSION: taping for abdominal support during pregnancy, TA activations with transfers and rolling for decreased strain/coning at abdomen, diaphragmatic breathing training for rib mobility and core activations, glute and opp lat activations for strengthening posterior sling, spine and hip stretching, hip IR mobility and strengthening.   Otelia Sergeant, PT, DPT 12/01/239:58 AM

## 2022-04-03 NOTE — BH Specialist Note (Signed)
Integrated Behavioral Health via Telemedicine Visit  04/12/2022 FARYN SIEG [id number]  Number of Integrated Behavioral Health Clinician visits: 3- Third Visit  Session Start time: 1317   Session End time: 1421  Total time in minutes: 64   Referring Provider: Merian Capron, MD Patient/Family location: Home [facility] Provider location: Center for [facility] at [facility]  All persons participating in visit: Patient Cindy Jones and [facility] Alexyia Guarino    Types of Service: Individual psychotherapy and Video visit  I connected with [name] and/or Bernette Redbird Marcano's  n/a  via  Telephone or Video Enabled Telemedicine Application  (Video is Caregility application) and verified that I am speaking with the correct person using two identifiers. Discussed confidentiality: Yes   I discussed the limitations of telemedicine and the availability of in person appointments.  Discussed there is a possibility of technology failure and discussed alternative modes of communication if that failure occurs.  I discussed that engaging in this telemedicine visit, they consent to the provision of behavioral healthcare and the services will be billed under their insurance.  Patient and/or legal guardian expressed understanding and consented to Telemedicine visit: Yes   Presenting Concerns: Patient and/or family reports the following symptoms/concerns: Fear of having a migraine or vomiting in labor; ongoing physical discomfort in third trimester and working on final preparations for baby's arrival.  Duration of problem: Current pregnancy; Severity of problem: moderate  Patient and/or Family's Strengths/Protective Factors: Social connections, Concrete supports in place (healthy food, safe environments, etc.), and Sense of purpose  Goals Addressed: Patient will:  Reduce symptoms of: anxiety and depression   Increase knowledge and/or ability of: stress reduction    Demonstrate ability to: Increase healthy adjustment to current life circumstances  Progress towards Goals: Ongoing  Interventions: Interventions utilized:  Solution-Focused Strategies Standardized Assessments completed: Not Needed  Patient and/or Family Response: Patient agrees with treatment plan.   Assessment: Patient currently experiencing GAD, ADHD; History of MDD.   Patient may benefit from continued therapeutic interventions.  Plan: Follow up with behavioral health clinician on : Three weeks Behavioral recommendations:  -Continue taking prenatal vitamin daily -Continue with ongoing comfort measures (chiropractor, physical therapy, pool, salt pool, daily 1/2 mile walks) -Continue plan to discuss expectations in labor with labor support persons (husband; sister) -Enjoy holiday season with supportive friends and family Referral(s): Integrated [employer] (In Clinic)  I discussed the assessment and treatment plan with the patient and/or parent/guardian. They were provided an opportunity to ask questions and all were answered. They agreed with the plan and demonstrated an understanding of the instructions.   They were advised to call back or seek an in-person evaluation if the symptoms worsen or if the condition fails to improve as anticipated.  Rae Lips, LCSW     02/22/2022   11:38 AM 02/09/2022    3:39 PM 01/25/2022   12:28 PM 12/29/2021    4:14 PM 11/30/2021   11:42 AM  Depression screen PHQ 2/9  Decreased Interest 0  1 1 1   Down, Depressed, Hopeless 0  0 0 1  PHQ - 2 Score 0  1 1 2   Altered sleeping 0  0 1 1  Tired, decreased energy 1  1 1 2   Change in appetite 0  1 0 0  Feeling bad or failure about yourself  1  1 1 1   Trouble concentrating 1  1 2 1   Moving slowly or fidgety/restless 0  0 0 0  Suicidal thoughts 0  0 0 0  PHQ-9 Score 3  5 6 7   Difficult doing work/chores   Not difficult at all Not difficult at all Not difficult at all      Information is confidential and restricted. Go to Review Flowsheets to unlock data.      02/22/2022   11:38 AM 01/25/2022   12:29 PM 12/29/2021    4:14 PM 11/30/2021   11:42 AM  GAD 7 : Generalized Anxiety Score  Nervous, Anxious, on Edge 1 2 1 1   Control/stop worrying 0 1 1 1   Worry too much - different things 0 1 0 1  Trouble relaxing 1 1 1 1   Restless 0 1 1 0  Easily annoyed or irritable 1 0 0 2  Afraid - awful might happen 0 0 1 1  Total GAD 7 Score 3 6 5 7   Anxiety Difficulty  Not difficult at all

## 2022-04-04 ENCOUNTER — Other Ambulatory Visit (HOSPITAL_BASED_OUTPATIENT_CLINIC_OR_DEPARTMENT_OTHER): Payer: Self-pay

## 2022-04-04 ENCOUNTER — Encounter: Payer: Self-pay | Admitting: Family Medicine

## 2022-04-04 ENCOUNTER — Ambulatory Visit: Payer: No Typology Code available for payment source | Admitting: Physical Therapy

## 2022-04-04 ENCOUNTER — Encounter: Payer: Self-pay | Admitting: Physical Therapy

## 2022-04-04 DIAGNOSIS — M62838 Other muscle spasm: Secondary | ICD-10-CM

## 2022-04-04 DIAGNOSIS — O26893 Other specified pregnancy related conditions, third trimester: Secondary | ICD-10-CM | POA: Diagnosis not present

## 2022-04-04 DIAGNOSIS — R293 Abnormal posture: Secondary | ICD-10-CM

## 2022-04-04 DIAGNOSIS — R279 Unspecified lack of coordination: Secondary | ICD-10-CM

## 2022-04-04 DIAGNOSIS — M6281 Muscle weakness (generalized): Secondary | ICD-10-CM

## 2022-04-04 MED ORDER — ABRYSVO 120 MCG/0.5ML IM SOLR
0.5000 mL | Freq: Once | INTRAMUSCULAR | 0 refills | Status: AC
  Filled 2022-04-04: qty 0.5, 1d supply, fill #0

## 2022-04-04 NOTE — Therapy (Addendum)
OUTPATIENT PHYSICAL THERAPY TREATMENT NOTE   Patient Name: Cindy Jones MRN: [id number] DOB:[birth year], 31 y.o., female Today's Date: 04/04/2022  PCP: Reva Bores, MD REFERRING PROVIDER: Dorathy Kinsman, CNM  END OF SESSION:   PT End of Session - 04/04/22 0805     Visit Number 2    Date for PT Re-Evaluation 05/16/22    Authorization Type [employer]    PT Start Time 0805    PT Stop Time 0845    PT Time Calculation (min) 40 min    Activity Tolerance Patient tolerated treatment well    Behavior During Therapy WFL for tasks assessed/performed             Past Medical History:  Diagnosis Date   Anxiety    Asthma    as a child   Binge eating disorder    Dysrhythmia    " I feel it about once a month"   Frequent headaches    GAD (generalized anxiety disorder)    GERD (gastroesophageal reflux disease)    History of acute PID 06/09/2019   Major depression in complete remission (HCC)    Migraines    PID (acute pelvic inflammatory disease)    Seasonal allergies    Tourette disorder    Urinary tract bacterial infections    Past Surgical History:  Procedure Laterality Date   CHOLECYSTECTOMY     ESOPHAGOGASTRODUODENOSCOPY     LAPAROSCOPIC CHOLECYSTECTOMY SINGLE SITE WITH INTRAOPERATIVE CHOLANGIOGRAM N/A 03/15/2016   Procedure: LAPAROSCOPIC CHOLECYSTECTOMY SINGLE SITE WITH INTRAOPERATIVE CHOLANGIOGRAM;  Surgeon: Karie Soda, MD;  Location: [facility] OR;  Service: General;  Laterality: N/A;   TONSILLECTOMY AND ADENOIDECTOMY  2013   WISDOM TOOTH EXTRACTION     Patient Active Problem List   Diagnosis Date Noted   Rubella non-immune status, antepartum 12/07/2021   Major depressive disorder, single episode, in full remission (HCC) 11/02/2021   Supervision of other normal pregnancy, antepartum 10/11/2021   History of asthma 09/23/2020   Urticaria 08/24/2020   Other adverse food reactions, not elsewhere classified, subsequent encounter 08/24/2020   Other allergic  rhinitis 08/24/2020   Exercise-induced asthma 10/27/2019   GAD (generalized anxiety disorder) 11/07/2018   Overweight (BMI 25.0-29.9) 06/03/2018   Bulimia 04/20/2015   ADD (attention deficit disorder) 01/15/2014   Tourette disease 10/18/2013    REFERRING DIAG: Z3A.33 (ICD-10-CM) - [redacted] weeks gestation of pregnancy O26.893,R10.2 (ICD-10-CM) - Pelvic pressure in pregnancy, antepartum, third trimester  THERAPY DIAG:  Muscle weakness (generalized)  Other muscle spasm  Abnormal posture  Unspecified lack of coordination  Rationale for Evaluation and Treatment Rehabilitation  ONSET DATE: [redacted] weeks pregnant  PERTINENT HISTORY: PERTINENT HISTORY:  low-risk pregnancy and has Tourette disease; ADD (attention deficit disorder); Bulimia; Overweight (BMI 25.0-29.9); GAD (generalized anxiety disorder); Exercise-induced asthma; Urticaria; Sexual abuse: No     BOWEL MOVEMENT: Pain with bowel movement: No Type of bowel movement:Type (Bristol Stool Scale) 4, Frequency daily, and Strain No Fully empty rectum: Yes:   Leakage: No Pads: No Fiber supplement: Yes: miralax and magnesium    URINATION: Pain with urination: No Fully empty bladder: Yes:   Stream: Strong Urgency: Yes:   Frequency: increased with pregnancy, sometimes 2-3x times Leakage:  full bladder with sneezing may have small leakage Pads: No   INTERCOURSE: Pain with intercourse: Initial Penetration and During Penetration Ability to have vaginal penetration:  Yes:   Climax: not painful Marinoff Scale: 1/3   PREGNANCY: Vaginal deliveries 0 Tearing No C-section deliveries 0 Currently pregnant Yes:  33 weeks   PROLAPSE: None  PRECAUTIONS: PRECAUTIONS: Other: pregnant  SUBJECTIVE:                                                                                                                                                                                      SUBJECTIVE STATEMENT:  I felt great until I took the tape off  this morning.     PAIN:  Are you having pain? Yes NPRS scale: 4-5/10 if walking a lot, tape helped lower pain level Pain location:  bil groins wrapping between legs   Pain type: sharp with mobility, dull with no mobility Pain description: constant    Aggravating factors: single leg tasks, side lying, car transfers, dressing, walking especially on hills Relieving factors: rest   OBJECTIVE: (objective measures completed at initial evaluation unless otherwise dated)   DIAGNOSTIC FINDINGS:    COGNITION: Overall cognitive status: Within functional limits for tasks assessed                          SENSATION: Light touch: Appears intact Proprioception: Appears intact   MUSCLE LENGTH: Bil hamstrings and adductors limited by 25%   SINGLE LEG STANCE: (+) bil for pain and hip drop    POSTURE: rounded shoulders, forward head, and anterior pelvic tilt   PELVIC ALIGNMENT: anterior tilt with limited mobility for posterior tilting    LUMBARAROM/PROM:   A/PROM A/PROM  eval  Flexion Limited by 25%  Extension Limited by 25%  Right lateral flexion Limited by 50%  Left lateral flexion Limited by 50%  Right rotation Limited by 25%  Left rotation Limited by 25%   (Blank rows = not tested)   LOWER EXTREMITY ROM:   WFL   LOWER EXTREMITY MMT:   Bil hip abduction and adduction 3+/5 however limited with pelvic pain, flexion 4/5, ext 4/5 but back pain; knees and ankles 5/5   PALPATION:   General  mild TTP at bil groin and lower abdominal quadrants with fascial restrictions                  External Perineal Exam deferred                              Internal Pelvic Floor deferred    Patient confirms identification and approves PT to assess internal pelvic floor and treatment No   PELVIC MMT:   MMT eval  Vaginal    Internal Anal Sphincter    External Anal Sphincter    Puborectalis    Diastasis Recti    (Blank rows =  not tested)         TONE: Deferred     PROLAPSE: deferred   TODAY'S TREATMENT:                                                                                                                              DATE:   04/04/22: Abdominal tape application with U anchor and 4 long strips anteriorly and laterally, husband present to observe (Pt bought KT tape).  Discussed need for ongoing skin observation to avoid irritation and allow skin to breathe. Quadruped rocking neutral hips, hips in IR, hips in ER x 5 each Quadruped mad cat with child's pose, add SB 5x5" each way Seated diaghragmatic inhale (VC and TC for wide ribcage expansion on inhale), on exhale LE ball squeeze with TA engagement - x 10 rounds Sit to stand with TA and ball squeeze x10 from low mat table Seated bil UE 3lb raise to shoulder height with exhale and TA indraw SL open book (attempted leg on peanut bolster but pain so d/c'd LE positioning attempt) bil x 10 Log rolling practice with TA - able to perform without pain  03/31/22   EVAL Examination completed, findings reviewed, pt educated on POC, HEP, and manual for k-tape at abdomen. Pt denied adhesive allergy and does have latex allergy but tape is latex free. X5 pieces used for abdominal support and decreased pain at pelvis. Pt also educated on log rolling technique for bed mobility to decreased strain at pelvic and pain. Pt motivated to participate in PT and agreeable to attempt recommendations.       PATIENT EDUCATION:  Education details: [email or web address] Person educated: Patient Education method: Explanation, Demonstration, Tactile cues, Verbal cues, and Handouts Education comprehension: verbalized understanding and returned demonstration   HOME EXERCISE PROGRAM: 04/04/22:  Access Code: [email or web address] URL: [email or web address] Date: 04/04/2022 Prepared by: Loistine Simas Ridgely Anastacio  Exercises - Quadruped Rocking Slow  - 1 x daily - 7 x weekly - 1 sets - 5 reps - 5 hold - Cat to Child's Pose with Posterior Pelvic  Tilt  - 1 x daily - 7 x weekly - 1 sets - 5 reps - 5 hold - Sit to Stand with Ball Between Knees  - 1 x daily - 7 x weekly - 1 sets - 1 reps - 10 hold - Seated Shoulder Flexion with Dumbbells  - 1 x daily - 7 x weekly - 1 sets - 1 reps - 10 hold  Evaluation: Access Code: [email or web address] URL: [email or web address] Date: 04/04/2022 Prepared by: Morton Peters  Program Notes Addition: Hip shifting x10 - sitting with feet flat on ground, shift one hip forward. Bringing one knee pushed forward then return it back and then do the other knee forward.   Exercises - Seated Diaphragmatic Breathing  - 1 x daily - 7 x weekly - 1 sets - 10 reps - Quadruped Abdominal and Pelvic Brace  - 1 x daily - 7 x weekly -  1 sets - 10 reps - Tail Wag  - 1 x daily - 7 x weekly - 1 sets - 10 reps - 3s holds - Quadruped Cat Cow  - 1 x daily - 7 x weekly - 1 sets - 10 reps - 3s holds - Unilateral Supported Supine Butterfly Stretch  - 1 x daily - 7 x weekly - 1 sets - 2 reps - 30s holds - Supine Hip Internal and External Rotation  - 1 x daily - 7 x weekly - 1 sets - 2 reps - 30s holds - Sidelying Open Book Thoracic Rotation with Knee on Foam Roll  - 1 x daily - 7 x weekly - 1 sets - 2 reps - 30s holds - Seated Alternating Side Stretch with Arm Overhead  - 1 x daily - 7 x weekly - 1 sets - 2 reps - 30s holds - Pelvic Tilt on Swiss Ball  - 1 x daily - 7 x weekly - 1 sets - 10 reps   ASSESSMENT:   CLINICAL IMPRESSION: Patient reported signif relief with KT tape which she had removed this AM.  She felt immediate return of pain without the tape.  No skin irritation present so PT applied KT tape with husband observing so he can try to replicate as Pt bought a roll of tape.  She may benefit from trial of various external bracing options in case of skin irritation over time with repeated taping.  PT initiated spinal, hip and pelvic ROM and stretching with good tolerance (gave HEP).  Pt initiated education on  diaphragmatic breathing, exhale with TA engagement with sit to stand and UE functional lifting, and log rolling with TA.  She will continue to benefit from stabilization, mobility and strength as she progresses through late stage pregnancy.   OBJECTIVE IMPAIRMENTS: decreased coordination, decreased endurance, decreased mobility, decreased strength, increased fascial restrictions, increased muscle spasms, impaired flexibility, improper body mechanics, postural dysfunction, and pain.    ACTIVITY LIMITATIONS: carrying, lifting, bending, sitting, standing, squatting, sleeping, stairs, transfers, bed mobility, dressing, and locomotion level   PARTICIPATION LIMITATIONS: interpersonal relationship, driving, shopping, community activity, and occupation   PERSONAL FACTORS: Fitness, Time since onset of injury/illness/exacerbation, and 1 comorbidity: pregnant  are also affecting patient's functional outcome.    REHAB POTENTIAL: Good   CLINICAL DECISION MAKING: Stable/uncomplicated   EVALUATION COMPLEXITY: Low     GOALS: Goals reviewed with patient? Yes   SHORT TERM GOALS: Target date: 04/29/23   Pt to be I with HEP.  Baseline: Goal status: ongoing   2.  Pt will report no more than 6/10 pelvic pain due to improvements in posture, strength, and muscle length  Baseline:  Goal status: INITIAL   3.  Pt to demonstrate I  body mechanics with bed mobility, transfers, standing and sitting posture to decrease pain at back and pelvis during pregnancy without cues.  Baseline:  Goal status: ongoing   LONG TERM GOALS: Target date: 05/16/22   Pt to be I with advanced HEP.  Baseline:  Goal status: INITIAL   2.  Pt will report no more than 4/10 pelvic pain due to improvements in posture, strength, and muscle length  Baseline:  Goal status: INITIAL   3.  Pt to be I with coordination of breathing mechanics and core activation for improved pelvic stability with standing and single leg tasks to decrease  pain at back and pelvis.  Baseline:  Goal status: INITIAL   4.  Pt to demonstrate at least  5/5 bil hip strength for improved pelvic stability and functional squats without leakage.  Baseline:  Goal status: INITIAL     PLAN:   PT FREQUENCY: 1-2x/week   PT DURATION: other: 5 weeks   PLANNED INTERVENTIONS: Therapeutic exercises, Therapeutic activity, Neuromuscular re-education, Patient/Family education, Self Care, Joint mobilization, DME instructions, Aquatic Therapy, Dry Needling, Spinal mobilization, Cryotherapy, Moist heat, scar mobilization, Taping, Biofeedback, and Manual therapy   PLAN FOR NEXT SESSION: taping for abdominal support during pregnancy, TA activations with transfers and rolling for decreased strain/coning at abdomen, diaphragmatic breathing training for rib mobility and core activations, glute and opp lat activations for strengthening posterior sling, spine and hip stretching, hip IR mobility and strengthening.    Armie Moren, PT 04/04/22 10:22 AM

## 2022-04-05 ENCOUNTER — Encounter (HOSPITAL_BASED_OUTPATIENT_CLINIC_OR_DEPARTMENT_OTHER): Payer: Self-pay | Admitting: Pharmacist

## 2022-04-05 ENCOUNTER — Other Ambulatory Visit (HOSPITAL_BASED_OUTPATIENT_CLINIC_OR_DEPARTMENT_OTHER): Payer: Self-pay

## 2022-04-06 ENCOUNTER — Ambulatory Visit: Payer: No Typology Code available for payment source | Admitting: Physical Therapy

## 2022-04-06 ENCOUNTER — Other Ambulatory Visit (HOSPITAL_BASED_OUTPATIENT_CLINIC_OR_DEPARTMENT_OTHER): Payer: Self-pay

## 2022-04-11 ENCOUNTER — Encounter: Payer: Self-pay | Admitting: Physical Therapy

## 2022-04-11 ENCOUNTER — Ambulatory Visit: Payer: No Typology Code available for payment source | Admitting: Physical Therapy

## 2022-04-11 DIAGNOSIS — O26893 Other specified pregnancy related conditions, third trimester: Secondary | ICD-10-CM | POA: Diagnosis not present

## 2022-04-11 DIAGNOSIS — R293 Abnormal posture: Secondary | ICD-10-CM

## 2022-04-11 DIAGNOSIS — M6281 Muscle weakness (generalized): Secondary | ICD-10-CM

## 2022-04-11 DIAGNOSIS — M62838 Other muscle spasm: Secondary | ICD-10-CM

## 2022-04-11 NOTE — Therapy (Signed)
OUTPATIENT PHYSICAL THERAPY TREATMENT NOTE   Patient Name: Cindy Jones MRN: [id number] DOB:[birth year], 31 y.o., female Today's Date: 04/11/2022  PCP: Reva Bores, MD REFERRING PROVIDER: Dorathy Kinsman, CNM  END OF SESSION:   PT End of Session - 04/11/22 0804     Visit Number 3    Date for PT Re-Evaluation 05/16/22    Authorization Type [employer]    PT Start Time 0803    PT Stop Time 0845    PT Time Calculation (min) 42 min    Activity Tolerance Patient tolerated treatment well    Behavior During Therapy [facility] for tasks assessed/performed              Past Medical History:  Diagnosis Date   Anxiety    Asthma    as a child   Binge eating disorder    Dysrhythmia    " I feel it about once a month"   Frequent headaches    GAD (generalized anxiety disorder)    GERD (gastroesophageal reflux disease)    History of acute PID 06/09/2019   Major depression in complete remission (HCC)    Migraines    PID (acute pelvic inflammatory disease)    Seasonal allergies    Tourette disorder    Urinary tract bacterial infections    Past Surgical History:  Procedure Laterality Date   CHOLECYSTECTOMY     ESOPHAGOGASTRODUODENOSCOPY     LAPAROSCOPIC CHOLECYSTECTOMY SINGLE SITE WITH INTRAOPERATIVE CHOLANGIOGRAM N/A 03/15/2016   Procedure: LAPAROSCOPIC CHOLECYSTECTOMY SINGLE SITE WITH INTRAOPERATIVE CHOLANGIOGRAM;  Surgeon: Karie Soda, MD;  Location: [facility] OR;  Service: General;  Laterality: N/A;   TONSILLECTOMY AND ADENOIDECTOMY  2013   WISDOM TOOTH EXTRACTION     Patient Active Problem List   Diagnosis Date Noted   Rubella non-immune status, antepartum 12/07/2021   Major depressive disorder, single episode, in full remission (HCC) 11/02/2021   Supervision of other normal pregnancy, antepartum 10/11/2021   History of asthma 09/23/2020   Urticaria 08/24/2020   Other adverse food reactions, not elsewhere classified, subsequent encounter 08/24/2020   Other allergic  rhinitis 08/24/2020   Exercise-induced asthma 10/27/2019   GAD (generalized anxiety disorder) 11/07/2018   Overweight (BMI 25.0-29.9) 06/03/2018   Bulimia 04/20/2015   ADD (attention deficit disorder) 01/15/2014   Tourette disease 10/18/2013    REFERRING DIAG: Z3A.33 (ICD-10-CM) - [redacted] weeks gestation of pregnancy O26.893,R10.2 (ICD-10-CM) - Pelvic pressure in pregnancy, antepartum, third trimester  THERAPY DIAG:  Muscle weakness (generalized)  Other muscle spasm  Abnormal posture  Rationale for Evaluation and Treatment Rehabilitation  ONSET DATE: [redacted] weeks pregnant  PERTINENT HISTORY: PERTINENT HISTORY:  low-risk pregnancy and has Tourette disease; ADD (attention deficit disorder); Bulimia; Overweight (BMI 25.0-29.9); GAD (generalized anxiety disorder); Exercise-induced asthma; Urticaria; Sexual abuse: No     BOWEL MOVEMENT: Pain with bowel movement: No Type of bowel movement:Type (Bristol Stool Scale) 4, Frequency daily, and Strain No Fully empty rectum: Yes:   Leakage: No Pads: No Fiber supplement: Yes: miralax and magnesium    URINATION: Pain with urination: No Fully empty bladder: Yes:   Stream: Strong Urgency: Yes:   Frequency: increased with pregnancy, sometimes 2-3x times Leakage:  full bladder with sneezing may have small leakage Pads: No   INTERCOURSE: Pain with intercourse: Initial Penetration and During Penetration Ability to have vaginal penetration:  Yes:   Climax: not painful Marinoff Scale: 1/3   PREGNANCY: Vaginal deliveries 0 Tearing No C-section deliveries 0 Currently pregnant Yes: 33 weeks  PROLAPSE: None  PRECAUTIONS: PRECAUTIONS: Other: pregnant  SUBJECTIVE:                                                                                                                                                                                      SUBJECTIVE STATEMENT:  My husband tried to tape me but it wasn't right and I couldn't walk. Took  it off after 24 hours. The last time I was here the tape was great and I wore it for 4 days.  My skin is holding up ok.  I continue to have a lot of pain when not taped.  The HEP help temporarily.     PAIN:  Are you having pain? Yes NPRS scale: 6-8/10 if walking a lot, tape helped lower pain level Pain location:  bil groins wrapping between legs   Pain type: sharp with mobility, dull with no mobility Pain description: constant    Aggravating factors: single leg tasks, side lying, car transfers, dressing, walking especially on hills Relieving factors: rest   OBJECTIVE: (objective measures completed at initial evaluation unless otherwise dated)   DIAGNOSTIC FINDINGS:    COGNITION: Overall cognitive status: Within functional limits for tasks assessed                          SENSATION: Light touch: Appears intact Proprioception: Appears intact   MUSCLE LENGTH: Bil hamstrings and adductors limited by 25%   SINGLE LEG STANCE: (+) bil for pain and hip drop    POSTURE: rounded shoulders, forward head, and anterior pelvic tilt   PELVIC ALIGNMENT: anterior tilt with limited mobility for posterior tilting    LUMBARAROM/PROM:   A/PROM A/PROM  eval  Flexion Limited by 25%  Extension Limited by 25%  Right lateral flexion Limited by 50%  Left lateral flexion Limited by 50%  Right rotation Limited by 25%  Left rotation Limited by 25%   (Blank rows = not tested)   LOWER EXTREMITY ROM:   WFL   LOWER EXTREMITY MMT:   Bil hip abduction and adduction 3+/5 however limited with pelvic pain, flexion 4/5, ext 4/5 but back pain; knees and ankles 5/5   PALPATION:   General  mild TTP at bil groin and lower abdominal quadrants with fascial restrictions                  External Perineal Exam deferred                              Internal Pelvic Floor deferred    Patient confirms identification and approves  PT to assess internal pelvic floor and treatment No   PELVIC MMT:   MMT  eval  Vaginal    Internal Anal Sphincter    External Anal Sphincter    Puborectalis    Diastasis Recti    (Blank rows = not tested)         TONE: Deferred    PROLAPSE: deferred   TODAY'S TREATMENT:                                                                                                                              DATE:  04/11/22:  Abdominal tape application with U anchor and 4 long strips anteriorly and laterally, husband present to observe (Pt bought KT tape).  Discussed need for ongoing skin observation to avoid irritation and allow skin to breathe. Pelvic ROM on ball: tail wags, circles both ways, tilts and lifts - can do at desk while working Seated on green ball frog stretch 2x20": forward flex in bil hip ER, grab inside of ankles and press outward with elbows on inside of knees Stagger stance single arm row 5lb cable pulley 1x15 each side Mini squat with diagonal 5lb kbell chop/reverse chop with trunk twist x 10 each way Foot on 2nd step rock in and out in hip ER x 1' each way  04/04/22: Abdominal tape application with U anchor and 4 long strips anteriorly and laterally, husband present to observe (Pt bought KT tape).  Discussed need for ongoing skin observation to avoid irritation and allow skin to breathe. Quadruped rocking neutral hips, hips in IR, hips in ER x 5 each Quadruped mad cat with child's pose, add SB 5x5" each way Seated diaghragmatic inhale (VC and TC for wide ribcage expansion on inhale), on exhale LE ball squeeze with TA engagement - x 10 rounds Sit to stand with TA and ball squeeze x10 from low mat table Seated bil UE 3lb raise to shoulder height with exhale and TA indraw SL open book (attempted leg on peanut bolster but pain so d/c'd LE positioning attempt) bil x 10 Log rolling practice with TA - able to perform without pain  03/31/22   EVAL Examination completed, findings reviewed, pt educated on POC, HEP, and manual for k-tape at abdomen. Pt  denied adhesive allergy and does have latex allergy but tape is latex free. X5 pieces used for abdominal support and decreased pain at pelvis. Pt also educated on log rolling technique for bed mobility to decreased strain at pelvic and pain. Pt motivated to participate in PT and agreeable to attempt recommendations.       PATIENT EDUCATION:  Education details: [email or web address] Person educated: Patient Education method: Explanation, Demonstration, Tactile cues, Verbal cues, and Handouts Education comprehension: verbalized understanding and returned demonstration   HOME EXERCISE PROGRAM: 04/04/22:  Access Code: [email or web address] URL: [email or web address] Date: 04/04/2022 Prepared by: Loistine SimasJohanna Yevonne Yokum  Exercises - Quadruped Rocking Slow  - 1 x daily - 7 x weekly -  1 sets - 5 reps - 5 hold - Cat to Child's Pose with Posterior Pelvic Tilt  - 1 x daily - 7 x weekly - 1 sets - 5 reps - 5 hold - Sit to Stand with Ball Between Knees  - 1 x daily - 7 x weekly - 1 sets - 1 reps - 10 hold - Seated Shoulder Flexion with Dumbbells  - 1 x daily - 7 x weekly - 1 sets - 1 reps - 10 hold  Evaluation: Access Code: [email or web address] URL: [email or web address] Date: 04/04/2022 Prepared by: Morton Peters  Program Notes Addition: Hip shifting x10 - sitting with feet flat on ground, shift one hip forward. Bringing one knee pushed forward then return it back and then do the other knee forward.   Exercises - Seated Diaphragmatic Breathing  - 1 x daily - 7 x weekly - 1 sets - 10 reps - Quadruped Abdominal and Pelvic Brace  - 1 x daily - 7 x weekly - 1 sets - 10 reps - Tail Wag  - 1 x daily - 7 x weekly - 1 sets - 10 reps - 3s holds - Quadruped Cat Cow  - 1 x daily - 7 x weekly - 1 sets - 10 reps - 3s holds - Unilateral Supported Supine Butterfly Stretch  - 1 x daily - 7 x weekly - 1 sets - 2 reps - 30s holds - Supine Hip Internal and External Rotation  - 1 x daily - 7 x weekly - 1 sets - 2 reps - 30s  holds - Sidelying Open Book Thoracic Rotation with Knee on Foam Roll  - 1 x daily - 7 x weekly - 1 sets - 2 reps - 30s holds - Seated Alternating Side Stretch with Arm Overhead  - 1 x daily - 7 x weekly - 1 sets - 2 reps - 30s holds - Pelvic Tilt on Swiss Ball  - 1 x daily - 7 x weekly - 1 sets - 10 reps   ASSESSMENT:   CLINICAL IMPRESSION: Patient reports KT tape continues to help a lot.  Her husband tried taping her but the tension didn't feel right and pain increased significantly.  Pt is still recovering from increased pain with that, rating pain 6-8/10 in Rt anterior hip and groin.  PT trialed external sample belly band today but Pt did not find relief the way tape gives her relief.  She has a physioball she can sit on for work at home so PT encouraged and instructed pelvic ROM on ball.  Pt is able to perform posterior sling strengthening today without pain and good form.  She continues to be compliant using HEP as "movement medicine" to work on hip ROM and pelvic opening with temporary relief.  Continue along POC.   OBJECTIVE IMPAIRMENTS: decreased coordination, decreased endurance, decreased mobility, decreased strength, increased fascial restrictions, increased muscle spasms, impaired flexibility, improper body mechanics, postural dysfunction, and pain.    ACTIVITY LIMITATIONS: carrying, lifting, bending, sitting, standing, squatting, sleeping, stairs, transfers, bed mobility, dressing, and locomotion level   PARTICIPATION LIMITATIONS: interpersonal relationship, driving, shopping, community activity, and occupation   PERSONAL FACTORS: Fitness, Time since onset of injury/illness/exacerbation, and 1 comorbidity: pregnant  are also affecting patient's functional outcome.    REHAB POTENTIAL: Good   CLINICAL DECISION MAKING: Stable/uncomplicated   EVALUATION COMPLEXITY: Low     GOALS: Goals reviewed with patient? Yes   SHORT TERM GOALS: Target date: 04/29/23   Pt to be I with  HEP.   Baseline: Goal status: ongoing   2.  Pt will report no more than 6/10 pelvic pain due to improvements in posture, strength, and muscle length  Baseline:  Goal status: ongoing   3.  Pt to demonstrate I  body mechanics with bed mobility, transfers, standing and sitting posture to decrease pain at back and pelvis during pregnancy without cues.  Baseline:  Goal status: ongoing   LONG TERM GOALS: Target date: 05/16/22   Pt to be I with advanced HEP.  Baseline:  Goal status: INITIAL   2.  Pt will report no more than 4/10 pelvic pain due to improvements in posture, strength, and muscle length  Baseline:  Goal status: INITIAL   3.  Pt to be I with coordination of breathing mechanics and core activation for improved pelvic stability with standing and single leg tasks to decrease pain at back and pelvis.  Baseline:  Goal status: ongoing   4.  Pt to demonstrate at least 5/5 bil hip strength for improved pelvic stability and functional squats without leakage.  Baseline:  Goal status: INITIAL     PLAN:   PT FREQUENCY: 1-2x/week   PT DURATION: other: 5 weeks   PLANNED INTERVENTIONS: Therapeutic exercises, Therapeutic activity, Neuromuscular re-education, Patient/Family education, Self Care, Joint mobilization, DME instructions, Aquatic Therapy, Dry Needling, Spinal mobilization, Cryotherapy, Moist heat, scar mobilization, Taping, Biofeedback, and Manual therapy   PLAN FOR NEXT SESSION: continue KT taping for abdominal support during pregnancy as long as skin allows, TA activations with transfers and rolling for decreased strain/coning at abdomen, diaphragmatic breathing training for rib mobility and core activations, glute and opp lat activations for strengthening posterior sling, spine and hip stretching, hip IR mobility and strengthening.    Maximina Pirozzi, PT 04/11/22 8:46 AM

## 2022-04-12 ENCOUNTER — Ambulatory Visit (INDEPENDENT_AMBULATORY_CARE_PROVIDER_SITE_OTHER): Payer: No Typology Code available for payment source | Admitting: Clinical

## 2022-04-12 DIAGNOSIS — F411 Generalized anxiety disorder: Secondary | ICD-10-CM

## 2022-04-12 DIAGNOSIS — F988 Other specified behavioral and emotional disorders with onset usually occurring in childhood and adolescence: Secondary | ICD-10-CM

## 2022-04-12 DIAGNOSIS — Z8659 Personal history of other mental and behavioral disorders: Secondary | ICD-10-CM

## 2022-04-12 NOTE — Patient Instructions (Signed)
[facility] [address] [place], [place] [address] [phone] (main office) [phone] (Pyper Olexa's office)   

## 2022-04-13 ENCOUNTER — Ambulatory Visit: Payer: No Typology Code available for payment source | Admitting: Physical Therapy

## 2022-04-13 DIAGNOSIS — M62838 Other muscle spasm: Secondary | ICD-10-CM

## 2022-04-13 DIAGNOSIS — M6281 Muscle weakness (generalized): Secondary | ICD-10-CM

## 2022-04-13 DIAGNOSIS — R293 Abnormal posture: Secondary | ICD-10-CM

## 2022-04-13 DIAGNOSIS — O26893 Other specified pregnancy related conditions, third trimester: Secondary | ICD-10-CM | POA: Diagnosis not present

## 2022-04-13 NOTE — Therapy (Signed)
OUTPATIENT PHYSICAL THERAPY TREATMENT NOTE   Patient Name: Cindy Jones MRN: [id number] DOB:1990/10/22, 31 y.o., female Today's Date: 04/13/2022  PCP: Reva Bores, MD REFERRING PROVIDER: Dorathy Kinsman, CNM  END OF SESSION:   PT End of Session - 04/13/22 0849     Visit Number 4    Date for PT Re-Evaluation 05/16/22    Authorization Type [employer]    PT Start Time 0849    PT Stop Time 0927    PT Time Calculation (min) 38 min    Activity Tolerance Patient tolerated treatment well              Past Medical History:  Diagnosis Date   Anxiety    Asthma    as a child   Binge eating disorder    Dysrhythmia    " I feel it about once a month"   Frequent headaches    GAD (generalized anxiety disorder)    GERD (gastroesophageal reflux disease)    History of acute PID 06/09/2019   Major depression in complete remission (HCC)    Migraines    PID (acute pelvic inflammatory disease)    Seasonal allergies    Tourette disorder    Urinary tract bacterial infections    Past Surgical History:  Procedure Laterality Date   CHOLECYSTECTOMY     ESOPHAGOGASTRODUODENOSCOPY     LAPAROSCOPIC CHOLECYSTECTOMY SINGLE SITE WITH INTRAOPERATIVE CHOLANGIOGRAM N/A 03/15/2016   Procedure: LAPAROSCOPIC CHOLECYSTECTOMY SINGLE SITE WITH INTRAOPERATIVE CHOLANGIOGRAM;  Surgeon: Karie Soda, MD;  Location: [facility] OR;  Service: General;  Laterality: N/A;   TONSILLECTOMY AND ADENOIDECTOMY  2013   WISDOM TOOTH EXTRACTION     Patient Active Problem List   Diagnosis Date Noted   Rubella non-immune status, antepartum 12/07/2021   Major depressive disorder, single episode, in full remission (HCC) 11/02/2021   Supervision of other normal pregnancy, antepartum 10/11/2021   History of asthma 09/23/2020   Urticaria 08/24/2020   Other adverse food reactions, not elsewhere classified, subsequent encounter 08/24/2020   Other allergic rhinitis 08/24/2020   Exercise-induced asthma 10/27/2019   GAD  (generalized anxiety disorder) 11/07/2018   Overweight (BMI 25.0-29.9) 06/03/2018   Bulimia 04/20/2015   ADD (attention deficit disorder) 01/15/2014   Tourette disease 10/18/2013    REFERRING DIAG: Z3A.33 (ICD-10-CM) - [redacted] weeks gestation of pregnancy O26.893,R10.2 (ICD-10-CM) - Pelvic pressure in pregnancy, antepartum, third trimester  THERAPY DIAG:  Muscle weakness (generalized)  Other muscle spasm  Abnormal posture  Rationale for Evaluation and Treatment Rehabilitation  ONSET DATE: [redacted] weeks pregnant  PERTINENT HISTORY: PERTINENT HISTORY:  low-risk pregnancy and has Tourette disease; ADD (attention deficit disorder); Bulimia; Overweight (BMI 25.0-29.9); GAD (generalized anxiety disorder); Exercise-induced asthma; Urticaria; Sexual abuse: No     BOWEL MOVEMENT: Pain with bowel movement: No Type of bowel movement:Type (Bristol Stool Scale) 4, Frequency daily, and Strain No Fully empty rectum: Yes:   Leakage: No Pads: No Fiber supplement: Yes: miralax and magnesium    URINATION: Pain with urination: No Fully empty bladder: Yes:   Stream: Strong Urgency: Yes:   Frequency: increased with pregnancy, sometimes 2-3x times Leakage:  full bladder with sneezing may have small leakage Pads: No   INTERCOURSE: Pain with intercourse: Initial Penetration and During Penetration Ability to have vaginal penetration:  Yes:   Climax: not painful Marinoff Scale: 1/3   PREGNANCY: Vaginal deliveries 0 Tearing No C-section deliveries 0 Currently pregnant Yes: 33 weeks   PROLAPSE: None  PRECAUTIONS: PRECAUTIONS: Other: pregnant  SUBJECTIVE:  SUBJECTIVE STATEMENT:  I have an U/S tomorrow so I probably shouldn't tape today.  Squats aren't the most comfortable thing.  The ball seems kind of easy.  Has a  ball at home.    PAIN:  Are you having pain? Yes NPRS scale: 3-4/10 if walking a lot, tape helped lower pain level Pain location:  bil groins wrapping between legs   Pain type: sharp with mobility, dull with no mobility Pain description: constant    Aggravating factors: single leg tasks, side lying, car transfers, dressing, walking especially on hills Relieving factors: rest   OBJECTIVE: (objective measures completed at initial evaluation unless otherwise dated)   DIAGNOSTIC FINDINGS:    COGNITION: Overall cognitive status: Within functional limits for tasks assessed                          SENSATION: Light touch: Appears intact Proprioception: Appears intact   MUSCLE LENGTH: Bil hamstrings and adductors limited by 25%   SINGLE LEG STANCE: (+) bil for pain and hip drop    POSTURE: rounded shoulders, forward head, and anterior pelvic tilt   PELVIC ALIGNMENT: anterior tilt with limited mobility for posterior tilting    LUMBARAROM/PROM:   A/PROM A/PROM  eval  Flexion Limited by 25%  Extension Limited by 25%  Right lateral flexion Limited by 50%  Left lateral flexion Limited by 50%  Right rotation Limited by 25%  Left rotation Limited by 25%   (Blank rows = not tested)   LOWER EXTREMITY ROM:   WFL   LOWER EXTREMITY MMT:   Bil hip abduction and adduction 3+/5 however limited with pelvic pain, flexion 4/5, ext 4/5 but back pain; knees and ankles 5/5   PALPATION:   General  mild TTP at bil groin and lower abdominal quadrants with fascial restrictions                  External Perineal Exam deferred                              Internal Pelvic Floor deferred    Patient confirms identification and approves PT to assess internal pelvic floor and treatment No   PELVIC MMT:   MMT eval  Vaginal    Internal Anal Sphincter    External Anal Sphincter    Puborectalis    Diastasis Recti    (Blank rows = not tested)         TONE: Deferred     PROLAPSE: deferred   TODAY'S TREATMENT:                                                                                                                              DATE:  12/14: Deferred taping since she has an U/S tomorrow; review of basic tape tensioning not pulling to the very ends Sitting on ball: circles, hip adductor  stretch, tried hip flexor stretch on ball but uncomfortable so discontinued Sitting on ball marching 10x Quadruped childs pose Quadruped adductor stretch Long V sit stretch Therapeutic activity: seated posture; Sleeping sidelying positioning with pillows to make a valley under greater trochanter      04/11/22:  Abdominal tape application with U anchor and 4 long strips anteriorly and laterally, husband present to observe (Pt bought KT tape).  Discussed need for ongoing skin observation to avoid irritation and allow skin to breathe. Pelvic ROM on ball: tail wags, circles both ways, tilts and lifts - can do at desk while working Seated on green ball frog stretch 2x20": forward flex in bil hip ER, grab inside of ankles and press outward with elbows on inside of knees Stagger stance single arm row 5lb cable pulley 1x15 each side Mini squat with diagonal 5lb kbell chop/reverse chop with trunk twist x 10 each way Foot on 2nd step rock in and out in hip ER x 1' each way  04/04/22: Abdominal tape application with U anchor and 4 long strips anteriorly and laterally, husband present to observe (Pt bought KT tape).  Discussed need for ongoing skin observation to avoid irritation and allow skin to breathe. Quadruped rocking neutral hips, hips in IR, hips in ER x 5 each Quadruped mad cat with child's pose, add SB 5x5" each way Seated diaghragmatic inhale (VC and TC for wide ribcage expansion on inhale), on exhale LE ball squeeze with TA engagement - x 10 rounds Sit to stand with TA and ball squeeze x10 from low mat table Seated bil UE 3lb raise to shoulder height with exhale  and TA indraw SL open book (attempted leg on peanut bolster but pain so d/c'd LE positioning attempt) bil x 10 Log rolling practice with TA - able to perform without pain  03/31/22   EVAL Examination completed, findings reviewed, pt educated on POC, HEP, and manual for k-tape at abdomen. Pt denied adhesive allergy and does have latex allergy but tape is latex free. X5 pieces used for abdominal support and decreased pain at pelvis. Pt also educated on log rolling technique for bed mobility to decreased strain at pelvic and pain. Pt motivated to participate in PT and agreeable to attempt recommendations.       PATIENT EDUCATION:  Education details: [email or web address] Person educated: Patient Education method: Explanation, Demonstration, Tactile cues, Verbal cues, and Handouts Education comprehension: verbalized understanding and returned demonstration   HOME EXERCISE PROGRAM: 04/04/22:  Access Code: [email or web address] URL: [email or web address] Date: 04/04/2022 Prepared by: Loistine Simas Beuhring  Exercises - Quadruped Rocking Slow  - 1 x daily - 7 x weekly - 1 sets - 5 reps - 5 hold - Cat to Child's Pose with Posterior Pelvic Tilt  - 1 x daily - 7 x weekly - 1 sets - 5 reps - 5 hold - Sit to Stand with Ball Between Knees  - 1 x daily - 7 x weekly - 1 sets - 1 reps - 10 hold - Seated Shoulder Flexion with Dumbbells  - 1 x daily - 7 x weekly - 1 sets - 1 reps - 10 hold  Evaluation: Access Code: [email or web address] URL: [email or web address] Date: 04/04/2022 Prepared by: Morton Peters  Program Notes Addition: Hip shifting x10 - sitting with feet flat on ground, shift one hip forward. Bringing one knee pushed forward then return it back and then do the other knee forward.   Exercises - Seated Diaphragmatic Breathing  - 1 x daily - 7 x weekly -  1 sets - 10 reps - Quadruped Abdominal and Pelvic Brace  - 1 x daily - 7 x weekly - 1 sets - 10 reps - Tail Wag  - 1 x daily - 7 x weekly - 1 sets -  10 reps - 3s holds - Quadruped Cat Cow  - 1 x daily - 7 x weekly - 1 sets - 10 reps - 3s holds - Unilateral Supported Supine Butterfly Stretch  - 1 x daily - 7 x weekly - 1 sets - 2 reps - 30s holds - Supine Hip Internal and External Rotation  - 1 x daily - 7 x weekly - 1 sets - 2 reps - 30s holds - Sidelying Open Book Thoracic Rotation with Knee on Foam Roll  - 1 x daily - 7 x weekly - 1 sets - 2 reps - 30s holds - Seated Alternating Side Stretch with Arm Overhead  - 1 x daily - 7 x weekly - 1 sets - 2 reps - 30s holds - Pelvic Tilt on Swiss Ball  - 1 x daily - 7 x weekly - 1 sets - 10 reps   ASSESSMENT:   CLINICAL IMPRESSION: Patient deferred taping today secondary to U/S tomorrow.  Treatment focus on lengthening of hip adductors and pelvic mobility ex's for pain relief.  Patient education also provided for sitting postural considerations and sidelying pillow set up to decrease greater trochanteric bursa compression for improved sleep these last few weeks of her pregnancy.  Limited strengthening/stability today secondary to patient wearing a sweater today and became overheated while exercising in the gym.    OBJECTIVE IMPAIRMENTS: decreased coordination, decreased endurance, decreased mobility, decreased strength, increased fascial restrictions, increased muscle spasms, impaired flexibility, improper body mechanics, postural dysfunction, and pain.    ACTIVITY LIMITATIONS: carrying, lifting, bending, sitting, standing, squatting, sleeping, stairs, transfers, bed mobility, dressing, and locomotion level   PARTICIPATION LIMITATIONS: interpersonal relationship, driving, shopping, community activity, and occupation   PERSONAL FACTORS: Fitness, Time since onset of injury/illness/exacerbation, and 1 comorbidity: pregnant  are also affecting patient's functional outcome.    REHAB POTENTIAL: Good   CLINICAL DECISION MAKING: Stable/uncomplicated   EVALUATION COMPLEXITY: Low     GOALS: Goals  reviewed with patient? Yes   SHORT TERM GOALS: Target date: 04/29/23   Pt to be I with HEP.  Baseline: Goal status: ongoing   2.  Pt will report no more than 6/10 pelvic pain due to improvements in posture, strength, and muscle length  Baseline:  Goal status: ongoing   3.  Pt to demonstrate I  body mechanics with bed mobility, transfers, standing and sitting posture to decrease pain at back and pelvis during pregnancy without cues.  Baseline:  Goal status: ongoing   LONG TERM GOALS: Target date: 05/16/22   Pt to be I with advanced HEP.  Baseline:  Goal status: INITIAL   2.  Pt will report no more than 4/10 pelvic pain due to improvements in posture, strength, and muscle length  Baseline:  Goal status: INITIAL   3.  Pt to be I with coordination of breathing mechanics and core activation for improved pelvic stability with standing and single leg tasks to decrease pain at back and pelvis.  Baseline:  Goal status: ongoing   4.  Pt to demonstrate at least 5/5 bil hip strength for improved pelvic stability and functional squats without leakage.  Baseline:  Goal status: INITIAL     PLAN:   PT FREQUENCY: 1-2x/week   PT DURATION:  other: 5 weeks   PLANNED INTERVENTIONS: Therapeutic exercises, Therapeutic activity, Neuromuscular re-education, Patient/Family education, Self Care, Joint mobilization, DME instructions, Aquatic Therapy, Dry Needling, Spinal mobilization, Cryotherapy, Moist heat, scar mobilization, Taping, Biofeedback, and Manual therapy   PLAN FOR NEXT SESSION: continue KT taping for abdominal support during pregnancy as long as skin allows, TA activations with transfers and rolling for decreased strain/coning at abdomen, diaphragmatic breathing training for rib mobility and core activations, glute and opp lat activations for strengthening posterior sling, spine and hip stretching, hip IR mobility and strengthening.   Lavinia Sharps, PT 04/13/22 3:19 PM Phone:  [phone] Fax: [phone]

## 2022-04-14 ENCOUNTER — Ambulatory Visit (INDEPENDENT_AMBULATORY_CARE_PROVIDER_SITE_OTHER): Payer: No Typology Code available for payment source | Admitting: Family Medicine

## 2022-04-14 ENCOUNTER — Encounter: Payer: Self-pay | Admitting: Family Medicine

## 2022-04-14 ENCOUNTER — Ambulatory Visit: Payer: No Typology Code available for payment source | Attending: Maternal & Fetal Medicine

## 2022-04-14 ENCOUNTER — Ambulatory Visit: Payer: No Typology Code available for payment source | Admitting: *Deleted

## 2022-04-14 ENCOUNTER — Other Ambulatory Visit: Payer: Self-pay

## 2022-04-14 VITALS — BP 116/66 | HR 72

## 2022-04-14 VITALS — BP 104/66 | HR 90 | Wt 236.6 lb

## 2022-04-14 DIAGNOSIS — O368330 Maternal care for abnormalities of the fetal heart rate or rhythm, third trimester, not applicable or unspecified: Secondary | ICD-10-CM

## 2022-04-14 DIAGNOSIS — E669 Obesity, unspecified: Secondary | ICD-10-CM

## 2022-04-14 DIAGNOSIS — Z2839 Other underimmunization status: Secondary | ICD-10-CM

## 2022-04-14 DIAGNOSIS — O09893 Supervision of other high risk pregnancies, third trimester: Secondary | ICD-10-CM

## 2022-04-14 DIAGNOSIS — F411 Generalized anxiety disorder: Secondary | ICD-10-CM

## 2022-04-14 DIAGNOSIS — O09891 Supervision of other high risk pregnancies, first trimester: Secondary | ICD-10-CM

## 2022-04-14 DIAGNOSIS — O09899 Supervision of other high risk pregnancies, unspecified trimester: Secondary | ICD-10-CM

## 2022-04-14 DIAGNOSIS — O99213 Obesity complicating pregnancy, third trimester: Secondary | ICD-10-CM

## 2022-04-14 DIAGNOSIS — Z3A35 35 weeks gestation of pregnancy: Secondary | ICD-10-CM | POA: Diagnosis not present

## 2022-04-14 DIAGNOSIS — Z362 Encounter for other antenatal screening follow-up: Secondary | ICD-10-CM | POA: Diagnosis not present

## 2022-04-14 DIAGNOSIS — Z3483 Encounter for supervision of other normal pregnancy, third trimester: Secondary | ICD-10-CM

## 2022-04-14 DIAGNOSIS — Z3480 Encounter for supervision of other normal pregnancy, unspecified trimester: Secondary | ICD-10-CM

## 2022-04-14 DIAGNOSIS — O368390 Maternal care for abnormalities of the fetal heart rate or rhythm, unspecified trimester, not applicable or unspecified: Secondary | ICD-10-CM

## 2022-04-14 NOTE — Patient Instructions (Signed)
Postpartum Baby Blues The postpartum period begins right after the birth of a baby. During this time, there is often joy and excitement. It is also a time of many changes in the life of the parents. A mother may feel happy one minute and sad or stressed the next. These feelings of sadness, called the baby blues, usually happen in the period right after the baby is born and go away within a week or two. What are the causes? The exact cause of this condition is not known. Changes in hormone levels after childbirth are believed to trigger some of the symptoms. Other factors that can play a role in these mood changes include: Lack of sleep. Stressful life events, such as financial problems, caring for a loved one, or death of a loved one. Genetics. What are the signs or symptoms? Symptoms of this condition include: Changes in mood, such as going from extreme happiness to sadness. A decrease in concentration. Difficulty sleeping. Crying spells and tearfulness. Loss of appetite. Irritability. Anxiety. If these symptoms last for more than 2 weeks or become more severe, you may have postpartum depression. How is this diagnosed? This condition is diagnosed based on an evaluation of your symptoms. Your health care provider may use a screening tool that includes a list of questions to help identify a person with the baby blues or postpartum depression. How is this treated? The baby blues usually go away on their own in 1-2 weeks. Social support is often what is needed. You will be encouraged to get adequate sleep and rest. Follow these instructions at home: Lifestyle     Get as much rest as you can. Take a nap when the baby sleeps. Exercise regularly as told by your health care provider. Some women find yoga and walking to be helpful. Eat a balanced and nourishing diet. This includes plenty of fruits and vegetables, whole grains, and lean proteins. Do little things that you enjoy. Take a bubble  bath, read your favorite magazine, or listen to your favorite music. Avoid alcohol. Ask for help with household chores, cooking, grocery shopping, or running errands. Do not try to do everything yourself. Consider hiring a postpartum doula to help. This is a professional who specializes in providing support to new mothers. Try not to make any major life changes during pregnancy or right after giving birth. This can add stress. General instructions Talk to people close to you about how you are feeling. Get support from your partner, family members, friends, or other new moms. You may want to join a support group. Find ways to manage stress. This may include: Writing your thoughts and feelings in a journal. Spending time outside. Spending time with people who make you laugh. Try to stay positive in how you think. Think about the things you are grateful for. Take over-the-counter and prescription medicines only as told by your health care provider. Let your health care provider know if you have any concerns. Keep all postpartum visits. This is important. Contact a health care provider if: Your baby blues do not go away after 2 weeks. Get help right away if: You have thoughts of taking your own life (suicidal thoughts), or of harming your baby or someone else. You see or hear things that are not there (hallucinations). If you ever feel like you may hurt yourself or others, or have thoughts about taking your own life, get help right away. Go to your nearest emergency department or: Call your local emergency services (911   in the [place] Call a suicide crisis helpline, such as the National Suicide Prevention Lifeline, at 1-800-273-8255 or 988 in the [place] This is open 24 hours a day in the [place] Text the Crisis Text Line at 741741 (in the [place] Summary After giving birth, you may feel happy one minute and sad or stressed the next. Feelings of sadness that happen right after the baby is born and go  away after a week or two are called the baby blues. You can manage the baby blues by getting enough rest, eating a healthy diet, exercising, spending time with supportive people, and finding ways to manage stress. If feelings of sadness and stress last longer than 2 weeks or get in the way of caring for your baby, talk with your health care provider. This may mean you have postpartum depression. This information is not intended to replace advice given to you by your health care provider. Make sure you discuss any questions you have with your health care provider. Document Revised: 11/10/2020 Document Reviewed: 10/10/2019 Elsevier Patient Education  2023 [employer] Inc.  

## 2022-04-14 NOTE — Addendum Note (Signed)
Addended by: Jill Side on: 04/14/2022 10:44 AM   Modules accepted: Orders

## 2022-04-14 NOTE — Progress Notes (Signed)
   Subjective:  Cindy Jones is a 31 y.o. G2P0010 at 4w3dbeing seen today for ongoing prenatal care.  She is currently monitored for the following issues for this low-risk pregnancy and has Tourette disease; ADD (attention deficit disorder); Bulimia; Overweight (BMI 25.0-29.9); GAD (generalized anxiety disorder); Exercise-induced asthma; Urticaria; Other adverse food reactions, not elsewhere classified, subsequent encounter; Other allergic rhinitis; History of asthma; Supervision of other normal pregnancy, antepartum; Major depressive disorder, single episode, in full remission ([facility]; and Rubella non-immune status, antepartum on their problem list.  Patient reports no complaints.  Contractions: Irritability. Vag. Bleeding: None.  Movement: Present. Denies leaking of fluid.   The following portions of the patient's history were reviewed and updated as appropriate: allergies, current medications, past family history, past medical history, past social history, past surgical history and problem list. Problem list updated.  Objective:   Vitals:   04/14/22 0911  BP: 104/66  Pulse: 90  Weight: 236 lb 9.6 oz (107.3 kg)    Fetal Status: Fetal Heart Rate (bpm): 174   Movement: Present     General:  Alert, oriented and cooperative. Patient is in no acute distress.  Skin: Skin is warm and dry. No rash noted.   Cardiovascular: Normal heart rate noted  Respiratory: Normal respiratory effort, no problems with respiration noted  Abdomen: Soft, gravid, appropriate for gestational age. Pain/Pressure: Present     Pelvic: Vag. Bleeding: None     Cervical exam deferred        Extremities: Normal range of motion.     Mental Status: Normal mood and affect. Normal behavior. Normal judgment and thought content.   Urinalysis:      Assessment and Plan:  Pregnancy: G2P0010 at 372w3d1. Supervision of other normal pregnancy, antepartum BP normal FHR mildly elevated, suspect vigorous accels, placed on  monitor for NST which showed baseline 155-160, normal variability, accels, overall Cat I and very reassuring Swabs next visit Wanted to discuss IOL, reviewed 39 weeks and after is fine She would like to schedule for about 39 weeks as she knows it can get pushed back. 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 Rubella non-immune status, antepartum Offer MMR PP  3. GAD (generalized anxiety disorder) Stable, following w Jamie  Preterm labor symptoms and general obstetric precautions including but not limited to vaginal bleeding, contractions, leaking of fluid and fetal movement were reviewed in detail with the patient. Please refer to After Visit Summary for other counseling recommendations.  Return in 1 week (on 04/21/2022) for [facility] patient, ob visit.   EcClarnce FlockMD

## 2022-04-18 ENCOUNTER — Ambulatory Visit: Payer: No Typology Code available for payment source | Admitting: Physical Therapy

## 2022-04-18 DIAGNOSIS — O26893 Other specified pregnancy related conditions, third trimester: Secondary | ICD-10-CM | POA: Diagnosis not present

## 2022-04-18 DIAGNOSIS — R279 Unspecified lack of coordination: Secondary | ICD-10-CM

## 2022-04-18 DIAGNOSIS — M62838 Other muscle spasm: Secondary | ICD-10-CM

## 2022-04-18 DIAGNOSIS — R293 Abnormal posture: Secondary | ICD-10-CM

## 2022-04-18 DIAGNOSIS — M6281 Muscle weakness (generalized): Secondary | ICD-10-CM

## 2022-04-18 NOTE — Therapy (Signed)
OUTPATIENT PHYSICAL THERAPY TREATMENT NOTE   Patient Name: Cindy Jones MRN: [id number] DOB:Dec 12, 1990, 31 y.o., female Today's Date: 04/18/2022  PCP: Donnamae Jude, MD REFERRING PROVIDER: Manya Silvas, [place] OF SESSION:   PT End of Session - 04/18/22 1532     Visit Number 5    Date for PT Re-Evaluation 05/16/22    Authorization Type [employer] employee focus    PT Start Time 1532    PT Stop Time 1613    PT Time Calculation (min) 41 min    Activity Tolerance Patient tolerated treatment well              Past Medical History:  Diagnosis Date   Anxiety    Asthma    as a child   Binge eating disorder    Dysrhythmia    " I feel it about once a month"   Frequent headaches    GAD (generalized anxiety disorder)    GERD (gastroesophageal reflux disease)    History of acute PID 06/09/2019   Major depression in complete remission ([place])    Migraines    PID (acute pelvic inflammatory disease)    Seasonal allergies    Tourette disorder    Urinary tract bacterial infections    Past Surgical History:  Procedure Laterality Date   CHOLECYSTECTOMY     ESOPHAGOGASTRODUODENOSCOPY     LAPAROSCOPIC CHOLECYSTECTOMY SINGLE SITE WITH INTRAOPERATIVE CHOLANGIOGRAM N/A 03/15/2016   Procedure: LAPAROSCOPIC CHOLECYSTECTOMY SINGLE SITE WITH INTRAOPERATIVE CHOLANGIOGRAM;  Surgeon: Michael Boston, MD;  Location: [facility];  Service: General;  Laterality: N/A;   TONSILLECTOMY AND ADENOIDECTOMY  2013   WISDOM TOOTH EXTRACTION     Patient Active Problem List   Diagnosis Date Noted   Rubella non-immune status, antepartum 12/07/2021   Major depressive disorder, single episode, in full remission ([place]) 11/02/2021   Supervision of other normal pregnancy, antepartum 10/11/2021   History of asthma 09/23/2020   Urticaria 08/24/2020   Other adverse food reactions, not elsewhere classified, subsequent encounter 08/24/2020   Other allergic rhinitis 08/24/2020   Exercise-induced asthma 10/27/2019   GAD  (generalized anxiety disorder) 11/07/2018   Overweight (BMI 25.0-29.9) 06/03/2018   Bulimia 04/20/2015   ADD (attention deficit disorder) 01/15/2014   Tourette disease 10/18/2013    REFERRING DIAG: Z3A.33 (ICD-10-CM) - [redacted] weeks gestation of pregnancy O26.893,R10.2 (ICD-10-CM) - Pelvic pressure in pregnancy, antepartum, third trimester  THERAPY DIAG:  Muscle weakness (generalized)  Other muscle spasm  Abnormal posture  Unspecified lack of coordination  Rationale for Evaluation and Treatment Rehabilitation  ONSET DATE: [redacted] weeks pregnant  PERTINENT HISTORY: PERTINENT HISTORY:  low-risk pregnancy and has Tourette disease; ADD (attention deficit disorder); Bulimia; Overweight (BMI 25.0-29.9); GAD (generalized anxiety disorder); Exercise-induced asthma; Urticaria; Sexual abuse: No     BOWEL MOVEMENT: Pain with bowel movement: No Type of bowel movement:Type (Bristol Stool Scale) 4, Frequency daily, and Strain No Fully empty rectum: Yes:   Leakage: No Pads: No Fiber supplement: Yes: miralax and magnesium    URINATION: Pain with urination: No Fully empty bladder: Yes:   Stream: Strong Urgency: Yes:   Frequency: increased with pregnancy, sometimes 2-3x times Leakage:  full bladder with sneezing may have small leakage Pads: No   INTERCOURSE: Pain with intercourse: Initial Penetration and During Penetration Ability to have vaginal penetration:  Yes:   Climax: not painful Marinoff Scale: 1/3   PREGNANCY: Vaginal deliveries 0 Tearing No C-section deliveries 0 Currently pregnant Yes: 33 weeks   PROLAPSE: None  PRECAUTIONS: PRECAUTIONS:  Other: pregnant  SUBJECTIVE:                                                                                                                                                                                      SUBJECTIVE STATEMENT:  Tape has been really helpful and has been doing HEP which helps. Pt reports she does have to modify  activity as needed but much more comfortable.   PAIN:  Are you having pain? Yes NPRS scale: 3/10   Pain type: sharp with mobility, dull with no mobility Pain description: constant    Aggravating factors: single leg tasks, side lying, car transfers, dressing, walking especially on hills Relieving factors: rest   OBJECTIVE: (objective measures completed at initial evaluation unless otherwise dated)   DIAGNOSTIC FINDINGS:    COGNITION: Overall cognitive status: Within functional limits for tasks assessed                          SENSATION: Light touch: Appears intact Proprioception: Appears intact   MUSCLE LENGTH: Bil hamstrings and adductors limited by 25%   SINGLE LEG STANCE: (+) bil for pain and hip drop    POSTURE: rounded shoulders, forward head, and anterior pelvic tilt   PELVIC ALIGNMENT: anterior tilt with limited mobility for posterior tilting    LUMBARAROM/PROM:   A/PROM A/PROM  eval  Flexion Limited by 25%  Extension Limited by 25%  Right lateral flexion Limited by 50%  Left lateral flexion Limited by 50%  Right rotation Limited by 25%  Left rotation Limited by 25%   (Blank rows = not tested)   LOWER EXTREMITY ROM:   WFL   LOWER EXTREMITY MMT:   Bil hip abduction and adduction 3+/5 however limited with pelvic pain, flexion 4/5, ext 4/5 but back pain; knees and ankles 5/5   PALPATION:   General  mild TTP at bil groin and lower abdominal quadrants with fascial restrictions                  External Perineal Exam deferred                              Internal Pelvic Floor deferred    Patient confirms identification and approves PT to assess internal pelvic floor and treatment No   PELVIC MMT:   MMT eval  Vaginal    Internal Anal Sphincter    External Anal Sphincter    Puborectalis    Diastasis Recti    (Blank rows = not tested)         TONE: Deferred  PROLAPSE: deferred   TODAY'S TREATMENT:                                                                                                                               DATE:  04/18/22:  Taping at abdominal support, 5 pieces of tape for decreased pain Sitting on ball: ant/post x10, circle x10 each way,  adductor rocks x10 each Hip shifts on quad with yoga block x10 each Quad diagonal rocks with yoga block x10 each         PATIENT EDUCATION:  Education details: [place] educated: Patient Education method: Consulting civil engineer, Demonstration, Corporate treasurer cues, Verbal cues, and Handouts Education comprehension: verbalized understanding and returned demonstration   HOME EXERCISE PROGRAM: 04/04/22:  Access Code: [email or web address] URL: [email or web address] Date: 04/04/2022 Prepared by: Venetia Night Beuhring  Exercises - Quadruped Rocking Slow  - 1 x daily - 7 x weekly - 1 sets - 5 reps - 5 hold - Cat to Child's Pose with Posterior Pelvic Tilt  - 1 x daily - 7 x weekly - 1 sets - 5 reps - 5 hold - Sit to Stand with Ball Between Knees  - 1 x daily - 7 x weekly - 1 sets - 1 reps - 10 hold - Seated Shoulder Flexion with Dumbbells  - 1 x daily - 7 x weekly - 1 sets - 1 reps - 10 hold  Evaluation: Access Code: [email or web address] URL: [email or web address] Date: 04/04/2022 Prepared by: Baruch Merl  Program Notes Addition: Hip shifting x10 - sitting with feet flat on ground, shift one hip forward. Bringing one knee pushed forward then return it back and then do the other knee forward.   Exercises - Seated Diaphragmatic Breathing  - 1 x daily - 7 x weekly - 1 sets - 10 reps - Quadruped Abdominal and Pelvic Brace  - 1 x daily - 7 x weekly - 1 sets - 10 reps - Tail Wag  - 1 x daily - 7 x weekly - 1 sets - 10 reps - 3s holds - Quadruped Cat Cow  - 1 x daily - 7 x weekly - 1 sets - 10 reps - 3s holds - Unilateral Supported Supine Butterfly Stretch  - 1 x daily - 7 x weekly - 1 sets - 2 reps - 30s holds - Supine Hip Internal and External Rotation  - 1 x daily - 7 x  weekly - 1 sets - 2 reps - 30s holds - Sidelying Open Book Thoracic Rotation with Knee on Foam Roll  - 1 x daily - 7 x weekly - 1 sets - 2 reps - 30s holds - Seated Alternating Side Stretch with Arm Overhead  - 1 x daily - 7 x weekly - 1 sets - 2 reps - 30s holds - Pelvic Tilt on Swiss Ball  - 1 x daily - 7 x weekly - 1 sets - 10 reps   ASSESSMENT:   CLINICAL IMPRESSION: Patient deferred taping  today secondary to U/S tomorrow.  Treatment focus on lengthening of hip adductors and pelvic mobility ex's for pain relief.  Patient education also provided for sitting postural considerations and sidelying pillow set up to decrease greater trochanteric bursa compression for improved sleep these last few weeks of her pregnancy.  Limited strengthening/stability today secondary to patient wearing a sweater today and became overheated while exercising in the gym.    OBJECTIVE IMPAIRMENTS: decreased coordination, decreased endurance, decreased mobility, decreased strength, increased fascial restrictions, increased muscle spasms, impaired flexibility, improper body mechanics, postural dysfunction, and pain.    ACTIVITY LIMITATIONS: carrying, lifting, bending, sitting, standing, squatting, sleeping, stairs, transfers, bed mobility, dressing, and locomotion level   PARTICIPATION LIMITATIONS: interpersonal relationship, driving, shopping, community activity, and occupation   PERSONAL FACTORS: Fitness, Time since onset of injury/illness/exacerbation, and 1 comorbidity: pregnant  are also affecting patient's functional outcome.    REHAB POTENTIAL: Good   CLINICAL DECISION MAKING: Stable/uncomplicated   EVALUATION COMPLEXITY: Low     GOALS: Goals reviewed with patient? Yes   SHORT TERM GOALS: Target date: 04/29/23   Pt to be I with HEP.  Baseline: Goal status: MET   2.  Pt will report no more than 6/10 pelvic pain due to improvements in posture, strength, and muscle length  Baseline:  Goal status:  MET   3.  Pt to demonstrate I  body mechanics with bed mobility, transfers, standing and sitting posture to decrease pain at back and pelvis during pregnancy without cues.  Baseline:  Goal status: ongoing   LONG TERM GOALS: Target date: 05/16/22   Pt to be I with advanced HEP.  Baseline:  Goal status: INITIAL   2.  Pt will report no more than 4/10 pelvic pain due to improvements in posture, strength, and muscle length  Baseline:  Goal status: INITIAL   3.  Pt to be I with coordination of breathing mechanics and core activation for improved pelvic stability with standing and single leg tasks to decrease pain at back and pelvis.  Baseline:  Goal status: ongoing   4.  Pt to demonstrate at least 5/5 bil hip strength for improved pelvic stability and functional squats without leakage.  Baseline:  Goal status: INITIAL     PLAN:   PT FREQUENCY: 1-2x/week   PT DURATION: other: 5 weeks   PLANNED INTERVENTIONS: Therapeutic exercises, Therapeutic activity, Neuromuscular re-education, Patient/Family education, Self Care, Joint mobilization, DME instructions, Aquatic Therapy, Dry Needling, Spinal mobilization, Cryotherapy, Moist heat, scar mobilization, Taping, Biofeedback, and Manual therapy   PLAN FOR NEXT SESSION: continue KT taping for abdominal support during pregnancy as long as skin allows, TA activations with transfers and rolling for decreased strain/coning at abdomen, diaphragmatic breathing training for rib mobility and core activations, glute and opp lat activations for strengthening posterior sling, spine and hip stretching, hip IR mobility and strengthening.   Stacy Gardner, PT, DPT 12/19/234:14 PM

## 2022-04-20 ENCOUNTER — Encounter: Payer: Self-pay | Admitting: Family Medicine

## 2022-04-21 ENCOUNTER — Encounter: Payer: Self-pay | Admitting: Family Medicine

## 2022-04-21 ENCOUNTER — Ambulatory Visit: Payer: No Typology Code available for payment source | Admitting: Physical Therapy

## 2022-04-21 DIAGNOSIS — O26893 Other specified pregnancy related conditions, third trimester: Secondary | ICD-10-CM | POA: Diagnosis not present

## 2022-04-21 DIAGNOSIS — R293 Abnormal posture: Secondary | ICD-10-CM

## 2022-04-21 DIAGNOSIS — M6281 Muscle weakness (generalized): Secondary | ICD-10-CM

## 2022-04-21 DIAGNOSIS — M62838 Other muscle spasm: Secondary | ICD-10-CM

## 2022-04-21 NOTE — Therapy (Signed)
OUTPATIENT PHYSICAL THERAPY TREATMENT NOTE   Patient Name: Cindy Jones MRN: [id number] DOB:[birth year], 31 y.o., female Today's Date: 04/21/2022  PCP: Donnamae Jude, MD REFERRING PROVIDER: Manya Silvas, [place] OF SESSION:   PT End of Session - 04/21/22 0942     Visit Number 6    Date for PT Re-Evaluation 05/16/22    Authorization Type [employer] employee focus    PT Start Time [phone]   late   PT Stop Time 51    PT Time Calculation (min) 33 min    Activity Tolerance Patient tolerated treatment well              Past Medical History:  Diagnosis Date   Anxiety    Asthma    as a child   Binge eating disorder    Dysrhythmia    " I feel it about once a month"   Frequent headaches    GAD (generalized anxiety disorder)    GERD (gastroesophageal reflux disease)    History of acute PID 06/09/2019   Major depression in complete remission ([place])    Migraines    PID (acute pelvic inflammatory disease)    Seasonal allergies    Tourette disorder    Urinary tract bacterial infections    Past Surgical History:  Procedure Laterality Date   CHOLECYSTECTOMY     ESOPHAGOGASTRODUODENOSCOPY     LAPAROSCOPIC CHOLECYSTECTOMY SINGLE SITE WITH INTRAOPERATIVE CHOLANGIOGRAM N/A 03/15/2016   Procedure: LAPAROSCOPIC CHOLECYSTECTOMY SINGLE SITE WITH INTRAOPERATIVE CHOLANGIOGRAM;  Surgeon: Michael Boston, MD;  Location: [facility];  Service: General;  Laterality: N/A;   TONSILLECTOMY AND ADENOIDECTOMY  2013   WISDOM TOOTH EXTRACTION     Patient Active Problem List   Diagnosis Date Noted   Rubella non-immune status, antepartum 12/07/2021   Major depressive disorder, single episode, in full remission ([place]) 11/02/2021   Supervision of other normal pregnancy, antepartum 10/11/2021   History of asthma 09/23/2020   Urticaria 08/24/2020   Other adverse food reactions, not elsewhere classified, subsequent encounter 08/24/2020   Other allergic rhinitis 08/24/2020   Exercise-induced asthma 10/27/2019    GAD (generalized anxiety disorder) 11/07/2018   Overweight (BMI 25.0-29.9) 06/03/2018   Bulimia 04/20/2015   ADD (attention deficit disorder) 01/15/2014   Tourette disease 10/18/2013    REFERRING DIAG: Z3A.33 (ICD-10-CM) - [redacted] weeks gestation of pregnancy O26.893,R10.2 (ICD-10-CM) - Pelvic pressure in pregnancy, antepartum, third trimester  THERAPY DIAG:  Muscle weakness (generalized)  Other muscle spasm  Abnormal posture  Rationale for Evaluation and Treatment Rehabilitation  ONSET DATE: [redacted] weeks pregnant  PERTINENT HISTORY: PERTINENT HISTORY:  low-risk pregnancy and has Tourette disease; ADD (attention deficit disorder); Bulimia; Overweight (BMI 25.0-29.9); GAD (generalized anxiety disorder); Exercise-induced asthma; Urticaria; Sexual abuse: No     BOWEL MOVEMENT: Pain with bowel movement: No Type of bowel movement:Type (Bristol Stool Scale) 4, Frequency daily, and Strain No Fully empty rectum: Yes:   Leakage: No Pads: No Fiber supplement: Yes: miralax and magnesium    URINATION: Pain with urination: No Fully empty bladder: Yes:   Stream: Strong Urgency: Yes:   Frequency: increased with pregnancy, sometimes 2-3x times Leakage:  full bladder with sneezing may have small leakage Pads: No   INTERCOURSE: Pain with intercourse: Initial Penetration and During Penetration Ability to have vaginal penetration:  Yes:   Climax: not painful Marinoff Scale: 1/3   PREGNANCY: Vaginal deliveries 0 Tearing No C-section deliveries 0 Currently pregnant Yes: 33 weeks   PROLAPSE: None  PRECAUTIONS: PRECAUTIONS: Other: pregnant  SUBJECTIVE:                                                                                                                                                                                      SUBJECTIVE STATEMENT:  Sharp pain left groin in the shower this AM.  Feels different from the usual ligament pain.  Currently [redacted] weeks pregnant. Her  husband is with her today.   PAIN:  Are you having pain? Yes NPRS scale: 6/10   Pain type: sharp with mobility, dull with no mobility Pain description: constant    Aggravating factors: single leg tasks, side lying, car transfers, dressing, walking especially on hills Relieving factors: rest   OBJECTIVE: (objective measures completed at initial evaluation unless otherwise dated)   DIAGNOSTIC FINDINGS:    COGNITION: Overall cognitive status: Within functional limits for tasks assessed                          SENSATION: Light touch: Appears intact Proprioception: Appears intact   MUSCLE LENGTH: Bil hamstrings and adductors limited by 25%   SINGLE LEG STANCE: (+) bil for pain and hip drop    POSTURE: rounded shoulders, forward head, and anterior pelvic tilt   PELVIC ALIGNMENT: anterior tilt with limited mobility for posterior tilting    LUMBARAROM/PROM:   A/PROM A/PROM  eval  Flexion Limited by 25%  Extension Limited by 25%  Right lateral flexion Limited by 50%  Left lateral flexion Limited by 50%  Right rotation Limited by 25%  Left rotation Limited by 25%   (Blank rows = not tested)   LOWER EXTREMITY ROM:   WFL   LOWER EXTREMITY MMT:   Bil hip abduction and adduction 3+/5 however limited with pelvic pain, flexion 4/5, ext 4/5 but back pain; knees and ankles 5/5   PALPATION:   General  mild TTP at bil groin and lower abdominal quadrants with fascial restrictions                  External Perineal Exam deferred                              Internal Pelvic Floor deferred    Patient confirms identification and approves PT to assess internal pelvic floor and treatment No   PELVIC MMT:   MMT eval  Vaginal    Internal Anal Sphincter    External Anal Sphincter    Puborectalis    Diastasis Recti    (Blank rows = not tested)         TONE: Deferred  PROLAPSE: deferred   TODAY'S TREATMENT:          DATE:  04/21/22:  Taping at abdominal support, 5  pieces of tape for decreased pain Sitting on ball: ant/post x10, circle x10 each way,  adductor rocks x10 each Discussed benefits of walking Foot in chair weight shifting Sidelying neutral gapping/flexion rotation grade 2/3 right/left 2 min each side                                                                                                                         DATE:  04/18/22:  Taping at abdominal support, 5 pieces of tape for decreased pain Sitting on ball: ant/post x10, circle x10 each way,  adductor rocks x10 each Hip shifts on quad with yoga block x10 each Quad diagonal rocks with yoga block x10 each         PATIENT EDUCATION:  Education details: [email or web address] Person educated: Patient Education method: Consulting civil engineer, Demonstration, Corporate treasurer cues, Verbal cues, and Handouts Education comprehension: verbalized understanding and returned demonstration   HOME EXERCISE PROGRAM: 04/04/22:  Access Code: [email or web address] URL: [email or web address] Date: 04/04/2022 Prepared by: Venetia Night Beuhring  Exercises - Quadruped Rocking Slow  - 1 x daily - 7 x weekly - 1 sets - 5 reps - 5 hold - Cat to Child's Pose with Posterior Pelvic Tilt  - 1 x daily - 7 x weekly - 1 sets - 5 reps - 5 hold - Sit to Stand with Ball Between Knees  - 1 x daily - 7 x weekly - 1 sets - 1 reps - 10 hold - Seated Shoulder Flexion with Dumbbells  - 1 x daily - 7 x weekly - 1 sets - 1 reps - 10 hold  Evaluation: Access Code: [email or web address] URL: [email or web address] Date: 04/04/2022 Prepared by: Baruch Merl  Program Notes Addition: Hip shifting x10 - sitting with feet flat on ground, shift one hip forward. Bringing one knee pushed forward then return it back and then do the other knee forward.   Exercises - Seated Diaphragmatic Breathing  - 1 x daily - 7 x weekly - 1 sets - 10 reps - Quadruped Abdominal and Pelvic Brace  - 1 x daily - 7 x weekly - 1 sets - 10 reps - Tail Wag  - 1 x  daily - 7 x weekly - 1 sets - 10 reps - 3s holds - Quadruped Cat Cow  - 1 x daily - 7 x weekly - 1 sets - 10 reps - 3s holds - Unilateral Supported Supine Butterfly Stretch  - 1 x daily - 7 x weekly - 1 sets - 2 reps - 30s holds - Supine Hip Internal and External Rotation  - 1 x daily - 7 x weekly - 1 sets - 2 reps - 30s holds - Sidelying Open Book Thoracic Rotation with Knee on Foam Roll  - 1 x daily - 7 x weekly - 1 sets - 2 reps - 30s holds - Seated Alternating Side  Stretch with Arm Overhead  - 1 x daily - 7 x weekly - 1 sets - 2 reps - 30s holds - Pelvic Tilt on Swiss Ball  - 1 x daily - 7 x weekly - 1 sets - 10 reps   ASSESSMENT:   CLINICAL IMPRESSION: Patient reports increased discomfort today low in pelvic region.    Some relief with taping, pelvic mobility and manual techniques but still more painful than usual today.  Therapist monitoring response to all interventions and modifying treatment accordingly.      OBJECTIVE IMPAIRMENTS: decreased coordination, decreased endurance, decreased mobility, decreased strength, increased fascial restrictions, increased muscle spasms, impaired flexibility, improper body mechanics, postural dysfunction, and pain.    ACTIVITY LIMITATIONS: carrying, lifting, bending, sitting, standing, squatting, sleeping, stairs, transfers, bed mobility, dressing, and locomotion level   PARTICIPATION LIMITATIONS: interpersonal relationship, driving, shopping, community activity, and occupation   PERSONAL FACTORS: Fitness, Time since onset of injury/illness/exacerbation, and 1 comorbidity: pregnant  are also affecting patient's functional outcome.    REHAB POTENTIAL: Good   CLINICAL DECISION MAKING: Stable/uncomplicated   EVALUATION COMPLEXITY: Low     GOALS: Goals reviewed with patient? Yes   SHORT TERM GOALS: Target date: 04/29/23   Pt to be I with HEP.  Baseline: Goal status: MET   2.  Pt will report no more than 6/10 pelvic pain due to improvements  in posture, strength, and muscle length  Baseline:  Goal status: MET   3.  Pt to demonstrate I  body mechanics with bed mobility, transfers, standing and sitting posture to decrease pain at back and pelvis during pregnancy without cues.  Baseline:  Goal status: ongoing   LONG TERM GOALS: Target date: 05/16/22   Pt to be I with advanced HEP.  Baseline:  Goal status: INITIAL   2.  Pt will report no more than 4/10 pelvic pain due to improvements in posture, strength, and muscle length  Baseline:  Goal status: INITIAL   3.  Pt to be I with coordination of breathing mechanics and core activation for improved pelvic stability with standing and single leg tasks to decrease pain at back and pelvis.  Baseline:  Goal status: ongoing   4.  Pt to demonstrate at least 5/5 bil hip strength for improved pelvic stability and functional squats without leakage.  Baseline:  Goal status: INITIAL     PLAN:   PT FREQUENCY: 1-2x/week   PT DURATION: other: 5 weeks   PLANNED INTERVENTIONS: Therapeutic exercises, Therapeutic activity, Neuromuscular re-education, Patient/Family education, Self Care, Joint mobilization, DME instructions, Aquatic Therapy, Dry Needling, Spinal mobilization, Cryotherapy, Moist heat, scar mobilization, Taping, Biofeedback, and Manual therapy   PLAN FOR NEXT SESSION: continue KT taping for abdominal support during pregnancy as long as skin allows, TA activations with transfers and rolling for decreased strain/coning at abdomen, diaphragmatic breathing training for rib mobility and core activations, glute and opp lat activations for strengthening posterior sling, spine and hip stretching, hip IR mobility and strengthening.   Ruben Im, PT 04/21/22 12:13 PM Phone: [phone] Fax: [phone]

## 2022-04-21 NOTE — BH Specialist Note (Signed)
Integrated Behavioral Health via Telemedicine Visit  04/21/2022 [name] [id number]  Number of Integrated Behavioral Health Clinician visits: 3- Third Visit  Session Start time: 1317   Session End time: 1421  Total time in minutes: 64   Referring Provider: Merian Capron, MD Patient/Family location: [facility] Provider location: [facility]  All persons participating in visit: Patient Cindy Jones and husband, Cindy Jones and Cindy Jones   Types of Service: Individual psychotherapy and Video visit  I connected with Cindy Jones and/or Cindy Jones's  husband  via  Telephone or Video Enabled Telemedicine Application  (Video is Caregility application) and verified that I am speaking with the correct person using two identifiers. Discussed confidentiality: Yes   I discussed the limitations of telemedicine and the availability of in person appointments.  Discussed there is a possibility of technology failure and discussed alternative modes of communication if that failure occurs.  I discussed that engaging in this telemedicine visit, they consent to the provision of behavioral healthcare and the services will be billed under their insurance.  Patient and/or legal guardian expressed understanding and consented to Telemedicine visit: Yes   Presenting Concerns: Patient and/or family reports the following symptoms/concerns: Concern about personal wishes being met in labor and uncertainty regarding whether or not she'll want an epidural or specific pain medications.  Duration of problem: Current pregnancy; Severity of problem: moderate  Patient and/or Family's Strengths/Protective Factors: Social connections, Concrete supports in place (healthy food, safe environments, etc.), Sense of purpose, and Physical Health (exercise, healthy diet, medication compliance, etc.)  Goals Addressed: Patient will:  Reduce symptoms  of: anxiety and depression    Demonstrate ability to: Increase healthy adjustment to current life circumstances  Progress towards Goals: Ongoing  Interventions: Interventions utilized:  Manufacturing systems engineer and Supportive Reflection Standardized Assessments completed: Not Needed  Patient and/or Family Response: Patient agrees with treatment plan.   Assessment: Patient currently experiencing Generalized anxiety disorder, ADHD; History of MDD.   Patient may benefit from continued therapeutic interventions.  Plan: Follow up with behavioral health clinician on : Two weeks postpartum Behavioral recommendations:  -Continue taking prenatal vitamin daily -Pick a code word with husband to use in labor; decide exactly what this word will mean for greater clarification -Continue discussion with husband about expectations in labor. Write down ideal chosen method for managing pain, followed by second and third choices.  -Consider additional conversation with medical provider at upcoming appointment with any additional questions that come to mind.   Referral(s): Integrated [employer] (In Clinic)  I discussed the assessment and treatment plan with the patient and/or parent/guardian. They were provided an opportunity to ask questions and all were answered. They agreed with the plan and demonstrated an understanding of the instructions.   They were advised to call back or seek an in-person evaluation if the symptoms worsen or if the condition fails to improve as anticipated.  Valetta Close Leiam Hopwood, LCSW

## 2022-04-25 ENCOUNTER — Ambulatory Visit: Payer: No Typology Code available for payment source | Admitting: Physical Therapy

## 2022-04-25 DIAGNOSIS — R279 Unspecified lack of coordination: Secondary | ICD-10-CM

## 2022-04-25 DIAGNOSIS — M6281 Muscle weakness (generalized): Secondary | ICD-10-CM

## 2022-04-25 DIAGNOSIS — O26893 Other specified pregnancy related conditions, third trimester: Secondary | ICD-10-CM | POA: Diagnosis not present

## 2022-04-25 DIAGNOSIS — R293 Abnormal posture: Secondary | ICD-10-CM

## 2022-04-25 DIAGNOSIS — M62838 Other muscle spasm: Secondary | ICD-10-CM

## 2022-04-25 NOTE — Therapy (Signed)
OUTPATIENT PHYSICAL THERAPY TREATMENT NOTE   Patient Name: Cindy Jones MRN: [id number] DOB:Dec 05, 1990, 31 y.o., female Today's Date: 04/25/2022  PCP: Donnamae Jude, MD REFERRING PROVIDER: Manya Silvas, [place] OF SESSION:   PT End of Session - 04/25/22 1616     Visit Number 7    Date for PT Re-Evaluation 05/16/22    Authorization Type [employer] employee focus    PT Start Time 55    PT Stop Time 66    PT Time Calculation (min) 48 min    Activity Tolerance Patient tolerated treatment well    Behavior During Therapy WFL for tasks assessed/performed              Past Medical History:  Diagnosis Date   Anxiety    Asthma    as a child   Binge eating disorder    Dysrhythmia    " I feel it about once a month"   Frequent headaches    GAD (generalized anxiety disorder)    GERD (gastroesophageal reflux disease)    History of acute PID 06/09/2019   Major depression in complete remission ([place])    Migraines    PID (acute pelvic inflammatory disease)    Seasonal allergies    Tourette disorder    Urinary tract bacterial infections    Past Surgical History:  Procedure Laterality Date   CHOLECYSTECTOMY     ESOPHAGOGASTRODUODENOSCOPY     LAPAROSCOPIC CHOLECYSTECTOMY SINGLE SITE WITH INTRAOPERATIVE CHOLANGIOGRAM N/A 03/15/2016   Procedure: LAPAROSCOPIC CHOLECYSTECTOMY SINGLE SITE WITH INTRAOPERATIVE CHOLANGIOGRAM;  Surgeon: Michael Boston, MD;  Location: [facility];  Service: General;  Laterality: N/A;   TONSILLECTOMY AND ADENOIDECTOMY  2013   WISDOM TOOTH EXTRACTION     Patient Active Problem List   Diagnosis Date Noted   Rubella non-immune status, antepartum 12/07/2021   Major depressive disorder, single episode, in full remission ([place]) 11/02/2021   Supervision of other normal pregnancy, antepartum 10/11/2021   History of asthma 09/23/2020   Urticaria 08/24/2020   Other adverse food reactions, not elsewhere classified, subsequent encounter 08/24/2020   Other allergic  rhinitis 08/24/2020   Exercise-induced asthma 10/27/2019   GAD (generalized anxiety disorder) 11/07/2018   Overweight (BMI 25.0-29.9) 06/03/2018   Bulimia 04/20/2015   ADD (attention deficit disorder) 01/15/2014   Tourette disease 10/18/2013    REFERRING DIAG: Z3A.33 (ICD-10-CM) - [redacted] weeks gestation of pregnancy O26.893,R10.2 (ICD-10-CM) - Pelvic pressure in pregnancy, antepartum, third trimester  THERAPY DIAG:  Muscle weakness (generalized)  Abnormal posture  Other muscle spasm  Unspecified lack of coordination  Rationale for Evaluation and Treatment Rehabilitation  ONSET DATE: [redacted] weeks pregnant  PERTINENT HISTORY: PERTINENT HISTORY:  low-risk pregnancy and has Tourette disease; ADD (attention deficit disorder); Bulimia; Overweight (BMI 25.0-29.9); GAD (generalized anxiety disorder); Exercise-induced asthma; Urticaria; Sexual abuse: No     BOWEL MOVEMENT: Pain with bowel movement: No Type of bowel movement:Type (Bristol Stool Scale) 4, Frequency daily, and Strain No Fully empty rectum: Yes:   Leakage: No Pads: No Fiber supplement: Yes: miralax and magnesium    URINATION: Pain with urination: No Fully empty bladder: Yes:   Stream: Strong Urgency: Yes:   Frequency: increased with pregnancy, sometimes 2-3x times Leakage:  full bladder with sneezing may have small leakage Pads: No   INTERCOURSE: Pain with intercourse: Initial Penetration and During Penetration Ability to have vaginal penetration:  Yes:   Climax: not painful Marinoff Scale: 1/3   PREGNANCY: Vaginal deliveries 0 Tearing No C-section deliveries 0 Currently pregnant  Yes: 33 weeks   PROLAPSE: None  PRECAUTIONS: PRECAUTIONS: Other: pregnant  SUBJECTIVE:                                                                                                                                                                                      SUBJECTIVE STATEMENT:  Pt reports she was able to do  elliptical today for one mile and this felt really well and was a little sore after (now) at Rt lower abdomen.  Taping keeps helping.   PAIN:  Are you having pain? Yes NPRS scale: 3/10   Pain type: sharp with mobility, dull with no mobility Pain description: constant    Aggravating factors: single leg tasks, side lying, car transfers, dressing, walking especially on hills Relieving factors: rest   OBJECTIVE: (objective measures completed at initial evaluation unless otherwise dated)   DIAGNOSTIC FINDINGS:    COGNITION: Overall cognitive status: Within functional limits for tasks assessed                          SENSATION: Light touch: Appears intact Proprioception: Appears intact   MUSCLE LENGTH: Bil hamstrings and adductors limited by 25%   SINGLE LEG STANCE: (+) bil for pain and hip drop    POSTURE: rounded shoulders, forward head, and anterior pelvic tilt   PELVIC ALIGNMENT: anterior tilt with limited mobility for posterior tilting    LUMBARAROM/PROM:   A/PROM A/PROM  eval  Flexion Limited by 25%  Extension Limited by 25%  Right lateral flexion Limited by 50%  Left lateral flexion Limited by 50%  Right rotation Limited by 25%  Left rotation Limited by 25%   (Blank rows = not tested)   LOWER EXTREMITY ROM:   WFL   LOWER EXTREMITY MMT:   Bil hip abduction and adduction 3+/5 however limited with pelvic pain, flexion 4/5, ext 4/5 but back pain; knees and ankles 5/5   PALPATION:   General  mild TTP at bil groin and lower abdominal quadrants with fascial restrictions                  External Perineal Exam deferred                              Internal Pelvic Floor deferred    Patient confirms identification and approves PT to assess internal pelvic floor and treatment No   PELVIC MMT:   MMT eval  Vaginal    Internal Anal Sphincter    External Anal Sphincter    Puborectalis    Diastasis Recti    (  Blank rows = not tested)         TONE: Deferred     PROLAPSE: deferred   TODAY'S TREATMENT:          DATE:   04/25/22:   Taping at abdominal support, 5 pieces of tape for decreased pain Quad rocking ant/post with alt hip ER and IR x10 each Ardine Eng pose 3x30s Yoga block on Rt knee x10 hip shifts Labor positioning handouts given and reviewed, pt completed x3 reps of positions in all phases for opening pelvic inlet, mid pelvis, and pelvic outlet. Pt denied additional questions.      PATIENT EDUCATION:  Education details: [email or web address] Person educated: Patient Education method: Explanation, Demonstration, Tactile cues, Verbal cues, and Handouts Education comprehension: verbalized understanding and returned demonstration   HOME EXERCISE PROGRAM: 04/04/22:  Access Code: [email or web address] URL: [email or web address] Date: 04/04/2022 Prepared by: Venetia Night Beuhring  Exercises - Quadruped Rocking Slow  - 1 x daily - 7 x weekly - 1 sets - 5 reps - 5 hold - Cat to Child's Pose with Posterior Pelvic Tilt  - 1 x daily - 7 x weekly - 1 sets - 5 reps - 5 hold - Sit to Stand with Ball Between Knees  - 1 x daily - 7 x weekly - 1 sets - 1 reps - 10 hold - Seated Shoulder Flexion with Dumbbells  - 1 x daily - 7 x weekly - 1 sets - 1 reps - 10 hold  Evaluation: Access Code: [email or web address] URL: [email or web address] Date: 04/04/2022 Prepared by: Baruch Merl  Program Notes Addition: Hip shifting x10 - sitting with feet flat on ground, shift one hip forward. Bringing one knee pushed forward then return it back and then do the other knee forward.   Exercises - Seated Diaphragmatic Breathing  - 1 x daily - 7 x weekly - 1 sets - 10 reps - Quadruped Abdominal and Pelvic Brace  - 1 x daily - 7 x weekly - 1 sets - 10 reps - Tail Wag  - 1 x daily - 7 x weekly - 1 sets - 10 reps - 3s holds - Quadruped Cat Cow  - 1 x daily - 7 x weekly - 1 sets - 10 reps - 3s holds - Unilateral Supported Supine Butterfly Stretch  - 1 x daily - 7 x weekly  - 1 sets - 2 reps - 30s holds - Supine Hip Internal and External Rotation  - 1 x daily - 7 x weekly - 1 sets - 2 reps - 30s holds - Sidelying Open Book Thoracic Rotation with Knee on Foam Roll  - 1 x daily - 7 x weekly - 1 sets - 2 reps - 30s holds - Seated Alternating Side Stretch with Arm Overhead  - 1 x daily - 7 x weekly - 1 sets - 2 reps - 30s holds - Pelvic Tilt on Swiss Ball  - 1 x daily - 7 x weekly - 1 sets - 10 reps   ASSESSMENT:   CLINICAL IMPRESSION: Patient reports  she has noted improvement this last week and pleased with progress, able to tolerate more activity without worsening pain now. Session focused on hip mobility, pelvic relaxation and education on labor positions. Pt educated that these positions are meant to educate pt on comfort positions during labor. They are not a substitute for medical recommendations during labor and all recommendations from providers should be followed as needed for pt and baby safety during labor. Pt agreed. Therapist monitoring response to  all interventions and modifying treatment accordingly.      OBJECTIVE IMPAIRMENTS: decreased coordination, decreased endurance, decreased mobility, decreased strength, increased fascial restrictions, increased muscle spasms, impaired flexibility, improper body mechanics, postural dysfunction, and pain.    ACTIVITY LIMITATIONS: carrying, lifting, bending, sitting, standing, squatting, sleeping, stairs, transfers, bed mobility, dressing, and locomotion level   PARTICIPATION LIMITATIONS: interpersonal relationship, driving, shopping, community activity, and occupation   PERSONAL FACTORS: Fitness, Time since onset of injury/illness/exacerbation, and 1 comorbidity: pregnant  are also affecting patient's functional outcome.    REHAB POTENTIAL: Good   CLINICAL DECISION MAKING: Stable/uncomplicated   EVALUATION COMPLEXITY: Low     GOALS: Goals reviewed with patient? Yes   SHORT TERM GOALS: Target date:  04/29/23   Pt to be I with HEP.  Baseline: Goal status: MET   2.  Pt will report no more than 6/10 pelvic pain due to improvements in posture, strength, and muscle length  Baseline:  Goal status: MET   3.  Pt to demonstrate I  body mechanics with bed mobility, transfers, standing and sitting posture to decrease pain at back and pelvis during pregnancy without cues.  Baseline:  Goal status: ongoing   LONG TERM GOALS: Target date: 05/16/22   Pt to be I with advanced HEP.  Baseline:  Goal status: INITIAL   2.  Pt will report no more than 4/10 pelvic pain due to improvements in posture, strength, and muscle length  Baseline:  Goal status: INITIAL   3.  Pt to be I with coordination of breathing mechanics and core activation for improved pelvic stability with standing and single leg tasks to decrease pain at back and pelvis.  Baseline:  Goal status: ongoing   4.  Pt to demonstrate at least 5/5 bil hip strength for improved pelvic stability and functional squats without leakage.  Baseline:  Goal status: INITIAL     PLAN:   PT FREQUENCY: 1-2x/week   PT DURATION: other: 5 weeks   PLANNED INTERVENTIONS: Therapeutic exercises, Therapeutic activity, Neuromuscular re-education, Patient/Family education, Self Care, Joint mobilization, DME instructions, Aquatic Therapy, Dry Needling, Spinal mobilization, Cryotherapy, Moist heat, scar mobilization, Taping, Biofeedback, and Manual therapy   PLAN FOR NEXT SESSION: continue KT taping for abdominal support during pregnancy as long as skin allows, TA activations with transfers and rolling for decreased strain/coning at abdomen, diaphragmatic breathing training for rib mobility and core activations, glute and opp lat activations for strengthening posterior sling, spine and hip stretching, hip IR mobility and strengthening.   Stacy Gardner, PT, DPT 12/26/235:11 PM

## 2022-04-26 ENCOUNTER — Other Ambulatory Visit: Payer: Self-pay

## 2022-04-26 ENCOUNTER — Ambulatory Visit (INDEPENDENT_AMBULATORY_CARE_PROVIDER_SITE_OTHER): Payer: No Typology Code available for payment source | Admitting: Certified Nurse Midwife

## 2022-04-26 ENCOUNTER — Other Ambulatory Visit (HOSPITAL_COMMUNITY)
Admission: RE | Admit: 2022-04-26 | Discharge: 2022-04-26 | Disposition: A | Discharge status: Home / Self Care | Payer: No Typology Code available for payment source | Source: Ambulatory Visit | Attending: Certified Nurse Midwife | Admitting: Certified Nurse Midwife

## 2022-04-26 VITALS — BP 111/77 | HR 74 | Wt 240.0 lb

## 2022-04-26 DIAGNOSIS — Z3493 Encounter for supervision of normal pregnancy, unspecified, third trimester: Secondary | ICD-10-CM

## 2022-04-26 DIAGNOSIS — Z3A37 37 weeks gestation of pregnancy: Secondary | ICD-10-CM

## 2022-04-26 DIAGNOSIS — Z2839 Other underimmunization status: Secondary | ICD-10-CM

## 2022-04-26 DIAGNOSIS — O09899 Supervision of other high risk pregnancies, unspecified trimester: Secondary | ICD-10-CM

## 2022-04-26 NOTE — Progress Notes (Signed)
   PRENATAL VISIT NOTE  Subjective:  Cindy Jones is a 31 y.o. G2P0010 at 20w1dbeing seen today for ongoing prenatal care.  She is currently monitored for the following issues for this low-risk pregnancy and has Tourette disease; ADD (attention deficit disorder); Bulimia; Overweight (BMI 25.0-29.9); GAD (generalized anxiety disorder); Exercise-induced asthma; Urticaria; Other adverse food reactions, not elsewhere classified, subsequent encounter; Other allergic rhinitis; History of asthma; Supervision of other normal pregnancy, antepartum; Major depressive disorder, single episode, in full remission ([place]; and Rubella non-immune status, antepartum on their problem list.  Patient reports no complaints.  Contractions: Irritability. Vag. Bleeding: None.  Movement: Present. Denies leaking of fluid.   The following portions of the patient's history were reviewed and updated as appropriate: allergies, current medications, past family history, past medical history, past social history, past surgical history and problem list.   Objective:   Vitals:   04/26/22 1130  BP: 111/77  Pulse: 74  Weight: 240 lb (108.9 kg)    Fetal Status: Fetal Heart Rate (bpm): 138 Fundal Height: 37 cm Movement: Present  Presentation: Vertex  General:  Alert, oriented and cooperative. Patient is in no acute distress.  Skin: Skin is warm and dry. No rash noted.   Cardiovascular: Normal heart rate noted  Respiratory: Normal respiratory effort, no problems with respiration noted  Abdomen: Soft, gravid, appropriate for gestational age.  Pain/Pressure: Present     Pelvic: Cervical exam performed in the presence of a chaperone Dilation: 1 Effacement (%): 80 Station: -2  Extremities: Normal range of motion.  Edema: Trace  Mental Status: Normal mood and affect. Normal behavior. Normal judgment and thought content.   Assessment and Plan:  Pregnancy: G2P0010 at 381w1d. Encounter for supervision of low-risk pregnancy in  third trimester - Doing well, feeling regular and vigorous fetal movement  - Pelvic pain is much decreased, able to complete elliptical workouts now - Discussed timing of IOL, reassurance given, pt currently desires to wait until the 40th week as long as she continues to feel good with her pelvic pain - Encouraged her to continue practicing labor movements (as described by her PT), using her K-tape, etc to maintain her current comfort - Reviewed signs/symptoms of labor with she and FOB  2. [redacted] weeks gestation of pregnancy - Routine OB care  - GC/Chlamydia probe amp [facility], beta strep (group b only)  3. Rubella non-immune status, antepartum - Will get MMR prior to postpartum discharge  Term labor symptoms and general obstetric precautions including but not limited to vaginal bleeding, contractions, leaking of fluid and fetal movement were reviewed in detail with the patient. Please refer to After Visit Summary for other counseling recommendations.   Return in about 1 week (around 05/03/2022) for IN-PERSON, MBROB.  Future Appointments  Date Time Provider DeGlen Lyon12/28/2023 10:15 AM MoJunie PanningPT [facility] None  05/02/2022 12:30 PM MoJunie PanningPT [facility] None  05/04/2022  9:55 AM EcClarnce FlockMD [facility]/07/2022 11:45 AM MoJunie PanningPT [facility] None  05/05/2022  8:15 AM [facility]1/03/2023  9:55 AM EcClarnce FlockMD [facility]1/18/2024  9:15 AM [facility] NST [facility]1/19/2024 10:15 AM EcClarnce FlockMD [facility]1/25/2024 11:30 AM AgCharlcie CradleMD [facility] None  05/30/2022  8:20 AM ToBerniece SalinesDO [facility] None    JaGabriel CarinaCNM

## 2022-04-27 ENCOUNTER — Ambulatory Visit: Payer: No Typology Code available for payment source | Admitting: Physical Therapy

## 2022-04-27 DIAGNOSIS — M6281 Muscle weakness (generalized): Secondary | ICD-10-CM

## 2022-04-27 DIAGNOSIS — M62838 Other muscle spasm: Secondary | ICD-10-CM

## 2022-04-27 DIAGNOSIS — R279 Unspecified lack of coordination: Secondary | ICD-10-CM

## 2022-04-27 DIAGNOSIS — R293 Abnormal posture: Secondary | ICD-10-CM

## 2022-04-27 DIAGNOSIS — O26893 Other specified pregnancy related conditions, third trimester: Secondary | ICD-10-CM | POA: Diagnosis not present

## 2022-04-27 LAB — GC/CHLAMYDIA PROBE AMP (~~LOC~~) NOT AT ARMC
Chlamydia: NEGATIVE
Comment: NEGATIVE
Comment: NORMAL
Neisseria Gonorrhea: NEGATIVE

## 2022-04-27 NOTE — Therapy (Signed)
OUTPATIENT PHYSICAL THERAPY TREATMENT NOTE   Patient Name: Cindy Jones MRN: [id number] DOB:[birth year], 31 y.o., female Today's Date: 04/27/2022  PCP: Donnamae Jude, MD REFERRING PROVIDER: Manya Silvas, CNM  END OF SESSION:   PT End of Session - 04/27/22 1024     Visit Number 8    Date for PT Re-Evaluation 05/16/22    Authorization Type [employer] employee focus    PT Start Time 87    PT Stop Time 1100    PT Time Calculation (min) 38 min    Activity Tolerance Patient tolerated treatment well    Behavior During Therapy WFL for tasks assessed/performed              Past Medical History:  Diagnosis Date   Anxiety    Asthma    as a child   Binge eating disorder    Dysrhythmia    " I feel it about once a month"   Frequent headaches    GAD (generalized anxiety disorder)    GERD (gastroesophageal reflux disease)    History of acute PID 06/09/2019   Major depression in complete remission ([place])    Migraines    PID (acute pelvic inflammatory disease)    Seasonal allergies    Tourette disorder    Urinary tract bacterial infections    Past Surgical History:  Procedure Laterality Date   CHOLECYSTECTOMY     ESOPHAGOGASTRODUODENOSCOPY     LAPAROSCOPIC CHOLECYSTECTOMY SINGLE SITE WITH INTRAOPERATIVE CHOLANGIOGRAM N/A 03/15/2016   Procedure: LAPAROSCOPIC CHOLECYSTECTOMY SINGLE SITE WITH INTRAOPERATIVE CHOLANGIOGRAM;  Surgeon: Michael Boston, MD;  Location: [facility];  Service: General;  Laterality: N/A;   TONSILLECTOMY AND ADENOIDECTOMY  2013   WISDOM TOOTH EXTRACTION     Patient Active Problem List   Diagnosis Date Noted   Rubella non-immune status, antepartum 12/07/2021   Major depressive disorder, single episode, in full remission ([place]) 11/02/2021   Supervision of other normal pregnancy, antepartum 10/11/2021   History of asthma 09/23/2020   Urticaria 08/24/2020   Other adverse food reactions, not elsewhere classified, subsequent encounter 08/24/2020   Other allergic  rhinitis 08/24/2020   Exercise-induced asthma 10/27/2019   GAD (generalized anxiety disorder) 11/07/2018   Overweight (BMI 25.0-29.9) 06/03/2018   Bulimia 04/20/2015   ADD (attention deficit disorder) 01/15/2014   Tourette disease 10/18/2013    REFERRING DIAG: Z3A.33 (ICD-10-CM) - [redacted] weeks gestation of pregnancy O26.893,R10.2 (ICD-10-CM) - Pelvic pressure in pregnancy, antepartum, third trimester  THERAPY DIAG:  Muscle weakness (generalized)  Abnormal posture  Unspecified lack of coordination  Other muscle spasm  Rationale for Evaluation and Treatment Rehabilitation  ONSET DATE: [redacted] weeks pregnant  PERTINENT HISTORY: PERTINENT HISTORY:  low-risk pregnancy and has Tourette disease; ADD (attention deficit disorder); Bulimia; Overweight (BMI 25.0-29.9); GAD (generalized anxiety disorder); Exercise-induced asthma; Urticaria; Sexual abuse: No    BOWEL MOVEMENT: Pain with bowel movement: No Type of bowel movement:Type (Bristol Stool Scale) 4, Frequency daily, and Strain No Fully empty rectum: Yes:   Leakage: No Pads: No Fiber supplement: Yes: miralax and magnesium    URINATION: Pain with urination: No Fully empty bladder: Yes:   Stream: Strong Urgency: Yes:   Frequency: increased with pregnancy, sometimes 2-3x times Leakage:  full bladder with sneezing may have small leakage Pads: No   INTERCOURSE: Pain with intercourse: Initial Penetration and During Penetration Ability to have vaginal penetration:  Yes:   Climax: not painful Marinoff Scale: 1/3   PREGNANCY: Vaginal deliveries 0 Tearing No C-section deliveries 0 Currently pregnant Yes:  33 weeks   PROLAPSE: None  PRECAUTIONS: PRECAUTIONS: Other: pregnant  SUBJECTIVE:                                                                                                                                                                                      SUBJECTIVE STATEMENT:  Pt reports pain has been better, able  to work out more and tolerating much more activity without increased pain. Tape still in place.   PAIN:  Are you having pain? no    OBJECTIVE: (objective measures completed at initial evaluation unless otherwise dated)   DIAGNOSTIC FINDINGS:    COGNITION: Overall cognitive status: Within functional limits for tasks assessed                          SENSATION: Light touch: Appears intact Proprioception: Appears intact   MUSCLE LENGTH: Bil hamstrings and adductors limited by 25%   SINGLE LEG STANCE: (+) bil for pain and hip drop    POSTURE: rounded shoulders, forward head, and anterior pelvic tilt   PELVIC ALIGNMENT: anterior tilt with limited mobility for posterior tilting    LUMBARAROM/PROM:   A/PROM A/PROM  eval  Flexion Limited by 25%  Extension Limited by 25%  Right lateral flexion Limited by 50%  Left lateral flexion Limited by 50%  Right rotation Limited by 25%  Left rotation Limited by 25%   (Blank rows = not tested)   LOWER EXTREMITY ROM:   WFL   LOWER EXTREMITY MMT:   Bil hip abduction and adduction 3+/5 however limited with pelvic pain, flexion 4/5, ext 4/5 but back pain; knees and ankles 5/5   PALPATION:   General  mild TTP at bil groin and lower abdominal quadrants with fascial restrictions                  External Perineal Exam deferred                              Internal Pelvic Floor deferred    Patient confirms identification and approves PT to assess internal pelvic floor and treatment No   PELVIC MMT:   MMT eval  Vaginal    Internal Anal Sphincter    External Anal Sphincter    Puborectalis    Diastasis Recti    (Blank rows = not tested)         TONE: Deferred    PROLAPSE: deferred   TODAY'S TREATMENT:          DATE:   04/27/22: Quad rocking ant/post with alt hip ER and IR  x10 each Ardine Eng pose 3x30s Quad thoracic open 2x30 each Quad hip shifts on block rt side 2x30 and x10 shifts Squats 2x10 5# dumb bells each Dead  lifts 5# dumb bells each 2x10 Rotations staggered stance trunk rotation 5# x10 each Elevated lunge with trunk rotation x10 5s each   04/25/22:   Taping at abdominal support, 5 pieces of tape for decreased pain Quad rocking ant/post with alt hip ER and IR x10 each Ardine Eng pose 3x30s Yoga block on Rt knee x10 hip shifts Labor positioning handouts given and reviewed, pt completed x3 reps of positions in all phases for opening pelvic inlet, mid pelvis, and pelvic outlet. Pt denied additional questions.      PATIENT EDUCATION:  Education details: [email or web address] Person educated: Patient Education method: Explanation, Demonstration, Tactile cues, Verbal cues, and Handouts Education comprehension: verbalized understanding and returned demonstration   HOME EXERCISE PROGRAM: 04/04/22:  Access Code: [email or web address] URL: [email or web address] Date: 04/04/2022 Prepared by: Venetia Night Beuhring  Exercises - Quadruped Rocking Slow  - 1 x daily - 7 x weekly - 1 sets - 5 reps - 5 hold - Cat to Child's Pose with Posterior Pelvic Tilt  - 1 x daily - 7 x weekly - 1 sets - 5 reps - 5 hold - Sit to Stand with Ball Between Knees  - 1 x daily - 7 x weekly - 1 sets - 1 reps - 10 hold - Seated Shoulder Flexion with Dumbbells  - 1 x daily - 7 x weekly - 1 sets - 1 reps - 10 hold  Evaluation: Access Code: [email or web address] URL: [email or web address] Date: 04/04/2022 Prepared by: Baruch Merl  Program Notes Addition: Hip shifting x10 - sitting with feet flat on ground, shift one hip forward. Bringing one knee pushed forward then return it back and then do the other knee forward.   Exercises - Seated Diaphragmatic Breathing  - 1 x daily - 7 x weekly - 1 sets - 10 reps - Quadruped Abdominal and Pelvic Brace  - 1 x daily - 7 x weekly - 1 sets - 10 reps - Tail Wag  - 1 x daily - 7 x weekly - 1 sets - 10 reps - 3s holds - Quadruped Cat Cow  - 1 x daily - 7 x weekly - 1 sets - 10 reps - 3s holds -  Unilateral Supported Supine Butterfly Stretch  - 1 x daily - 7 x weekly - 1 sets - 2 reps - 30s holds - Supine Hip Internal and External Rotation  - 1 x daily - 7 x weekly - 1 sets - 2 reps - 30s holds - Sidelying Open Book Thoracic Rotation with Knee on Foam Roll  - 1 x daily - 7 x weekly - 1 sets - 2 reps - 30s holds - Seated Alternating Side Stretch with Arm Overhead  - 1 x daily - 7 x weekly - 1 sets - 2 reps - 30s holds - Pelvic Tilt on Swiss Ball  - 1 x daily - 7 x weekly - 1 sets - 10 reps   ASSESSMENT:   CLINICAL IMPRESSION: Patient reports she is feeling better and tolerating more activity without pain. Session focused on mobility at pelvis with slight increased resistance today, no pain reported by pt, rest breaks taken as needed, pt able to talk through all exercises and reported no contractions or pain. Pt tolerated well. Therapist monitoring response to all interventions and modifying treatment accordingly.      OBJECTIVE IMPAIRMENTS: decreased  coordination, decreased endurance, decreased mobility, decreased strength, increased fascial restrictions, increased muscle spasms, impaired flexibility, improper body mechanics, postural dysfunction, and pain.    ACTIVITY LIMITATIONS: carrying, lifting, bending, sitting, standing, squatting, sleeping, stairs, transfers, bed mobility, dressing, and locomotion level   PARTICIPATION LIMITATIONS: interpersonal relationship, driving, shopping, community activity, and occupation   PERSONAL FACTORS: Fitness, Time since onset of injury/illness/exacerbation, and 1 comorbidity: pregnant  are also affecting patient's functional outcome.    REHAB POTENTIAL: Good   CLINICAL DECISION MAKING: Stable/uncomplicated   EVALUATION COMPLEXITY: Low     GOALS: Goals reviewed with patient? Yes   SHORT TERM GOALS: Target date: 04/29/23   Pt to be I with HEP.  Baseline: Goal status: MET   2.  Pt will report no more than 6/10 pelvic pain due to  improvements in posture, strength, and muscle length  Baseline:  Goal status: MET   3.  Pt to demonstrate I  body mechanics with bed mobility, transfers, standing and sitting posture to decrease pain at back and pelvis during pregnancy without cues.  Baseline:  Goal status: ongoing   LONG TERM GOALS: Target date: 05/16/22   Pt to be I with advanced HEP.  Baseline:  Goal status: INITIAL   2.  Pt will report no more than 4/10 pelvic pain due to improvements in posture, strength, and muscle length  Baseline:  Goal status: INITIAL   3.  Pt to be I with coordination of breathing mechanics and core activation for improved pelvic stability with standing and single leg tasks to decrease pain at back and pelvis.  Baseline:  Goal status: ongoing   4.  Pt to demonstrate at least 5/5 bil hip strength for improved pelvic stability and functional squats without leakage.  Baseline:  Goal status: INITIAL     PLAN:   PT FREQUENCY: 1-2x/week   PT DURATION: other: 5 weeks   PLANNED INTERVENTIONS: Therapeutic exercises, Therapeutic activity, Neuromuscular re-education, Patient/Family education, Self Care, Joint mobilization, DME instructions, Aquatic Therapy, Dry Needling, Spinal mobilization, Cryotherapy, Moist heat, scar mobilization, Taping, Biofeedback, and Manual therapy   PLAN FOR NEXT SESSION: continue KT taping for abdominal support during pregnancy as long as skin allows, TA activations with transfers and rolling for decreased strain/coning at abdomen, diaphragmatic breathing training for rib mobility and core activations, glute and opp lat activations for strengthening posterior sling, spine and hip stretching, hip IR mobility and strengthening.   Stacy Gardner, PT, DPT 04/27/2310:01 AM

## 2022-04-30 LAB — CULTURE, BETA STREP (GROUP B ONLY): Strep Gp B Culture: NEGATIVE

## 2022-05-01 NOTE — L&D Delivery Note (Signed)
Delivery Note Cindy Jones is a 32 y.o. G2P0010 at [redacted]w[redacted]d admitted for PROM.   GBS Status: Negative/-- (12/27 1549) Maximum Maternal Temperature: 98.6  Labor course: Initial SVE: 1/80/ballotable. Augmentation with: Pitocin, IP Foley, and AROM of forebag . She then progressed to complete.  ROM: 29h 86m with clear fluid  Birth: Father of baby wanted to help deliver, so he gowned/gloved and I guided his hands with mine on top of his, and at 2123 a viable female was delivered via spontaneous vaginal delivery (Presentation: ROA). Nuchal cord present: No.  Shoulders and body delivered in usual fashion. Infant placed directly on mom's abdomen for bonding/skin-to-skin, baby dried and stimulated. Cord clamped x 2 after 1 minute and cut by FOB.  Cord blood collected.  The placenta separated spontaneously and delivered via gentle cord traction.  Pitocin infused rapidly IV per protocol.  Fundus firm with massage.  Placenta inspected and appears to be intact with a 3 VC.  Placenta/Cord with the following complications: none .  Cord pH: not done Sponge and instrument count were correct x2.  Intrapartum complications:  None Anesthesia:  epidural Episiotomy: none Lacerations:  2nd degree vaginal (no perineum involved) Suture Repair: 3.0 vicryl rapide EBL (mL): 250   Infant: APGAR (1 MIN): 8   APGAR (5 MINS): 9   APGAR (10 MINS):    Infant weight: pending  Mom to postpartum.  Baby to Couplet care / Skin to Skin. Placenta to L&D   Plans to Breastfeed Contraception: condoms Circumcision: declines  Note sent to [facility]:  [facility]  for pp visit.  [place], [facility] 05/16/2022 9:50 PM

## 2022-05-02 ENCOUNTER — Ambulatory Visit: Payer: 59 | Attending: Advanced Practice Midwife | Admitting: Physical Therapy

## 2022-05-02 DIAGNOSIS — M6281 Muscle weakness (generalized): Secondary | ICD-10-CM | POA: Insufficient documentation

## 2022-05-02 DIAGNOSIS — R279 Unspecified lack of coordination: Secondary | ICD-10-CM | POA: Diagnosis not present

## 2022-05-02 DIAGNOSIS — M62838 Other muscle spasm: Secondary | ICD-10-CM | POA: Diagnosis not present

## 2022-05-02 DIAGNOSIS — R293 Abnormal posture: Secondary | ICD-10-CM | POA: Insufficient documentation

## 2022-05-02 NOTE — Therapy (Signed)
OUTPATIENT PHYSICAL THERAPY TREATMENT NOTE   Patient Name: Cindy Jones MRN: [id number] DOB:1990-08-26, 32 y.o., female Today's Date: 05/02/2022  PCP: Donnamae Jude, MD REFERRING PROVIDER: Manya Silvas, [place] OF SESSION:   PT End of Session - 05/02/22 1252     Visit Number 9    Date for PT Re-Evaluation 05/16/22    Authorization Type [employer] employee focus    PT Start Time 43    PT Stop Time 2993    PT Time Calculation (min) 38 min    Activity Tolerance Patient tolerated treatment well    Behavior During Therapy WFL for tasks assessed/performed              Past Medical History:  Diagnosis Date   Anxiety    Asthma    as a child   Binge eating disorder    Dysrhythmia    " I feel it about once a month"   Frequent headaches    GAD (generalized anxiety disorder)    GERD (gastroesophageal reflux disease)    History of acute PID 06/09/2019   Major depression in complete remission ([place])    Migraines    PID (acute pelvic inflammatory disease)    Seasonal allergies    Tourette disorder    Urinary tract bacterial infections    Past Surgical History:  Procedure Laterality Date   CHOLECYSTECTOMY     ESOPHAGOGASTRODUODENOSCOPY     LAPAROSCOPIC CHOLECYSTECTOMY SINGLE SITE WITH INTRAOPERATIVE CHOLANGIOGRAM N/A 03/15/2016   Procedure: LAPAROSCOPIC CHOLECYSTECTOMY SINGLE SITE WITH INTRAOPERATIVE CHOLANGIOGRAM;  Surgeon: Michael Boston, MD;  Location: [facility];  Service: General;  Laterality: N/A;   TONSILLECTOMY AND ADENOIDECTOMY  2013   WISDOM TOOTH EXTRACTION     Patient Active Problem List   Diagnosis Date Noted   Rubella non-immune status, antepartum 12/07/2021   Major depressive disorder, single episode, in full remission ([place]) 11/02/2021   Supervision of other normal pregnancy, antepartum 10/11/2021   History of asthma 09/23/2020   Urticaria 08/24/2020   Other adverse food reactions, not elsewhere classified, subsequent encounter 08/24/2020   Other allergic  rhinitis 08/24/2020   Exercise-induced asthma 10/27/2019   GAD (generalized anxiety disorder) 11/07/2018   Overweight (BMI 25.0-29.9) 06/03/2018   Bulimia 04/20/2015   ADD (attention deficit disorder) 01/15/2014   Tourette disease 10/18/2013    REFERRING DIAG: Z3A.33 (ICD-10-CM) - [redacted] weeks gestation of pregnancy O26.893,R10.2 (ICD-10-CM) - Pelvic pressure in pregnancy, antepartum, third trimester  THERAPY DIAG:  Muscle weakness (generalized)  Abnormal posture  Unspecified lack of coordination  Rationale for Evaluation and Treatment Rehabilitation  ONSET DATE: [redacted] weeks pregnant  PERTINENT HISTORY: PERTINENT HISTORY:  low-risk pregnancy and has Tourette disease; ADD (attention deficit disorder); Bulimia; Overweight (BMI 25.0-29.9); GAD (generalized anxiety disorder); Exercise-induced asthma; Urticaria; Sexual abuse: No    BOWEL MOVEMENT: Pain with bowel movement: No Type of bowel movement:Type (Bristol Stool Scale) 4, Frequency daily, and Strain No Fully empty rectum: Yes:   Leakage: No Pads: No Fiber supplement: Yes: miralax and magnesium    URINATION: Pain with urination: No Fully empty bladder: Yes:   Stream: Strong Urgency: Yes:   Frequency: increased with pregnancy, sometimes 2-3x times Leakage:  full bladder with sneezing may have small leakage Pads: No   INTERCOURSE: Pain with intercourse: Initial Penetration and During Penetration Ability to have vaginal penetration:  Yes:   Climax: not painful Marinoff Scale: 1/3   PREGNANCY: Vaginal deliveries 0 Tearing No C-section deliveries 0 Currently pregnant Yes: 33 weeks  PROLAPSE: None  PRECAUTIONS: PRECAUTIONS: Other: pregnant  SUBJECTIVE:                                                                                                                                                                                      SUBJECTIVE STATEMENT:  Pt reports minimal pelvic pain, states she thinks baby has  moved. Has removed tape and agreeable to tape at start of session.   PAIN:  Are you having pain? no    OBJECTIVE: (objective measures completed at initial evaluation unless otherwise dated)   DIAGNOSTIC FINDINGS:    COGNITION: Overall cognitive status: Within functional limits for tasks assessed                          SENSATION: Light touch: Appears intact Proprioception: Appears intact   MUSCLE LENGTH: Bil hamstrings and adductors limited by 25%   SINGLE LEG STANCE: (+) bil for pain and hip drop    POSTURE: rounded shoulders, forward head, and anterior pelvic tilt   PELVIC ALIGNMENT: anterior tilt with limited mobility for posterior tilting    LUMBARAROM/PROM:   A/PROM A/PROM  eval  Flexion Limited by 25%  Extension Limited by 25%  Right lateral flexion Limited by 50%  Left lateral flexion Limited by 50%  Right rotation Limited by 25%  Left rotation Limited by 25%   (Blank rows = not tested)   LOWER EXTREMITY ROM:   WFL   LOWER EXTREMITY MMT:   Bil hip abduction and adduction 3+/5 however limited with pelvic pain, flexion 4/5, ext 4/5 but back pain; knees and ankles 5/5   PALPATION:   General  mild TTP at bil groin and lower abdominal quadrants with fascial restrictions                  External Perineal Exam deferred                              Internal Pelvic Floor deferred    Patient confirms identification and approves PT to assess internal pelvic floor and treatment No   PELVIC MMT:   MMT eval  Vaginal    Internal Anal Sphincter    External Anal Sphincter    Puborectalis    Diastasis Recti    (Blank rows = not tested)         TONE: Deferred    PROLAPSE: deferred   TODAY'S TREATMENT:          DATE:   05/02/22  Taping - K tape 5 pieces at abdomen for support and decreased pain  at pelvis - good effect as pt reports improved pain levels Seated hip IR with adductors at block 2x10 Quad rocking ant/post with yoga block hip shifting   x10 Ardine Eng pose 3x30s Quad hip shifts on block rt side 2x30 and x10 shifts Seated open books x10 each side     PATIENT EDUCATION:  Education details: [email or web address] Person educated: Patient Education method: Explanation, Demonstration, Tactile cues, Verbal cues, and Handouts Education comprehension: verbalized understanding and returned demonstration   HOME EXERCISE PROGRAM: 04/04/22:  Access Code: [email or web address] URL: [email or web address] Date: 04/04/2022 Prepared by: Venetia Night Beuhring  Exercises - Quadruped Rocking Slow  - 1 x daily - 7 x weekly - 1 sets - 5 reps - 5 hold - Cat to Child's Pose with Posterior Pelvic Tilt  - 1 x daily - 7 x weekly - 1 sets - 5 reps - 5 hold - Sit to Stand with Ball Between Knees  - 1 x daily - 7 x weekly - 1 sets - 1 reps - 10 hold - Seated Shoulder Flexion with Dumbbells  - 1 x daily - 7 x weekly - 1 sets - 1 reps - 10 hold  Evaluation: Access Code: [email or web address] URL: [email or web address] Date: 04/04/2022 Prepared by: Baruch Merl  Program Notes Addition: Hip shifting x10 - sitting with feet flat on ground, shift one hip forward. Bringing one knee pushed forward then return it back and then do the other knee forward.   Exercises - Seated Diaphragmatic Breathing  - 1 x daily - 7 x weekly - 1 sets - 10 reps - Quadruped Abdominal and Pelvic Brace  - 1 x daily - 7 x weekly - 1 sets - 10 reps - Tail Wag  - 1 x daily - 7 x weekly - 1 sets - 10 reps - 3s holds - Quadruped Cat Cow  - 1 x daily - 7 x weekly - 1 sets - 10 reps - 3s holds - Unilateral Supported Supine Butterfly Stretch  - 1 x daily - 7 x weekly - 1 sets - 2 reps - 30s holds - Supine Hip Internal and External Rotation  - 1 x daily - 7 x weekly - 1 sets - 2 reps - 30s holds - Sidelying Open Book Thoracic Rotation with Knee on Foam Roll  - 1 x daily - 7 x weekly - 1 sets - 2 reps - 30s holds - Seated Alternating Side Stretch with Arm Overhead  - 1 x daily - 7 x weekly - 1  sets - 2 reps - 30s holds - Pelvic Tilt on Swiss Ball  - 1 x daily - 7 x weekly - 1 sets - 10 reps   ASSESSMENT:   CLINICAL IMPRESSION: Patient reports she has had less pain, tape still helpful. Pt session focused on pelvic mobility and breathing mechanics for pelvic relaxation. Pt able to talk through all exercises and reported no contractions or pain. Pt tolerated well. Therapist monitoring response to all interventions and modifying treatment accordingly.      OBJECTIVE IMPAIRMENTS: decreased coordination, decreased endurance, decreased mobility, decreased strength, increased fascial restrictions, increased muscle spasms, impaired flexibility, improper body mechanics, postural dysfunction, and pain.    ACTIVITY LIMITATIONS: carrying, lifting, bending, sitting, standing, squatting, sleeping, stairs, transfers, bed mobility, dressing, and locomotion level   PARTICIPATION LIMITATIONS: interpersonal relationship, driving, shopping, community activity, and occupation   PERSONAL FACTORS: Fitness, Time since onset of injury/illness/exacerbation, and 1 comorbidity: pregnant  are also affecting patient's functional outcome.    REHAB  POTENTIAL: Good   CLINICAL DECISION MAKING: Stable/uncomplicated   EVALUATION COMPLEXITY: Low     GOALS: Goals reviewed with patient? Yes   SHORT TERM GOALS: Target date: 04/29/23   Pt to be I with HEP.  Baseline: Goal status: MET   2.  Pt will report no more than 6/10 pelvic pain due to improvements in posture, strength, and muscle length  Baseline:  Goal status: MET   3.  Pt to demonstrate I  body mechanics with bed mobility, transfers, standing and sitting posture to decrease pain at back and pelvis during pregnancy without cues.  Baseline:  Goal status: ongoing   LONG TERM GOALS: Target date: 05/16/22   Pt to be I with advanced HEP.  Baseline:  Goal status: INITIAL   2.  Pt will report no more than 4/10 pelvic pain due to improvements in  posture, strength, and muscle length  Baseline:  Goal status: INITIAL   3.  Pt to be I with coordination of breathing mechanics and core activation for improved pelvic stability with standing and single leg tasks to decrease pain at back and pelvis.  Baseline:  Goal status: ongoing   4.  Pt to demonstrate at least 5/5 bil hip strength for improved pelvic stability and functional squats without leakage.  Baseline:  Goal status: INITIAL     PLAN:   PT FREQUENCY: 1-2x/week   PT DURATION: other: 5 weeks   PLANNED INTERVENTIONS: Therapeutic exercises, Therapeutic activity, Neuromuscular re-education, Patient/Family education, Self Care, Joint mobilization, DME instructions, Aquatic Therapy, Dry Needling, Spinal mobilization, Cryotherapy, Moist heat, scar mobilization, Taping, Biofeedback, and Manual therapy   PLAN FOR NEXT SESSION: continue KT taping for abdominal support during pregnancy as long as skin allows, TA activations with transfers and rolling for decreased strain/coning at abdomen, diaphragmatic breathing training for rib mobility and core activations, glute and opp lat activations for strengthening posterior sling, spine and hip stretching, hip IR mobility and strengthening.   Stacy Gardner, PT, DPT 01/02/241:19 PM

## 2022-05-04 ENCOUNTER — Ambulatory Visit (INDEPENDENT_AMBULATORY_CARE_PROVIDER_SITE_OTHER): Payer: 59 | Admitting: Family Medicine

## 2022-05-04 ENCOUNTER — Other Ambulatory Visit: Payer: Self-pay

## 2022-05-04 ENCOUNTER — Ambulatory Visit: Payer: 59 | Admitting: Physical Therapy

## 2022-05-04 ENCOUNTER — Encounter: Payer: Self-pay | Admitting: Family Medicine

## 2022-05-04 VITALS — BP 121/74 | HR 86 | Wt 243.0 lb

## 2022-05-04 DIAGNOSIS — R293 Abnormal posture: Secondary | ICD-10-CM | POA: Diagnosis not present

## 2022-05-04 DIAGNOSIS — Z3A38 38 weeks gestation of pregnancy: Secondary | ICD-10-CM

## 2022-05-04 DIAGNOSIS — F411 Generalized anxiety disorder: Secondary | ICD-10-CM

## 2022-05-04 DIAGNOSIS — M6281 Muscle weakness (generalized): Secondary | ICD-10-CM

## 2022-05-04 DIAGNOSIS — R279 Unspecified lack of coordination: Secondary | ICD-10-CM

## 2022-05-04 DIAGNOSIS — Z2839 Other underimmunization status: Secondary | ICD-10-CM

## 2022-05-04 DIAGNOSIS — Z3490 Encounter for supervision of normal pregnancy, unspecified, unspecified trimester: Secondary | ICD-10-CM

## 2022-05-04 DIAGNOSIS — O09899 Supervision of other high risk pregnancies, unspecified trimester: Secondary | ICD-10-CM

## 2022-05-04 DIAGNOSIS — M62838 Other muscle spasm: Secondary | ICD-10-CM | POA: Diagnosis not present

## 2022-05-04 DIAGNOSIS — Z3480 Encounter for supervision of other normal pregnancy, unspecified trimester: Secondary | ICD-10-CM

## 2022-05-04 NOTE — Therapy (Signed)
OUTPATIENT PHYSICAL THERAPY TREATMENT NOTE   Patient Name: Cindy Jones MRN: [id number] DOB:Mar 11, 1991, 32 y.o., female Today's Date: 05/04/2022  PCP: Donnamae Jude, MD REFERRING PROVIDER: Manya Silvas, CNM  END OF SESSION:   PT End of Session - 05/04/22 1148     Visit Number 10    Date for PT Re-Evaluation 05/16/22    Authorization Type [employer] focus    PT Start Time 3545    PT Stop Time 1225    PT Time Calculation (min) 40 min    Activity Tolerance Patient tolerated treatment well    Behavior During Therapy WFL for tasks assessed/performed               Past Medical History:  Diagnosis Date   Anxiety    Asthma    as a child   Binge eating disorder    Dysrhythmia    " I feel it about once a month"   Frequent headaches    GAD (generalized anxiety disorder)    GERD (gastroesophageal reflux disease)    History of acute PID 06/09/2019   Major depression in complete remission ([place])    Migraines    PID (acute pelvic inflammatory disease)    Seasonal allergies    Tourette disorder    Urinary tract bacterial infections    Past Surgical History:  Procedure Laterality Date   CHOLECYSTECTOMY     ESOPHAGOGASTRODUODENOSCOPY     LAPAROSCOPIC CHOLECYSTECTOMY SINGLE SITE WITH INTRAOPERATIVE CHOLANGIOGRAM N/A 03/15/2016   Procedure: LAPAROSCOPIC CHOLECYSTECTOMY SINGLE SITE WITH INTRAOPERATIVE CHOLANGIOGRAM;  Surgeon: Michael Boston, MD;  Location: [facility];  Service: General;  Laterality: N/A;   TONSILLECTOMY AND ADENOIDECTOMY  2013   WISDOM TOOTH EXTRACTION     Patient Active Problem List   Diagnosis Date Noted   Rubella non-immune status, antepartum 12/07/2021   Major depressive disorder, single episode, in full remission ([place]) 11/02/2021   Supervision of other normal pregnancy, antepartum 10/11/2021   History of asthma 09/23/2020   Urticaria 08/24/2020   Other adverse food reactions, not elsewhere classified, subsequent encounter 08/24/2020   Other allergic  rhinitis 08/24/2020   Exercise-induced asthma 10/27/2019   GAD (generalized anxiety disorder) 11/07/2018   Overweight (BMI 25.0-29.9) 06/03/2018   Bulimia 04/20/2015   ADD (attention deficit disorder) 01/15/2014   Tourette disease 10/18/2013    REFERRING DIAG: Z3A.33 (ICD-10-CM) - [redacted] weeks gestation of pregnancy O26.893,R10.2 (ICD-10-CM) - Pelvic pressure in pregnancy, antepartum, third trimester  THERAPY DIAG:  Muscle weakness (generalized)  Abnormal posture  Unspecified lack of coordination  Other muscle spasm  Rationale for Evaluation and Treatment Rehabilitation  ONSET DATE: [redacted] weeks pregnant  PERTINENT HISTORY: PERTINENT HISTORY:  low-risk pregnancy and has Tourette disease; ADD (attention deficit disorder); Bulimia; Overweight (BMI 25.0-29.9); GAD (generalized anxiety disorder); Exercise-induced asthma; Urticaria; Sexual abuse: No    BOWEL MOVEMENT: Pain with bowel movement: No Type of bowel movement:Type (Bristol Stool Scale) 4, Frequency daily, and Strain No Fully empty rectum: Yes:   Leakage: No Pads: No Fiber supplement: Yes: miralax and magnesium    URINATION: Pain with urination: No Fully empty bladder: Yes:   Stream: Strong Urgency: Yes:   Frequency: increased with pregnancy, sometimes 2-3x times Leakage:  full bladder with sneezing may have small leakage Pads: No   INTERCOURSE: Pain with intercourse: Initial Penetration and During Penetration Ability to have vaginal penetration:  Yes:   Climax: not painful Marinoff Scale: 1/3   PREGNANCY: Vaginal deliveries 0 Tearing No C-section deliveries 0 Currently pregnant  Yes: 33 weeks   PROLAPSE: None  PRECAUTIONS: PRECAUTIONS: Other: pregnant  SUBJECTIVE:                                                                                                                                                                                      SUBJECTIVE STATEMENT:  Pt reports she has been able to walk  about 1 mile every evening and tolerating this well. Did have pain last night at Rt groin/pelvis   PAIN:  Are you having pain? 4/10 now, last night 7/10 Rt groin/pelvis    OBJECTIVE: (objective measures completed at initial evaluation unless otherwise dated)   DIAGNOSTIC FINDINGS:    COGNITION: Overall cognitive status: Within functional limits for tasks assessed                          SENSATION: Light touch: Appears intact Proprioception: Appears intact   MUSCLE LENGTH: Bil hamstrings and adductors limited by 25%   SINGLE LEG STANCE: (+) bil for pain and hip drop    POSTURE: rounded shoulders, forward head, and anterior pelvic tilt   PELVIC ALIGNMENT: anterior tilt with limited mobility for posterior tilting    LUMBARAROM/PROM:   A/PROM A/PROM  eval  Flexion Limited by 25%  Extension Limited by 25%  Right lateral flexion Limited by 50%  Left lateral flexion Limited by 50%  Right rotation Limited by 25%  Left rotation Limited by 25%   (Blank rows = not tested)   LOWER EXTREMITY ROM:   WFL   LOWER EXTREMITY MMT:   Bil hip abduction and adduction 3+/5 however limited with pelvic pain, flexion 4/5, ext 4/5 but back pain; knees and ankles 5/5   PALPATION:   General  mild TTP at bil groin and lower abdominal quadrants with fascial restrictions                  External Perineal Exam deferred                              Internal Pelvic Floor deferred    Patient confirms identification and approves PT to assess internal pelvic floor and treatment No   PELVIC MMT:   MMT eval  Vaginal    Internal Anal Sphincter    External Anal Sphincter    Puborectalis    Diastasis Recti    (Blank rows = not tested)         TONE: Deferred    PROLAPSE: deferred   TODAY'S TREATMENT:          DATE:   05/02/22  Seated green yoga ball: 2x5 circles CW/CCW, ant/post x10 tilts, Rt/Lt x10 tilts, x5 30 hip shifts Quad hip IR green band 2x10 Elevated lunge on first step  with rotation 2x30s Frog stretch on bosu 2x30s with pelvic relaxation cues Sitting on towel roll for pelvic relaxation 3x30s - pt reported relief from tension/pain felt at Rt side      PATIENT EDUCATION:  Education details: [email or web address] Person educated: Patient Education method: Explanation, Demonstration, Tactile cues, Verbal cues, and Handouts Education comprehension: verbalized understanding and returned demonstration   HOME EXERCISE PROGRAM: 04/04/22:  Access Code: [email or web address] URL: [email or web address] Date: 04/04/2022 Prepared by: Venetia Night Beuhring  Exercises - Quadruped Rocking Slow  - 1 x daily - 7 x weekly - 1 sets - 5 reps - 5 hold - Cat to Child's Pose with Posterior Pelvic Tilt  - 1 x daily - 7 x weekly - 1 sets - 5 reps - 5 hold - Sit to Stand with Ball Between Knees  - 1 x daily - 7 x weekly - 1 sets - 1 reps - 10 hold - Seated Shoulder Flexion with Dumbbells  - 1 x daily - 7 x weekly - 1 sets - 1 reps - 10 hold  Evaluation: Access Code: [email or web address] URL: [email or web address] Date: 04/04/2022 Prepared by: Baruch Merl  Program Notes Addition: Hip shifting x10 - sitting with feet flat on ground, shift one hip forward. Bringing one knee pushed forward then return it back and then do the other knee forward.   Exercises - Seated Diaphragmatic Breathing  - 1 x daily - 7 x weekly - 1 sets - 10 reps - Quadruped Abdominal and Pelvic Brace  - 1 x daily - 7 x weekly - 1 sets - 10 reps - Tail Wag  - 1 x daily - 7 x weekly - 1 sets - 10 reps - 3s holds - Quadruped Cat Cow  - 1 x daily - 7 x weekly - 1 sets - 10 reps - 3s holds - Unilateral Supported Supine Butterfly Stretch  - 1 x daily - 7 x weekly - 1 sets - 2 reps - 30s holds - Supine Hip Internal and External Rotation  - 1 x daily - 7 x weekly - 1 sets - 2 reps - 30s holds - Sidelying Open Book Thoracic Rotation with Knee on Foam Roll  - 1 x daily - 7 x weekly - 1 sets - 2 reps - 30s holds - Seated  Alternating Side Stretch with Arm Overhead  - 1 x daily - 7 x weekly - 1 sets - 2 reps - 30s holds - Pelvic Tilt on Swiss Ball  - 1 x daily - 7 x weekly - 1 sets - 10 reps   ASSESSMENT:   CLINICAL IMPRESSION: Patient reports she had pain at Rt pelvis/groin last night but overall pain has been much better. Tape continues to help.  Pt session focused on pelvic mobility and breathing mechanics for pelvic relaxation in various positions. Pt able to talk through all exercises and reported no contractions or pain. Pt tolerated well. Therapist monitoring response to all interventions and modifying treatment accordingly. Pt agreeable to DC today, reports she feels confident with continuing HEP and relaxation techniques. All goals met and pt understands she will need new referral for future PT needs.     OBJECTIVE IMPAIRMENTS: decreased coordination, decreased endurance, decreased mobility, decreased strength, increased fascial restrictions, increased muscle spasms, impaired flexibility, improper body mechanics, postural dysfunction, and pain.  ACTIVITY LIMITATIONS: carrying, lifting, bending, sitting, standing, squatting, sleeping, stairs, transfers, bed mobility, dressing, and locomotion level   PARTICIPATION LIMITATIONS: interpersonal relationship, driving, shopping, community activity, and occupation   PERSONAL FACTORS: Fitness, Time since onset of injury/illness/exacerbation, and 1 comorbidity: pregnant  are also affecting patient's functional outcome.    REHAB POTENTIAL: Good   CLINICAL DECISION MAKING: Stable/uncomplicated   EVALUATION COMPLEXITY: Low     GOALS: Goals reviewed with patient? Yes   SHORT TERM GOALS: Target date: 04/29/23   Pt to be I with HEP.  Baseline: Goal status: MET   2.  Pt will report no more than 6/10 pelvic pain due to improvements in posture, strength, and muscle length  Baseline:  Goal status: MET   3.  Pt to demonstrate I  body mechanics with bed  mobility, transfers, standing and sitting posture to decrease pain at back and pelvis during pregnancy without cues.  Baseline:  Goal status: MET   LONG TERM GOALS: Target date: 05/16/22   Pt to be I with advanced HEP.  Baseline:  Goal status: MET   2.  Pt will report no more than 4/10 pelvic pain due to improvements in posture, strength, and muscle length  Baseline:  Goal status: MET   3.  Pt to be I with coordination of breathing mechanics and core activation for improved pelvic stability with standing and single leg tasks to decrease pain at back and pelvis.  Baseline:  Goal status: MET   4.  Pt to demonstrate at least 5/5 bil hip strength for improved pelvic stability and functional squats without leakage.  Baseline:  Goal status: MET     PLAN:   PT FREQUENCY: 1-2x/week   PT DURATION: other: 5 weeks   PLANNED INTERVENTIONS: Therapeutic exercises, Therapeutic activity, Neuromuscular re-education, Patient/Family education, Self Care, Joint mobilization, DME instructions, Aquatic Therapy, Dry Needling, Spinal mobilization, Cryotherapy, Moist heat, scar mobilization, Taping, Biofeedback, and Manual therapy   PLAN FOR NEXT SESSION:   PHYSICAL THERAPY DISCHARGE SUMMARY  Visits from Start of Care: 10  Current functional level related to goals / functional outcomes: All goals met     Remaining deficits: All goals met   Education / Equipment: HEP   Patient agrees to discharge. Patient goals were met. Patient is being discharged due to being pleased with the current functional level.   Stacy Gardner, PT, DPT 05/04/2410:31 PM

## 2022-05-04 NOTE — Progress Notes (Signed)
   Subjective:  Cindy Jones is a 32 y.o. G2P0010 at 1w2dbeing seen today for ongoing prenatal care.  She is currently monitored for the following issues for this low-risk pregnancy and has Tourette disease; ADD (attention deficit disorder); Bulimia; Overweight (BMI 25.0-29.9); GAD (generalized anxiety disorder); Exercise-induced asthma; Urticaria; Other adverse food reactions, not elsewhere classified, subsequent encounter; Other allergic rhinitis; History of asthma; Supervision of other normal pregnancy, antepartum; Major depressive disorder, [place] and Rubella non-immune status, antepartum on their problem list.  Patient reports no complaints.  Contractions: Irritability. Vag. Bleeding: None.  Movement: Present. Denies leaking of fluid.   The following portions of the patient's history were reviewed and updated as appropriate: allergies, current medications, past family history, past medical history, past social history, past surgical history and problem list. Problem list updated.  Objective:   Vitals:   05/04/22 1030  BP: 121/74  Pulse: 86  Weight: 243 lb (110.2 kg)    Fetal Status: Fetal Heart Rate (bpm): 145   Movement: Present  Presentation: Vertex  General:  Alert, oriented and cooperative. Patient is in no acute distress.  Skin: Skin is warm and dry. No rash noted.   Cardiovascular: Normal heart rate noted  Respiratory: Normal respiratory effort, no problems with respiration noted  Abdomen: Soft, gravid, appropriate for gestational age. Pain/Pressure: Present     Pelvic: Vag. Bleeding: None     Cervical exam performed Dilation: 1 Effacement (%): 80 Station: -2  Extremities: Normal range of motion.     Mental Status: Normal mood and affect. Normal behavior. Normal judgment and thought content.   Urinalysis:      Assessment and Plan:  Pregnancy: G2P0010 at 39w2d1. Supervision of other normal pregnancy, antepartum BP and FHR  normal Felt large movement, presentation checked with [place] baby is vertex Discussed IOL at 41 weeks, she'd prefer closer to 40 weeks Plan for FB on 05/16/22, [place] IOL on 05/17/21 Form faxed, orders placed  2. Rubella non-immune status, antepartum Offer MMR PP  3. GAD (generalized anxiety disorder) Following w JaRoselyn ReefTerm labor symptoms and general obstetric precautions including but not limited to vaginal bleeding, contractions, leaking of fluid and fetal movement were reviewed in detail with the patient. Please refer to After Visit Summary for other counseling recommendations.  Return in 1 week (on 05/11/2022) for Dyad patient, ob visit.   EcClarnce FlockMD

## 2022-05-04 NOTE — Patient Instructions (Signed)

## 2022-05-05 ENCOUNTER — Telehealth (HOSPITAL_COMMUNITY): Payer: Self-pay | Admitting: *Deleted

## 2022-05-05 ENCOUNTER — Ambulatory Visit: Payer: 59 | Admitting: Clinical

## 2022-05-05 ENCOUNTER — Encounter (HOSPITAL_COMMUNITY): Payer: Self-pay

## 2022-05-05 DIAGNOSIS — F411 Generalized anxiety disorder: Secondary | ICD-10-CM

## 2022-05-05 DIAGNOSIS — Z8659 Personal history of other mental and behavioral disorders: Secondary | ICD-10-CM

## 2022-05-05 DIAGNOSIS — F988 Other specified behavioral and emotional disorders with onset usually occurring in childhood and adolescence: Secondary | ICD-10-CM

## 2022-05-05 NOTE — Telephone Encounter (Signed)
Hello Cindy Jones   I am trying to reach you to review your Medical History and answer any questions you have regarding your Induction of Labor.  My number is [phone].  I am in the office from 8:30-4:30 Monday thru Friday.    Teresa Coombs MSN RNC Preadmission NursePreadmission screen

## 2022-05-10 ENCOUNTER — Other Ambulatory Visit: Payer: Self-pay | Admitting: Advanced Practice Midwife

## 2022-05-10 ENCOUNTER — Telehealth (HOSPITAL_COMMUNITY): Payer: Self-pay | Admitting: *Deleted

## 2022-05-10 ENCOUNTER — Encounter (HOSPITAL_COMMUNITY): Payer: Self-pay | Admitting: *Deleted

## 2022-05-10 NOTE — Telephone Encounter (Signed)
Preadmission screen  

## 2022-05-11 ENCOUNTER — Other Ambulatory Visit: Payer: Self-pay

## 2022-05-11 ENCOUNTER — Ambulatory Visit (INDEPENDENT_AMBULATORY_CARE_PROVIDER_SITE_OTHER): Payer: Self-pay | Admitting: Family Medicine

## 2022-05-11 ENCOUNTER — Encounter: Payer: Self-pay | Admitting: Family Medicine

## 2022-05-11 ENCOUNTER — Encounter: Payer: Self-pay | Admitting: General Practice

## 2022-05-11 VITALS — BP 115/76 | HR 93 | Wt 242.4 lb

## 2022-05-11 DIAGNOSIS — O09899 Supervision of other high risk pregnancies, unspecified trimester: Secondary | ICD-10-CM

## 2022-05-11 DIAGNOSIS — Z3480 Encounter for supervision of other normal pregnancy, unspecified trimester: Secondary | ICD-10-CM

## 2022-05-11 DIAGNOSIS — Z3A39 39 weeks gestation of pregnancy: Secondary | ICD-10-CM

## 2022-05-11 DIAGNOSIS — Z2839 Other underimmunization status: Secondary | ICD-10-CM

## 2022-05-11 DIAGNOSIS — F411 Generalized anxiety disorder: Secondary | ICD-10-CM

## 2022-05-11 NOTE — Patient Instructions (Signed)

## 2022-05-11 NOTE — Progress Notes (Signed)
   Subjective:  Cindy Jones is a 32 y.o. G2P0010 at [redacted]w[redacted]d being seen today for ongoing prenatal care.  She is currently monitored for the following issues for this low-risk pregnancy and has Tourette disease; ADD (attention deficit disorder); Bulimia; Overweight (BMI 25.0-29.9); GAD (generalized anxiety disorder); Exercise-induced asthma; Urticaria; Other adverse food reactions, not elsewhere classified, subsequent encounter; Other allergic rhinitis; History of asthma; Supervision of other normal pregnancy, antepartum; Major depressive disorder, single episode, in full remission ([place]); and Rubella non-immune status, antepartum on their problem list.  Patient reports no complaints.  Contractions: Irritability. Vag. Bleeding: None.  Movement: Present. Denies leaking of fluid.   The following portions of the patient's history were reviewed and updated as appropriate: allergies, current medications, past family history, past medical history, past social history, past surgical history and problem list. Problem list updated.  Objective:   Vitals:   05/11/22 1007  BP: 115/76  Pulse: 93  Weight: 242 lb 6.4 oz (110 kg)    Fetal Status: Fetal Heart Rate (bpm): 136   Movement: Present  Presentation: Vertex  General:  Alert, oriented and cooperative. Patient is in no acute distress.  Skin: Skin is warm and dry. No rash noted.   Cardiovascular: Normal heart rate noted  Respiratory: Normal respiratory effort, no problems with respiration noted  Abdomen: Soft, gravid, appropriate for gestational age. Pain/Pressure: Present     Pelvic: Vag. Bleeding: None     Cervical exam performed Dilation: 1 Effacement (%): 80 Station: -1  Extremities: Normal range of motion.     Mental Status: Normal mood and affect. Normal behavior. Normal judgment and thought content.   Urinalysis:      Assessment and Plan:  Pregnancy: G2P0010 at [redacted]w[redacted]d  1. Supervision of other normal pregnancy, antepartum BP and FHR  normal Scheduled for outpt FB at next visit and IOL on 05/17/22 Cervix unchanged, membrane sweep performed per request  2. Rubella non-immune status, antepartum Offer MMR PP  3. GAD (generalized anxiety disorder) Following w Roselyn Reef  Term labor symptoms and general obstetric precautions including but not limited to vaginal bleeding, contractions, leaking of fluid and fetal movement were reviewed in detail with the patient. Please refer to After Visit Summary for other counseling recommendations.  Return in 1 week (on 05/18/2022) for Dyad patient, ob visit, outpatient FB.   Clarnce Flock, MD

## 2022-05-14 ENCOUNTER — Encounter (HOSPITAL_COMMUNITY): Payer: Self-pay | Admitting: Obstetrics and Gynecology

## 2022-05-14 ENCOUNTER — Inpatient Hospital Stay (EMERGENCY_DEPARTMENT_HOSPITAL)
Admission: AD | Admit: 2022-05-14 | Discharge: 2022-05-14 | Disposition: A | Discharge status: Home / Self Care | Payer: 59 | Source: Home / Self Care | Attending: Obstetrics and Gynecology | Admitting: Obstetrics and Gynecology

## 2022-05-14 DIAGNOSIS — Z3493 Encounter for supervision of normal pregnancy, unspecified, third trimester: Secondary | ICD-10-CM

## 2022-05-14 DIAGNOSIS — Z3A39 39 weeks gestation of pregnancy: Secondary | ICD-10-CM | POA: Insufficient documentation

## 2022-05-14 DIAGNOSIS — Z3689 Encounter for other specified antenatal screening: Secondary | ICD-10-CM

## 2022-05-14 DIAGNOSIS — O368130 Decreased fetal movements, third trimester, not applicable or unspecified: Secondary | ICD-10-CM

## 2022-05-14 DIAGNOSIS — O4292 Full-term premature rupture of membranes, unspecified as to length of time between rupture and onset of labor: Secondary | ICD-10-CM | POA: Diagnosis not present

## 2022-05-14 NOTE — MAU Provider Note (Signed)
[name] is a 32 y.o. G2P0010 pregnant female at [redacted]w[redacted]d who presents to MAU today with complaint of decreased fetal movement all day, tried various things to get baby moving but did not help. She has been feeling baby just not as vigorously as normal. States this usually happens at night but did not when she laid down this evening so she came in. No other physical complaints - specifically denies vaginal bleeding or cramping/contractions. Strongly desires to wait for labor induction on Tuesday if possible.   Rece[facility] at [facility]. Prenatal records reviewed.  Pertinent items noted in HPI and remainder of comprehensive ROS otherwise negative.   O BP 111/75 (BP Location: Right Arm)   Pulse 80   Temp 98.1 F (36.7 C) (Oral)   Resp 17   Ht 5\' 6"  (1.676 m)   Wt 244 lb 8 oz (110.9 kg)   LMP 08/09/2021 (Exact Date)   SpO2 100%   BMI 39.46 kg/m  Physical Exam Vitals and nursing note reviewed.  Constitutional:      Appearance: Normal appearance.  HENT:     Head: Normocephalic and atraumatic.  Eyes:     Pupils: Pupils are equal, round, and reactive to light.  Cardiovascular:     Rate and Rhythm: Normal rate and regular rhythm.  Pulmonary:     Effort: Pulmonary effort is normal.  Abdominal:     Palpations: Abdomen is soft.  Musculoskeletal:        General: Normal range of motion.     Cervical back: Normal range of motion.  Skin:    General: Skin is warm and dry.     Capillary Refill: Capillary refill takes less than 2 seconds.  Neurological:     Mental Status: She is alert and oriented to person, place, and time.  Psychiatric:        Mood and Affect: Mood normal.        Behavior: Behavior normal.        Thought Content: Thought content normal.        Judgment: Judgment normal.    Fetal Tracing: reactive with audible and palpable fetal movement Baseline: 120 Variability: moderate Accelerations: 15x15 Decelerations: none Toco: UI  MDM: Straightforward  MAU  Course: Baby immediately vigorous on admission to MAU and application of monitors. Remained so for entirety of visit. Pt very motivated to wait until Tuesday for labor. Stable for discharge home.  A Decreased fetal movements in third trimester, single or unspecified fetus - Plan: Discharge patient  NST (non-stress test) reactive  Movement of fetus present during pregnancy in third trimester  [redacted] weeks gestation of pregnancy   P Discharge from MAU in stable condition with term labor precautions Follow up at [facility] as scheduled for ongoing prenatal care  Allergies as of 05/14/2022       Reactions   Latex Swelling, Rash   Barley Grass Hives   Cranberry Juice Powder Other (See Comments)   Throat pain.   Naproxen Other (See Comments)   "Liver starts hurting" Diarrhea   Nickel    Skin peeling   Sulfa Antibiotics Hives, Swelling        Medication List     TAKE these medications    acetaminophen 325 MG tablet Commonly known as: TYLENOL Take 650 mg by mouth every 6 (six) hours as needed.   cetirizine 10 MG tablet Commonly known as: ZyrTEC Allergy Take 1 tablet (10 mg total) by mouth 2 (two) times daily.  FOLIC ACID PO Take 1 tablet by mouth daily.   magnesium 30 MG tablet Take 30 mg by mouth 2 (two) times daily.   prenatal multivitamin Tabs tablet Take 1 tablet by mouth daily at 12 noon.   promethazine 12.5 MG tablet Commonly known as: PHENERGAN Take 12.5 mg by mouth every 8 (eight) hours as needed for nausea or vomiting.        Gabriel Carina, [place] 05/14/2022 2:44 AM

## 2022-05-14 NOTE — MAU Note (Signed)
..  Cindy Jones is a 32 y.o. at [redacted]w[redacted]d here in MAU reporting: no fetal movement for 1.5hrs and decreased fetal movement all day. Reports occasional contractions. Denies vaginal bleeding or leaking of fluid.   Pain score: 2/10 Vitals:   05/14/22 0123  BP: 111/75  Pulse: 80  Resp: 19  Temp: 98.4 F (36.9 C)  SpO2: 100%

## 2022-05-14 NOTE — MAU Note (Signed)
Pt reports over 14 movements-pt requesting discharge home.

## 2022-05-14 NOTE — MAU Note (Signed)
Pt reports baby has been very active since arrival to MAU

## 2022-05-15 ENCOUNTER — Encounter (HOSPITAL_COMMUNITY): Payer: Self-pay | Admitting: Family Medicine

## 2022-05-15 ENCOUNTER — Inpatient Hospital Stay (HOSPITAL_COMMUNITY)
Admission: AD | Admit: 2022-05-15 | Discharge: 2022-05-18 | DRG: 807 | Disposition: A | Discharge status: Home / Self Care | Payer: 59 | Attending: Family Medicine | Admitting: Family Medicine

## 2022-05-15 ENCOUNTER — Other Ambulatory Visit: Payer: Self-pay

## 2022-05-15 DIAGNOSIS — O99344 Other mental disorders complicating childbirth: Secondary | ICD-10-CM | POA: Diagnosis not present

## 2022-05-15 DIAGNOSIS — O4292 Full-term premature rupture of membranes, unspecified as to length of time between rupture and onset of labor: Principal | ICD-10-CM | POA: Diagnosis present

## 2022-05-15 DIAGNOSIS — Z91040 Latex allergy status: Secondary | ICD-10-CM

## 2022-05-15 DIAGNOSIS — D649 Anemia, unspecified: Secondary | ICD-10-CM | POA: Diagnosis not present

## 2022-05-15 DIAGNOSIS — O99214 Obesity complicating childbirth: Secondary | ICD-10-CM | POA: Diagnosis present

## 2022-05-15 DIAGNOSIS — Z3A39 39 weeks gestation of pregnancy: Secondary | ICD-10-CM

## 2022-05-15 DIAGNOSIS — O480 Post-term pregnancy: Secondary | ICD-10-CM | POA: Diagnosis present

## 2022-05-15 DIAGNOSIS — O4202 Full-term premature rupture of membranes, onset of labor within 24 hours of rupture: Secondary | ICD-10-CM | POA: Diagnosis not present

## 2022-05-15 DIAGNOSIS — Z3A40 40 weeks gestation of pregnancy: Secondary | ICD-10-CM | POA: Diagnosis not present

## 2022-05-15 DIAGNOSIS — O09899 Supervision of other high risk pregnancies, unspecified trimester: Secondary | ICD-10-CM

## 2022-05-15 DIAGNOSIS — O9902 Anemia complicating childbirth: Secondary | ICD-10-CM | POA: Diagnosis not present

## 2022-05-15 DIAGNOSIS — Z3480 Encounter for supervision of other normal pregnancy, unspecified trimester: Principal | ICD-10-CM

## 2022-05-15 DIAGNOSIS — Z2839 Other underimmunization status: Secondary | ICD-10-CM

## 2022-05-15 DIAGNOSIS — O368130 Decreased fetal movements, third trimester, not applicable or unspecified: Secondary | ICD-10-CM | POA: Diagnosis not present

## 2022-05-15 LAB — TYPE AND SCREEN
ABO/RH(D): B POS
Antibody Screen: NEGATIVE

## 2022-05-15 LAB — CBC
HCT: 32.5 % — ABNORMAL LOW (ref 36.0–46.0)
Hemoglobin: 10.8 g/dL — ABNORMAL LOW (ref 12.0–15.0)
MCH: 30.9 pg (ref 26.0–34.0)
MCHC: 33.2 g/dL (ref 30.0–36.0)
MCV: 93.1 fL (ref 80.0–100.0)
Platelets: 245 10*3/uL (ref 150–400)
RBC: 3.49 MIL/uL — ABNORMAL LOW (ref 3.87–5.11)
RDW: 12.3 % (ref 11.5–15.5)
WBC: 11.3 10*3/uL — ABNORMAL HIGH (ref 4.0–10.5)
nRBC: 0 % (ref 0.0–0.2)

## 2022-05-15 LAB — POCT FERN TEST: POCT Fern Test: POSITIVE

## 2022-05-15 MED ORDER — FENTANYL CITRATE (PF) 100 MCG/2ML IJ SOLN
50.0000 ug | INTRAMUSCULAR | Status: DC | PRN
  Administered 2022-05-16 (×4): 50 ug via INTRAVENOUS
  Filled 2022-05-15 (×4): qty 2

## 2022-05-15 MED ORDER — LACTATED RINGERS IV SOLN: 500.0000 mL | INTRAVENOUS | Status: DC | PRN

## 2022-05-15 MED ORDER — LIDOCAINE HCL (PF) 1 % IJ SOLN: 30.0000 mL | INTRAMUSCULAR | Status: DC | PRN

## 2022-05-15 MED ORDER — ACETAMINOPHEN 325 MG PO TABS
650.0000 mg | ORAL_TABLET | ORAL | Status: DC | PRN
  Administered 2022-05-16 (×2): 650 mg via ORAL
  Filled 2022-05-15 (×2): qty 2

## 2022-05-15 MED ORDER — ONDANSETRON HCL 4 MG/2ML IJ SOLN
4.0000 mg | Freq: Four times a day (QID) | INTRAMUSCULAR | Status: DC | PRN
  Administered 2022-05-16 (×3): 4 mg via INTRAVENOUS
  Filled 2022-05-15 (×3): qty 2

## 2022-05-15 MED ORDER — LACTATED RINGERS IV SOLN: INTRAVENOUS | Status: DC

## 2022-05-15 MED ORDER — OXYTOCIN BOLUS FROM INFUSION
333.0000 mL | Freq: Once | INTRAVENOUS | Status: AC
  Administered 2022-05-16: 333 mL via INTRAVENOUS

## 2022-05-15 MED ORDER — TERBUTALINE SULFATE 1 MG/ML IJ SOLN: 0.2500 mg | Freq: Once | INTRAMUSCULAR | Status: DC | PRN

## 2022-05-15 MED ORDER — OXYTOCIN-SODIUM CHLORIDE 30-0.9 UT/500ML-% IV SOLN
2.5000 [IU]/h | INTRAVENOUS | Status: DC
  Administered 2022-05-16: 2.5 [IU]/h via INTRAVENOUS
  Filled 2022-05-15: qty 500

## 2022-05-15 MED ORDER — SOD CITRATE-CITRIC ACID 500-334 MG/5ML PO SOLN: 30.0000 mL | ORAL | Status: DC | PRN

## 2022-05-15 MED ORDER — OXYTOCIN-SODIUM CHLORIDE 30-0.9 UT/500ML-% IV SOLN
1.0000 m[IU]/min | INTRAVENOUS | Status: DC
  Administered 2022-05-15: 2 m[IU]/min via INTRAVENOUS
  Administered 2022-05-16: 22 m[IU]/min via INTRAVENOUS
  Filled 2022-05-15: qty 500

## 2022-05-15 NOTE — MAU Note (Signed)
Cindy Jones is a 32 y.o. at [redacted]w[redacted]d here in MAU reporting: trickling of fluid since 1600. Fluid is clear. No bleeding. Feeling some pressure but no contractions. +FM  Onset of complaint: today  Pain score: 0/10  Vitals:   05/15/22 1857  BP: 107/68  Pulse: 85  Resp: 16  Temp: 98.6 F (37 C)  SpO2: 98%     FHT:170  Lab orders placed from triage: none

## 2022-05-15 NOTE — H&P (Addendum)
OBSTETRIC ADMISSION HISTORY AND PHYSICAL  Cindy Jones is a 32 y.o. female G2P0010 with IUP at [redacted]w[redacted]d by early [place] presenting for PROM @ 1600. She reports +FMs, no VB, no blurry vision, headaches or peripheral edema, and RUQ pain.  She plans on breast feeding. She request condoms for birth control. She received her prenatal care at [facility]   Dating: By early [place] --->  Estimated Date of Delivery: 05/16/22  Sono: @[redacted]w[redacted]d , CWD, normal anatomy, cephalic presentation, anterior placenta, 2734g, 55% EFW   Prenatal History/Complications:  Obesity  Past Medical History: Past Medical History:  Diagnosis Date   Anxiety    Asthma    as a child   Binge eating disorder    Dysrhythmia    " I feel it about once a month"   Frequent headaches    GAD (generalized anxiety disorder)    GERD (gastroesophageal reflux disease)    History of acute PID 06/09/2019   Major depression in complete remission ([place])    Migraines    PID (acute pelvic inflammatory disease)    Seasonal allergies    Tourette disorder    Urinary tract bacterial infections     Past Surgical History: Past Surgical History:  Procedure Laterality Date   CHOLECYSTECTOMY     ESOPHAGOGASTRODUODENOSCOPY     LAPAROSCOPIC CHOLECYSTECTOMY SINGLE SITE WITH INTRAOPERATIVE CHOLANGIOGRAM N/A 03/15/2016   Procedure: LAPAROSCOPIC CHOLECYSTECTOMY SINGLE SITE WITH INTRAOPERATIVE CHOLANGIOGRAM;  Surgeon: Michael Boston, MD;  Location: [facility];  Service: General;  Laterality: N/A;   TONSILLECTOMY AND ADENOIDECTOMY  2013   WISDOM TOOTH EXTRACTION      Obstetrical History: OB History     Gravida  2   Para  0   Term  0   Preterm  0   AB  1   Living  0      SAB  1   IAB  0   Ectopic  0   Multiple  0   Live Births              Social History Social History   Socioeconomic History   Marital status: Married    Spouse name: Not on file   Number of children: 0   Years of education: Not on file   Highest education level: Not on  file  Occupational History   Occupation: Administrator porfolio  Tobacco Use   Smoking status: Never   Smokeless tobacco: Never  Vaping Use   Vaping Use: Never used  Substance and Sexual Activity   Alcohol use: Not Currently    Alcohol/week: 1.0 standard drink of alcohol    Types: 1 Glasses of wine per week    Comment: occ   Drug use: No   Sexual activity: Yes    Partners: Male    Birth control/protection: Condom    Comment: last sexual intercourse 1/14  Other Topics Concern   Not on file  Social History Narrative   - Married, no children.   - Secretary/administrator education.   - She works FT as a Physiological scientist.   - Her aunt & uncle were her guardians when she was in [place]. They live in [place], [place].    - She has a fraternal twin sister.    - Wears her seatbelt, smoke detectors in the home.   Right handed   Social Determinants of Health   Financial Resource Strain: Not on file  Food Insecurity: No Food Insecurity (05/15/2022)   Hunger Vital Sign  Worried About Programme researcher, broadcasting/film/video in the Last Year: Never true    Ran Out of Food in the Last Year: Never true  Transportation Needs: No Transportation Needs (05/15/2022)   PRAPARE - Administrator, Civil Service (Medical): No    Lack of Transportation (Non-Medical): No  Physical Activity: Not on file  Stress: Not on file  Social Connections: Not on file    Family History: Family History  Problem Relation Age of Onset   Alcohol abuse Mother    Bipolar disorder Mother    Cancer Father        sarcoma; passed away when pt was 8   Asthma Sister    Allergic rhinitis Sister    Eczema Sister    Emphysema Maternal Grandmother    Tourette syndrome Maternal Grandfather    Emphysema Paternal Grandmother    Heart disease Paternal Grandfather    Tourette syndrome Maternal Aunt    Alcohol abuse Maternal Aunt    Alcohol abuse Maternal Uncle    Hyperlipidemia Paternal Aunt    Hashimoto's thyroiditis Paternal Aunt    Suicidality Neg  Hx    Urticaria Neg Hx    Angioedema Neg Hx     Allergies: Allergies  Allergen Reactions   Latex Swelling and Rash   Barley Grass Hives   Cranberry Juice Powder Other (See Comments)    Throat pain.   Naproxen Other (See Comments)    "Liver starts hurting" Diarrhea   Nickel     Skin peeling   Sulfa Antibiotics Hives and Swelling    Medications Prior to Admission  Medication Sig Dispense Refill Last Dose   acetaminophen (TYLENOL) 325 MG tablet Take 650 mg by mouth every 6 (six) hours as needed.   Past Week   cetirizine (ZYRTEC ALLERGY) 10 MG tablet Take 1 tablet (10 mg total) by mouth 2 (two) times daily. 60 tablet 3 05/14/2022   doxylamine, Sleep, (UNISOM) 25 MG tablet Take 25 mg by mouth at bedtime as needed for sleep.   05/14/2022   FOLIC ACID PO Take 1 tablet by mouth daily.   05/14/2022   magnesium 30 MG tablet Take 30 mg by mouth 2 (two) times daily.   05/14/2022   Prenatal Vit-Fe Fumarate-FA (PRENATAL MULTIVITAMIN) TABS tablet Take 1 tablet by mouth daily at 12 noon.   05/14/2022   promethazine (PHENERGAN) 12.5 MG tablet Take 12.5 mg by mouth every 8 (eight) hours as needed for nausea or vomiting.   Past Week     Review of Systems   All systems reviewed and negative except as stated in HPI  Blood pressure 110/70, pulse 71, temperature 98.6 F (37 C), temperature source Oral, resp. rate 16, height 5\' 6"  (1.676 m), weight 110.3 kg, last menstrual period 08/09/2021, SpO2 98 %. General appearance: alert, cooperative, and appears stated age Lungs: clear to auscultation bilaterally Heart: regular rate and rhythm Abdomen: soft, non-tender; bowel sounds normal Extremities: Homans sign is negative, no sign of DVT DTR's 2+ Presentation: cephalic Fetal monitoringBaseline: 130 bpm, Variability: Good {> 6 bpm), Accelerations: Reactive, and Decelerations: Absent Uterine activity: None Dilation: 1 Effacement (%): 80 Station: -3 Exam by:: Autry-Lott,DO   Prenatal labs: ABO, Rh:  --/--/B POS (01/15 2025) Antibody: NEG (01/15 2025) Rubella: <20.0 (08/02 1352) RPR: Non Reactive (10/25 0900)  HBsAg: NON REACTIVE (06/23 1346)  HIV: Non Reactive (11/10 0957)  GBS: Negative/-- (12/27 1549)  1 hr Glucola: normal (02/22/2022) Genetic screening: normal (10/21/2021) Anatomy 10/23/2021: normal (12/22/2021)  Prenatal Transfer Tool  Maternal Diabetes: No Genetic Screening: Normal Maternal Ultrasounds/Referrals: Normal Fetal Ultrasounds or other Referrals:  None Maternal Substance Abuse:  No Significant Maternal Medications:  None Significant Maternal Lab Results: Group B Strep negative  Results for orders placed or performed during the hospital encounter of 05/15/22 (from the past 24 hour(s))  Fern Test   Collection Time: 05/15/22  7:49 PM  Result Value Ref Range   POCT Fern Test Positive = ruptured amniotic membanes   CBC   Collection Time: 05/15/22  8:25 PM  Result Value Ref Range   WBC 11.3 (H) 4.0 - 10.5 K/uL   RBC 3.49 (L) 3.87 - 5.11 MIL/uL   Hemoglobin 10.8 (L) 12.0 - 15.0 g/dL   HCT 32.5 (L) 36.0 - 46.0 %   MCV 93.1 80.0 - 100.0 fL   MCH 30.9 26.0 - 34.0 pg   MCHC 33.2 30.0 - 36.0 g/dL   RDW 12.3 11.5 - 15.5 %   Platelets 245 150 - 400 K/uL   nRBC 0.0 0.0 - 0.2 %  Type and screen   Collection Time: 05/15/22  8:25 PM  Result Value Ref Range   ABO/RH(D) B POS    Antibody Screen NEG    Sample Expiration      05/18/2022,2359 Performed at [facility], [place]. [address], [place], [place] [address]     Patient Active Problem List   Diagnosis Date Noted   Post-dates pregnancy 05/15/2022   Rubella non-immune status, antepartum 12/07/2021   Major depressive disorder, single episode, in full remission ([place]) 11/02/2021   Supervision of other normal pregnancy, antepartum 10/11/2021   History of asthma 09/23/2020   Urticaria 08/24/2020   Other adverse food reactions, not elsewhere classified, subsequent encounter 08/24/2020   Other allergic rhinitis  08/24/2020   Exercise-induced asthma 10/27/2019   GAD (generalized anxiety disorder) 11/07/2018   Overweight (BMI 25.0-29.9) 06/03/2018   Bulimia 04/20/2015   ADD (attention deficit disorder) 01/15/2014   Tourette disease 10/18/2013    Assessment/Plan:  Cindy Jones is a 32 y.o. G2P0010 at [redacted]w[redacted]d here for PROM @ 1600.  #Labor: PROM ~6 hours. Not feeling any contractions. Favorable cervix. Foley balloon with 40cc saline placed. Would benefit from low dose Pitocin but patient wants to defer for 1-2 hours to see if labor will progress. #Pain: Epidural upon request #FWB: Cat 1 #ID:  GBS neg #MOF: Breast feeding #MOC: Condoms #Circ:  No  Colletta Maryland, MD  05/15/2022, 9:46 PM  GME ATTESTATION:  I saw and evaluated the patient. I agree with the findings and the plan of care as documented in the resident's note. I have made changes to documentation as necessary.  Gerlene Fee, DO OB Fellow, [place] for [place] 05/15/2022, 10:02 PM

## 2022-05-15 NOTE — MAU Provider Note (Addendum)
Event Date/Time  First Provider Initiated Contact with Patient 05/15/22 1938     S: Ms. Cindy Jones is a 32 y.o. G2P0010 at [redacted]w[redacted]d  who presents to MAU today complaining of leaking of fluid since 1600. She denies vaginal bleeding. She denies contractions. She reports normal fetal movement.    O: BP 107/75   Pulse 96   Temp 98.6 F (37 C) (Oral)   Resp 16   Ht 5\' 6"  (1.676 m)   Wt 110.3 kg   LMP 08/09/2021 (Exact Date)   SpO2 98%   BMI 39.25 kg/m  GENERAL: Well-developed, well-nourished female in no acute distress.  HEAD: Normocephalic, atraumatic.  CHEST: Normal effort of breathing, regular heart rate ABDOMEN: Soft, nontender, gravid PELVIC: Normal external female genitalia. Vagina is pink and rugated. Cervix with normal contour, no lesions. Normal discharge.  + large amount of pooling.   Cervical exam: deferred   Fetal Monitoring: Baseline: 130 bpm Variability: Moderate  Accelerations: 15x15 Decelerations: None Contractions: None  No results found for this or any previous visit (from the past 24 hour(s)).   A: SIUP at [redacted]w[redacted]d  SROM  P: Admit to labor  GBS negative Spoke to Dr. Kizzie Furnish, Artist Pais, NP 05/15/2022 7:49 PM    Vertex presentation confirmed via bedside US  Nur Krasinski Isaias Sakai) Rollene Rotunda, MSN, [place] for [place]  05/15/22 8:29 PM

## 2022-05-16 ENCOUNTER — Inpatient Hospital Stay (HOSPITAL_COMMUNITY): Payer: 59 | Admitting: Anesthesiology

## 2022-05-16 ENCOUNTER — Encounter: Payer: 59 | Admitting: Family Medicine

## 2022-05-16 ENCOUNTER — Other Ambulatory Visit: Payer: Self-pay

## 2022-05-16 ENCOUNTER — Encounter (HOSPITAL_COMMUNITY): Payer: Self-pay | Admitting: Family Medicine

## 2022-05-16 DIAGNOSIS — O480 Post-term pregnancy: Secondary | ICD-10-CM

## 2022-05-16 DIAGNOSIS — Z3A40 40 weeks gestation of pregnancy: Secondary | ICD-10-CM

## 2022-05-16 DIAGNOSIS — O99214 Obesity complicating childbirth: Secondary | ICD-10-CM

## 2022-05-16 DIAGNOSIS — O99344 Other mental disorders complicating childbirth: Secondary | ICD-10-CM

## 2022-05-16 DIAGNOSIS — O4202 Full-term premature rupture of membranes, onset of labor within 24 hours of rupture: Secondary | ICD-10-CM

## 2022-05-16 LAB — RPR: RPR Ser Ql: NONREACTIVE

## 2022-05-16 MED ORDER — ONDANSETRON HCL 4 MG PO TABS: 4.0000 mg | ORAL_TABLET | ORAL | Status: DC | PRN

## 2022-05-16 MED ORDER — SODIUM CHLORIDE 0.9% FLUSH: 3.0000 mL | INTRAVENOUS | Status: DC | PRN

## 2022-05-16 MED ORDER — ZOLPIDEM TARTRATE 5 MG PO TABS
5.0000 mg | ORAL_TABLET | Freq: Every evening | ORAL | Status: DC | PRN
  Administered 2022-05-18: 5 mg via ORAL
  Filled 2022-05-16: qty 1

## 2022-05-16 MED ORDER — IBUPROFEN 600 MG PO TABS
600.0000 mg | ORAL_TABLET | Freq: Four times a day (QID) | ORAL | Status: DC
  Administered 2022-05-17 (×3): 600 mg via ORAL
  Filled 2022-05-16 (×3): qty 1

## 2022-05-16 MED ORDER — FLEET ENEMA 7-19 GM/118ML RE ENEM: 1.0000 | ENEMA | Freq: Every day | RECTAL | Status: DC | PRN

## 2022-05-16 MED ORDER — DIPHENHYDRAMINE HCL 50 MG/ML IJ SOLN: 12.5000 mg | INTRAMUSCULAR | Status: DC | PRN

## 2022-05-16 MED ORDER — MEASLES, MUMPS & RUBELLA VAC IJ SOLR: 0.5000 mL | Freq: Once | INTRAMUSCULAR | Status: DC

## 2022-05-16 MED ORDER — WITCH HAZEL-GLYCERIN EX PADS: 1.0000 | MEDICATED_PAD | CUTANEOUS | Status: DC | PRN

## 2022-05-16 MED ORDER — SODIUM CHLORIDE 0.9% FLUSH
3.0000 mL | Freq: Two times a day (BID) | INTRAVENOUS | Status: DC
  Administered 2022-05-17: 3 mL via INTRAVENOUS

## 2022-05-16 MED ORDER — SENNOSIDES-DOCUSATE SODIUM 8.6-50 MG PO TABS
2.0000 | ORAL_TABLET | Freq: Every day | ORAL | Status: DC
  Administered 2022-05-17: 2 via ORAL
  Filled 2022-05-16: qty 2

## 2022-05-16 MED ORDER — DIPHENHYDRAMINE HCL 25 MG PO CAPS: 25.0000 mg | ORAL_CAPSULE | Freq: Four times a day (QID) | ORAL | Status: DC | PRN

## 2022-05-16 MED ORDER — SODIUM CHLORIDE 0.9 % IV SOLN
12.5000 mg | Freq: Four times a day (QID) | INTRAVENOUS | Status: DC | PRN
  Administered 2022-05-16: 12.5 mg via INTRAVENOUS
  Filled 2022-05-16: qty 12.5

## 2022-05-16 MED ORDER — SIMETHICONE 80 MG PO CHEW: 80.0000 mg | CHEWABLE_TABLET | ORAL | Status: DC | PRN

## 2022-05-16 MED ORDER — FENTANYL-BUPIVACAINE-NACL 0.5-0.125-0.9 MG/250ML-% EP SOLN
12.0000 mL/h | EPIDURAL | Status: DC | PRN
  Filled 2022-05-16: qty 250

## 2022-05-16 MED ORDER — DIBUCAINE (PERIANAL) 1 % EX OINT: 1.0000 | TOPICAL_OINTMENT | CUTANEOUS | Status: DC | PRN

## 2022-05-16 MED ORDER — LACTATED RINGERS IV SOLN
500.0000 mL | Freq: Once | INTRAVENOUS | Status: AC
  Administered 2022-05-16: 500 mL via INTRAVENOUS

## 2022-05-16 MED ORDER — SODIUM CHLORIDE 0.9 % IV SOLN: 250.0000 mL | INTRAVENOUS | Status: DC | PRN

## 2022-05-16 MED ORDER — COCONUT OIL OIL: 1.0000 | TOPICAL_OIL | Status: DC | PRN

## 2022-05-16 MED ORDER — EPHEDRINE 5 MG/ML INJ: 10.0000 mg | INTRAVENOUS | Status: DC | PRN

## 2022-05-16 MED ORDER — BISACODYL 10 MG RE SUPP: 10.0000 mg | Freq: Every day | RECTAL | Status: DC | PRN

## 2022-05-16 MED ORDER — EPHEDRINE 5 MG/ML INJ
10.0000 mg | INTRAVENOUS | Status: DC | PRN
  Filled 2022-05-16: qty 5

## 2022-05-16 MED ORDER — TETANUS-DIPHTH-ACELL PERTUSSIS 5-2.5-18.5 LF-MCG/0.5 IM SUSY: 0.5000 mL | PREFILLED_SYRINGE | Freq: Once | INTRAMUSCULAR | Status: DC

## 2022-05-16 MED ORDER — ACETAMINOPHEN 325 MG PO TABS
650.0000 mg | ORAL_TABLET | ORAL | Status: DC | PRN
  Administered 2022-05-17: 650 mg via ORAL
  Filled 2022-05-16: qty 2

## 2022-05-16 MED ORDER — BENZOCAINE-MENTHOL 20-0.5 % EX AERO: 1.0000 | INHALATION_SPRAY | CUTANEOUS | Status: DC | PRN

## 2022-05-16 MED ORDER — PHENYLEPHRINE 80 MCG/ML (10ML) SYRINGE FOR IV PUSH (FOR BLOOD PRESSURE SUPPORT)
80.0000 ug | PREFILLED_SYRINGE | INTRAVENOUS | Status: DC | PRN
  Filled 2022-05-16: qty 10

## 2022-05-16 MED ORDER — ONDANSETRON HCL 4 MG/2ML IJ SOLN: 4.0000 mg | INTRAMUSCULAR | Status: DC | PRN

## 2022-05-16 MED ORDER — LIDOCAINE HCL (PF) 1 % IJ SOLN
INTRAMUSCULAR | Status: DC | PRN
  Administered 2022-05-16: 2 mL via EPIDURAL
  Administered 2022-05-16: 10 mL via EPIDURAL

## 2022-05-16 MED ORDER — HYDROXYZINE HCL 50 MG/ML IM SOLN
25.0000 mg | Freq: Four times a day (QID) | INTRAMUSCULAR | Status: DC | PRN
  Filled 2022-05-16: qty 1

## 2022-05-16 MED ORDER — FENTANYL-BUPIVACAINE-NACL 0.5-0.125-0.9 MG/250ML-% EP SOLN
EPIDURAL | Status: DC | PRN
  Administered 2022-05-16: 12 mL/h via EPIDURAL

## 2022-05-16 MED ORDER — PHENYLEPHRINE 80 MCG/ML (10ML) SYRINGE FOR IV PUSH (FOR BLOOD PRESSURE SUPPORT): 80.0000 ug | PREFILLED_SYRINGE | INTRAVENOUS | Status: DC | PRN

## 2022-05-16 MED ORDER — PRENATAL MULTIVITAMIN CH
1.0000 | ORAL_TABLET | Freq: Every day | ORAL | Status: DC
  Administered 2022-05-17: 1 via ORAL
  Filled 2022-05-16: qty 1

## 2022-05-16 NOTE — Progress Notes (Addendum)
Cindy Jones is a 32 y.o. G2P0010 at [redacted]w[redacted]d by LMP admitted for PROM at term  Subjective: Pt is feeling well besides being exhausted.   Objective: BP 113/70   Pulse 64   Temp 97.8 F (36.6 C) (Oral)   Resp 17   Ht 5\' 6"  (1.676 m)   Wt 110.3 kg   LMP 08/09/2021 (Exact Date)   SpO2 99%   BMI 39.25 kg/m  No intake/output data recorded. No intake/output data recorded.  FHT:  FHR: 120 bpm, variability: moderate,  accelerations:  Present,  decelerations:  Absent UC:   regular, every 4 minutes SVE:   5/90/-2 by Drake Leach, CNM  Labs: Lab Results  Component Value Date   WBC 11.3 (H) 05/15/2022   HGB 10.8 (L) 05/15/2022   HCT 32.5 (L) 05/15/2022   MCV 93.1 05/15/2022   PLT 245 05/15/2022    Assessment / Plan: Augmentation of labor, progressing well IUPC was placed and consent performed to better assess contractions  Labor: latent, making progress, AROM'd forebag Preeclampsia:  no signs or symptoms of toxicity Fetal Wellbeing:  Category I Pain Control:  Epidural I/D:  n/a Anticipated MOD:  NSVD  Abram Sander, MD 05/16/2022, 2:54 PM  Midwife attestation I agree with the documentation in the resident's note.  In addition: Prolonged ROM >24 hrs, no signs of Infection IUPC placed, Continue Pitocin for adequate ctx Anticipate SVD   Julianne Handler, CNM 4:56 PM

## 2022-05-16 NOTE — Progress Notes (Signed)
Labor Progress Note  Cindy Jones is a 32 y.o. G2P0010 at [redacted]w[redacted]d presented for PROM @ 1600.  S: No acute concerns.   O:  BP 108/70   Pulse 62   Temp 97.9 F (36.6 C) (Oral)   Resp 18   Ht 5\' 6"  (1.676 m)   Wt 110.3 kg   LMP 08/09/2021 (Exact Date)   SpO2 100%   BMI 39.25 kg/m  EFM: 120 bpm/Moderate variability/ 15x15 accels/ None decels  CVE: Dilation: 3 Effacement (%): 90 Station: -2 Presentation: Vertex (bedside ultrasound) Exam by:: Autry-Lott, DO   A&P: 32 y.o. G2P0010 [redacted]w[redacted]d  here for PROM as above  #Labor: Progressing well. Continue on low dose Pitocin. Evaluate for IUPC if unchanged at next exam.  #Pain: PO/IV pain meds #FWB: CAT 1 #GBS negative  Shatiqua Heroux Autry-Lott, DO 05/16/22 7:04 AM

## 2022-05-16 NOTE — Progress Notes (Signed)
Labor Progress Note Cindy Jones is a 32 y.o. G2P0010 at [redacted]w[redacted]d presented for PROM at term  S:  Feeling some ctx but better since recent dose of analgesic.  O:  BP 100/67 (BP Location: Right Arm)   Pulse 63   Temp 97.8 F (36.6 C) (Oral)   Resp 17   Ht 5\' 6"  (1.676 m)   Wt 110.3 kg   LMP 08/09/2021 (Exact Date)   SpO2 100%   BMI 39.25 kg/m  EFM: baseline 120 bpm/ mod variability/ + accels/ no decels  Toco/IUPC: 2-3 SVE: Dilation: 3 Effacement (%): 90 Station: -2 Presentation: Vertex (bedside ultrasound) Exam by:: Autry-Lott, DO Pitocin: 16 mu/min  A/P: 32 y.o. G2P0010 [redacted]w[redacted]d  1. Labor: latent 2. FWB: Cat I 3. Pain: analgesia/anesthesia/NO prn   Continue Pitocin. Anticipate labor progress and SVD.  Julianne Handler, CNM 10:22 AM

## 2022-05-16 NOTE — Discharge Summary (Signed)
Postpartum Discharge Summary     Patient Name: EUGINA ROW DOB: 08-01-1990 MRN: [id number]  Date of admission: 05/15/2022 Delivery date:05/16/2022  Delivering provider: Wells Guiles R  Date of discharge: 05/18/2022  Admitting diagnosis: Post-dates pregnancy [O48.0] Intrauterine pregnancy: [redacted]w[redacted]d     Secondary diagnosis:  Principal Problem:   Post-dates pregnancy  Additional problems: None    Discharge diagnosis: Term Pregnancy Delivered                                              Post partum procedures: None Augmentation: Pitocin, IP Foley, and AROM forebag Complications: PZW>25 hours  Hospital course: Induction of Labor With Vaginal Delivery   32 y.o. yo G2P0010 at [redacted]w[redacted]d was admitted to the hospital 05/15/2022 for induction of labor.  Indication for induction: PROM.  Patient had an labor course complicated bynothing Membrane Rupture Time/Date: 4:00 PM ,05/15/2022   Delivery Method:Vaginal, Spontaneous  Episiotomy: None  Lacerations:    Details of delivery can be found in separate delivery note.  Patient had a postpartum course was uncomplicated. Patient is discharged home 05/18/22.  Newborn Data: Birth date:05/16/2022  Birth time:9:23 PM  Gender:Female  Living status:Living  Apgars:8 ,9  Weight:   Magnesium Sulfate received: No BMZ received: No Rhophylac:N/A MMR:No T-DaP:Given prenatally Flu: Given prenatally Transfusion:No  Physical exam  Vitals:   05/16/22 1932 05/16/22 2002 05/16/22 2031 05/16/22 2126  BP: (!) 114/53 100/66 107/66 (!) 139/90  Pulse: 80 73 83 80  Resp:      Temp:   98.6 F (37 C)   TempSrc:      SpO2:      Weight:      Height:       General: alert, cooperative, and no distress Lochia: appropriate Uterine Fundus: firm Incision: N/A DVT Evaluation: No evidence of DVT seen on physical exam. No significant calf/ankle edema. Labs: Lab Results  Component Value Date   WBC 11.3 (H) 05/15/2022   HGB 10.8 (L) 05/15/2022   HCT 32.5  (L) 05/15/2022   MCV 93.1 05/15/2022   PLT 245 05/15/2022      Latest Ref Rng & Units 10/15/2021   12:56 PM  CMP  Glucose 70 - 99 mg/dL 74   BUN 6 - 20 mg/dL 7   Creatinine 0.44 - 1.00 mg/dL 0.57   Sodium 135 - 145 mmol/L 138   Potassium 3.5 - 5.1 mmol/L 4.2   Chloride 98 - 111 mmol/L 109   CO2 22 - 32 mmol/L 20   Calcium 8.9 - 10.3 mg/dL 8.5   Total Protein 6.5 - 8.1 g/dL 6.2   Total Bilirubin 0.3 - 1.2 mg/dL 0.6   Alkaline Phos 38 - 126 U/L 30   AST 15 - 41 U/L 18   ALT 0 - 44 U/L 15    Edinburgh Score:     No data to display           After visit meds:  Allergies as of 05/18/2022       Reactions   Latex Swelling, Rash   Barley Grass Hives   Cranberry Juice Powder Other (See Comments)   Throat pain.   Naproxen Other (See Comments)   "Liver starts hurting" Diarrhea   Nickel    Skin peeling   Sulfa Antibiotics Hives, Swelling        Medication List  STOP taking these medications    acetaminophen 325 MG tablet Commonly known as: TYLENOL   cetirizine 10 MG tablet Commonly known as: ZyrTEC Allergy   doxylamine (Sleep) 25 MG tablet Commonly known as: UNISOM   FOLIC ACID PO   magnesium 30 MG tablet   promethazine 12.5 MG tablet Commonly known as: PHENERGAN       TAKE these medications    ibuprofen 600 MG tablet Commonly known as: ADVIL Take 1 tablet (600 mg total) by mouth every 6 (six) hours.   prenatal multivitamin Tabs tablet Take 1 tablet by mouth daily at 12 noon.         Discharge home in stable condition Infant Feeding: Breast Infant Disposition:home with mother Discharge instruction: per After Visit Summary and Postpartum booklet. Activity: Advance as tolerated. Pelvic rest for 6 weeks.  Diet: routine diet Future Appointments: Future Appointments  Date Time Provider [place]  05/25/2022 11:30 AM Charlcie Cradle, MD [facility] None  05/30/2022  8:20 AM Harriet Masson, Godfrey Pick, DO [facility] None   Follow up  Visit: Roma Schanz, CNM  P [facility] Please schedule this patient for PP visit in: 4-6wks, mood check 2wks Low risk pregnancy complicated by: nothing Delivery mode:  SVD Anticipated Birth Control:  Condoms PP Procedures needed: pap, mood check Schedule Integrated [place] visit: yes Provider: Any provider  05/16/2022 Roma Schanz, CNM   Maryann Conners MSN, CNM Advanced Practice Provider, [facility] for [place] 05/18/2022 10:33 AM

## 2022-05-16 NOTE — Anesthesia Preprocedure Evaluation (Signed)
Anesthesia Evaluation  Patient identified by MRN, date of birth, ID band Patient awake    Reviewed: Allergy & Precautions, Patient's Chart, lab work & pertinent test results  Airway Mallampati: II  TM Distance: >3 FB Neck ROM: Full    Dental no notable dental hx.    Pulmonary asthma    Pulmonary exam normal breath sounds clear to auscultation       Cardiovascular Normal cardiovascular exam Rhythm:Regular Rate:Normal  POTS- syncopized w/ PIV placement earlier   Neuro/Psych  Headaches PSYCHIATRIC DISORDERS Anxiety Depression       GI/Hepatic Neg liver ROS,GERD  Controlled,,  Endo/Other  Obesity BMI 39  Renal/GU negative Renal ROS  negative genitourinary   Musculoskeletal negative musculoskeletal ROS (+)    Abdominal  (+) + obese  Peds negative pediatric ROS (+)  Hematology  (+) Blood dyscrasia, anemia Hb 10.8, plt 245   Anesthesia Other Findings   Reproductive/Obstetrics (+) Pregnancy                             Anesthesia Physical Anesthesia Plan  ASA: 3  Anesthesia Plan: Epidural   Post-op Pain Management:    Induction:   PONV Risk Score and Plan: 2  Airway Management Planned: Natural Airway  Additional Equipment: None  Intra-op Plan:   Post-operative Plan:   Informed Consent: I have reviewed the patients History and Physical, chart, labs and discussed the procedure including the risks, benefits and alternatives for the proposed anesthesia with the patient or authorized representative who has indicated his/her understanding and acceptance.       Plan Discussed with:   Anesthesia Plan Comments:        Anesthesia Quick Evaluation

## 2022-05-16 NOTE — Progress Notes (Signed)
Labor Progress Note  Cindy Jones is a 32 y.o. G2P0010 at [redacted]w[redacted]d presented for PROM @ 1600.  S: Foley balloon still in place. Having some nausea.  O:  BP 106/67   Pulse 68   Temp 97.8 F (36.6 C) (Oral)   Resp 18   Ht 5\' 6"  (1.676 m)   Wt 110.3 kg   LMP 08/09/2021 (Exact Date)   SpO2 100%   BMI 39.25 kg/m  EFM: 120 bpm/Moderate variability/ 15x15 accels/ None decels  CVE: Dilation: 1 Effacement (%): 80 Station: -3 Presentation: Vertex (bedside ultrasound) Exam by:: Autry-Lott,DO   A&P: 32 y.o. G2P0010 [redacted]w[redacted]d  here for PROM as above  #Labor: Progressing well. Foley balloon in place. Continue on low dose Pitocin #Pain: PO/IV pain meds #FWB: CAT 1 #GBS negative  Cindy Maryland, MD 05/16/22 2:30 AM

## 2022-05-16 NOTE — Anesthesia Procedure Notes (Signed)
Epidural Patient location during procedure: OB Start time: 05/16/2022 1:26 PM End time: 05/16/2022 1:35 PM  Staffing Anesthesiologist: Pervis Hocking, DO Performed: anesthesiologist   Preanesthetic Checklist Completed: patient identified, IV checked, risks and benefits discussed, monitors and equipment checked, pre-op evaluation and timeout performed  Epidural Patient position: sitting Prep: DuraPrep and site prepped and draped Patient monitoring: continuous pulse ox, blood pressure, heart rate and cardiac monitor Approach: midline Location: L3-L4 Injection technique: LOR air  Needle:  Needle type: Tuohy  Needle gauge: 17 G Needle length: 9 cm Needle insertion depth: 7 cm Catheter type: closed end flexible Catheter size: 19 Gauge Catheter at skin depth: 12 cm Test dose: negative  Assessment Sensory level: T8 Events: blood not aspirated, no cerebrospinal fluid, injection not painful, no injection resistance, no paresthesia and negative IV test  Additional Notes Patient identified. Risks/Benefits/Options discussed with patient including but not limited to bleeding, infection, nerve damage, paralysis, failed block, incomplete pain control, headache, blood pressure changes, nausea, vomiting, reactions to medication both or allergic, itching and postpartum back pain. Confirmed with bedside nurse the patient's most recent platelet count. Confirmed with patient that they are not currently taking any anticoagulation, have any bleeding history or any family history of bleeding disorders. Patient expressed understanding and wished to proceed. All questions were answered. Sterile technique was used throughout the entire procedure. Please see nursing notes for vital signs. Test dose was given through epidural catheter and negative prior to continuing to dose epidural or start infusion. Warning signs of high block given to the patient including shortness of breath, tingling/numbness in  hands, complete motor block, or any concerning symptoms with instructions to call for help. Patient was given instructions on fall risk and not to get out of bed. All questions and concerns addressed with instructions to call with any issues or inadequate analgesia.  Reason for block:procedure for pain

## 2022-05-16 NOTE — Progress Notes (Signed)
Patient ID: Cindy Jones, female   DOB: 10-Dec-1990, 32 y.o.   MRN: [id number] Cindy Jones is a 32 y.o. G2P0010 at [redacted]w[redacted]d admitted for PROM  Subjective: comfortable with epidural, feeling pressure, and nausea  Objective: BP (!) 99/57   Pulse 70   Temp 98.1 F (36.7 C) (Oral)   Resp 17   Ht 5\' 6"  (1.676 m)   Wt 110.3 kg   LMP 08/09/2021 (Exact Date)   SpO2 99%   BMI 39.25 kg/m  No intake/output data recorded.  FHR baseline 120 bpm, Variability: moderate, Accelerations:present, Decelerations:  Present  earlies Toco: q 2 mins, MVUs adequate  SVE:   Dilation: 9 Effacement (%): 90 Station: Plus 1 Exam by:: Knute Neu CNM  Pitocin @ 22 mu/min  Labs: Lab Results  Component Value Date   WBC 11.3 (H) 05/15/2022   HGB 10.8 (L) 05/15/2022   HCT 32.5 (L) 05/15/2022   MCV 93.1 05/15/2022   PLT 245 05/15/2022    Assessment / Plan: IOL d/t PROM, making change on pitocin  Labor: active Fetal Wellbeing:  Category I Pain Control:  epidural Pre-eclampsia: N/A I/D:  GBS neg Anticipated MOD: NSVB  Roma Schanz CNM, WHNP-BC 05/16/2022, 6:27 PM

## 2022-05-16 NOTE — Progress Notes (Signed)
Patient ID: Cindy Jones, female   DOB: January 18, 1991, 32 y.o.   MRN: [id number] Cindy Jones is a 32 y.o. G2P0010 at [redacted]w[redacted]d admitted for PROM  Subjective: feeling pressure, nausea a little better after phenergan  Objective: BP 100/66   Pulse 73   Temp 98.1 F (36.7 C) (Oral)   Resp 18   Ht 5\' 6"  (1.676 m)   Wt 110.3 kg   LMP 08/09/2021 (Exact Date)   SpO2 99%   BMI 39.25 kg/m  No intake/output data recorded.  FHR baseline 125 bpm, Variability: moderate, Accelerations:present, Decelerations:  Absent Toco: q 2 mins, MVUs adequate  SVE:   Dilation: 10 Effacement (%): 100 Station: Plus 2 Exam by:: Knute Neu, CNM  Pitocin @ 22 mu/min  Labs: Lab Results  Component Value Date   WBC 11.3 (H) 05/15/2022   HGB 10.8 (L) 05/15/2022   HCT 32.5 (L) 05/15/2022   MCV 93.1 05/15/2022   PLT 245 05/15/2022    Assessment / Plan: IOL d/t PROM, complete/+2, will start pushing  Labor: active Fetal Wellbeing:  Category I Pain Control:  epidural Pre-eclampsia: N/A I/D:  GBS neg Anticipated MOD: NSVB  Roma Schanz CNM, WHNP-BC 05/16/2022, 8:37 PM

## 2022-05-17 ENCOUNTER — Inpatient Hospital Stay (HOSPITAL_COMMUNITY): Admission: RE | Admit: 2022-05-17 | Payer: 59 | Source: Home / Self Care | Admitting: Family Medicine

## 2022-05-17 ENCOUNTER — Inpatient Hospital Stay (HOSPITAL_COMMUNITY): Payer: 59

## 2022-05-17 MED ORDER — KETOROLAC TROMETHAMINE 30 MG/ML IJ SOLN
15.0000 mg | Freq: Four times a day (QID) | INTRAMUSCULAR | Status: AC
  Administered 2022-05-17 (×3): 15 mg via INTRAVENOUS
  Filled 2022-05-17 (×3): qty 1

## 2022-05-17 MED ORDER — IBUPROFEN 600 MG PO TABS
600.0000 mg | ORAL_TABLET | Freq: Four times a day (QID) | ORAL | Status: DC
  Administered 2022-05-18: 600 mg via ORAL
  Filled 2022-05-17 (×2): qty 1

## 2022-05-17 MED ORDER — OXYCODONE HCL 5 MG PO TABS
5.0000 mg | ORAL_TABLET | Freq: Once | ORAL | Status: AC
  Administered 2022-05-17: 5 mg via ORAL
  Filled 2022-05-17: qty 1

## 2022-05-17 NOTE — Lactation Note (Signed)
This note was copied from a baby's chart. Lactation Consultation Note  Patient Name: Boy Larkyn Greenberger [id number] Date: 05/17/2022   Age:32 hours Mom stated she had given the baby a bottle because he hadn't eaten and he was hungry. Mom stated she was exhausted and needed to rest. Mom stated she doesn't have anything but I can check. LC hand expressed and massaged breast. No colostrum noted at this time. Explained to mom that doesn't mean that she doesn't have anything. Explained consistency of colostrum. Noted mom having short shafted nipples.  LC feels that shells and pump would benefit mom. Mom is to tired right now and wants to go to sleep. Mom was sleeping when baby woke her up screaming and it upset mom. Encouraged mom to take deep breaths and relax and get rest. Mom informed LC that dad if [employer] Employee and mom needs her DEBP-employee. Pump sheet given for mom to look over later today after resting. Encouraged mom to call for assistance for next feeding.   Maternal Data    Feeding    LATCH Score Latch: Repeated attempts needed to sustain latch, nipple held in mouth throughout feeding, stimulation needed to elicit sucking reflex.  Audible Swallowing: A few with stimulation  Type of Nipple: Everted at rest and after stimulation  Comfort (Breast/Nipple): Soft / non-tender  Hold (Positioning): Full assist, staff holds infant at breast  [facility] Score: 6   Lactation Tools Discussed/Used    Interventions    Discharge    Consult Status      Deshone Lyssy, Elta Guadeloupe 05/17/2022, 2:05 AM

## 2022-05-17 NOTE — Anesthesia Postprocedure Evaluation (Signed)
Anesthesia Post Note  Patient: Cindy Jones  Procedure(s) Performed: AN AD [place]     Patient location during evaluation: Mother Baby Anesthesia Type: Epidural Level of consciousness: awake, awake and alert, oriented and patient cooperative Pain management: satisfactory to patient Vital Signs Assessment: post-procedure vital signs reviewed and stable Respiratory status: spontaneous breathing and nonlabored ventilation Cardiovascular status: blood pressure returned to baseline Postop Assessment: no headache, epidural receding, able to ambulate and adequate PO intake Anesthetic complications: no   No notable events documented.  Last Vitals:  Vitals:   05/17/22 0440 05/17/22 0830  BP: 97/69 (!) 105/59  Pulse: 70 63  Resp: 18 18  Temp: 36.8 C 36.9 C  SpO2:  100%    Last Pain:  Vitals:   05/17/22 1020  TempSrc:   PainSc: 0-No pain   Pain Goal:                   Bush Murdoch

## 2022-05-17 NOTE — Plan of Care (Signed)
  Problem: Education: Goal: Knowledge of Childbirth will improve Outcome: Progressing Goal: Ability to make informed decisions regarding treatment and plan of care will improve Outcome: Progressing Goal: Ability to state and carry out methods to decrease the pain will improve Outcome: Progressing Goal: Individualized Educational Video(s) Outcome: Progressing   Problem: Coping: Goal: Ability to verbalize concerns and feelings about labor and delivery will improve Outcome: Progressing   Problem: Life Cycle: Goal: Ability to make normal progression through stages of labor will improve Outcome: Progressing Goal: Ability to effectively push during vaginal delivery will improve Outcome: Progressing   Problem: Role Relationship: Goal: Will demonstrate positive interactions with the child Outcome: Progressing   Problem: Safety: Goal: Risk of complications during labor and delivery will decrease Outcome: Progressing   Problem: Pain Management: Goal: Relief or control of pain from uterine contractions will improve Outcome: Progressing   Problem: Education: Goal: Knowledge of General Education information will improve Description: Including pain rating scale, medication(s)/side effects and non-pharmacologic comfort measures Outcome: Progressing   Problem: Health Behavior/Discharge Planning: Goal: Ability to manage health-related needs will improve Outcome: Progressing   Problem: Clinical Measurements: Goal: Ability to maintain clinical measurements within normal limits will improve Outcome: Progressing Goal: Will remain free from infection Outcome: Progressing Goal: Diagnostic test results will improve Outcome: Progressing Goal: Respiratory complications will improve Outcome: Progressing Goal: Cardiovascular complication will be avoided Outcome: Progressing   Problem: Activity: Goal: Risk for activity intolerance will decrease Outcome: Progressing   Problem:  Nutrition: Goal: Adequate nutrition will be maintained Outcome: Progressing   Problem: Coping: Goal: Level of anxiety will decrease Outcome: Progressing   Problem: Elimination: Goal: Will not experience complications related to bowel motility Outcome: Progressing Goal: Will not experience complications related to urinary retention Outcome: Progressing   Problem: Pain Managment: Goal: General experience of comfort will improve Outcome: Progressing   Problem: Safety: Goal: Ability to remain free from injury will improve Outcome: Progressing   Problem: Skin Integrity: Goal: Risk for impaired skin integrity will decrease Outcome: Progressing   Problem: Education: Goal: Knowledge of condition will improve Outcome: Progressing Goal: Individualized Educational Video(s) Outcome: Progressing Goal: Individualized Newborn Educational Video(s) Outcome: Progressing   Problem: Activity: Goal: Will verbalize the importance of balancing activity with adequate rest periods Outcome: Progressing Goal: Ability to tolerate increased activity will improve Outcome: Progressing   Problem: Coping: Goal: Ability to identify and utilize available resources and services will improve Outcome: Progressing   Problem: Life Cycle: Goal: Chance of risk for complications during the postpartum period will decrease Outcome: Progressing   Problem: Role Relationship: Goal: Ability to demonstrate positive interaction with newborn will improve Outcome: Progressing   Problem: Skin Integrity: Goal: Demonstration of wound healing without infection will improve Outcome: Progressing   

## 2022-05-17 NOTE — Lactation Note (Signed)
This note was copied from a baby's chart. Lactation Consultation Note  Patient Name: Cindy Jones [id number] Date: 05/17/2022 Reason for consult: Follow-up assessment;Primapara;1st time breastfeeding;Term;Nipple pain/trauma Age:32 hours  LC in to visit with P50 Mom of term baby delivered vaginally. Baby is at a 5% weight loss and has voided and stooled.  Mom reports baby has been latching on and off the breast.  Baby was acting hungry and parents decided to supplement by bottle.  Offered to assist and assess baby at the breast as baby was in crib and showing feeding cues.  Bruising noted on right areola.  Baby opens his mouth widely, but pops on and off.  Mom needing a lot of guidance and is very receptive to instruction from [facility].  Unable to express any drops currently.  Mom had + breast changes in pregnancy.   Oral assessment, normal upper palate, posterior lingual frenulum noted, good movement of tongue noted.  On suck assessment, baby noted to be doing a little tongue thrusting.  Suck training reviewed.  LC tried cross cradle hold on both breasts and football holds.  Head of bed lowered and placed baby prone on Mom's chest.  Baby opens his mouth very widely with flanged lips.  Deep jaw extensions noted.  No swallows clearly identified.  Nipple slightly pinched after baby comes off the breast in any position.  Mom describes some pain felt.   Mom inquired about pumping.  Mom does want to supplement baby as he is cueing and acting hungry after several attempts to latch him.  He would sustain for 5 mins at the longest latch, but popping on and off for most.  Set up DEBP and assisted with first pumping using 21 mm flanges.  Mom expressed about 0.5 ml and finger fed the drops to baby.  Mom was relieved to see colostrum in the flanges.    Discussed donor breast milk and Mom would like this rather than formula as baby spit a lot of milk up.  Mom's nurse informed of this and instructions on paced bottle  feeding.  Mom encouraged to do STS with baby and offer the breast with feeding cues.  Mom will pump if baby remains hungry after breastfeeding and is supplemented.  Mom is interested in OP lactation F/U.  Message sent to [facility] RN IBCLC    Maternal Data Has patient been taught Hand Expression?: Yes Does the patient have breastfeeding experience prior to this delivery?: No  Feeding Mother's Current Feeding Choice: Breast Milk  LATCH Score Latch: Grasps breast easily, tongue down, lips flanged, rhythmical sucking. (on and off after a few attempts)  Audible Swallowing: None  Type of Nipple: Everted at rest and after stimulation  Comfort (Breast/Nipple): Filling, red/small blisters or bruises, mild/mod discomfort  Hold (Positioning): Assistance needed to correctly position infant at breast and maintain latch.  LATCH Score: 6   Lactation Tools Discussed/Used Tools: Pump;Flanges;Bottle Flange Size: 21 Breast pump type: Double-Electric Breast Pump Pump Education: Setup, frequency, and cleaning;Milk Storage Reason for Pumping: Support milk supply/infant being supplemented Pumping frequency: Mom encouraged to pump after breastfeeding if baby is supplemented Pumped volume: 0 mL (drops expressed)  Interventions Interventions: Breast feeding basics reviewed;Assisted with latch;Skin to skin;Breast massage;Breast compression;Adjust position;Support pillows;Position options;Expressed milk;DEBP;Education;Pace feeding;LC Services brochure  Discharge Pump:  (Mom to provide insurance cared later today for employee pump) [place] Program: No  Consult Status Consult Status: Follow-up Date: 05/18/22 Follow-up type: In-patient    Broadus John 05/17/2022, 9:21 AM

## 2022-05-17 NOTE — Progress Notes (Signed)
MOB was referred for history of depression/anxiety. * Referral screened out by Clinical Social Worker because none of the following criteria appear to apply: ~ History of anxiety/depression during this pregnancy, or of post-partum depression following prior delivery. ~ Diagnosis of anxiety and/or depression within last 3 years OR * MOB's symptoms currently being treated with medication and/or therapy. MOB currently visits with her psychiatrist rountinely and she has follow-up appointment with [facility].   Please contact the Clinical Social Worker if needs arise, by [facility] request, or if MOB scores greater than 9/yes to question 10 on [place] Postpartum Depression Screen.   Laurey Arrow, MSW, LCSW Clinical Social Work [phone]

## 2022-05-17 NOTE — Progress Notes (Signed)
POSTPARTUM PROGRESS NOTE  Post Partum Day 1  Subjective:  Cindy Jones is a 32 y.o. G2P1011 s/p SVD at [redacted]w[redacted]d.  She reports she is doing well, though has some pain when she moves, not controlled with meds. No acute events overnight. She denies any problems with ambulating, voiding or po intake. Denies nausea or vomiting.  Pain is not well controlled.  Lochia is adequate.  Objective: Blood pressure 97/69, pulse 70, temperature 98.2 F (36.8 C), temperature source Oral, resp. rate 18, height 5\' 6"  (1.676 m), weight 110.3 kg, last menstrual period 08/09/2021, SpO2 99 %, unknown if currently breastfeeding.  Physical Exam:  General: alert, cooperative and no distress Chest: no respiratory distress Heart:regular rate, distal pulses intact Uterine Fundus: firm, appropriately tender DVT Evaluation: No calf swelling or tenderness Extremities: mild edema Skin: warm, dry  Recent Labs    05/15/22 2025  HGB 10.8*  HCT 32.5*    Assessment/Plan: Cindy Jones is a 32 y.o. G2P1011 s/p SVD at [redacted]w[redacted]d   PPD#1 - Doing well  Routine postpartum care Adjusted meds for pain, Toradol for today, ibuprofen starting tomorrow. Contraception: Condoms Feeding: Breast Dispo: Plan for discharge tomorrow.   LOS: 2 days   Shelda Pal, DO OB Fellow  05/17/2022, 9:55 AM

## 2022-05-18 ENCOUNTER — Encounter: Payer: Self-pay | Admitting: Family Medicine

## 2022-05-18 ENCOUNTER — Other Ambulatory Visit (HOSPITAL_COMMUNITY): Payer: Self-pay

## 2022-05-18 ENCOUNTER — Other Ambulatory Visit: Payer: Self-pay

## 2022-05-18 LAB — BIRTH TISSUE RECOVERY COLLECTION (PLACENTA DONATION)

## 2022-05-18 MED ORDER — IBUPROFEN 600 MG PO TABS
600.0000 mg | ORAL_TABLET | Freq: Four times a day (QID) | ORAL | 0 refills | Status: DC
  Filled 2022-05-18: qty 60, 15d supply, fill #0

## 2022-05-18 NOTE — Lactation Note (Signed)
This note was copied from a baby's chart. Lactation Consultation Note  Patient Name: Cindy Jones [id number] Date: 05/18/2022 Reason for consult: Follow-up assessment;Primapara;1st time breastfeeding;Term Age:32 hours   P1: Term infant at 40+0 weeks Feeding preference: Breast/donor milk Weight loss: 7%  Mother requested latch assistance.  "Cindy Jones" was asleep in father's arms when I arrived.  Reviewed hand expression with mother and encouraged her to feed back any expressed drops to "Cindy Jones."  She has had some poor latches as is evidenced by bruising to her right areola.  Baby also has a posterior short lingual frenulum.  Reviewed how to latch deeply.  Upon my gloved finger he has a good rhythmic suck.  Assisted to latch in the football hold and observed him feeding for 13 minutes before he self released.  Placed him STS on mother's chest where he fell asleep.  Reviewed breast feeding basics and feeding plan for after discharge.  Parents may continue to supplement if he is not content after feedings.  Mother aware to always breast feed prior to giving formula supplementation.  Volume guidelines given.  Allowed time for questions.  Mother stated she feels comfortable after reviewing concepts.  Both parents receptive to teaching.  Father will assist with breast feeding.  Mother has received her employee [employer] pump.  She informed me that she has a lactation follow up visit scheduled after discharge.  Family has our op phone number for any questions/concerns after discharge.  RN updated.   Maternal Data    Feeding Mother's Current Feeding Choice: Breast Milk and Donor Milk  LATCH Score Latch: Grasps breast easily, tongue down, lips flanged, rhythmical sucking.  Audible Swallowing: A few with stimulation  Type of Nipple: Everted at rest and after stimulation  Comfort (Breast/Nipple): Filling, red/small blisters or bruises, mild/mod discomfort (Prior poor latch)  Hold (Positioning):  Assistance needed to correctly position infant at breast and maintain latch.  LATCH Score: 7   Lactation Tools Discussed/Used Tools: Coconut oil;Comfort gels Flange Size: 21 Breast pump type: Double-Electric Breast Pump;Manual Pump Education: Milk Storage (Reviewed) Reason for Pumping: Breast stimulation for supplementation  Interventions Interventions: Breast feeding basics reviewed;Assisted with latch;Skin to skin;Breast massage;Hand express;Breast compression;Hand pump;Coconut oil;Expressed milk;Position options;Support pillows;Adjust position;DEBP;Education  Discharge Discharge Education: Engorgement and breast care Pump: Employee Pump [employer] [facility] [employer])  Consult Status Consult Status: Complete Date: 05/18/22 Follow-up type: Call as needed    Cindy Jones R Cindy Jones 05/18/2022, 9:05 AM

## 2022-05-18 NOTE — Progress Notes (Signed)
Patient ID: NARIYA NEUMEYER, female   DOB: 05-04-1990, 32 y.o.   MRN: [id number]  [name]  Post Partum Day 2:S/P VD with repair of Vaginal Laceration  Subjective: Patient up ad lib, denies syncope or dizziness. Reports consuming regular diet without issues and denies N/V. Denies issues with urination and reports bleeding is " not too much."  Patient is breastfeeding and reports it is challenging, but they are making progress.  Desires condoms for postpartum contraception.  Pain is being appropriately managed with use of ibuprofen.  Objective: Vitals:   05/17/22 1230 05/17/22 1500 05/17/22 2102 05/18/22 0607  BP: (!) 108/58 101/62 119/75 104/65  Pulse: 69 63 69 65  Resp: 18 18 16 16   Temp: 98 F (36.7 C) 97.7 F (36.5 C) 97.7 F (36.5 C) 98.7 F (37.1 C)  TempSrc: Oral Oral Oral Oral  SpO2: 100% 100%    Weight:      Height:       Recent Labs    05/15/22 2025  HGB 10.8*  HCT 32.5*    Physical Exam:  General: alert, cooperative, and no distress Mood/Affect: Appropriate/Bright Lungs: clear to auscultation, no wheezes, rales or rhonchi, symmetric air entry.  Heart: normal rate and regular rhythm. Breast: not examined. Abdomen:  + bowel sounds, Soft, NT Uterine Fundus: firm, U/ Lochia: appropriate Laceration: Not evaluated Skin: Warm, Dry DVT Evaluation: No evidence of DVT seen on physical exam. No significant calf/ankle edema.  Assessment S/P Vaginal Delivery-Day 2 Normal [employer] BreastFeeding POTS  Plan: -Reviewed discharge planning including pain management, breastfeeding, and follow up. -Patient with H/O POTS and will follow up with K. Tobb, MD on 1/30. -PPV scheduled for 2/16.  Patient expresses some concern with this date and encouraged to reach out to [facility] if desiring early follow up. -Patient agreeable to ibuprofen for home use.  Will send in script to [facility].  -Increase iron in diet.  Information provided in discharge  paperwork. -L&D team to be updated on patient status.   Maryann Conners, MSN, CNM 05/18/2022, 9:35 AM

## 2022-05-18 NOTE — Plan of Care (Signed)
Problem: Education: Goal: Knowledge of Childbirth will improve 05/18/2022 1038 by Rodolph Bong, LPN Outcome: Adequate for Discharge 05/18/2022 0748 by Rodolph Bong, LPN Outcome: Progressing Goal: Ability to make informed decisions regarding treatment and plan of care will improve 05/18/2022 1038 by Rodolph Bong, LPN Outcome: Adequate for Discharge 05/18/2022 0748 by Rodolph Bong, LPN Outcome: Progressing Goal: Ability to state and carry out methods to decrease the pain will improve 05/18/2022 1038 by Rodolph Bong, LPN Outcome: Adequate for Discharge 05/18/2022 0748 by Rodolph Bong, LPN Outcome: Progressing Goal: Individualized Educational Video(s) 05/18/2022 1038 by Rodolph Bong, LPN Outcome: Adequate for Discharge 05/18/2022 0748 by Rodolph Bong, LPN Outcome: Progressing   Problem: Coping: Goal: Ability to verbalize concerns and feelings about labor and delivery will improve 05/18/2022 1038 by Rodolph Bong, LPN Outcome: Adequate for Discharge 05/18/2022 0748 by Rodolph Bong, LPN Outcome: Progressing   Problem: Life Cycle: Goal: Ability to make normal progression through stages of labor will improve 05/18/2022 1038 by Rodolph Bong, LPN Outcome: Adequate for Discharge 05/18/2022 0748 by Rodolph Bong, LPN Outcome: Progressing Goal: Ability to effectively push during vaginal delivery will improve 05/18/2022 1038 by Rodolph Bong, LPN Outcome: Adequate for Discharge 05/18/2022 0748 by Rodolph Bong, LPN Outcome: Progressing   Problem: Role Relationship: Goal: Will demonstrate positive interactions with the child 05/18/2022 1038 by Rodolph Bong, LPN Outcome: Adequate for Discharge 05/18/2022 0748 by Rodolph Bong, LPN Outcome: Progressing   Problem: Safety: Goal: Risk of complications during labor and delivery will decrease 05/18/2022 1038 by Rodolph Bong, LPN Outcome: Adequate for Discharge 05/18/2022 0748 by Rodolph Bong, LPN Outcome: Progressing    Problem: Pain Management: Goal: Relief or control of pain from uterine contractions will improve 05/18/2022 1038 by Rodolph Bong, LPN Outcome: Adequate for Discharge 05/18/2022 0748 by Rodolph Bong, LPN Outcome: Progressing   Problem: Education: Goal: Knowledge of General Education information will improve Description: Including pain rating scale, medication(s)/side effects and non-pharmacologic comfort measures 05/18/2022 1038 by Rodolph Bong, LPN Outcome: Adequate for Discharge 05/18/2022 0748 by Rodolph Bong, LPN Outcome: Progressing   Problem: Health Behavior/Discharge Planning: Goal: Ability to manage health-related needs will improve 05/18/2022 1038 by Rodolph Bong, LPN Outcome: Adequate for Discharge 05/18/2022 0748 by Rodolph Bong, LPN Outcome: Progressing   Problem: Clinical Measurements: Goal: Ability to maintain clinical measurements within normal limits will improve 05/18/2022 1038 by Rodolph Bong, LPN Outcome: Adequate for Discharge 05/18/2022 0748 by Rodolph Bong, LPN Outcome: Progressing Goal: Will remain free from infection 05/18/2022 1038 by Rodolph Bong, LPN Outcome: Adequate for Discharge 05/18/2022 0748 by Rodolph Bong, LPN Outcome: Progressing Goal: Diagnostic test results will improve 05/18/2022 1038 by Rodolph Bong, LPN Outcome: Adequate for Discharge 05/18/2022 0748 by Rodolph Bong, LPN Outcome: Progressing Goal: Respiratory complications will improve 05/18/2022 1038 by Rodolph Bong, LPN Outcome: Adequate for Discharge 05/18/2022 0748 by Rodolph Bong, LPN Outcome: Progressing Goal: Cardiovascular complication will be avoided 05/18/2022 1038 by Rodolph Bong, LPN Outcome: Adequate for Discharge 05/18/2022 0748 by Rodolph Bong, LPN Outcome: Progressing   Problem: Activity: Goal: Risk for activity intolerance will decrease 05/18/2022 1038 by Rodolph Bong, LPN Outcome: Adequate for Discharge 05/18/2022 0748 by Rodolph Bong,  LPN Outcome: Progressing   Problem: Nutrition: Goal: Adequate nutrition will be maintained 05/18/2022 1038 by Rodolph Bong, LPN Outcome: Adequate for Discharge 05/18/2022 0748 by  Serena Croissant A, LPN Outcome: Progressing   Problem: Coping: Goal: Level of anxiety will decrease 05/18/2022 1038 by Rodolph Bong, LPN Outcome: Adequate for Discharge 05/18/2022 0748 by Rodolph Bong, LPN Outcome: Progressing   Problem: Elimination: Goal: Will not experience complications related to bowel motility 05/18/2022 1038 by Rodolph Bong, LPN Outcome: Adequate for Discharge 05/18/2022 0748 by Rodolph Bong, LPN Outcome: Progressing Goal: Will not experience complications related to urinary retention 05/18/2022 1038 by Rodolph Bong, LPN Outcome: Adequate for Discharge 05/18/2022 0748 by Rodolph Bong, LPN Outcome: Progressing   Problem: Pain Managment: Goal: General experience of comfort will improve 05/18/2022 1038 by Rodolph Bong, LPN Outcome: Adequate for Discharge 05/18/2022 0748 by Rodolph Bong, LPN Outcome: Progressing   Problem: Safety: Goal: Ability to remain free from injury will improve 05/18/2022 1038 by Rodolph Bong, LPN Outcome: Adequate for Discharge 05/18/2022 0748 by Rodolph Bong, LPN Outcome: Progressing   Problem: Skin Integrity: Goal: Risk for impaired skin integrity will decrease 05/18/2022 1038 by Rodolph Bong, LPN Outcome: Adequate for Discharge 05/18/2022 0748 by Rodolph Bong, LPN Outcome: Progressing   Problem: Education: Goal: Knowledge of condition will improve 05/18/2022 1038 by Rodolph Bong, LPN Outcome: Adequate for Discharge 05/18/2022 0748 by Rodolph Bong, LPN Outcome: Progressing Goal: Individualized Educational Video(s) 05/18/2022 1038 by Rodolph Bong, LPN Outcome: Adequate for Discharge 05/18/2022 0748 by Rodolph Bong, LPN Outcome: Progressing Goal: Individualized Newborn Educational Video(s) 05/18/2022 1038 by Rodolph Bong,  LPN Outcome: Adequate for Discharge 05/18/2022 0748 by Rodolph Bong, LPN Outcome: Progressing   Problem: Activity: Goal: Will verbalize the importance of balancing activity with adequate rest periods 05/18/2022 1038 by Rodolph Bong, LPN Outcome: Adequate for Discharge 05/18/2022 0748 by Rodolph Bong, LPN Outcome: Progressing Goal: Ability to tolerate increased activity will improve 05/18/2022 1038 by Rodolph Bong, LPN Outcome: Adequate for Discharge 05/18/2022 0748 by Rodolph Bong, LPN Outcome: Progressing   Problem: Coping: Goal: Ability to identify and utilize available resources and services will improve 05/18/2022 1038 by Rodolph Bong, LPN Outcome: Adequate for Discharge 05/18/2022 0748 by Rodolph Bong, LPN Outcome: Progressing   Problem: Life Cycle: Goal: Chance of risk for complications during the postpartum period will decrease 05/18/2022 1038 by Rodolph Bong, LPN Outcome: Adequate for Discharge 05/18/2022 0748 by Rodolph Bong, LPN Outcome: Progressing   Problem: Role Relationship: Goal: Ability to demonstrate positive interaction with newborn will improve 05/18/2022 1038 by Rodolph Bong, LPN Outcome: Adequate for Discharge 05/18/2022 0748 by Rodolph Bong, LPN Outcome: Progressing   Problem: Skin Integrity: Goal: Demonstration of wound healing without infection will improve 05/18/2022 1038 by Rodolph Bong, LPN Outcome: Adequate for Discharge 05/18/2022 0748 by Rodolph Bong, LPN Outcome: Progressing

## 2022-05-18 NOTE — Plan of Care (Signed)
  Problem: Education: Goal: Knowledge of Childbirth will improve Outcome: Progressing Goal: Ability to make informed decisions regarding treatment and plan of care will improve Outcome: Progressing Goal: Ability to state and carry out methods to decrease the pain will improve Outcome: Progressing Goal: Individualized Educational Video(s) Outcome: Progressing   Problem: Coping: Goal: Ability to verbalize concerns and feelings about labor and delivery will improve Outcome: Progressing   Problem: Life Cycle: Goal: Ability to make normal progression through stages of labor will improve Outcome: Progressing Goal: Ability to effectively push during vaginal delivery will improve Outcome: Progressing   Problem: Role Relationship: Goal: Will demonstrate positive interactions with the child Outcome: Progressing   Problem: Safety: Goal: Risk of complications during labor and delivery will decrease Outcome: Progressing   Problem: Pain Management: Goal: Relief or control of pain from uterine contractions will improve Outcome: Progressing   Problem: Education: Goal: Knowledge of General Education information will improve Description: Including pain rating scale, medication(s)/side effects and non-pharmacologic comfort measures Outcome: Progressing   Problem: Health Behavior/Discharge Planning: Goal: Ability to manage health-related needs will improve Outcome: Progressing   Problem: Clinical Measurements: Goal: Ability to maintain clinical measurements within normal limits will improve Outcome: Progressing Goal: Will remain free from infection Outcome: Progressing Goal: Diagnostic test results will improve Outcome: Progressing Goal: Respiratory complications will improve Outcome: Progressing Goal: Cardiovascular complication will be avoided Outcome: Progressing   Problem: Activity: Goal: Risk for activity intolerance will decrease Outcome: Progressing   Problem:  Nutrition: Goal: Adequate nutrition will be maintained Outcome: Progressing   Problem: Coping: Goal: Level of anxiety will decrease Outcome: Progressing   Problem: Elimination: Goal: Will not experience complications related to bowel motility Outcome: Progressing Goal: Will not experience complications related to urinary retention Outcome: Progressing   Problem: Pain Managment: Goal: General experience of comfort will improve Outcome: Progressing   Problem: Safety: Goal: Ability to remain free from injury will improve Outcome: Progressing   Problem: Skin Integrity: Goal: Risk for impaired skin integrity will decrease Outcome: Progressing   Problem: Education: Goal: Knowledge of condition will improve Outcome: Progressing Goal: Individualized Educational Video(s) Outcome: Progressing Goal: Individualized Newborn Educational Video(s) Outcome: Progressing   Problem: Activity: Goal: Will verbalize the importance of balancing activity with adequate rest periods Outcome: Progressing Goal: Ability to tolerate increased activity will improve Outcome: Progressing   Problem: Coping: Goal: Ability to identify and utilize available resources and services will improve Outcome: Progressing   Problem: Life Cycle: Goal: Chance of risk for complications during the postpartum period will decrease Outcome: Progressing   Problem: Role Relationship: Goal: Ability to demonstrate positive interaction with newborn will improve Outcome: Progressing   Problem: Skin Integrity: Goal: Demonstration of wound healing without infection will improve Outcome: Progressing   

## 2022-05-19 ENCOUNTER — Encounter: Payer: Self-pay | Admitting: Family Medicine

## 2022-05-23 ENCOUNTER — Inpatient Hospital Stay (HOSPITAL_COMMUNITY)
Admission: AD | Admit: 2022-05-23 | Discharge: 2022-05-24 | Disposition: A | Discharge status: Home / Self Care | Payer: 59 | Attending: Obstetrics and Gynecology | Admitting: Obstetrics and Gynecology

## 2022-05-23 ENCOUNTER — Telehealth: Payer: Self-pay

## 2022-05-23 ENCOUNTER — Encounter (HOSPITAL_COMMUNITY): Payer: Self-pay | Admitting: Obstetrics and Gynecology

## 2022-05-23 DIAGNOSIS — O9089 Other complications of the puerperium, not elsewhere classified: Secondary | ICD-10-CM | POA: Insufficient documentation

## 2022-05-23 DIAGNOSIS — R5383 Other fatigue: Secondary | ICD-10-CM | POA: Insufficient documentation

## 2022-05-23 DIAGNOSIS — R197 Diarrhea, unspecified: Secondary | ICD-10-CM

## 2022-05-23 DIAGNOSIS — R112 Nausea with vomiting, unspecified: Secondary | ICD-10-CM | POA: Diagnosis not present

## 2022-05-23 DIAGNOSIS — O26819 Pregnancy related exhaustion and fatigue, unspecified trimester: Secondary | ICD-10-CM

## 2022-05-23 MED ORDER — ONDANSETRON 4 MG PO TBDP
8.0000 mg | ORAL_TABLET | Freq: Once | ORAL | Status: AC
  Administered 2022-05-23: 8 mg via ORAL
  Filled 2022-05-23: qty 2

## 2022-05-23 NOTE — Telephone Encounter (Signed)
Called pt to come in later on Jan 30th or reschedule. No answer at this time. Left message for pt to return the call.  

## 2022-05-23 NOTE — MAU Note (Signed)
.  Cindy Jones is a 32 y.o. postpartum pt from 05/16/22 where she had svd. FOB reports she had 2nd degree vag lac. Pt reports have lower abd pain and just not feeling well for 2 days. Having some chills. States breasts are alittle red. Having some n/v and watery stools today.   Onset of complaint: 2days Pain score: 7 Vitals:   05/23/22 2153  Pulse: 91  Resp: 20  Temp: 98.8 F (37.1 C)  SpO2: 99%     FHT:n/a Lab orders placed from triage:  none

## 2022-05-23 NOTE — MAU Provider Note (Signed)
History     CSN: [id number]  Arrival date and time: 05/23/22 2141   Event Date/Time   First Provider Initiated Contact with Patient 05/23/22 2250      Chief Complaint  Patient presents with   Abdominal Pain   Generalized Body Aches   Emesis   Cindy Jones is a 32 y.o. G2P1011 who is 7 days PP S/P NSVD. She states that today she has had chills, breast pain, abdominal pain, nausea, diarrhea. She is breastfeeding, but states that she is mostly pumping. She does get good milk emptying when she pumps although she feels like the left breast maybe doesn't always feel like it empties.  She denies a fever, but states that she has chills. She reports that she took ibuprofen and phenergan around 9:00pm, but she may have vomited it up.   Abdominal Pain Associated symptoms include vomiting.  Emesis  Associated symptoms include abdominal pain.    OB History     Gravida  2   Para  1   Term  1   Preterm  0   AB  1   Living  1      SAB  1   IAB  0   Ectopic  0   Multiple  0   Live Births  1           Past Medical History:  Diagnosis Date   Anxiety    Asthma    as a child   Binge eating disorder    Dysrhythmia    " I feel it about once a month"   Frequent headaches    GAD (generalized anxiety disorder)    GERD (gastroesophageal reflux disease)    History of acute PID 06/09/2019   Major depression in complete remission (HCC)    Migraines    PID (acute pelvic inflammatory disease)    Seasonal allergies    Tourette disorder    Urinary tract bacterial infections     Past Surgical History:  Procedure Laterality Date   CHOLECYSTECTOMY     ESOPHAGOGASTRODUODENOSCOPY     LAPAROSCOPIC CHOLECYSTECTOMY SINGLE SITE WITH INTRAOPERATIVE CHOLANGIOGRAM N/A 03/15/2016   Procedure: LAPAROSCOPIC CHOLECYSTECTOMY SINGLE SITE WITH INTRAOPERATIVE CHOLANGIOGRAM;  Surgeon: Michael Boston, MD;  Location: [facility] OR;  Service: General;  Laterality: N/A;   TONSILLECTOMY AND ADENOIDECTOMY   2013   WISDOM TOOTH EXTRACTION      Family History  Problem Relation Age of Onset   Alcohol abuse Mother    Bipolar disorder Mother    Cancer Father        sarcoma; passed away when pt was 70   Asthma Sister    Allergic rhinitis Sister    Eczema Sister    Emphysema Maternal Grandmother    Tourette syndrome Maternal Grandfather    Emphysema Paternal Grandmother    Heart disease Paternal Grandfather    Tourette syndrome Maternal Aunt    Alcohol abuse Maternal Aunt    Alcohol abuse Maternal Uncle    Hyperlipidemia Paternal Aunt    Hashimoto's thyroiditis Paternal Aunt    Suicidality Neg Hx    Urticaria Neg Hx    Angioedema Neg Hx     Social History   Tobacco Use   Smoking status: Never   Smokeless tobacco: Never  Vaping Use   Vaping Use: Never used  Substance Use Topics   Alcohol use: Not Currently    Alcohol/week: 1.0 standard drink of alcohol    Types: 1 Glasses of wine per  week    Comment: occ   Drug use: No    Allergies:  Allergies  Allergen Reactions   Latex Swelling and Rash   Barley Grass Hives   Cranberry Juice Powder Other (See Comments)    Throat pain.   Naproxen Other (See Comments)    "Liver starts hurting" Diarrhea   Nickel     Skin peeling   Sulfa Antibiotics Hives and Swelling    Medications Prior to Admission  Medication Sig Dispense Refill Last Dose   ibuprofen (ADVIL) 600 MG tablet Take 1 tablet (600 mg total) by mouth every 6 (six) hours. 60 tablet 0 05/23/2022 at 1930   promethazine (PHENERGAN) 25 MG tablet Take 25 mg by mouth every 6 (six) hours as needed for nausea or vomiting.   05/23/2022 at 1930   Prenatal Vit-Fe Fumarate-FA (PRENATAL MULTIVITAMIN) TABS tablet Take 1 tablet by mouth daily at 12 noon.       Review of Systems  Gastrointestinal:  Positive for abdominal pain and vomiting.  All other systems reviewed and are negative.  Physical Exam   Pulse 91, temperature 98.8 F (37.1 C), resp. rate 20, height 5\' 6"  (1.676 m),  weight 102.1 kg, SpO2 99 %, currently breastfeeding.  Physical Exam Constitutional:      Appearance: She is well-developed.  HENT:     Head: Normocephalic.  Eyes:     Pupils: Pupils are equal, round, and reactive to light.  Cardiovascular:     Rate and Rhythm: Normal rate.  Pulmonary:     Effort: Pulmonary effort is normal. No respiratory distress.  Abdominal:     Palpations: Abdomen is soft.     Tenderness: There is no abdominal tenderness.  Genitourinary:    Vagina: No bleeding. Vaginal discharge: mucusy.    Comments: External: no lesion Vagina: small amount of white discharge     Musculoskeletal:        General: Normal range of motion.     Cervical back: Normal range of motion and neck supple.  Skin:    General: Skin is warm and dry.  Neurological:     Mental Status: She is alert and oriented to person, place, and time.  Psychiatric:        Mood and Affect: Mood normal.        Behavior: Behavior normal.    Results for orders placed or performed during the hospital encounter of 05/23/22 (from the past 24 hour(s))  CBC with Differential/Platelet     Status: Abnormal   Collection Time: 05/23/22 11:40 PM  Result Value Ref Range   WBC 9.9 4.0 - 10.5 K/uL   RBC 3.81 (L) 3.87 - 5.11 MIL/uL   Hemoglobin 11.7 (L) 12.0 - 15.0 g/dL   HCT 34.5 (L) 36.0 - 46.0 %   MCV 90.6 80.0 - 100.0 fL   MCH 30.7 26.0 - 34.0 pg   MCHC 33.9 30.0 - 36.0 g/dL   RDW 12.1 11.5 - 15.5 %   Platelets 235 150 - 400 K/uL   nRBC 0.0 0.0 - 0.2 %   Neutrophils Relative % 92 %   Neutro Abs 9.0 (H) 1.7 - 7.7 K/uL   Lymphocytes Relative 3 %   Lymphs Abs 0.3 (L) 0.7 - 4.0 K/uL   Monocytes Relative 4 %   Monocytes Absolute 0.4 0.1 - 1.0 K/uL   Eosinophils Relative 0 %   Eosinophils Absolute 0.0 0.0 - 0.5 K/uL   Basophils Relative 0 %   Basophils Absolute 0.0 0.0 -  0.1 K/uL   Immature Granulocytes 1 %   Abs Immature Granulocytes 0.05 0.00 - 0.07 K/uL  Comprehensive metabolic panel     Status:  Abnormal   Collection Time: 05/23/22 11:40 PM  Result Value Ref Range   Sodium 137 135 - 145 mmol/L   Potassium 3.5 3.5 - 5.1 mmol/L   Chloride 104 98 - 111 mmol/L   CO2 21 (L) 22 - 32 mmol/L   Glucose, Bld 99 70 - 99 mg/dL   BUN 19 6 - 20 mg/dL   Creatinine, Ser 8.31 0.44 - 1.00 mg/dL   Calcium 8.3 (L) 8.9 - 10.3 mg/dL   Total Protein 5.6 (L) 6.5 - 8.1 g/dL   Albumin 2.9 (L) 3.5 - 5.0 g/dL   AST 17 15 - 41 U/L   ALT 22 0 - 44 U/L   Alkaline Phosphatase 73 38 - 126 U/L   Total Bilirubin 0.5 0.3 - 1.2 mg/dL   GFR, Estimated >51 >76 mL/min   Anion gap 12 5 - 15     MAU Course  Procedures  MDM  Patient has had zofran. She reports that she is feeling better   Reassured patient that breasts do not show any signs of mastitis. No other signs of an infectious process at this time. Fatigue and body aches could be from normal PP recovery process or she could have viral GI illness. Continue to push hydration and rest. Phenergan wasn't working so we will send with rx for zofran.   Assessment and Plan   1. Nausea and vomiting, unspecified vomiting type   2. Diarrhea, unspecified type   3. Pregnancy related fatigue, antepartum   4. Postpartum care and examination    DC home in stable condition  Comfort measures reviewed  RX: zofran PRN #20  Return to MAU as needed FU with OB as planned   Follow-up Information     [facility] at [facility] Follow up.   Specialty: Family Medicine Why: If symptoms worsen Contact information: [address] [place] [address] [phone]               Thressa Sheller DNP, CNM  05/24/22  12:32 AM

## 2022-05-24 ENCOUNTER — Other Ambulatory Visit (HOSPITAL_COMMUNITY): Payer: Self-pay

## 2022-05-24 ENCOUNTER — Telehealth (HOSPITAL_COMMUNITY): Payer: Self-pay | Admitting: *Deleted

## 2022-05-24 ENCOUNTER — Other Ambulatory Visit: Payer: Self-pay

## 2022-05-24 ENCOUNTER — Telehealth: Payer: Self-pay | Admitting: Clinical

## 2022-05-24 DIAGNOSIS — R112 Nausea with vomiting, unspecified: Secondary | ICD-10-CM

## 2022-05-24 LAB — CBC WITH DIFFERENTIAL/PLATELET
Abs Immature Granulocytes: 0.05 10*3/uL (ref 0.00–0.07)
Basophils Absolute: 0 10*3/uL (ref 0.0–0.1)
Basophils Relative: 0 %
Eosinophils Absolute: 0 10*3/uL (ref 0.0–0.5)
Eosinophils Relative: 0 %
HCT: 34.5 % — ABNORMAL LOW (ref 36.0–46.0)
Hemoglobin: 11.7 g/dL — ABNORMAL LOW (ref 12.0–15.0)
Immature Granulocytes: 1 %
Lymphocytes Relative: 3 %
Lymphs Abs: 0.3 10*3/uL — ABNORMAL LOW (ref 0.7–4.0)
MCH: 30.7 pg (ref 26.0–34.0)
MCHC: 33.9 g/dL (ref 30.0–36.0)
MCV: 90.6 fL (ref 80.0–100.0)
Monocytes Absolute: 0.4 10*3/uL (ref 0.1–1.0)
Monocytes Relative: 4 %
Neutro Abs: 9 10*3/uL — ABNORMAL HIGH (ref 1.7–7.7)
Neutrophils Relative %: 92 %
Platelets: 235 10*3/uL (ref 150–400)
RBC: 3.81 MIL/uL — ABNORMAL LOW (ref 3.87–5.11)
RDW: 12.1 % (ref 11.5–15.5)
WBC: 9.9 10*3/uL (ref 4.0–10.5)
nRBC: 0 % (ref 0.0–0.2)

## 2022-05-24 LAB — COMPREHENSIVE METABOLIC PANEL
ALT: 22 U/L (ref 0–44)
AST: 17 U/L (ref 15–41)
Albumin: 2.9 g/dL — ABNORMAL LOW (ref 3.5–5.0)
Alkaline Phosphatase: 73 U/L (ref 38–126)
Anion gap: 12 (ref 5–15)
BUN: 19 mg/dL (ref 6–20)
CO2: 21 mmol/L — ABNORMAL LOW (ref 22–32)
Calcium: 8.3 mg/dL — ABNORMAL LOW (ref 8.9–10.3)
Chloride: 104 mmol/L (ref 98–111)
Creatinine, Ser: 0.83 mg/dL (ref 0.44–1.00)
GFR, Estimated: >60 mL/min (ref >60)
Glucose, Bld: 99 mg/dL (ref 70–99)
Potassium: 3.5 mmol/L (ref 3.5–5.1)
Sodium: 137 mmol/L (ref 135–145)
Total Bilirubin: 0.5 mg/dL (ref 0.3–1.2)
Total Protein: 5.6 g/dL — ABNORMAL LOW (ref 6.5–8.1)

## 2022-05-24 MED ORDER — ONDANSETRON 8 MG PO TBDP
8.0000 mg | ORAL_TABLET | Freq: Three times a day (TID) | ORAL | 1 refills | Status: DC | PRN
  Filled 2022-05-24: qty 20, 7d supply, fill #0

## 2022-05-24 NOTE — Telephone Encounter (Signed)
Hospital Discharge Follow-Up Call:  Patient reports that she is feeling some better today after being diagnosed with a viral GI illness last night.  She has been drinking fluids and eating crackers today.  OB wise she feels her healing process is going well.  EPDS today was 15 (question 10 was zero) and she endorses that she has been dealing with anxiety.  She is already connected with [facility] at her OB office and is working on getting an appointment.  She is concerned about her breast milk supply as she did not feel well enough to pump today.  She says they have seen [facility] [place] and found that to be helpful. They are also looking into having baby's tongue tie revised.  Encouraged her to keep putting baby to breast and to pump as she feels able to.  Also offered encouragement regarding the enormous transition of life with a newborn.  Patient says that baby is well and she has no concerns about baby's health.  She reports that baby sleeps in a bassinet in parent's room.  Reviewed ABCs of Safe Sleep.

## 2022-05-24 NOTE — Telephone Encounter (Signed)
Attempt call regarding verbal referral;  Left HIPPA-compliant message to call back Roselyn Reef from [employer] for [employer] at [facility] at  [phone] [facility] office).

## 2022-05-25 ENCOUNTER — Telehealth (HOSPITAL_BASED_OUTPATIENT_CLINIC_OR_DEPARTMENT_OTHER): Payer: 59 | Admitting: Psychiatry

## 2022-05-25 DIAGNOSIS — F4321 Adjustment disorder with depressed mood: Secondary | ICD-10-CM | POA: Diagnosis not present

## 2022-05-25 DIAGNOSIS — F411 Generalized anxiety disorder: Secondary | ICD-10-CM | POA: Diagnosis not present

## 2022-05-25 NOTE — Progress Notes (Signed)
Virtual Visit via Video Note  I connected with Cindy Jones on 05/25/22 at 11:30 AM EST by  a video enabled telemedicine application and verified that I am speaking with the correct person using two identifiers.  Location: Patient: Home Provider: office   I discussed the limitations of evaluation and management by telemedicine and the availability of in person appointments. The patient expressed understanding and agreed to proceed.  History of Present Illness: Cindy Jones delivered her son on 05/16/22. She is now on maternity leave for 14 weeks. Cindy Jones is trying to breastfeed and pump bu it is very hard. The first few days after she was born she didn't want to hold the baby because of her high anxiety. A friend helped Cindy Jones to interact with the baby more. It improved but she is feeling guilty that her husband to take care of her and the  baby. She is still healing and can't do many physical things right now but should be able to after 1 week. Cindy Jones feels like exercise is a good for her depression.  It makes her feel sad but is not sure if she is depressed. At times she cries but always feels better than before. Her sleep is ok when she can sleep. They baby is waking up 3x/night and her day starts around 6am. She is eating well. Her anxiety spikes at night before she falls asleep. She did a depression scale with the post partum nurse and the score was 15. She was told to monitor her symptoms and that this is normal post partum blues for 1 weeks post delivery.   She is open to taking medication for depression and anxiety but wants to wait to see if her body regulate itself first.. Cindy Jones is feeling better than when she first came home.   Observations/Objective: Psychiatric Specialty Exam: ROS  currently breastfeeding.There is no height or weight on file to calculate BMI.  General Appearance: Casual  Eye Contact:  Good  Speech:  Clear and Coherent and Normal Rate  Volume:  Normal  Mood:  Anxious and  Depressed  Affect:  Congruent  Thought Process:  Goal Directed, Linear, and Descriptions of Associations: Intact  Orientation:  Full (Time, Place, and Person)  Thought Content:  Logical  Suicidal Thoughts:  No  Homicidal Thoughts:  No  Memory:  Immediate;   Good  Judgement:  Good  Insight:  Good  Psychomotor Activity:  Normal  Concentration:  Concentration: Good  Recall:  Good  Fund of Knowledge:  Good  Language:  Good  Akathisia:  No  Handed:  Right  AIMS (if indicated):     Assets:  Communication Skills Desire for Improvement Financial Resources/Insurance Housing Intimacy Leisure Time Resilience Social Support Talents/Skills Transportation Vocational/Educational  ADL's:  Intact  Cognition:  WNL  Sleep:        Assessment and Plan:     05/25/2022   11:42 AM 02/22/2022   11:38 AM 02/09/2022    3:39 PM 01/25/2022   12:28 PM 12/29/2021    4:14 PM  Depression screen PHQ 2/9  Decreased Interest 1 0 0 1 1  Down, Depressed, Hopeless 1 0 0 0 0  PHQ - 2 Score 2 0 0 1 1  Altered sleeping 1 0 0 0 1  Tired, decreased energy 3 1 3 1 1   Change in appetite 0 0 0 1 0  Feeling bad or failure about yourself  1 1 0 1 1  Trouble concentrating 1 1 3 1  2  Moving slowly or fidgety/restless 0 0 0 0 0  Suicidal thoughts 0 0 0 0 0  PHQ-9 Score 8 3 6 5 6   Difficult doing work/chores Somewhat difficult   Not difficult at all Not difficult at all    Flowsheet Row Video Visit from 05/25/2022 in [facility] Admission (Discharged) from 05/23/2022 in [facility] Admission (Discharged) from 05/15/2022 in [facility] 5S Mother Baby Unit  C-SSRS RISK CATEGORY No Risk No Risk No Risk          Pt is aware that these meds carry a teratogenic risk. Pt will discuss plan of action if she does or plans to become pregnant in the future.  Status of current problems: mild anxiety and depression post partum   Medication management with  supportive therapy. Risks and benefits, side effects and alternative treatment options discussed with patient. Pt was given an opportunity to ask questions about medication, illness, and treatment. All current psychiatric medications have been reviewed and discussed with the patient and adjusted as clinically appropriate.  Pt verbalized understanding and verbal consent obtained for treatment.  Meds:  1. Adjustment disorder with depressed mood  2. GAD (generalized anxiety disorder)     Labs: 05/23/22 Hb 11.7. She is working with her OB for medical management   Therapy: brief supportive therapy provided. Discussed psychosocial stressors in detail.     Collaboration of Care: Other n/a  Patient/Guardian was advised Release of Information must be obtained prior to any record release in order to collaborate their care with an outside provider. Patient/Guardian was advised if they have not already done so to contact the registration department to sign all necessary forms in order for [place] to release information regarding their care.   Consent: Patient/Guardian gives verbal consent for treatment and assignment of benefits for services provided during this visit. Patient/Guardian expressed understanding and agreed to proceed.     Follow Up Instructions: Follow up in 1 month or sooner if needed    I discussed the assessment and treatment plan with the patient. The patient was provided an opportunity to ask questions and all were answered. The patient agreed with the plan and demonstrated an understanding of the instructions.   The patient was advised to call back or seek an in-person evaluation if the symptoms worsen or if the condition fails to improve as anticipated.  I provided 19 minutes of non-face-to-face time during this encounter.   Charlcie Cradle, MD

## 2022-05-30 ENCOUNTER — Telehealth: Payer: No Typology Code available for payment source | Admitting: Cardiology

## 2022-05-31 ENCOUNTER — Telehealth: Payer: Self-pay | Admitting: *Deleted

## 2022-05-31 NOTE — Telephone Encounter (Signed)
Voicemail message left by [facility] RN Jana Half). She stated that this pt delivered her baby on 05/16/22. Pt had score of #10 on Edinburgh scale today. Pt has Hx of depression and currently denies wanting to hurt herself or others. Jana Half stated that pt is aware of course of action if she develops these feelings. Per chart review, pt has [facility] referral to [facility] however no appt has been scheduled yet due to pt has not returned Jamie's call.

## 2022-06-01 ENCOUNTER — Other Ambulatory Visit (HOSPITAL_COMMUNITY): Payer: Self-pay

## 2022-06-06 NOTE — BH Specialist Note (Signed)
Integrated Behavioral Health via Telemedicine Visit  06/20/2022 Cindy Jones [id number]  Number of [place] Clinician visits: 5-Fifth Visit  Session Start time: [id number]   Session End time: 1509  Total time in minutes: 44   Referring Provider: Clayton Lefort, MD Patient/Family location: [facility] Provider location: [facility] [place] [facility]  All persons participating in visit: Patient Cindy Jones and Cindy Jones   Types of Service: Individual psychotherapy and Video visit  I connected with Cindy Jones and/or Cindy Jones's  n/a  via  Telephone or Video Enabled Telemedicine Application  (Video is Caregility application) and verified that I am speaking with the correct person using two identifiers. Discussed confidentiality: Yes   I discussed the limitations of telemedicine and the availability of in person appointments.  Discussed there is a possibility of technology failure and discussed alternative modes of communication if that failure occurs.  I discussed that engaging in this telemedicine visit, they consent to the provision of behavioral healthcare and the services will be billed under their insurance.  Patient and/or legal guardian expressed understanding and consented to Telemedicine visit: Yes   Presenting Concerns: Patient and/or family reports the following symptoms/concerns: Overwhelming fear, anxiety, dread, panic and feeling trapped; at times, "hard to want to hold" baby. Pt experienced a long labor (16 hours prior to epidural), followed by attempts to breastfeed with tongue/lip tie issues, followed by stomach virus and sleep deprivation; baby getting more gassy and spitting up/fussy. Pt is considering anxiety medication to manage anxiety/panic through postpartum time.  Duration of problem: Postpartum; Severity of problem:  moderately severe  Patient and/or Family's Strengths/Protective  Factors: Social connections, Concrete supports in place (healthy food, safe environments, etc.), Sense of purpose, and Physical Health (exercise, healthy diet, medication compliance, etc.)  Goals Addressed: Patient will:  Reduce symptoms of: anxiety, depression, and stress   Increase knowledge and/or ability of: stress reduction   Demonstrate ability to: Increase healthy adjustment to current life circumstances and Increase adequate support systems for patient/family  Progress towards Goals: Ongoing  Interventions: Interventions utilized:  Psychoeducation and/or Health Education, Link to [employer], and Supportive Reflection Standardized Assessments completed: Not Needed  Patient and/or Family Response: Patient agrees with treatment plan.   Assessment: Patient currently experiencing Acute stress reaction; Generalized anxiety disorder .   Patient may benefit from continued therapeutic interventions.  Plan: Follow up with behavioral health clinician on : Three weeks Behavioral recommendations:  -Continue prioritizing healthy self-care (regular meals, adequate rest; allowing practical help from supportive friends and family) until at least postpartum medical appointment -Consider new mom support group as needed at either [email or web address] or [email or web address]  -Discuss medication options with psychiatrist on 06/22/2022 -Remind yourself this stage is temporary. It will get better.  -Consider strategies discussed to help with sleep routine (brief outdoor time in the morning; sound and smell cues in baby's nighttime sleep area)  Referral(s): [facility] (In Clinic) and [employer]:  new mom support  I discussed the assessment and treatment plan with the patient and/or parent/guardian. They were provided an opportunity to ask questions and all were answered. They agreed with the plan and demonstrated an understanding of the instructions.    They were advised to call back or seek an in-person evaluation if the symptoms worsen or if the condition fails to improve as anticipated.  Caroleen Hamman Cindy Bennie, LCSW

## 2022-06-08 NOTE — Telephone Encounter (Signed)
Pt is scheduled to be seen with Rienzi on 06/20/22

## 2022-06-15 NOTE — Progress Notes (Signed)
[place] Partum Visit Note  Cindy Jones is a 32 y.o. G43P1011 female who presents for a postpartum visit. She is 4 weeks postpartum following a normal spontaneous vaginal delivery.  I have fully reviewed the prenatal and intrapartum course. The delivery was at 40.0 gestational weeks.  Anesthesia: epidural. Postpartum course has been uneventful. Baby is doing well. Baby is feeding by bottle - Similac Advance. Bleeding no bleeding. Bowel function is normal. Bladder function is normal. Patient is not sexually active. Contraception method is condoms. Postpartum depression screening: negative.   The pregnancy intention screening data noted above was reviewed. Potential methods of contraception were discussed. The patient elected to proceed with No data recorded.   Edinburgh Postnatal Depression Scale - 06/16/22 1134       Edinburgh Postnatal Depression Scale:  In the Past 7 Days   I have been able to laugh and see the funny side of things. 0    I have looked forward with enjoyment to things. 1    I have blamed myself unnecessarily when things went wrong. 0    I have been anxious or worried for no good reason. 2    I have felt scared or panicky for no good reason. 0    Things have been getting on top of me. 0    I have been so unhappy that I have had difficulty sleeping. 0    I have felt sad or miserable. 0    I have been so unhappy that I have been crying. 0    The thought of harming myself has occurred to me. 0    Edinburgh Postnatal Depression Scale Total 3             Health Maintenance Due  Topic Date Due   COVID-19 Vaccine (5 - 2023-24 season) 04/13/2022   PAP SMEAR-Modifier  06/08/2022    The following portions of the patient's history were reviewed and updated as appropriate: allergies, current medications, past family history, past medical history, past social history, past surgical history, and problem list.  Review of Systems Pertinent items noted in HPI and remainder  of comprehensive ROS otherwise negative.  Objective:  BP 115/79   Pulse 69   Ht 5' 6"$  (1.676 m)   Wt 222 lb 9.6 oz (101 kg)   LMP  (LMP Unknown)   Breastfeeding No   BMI 35.93 kg/m    General:  alert, cooperative, and appears stated age   Breasts:  not indicated  Lungs: Comfortalbe on room air  Wound N/a  GU exam:   Normal external and internal genitalia, no remaining suture seen         Assessment:    There are no diagnoses linked to this encounter.  Normal postpartum exam.   Plan:   Essential components of care per ACOG recommendations:  1.  Mood and well being: Patient with negative depression screening today. Reviewed local resources for support.  - Patient tobacco use? No.   - hx of drug use? No.    2. Infant care and feeding:  -Patient currently breastmilk feeding? No.  -Social determinants of health (SDOH) reviewed in EPIC. No concerns  3. Sexuality, contraception and birth spacing - Patient does not want a pregnancy in the next year.  Desired family size is 2 children.  - Reviewed reproductive life planning. Reviewed contraceptive methods based on pt preferences and effectiveness.  Patient desired Female Condom today.   - Discussed birth spacing of 18 months  4. Sleep and fatigue -Encouraged family/partner/community support of 4 hrs of uninterrupted sleep to help with mood and fatigue  5. Physical Recovery  - Discussed patients delivery and complications. She describes her labor as good. - Patient had a Vaginal, no problems at delivery. Patient had a 2nd degree laceration. Perineal healing reviewed. Patient expressed understanding - Patient has urinary incontinence? No. - Patient is safe to resume physical and sexual activity  6.  Health Maintenance - HM due items addressed Yes - Last pap smear  Diagnosis  Date Value Ref Range Status  06/09/2019      - Negative for Intraepithelial Lesions or Malignancy (NILM)  06/09/2019 - Benign reactive/reparative  changes     Pap smear done at today's visit.  -Breast Cancer screening indicated? No.   7. Chronic Disease/Pregnancy Condition follow up: None  - PCP follow up  [facility] [place]

## 2022-06-16 ENCOUNTER — Other Ambulatory Visit: Payer: Self-pay

## 2022-06-16 ENCOUNTER — Encounter: Payer: Self-pay | Admitting: Family Medicine

## 2022-06-16 ENCOUNTER — Ambulatory Visit (INDEPENDENT_AMBULATORY_CARE_PROVIDER_SITE_OTHER): Payer: 59 | Admitting: Family Medicine

## 2022-06-16 ENCOUNTER — Other Ambulatory Visit (HOSPITAL_COMMUNITY)
Admission: RE | Admit: 2022-06-16 | Discharge: 2022-06-16 | Disposition: A | Discharge status: Home / Self Care | Payer: 59 | Source: Ambulatory Visit | Attending: Family Medicine | Admitting: Family Medicine

## 2022-06-16 DIAGNOSIS — Z124 Encounter for screening for malignant neoplasm of cervix: Secondary | ICD-10-CM | POA: Insufficient documentation

## 2022-06-19 ENCOUNTER — Telehealth: Payer: Self-pay | Admitting: Clinical

## 2022-06-19 NOTE — Telephone Encounter (Signed)
Return call to pt regarding rescheduling appointment currently scheduled for 06/20/22; Left HIPPA-compliant message to call back Cindy Jones from [facility] for [employer] at [facility] at  [phone] [facility] office) or front desk at [phone] to reschedule appointment.

## 2022-06-20 ENCOUNTER — Ambulatory Visit (INDEPENDENT_AMBULATORY_CARE_PROVIDER_SITE_OTHER): Payer: 59 | Admitting: Clinical

## 2022-06-20 DIAGNOSIS — F411 Generalized anxiety disorder: Secondary | ICD-10-CM

## 2022-06-20 DIAGNOSIS — F430 Acute stress reaction: Secondary | ICD-10-CM

## 2022-06-20 LAB — CYTOLOGY - PAP
Comment: NEGATIVE
Diagnosis: NEGATIVE
Diagnosis: REACTIVE
High risk HPV: NEGATIVE

## 2022-06-20 NOTE — Patient Instructions (Addendum)
[facility] at [facility] [place], [place] [address] [phone] (main office) [phone] [facility] office)  New Parent Support Groups [email or web address]

## 2022-06-22 ENCOUNTER — Other Ambulatory Visit (HOSPITAL_COMMUNITY): Payer: Self-pay

## 2022-06-22 ENCOUNTER — Telehealth (HOSPITAL_BASED_OUTPATIENT_CLINIC_OR_DEPARTMENT_OTHER): Payer: 59 | Admitting: Psychiatry

## 2022-06-22 DIAGNOSIS — F411 Generalized anxiety disorder: Secondary | ICD-10-CM | POA: Diagnosis not present

## 2022-06-22 DIAGNOSIS — F410 Panic disorder [episodic paroxysmal anxiety] without agoraphobia: Secondary | ICD-10-CM | POA: Diagnosis not present

## 2022-06-22 DIAGNOSIS — F952 Tourette's disorder: Secondary | ICD-10-CM

## 2022-06-22 MED ORDER — NAC 600 MG PO CAPS
600.0000 mg | ORAL_CAPSULE | Freq: Every day | ORAL | 0 refills | Status: DC
  Filled 2022-06-22: qty 90, 90d supply, fill #0

## 2022-06-22 MED ORDER — BUPROPION HCL ER (XL) 150 MG PO TB24
150.0000 mg | ORAL_TABLET | ORAL | 0 refills | Status: DC
  Filled 2022-06-22: qty 30, 30d supply, fill #0

## 2022-06-22 NOTE — Progress Notes (Signed)
Virtual Visit via Video Note  I connected with Cindy Jones on 06/22/22 at 11:30 AM EST by  a video enabled telemedicine application and verified that I am speaking with the correct person using two identifiers.  Location: Patient: home Provider: office   I discussed the limitations of evaluation and management by telemedicine and the availability of in person appointments. The patient expressed understanding and agreed to proceed.  History of Present Illness: "Not great". Cindy Jones share she has really bad anxiety when she thinks about her son is crying, when leaving him and when it' dark and being alone with him. Her son is now 60 weeks old and he is crying for everything. She had to go into work a few days and had intense anxiety the night before. This has been going on since her son was born. She is not able to talk herself out of it. She has near daily panic attacks. Cindy Jones is having intrusive thoughts- "I can not continue to live this way".  She denies SI/HI. She is overwhelmed and can't relax. It is scary and she feels like she is failing. She is unable to sleep well due to anxiety. Her Tourette's is ongoing and she has not been paying attention to it. She goes back to work on 4/23. She had a therapy session earlier this week with someone at her OB's office.  She is not breastfeeding as of 2 months ago.    Observations/Objective: Psychiatric Specialty Exam: ROS  not currently breastfeeding.There is no height or weight on file to calculate BMI.  General Appearance: Fairly Groomed and Neat  Eye Contact:  Good  Speech:  Clear and Coherent and Normal Rate  Volume:  Normal  Mood:  Anxious  Affect:  Congruent  Thought Process:  Goal Directed, Linear, and Descriptions of Associations: Intact  Orientation:  Full (Time, Place, and Person)  Thought Content:  Logical  Suicidal Thoughts:  No  Homicidal Thoughts:  No  Memory:  Immediate;   Good  Judgement:  Good  Insight:  Good  Psychomotor  Activity:  Normal  Concentration:  Concentration: Good  Recall:  Good  Fund of Knowledge:  Good  Language:  Good  Akathisia:  No  Handed:  Right  AIMS (if indicated):     Assets:  Communication Skills Desire for Improvement Financial Resources/Insurance [place] Talents/Skills Transportation Vocational/Educational  ADL's:  Intact  Cognition:  WNL  Sleep:        Assessment and Plan:     05/25/2022   11:42 AM 02/22/2022   11:38 AM 02/09/2022    3:39 PM 01/25/2022   12:28 PM 12/29/2021    4:14 PM  Depression screen PHQ 2/9  Decreased Interest 1 0 0 1 1  Down, Depressed, Hopeless 1 0 0 0 0  PHQ - 2 Score 2 0 0 1 1  Altered sleeping 1 0 0 0 1  Tired, decreased energy 3 1 3 1 1  $ Change in appetite 0 0 0 1 0  Feeling bad or failure about yourself  1 1 0 1 1  Trouble concentrating 1 1 3 1 2  $ Moving slowly or fidgety/restless 0 0 0 0 0  Suicidal thoughts 0 0 0 0 0  PHQ-9 Score 8 3 6 5 6  $ Difficult doing work/chores Somewhat difficult   Not difficult at all Not difficult at all    Flowsheet Row Video Visit from 05/25/2022 in [facility] Ad[facility] (Discharged) from 05/23/2022  in [facility] Admission (Discharged) from 05/15/2022 in [facility]  C-SSRS RISK CATEGORY No Risk No Risk No Risk          Pt is aware that these meds carry a teratogenic risk. Pt will discuss plan of action if she does or plans to become pregnant in the future.  Status of current problems: severe post partum anxiety   Medication management with supportive therapy. Risks and benefits, side effects and alternative treatment options discussed with patient. Pt was given an opportunity to ask questions about medication, illness, and treatment. All current psychiatric medications have been reviewed and discussed with the patient and adjusted as clinically appropriate.  Pt verbalized understanding and  verbal consent obtained for treatment.  Meds: start trial of Wellbutrin XL 131m po qD Start trial of NAC for anxiety 1. GAD (generalized anxiety disorder) - Acetylcysteine (NAC) 600 MG CAPS; Take 1 capsule (600 mg total) by mouth daily in the afternoon.  Dispense: 90 capsule; Refill: 0 - buPROPion (WELLBUTRIN XL) 150 MG 24 hr tablet; Take 1 tablet (150 mg total) by mouth every morning.  Dispense: 30 tablet; Refill: 0  2. Panic attacks  3. Tourette disease     Labs: none    Therapy: brief supportive therapy provided. Discussed psychosocial stressors in detail.     Collaboration of Care: Referral or follow-up with counselor/therapist AEB therapy  Patient/Guardian was advised Release of Information must be obtained prior to any record release in order to collaborate their care with an outside provider. Patient/Guardian was advised if they have not already done so to contact the registration department to sign all necessary forms in order for [facility] release information regarding their care.   Consent: Patient/Guardian gives verbal consent for treatment and assignment of benefits for services provided during this visit. Patient/Guardian expressed understanding and agreed to proceed.     Follow Up Instructions: Follow up in 2-4 weeks or sooner if needed    I discussed the assessment and treatment plan with the patient. The patient was provided an opportunity to ask questions and all were answered. The patient agreed with the plan and demonstrated an understanding of the instructions.   The patient was advised to call back or seek an in-person evaluation if the symptoms worsen or if the condition fails to improve as anticipated.  I provided 26 minutes of non-face-to-face time during this encounter.   SCharlcie Cradle MD

## 2022-06-27 NOTE — BH Specialist Note (Signed)
Pt did not arrive to video visit and did not answer the phone; Left HIPPA-compliant message to call back Pasha Broad from [facility] at  [phone] (Toshiko Kemler's office).  ?; left MyChart message for patient.  ? ?

## 2022-06-28 ENCOUNTER — Ambulatory Visit: Payer: 59 | Attending: Cardiovascular Disease | Admitting: Cardiology

## 2022-06-28 VITALS — BP 124/64 | HR 60 | Ht 66.0 in | Wt 222.0 lb

## 2022-06-28 DIAGNOSIS — R55 Syncope and collapse: Secondary | ICD-10-CM | POA: Diagnosis not present

## 2022-06-28 NOTE — Patient Instructions (Signed)
Medication Instructions:  Your physician recommends that you continue on your current medications as directed. Please refer to the Current Medication list given to you today.  *If you need a refill on your cardiac medications before your next appointment, please call your pharmacy*   Lab Work: None   Testing/Procedures: None   Follow-Up: At [facility], you and your health needs are our priority.  As part of our continuing mission to provide you with exceptional heart care, we have created designated Provider Care Teams.  These Care Teams include your primary Cardiologist (physician) and Advanced Practice Providers (APPs -  Physician Assistants and Nurse Practitioners) who all work together to provide you with the care you need, when you need it.   Your next appointment:   9 month(s)  Provider:   Berniece Salines, DO

## 2022-06-28 NOTE — Progress Notes (Signed)
Cardio-Obstetrics Clinic  Follow Up Note   Date:  06/28/2022   ID:  Cindy Jones, DOB Jul 17, 1990, MRN [id number]  PCP:  Donnamae Jude, MD   [place] Providers Cardiologist:  Berniece Salines, DO  Electrophysiologist:  None        Referring MD: Ma Hillock, DO   Chief Complaint: " I I am feeling more anxious and was recently started on medication"  The patient is at home. I am in the office.  Virtual Visit via Video  Note . I connected with the patient today by a   video enabled telemedicine application and verified that I am speaking with the correct person using two identifiers.  History of Present Illness:    Cindy Jones is a 32 y.o. female [G2P1011] who returns for follow up of syncope. Medical history includes vitamin D deficiency, posture orthostatic syndrome. I saw the patient in July 2023 at that time she was experiencing some lightheadedness.  She had gotten the echocardiogram which I reviewed. Because of her symptoms I placed a monitor on the patient. I have since discussed her monitor result with the patient via [place].   Prior CV Studies Reviewed: The following studies were reviewed today:  Expand All Collapse All   Cardio-Obstetrics Clinic   New Evaluation   Date:  11/15/2021    ID:  Cindy Jones, DOB Nov 26, 1990, MRN [id number]   PCP:  Donnamae Jude, MD              [facility] Providers Cardiologist:  Berniece Salines, DO  Electrophysiologist:  None        Referring MD: Ma Hillock, DO    Chief Complaint: " I am ok"   History of Present Illness:     Cindy Jones is a 32 y.o. female [G2P0010] who is being seen today for the evaluation of lightheadedness at the request of Kuneff, Renee A, DO.  Medical history includes previous recurrent syncope which occurred mainly in February 2022, vitamin D deficiency suspected postural orthostatic tachycardia syndrome here today to be evaluated in the cardiac status clinic due to  significant lightheadedness.     Prior CV Studies Reviewed: The following studies were reviewed today:  ZIO monitor December 02, 2021 Patient had a min HR of 55 bpm, max HR of 167 bpm, and avg HR of 81 bpm. Predominant underlying rhythm was Sinus Rhythm. 1 run of Supraventricular Tachycardia occurred lasting 4 beats with a max rate of 129 bpm (avg 123 bpm). Isolated SVEs were rare  (<1.0%), and no SVE Couplets or SVE Triplets were present. Isolated VEs were rare (<1.0%), and no VE Couplets or VE Triplets were present.    Conclusion: Rare asymptomatic paroxysmal supraventricular tachycardia.   TTE 07/29/2020  1. Prominent apical and lateral LV wall trabeculations . Left ventricular ejection fraction, by estimation, is 60 to 65%. The left ventricle has  normal function. The left ventricle has no regional wall motion abnormalities. Left ventricular diastolic  parameters were normal.   2. Right ventricular systolic function is normal. The right ventricular size is normal.   3. The mitral valve is normal in structure. Trivial mitral valve regurgitation. No evidence of mitral stenosis.   4. The aortic valve is tricuspid. Aortic valve regurgitation is trivial. No aortic stenosis is present.   5. The inferior vena cava is normal in size with greater than 50% respiratory variability, suggesting right atrial pressure of 3 mmHg.    Zio 07/28/2020  Impression: 1. No arrhythmias detected.  2. Rare ectopy.      Past Medical History:  Diagnosis Date   Anxiety    Asthma    as a child   Binge eating disorder    Dysrhythmia    " I feel it about once a month"   Frequent headaches    GAD (generalized anxiety disorder)    GERD (gastroesophageal reflux disease)    History of acute PID 06/09/2019   Major depression in complete remission ([place])    Migraines    PID (acute pelvic inflammatory disease)    Seasonal allergies    Tourette disorder    Urinary tract bacterial infections     Past Surgical  History:  Procedure Laterality Date   CHOLECYSTECTOMY     ESOPHAGOGASTRODUODENOSCOPY     LAPAROSCOPIC CHOLECYSTECTOMY SINGLE SITE WITH INTRAOPERATIVE CHOLANGIOGRAM N/A 03/15/2016   Procedure: LAPAROSCOPIC CHOLECYSTECTOMY SINGLE SITE WITH INTRAOPERATIVE CHOLANGIOGRAM;  Surgeon: Michael Boston, MD;  Location: [facility];  Service: General;  Laterality: N/A;   TONSILLECTOMY AND ADENOIDECTOMY  2013   WISDOM TOOTH EXTRACTION        OB History     Gravida  2   Para  1   Term  1   Preterm  0   AB  1   Living  1      SAB  1   IAB  0   Ectopic  0   Multiple  0   Live Births  1               Current Medications: No outpatient medications have been marked as taking for the 06/28/22 encounter (Video Visit) with Berniece Salines, DO.     Allergies:   Latex, Barley grass, Cranberry juice powder, Naproxen, Nickel, and Sulfa antibiotics   Social History   Socioeconomic History   Marital status: Married    Spouse name: Not on file   Number of children: 0   Years of education: Not on file   Highest education level: Not on file  Occupational History   Occupation: Administrator porfolio  Tobacco Use   Smoking status: Never   Smokeless tobacco: Never  Vaping Use   Vaping Use: Never used  Substance and Sexual Activity   Alcohol use: Not Currently    Alcohol/week: 1.0 standard drink of alcohol    Types: 1 Glasses of wine per week    Comment: occ   Drug use: No   Sexual activity: Yes    Partners: Male    Birth control/protection: Condom    Comment: last sexual intercourse 1/14  Other Topics Concern   Not on file  Social History Narrative   - Married, no children.   - Secretary/administrator education.   - She works FT as a Physiological scientist.   - Her aunt & uncle were her guardians when she was in [place]. They live in [place], [place].    - She has a fraternal twin sister.    - Wears her seatbelt, smoke detectors in the home.   Right handed   Social Determinants of Health   Financial Resource  Strain: Not on file  Food Insecurity: No Food Insecurity (05/15/2022)   Hunger Vital Sign    Worried About Running Out of Food in the Last Year: Never true    Ran Out of Food in the Last Year: Never true  Transportation Needs: No Transportation Needs (05/15/2022)   PRAPARE - Hydrologist (Medical):  No    Lack of Transportation (Non-Medical): No  Physical Activity: Not on file  Stress: Not on file  Social Connections: Not on file      Family History  Problem Relation Age of Onset   Alcohol abuse Mother    Bipolar disorder Mother    Cancer Father        sarcoma; passed away when pt was 8   Asthma Sister    Allergic rhinitis Sister    Eczema Sister    Emphysema Maternal Grandmother    Tourette syndrome Maternal Grandfather    Emphysema Paternal Grandmother    Heart disease Paternal Grandfather    Tourette syndrome Maternal Aunt    Alcohol abuse Maternal Aunt    Alcohol abuse Maternal Uncle    Hyperlipidemia Paternal Aunt    Hashimoto's thyroiditis Paternal Aunt    Suicidality Neg Hx    Urticaria Neg Hx    Angioedema Neg Hx       ROS:   Please see the history of present illness.     All other systems reviewed and are negative.   Labs/EKG Reviewed:    EKG:   EKG is was not ordered today.    Recent Labs: 05/23/2022: ALT 22; BUN 19; Creatinine, Ser 0.83; Hemoglobin 11.7; Platelets 235; Potassium 3.5; Sodium 137   Recent Lipid Panel Lab Results  Component Value Date/Time   CHOL 124 10/22/2020 10:36 AM   TRIG 51.0 10/22/2020 10:36 AM   HDL 48.60 10/22/2020 10:36 AM   CHOLHDL 3 10/22/2020 10:36 AM   LDLCALC 65 10/22/2020 10:36 AM   LDLCALC 48 10/27/2019 01:57 PM   LDLDIRECT 53.0 07/14/2016 08:33 AM    Physical Exam:    VS:  BP 124/64   Pulse 60   Ht '5\' 6"'$  (1.676 m)   Wt 100.7 kg   LMP  (LMP Unknown)   Breastfeeding No   BMI 35.83 kg/m     Wt Readings from Last 3 Encounters:  06/28/22 100.7 kg  06/16/22 101 kg  05/23/22 102.1  kg     GEN:  Well nourished, well developed in no acute distress HEENT: Normal NECK: No JVD; No carotid bruits LYMPHATICS: No lymphadenopathy CARDIAC: RRR, no murmurs, rubs, gallops RESPIRATORY:  Clear to auscultation without rales, wheezing or rhonchi  ABDOMEN: Soft, non-tender, non-distended MUSCULOSKELETAL:  No edema; No deformity  SKIN: Warm and dry NEUROLOGIC:  Alert and oriented x 3 PSYCHIATRIC:  Normal affect    Risk Assessment/Risk Calculators:                  ASSESSMENT & PLAN:    Syncope  Fatigue Lightheadedness  No recurrent syncope.  Will continue to monitor the patient. I am happy for her as she is having her treated.  The patient is in agreement with the above plan. The patient left the office in stable condition.  The patient will follow up in months.  Time spent 12 minutes.  Patient Instructions  Medication Instructions:  Your physician recommends that you continue on your current medications as directed. Please refer to the Current Medication list given to you today.  *If you need a refill on your cardiac medications before your next appointment, please call your pharmacy*   Lab Work: None   Testing/Procedures: None   Follow-Up: At [facility], you and your health needs are our priority.  As part of our continuing mission to provide you with exceptional heart care, we have created designated Provider Care Teams.  These Care Teams include your primary Cardiologist (physician) and Advanced Practice Providers (APPs -  Physician Assistants and Nurse Practitioners) who all work together to provide you with the care you need, when you need it.   Your next appointment:   9 month(s)  Provider:   Berniece Salines, DO     Dispo:  No follow-ups on file.   Medication Adjustments/Labs and Tests Ordered: Current medicines are reviewed at length with the patient today.  Concerns regarding medicines are outlined above.  Tests Ordered: No  orders of the defined types were placed in this encounter.  Medication Changes: No orders of the defined types were placed in this encounter.

## 2022-06-29 ENCOUNTER — Encounter (HOSPITAL_COMMUNITY): Payer: Self-pay | Admitting: Psychiatry

## 2022-06-29 ENCOUNTER — Other Ambulatory Visit (HOSPITAL_COMMUNITY): Payer: Self-pay

## 2022-06-29 ENCOUNTER — Telehealth (HOSPITAL_BASED_OUTPATIENT_CLINIC_OR_DEPARTMENT_OTHER): Payer: 59 | Admitting: Psychiatry

## 2022-06-29 DIAGNOSIS — F411 Generalized anxiety disorder: Secondary | ICD-10-CM | POA: Diagnosis not present

## 2022-06-29 DIAGNOSIS — F410 Panic disorder [episodic paroxysmal anxiety] without agoraphobia: Secondary | ICD-10-CM

## 2022-06-29 DIAGNOSIS — F4321 Adjustment disorder with depressed mood: Secondary | ICD-10-CM | POA: Diagnosis not present

## 2022-06-29 MED ORDER — HYDROXYZINE HCL 10 MG PO TABS
10.0000 mg | ORAL_TABLET | Freq: Two times a day (BID) | ORAL | 0 refills | Status: DC | PRN
  Filled 2022-06-29: qty 90, 45d supply, fill #0

## 2022-06-29 MED ORDER — BUPROPION HCL ER (XL) 150 MG PO TB24
150.0000 mg | ORAL_TABLET | ORAL | 0 refills | Status: DC
  Filled 2022-06-29 – 2022-07-17 (×2): qty 90, 90d supply, fill #0

## 2022-06-29 NOTE — Progress Notes (Signed)
Virtual Visit via Video Note  I connected with Cindy Jones on 06/29/22 at  8:45 AM EST by  a video enabled telemedicine application and verified that I am speaking with the correct person using two identifiers.  Location: Patient: home Provider: office   I discussed the limitations of evaluation and management by telemedicine and the availability of in person appointments. The patient expressed understanding and agreed to proceed.  History of Present Illness: This is a 1 week follow up. We started Wellbutrin and NAC 1 week ago. She is sleeping a little better with OTC Melatonin. Cindy Jones has ongoing anxiety and had a panic attack yesterday. She denies depression and SI/HI. She notes that she has been a little more motivated this week and has been working out. Her appetite is fair-ish and has not changed since delivering her baby. Cindy Jones wants to continue both meds and denies side effects. She went to her cardiologist for follow up on POTS. Her BP was 126/64.    Observations/Objective: Psychiatric Specialty Exam: ROS  Blood pressure 126/64, not currently breastfeeding.There is no height or weight on file to calculate BMI.  General Appearance: Casual and Fairly Groomed  Eye Contact:  Good  Speech:  Clear and Coherent and Normal Rate  Volume:  Normal  Mood:  Anxious  Affect:  Full Range  Thought Process:  Goal Directed, Linear, and Descriptions of Associations: Intact  Orientation:  Full (Time, Place, and Person)  Thought Content:  Logical  Suicidal Thoughts:  No  Homicidal Thoughts:  No  Memory:  Immediate;   Good  Judgement:  Good  Insight:  Good  Psychomotor Activity:  Normal  Concentration:  Concentration: Good  Recall:  Good  Fund of Knowledge:  Good  Language:  Good  Akathisia:  No  Handed:  Right  AIMS (if indicated):     Assets:  Communication Skills Desire for Improvement Financial Resources/Insurance [place] Talents/Skills Transportation Vocational/Educational  ADL's:  Intact  Cognition:  WNL  Sleep:        Assessment and Plan:     05/25/2022   11:42 AM 02/22/2022   11:38 AM 02/09/2022    3:39 PM 01/25/2022   12:28 PM 12/29/2021    4:14 PM  Depression screen PHQ 2/9  Decreased Interest 1 0 0 1 1  Down, Depressed, Hopeless 1 0 0 0 0  PHQ - 2 Score 2 0 0 1 1  Altered sleeping 1 0 0 0 1  Tired, decreased energy '3 1 3 1 1  '$ Change in appetite 0 0 0 1 0  Feeling bad or failure about yourself  1 1 0 1 1  Trouble concentrating '1 1 3 1 2  '$ Moving slowly or fidgety/restless 0 0 0 0 0  Suicidal thoughts 0 0 0 0 0  PHQ-9 Score '8 3 6 5 6  '$ Difficult doing work/chores Somewhat difficult   Not difficult at all Not difficult at all    Flowsheet Row Video Visit from 05/25/2022 in [facility] Admission (Discharged) from 05/23/2022 in [facility] Admission (Discharged) from 05/15/2022 in [place] 5S Mother Baby Unit  C-SSRS RISK CATEGORY No Risk No Risk No Risk          Pt is aware that these meds carry a teratogenic risk. Pt will discuss plan of action if she does or plans to become pregnant in the future.  Status of current problems: ongoing anxiety but is  tolerating Wellbutrin and Vistaril   Medication management with supportive therapy. Risks and benefits, side effects and alternative treatment options discussed with patient. Pt was given an opportunity to ask questions about medication, illness, and treatment. All current psychiatric medications have been reviewed and discussed with the patient and adjusted as clinically appropriate.  Pt verbalized understanding and verbal consent obtained for treatment.  Meds: start trail of Vistaril '10mg'$  po BID prn anxiety 1. Adjustment disorder with depressed mood - hydrOXYzine (ATARAX) 10 MG tablet; Take 1 tablet (10 mg total) by mouth 2 (two) times daily as needed.  Dispense: 90 tablet;  Refill: 0  2. GAD (generalized anxiety disorder) - buPROPion (WELLBUTRIN XL) 150 MG 24 hr tablet; Take 1 tablet (150 mg total) by mouth every morning.  Dispense: 90 tablet; Refill: 0  3. Panic attacks - hydrOXYzine (ATARAX) 10 MG tablet; Take 1 tablet (10 mg total) by mouth 2 (two) times daily as needed.  Dispense: 90 tablet; Refill: 0     Labs: none    Therapy: brief supportive therapy provided. Discussed psychosocial stressors in detail.  Reviewed ways of responding to anxiety in a productive manner    Collaboration of Care: Referral or follow-up with counselor/therapist AEB for therapy for anxiety  Patient/Guardian was advised Release of Information must be obtained prior to any record release in order to collaborate their care with an outside provider. Patient/Guardian was advised if they have not already done so to contact the registration department to sign all necessary forms in order for [place] to release information regarding their care.   Consent: Patient/Guardian gives verbal consent for treatment and assignment of benefits for services provided during this visit. Patient/Guardian expressed understanding and agreed to proceed.    Follow Up Instructions: Follow up in 2 months or sooner if needed    I discussed the assessment and treatment plan with the patient. The patient was provided an opportunity to ask questions and all were answered. The patient agreed with the plan and demonstrated an understanding of the instructions.   The patient was advised to call back or seek an in-person evaluation if the symptoms worsen or if the condition fails to improve as anticipated.  I provided 16 minutes of non-face-to-face time during this encounter.   Charlcie Cradle, MD

## 2022-07-03 ENCOUNTER — Telehealth (HOSPITAL_COMMUNITY): Payer: Self-pay | Admitting: Psychiatry

## 2022-07-06 DIAGNOSIS — F411 Generalized anxiety disorder: Secondary | ICD-10-CM | POA: Diagnosis not present

## 2022-07-11 ENCOUNTER — Ambulatory Visit: Payer: 59 | Admitting: Clinical

## 2022-07-11 DIAGNOSIS — Z91199 Patient's noncompliance with other medical treatment and regimen due to unspecified reason: Secondary | ICD-10-CM

## 2022-07-11 DIAGNOSIS — F411 Generalized anxiety disorder: Secondary | ICD-10-CM | POA: Diagnosis not present

## 2022-07-17 ENCOUNTER — Other Ambulatory Visit (HOSPITAL_COMMUNITY): Payer: Self-pay

## 2022-07-17 ENCOUNTER — Other Ambulatory Visit: Payer: Self-pay

## 2022-07-18 DIAGNOSIS — F411 Generalized anxiety disorder: Secondary | ICD-10-CM | POA: Diagnosis not present

## 2022-07-20 ENCOUNTER — Other Ambulatory Visit (HOSPITAL_COMMUNITY): Payer: Self-pay

## 2022-08-03 DIAGNOSIS — F411 Generalized anxiety disorder: Secondary | ICD-10-CM | POA: Diagnosis not present

## 2022-08-08 DIAGNOSIS — F411 Generalized anxiety disorder: Secondary | ICD-10-CM | POA: Diagnosis not present

## 2022-08-10 ENCOUNTER — Telehealth (HOSPITAL_BASED_OUTPATIENT_CLINIC_OR_DEPARTMENT_OTHER): Payer: 59 | Admitting: Psychiatry

## 2022-08-10 ENCOUNTER — Other Ambulatory Visit (HOSPITAL_BASED_OUTPATIENT_CLINIC_OR_DEPARTMENT_OTHER): Payer: Self-pay

## 2022-08-10 DIAGNOSIS — F988 Other specified behavioral and emotional disorders with onset usually occurring in childhood and adolescence: Secondary | ICD-10-CM

## 2022-08-10 DIAGNOSIS — F952 Tourette's disorder: Secondary | ICD-10-CM

## 2022-08-10 DIAGNOSIS — F410 Panic disorder [episodic paroxysmal anxiety] without agoraphobia: Secondary | ICD-10-CM | POA: Diagnosis not present

## 2022-08-10 DIAGNOSIS — F411 Generalized anxiety disorder: Secondary | ICD-10-CM

## 2022-08-10 MED ORDER — AMPHETAMINE-DEXTROAMPHET ER 15 MG PO CP24
15.0000 mg | ORAL_CAPSULE | Freq: Every day | ORAL | 0 refills | Status: DC
Start: 2022-08-10 — End: 2022-08-31
  Filled 2022-08-10: qty 30, 30d supply, fill #0

## 2022-08-10 MED ORDER — HYDROXYZINE HCL 10 MG PO TABS
10.0000 mg | ORAL_TABLET | Freq: Two times a day (BID) | ORAL | 0 refills | Status: DC | PRN
Start: 2022-08-10 — End: 2022-08-31
  Filled 2022-08-10: qty 90, 45d supply, fill #0

## 2022-08-10 MED ORDER — BUPROPION HCL ER (XL) 150 MG PO TB24
150.0000 mg | ORAL_TABLET | ORAL | 0 refills | Status: DC
Start: 2022-08-10 — End: 2022-08-31
  Filled 2022-08-10: qty 90, 90d supply, fill #0

## 2022-08-10 NOTE — Progress Notes (Signed)
Virtual Visit via Video Note  I connected with Cindy Jones on 08/10/22 at 10:00 AM EDT by a video enabled telemedicine application and verified that I am speaking with the correct person using two identifiers.  Location: Patient: home Provider: office   I discussed the limitations of evaluation and management by telemedicine and the availability of in person appointments. The patient expressed understanding and agreed to proceed.  History of Present Illness: Cindy Jones shares that her anxiety is a lot better. She is no longer scared of being alone with the baby. Gianah is able to tolerate being out with the baby in public. She doesn't worry as much when he cries. The baby is sleeping thru the night and she is getting good rest. Her appetite and energy are good. She is going to the gym and enjoys working out. She denies depression and irritability. She starts work again in 2 weeks. Her mother in law will take care of the baby while Malcolm is working. She is a little worried about going back to work. She wants to restart Adderall at a lower dose. She denies SI/HI. She denies panic attacks.  Her tics are ongoing and come on with stress. She is tolerating her meds and denies SE.    Observations/Objective: Psychiatric Specialty Exam: ROS  not currently breastfeeding.There is no height or weight on file to calculate BMI.  General Appearance: Casual  Eye Contact:  Good  Speech:  Clear and Coherent and Normal Rate  Volume:  Normal  Mood:  Euthymic  Affect:  Full Range  Thought Process:  Goal Directed, Linear, and Descriptions of Associations: Intact  Orientation:  Full (Time, Place, and Person)  Thought Content:  Logical  Suicidal Thoughts:  No  Homicidal Thoughts:  No  Memory:  Immediate;   Good  Judgement:  Good  Insight:  Good  Psychomotor Activity:  Normal  Concentration:  Concentration: Good  Recall:  Good  Fund of Knowledge:  Good  Language:  Good  Akathisia:  No  Handed:  Right  AIMS  (if indicated):     Assets:  Communication Skills Desire for Improvement Financial Resources/Insurance Housing Intimacy Leisure Time Physical Health Resilience Social Support Talents/Skills Transportation Vocational/Educational  ADL's:  Intact  Cognition:  WNL  Sleep:        Assessment and Plan:     08/10/2022   10:08 AM 05/25/2022   11:42 AM 02/22/2022   11:38 AM 02/09/2022    3:39 PM 01/25/2022   12:28 PM  Depression screen PHQ 2/9  Decreased Interest 0 1 0 0 1  Down, Depressed, Hopeless 0 1 0 0 0  PHQ - 2 Score 0 2 0 0 1  Altered sleeping  1 0 0 0  Tired, decreased energy  3 1 3 1   Change in appetite  0 0 0 1  Feeling bad or failure about yourself   1 1 0 1  Trouble concentrating  1 1 3 1   Moving slowly or fidgety/restless  0 0 0 0  Suicidal thoughts  0 0 0 0  PHQ-9 Score  8 3 6 5   Difficult doing work/chores  Somewhat difficult   Not difficult at all    Flowsheet Row [facility]/10/[facility] Video Visit from 05[facility]25/2022 in [facility] Admission (Discharged) from 05/23/2022 in [facility] 1S Maternity Assessment Unit  C-SSRS RISK CATEGORY No Risk No Risk No Risk  Pt is aware that these meds carry a teratogenic risk. Pt will discuss plan of action if she does or plans to become pregnant in the future. She is not breast feeding.   Status of current problems: anxiety is significantly improved. She is going to start work again in  2weeks and wants to restart Adderall at a lower dose   Medication management with supportive therapy. Risks and benefits, side effects and alternative treatment options discussed with patient. Pt was given an opportunity to ask questions about medication, illness, and treatment. All current psychiatric medications have been reviewed and discussed with the patient and adjusted as clinically appropriate.  Pt verbalized understanding and verbal  consent obtained for treatment.  Meds: Start Adderal XR 15mg  po qD 1. ADD (attention deficit disorder) without hyperactivity - amphetamine-dextroamphetamine (ADDERALL XR) 15 MG 24 hr capsule; Take 1 capsule by mouth daily.  Dispense: 30 capsule; Refill: 0  2. GAD (generalized anxiety disorder) - buPROPion (WELLBUTRIN XL) 150 MG 24 hr tablet; Take 1 tablet (150 mg total) by mouth every morning.  Dispense: 90 tablet; Refill: 0 - hydrOXYzine (ATARAX) 10 MG tablet; Take 1 tablet (10 mg total) by mouth 2 (two) times daily as needed.  Dispense: 90 tablet; Refill: 0  3. Tourette disease  4. Panic attacks - hydrOXYzine (ATARAX) 10 MG tablet; Take 1 tablet (10 mg total) by mouth 2 (two) times daily as needed.  Dispense: 90 tablet; Refill: 0     Labs: none today    Therapy: brief supportive therapy provided. Discussed psychosocial stressors in detail.     Collaboration of Care: Other none  Patient/Guardian was advised Release of Information must be obtained prior to any record release in order to collaborate their care with an outside provider. Patient/Guardian was advised if they have not already done so to contact the registration department to sign all necessary forms in order for [place] to release information regarding their care.   Consent: Patient/Guardian gives verbal consent for treatment and assignment of benefits for services provided during this visit. Patient/Guardian expressed understanding and agreed to proceed.       Follow Up Instructions: Follow up in 1 month or sooner if needed    I discussed the assessment and treatment plan with the patient. The patient was provided an opportunity to ask questions and all were answered. The patient agreed with the plan and demonstrated an understanding of the instructions.   The patient was advised to call back or seek an in-person evaluation if the symptoms worsen or if the condition fails to improve as anticipated.  I provided 11  minutes of non-face-to-face time during this encounter.   Oletta Darter, MD

## 2022-08-11 ENCOUNTER — Encounter: Payer: Self-pay | Admitting: Podiatry

## 2022-08-11 ENCOUNTER — Ambulatory Visit (INDEPENDENT_AMBULATORY_CARE_PROVIDER_SITE_OTHER): Payer: 59 | Admitting: Podiatry

## 2022-08-11 ENCOUNTER — Other Ambulatory Visit (HOSPITAL_BASED_OUTPATIENT_CLINIC_OR_DEPARTMENT_OTHER): Payer: Self-pay

## 2022-08-11 DIAGNOSIS — B351 Tinea unguium: Secondary | ICD-10-CM | POA: Diagnosis not present

## 2022-08-11 DIAGNOSIS — B353 Tinea pedis: Secondary | ICD-10-CM | POA: Diagnosis not present

## 2022-08-11 MED ORDER — KETOCONAZOLE 2 % EX CREA
1.0000 | TOPICAL_CREAM | Freq: Every day | CUTANEOUS | 2 refills | Status: DC
  Filled 2022-08-11 – 2022-10-18 (×2): qty 60, 30d supply, fill #0

## 2022-08-11 NOTE — Progress Notes (Signed)
  Subjective:  Patient ID: Cindy Jones, female    DOB: June 19, 1990,   MRN: [id number]  Chief Complaint  Patient presents with   Nail Problem    Bilateral nail fungus     32 y.o. female presents for concern of bilateral nail fungus and dry itching feet. Relates this started after a trip prior to being pregnant and recently has worsened and affecting all of her toes. Denies any current treatment but recently had her baby and knew she couldn't be on anything . Denies any other pedal complaints. Denies n/v/f/c.   Past Medical History:  Diagnosis Date   Anxiety    Asthma    as a child   Binge eating disorder    Dysrhythmia    " I feel it about once a month"   Frequent headaches    GAD (generalized anxiety disorder)    GERD (gastroesophageal reflux disease)    History of acute PID 06/09/2019   Major depression in complete remission    Migraines    PID (acute pelvic inflammatory disease)    Seasonal allergies    Tourette disorder    Urinary tract bacterial infections     Objective:  Physical Exam: Vascular: DP/PT pulses 2/4 bilateral. CFT <3 seconds. Normal hair growth on digits. No edema.  Skin. No lacerations or abrasions bilateral feet. Nails 2-5 bilateral are thickened discolored brittle and with subunga debris. Left great toenail absent. Relates it fell off a while back.  Musculoskeletal: MMT 5/5 bilateral lower extremities in DF, PF, Inversion and Eversion. Deceased ROM in DF of ankle joint.  Neurological: Sensation intact to light touch.   Assessment:   1. Onychomycosis   2. Tinea pedis of both feet      Plan:  Patient was evaluated and treated and all questions answered. -Examined patient -Discussed treatment options for painful dystrophic nails  -Clinical picture and Fungal culture was obtained by removing a portion of the hard nail itself from each of the involved toenails using a sterile nail nipper and sent to [facility] lab. Patient tolerated the biopsy procedure well  without discomfort or need for anesthesia.  -Discussed fungal nail treatment options including oral, topical, and laser treatments.  -Discussed using urea nail gel for time being.  -Ketoconazole sent for tinea pedis.  -Patient to return in 4 weeks for follow up evaluation and discussion of fungal culture results or sooner if symptoms worsen.   Louann Sjogren, DPM

## 2022-08-11 NOTE — Addendum Note (Signed)
Addended by: Hedy Jacob on: 08/11/2022 01:03 PM   Modules accepted: Orders

## 2022-08-17 DIAGNOSIS — F411 Generalized anxiety disorder: Secondary | ICD-10-CM | POA: Diagnosis not present

## 2022-08-21 ENCOUNTER — Other Ambulatory Visit (HOSPITAL_BASED_OUTPATIENT_CLINIC_OR_DEPARTMENT_OTHER): Payer: Self-pay

## 2022-08-31 ENCOUNTER — Telehealth (HOSPITAL_BASED_OUTPATIENT_CLINIC_OR_DEPARTMENT_OTHER): Payer: 59 | Admitting: Psychiatry

## 2022-08-31 ENCOUNTER — Other Ambulatory Visit (HOSPITAL_BASED_OUTPATIENT_CLINIC_OR_DEPARTMENT_OTHER): Payer: Self-pay

## 2022-08-31 DIAGNOSIS — F952 Tourette's disorder: Secondary | ICD-10-CM

## 2022-08-31 DIAGNOSIS — F411 Generalized anxiety disorder: Secondary | ICD-10-CM

## 2022-08-31 DIAGNOSIS — F988 Other specified behavioral and emotional disorders with onset usually occurring in childhood and adolescence: Secondary | ICD-10-CM

## 2022-08-31 DIAGNOSIS — F410 Panic disorder [episodic paroxysmal anxiety] without agoraphobia: Secondary | ICD-10-CM | POA: Diagnosis not present

## 2022-08-31 DIAGNOSIS — F502 Bulimia nervosa: Secondary | ICD-10-CM

## 2022-08-31 MED ORDER — AMPHETAMINE-DEXTROAMPHET ER 20 MG PO CP24
20.0000 mg | ORAL_CAPSULE | Freq: Every day | ORAL | 0 refills | Status: DC
Start: 2022-08-31 — End: 2022-12-04
  Filled 2022-08-31 – 2022-10-02 (×2): qty 30, 30d supply, fill #0

## 2022-08-31 MED ORDER — AMPHETAMINE-DEXTROAMPHET ER 20 MG PO CP24
20.0000 mg | ORAL_CAPSULE | Freq: Every day | ORAL | 0 refills | Status: DC
Start: 2022-08-31 — End: 2022-12-04
  Filled 2022-08-31 – 2022-11-03 (×2): qty 30, 30d supply, fill #0

## 2022-08-31 MED ORDER — BUPROPION HCL ER (XL) 150 MG PO TB24
150.0000 mg | ORAL_TABLET | ORAL | 0 refills | Status: DC
Start: 2022-08-31 — End: 2022-12-28
  Filled 2022-08-31 – 2022-10-18 (×2): qty 90, 90d supply, fill #0

## 2022-08-31 MED ORDER — AMPHETAMINE-DEXTROAMPHET ER 20 MG PO CP24
20.0000 mg | ORAL_CAPSULE | Freq: Every day | ORAL | 0 refills | Status: DC
Start: 2022-08-31 — End: 2022-12-04
  Filled 2022-08-31 (×2): qty 30, 30d supply, fill #0

## 2022-08-31 NOTE — Progress Notes (Signed)
Virtual Visit via Video Note  I connected with Cindy Jones on 08/31/22 at  9:15 AM EDT by a video enabled telemedicine application and verified that I am speaking with the correct person using two identifiers.  Location: Patient: home Provider: office   I discussed the limitations of evaluation and management by telemedicine and the availability of in person appointments. The patient expressed understanding and agreed to proceed.  History of Present Illness: Cindy Jones was laid off unexpectedly last week. It was a shock but she was given 10 weeks of severance.Cindy Jones has filed for unemployment.  She has applied to several place and has interviewed. Her anxiety is higher but manageable. She had a little panic attack when she was laid off but denies any since then. The Vistaril makes her tired but doesn't help much with anxiety. It has been difficult to work from home, as her son demands her attention even though they have a nanny. She wants to find a hybrid position. She denies depression, isolation and hopelessness. She denies SI/HI. Cindy Jones has been studying a little to brush up on her skills. The Adderall at 15mg  does not help enough and thinks the dose needs to be higher. The effect lasts for about 6 hrs. Her appetite is good. She denies irritability and headaches with Adderall use. Her tics have worsened due to stress but do not affect her sleep. Her sleep is good and she is getting about 6hrs/night. Cindy Jones has been going to the gym and doing other things for self care.  Cindy Jones has a therapy appointment next week. The Wellbutrin seems to be helping her mood and anxiety. She is denying SE and wants to continue it.     Observations/Objective: Psychiatric Specialty Exam: ROS  not currently breastfeeding.There is no height or weight on file to calculate BMI.  General Appearance: Neat and Well Groomed  Eye Contact:  Good  Speech:  Clear and Coherent and Normal Rate  Volume:  Normal  Mood:  Euthymic   Affect:  Full Range  Thought Process:  Goal Directed, Linear, and Descriptions of Associations: Intact  Orientation:  Full (Time, Place, and Person)  Thought Content:  Logical  Suicidal Thoughts:  No  Homicidal Thoughts:  No  Memory:  Immediate;   Good  Judgement:  Good  Insight:  Good  Psychomotor Activity:  Normal  Concentration:  Concentration: Good  Recall:  Good  Fund of Knowledge:  Good  Language:  Good  Akathisia:  No  Handed:  Right  AIMS (if indicated):     Assets:  Communication Skills Desire for Improvement Financial Resources/Insurance Housing Intimacy Leisure Time Resilience Social Support Talents/Skills Transportation Vocational/Educational  ADL's:  Intact  Cognition:  WNL  Sleep:        Assessment and Plan:     08/31/2022    9:38 AM 08/10/2022   10:08 AM 05/25/2022   11:42 AM 02/22/2022   11:38 AM 02/09/2022    3:39 PM  Depression screen PHQ 2/9  Decreased Interest 0 0 1 0 0  Down, Depressed, Hopeless 0 0 1 0 0  PHQ - 2 Score 0 0 2 0 0  Altered sleeping   1 0 0  Tired, decreased energy   3 1 3   Change in appetite   0 0 0  Feeling bad or failure about yourself    1 1 0  Trouble concentrating   1 1 3   Moving slowly or fidgety/restless   0 0 0  Suicidal thoughts  0 0 0  PHQ-9 Score   8 3 6   Difficult doing work/chores   Somewhat difficult      Flowsheet Row Video Visit from 08/31/2022 in [facility] Video Visit from 08[facility]10/2022 in [facility] Video Visit from 05/25/2022 in [facility]  C-SSRS RISK CATEGORY No Risk No Risk No Risk          Pt is aware that these meds carry a teratogenic risk. Pt will discuss plan of action if she does or plans to become pregnant in the future.  Status of current problems: ADHD is not well controlled with low dose of Adderall, increased anxiety due to recent firing   Medication management with  supportive therapy. Risks and benefits, side effects and alternative treatment options discussed with patient. Pt was given an opportunity to ask questions about medication, illness, and treatment. All current psychiatric medications have been reviewed and discussed with the patient and adjusted as clinically appropriate.  Pt verbalized understanding and verbal consent obtained for treatment.  Meds: d/c Vistaril Increase Adderall 20mg  qD for ADHD Continue Wellbutrin XL 150mg  po qD Continue NAC for anxiety Pt is not breastfeeding 1. ADD (attention deficit disorder) without hyperactivity - amphetamine-dextroamphetamine (ADDERALL XR) 20 MG 24 hr capsule; Take 1 capsule (20 mg total) by mouth daily.  Dispense: 30 capsule; Refill: 0 - amphetamine-dextroamphetamine (ADDERALL XR) 20 MG 24 hr capsule; Take 1 capsule (20 mg total) by mouth daily.  Dispense: 30 capsule; Refill: 0 - amphetamine-dextroamphetamine (ADDERALL XR) 20 MG 24 hr capsule; Take 1 capsule (20 mg total) by mouth daily.  Dispense: 30 capsule; Refill: 0  2. Tourette disease  3. GAD (generalized anxiety disorder) - buPROPion (WELLBUTRIN XL) 150 MG 24 hr tablet; Take 1 tablet (150 mg total) by mouth every morning.  Dispense: 90 tablet; Refill: 0  4. Panic attacks  5. Bulimia nervosa     Therapy: brief supportive therapy provided. Discussed psychosocial stressors in detail.    Collaboration of Care: Other encouraged to continue therapy with [facility] for postpartum anxiety  Patient/Guardian was advised Release of Information must be obtained prior to any record release in order to collaborate their care with an outside provider. Patient/Guardian was advised if they have not already done so to contact the registration department to sign all necessary forms in order for [place] to release information regarding their care.   Consent: Patient/Guardian gives verbal consent for treatment and assignment of benefits for services provided  during this visit. Patient/Guardian expressed understanding and agreed to proceed.      Follow Up Instructions: Follow up in 3 months or sooner if needed  Patient informed that I am leaving [facility] in 10/2022 and I relayed that they will be getting a new provider after that. Patient verbalized understanding and agreed with the plan.     I discussed the assessment and treatment plan with the patient. The patient was provided an opportunity to ask questions and all were answered. The patient agreed with the plan and demonstrated an understanding of the instructions.   The patient was advised to call back or seek an in-person evaluation if the symptoms worsen or if the condition fails to improve as anticipated.  I provided 22 minutes of non-face-to-face time during this encounter.   Oletta Darter, MD

## 2022-09-04 ENCOUNTER — Encounter (HOSPITAL_COMMUNITY): Payer: Self-pay

## 2022-09-07 ENCOUNTER — Telehealth (HOSPITAL_COMMUNITY): Payer: 59 | Admitting: Psychiatry

## 2022-09-07 DIAGNOSIS — F411 Generalized anxiety disorder: Secondary | ICD-10-CM | POA: Diagnosis not present

## 2022-09-08 ENCOUNTER — Ambulatory Visit: Payer: 59 | Admitting: Podiatry

## 2022-09-08 ENCOUNTER — Other Ambulatory Visit (HOSPITAL_COMMUNITY): Payer: Self-pay

## 2022-09-08 ENCOUNTER — Encounter: Payer: Self-pay | Admitting: Podiatry

## 2022-09-08 DIAGNOSIS — B351 Tinea unguium: Secondary | ICD-10-CM

## 2022-09-08 DIAGNOSIS — B353 Tinea pedis: Secondary | ICD-10-CM

## 2022-09-08 MED ORDER — TERBINAFINE HCL 250 MG PO TABS
250.0000 mg | ORAL_TABLET | Freq: Every day | ORAL | 0 refills | Status: AC
  Filled 2022-09-08 – 2022-09-13 (×2): qty 90, 90d supply, fill #0

## 2022-09-08 NOTE — Progress Notes (Signed)
  Subjective:  Patient ID: Cindy Jones, female    DOB: [birth year],   MRN: [id number]  Chief Complaint  Patient presents with   Nail Problem    Bilateral nail problem     32 y.o. female presents for follow-up of bilateral nail fungus and dry itching feet. Realtes she was unable to get the ketoconazole and has not started. Here to go over culture results.  . Denies any other pedal complaints. Denies n/v/f/c.   Past Medical History:  Diagnosis Date   Anxiety    Asthma    as a child   Binge eating disorder    Dysrhythmia    " I feel it about once a month"   Frequent headaches    GAD (generalized anxiety disorder)    GERD (gastroesophageal reflux disease)    History of acute PID 06/09/2019   Major depression in complete remission (HCC)    Migraines    PID (acute pelvic inflammatory disease)    Seasonal allergies    Tourette disorder    Urinary tract bacterial infections     Objective:  Physical Exam: Vascular: DP/PT pulses 2/4 bilateral. CFT <3 seconds. Normal hair growth on digits. No edema.  Skin. No lacerations or abrasions bilateral feet. Nails 2-5 bilateral are thickened discolored brittle and with subunga debris. Left great toenail absent. Relates it fell off a while back.  Musculoskeletal: MMT 5/5 bilateral lower extremities in DF, PF, Inversion and Eversion. Deceased ROM in DF of ankle joint.  Neurological: Sensation intact to light touch.   Assessment:   1. Onychomycosis   2. Tinea pedis of both feet       Plan:  Patient was evaluated and treated and all questions answered. -Examined patient -Discussed treatment options for painful dystrophic nails  -Culture positive for fungus as well as trauma to the nails.  -Discussed fungal nail treatment options including oral, topical, and laser treatments.  -Will opt for lamisil. LFTS from January within normal limits.  -Continue urea and ketoconazole.  -Patient to return in 3 months for nail check.    Louann Sjogren, DPM

## 2022-09-13 ENCOUNTER — Other Ambulatory Visit (HOSPITAL_BASED_OUTPATIENT_CLINIC_OR_DEPARTMENT_OTHER): Payer: Self-pay

## 2022-09-13 DIAGNOSIS — F411 Generalized anxiety disorder: Secondary | ICD-10-CM | POA: Diagnosis not present

## 2022-09-16 ENCOUNTER — Other Ambulatory Visit (HOSPITAL_COMMUNITY): Payer: Self-pay

## 2022-10-02 ENCOUNTER — Other Ambulatory Visit (HOSPITAL_BASED_OUTPATIENT_CLINIC_OR_DEPARTMENT_OTHER): Payer: Self-pay

## 2022-10-02 ENCOUNTER — Other Ambulatory Visit: Payer: Self-pay

## 2022-10-03 ENCOUNTER — Ambulatory Visit (HOSPITAL_BASED_OUTPATIENT_CLINIC_OR_DEPARTMENT_OTHER): Payer: 59 | Admitting: Psychiatry

## 2022-10-03 ENCOUNTER — Encounter (HOSPITAL_COMMUNITY): Payer: Self-pay | Admitting: Psychiatry

## 2022-10-03 VITALS — Wt 210.0 lb

## 2022-10-03 DIAGNOSIS — F502 Bulimia nervosa: Secondary | ICD-10-CM

## 2022-10-03 DIAGNOSIS — F952 Tourette's disorder: Secondary | ICD-10-CM

## 2022-10-03 DIAGNOSIS — F411 Generalized anxiety disorder: Secondary | ICD-10-CM

## 2022-10-03 DIAGNOSIS — F410 Panic disorder [episodic paroxysmal anxiety] without agoraphobia: Secondary | ICD-10-CM | POA: Diagnosis not present

## 2022-10-03 DIAGNOSIS — F988 Other specified behavioral and emotional disorders with onset usually occurring in childhood and adolescence: Secondary | ICD-10-CM

## 2022-10-03 NOTE — Progress Notes (Signed)
[name]   Patient Location: Home Provider Location: Home Office  I connect with patient by video and verified that I am speaking with correct person by using two identifiers. I discussed the limitations of evaluation and management by telemedicine and the availability of in person appointments. I also discussed with the patient that there may be a patient responsible charge related to this service. The patient expressed understanding and agreed to proceed.  Cindy Jones [id number] 32 y.o.  10/03/2022 9:25 AM  History of Present Illness:  Patient is 32 year old Caucasian, married female who is seeing Dr. Alena Bills for 9 years like to establish care with this Clinical research associate as Dr. Michae Kava is leaving the practice.  Patient has a history of ADHD, Tourette's, anxiety and bulimia nervosa.  She reported her symptoms are stable on Adderall 20 mg daily and now she also takes the Wellbutrin XL 150 which is started after postpartum anxiety in March.  Patient had a baby in January who is now almost 93 months old.  Patient has a good support from her husband who is a Engineer, civil (consulting) in ICU.  She also very close to her aunt and uncle who raised her.  Patient also seeing a [facility].  She reported her focus, attention is good.  She also has Potts.  She reported but takes and bulimia is under control and denies any recent episode of purging, restricting.  She had gained weight during pregnancy and trying to watch her calorie intake.  She does go to walk and she does stationary bike.  She denies any tremors, shakes, EPS.  She denies any crying spells or any feeling of hopelessness or worthlessness.  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  Her job will be hybrid.  She is happy about it.  She sleeps good.  She denies any paranoia or any hallucination.  She  sees her therapist on a regular basis which is helping her coping skills.  She denies any recent major panic attack.  She was given enough refills until July.  She denies drinking or using any illegal substances.  Patient has strong family history of addiction, depression.  Her twin sister has depression, mother has mental disorder.  Patient has a distant relationship with her mother.  She is very close to her father's side of the family.  Past Psychiatric History: Started seeing Dr. Michae Kava in 2015.  Had tried Ritalin that made angry and Vyvanse did not work.  Started taking medication since age 56 until age 4 when she got pregnant.  Off the medication because of pregnancy and restarted in 2019.  Has seen psychiatrist in [place], [place] when she was in the school.  No history of psychosis, mania, suicidal thoughts.  At diagnosis of bulimia nervosa, Tourette with tics, anxiety and depression.  No history of illegal drug use, PTSD.  On Adderall XR 20 mg for many years but given 5 mg IR in the school.  Wellbutrin started after the pregnancy this year after having postpartum anxiety.  Psychosocial history; Patient lives with her husband who is a Engineer, civil (consulting) in ICU, [place] [facility].  Recently had a child who is 4-1/2 months old.  Patient born in [place].  Moved to [place] for boarding school.  Father died when she was only 4 years old.  Mother had  history of mental disorder, and addiction and patient is close to her aunt and uncle who raised her.  Patient has a distant relationship with the mother.  Patient has a twin sister who has depression.   Outpatient Encounter Medications as of 10/03/2022  Medication Sig   Acetylcysteine (NAC) 600 MG CAPS Take 1 capsule (600 mg total) by mouth daily in the afternoon.   amphetamine-dextroamphetamine (ADDERALL XR) 20 MG 24 hr capsule Take 1 capsule (20 mg total) by mouth daily.   amphetamine-dextroamphetamine (ADDERALL XR) 20 MG 24 hr capsule Take 1 capsule (20  mg total) by mouth daily.   amphetamine-dextroamphetamine (ADDERALL XR) 20 MG 24 hr capsule Take 1 capsule (20 mg total) by mouth daily.   buPROPion (WELLBUTRIN XL) 150 MG 24 hr tablet Take 1 tablet (150 mg total) by mouth every morning.   cholecalciferol (VITAMIN D3) 25 MCG (1000 UNIT) tablet Take 1,000 Units by mouth daily.   folic acid (FOLVITE) 1 MG tablet Take 1 mg by mouth daily.   ketoconazole (NIZORAL) 2 % cream Apply 1 Application topically daily.   terbinafine (LAMISIL) 250 MG tablet Take 1 tablet (250 mg total) by mouth daily.   No facility-administered encounter medications on file as of 10/03/2022.    No results found for this or any previous visit (from the past 2160 hour(s)).   Psychiatric Specialty Exam: Physical Exam  Review of Systems  Weight 210 lb (95.3 kg), not currently breastfeeding.There is no height or weight on file to calculate BMI.  General Appearance: Well Groomed  Eye Contact:  Good  Speech:  Normal Rate  Volume:  Normal  Mood:  Anxious  Affect:  Appropriate  Thought Process:  Goal Directed  Orientation:  Full (Time, Place, and Person)  Thought Content:  WDL  Suicidal Thoughts:  No  Homicidal Thoughts:  No  Memory:  Immediate;   Good Recent;   Good Remote;   Good  Judgement:  Good  Insight:  Good  Psychomotor Activity:  Normal  Concentration:  Concentration: Good and Attention Span: Good  Recall:  Good  Fund of Knowledge:  Good  Language:  Good  Akathisia:  No  Handed:  Right  AIMS (if indicated):     Assets:  Communication Skills Desire for Improvement Intimacy Resilience Social Support Talents/Skills Transportation  ADL's:  Intact  Cognition:  WNL  Sleep:  ok     Assessment/Plan: ADD (attention deficit disorder) without hyperactivity  GAD (generalized anxiety disorder)  Tourette disease  Panic attacks  Bulimia nervosa  I reviewed collateral information from electronic medical record, review history, psychosocial, recent  blood work results, current medication.  Patient is a stable on current dose of Adderall XR 20 mg daily and Wellbutrin XL 150 mg in the morning.  Her anxiety is better.  She does regular exercise.  She has not involved in any recent episode of purging or restriction.  She is a therapist who she sees regularly which is helping her coping skills.  Patient has good support from her husband and aunt and uncle.  She denies drinking or using any illegal substances.  She has enough refill until the end of July.  I discussed in detail about stimulant abuse, tolerance, withdrawal, interaction with other medication.  Given the history of Tourette and bulimia, discussed interaction and possible side effects.  Patient acknowledged and has been taking these medication for a while with no major concerns.  We will continue current medication.  Recommend to call us back if  she has any question, concern or if she feels worsening of the symptoms.  We will follow-up in 2 months.   Follow Up Instructions:     I discussed the assessment and treatment plan with the patient. The patient was provided an opportunity to ask questions and all were answered. The patient agreed with the plan and demonstrated an understanding of the instructions.   The patient was advised to call back or seek an in-person evaluation if the symptoms worsen or if the condition fails to improve as anticipated.    Collaboration of Care: Other provider involved in patient's care AEB notes are available in epic to review.  Patient/Guardian was advised Release of Information must be obtained prior to any record release in order to collaborate their care with an outside provider. Patient/Guardian was advised if they have not already done so to contact the registration department to sign all necessary forms in order for [place] to release information regarding their care.   Consent: Patient/Guardian gives verbal consent for treatment and assignment of benefits  for services provided during this visit. Patient/Guardian expressed understanding and agreed to proceed.     I provided 44 minutes of non face to face time during this encounter.  Note: This document was prepared by [employer] voice dictation technology and any errors that results from this process are unintentional.    Cleotis Nipper, MD 10/03/2022

## 2022-10-05 DIAGNOSIS — F411 Generalized anxiety disorder: Secondary | ICD-10-CM | POA: Diagnosis not present

## 2022-10-18 ENCOUNTER — Other Ambulatory Visit (HOSPITAL_BASED_OUTPATIENT_CLINIC_OR_DEPARTMENT_OTHER): Payer: Self-pay

## 2022-11-03 ENCOUNTER — Other Ambulatory Visit (HOSPITAL_BASED_OUTPATIENT_CLINIC_OR_DEPARTMENT_OTHER): Payer: Self-pay

## 2022-11-03 ENCOUNTER — Other Ambulatory Visit: Payer: Self-pay

## 2022-11-13 ENCOUNTER — Encounter: Payer: Self-pay | Admitting: Family Medicine

## 2022-11-13 ENCOUNTER — Ambulatory Visit (INDEPENDENT_AMBULATORY_CARE_PROVIDER_SITE_OTHER): Payer: 59 | Admitting: Family Medicine

## 2022-11-13 VITALS — BP 122/88 | HR 73 | Temp 97.9°F | Ht 65.35 in | Wt 218.4 lb

## 2022-11-13 DIAGNOSIS — Z0000 Encounter for general adult medical examination without abnormal findings: Secondary | ICD-10-CM

## 2022-11-13 DIAGNOSIS — Z13220 Encounter for screening for lipoid disorders: Secondary | ICD-10-CM | POA: Diagnosis not present

## 2022-11-13 LAB — CBC WITH DIFFERENTIAL/PLATELET
Basophils Absolute: 0 10*3/uL (ref 0.0–0.1)
Basophils Relative: 0.8 % (ref 0.0–3.0)
Eosinophils Absolute: 0.1 10*3/uL (ref 0.0–0.7)
Eosinophils Relative: 1.7 % (ref 0.0–5.0)
HCT: 38.1 % (ref 36.0–46.0)
Hemoglobin: 12.7 g/dL (ref 12.0–15.0)
Lymphocytes Relative: 24.8 % (ref 12.0–46.0)
Lymphs Abs: 1.3 10*3/uL (ref 0.7–4.0)
MCHC: 33.3 g/dL (ref 30.0–36.0)
MCV: 90.6 fl (ref 78.0–100.0)
Monocytes Absolute: 0.5 10*3/uL (ref 0.1–1.0)
Monocytes Relative: 9.5 % (ref 3.0–12.0)
Neutro Abs: 3.4 10*3/uL (ref 1.4–7.7)
Neutrophils Relative %: 63.2 % (ref 43.0–77.0)
Platelets: 209 10*3/uL (ref 150.0–400.0)
RBC: 4.2 Mil/uL (ref 3.87–5.11)
RDW: 14.3 % (ref 11.5–15.5)
WBC: 5.4 10*3/uL (ref 4.0–10.5)

## 2022-11-13 LAB — TSH: TSH: 1.39 u[IU]/mL (ref 0.35–5.50)

## 2022-11-13 NOTE — Patient Instructions (Addendum)
Return in about 1 year (around 11/14/2023) for cpe (20 min).        Great to see you today.  I have refilled the medication(s) we provide.   If labs were collected, we will inform you of lab results once received either by echart message or telephone call.   - echart message- for normal results that have been seen by the patient already.   - telephone call: abnormal results or if patient has not viewed results in their echart.

## 2022-11-13 NOTE — Progress Notes (Signed)
Patient ID: Cindy Jones, female  DOB: 1990/10/04, 32 y.o.   MRN: [id number] Patient Care Team    Relationship Specialty Notifications Start End  Natalia Leatherwood, DO PCP - General Family Medicine  11/10/22   Karie Soda, MD Consulting Physician General Surgery  01/31/16   Armbruster, Willaim Rayas, MD Consulting Physician Gastroenterology  01/31/16   Reva Bores, MD Consulting Physician Obstetrics and Gynecology  10/27/19   Drema Dallas, DO Consulting Physician Neurology  08/09/20   Kathrynn Running, MD Consulting Physician Obstetrics and Gynecology  05/04/22   Venora Maples, MD Consulting Physician Family Medicine  11/13/22   Thomasene Ripple, DO Consulting Physician Cardiology  11/13/22     Chief Complaint  Patient presents with   Annual Exam    Pt is fasting    Subjective: Cindy Jones is a 32 y.o.  Female  present for CPE. All past medical history, surgical history, allergies, family history, immunizations, medications and social history were updated in the electronic medical record today. All recent labs, ED visits and hospitalizations within the last year were reviewed.  Health maintenance:  Colonoscopy:routine screen at 47,  EGD completed 12/2015 Dr. Adela Lank Mammogram: No Fhx screen at 40 Cervical cancer screening: last pap:10/2022, GYN Immunizations: tdap UTD 2017, Influenza  (encouraged yearly) Infectious disease screening: HIV completed 2015, hep c completed DEXA: N/A Patient has a Dental home. Hospitalizations/ED visits: reviewed     11/13/2022    9:21 AM 08/31/2022    9:38 AM 08/10/2022   10:08 AM 05/25/2022   11:42 AM 02/22/2022   11:38 AM  Depression screen PHQ 2/9  Decreased Interest 0    0  Down, Depressed, Hopeless 0    0  PHQ - 2 Score 0    0  Altered sleeping 0    0  Tired, decreased energy 1    1  Change in appetite 0    0  Feeling bad or failure about yourself  0    1  Trouble concentrating 0    1  Moving slowly or fidgety/restless 0    0   Suicidal thoughts 0    0  PHQ-9 Score 1    3  Difficult doing work/chores          Information is confidential and restricted. Go to Review Flowsheets to unlock data.      11/13/2022    9:21 AM 02/22/2022   11:38 AM 01/25/2022   12:29 PM 12/29/2021    4:14 PM  GAD 7 : Generalized Anxiety Score  Nervous, Anxious, on Edge 1 1 2 1   Control/stop worrying 0 0 1 1  Worry too much - different things 0 0 1 0  Trouble relaxing 1 1 1 1   Restless 0 0 1 1  Easily annoyed or irritable 0 1 0 0  Afraid - awful might happen 0 0 0 1  Total GAD 7 Score 2 3 6 5   Anxiety Difficulty   Not difficult at all            Immunization History  Administered Date(s) Administered   Influenza Inj Mdck Quad With Preservative 03/31/2017, 02/08/2018   Influenza,inj,Quad PF,6+ Mos 06/08/2014   Influenza-Unspecified 02/24/2015, 02/05/2018, 02/18/2019, 05/07/2020, 01/24/2022   Moderna Sars-Covid-2 Vaccination 05/07/2020   PFIZER(Purple Top)SARS-COV-2 Vaccination 07/21/2019, 08/11/2019   Pfizer Covid-19 Vaccine Bivalent Booster 34yrs & up 02/16/2022   Rsv, Bivalent, Protein Subunit Rsvpref,pf Verdis Frederickson) 04/12/2022   Td 07/14/2015  Tdap 05/01/2005, 02/22/2022     Past Medical History:  Diagnosis Date   Anxiety    Asthma    as a child   Binge eating disorder    Dysrhythmia    " I feel it about once a month"   Frequent headaches    GAD (generalized anxiety disorder)    GERD (gastroesophageal reflux disease)    History of acute PID 06/09/2019   Major depression in complete remission (HCC)    Migraines    PID (acute pelvic inflammatory disease)    Seasonal allergies    Tourette disorder    Urinary tract bacterial infections    Allergies  Allergen Reactions   Latex Swelling and Rash   Barley Grass Hives   Cranberry Juice Powder Other (See Comments)    Throat pain.   Naproxen Other (See Comments)    "Liver starts hurting" Diarrhea   Nickel     Skin peeling   Sulfa Antibiotics Hives and  Swelling   Past Surgical History:  Procedure Laterality Date   CHOLECYSTECTOMY     ESOPHAGOGASTRODUODENOSCOPY     LAPAROSCOPIC CHOLECYSTECTOMY SINGLE SITE WITH INTRAOPERATIVE CHOLANGIOGRAM N/A 03/15/2016   Procedure: LAPAROSCOPIC CHOLECYSTECTOMY SINGLE SITE WITH INTRAOPERATIVE CHOLANGIOGRAM;  Surgeon: Karie Soda, MD;  Location: [facility] OR;  Service: General;  Laterality: N/A;   TONSILLECTOMY AND ADENOIDECTOMY  2013   WISDOM TOOTH EXTRACTION     Family History  Problem Relation Age of Onset   Alcohol abuse Mother    Bipolar disorder Mother    Cancer Father        sarcoma; passed away when pt was 8   Asthma Sister    Allergic rhinitis Sister    Eczema Sister    Emphysema Maternal Grandmother    Tourette syndrome Maternal Grandfather    Emphysema Paternal Grandmother    Heart disease Paternal Grandfather    Tourette syndrome Maternal Aunt    Alcohol abuse Maternal Aunt    Alcohol abuse Maternal Uncle    Hyperlipidemia Paternal Aunt    Hashimoto's thyroiditis Paternal Aunt    Suicidality Neg Hx    Urticaria Neg Hx    Angioedema Neg Hx    Social History   Social History Narrative   - Married, no children.   - Automotive engineer education.   - She works FT as a Patent attorney.   - Her aunt & uncle were her guardians when she was in HS. They live in [place], [place].    - She has a fraternal twin sister.    - Wears her seatbelt, smoke detectors in the home.   Right handed    Allergies as of 11/13/2022       Reactions   Latex Swelling, Rash   Barley Grass Hives   Cranberry Juice Powder Other (See Comments)   Throat pain.   Naproxen Other (See Comments)   "Liver starts hurting" Diarrhea   Nickel    Skin peeling   Sulfa Antibiotics Hives, Swelling        Medication List        Accurate as of November 13, 2022  9:27 AM. If you have any questions, ask your nurse or doctor.          amphetamine-dextroamphetamine 20 MG 24 hr capsule Commonly known as: Adderall XR Take 1  capsule (20 mg total) by mouth daily.   amphetamine-dextroamphetamine 20 MG 24 hr capsule Commonly known as: Adderall XR Take 1 capsule (20 mg total) by mouth daily.  amphetamine-dextroamphetamine 20 MG 24 hr capsule Commonly known as: Adderall XR Take 1 capsule (20 mg total) by mouth daily.   buPROPion 150 MG 24 hr tablet Commonly known as: Wellbutrin XL Take 1 tablet (150 mg total) by mouth every morning.   cholecalciferol 25 MCG (1000 UNIT) tablet Commonly known as: VITAMIN D3 Take 1,000 Units by mouth daily.   ketoconazole 2 % cream Commonly known as: NIZORAL Apply 1 Application topically daily.   NAC 600 MG Caps Generic drug: Acetylcysteine Take 1 capsule (600 mg total) by mouth daily in the afternoon.   terbinafine 250 MG tablet Commonly known as: LamISIL Take 1 tablet (250 mg total) by mouth daily.        All past medical history, surgical history, allergies, family history, immunizations andmedications were updated in the EMR today and reviewed under the history and medication portions of their EMR.     ROS: 14 pt review of systems performed and negative (unless mentioned in an HPI)  Objective: BP 122/88   Pulse 73   Temp 97.9 F (36.6 C)   Ht 5' 5.35" (1.66 m)   Wt 218 lb 6.4 oz (99.1 kg)   LMP 10/20/2022   SpO2 100%   Breastfeeding No   BMI 35.95 kg/m  Physical Exam Vitals and nursing note reviewed.  Constitutional:      General: She is not in acute distress.    Appearance: Normal appearance. She is not ill-appearing or toxic-appearing.  HENT:     Head: Normocephalic and atraumatic.     Right Ear: Tympanic membrane, ear canal and external ear normal. There is no impacted cerumen.     Left Ear: Tympanic membrane, ear canal and external ear normal. There is no impacted cerumen.     Nose: No congestion or rhinorrhea.     Mouth/Throat:     Mouth: Mucous membranes are moist.     Pharynx: Oropharynx is clear. No oropharyngeal exudate or posterior  oropharyngeal erythema.  Eyes:     General: No scleral icterus.       Right eye: No discharge.        Left eye: No discharge.     Extraocular Movements: Extraocular movements intact.     Conjunctiva/sclera: Conjunctivae normal.     Pupils: Pupils are equal, round, and reactive to light.  Cardiovascular:     Rate and Rhythm: Normal rate and regular rhythm.     Pulses: Normal pulses.     Heart sounds: Normal heart sounds. No murmur heard.    No friction rub. No gallop.  Pulmonary:     Effort: Pulmonary effort is normal. No respiratory distress.     Breath sounds: Normal breath sounds. No stridor. No wheezing, rhonchi or rales.  Chest:     Chest wall: No tenderness.  Abdominal:     General: Abdomen is flat. Bowel sounds are normal. There is no distension.     Palpations: Abdomen is soft. There is no mass.     Tenderness: There is no abdominal tenderness. There is no right CVA tenderness, left CVA tenderness, guarding or rebound.     Hernia: No hernia is present.  Musculoskeletal:        General: No swelling, tenderness or deformity. Normal range of motion.     Cervical back: Normal range of motion and neck supple. No rigidity or tenderness.     Right lower leg: No edema.     Left lower leg: No edema.  Lymphadenopathy:  Cervical: No cervical adenopathy.  Skin:    General: Skin is warm and dry.     Coloration: Skin is not jaundiced or pale.     Findings: No bruising, erythema, lesion or rash.  Neurological:     General: No focal deficit present.     Mental Status: She is alert and oriented to person, place, and time. Mental status is at baseline.     Cranial Nerves: No cranial nerve deficit.     Sensory: No sensory deficit.     Motor: No weakness.     Coordination: Coordination normal.     Gait: Gait normal.     Deep Tendon Reflexes: Reflexes normal.  Psychiatric:        Mood and Affect: Mood normal.        Behavior: Behavior normal.        Thought Content: Thought content  normal.        Judgment: Judgment normal.     No results found.  Assessment/plan: Cindy Jones is a 32 y.o. female present for annual physical Routine general medical examination at a health care facility Patient was encouraged to exercise greater than 150 minutes a week. Patient was encouraged to choose a diet filled with fresh fruits and vegetables, and lean meats. AVS provided to patient today for education/recommendation on gender specific health and safety maintenance. Colonoscopy:routine screen at 60,  EGD completed 12/2015 Dr. Adela Lank Mammogram: No Fhx screen at 40 Cervical cancer screening: last pap:10/2022, GYN Immunizations: tdap UTD 2017, Influenza  (encouraged yearly) Infectious disease screening: HIV completed 2015, hep c completed DEXA: N/A   Return in about 1 year (around 11/14/2023) for cpe (20 min).  Orders Placed This Encounter  Procedures   CBC with Differential/Platelet   Comprehensive metabolic panel   TSH   Lipid panel   No orders of the defined types were placed in this encounter.  Referral Orders  No referral(s) requested today     Electronically signed by: Felix Pacini, DO [facility] [place]

## 2022-11-14 ENCOUNTER — Telehealth: Payer: Self-pay | Admitting: Family Medicine

## 2022-11-14 ENCOUNTER — Telehealth: Payer: Self-pay

## 2022-11-14 DIAGNOSIS — R7401 Elevation of levels of liver transaminase levels: Secondary | ICD-10-CM

## 2022-11-14 LAB — COMPREHENSIVE METABOLIC PANEL
ALT: 23 U/L (ref 0–35)
AST: 66 U/L — ABNORMAL HIGH (ref 0–37)
Albumin: 4.4 g/dL (ref 3.5–5.2)
Alkaline Phosphatase: 46 U/L (ref 39–117)
BUN: 13 mg/dL (ref 6–23)
CO2: 21 mEq/L (ref 19–32)
Calcium: 9.1 mg/dL (ref 8.4–10.5)
Chloride: 105 mEq/L (ref 96–112)
Creatinine, Ser: 0.78 mg/dL (ref 0.40–1.20)
GFR: 100.81 mL/min (ref >60.00)
Glucose, Bld: 84 mg/dL (ref 70–99)
Potassium: 4.2 mEq/L (ref 3.5–5.1)
Sodium: 137 mEq/L (ref 135–145)
Total Bilirubin: 0.6 mg/dL (ref 0.2–1.2)
Total Protein: 6.9 g/dL (ref 6.0–8.3)

## 2022-11-14 LAB — LIPID PANEL
Cholesterol: 140 mg/dL (ref 0–200)
HDL: 50.9 mg/dL (ref >39.00)
LDL Cholesterol: 74 mg/dL (ref 0–99)
NonHDL: 88.96
Total CHOL/HDL Ratio: 3
Triglycerides: 74 mg/dL (ref 0.0–149.0)
VLDL: 14.8 mg/dL (ref 0.0–40.0)

## 2022-11-14 NOTE — Telephone Encounter (Signed)
Left VM for pt to return my call for results.

## 2022-11-14 NOTE — Telephone Encounter (Signed)
Results given to pt. She is taking Terbinafine for 3 months and will be done in August. She will notify her podiatrist of elevated levels

## 2022-11-14 NOTE — Telephone Encounter (Signed)
Please inform patient the following information: -Kidney function, thyroid function and blood cell counts are normal. - Cholesterol panel is excellent -Her AST, which is one of her liver enzymes, is mildly elevated at 66.  Normal is less than 37.  This can be elevated with frequent Tylenol use, alcohol use and certain medications.   -I noticed she had a prescription from her podiatrist for terbinafine.  This medication can cause an increase in liver enzymes, I would encourage her to inform her podiatrist of this elevation if she is taking this medicine.   Only if she reports she is not taking the terbinafine, please ask her when the last time she took this medication and set her up for repeat lab check for LFTs in 2 weeks.

## 2022-12-04 ENCOUNTER — Encounter (HOSPITAL_COMMUNITY): Payer: Self-pay | Admitting: Psychiatry

## 2022-12-04 ENCOUNTER — Telehealth (HOSPITAL_BASED_OUTPATIENT_CLINIC_OR_DEPARTMENT_OTHER): Payer: 59 | Admitting: Psychiatry

## 2022-12-04 ENCOUNTER — Other Ambulatory Visit (HOSPITAL_BASED_OUTPATIENT_CLINIC_OR_DEPARTMENT_OTHER): Payer: Self-pay

## 2022-12-04 ENCOUNTER — Other Ambulatory Visit (HOSPITAL_COMMUNITY): Payer: Self-pay

## 2022-12-04 VITALS — Wt 213.0 lb

## 2022-12-04 DIAGNOSIS — F988 Other specified behavioral and emotional disorders with onset usually occurring in childhood and adolescence: Secondary | ICD-10-CM

## 2022-12-04 DIAGNOSIS — F411 Generalized anxiety disorder: Secondary | ICD-10-CM

## 2022-12-04 DIAGNOSIS — F952 Tourette's disorder: Secondary | ICD-10-CM

## 2022-12-04 MED ORDER — AMPHETAMINE-DEXTROAMPHET ER 20 MG PO CP24
20.0000 mg | ORAL_CAPSULE | Freq: Every day | ORAL | 0 refills | Status: DC
Start: 2022-12-04 — End: 2022-12-26
  Filled 2022-12-04 (×2): qty 30, 30d supply, fill #0

## 2022-12-04 NOTE — Progress Notes (Signed)
[facility] MD Virtual Progress Note   Patient Location: Home Provider Location: Home Office  I connect with patient by video and verified that I am speaking with correct person by using two identifiers. I discussed the limitations of evaluation and management by telemedicine and the availability of in person appointments. I also discussed with the patient that there may be a patient responsible charge related to this service. The patient expressed understanding and agreed to proceed.  Cindy Jones [id number] 32 y.o.  12/04/2022 11:14 AM  History of Present Illness:  Patient is evaluated by video session.  She is a 32 year old Caucasian married female who was seen first time 2 months ago.  She is a patient of Dr. Michae Kava who had left the practice.  She is taking Adderall and Wellbutrin.  She notes since tolerate the Wellbutrin few months ago she has a hard time sleeping all night.  She started working Monday to Wednesday in person and she like to see if she can stop the Wellbutrin.  She denies any panic attack, crying spells, feeling of hopelessness or worthlessness.  She has not engaged in any restricting, purging.  She has a history of Tourette but lately not involved in any symptoms consistent with Tourette.  She is a good support from her husband who is a Engineer, civil (consulting) in the ICU.  She also had good support from her twin sister and mother-in-law.  Her baby is now 42-month-old.  She is taking Adderall that is helping her attention, focus.  She denies any drinking or using any illegal substances.  She is pleased that slowly and gradually weight is coming off.  Patient is close to her father's side of the family.  Patient lives with her husband.  Past Psychiatric History: Started seeing Dr. Michae Kava in 2015.  Had tried Ritalin that made angry and Vyvanse did not work.  Started taking medication since age 34 until age 27 when she got pregnant.  Off the medication because of pregnancy and  restarted in 2019.  Has seen psychiatrist in [place], [place] when she was in the school.  No history of psychosis, mania, suicidal thoughts.  At diagnosis of bulimia nervosa, Tourette with tics, anxiety and depression.  No history of illegal drug use, PTSD.  On Adderall XR 20 mg for many years but given 5 mg IR in the school.  Wellbutrin started after the pregnancy this year after having postpartum anxiety.    Outpatient Encounter Medications as of 12/04/2022  Medication Sig   Acetylcysteine (NAC) 600 MG CAPS Take 1 capsule (600 mg total) by mouth daily in the afternoon.   amphetamine-dextroamphetamine (ADDERALL XR) 20 MG 24 hr capsule Take 1 capsule (20 mg total) by mouth daily.   amphetamine-dextroamphetamine (ADDERALL XR) 20 MG 24 hr capsule Take 1 capsule (20 mg total) by mouth daily.   amphetamine-dextroamphetamine (ADDERALL XR) 20 MG 24 hr capsule Take 1 capsule (20 mg total) by mouth daily.   buPROPion (WELLBUTRIN XL) 150 MG 24 hr tablet Take 1 tablet (150 mg total) by mouth every morning.   cholecalciferol (VITAMIN D3) 25 MCG (1000 UNIT) tablet Take 1,000 Units by mouth daily.   ketoconazole (NIZORAL) 2 % cream Apply 1 Application topically daily.   terbinafine (LAMISIL) 250 MG tablet Take 1 tablet (250 mg total) by mouth daily.   No facility-administered encounter medications on file as of 12/04/2022.    Recent Results (from the past 2160 hour(s))  CBC with Differential/Platelet     Status:  None   Collection Time: 11/13/22  9:28 AM  Result Value Ref [place]   WBC 5.4 4.0 - 10.5 K/uL   RBC 4.20 3.87 - 5.11 Mil/uL   Hemoglobin 12.7 12.0 - 15.0 g/dL   HCT 16.1 09.6 - 04.5 %   MCV 90.6 78.0 - 100.0 fl   MCHC 33.3 30.0 - 36.0 g/dL   RDW 40.9 81.1 - 91.4 %   Platelets 209.0 150.0 - 400.0 K/uL   Neutrophils Relative % 63.2 43.0 - 77.0 %   Lymphocytes Relative 24.8 12.0 - 46.0 %   Monocytes Relative 9.5 3.0 - 12.0 %   Eosinophils Relative 1.7 0.0 - 5.0 %   Basophils Relative 0.8  0.0 - 3.0 %   Neutro Abs 3.4 1.4 - 7.7 K/uL   Lymphs Abs 1.3 0.7 - 4.0 K/uL   Monocytes Absolute 0.5 0.1 - 1.0 K/uL   Eosinophils Absolute 0.1 0.0 - 0.7 K/uL   Basophils Absolute 0.0 0.0 - 0.1 K/uL  Comprehensive metabolic panel     Status: Abnormal   Collection Time: 11/13/22  9:28 AM  Result Value Ref [place]   Sodium 137 135 - 145 mEq/L   Potassium 4.2 3.5 - 5.1 mEq/L   Chloride 105 96 - 112 mEq/L   CO2 21 19 - 32 mEq/L   Glucose, Bld 84 70 - 99 mg/dL   BUN 13 6 - 23 mg/dL   Creatinine, Ser 7.82 0.40 - 1.20 mg/dL   Total Bilirubin 0.6 0.2 - 1.2 mg/dL   Alkaline Phosphatase 46 39 - 117 U/L   AST 66 (H) 0 - 37 U/L   ALT 23 0 - 35 U/L   Total Protein 6.9 6.0 - 8.3 g/dL   Albumin 4.4 3.5 - 5.2 g/dL   GFR 956.21 >30.86 mL/min    Comment: Calculated using the CKD-EPI Creatinine Equation (2021)   Calcium 9.1 8.4 - 10.5 mg/dL  TSH     Status: None   Collection Time: 11/13/22  9:28 AM  Result Value Ref [place]   TSH 1.39 0.35 - 5.50 uIU/mL  Lipid panel     Status: None   Collection Time: 11/13/22  9:28 AM  Result Value Ref Range   Cholesterol 140 0 - 200 mg/dL    Comment: ATP III Classification       Desirable:  < 200 mg/dL               Borderline High:  200 - 239 mg/dL          High:  > = 578 mg/dL   Triglycerides 46.9 0.0 - 149.0 mg/dL    Comment: Normal:  <629 mg/dLBorderline High:  150 - 199 mg/dL   HDL 52.84 >13.24 mg/dL   VLDL 40.1 0.0 - 02.7 mg/dL   LDL Cholesterol 74 0 - 99 mg/dL   Total CHOL/HDL Ratio 3     Comment:                Men          Women1/2 Average Risk     3.4          3.3Average Risk          5.0          4.42X Average Risk          9.6          7.13X Average Risk          15.0  11.0                       NonHDL 88.96     Comment: NOTE:  Non-HDL goal should be 30 mg/dL higher than patient's LDL goal (i.e. LDL goal of < 70 mg/dL, would have non-HDL goal of < 100 mg/dL)     Psychiatric Specialty Exam: Physical Exam  Review of Systems  Weight 213 lb  (96.6 kg), last menstrual period 10/20/2022, not currently breastfeeding.There is no height or weight on file to calculate BMI.  General Appearance: Well Groomed  Eye Contact:  Good  Speech:  Clear and Coherent  Volume:  Normal  Mood:  Euthymic  Affect:  Appropriate  Thought Process:  Goal Directed  Orientation:  Full (Time, Place, and Person)  Thought Content:  Logical  Suicidal Thoughts:  No  Homicidal Thoughts:  No  Memory:  Immediate;   Good Recent;   Good Remote;   Good  Judgement:  Good  Insight:  Good  Psychomotor Activity:  Normal  Concentration:  Concentration: Good and Attention Span: Good  Recall:  Good  Fund of Knowledge:  Good  Language:  Good  Akathisia:  No  Handed:  Right  AIMS (if indicated):     Assets:  Communication Skills Desire for Improvement Housing Social Support Transportation  ADL's:  Intact  Cognition:  WNL  Sleep:  fair     Assessment/Plan: ADD (attention deficit disorder) without hyperactivity - Plan: amphetamine-dextroamphetamine (ADDERALL XR) 20 MG 24 hr capsule  Tourette disease  GAD (generalized anxiety disorder)  Patient like to come off from Wellbutrin because she feels sleep is not as good and her plan is to start Wellbutrin anyway because it was prescribed postpartum.  She does not feel tearful or any feeling of hopelessness.  She like to continue Adderall XR 20 mg daily is helping her attention, focus.  Patient promised to give [place] a call back if she feels needed to go back on Wellbutrin or any other medication to help anxiety if symptoms come back.  I agree with the plan.  She has a good support and she is now working and that has been helpful.  Follow-up in 3 months.   Follow Up Instructions:     I discussed the assessment and treatment plan with the patient. The patient was provided an opportunity to ask questions and all were answered. The patient agreed with the plan and demonstrated an understanding of the instructions.    The patient was advised to call back or seek an in-person evaluation if the symptoms worsen or if the condition fails to improve as anticipated.    Collaboration of Care: Other provider involved in patient's care AEB notes are available in epic to review.  Patient/Guardian was advised Release of Information must be obtained prior to any record release in order to collaborate their care with an outside provider. Patient/Guardian was advised if they have not already done so to contact the registration department to sign all necessary forms in order for [place] to release information regarding their care.   Consent: Patient/Guardian gives verbal consent for treatment and assignment of benefits for services provided during this visit. Patient/Guardian expressed understanding and agreed to proceed.     I provided 18 minutes of non face to face time during this encounter.  Note: This document was prepared by [employer] and any errors that results from this process are unintentional.    Cleotis Nipper,  MD 12/04/2022

## 2022-12-08 ENCOUNTER — Encounter: Payer: Self-pay | Admitting: Podiatry

## 2022-12-08 ENCOUNTER — Ambulatory Visit (INDEPENDENT_AMBULATORY_CARE_PROVIDER_SITE_OTHER): Payer: 59 | Admitting: Podiatry

## 2022-12-08 DIAGNOSIS — B351 Tinea unguium: Secondary | ICD-10-CM

## 2022-12-08 DIAGNOSIS — B353 Tinea pedis: Secondary | ICD-10-CM

## 2022-12-08 NOTE — Progress Notes (Signed)
  Subjective:  Patient ID: Cindy Jones, female    DOB: 12/05/1990,   MRN: [id number]  No chief complaint on file.   32 y.o. female presents for follow-up of bilateral nail fungus and dry itching feet. Realtes she has bee taking the lamisil and has a few more days left. Does relates recent blood work had LFTs elevated. .  . Denies any other pedal complaints. Denies n/v/f/c.   Past Medical History:  Diagnosis Date   Anxiety    Asthma    as a child   Binge eating disorder    Dysrhythmia    " I feel it about once a month"   Frequent headaches    GAD (generalized anxiety disorder)    GERD (gastroesophageal reflux disease)    History of acute PID 06/09/2019   Major depression in complete remission (HCC)    Migraines    PID (acute pelvic inflammatory disease)    Seasonal allergies    Tourette disorder    Urinary tract bacterial infections     Objective:  Physical Exam: Vascular: DP/PT pulses 2/4 bilateral. CFT <3 seconds. Normal hair growth on digits. No edema.  Skin. No lacerations or abrasions bilateral feet. Nails 2-5 bilateral are thickened discolored brittle and with subunga debris distally but most of proximal nail fold much improved. . Left great toenail growing in normal.  Musculoskeletal: MMT 5/5 bilateral lower extremities in DF, PF, Inversion and Eversion. Deceased ROM in DF of ankle joint.  Neurological: Sensation intact to light touch.   Assessment:   1. Onychomycosis   2. Tinea pedis of both feet        Plan:  Patient was evaluated and treated and all questions answered. -Examined patient -Discussed treatment options for painful dystrophic nails  -Culture positive for fungus as well as trauma to the nails.  -Discussed fungal nail treatment options including oral, topical, and laser treatments.  -Finish out lamisil and will discontinue.  -AST 66 and ALT 23 -Continue urea Discussed moisturizing  -Patient to return as needed for possible ingrown nail procedure  in the future.    Louann Sjogren, DPM

## 2022-12-25 ENCOUNTER — Other Ambulatory Visit (HOSPITAL_COMMUNITY): Payer: Self-pay | Admitting: Psychiatry

## 2022-12-25 DIAGNOSIS — F411 Generalized anxiety disorder: Secondary | ICD-10-CM

## 2022-12-25 DIAGNOSIS — F988 Other specified behavioral and emotional disorders with onset usually occurring in childhood and adolescence: Secondary | ICD-10-CM

## 2022-12-26 ENCOUNTER — Other Ambulatory Visit (HOSPITAL_COMMUNITY): Payer: Self-pay

## 2022-12-26 ENCOUNTER — Other Ambulatory Visit: Payer: Self-pay

## 2022-12-26 ENCOUNTER — Other Ambulatory Visit (HOSPITAL_BASED_OUTPATIENT_CLINIC_OR_DEPARTMENT_OTHER): Payer: Self-pay

## 2022-12-26 ENCOUNTER — Telehealth (HOSPITAL_COMMUNITY): Payer: Self-pay

## 2022-12-26 DIAGNOSIS — F411 Generalized anxiety disorder: Secondary | ICD-10-CM

## 2022-12-26 DIAGNOSIS — F988 Other specified behavioral and emotional disorders with onset usually occurring in childhood and adolescence: Secondary | ICD-10-CM

## 2022-12-26 MED ORDER — AMPHETAMINE-DEXTROAMPHET ER 20 MG PO CP24
20.0000 mg | ORAL_CAPSULE | Freq: Every day | ORAL | 0 refills | Status: DC
Start: 2022-12-26 — End: 2023-01-04
  Filled 2022-12-26 – 2023-01-04 (×2): qty 30, 30d supply, fill #0
  Filled ????-??-?? (×2): fill #0

## 2022-12-26 NOTE — Telephone Encounter (Signed)
I do not see any follow-up appointment.  I will send Adderall that she can pick up at her due date.  She need to make a follow-up appointment.

## 2022-12-26 NOTE — Telephone Encounter (Signed)
Patient is calling for a refill on her Adderall - she is not due for a week she is just being proactive. Patient would also like a refill on the Wellbutrin. She said she attempted to stop it but realized that was not a good idea and restarted it. Please review and advise, thank you

## 2022-12-28 ENCOUNTER — Other Ambulatory Visit (HOSPITAL_COMMUNITY): Payer: Self-pay

## 2022-12-28 ENCOUNTER — Other Ambulatory Visit (HOSPITAL_COMMUNITY): Payer: Self-pay | Admitting: Psychiatry

## 2022-12-28 ENCOUNTER — Other Ambulatory Visit (HOSPITAL_BASED_OUTPATIENT_CLINIC_OR_DEPARTMENT_OTHER): Payer: Self-pay

## 2022-12-28 DIAGNOSIS — F411 Generalized anxiety disorder: Secondary | ICD-10-CM

## 2022-12-28 MED ORDER — BUPROPION HCL ER (XL) 150 MG PO TB24
150.0000 mg | ORAL_TABLET | ORAL | 0 refills | Status: AC
Start: 2022-12-28 — End: 2023-12-28
  Filled 2022-12-28 – 2022-12-29 (×3): qty 30, 30d supply, fill #0

## 2022-12-29 ENCOUNTER — Encounter (HOSPITAL_BASED_OUTPATIENT_CLINIC_OR_DEPARTMENT_OTHER): Payer: Self-pay

## 2022-12-29 ENCOUNTER — Other Ambulatory Visit (HOSPITAL_BASED_OUTPATIENT_CLINIC_OR_DEPARTMENT_OTHER): Payer: Self-pay

## 2023-01-02 ENCOUNTER — Other Ambulatory Visit (HOSPITAL_BASED_OUTPATIENT_CLINIC_OR_DEPARTMENT_OTHER): Payer: Self-pay

## 2023-01-04 ENCOUNTER — Other Ambulatory Visit (HOSPITAL_BASED_OUTPATIENT_CLINIC_OR_DEPARTMENT_OTHER): Payer: Self-pay

## 2023-01-04 ENCOUNTER — Telehealth (HOSPITAL_COMMUNITY): Payer: Self-pay | Admitting: *Deleted

## 2023-01-04 ENCOUNTER — Other Ambulatory Visit: Payer: Self-pay

## 2023-01-04 DIAGNOSIS — F988 Other specified behavioral and emotional disorders with onset usually occurring in childhood and adolescence: Secondary | ICD-10-CM

## 2023-01-04 DIAGNOSIS — F411 Generalized anxiety disorder: Secondary | ICD-10-CM | POA: Diagnosis not present

## 2023-01-04 MED ORDER — AMPHETAMINE-DEXTROAMPHET ER 20 MG PO CP24: 20.0000 mg | ORAL_CAPSULE | Freq: Every day | ORAL | 0 refills | Status: DC

## 2023-01-04 NOTE — Telephone Encounter (Signed)
Sent to [facility]  

## 2023-01-04 NOTE — Telephone Encounter (Signed)
Pt called in regards to the Adderall XR 20 mg that she is prescribed being on backorder throughout the [facility] system. Pt advised to call pharmacies out of system, which she did and the medication is currently available @ the [facility] on [address]. Pharmacy added to profile. Pt next appointment is scheduled for 03/06/23. Please review.

## 2023-01-18 DIAGNOSIS — F411 Generalized anxiety disorder: Secondary | ICD-10-CM | POA: Diagnosis not present

## 2023-01-29 ENCOUNTER — Other Ambulatory Visit (HOSPITAL_COMMUNITY): Payer: Self-pay | Admitting: *Deleted

## 2023-01-29 ENCOUNTER — Other Ambulatory Visit (HOSPITAL_BASED_OUTPATIENT_CLINIC_OR_DEPARTMENT_OTHER): Payer: Self-pay

## 2023-01-29 ENCOUNTER — Encounter (HOSPITAL_COMMUNITY): Payer: Self-pay

## 2023-01-29 DIAGNOSIS — F411 Generalized anxiety disorder: Secondary | ICD-10-CM

## 2023-01-29 MED ORDER — BUPROPION HCL ER (XL) 150 MG PO TB24
150.0000 mg | ORAL_TABLET | ORAL | 1 refills | Status: DC
Start: 2023-01-29 — End: 2023-03-06
  Filled 2023-01-29: qty 30, 30d supply, fill #0
  Filled 2023-02-28: qty 30, 30d supply, fill #1

## 2023-01-30 ENCOUNTER — Other Ambulatory Visit (HOSPITAL_BASED_OUTPATIENT_CLINIC_OR_DEPARTMENT_OTHER): Payer: Self-pay

## 2023-01-31 ENCOUNTER — Encounter (HOSPITAL_COMMUNITY): Payer: Self-pay

## 2023-01-31 DIAGNOSIS — F988 Other specified behavioral and emotional disorders with onset usually occurring in childhood and adolescence: Secondary | ICD-10-CM

## 2023-01-31 MED ORDER — AMPHETAMINE-DEXTROAMPHET ER 20 MG PO CP24
20.0000 mg | ORAL_CAPSULE | Freq: Every day | ORAL | 0 refills | Status: DC
Start: 2023-01-31 — End: 2023-02-02

## 2023-01-31 NOTE — Telephone Encounter (Signed)
Sent to [facility]. Can pick up on due date.

## 2023-01-31 NOTE — Telephone Encounter (Signed)
Please review

## 2023-02-01 DIAGNOSIS — F411 Generalized anxiety disorder: Secondary | ICD-10-CM | POA: Diagnosis not present

## 2023-02-02 ENCOUNTER — Other Ambulatory Visit (HOSPITAL_COMMUNITY): Payer: Self-pay | Admitting: Psychiatry

## 2023-02-02 ENCOUNTER — Other Ambulatory Visit (HOSPITAL_BASED_OUTPATIENT_CLINIC_OR_DEPARTMENT_OTHER): Payer: Self-pay

## 2023-02-02 ENCOUNTER — Telehealth (HOSPITAL_COMMUNITY): Payer: Self-pay

## 2023-02-02 DIAGNOSIS — F988 Other specified behavioral and emotional disorders with onset usually occurring in childhood and adolescence: Secondary | ICD-10-CM

## 2023-02-02 MED ORDER — AMPHETAMINE-DEXTROAMPHET ER 20 MG PO CP24
20.0000 mg | ORAL_CAPSULE | Freq: Every day | ORAL | 0 refills | Status: DC
Start: 2023-02-02 — End: 2023-02-27
  Filled 2023-02-02: qty 30, 30d supply, fill #0

## 2023-02-02 NOTE — Telephone Encounter (Signed)
Dr. Lolly Mustache sent a prescription to the pharmacy for this patient. It was for her Adderall prescription. The pharmacy that he sent it to does not have any in stock. Patient is requesting that the medication go to her other pharmacy  - [facility] - it is already in the chart. Once you resend the medication, I will call [facility] and cancel the one that Dr. Lolly Mustache sent there. Please review and advise, thank you

## 2023-02-15 DIAGNOSIS — F411 Generalized anxiety disorder: Secondary | ICD-10-CM | POA: Diagnosis not present

## 2023-02-25 DIAGNOSIS — R300 Dysuria: Secondary | ICD-10-CM | POA: Diagnosis not present

## 2023-02-25 DIAGNOSIS — N3000 Acute cystitis without hematuria: Secondary | ICD-10-CM | POA: Diagnosis not present

## 2023-02-27 ENCOUNTER — Other Ambulatory Visit (HOSPITAL_BASED_OUTPATIENT_CLINIC_OR_DEPARTMENT_OTHER): Payer: Self-pay

## 2023-02-27 ENCOUNTER — Encounter (HOSPITAL_COMMUNITY): Payer: Self-pay

## 2023-02-27 ENCOUNTER — Other Ambulatory Visit (HOSPITAL_COMMUNITY): Payer: Self-pay

## 2023-02-27 ENCOUNTER — Other Ambulatory Visit (HOSPITAL_COMMUNITY): Payer: Self-pay | Admitting: Psychiatry

## 2023-02-27 DIAGNOSIS — F988 Other specified behavioral and emotional disorders with onset usually occurring in childhood and adolescence: Secondary | ICD-10-CM

## 2023-02-27 DIAGNOSIS — F411 Generalized anxiety disorder: Secondary | ICD-10-CM

## 2023-02-27 MED ORDER — AMPHETAMINE-DEXTROAMPHET ER 20 MG PO CP24
20.0000 mg | ORAL_CAPSULE | Freq: Every day | ORAL | 0 refills | Status: DC
Start: 2023-02-27 — End: 2023-03-06
  Filled 2023-02-27 – 2023-03-02 (×2): qty 30, 30d supply, fill #0

## 2023-02-27 NOTE — Telephone Encounter (Signed)
I sent Adderall prescription and she can pick up on her due date.  I see Wellbutrin still has a refill remaining but not sure if she is taking because she wanted to come off on the Wellbutrin.

## 2023-02-28 ENCOUNTER — Other Ambulatory Visit (HOSPITAL_BASED_OUTPATIENT_CLINIC_OR_DEPARTMENT_OTHER): Payer: Self-pay

## 2023-02-28 ENCOUNTER — Other Ambulatory Visit: Payer: Self-pay

## 2023-03-01 ENCOUNTER — Other Ambulatory Visit (HOSPITAL_BASED_OUTPATIENT_CLINIC_OR_DEPARTMENT_OTHER): Payer: Self-pay

## 2023-03-01 ENCOUNTER — Other Ambulatory Visit (HOSPITAL_COMMUNITY): Payer: Self-pay

## 2023-03-02 ENCOUNTER — Other Ambulatory Visit (HOSPITAL_BASED_OUTPATIENT_CLINIC_OR_DEPARTMENT_OTHER): Payer: Self-pay

## 2023-03-06 ENCOUNTER — Telehealth (HOSPITAL_BASED_OUTPATIENT_CLINIC_OR_DEPARTMENT_OTHER): Payer: 59 | Admitting: Psychiatry

## 2023-03-06 ENCOUNTER — Other Ambulatory Visit (HOSPITAL_BASED_OUTPATIENT_CLINIC_OR_DEPARTMENT_OTHER): Payer: Self-pay

## 2023-03-06 ENCOUNTER — Encounter (HOSPITAL_COMMUNITY): Payer: Self-pay | Admitting: Psychiatry

## 2023-03-06 VITALS — Wt 195.0 lb

## 2023-03-06 DIAGNOSIS — F988 Other specified behavioral and emotional disorders with onset usually occurring in childhood and adolescence: Secondary | ICD-10-CM

## 2023-03-06 DIAGNOSIS — F411 Generalized anxiety disorder: Secondary | ICD-10-CM | POA: Diagnosis not present

## 2023-03-06 DIAGNOSIS — F952 Tourette's disorder: Secondary | ICD-10-CM | POA: Diagnosis not present

## 2023-03-06 MED ORDER — BUPROPION HCL ER (XL) 150 MG PO TB24
150.0000 mg | ORAL_TABLET | ORAL | 2 refills | Status: DC
  Filled 2023-03-06 – 2023-03-30 (×2): qty 30, 30d supply, fill #0
  Filled 2023-04-25: qty 30, 30d supply, fill #1
  Filled 2023-05-24: qty 30, 30d supply, fill #2

## 2023-03-06 MED ORDER — AMPHETAMINE-DEXTROAMPHET ER 20 MG PO CP24
20.0000 mg | ORAL_CAPSULE | Freq: Every day | ORAL | 0 refills | Status: DC
Start: 2023-03-06 — End: 2023-04-25
  Filled 2023-03-06 – 2023-03-30 (×2): qty 30, 30d supply, fill #0

## 2023-03-06 NOTE — Progress Notes (Signed)
[facility] MD Virtual Progress Note   Patient Location: Home Provider Location: Home Office  I connect with patient by video and verified that I am speaking with correct person by using two identifiers. I discussed the limitations of evaluation and management by telemedicine and the availability of in person appointments. I also discussed with the patient that there may be a patient responsible charge related to this service. The patient expressed understanding and agreed to proceed.  Cindy Jones [id number] 32 y.o.  03/06/2023 8:23 AM  History of Present Illness:  Patient was evaluated by video session.  She has been doing well on her current medication.  She admitted sometimes struggle with getting the Adderall from the pharmacy due to backorder.  She reported tried to stop the Wellbutrin but started to have anticipation of anxiety, feeling of doom, nervousness and decided to go back to take every day.  She also reported that her therapist suggested not to stop the medication at this time.  She reported her attention, focus is good.  She is able to do multitasking.  She is able to do time management very well.  She enjoys the company of her 73-month-old baby.  She is working in person Monday to Wednesday.  She has no tremors, shakes or any EPS.  She tried to lose weight and has been very successful lately.  She does walk every day with the dog.  She denies drinking or using any illegal substances.  Her plan is to go back to her prior before pregnancy which is around [name]  She had a good support from her husband who is a Engineer, civil (consulting) and works in the ICU.  She had a good support from her family.  Patient has Tourette's syndrome but stable and there are few times noticed anticipation of tics if she is stressed out and sitting too long on the computer.  Past psychiatric History: Started seeing Dr. Michae Kava in 2015.  Had tried Ritalin that made angry and Vyvanse did not work.  Started taking  medication since age 28 until age 74 when she got pregnant.  Off the medication because of pregnancy and restarted in 2019.  Has seen psychiatrist in [place], [place] when she was in the school.  No history of psychosis, mania, suicidal thoughts.  At diagnosis of bulimia nervosa, Tourette with tics, anxiety and depression.  No history of illegal drug use, PTSD.  On Adderall XR 20 mg for many years but given 5 mg IR in the school.  Wellbutrin started after the pregnancy this year after having postpartum anxiety.    Outpatient Encounter Medications as of 03/06/2023  Medication Sig   Acetylcysteine (NAC) 600 MG CAPS Take 1 capsule (600 mg total) by mouth daily in the afternoon.   amphetamine-dextroamphetamine (ADDERALL XR) 20 MG 24 hr capsule Take 1 capsule (20 mg total) by mouth daily.   buPROPion (WELLBUTRIN XL) 150 MG 24 hr tablet Take 1 tablet (150 mg total) by mouth every morning.   cholecalciferol (VITAMIN D3) 25 MCG (1000 UNIT) tablet Take 1,000 Units by mouth daily.   ketoconazole (NIZORAL) 2 % cream Apply 1 Application topically daily.   No facility-administered encounter medications on file as of 03/06/2023.    No results found for this or any previous visit (from the past 2160 hour(s)).   Psychiatric Specialty Exam: Physical Exam  Review of Systems  Weight 195 lb (88.5 kg), not currently breastfeeding.There is no height or weight on file to calculate BMI.  General Appearance: Casual  Eye Contact:  Good  Speech:  Clear and Coherent  Volume:  Normal  Mood:  Euthymic  Affect:  Appropriate  Thought Process:  Goal Directed  Orientation:  Full (Time, Place, and Person)  Thought Content:  Logical  Suicidal Thoughts:  No  Homicidal Thoughts:  No  Memory:  Immediate;   Good Recent;   Good Remote;   Good  Judgement:  Good  Insight:  Present  Psychomotor Activity:  Normal  Concentration:  Concentration: Good and Attention Span: Good  Recall:  Good  Fund of Knowledge:  Good   Language:  Good  Akathisia:  No  Handed:  Right  AIMS (if indicated):     Assets:  Communication Skills Desire for Improvement Housing Resilience Social Support Talents/Skills Transportation  ADL's:  Intact  Cognition:  WNL  Sleep:  ok with melatonin gummy     Assessment/Plan: GAD (generalized anxiety disorder) - Plan: buPROPion (WELLBUTRIN XL) 150 MG 24 hr tablet  ADD (attention deficit disorder) without hyperactivity - Plan: amphetamine-dextroamphetamine (ADDERALL XR) 20 MG 24 hr capsule  Tourette disease  Patient is stable on current medication.  She tried to come off from Wellbutrin but started to have anticipation of anxiety and nervousness and decided to continue.  Discussed medication side effects and benefits.  Continue Adderall XR 20 mg daily which is helping her focus and attention.  Continue Wellbutrin XL 150 mg daily.  We talk about trying to wean her off from Wellbutrin in the future.  She is taking Wellbutrin since March and next March may be a good time to consider weaning her off.  Patient acknowledged and agreed with the plan.  Encouraged to continue therapy.  Recommend to call us back if she has any question.  Follow-up in 3 months.   Follow Up Instructions:     I discussed the assessment and treatment plan with the patient. The patient was provided an opportunity to ask questions and all were answered. The patient agreed with the plan and demonstrated an understanding of the instructions.   The patient was advised to call back or seek an in-person evaluation if the symptoms worsen or if the condition fails to improve as anticipated.    Collaboration of Care: Other provider involved in patient's care AEB notes are available in epic to review  Patient/Guardian was advised Release of Information must be obtained prior to any record release in order to collaborate their care with an outside provider. Patient/Guardian was advised if they have not already done so  to contact the registration department to sign all necessary forms in order for [place] to release information regarding their care.   Consent: Patient/Guardian gives verbal consent for treatment and assignment of benefits for services provided during this visit. Patient/Guardian expressed understanding and agreed to proceed.     I provided 21 minutes of non face to face time during this encounter.  Note: This document was prepared by [employer] voice dictation technology and any errors that results from this process are unintentional.    Cleotis Nipper, MD 03/06/2023

## 2023-03-30 ENCOUNTER — Other Ambulatory Visit (HOSPITAL_BASED_OUTPATIENT_CLINIC_OR_DEPARTMENT_OTHER): Payer: Self-pay

## 2023-04-11 DIAGNOSIS — R051 Acute cough: Secondary | ICD-10-CM | POA: Diagnosis not present

## 2023-04-11 DIAGNOSIS — R509 Fever, unspecified: Secondary | ICD-10-CM | POA: Diagnosis not present

## 2023-04-11 DIAGNOSIS — J029 Acute pharyngitis, unspecified: Secondary | ICD-10-CM | POA: Diagnosis not present

## 2023-04-11 DIAGNOSIS — H6991 Unspecified Eustachian tube disorder, right ear: Secondary | ICD-10-CM | POA: Diagnosis not present

## 2023-04-11 DIAGNOSIS — J0100 Acute maxillary sinusitis, unspecified: Secondary | ICD-10-CM | POA: Diagnosis not present

## 2023-04-25 ENCOUNTER — Other Ambulatory Visit (HOSPITAL_COMMUNITY): Payer: Self-pay | Admitting: Psychiatry

## 2023-04-25 DIAGNOSIS — F988 Other specified behavioral and emotional disorders with onset usually occurring in childhood and adolescence: Secondary | ICD-10-CM

## 2023-04-26 ENCOUNTER — Other Ambulatory Visit: Payer: Self-pay

## 2023-04-26 ENCOUNTER — Other Ambulatory Visit (HOSPITAL_BASED_OUTPATIENT_CLINIC_OR_DEPARTMENT_OTHER): Payer: Self-pay

## 2023-04-26 MED ORDER — AMPHETAMINE-DEXTROAMPHET ER 20 MG PO CP24
20.0000 mg | ORAL_CAPSULE | Freq: Every day | ORAL | 0 refills | Status: DC
  Filled 2023-04-26 – 2023-04-27 (×3): qty 30, 30d supply, fill #0

## 2023-04-27 ENCOUNTER — Other Ambulatory Visit (HOSPITAL_BASED_OUTPATIENT_CLINIC_OR_DEPARTMENT_OTHER): Payer: Self-pay

## 2023-04-27 ENCOUNTER — Telehealth (HOSPITAL_COMMUNITY): Payer: Self-pay | Admitting: *Deleted

## 2023-04-27 ENCOUNTER — Other Ambulatory Visit: Payer: Self-pay

## 2023-04-27 NOTE — Telephone Encounter (Signed)
error 

## 2023-05-24 ENCOUNTER — Encounter (HOSPITAL_COMMUNITY): Payer: Self-pay

## 2023-05-24 ENCOUNTER — Other Ambulatory Visit (HOSPITAL_BASED_OUTPATIENT_CLINIC_OR_DEPARTMENT_OTHER): Payer: Self-pay

## 2023-05-24 DIAGNOSIS — F988 Other specified behavioral and emotional disorders with onset usually occurring in childhood and adolescence: Secondary | ICD-10-CM

## 2023-05-25 ENCOUNTER — Other Ambulatory Visit (HOSPITAL_BASED_OUTPATIENT_CLINIC_OR_DEPARTMENT_OTHER): Payer: Self-pay

## 2023-05-25 MED ORDER — AMPHETAMINE-DEXTROAMPHET ER 20 MG PO CP24
20.0000 mg | ORAL_CAPSULE | Freq: Every day | ORAL | 0 refills | Status: DC
  Filled ????-??-??: fill #0

## 2023-05-28 ENCOUNTER — Other Ambulatory Visit (HOSPITAL_BASED_OUTPATIENT_CLINIC_OR_DEPARTMENT_OTHER): Payer: Self-pay

## 2023-05-28 MED ORDER — AMPHETAMINE-DEXTROAMPHET ER 20 MG PO CP24
20.0000 mg | ORAL_CAPSULE | Freq: Every day | ORAL | 0 refills | Status: DC
  Filled ????-??-??: fill #0

## 2023-05-28 NOTE — Addendum Note (Signed)
Addended by: Kathryne Sharper T on: 05/28/2023 09:04 AM   Modules accepted: Orders

## 2023-05-28 NOTE — Telephone Encounter (Signed)
I sent a new prescription.  Pharmacy have to check the data base if she is getting early refills, if no issues then she should dispense the medication.

## 2023-05-29 ENCOUNTER — Other Ambulatory Visit (HOSPITAL_BASED_OUTPATIENT_CLINIC_OR_DEPARTMENT_OTHER): Payer: Self-pay

## 2023-05-29 MED ORDER — AMPHETAMINE-DEXTROAMPHET ER 20 MG PO CP24
20.0000 mg | ORAL_CAPSULE | Freq: Every day | ORAL | 0 refills | Status: DC
  Filled 2023-05-29: qty 30, 30d supply, fill #0

## 2023-05-29 NOTE — Telephone Encounter (Signed)
Re-send

## 2023-05-29 NOTE — Addendum Note (Signed)
Addended by: Kathryne Sharper T on: 05/29/2023 10:43 AM   Modules accepted: Orders

## 2023-05-30 ENCOUNTER — Other Ambulatory Visit (HOSPITAL_BASED_OUTPATIENT_CLINIC_OR_DEPARTMENT_OTHER): Payer: Self-pay

## 2023-06-06 ENCOUNTER — Encounter (HOSPITAL_COMMUNITY): Payer: Self-pay | Admitting: Psychiatry

## 2023-06-06 ENCOUNTER — Telehealth (HOSPITAL_COMMUNITY): Payer: Self-pay | Admitting: Psychiatry

## 2023-06-06 ENCOUNTER — Other Ambulatory Visit (HOSPITAL_BASED_OUTPATIENT_CLINIC_OR_DEPARTMENT_OTHER): Payer: Self-pay

## 2023-06-06 VITALS — Wt 190.0 lb

## 2023-06-06 DIAGNOSIS — F988 Other specified behavioral and emotional disorders with onset usually occurring in childhood and adolescence: Secondary | ICD-10-CM

## 2023-06-06 DIAGNOSIS — F411 Generalized anxiety disorder: Secondary | ICD-10-CM

## 2023-06-06 DIAGNOSIS — F952 Tourette's disorder: Secondary | ICD-10-CM

## 2023-06-06 MED ORDER — BUPROPION HCL ER (XL) 150 MG PO TB24
150.0000 mg | ORAL_TABLET | ORAL | 2 refills | Status: DC
  Filled 2023-06-06 – 2023-06-28 (×2): qty 30, 30d supply, fill #0
  Filled 2023-07-27: qty 30, 30d supply, fill #1
  Filled 2023-08-27: qty 30, 30d supply, fill #2

## 2023-06-06 NOTE — Progress Notes (Signed)
 [facility] MD Virtual Progress Note   Patient Location: Work Provider Location: Home Office  I connect with patient by video and verified that I am speaking with correct person by using two identifiers. I discussed the limitations of evaluation and management by telemedicine and the availability of in person appointments. I also discussed with the patient that there may be a patient responsible charge related to this service. The patient expressed understanding and agreed to proceed.  Cindy Jones [id number] 32 y.o.  06/06/2023 8:48 AM  History of Present Illness:  Patient is evaluated by video session.  She reported her job is busy and she is anxious because husband is doing clinical rotation which is required for nurse practitioner but also working as needed as a engineer, civil (consulting) in ICU.  Patient told husband cut down the job so he can focus on clinical rotation.  Patient working Monday to Wednesday in person.  Patient works for the company which makes scientist, product/process development.  Patient told baby is now 49-year-old and getting into many things and that stresses her out.  However she feels the current medicine is working.  Sometimes she struggles getting Adderall  from the pharmacy because they are backorder.  She reported sleep is very good.  She denies any crying spells or any feeling of hopelessness or worthlessness.  She denies any panic attack recently.  Her chloride is also stable and denies any recent tics.  Her attention concentration is better and she is able to do multitasking at work.  She is able to do timed management.  She enjoys the company of her 73-year-old daily and sometime go for a walk.  She denies drinking or using any illegal substances.  Her appetite is okay.  Her weight is stable.  She has no tremors shakes or any EPS.  She like to keep the Wellbutrin  for now but will consider in the future to wean herself off.  Past Psychiatric History: Started seeing Dr.  Brutus in 2015.  Had tried Ritalin that made angry and Vyvanse  did not work.  Started taking medication since age 18 until age 7 when she got pregnant.  Off the medication because of pregnancy and restarted in 2019.  Has seen psychiatrist in [place] , [place]  when she was in the school.  No history of psychosis, mania, suicidal thoughts.  At diagnosis of bulimia nervosa, Tourette with tics, anxiety and depression.  No history of illegal drug use, PTSD.  On Adderall  XR 20 mg for many years but given 5 mg IR in the school.  Wellbutrin  started after the pregnancy this year after having postpartum anxiety.    Outpatient Encounter Medications as of 06/06/2023  Medication Sig   amphetamine -dextroamphetamine  (ADDERALL  XR) 20 MG 24 hr capsule Take 1 capsule (20 mg total) by mouth daily.   buPROPion  (WELLBUTRIN  XL) 150 MG 24 hr tablet Take 1 tablet (150 mg total) by mouth every morning.   cholecalciferol (VITAMIN D3) 25 MCG (1000 UNIT) tablet Take 1,000 Units by mouth daily.   ketoconazole  (NIZORAL ) 2 % cream Apply 1 Application topically daily.   No facility-administered encounter medications on file as of 06/06/2023.    No results found for this or any previous visit (from the past 2160 hours).   Psychiatric Specialty Exam: Physical Exam  Review of Systems  Weight 190 lb (86.2 kg), not currently breastfeeding.There is no height or weight on file to calculate BMI.  General Appearance: Casual  Eye Contact:  Good  Speech:  Clear and Coherent  Volume:  Normal  Mood:  Anxious  Affect:  Appropriate  Thought Process:  Goal Directed  Orientation:  Full (Time, Place, and Person)  Thought Content:  Logical  Suicidal Thoughts:  No  Homicidal Thoughts:  No  Memory:  Immediate;   Good Recent;   Good Remote;   Good  Judgement:  Good  Insight:  Present  Psychomotor Activity:  Normal  Concentration:  Concentration: Good and Attention Span: Good  Recall:  Good  Fund of Knowledge:  Good  Language:   Good  Akathisia:  Yes  Handed:  Right  AIMS (if indicated):     Assets:  Communication Skills Desire for Improvement Housing Resilience Social Support Talents/Skills Transportation  ADL's:  Intact  Cognition:  WNL  Sleep:  ok     Assessment/Plan: GAD (generalized anxiety disorder) - Plan: buPROPion  (WELLBUTRIN  XL) 150 MG 24 hr tablet  ADD (attention deficit disorder) without hyperactivity - Plan: buPROPion  (WELLBUTRIN  XL) 150 MG 24 hr tablet  Tourette disease - Plan: buPROPion  (WELLBUTRIN  XL) 150 MG 24 hr tablet  Patient is a stable on current medication despite increase stress level at work and husband is busy and doing clinical rotation for his nurse practitioner requirement.  Has been is also working as needed in ICU as a engineer, civil (consulting).  She is taking Adderall  which is helping her focus attention and multitasking.  Continue Adderall  XR 20 mg daily and Wellbutrin  XL 150 mg in the morning.  Patient will call us  when she needs a new prescription of Adderall .  Recommended to call us  back if she is any question or any concern.  Follow-up in 3 months   Follow Up Instructions:     I discussed the assessment and treatment plan with the patient. The patient was provided an opportunity to ask questions and all were answered. The patient agreed with the plan and demonstrated an understanding of the instructions.   The patient was advised to call back or seek an in-person evaluation if the symptoms worsen or if the condition fails to improve as anticipated.    Collaboration of Care: Other provider involved in patient's care AEB notes are available in epic to review  Patient/Guardian was advised Release of Information must be obtained prior to any record release in order to collaborate their care with an outside provider. Patient/Guardian was advised if they have not already done so to contact the registration department to sign all necessary forms in order for us  to release information regarding  their care.   Consent: Patient/Guardian gives verbal consent for treatment and assignment of benefits for services provided during this visit. Patient/Guardian expressed understanding and agreed to proceed.     I provided 20 minutes of non face to face time during this encounter.  Note: This document was prepared by [employer] voice dictation technology and any errors that results from this process are unintentional.    Leni ONEIDA Client, MD 06/06/2023

## 2023-06-28 ENCOUNTER — Encounter (HOSPITAL_COMMUNITY): Payer: Self-pay

## 2023-06-28 ENCOUNTER — Other Ambulatory Visit (HOSPITAL_BASED_OUTPATIENT_CLINIC_OR_DEPARTMENT_OTHER): Payer: Self-pay

## 2023-06-28 ENCOUNTER — Other Ambulatory Visit (HOSPITAL_COMMUNITY): Payer: Self-pay | Admitting: Psychiatry

## 2023-06-28 DIAGNOSIS — F988 Other specified behavioral and emotional disorders with onset usually occurring in childhood and adolescence: Secondary | ICD-10-CM

## 2023-07-02 ENCOUNTER — Other Ambulatory Visit (HOSPITAL_BASED_OUTPATIENT_CLINIC_OR_DEPARTMENT_OTHER): Payer: Self-pay

## 2023-07-02 ENCOUNTER — Telehealth (HOSPITAL_COMMUNITY): Payer: Self-pay | Admitting: *Deleted

## 2023-07-02 ENCOUNTER — Other Ambulatory Visit (HOSPITAL_COMMUNITY): Payer: Self-pay

## 2023-07-02 DIAGNOSIS — F988 Other specified behavioral and emotional disorders with onset usually occurring in childhood and adolescence: Secondary | ICD-10-CM

## 2023-07-02 MED ORDER — AMPHETAMINE-DEXTROAMPHET ER 20 MG PO CP24
20.0000 mg | ORAL_CAPSULE | Freq: Every day | ORAL | 0 refills | Status: DC
  Filled 2023-07-02: qty 30, 30d supply, fill #0

## 2023-07-02 NOTE — Telephone Encounter (Signed)
 Pt called requesting a refill of the Adderall XR 20 mg caps. Last script e-scribed on 05/29/23. F/u appointment scheduled for 09/03/23.

## 2023-07-02 NOTE — Telephone Encounter (Signed)
 Send to [facility].

## 2023-07-09 ENCOUNTER — Encounter: Payer: Self-pay | Admitting: Family Medicine

## 2023-07-10 ENCOUNTER — Other Ambulatory Visit (HOSPITAL_BASED_OUTPATIENT_CLINIC_OR_DEPARTMENT_OTHER): Payer: Self-pay

## 2023-07-10 ENCOUNTER — Encounter: Payer: Self-pay | Admitting: Family Medicine

## 2023-07-10 ENCOUNTER — Ambulatory Visit (INDEPENDENT_AMBULATORY_CARE_PROVIDER_SITE_OTHER): Admitting: Family Medicine

## 2023-07-10 VITALS — BP 109/74 | HR 83 | Ht 65.35 in | Wt 192.2 lb

## 2023-07-10 DIAGNOSIS — M542 Cervicalgia: Secondary | ICD-10-CM | POA: Diagnosis not present

## 2023-07-10 DIAGNOSIS — R42 Dizziness and giddiness: Secondary | ICD-10-CM | POA: Diagnosis not present

## 2023-07-10 DIAGNOSIS — M62838 Other muscle spasm: Secondary | ICD-10-CM

## 2023-07-10 MED ORDER — PREDNISONE 20 MG PO TABS
ORAL_TABLET | ORAL | 0 refills | Status: AC
  Filled 2023-07-10: qty 15, 9d supply, fill #0

## 2023-07-10 MED ORDER — TIZANIDINE HCL 4 MG PO TABS
2.0000 mg | ORAL_TABLET | Freq: Three times a day (TID) | ORAL | 0 refills | Status: DC | PRN
  Filled 2023-07-10: qty 60, 20d supply, fill #0

## 2023-07-10 MED ORDER — METHYLPREDNISOLONE ACETATE 80 MG/ML IJ SUSP
80.0000 mg | Freq: Once | INTRAMUSCULAR | Status: AC
Start: 2023-07-10 — End: 2023-07-10
  Administered 2023-07-10: 80 mg via INTRAMUSCULAR

## 2023-07-10 NOTE — Telephone Encounter (Signed)
 Pt self scheduled for 3/11.

## 2023-07-10 NOTE — Progress Notes (Signed)
 Cindy Jones , [birth year], 33 y.o., female MRN: [id number] Patient Care Team    Relationship Specialty Notifications Start End  Natalia Leatherwood, DO PCP - General Family Medicine  11/10/22   Karie Soda, MD Consulting Physician General Surgery  01/31/16   Armbruster, Willaim Rayas, MD Consulting Physician Gastroenterology  01/31/16   Reva Bores, MD Consulting Physician Obstetrics and Gynecology  10/27/19   Drema Dallas, DO Consulting Physician Neurology  08/09/20   Kathrynn Running, MD Consulting Physician Obstetrics and Gynecology  05/04/22   Venora Maples, MD Consulting Physician Family Medicine  11/13/22   Thomasene Ripple, DO Consulting Physician Cardiology  11/13/22     Chief Complaint  Patient presents with   Neck Pain    Describe as shooting pain, & vertigo since Friday.; has tried taking Advil     Subjective: YOUSRA Jones is a 33 y.o. Pt presents for an OV with complaints of left-sided neck and trapezius pain.  She reports she noticed mild vertigo symptoms starting on Friday along with neck stiffness, and then the neck and upper shoulder pain started yesterday.  She tried Advil to help with the discomfort.  She was seen at the urgent care on Friday and provided with meclizine, which she states makes her sleepy.  She reports the vertigo is worse when she looks up or lays down.  She feels queasy when vertigo occurs.  Last menstrual period was last week.  She reports a sharp shooting pain occurs multiple times throughout the day. She denies any recent illness, fevers or chills. Patient reports she did have a massage completed on Thursday.  Otherwise, no increased activity or injury she can recall.      07/10/2023    1:09 PM 11/13/2022    9:21 AM 08/31/2022    9:38 AM 08/10/2022   10:08 AM 05/25/2022   11:42 AM  Depression screen PHQ 2/9  Decreased Interest 0 0     Down, Depressed, Hopeless 0 0     PHQ - 2 Score 0 0     Altered sleeping  0     Tired, decreased energy  1      Change in appetite  0     Feeling bad or failure about yourself   0     Trouble concentrating  0     Moving slowly or fidgety/restless  0     Suicidal thoughts  0     PHQ-9 Score  1     Difficult doing work/chores          Information is confidential and restricted. Go to Review Flowsheets to unlock data.    Allergies  Allergen Reactions   Latex Swelling and Rash   Barley Grass Hives   Cranberry Juice Powder Other (See Comments)    Throat pain.   Naproxen Other (See Comments)    "Liver starts hurting" Diarrhea   Nickel     Skin peeling   Sulfa Antibiotics Hives and Swelling   Social History   Social History Narrative   - Married, no children.   - Automotive engineer education.   - She works FT as a Patent attorney.   - Her aunt & uncle were her guardians when she was in HS. They live in [place], [place].    - She has a fraternal twin sister.    - Wears her seatbelt, smoke detectors in the home.   Right handed   Past  Medical History:  Diagnosis Date   Anxiety    Asthma    as a child   Binge eating disorder    Dysrhythmia    " I feel it about once a month"   Frequent headaches    GAD (generalized anxiety disorder)    GERD (gastroesophageal reflux disease)    History of acute PID 06/09/2019   Major depression in complete remission (HCC)    Migraines    PID (acute pelvic inflammatory disease)    Seasonal allergies    Tourette disorder    Urinary tract bacterial infections    Past Surgical History:  Procedure Laterality Date   CHOLECYSTECTOMY     ESOPHAGOGASTRODUODENOSCOPY     LAPAROSCOPIC CHOLECYSTECTOMY SINGLE SITE WITH INTRAOPERATIVE CHOLANGIOGRAM N/A 03/15/2016   Procedure: LAPAROSCOPIC CHOLECYSTECTOMY SINGLE SITE WITH INTRAOPERATIVE CHOLANGIOGRAM;  Surgeon: Karie Soda, MD;  Location: [facility] OR;  Service: General;  Laterality: N/A;   TONSILLECTOMY AND ADENOIDECTOMY  2013   WISDOM TOOTH EXTRACTION     Family History  Problem Relation Age of Onset   Alcohol abuse Mother     Bipolar disorder Mother    Cancer Father        sarcoma; passed away when pt was 8   Asthma Sister    Allergic rhinitis Sister    Eczema Sister    Emphysema Maternal Grandmother    Tourette syndrome Maternal Grandfather    Emphysema Paternal Grandmother    Heart disease Paternal Grandfather    Tourette syndrome Maternal Aunt    Alcohol abuse Maternal Aunt    Alcohol abuse Maternal Uncle    Hyperlipidemia Paternal Aunt    Hashimoto's thyroiditis Paternal Aunt    Suicidality Neg Hx    Urticaria Neg Hx    Angioedema Neg Hx    Allergies as of 07/10/2023       Reactions   Latex Swelling, Rash   Barley Grass Hives   Cranberry Juice Powder Other (See Comments)   Throat pain.   Naproxen Other (See Comments)   "Liver starts hurting" Diarrhea   Nickel    Skin peeling   Sulfa Antibiotics Hives, Swelling        Medication List        Accurate as of July 10, 2023  1:32 PM. If you have any questions, ask your nurse or doctor.          STOP taking these medications    ketoconazole 2 % cream Commonly known as: NIZORAL Stopped by: Felix Pacini       TAKE these medications    amphetamine-dextroamphetamine 20 MG 24 hr capsule Commonly known as: Adderall XR Take 1 capsule (20 mg total) by mouth daily.   buPROPion 150 MG 24 hr tablet Commonly known as: Wellbutrin XL Take 1 tablet (150 mg total) by mouth every morning.   cholecalciferol 25 MCG (1000 UNIT) tablet Commonly known as: VITAMIN D3 Take 1,000 Units by mouth daily.   predniSONE 20 MG tablet Commonly known as: DELTASONE 60 mg x2d, 40 mg x3d, 20 mg x2d, 10 mg x2d Start taking on: July 11, 2023 Started by: Felix Pacini   tiZANidine 4 MG tablet Commonly known as: Zanaflex Take 0.5-1 tablets (2-4 mg total) by mouth every 8 (eight) hours as needed for muscle spasms. Started by: Felix Pacini        All past medical history, surgical history, allergies, family history, immunizations andmedications  were updated in the EMR today and reviewed under the history and medication portions  of their EMR.     ROS Negative, with the exception of above mentioned in HPI   Objective:  BP 109/74   Pulse 83   Ht 5' 5.35" (1.66 m)   Wt 192 lb 3.2 oz (87.2 kg)   SpO2 100%   BMI 31.64 kg/m  Body mass index is 31.64 kg/m. Physical Exam Vitals and nursing note reviewed.  Constitutional:      General: She is not in acute distress.    Appearance: Normal appearance. She is normal weight. She is not ill-appearing or toxic-appearing.  HENT:     Head: Normocephalic and atraumatic.     Right Ear: Tympanic membrane, ear canal and external ear normal.     Left Ear: Tympanic membrane, ear canal and external ear normal.     Nose: No congestion or rhinorrhea.     Mouth/Throat:     Mouth: Mucous membranes are moist.     Pharynx: No oropharyngeal exudate or posterior oropharyngeal erythema.  Eyes:     General: No scleral icterus.       Right eye: No discharge.        Left eye: No discharge.     Extraocular Movements: Extraocular movements intact.     Conjunctiva/sclera: Conjunctivae normal.     Pupils: Pupils are equal, round, and reactive to light.  Cardiovascular:     Rate and Rhythm: Normal rate and regular rhythm.  Pulmonary:     Effort: Pulmonary effort is normal.     Breath sounds: Normal breath sounds.  Musculoskeletal:        General: Normal range of motion.     Cervical back: Neck supple. Spasms and tenderness present. No rigidity, torticollis or bony tenderness. Pain with movement present. Normal range of motion.     Comments: Trigger supraspinatus trigger point present.  Skin:    Findings: No rash.  Neurological:     Mental Status: She is alert and oriented to person, place, and time. Mental status is at baseline.     Motor: No weakness.     Coordination: Coordination normal.     Gait: Gait normal.  Psychiatric:        Mood and Affect: Mood normal.        Behavior: Behavior  normal.        Thought Content: Thought content normal.        Judgment: Judgment normal.      No results found. No results found. No results found for this or any previous visit (from the past 24 hours).  Assessment/Plan: XITLALLY MOONEYHAM is a 33 y.o. female present for OV for  Neck pain (Primary)/vertigo/muscle spasm Suspect neck spasms with trigger point close the discomfort.  Vertigo first presenting symptom, leading to neck discomfort. - methylPREDNISolone acetate (DEPO-MEDROL) injection 80 mg -Continue meclizine as needed -IM Depo-Medrol injection provided today followed by prednisone taper for additional 9 days to start tomorrow. -Patient has Zofran if needed. -Zanaflex 2-4 mg 3 times daily as needed -heating pad can be helpful.   Reviewed expectations re: course of current medical issues. Discussed self-management of symptoms. Outlined signs and symptoms indicating need for more acute intervention. Patient verbalized understanding and all questions were answered. Patient received an After-Visit Summary.    No orders of the defined types were placed in this encounter.  Meds ordered this encounter  Medications   tiZANidine (ZANAFLEX) 4 MG tablet    Sig: Take 0.5-1 tablets (2-4 mg total) by mouth every 8 (eight) hours  as needed for muscle spasms.    Dispense:  60 tablet    Refill:  0   predniSONE (DELTASONE) 20 MG tablet    Sig: 60 mg x2d, 40 mg x3d, 20 mg x2d, 10 mg x2d    Dispense:  15 tablet    Refill:  0   Referral Orders  No referral(s) requested today     Note is dictated utilizing voice recognition software. Although note has been proof read prior to signing, occasional typographical errors still can be missed. If any questions arise, please do not hesitate to call for verification.   electronically signed by:  Felix Pacini, DO  [facility] - [place]

## 2023-07-10 NOTE — Patient Instructions (Addendum)
 Return in about 4 weeks (around 08/07/2023), or if symptoms worsen or fail to improve.        Great to see you today.  I have refilled the medication(s) we provide.   If labs were collected or images ordered, we will inform you of  results once we have received them and reviewed. We will contact you either by echart message, or telephone call.  Please give ample time to the testing facility, and our office to run,  receive and review results. Please do not call inquiring of results, even if you can see them in your chart. We will contact you as soon as we are able. If it has been over 1 week since the test was completed, and you have not yet heard from [place], then please call us.    - echart message- for normal results that have been seen by the patient already.   - telephone call: abnormal results or if patient has not viewed results in their echart.  If a referral to a specialist was entered for you, please call us in 2 weeks if you have not heard from the specialist office to schedule.

## 2023-07-25 ENCOUNTER — Other Ambulatory Visit (HOSPITAL_COMMUNITY): Payer: Self-pay | Admitting: Psychiatry

## 2023-07-25 ENCOUNTER — Other Ambulatory Visit (HOSPITAL_BASED_OUTPATIENT_CLINIC_OR_DEPARTMENT_OTHER): Payer: Self-pay

## 2023-07-25 ENCOUNTER — Telehealth (HOSPITAL_COMMUNITY): Payer: Self-pay | Admitting: *Deleted

## 2023-07-25 DIAGNOSIS — F988 Other specified behavioral and emotional disorders with onset usually occurring in childhood and adolescence: Secondary | ICD-10-CM

## 2023-07-25 NOTE — Telephone Encounter (Signed)
 Pt's husband, Elita Quick, called requesting refill of the Adderall XR 20 mg. This medication was last e-scribed and filled per pt pharmacy, on 07/02/23, therefore earliest refill date is 08/01/23. This nurse advised Mr. Ke of this and he verbalized understanding and asked that a refill be sent to pharmacy [facility]) with that start date. Pt was last seen on 06/06/23 and has f/u scheduled for 09/03/23. Please review.

## 2023-07-27 ENCOUNTER — Encounter (HOSPITAL_COMMUNITY): Payer: Self-pay

## 2023-07-27 ENCOUNTER — Other Ambulatory Visit (HOSPITAL_BASED_OUTPATIENT_CLINIC_OR_DEPARTMENT_OTHER): Payer: Self-pay

## 2023-07-27 MED ORDER — AMPHETAMINE-DEXTROAMPHET ER 20 MG PO CP24
20.0000 mg | ORAL_CAPSULE | Freq: Every day | ORAL | 0 refills | Status: DC
  Filled 2023-08-01: qty 30, 30d supply, fill #0

## 2023-07-27 NOTE — Telephone Encounter (Signed)
Done. She can pick up on her due date. 

## 2023-07-31 ENCOUNTER — Other Ambulatory Visit (HOSPITAL_BASED_OUTPATIENT_CLINIC_OR_DEPARTMENT_OTHER): Payer: Self-pay

## 2023-08-01 ENCOUNTER — Other Ambulatory Visit (HOSPITAL_BASED_OUTPATIENT_CLINIC_OR_DEPARTMENT_OTHER): Payer: Self-pay

## 2023-08-27 ENCOUNTER — Other Ambulatory Visit (HOSPITAL_COMMUNITY): Payer: Self-pay

## 2023-08-27 ENCOUNTER — Other Ambulatory Visit (HOSPITAL_COMMUNITY): Payer: Self-pay | Admitting: Psychiatry

## 2023-08-27 ENCOUNTER — Telehealth (HOSPITAL_COMMUNITY): Payer: Self-pay

## 2023-08-27 ENCOUNTER — Other Ambulatory Visit (HOSPITAL_BASED_OUTPATIENT_CLINIC_OR_DEPARTMENT_OTHER): Payer: Self-pay

## 2023-08-27 DIAGNOSIS — F411 Generalized anxiety disorder: Secondary | ICD-10-CM

## 2023-08-27 DIAGNOSIS — F952 Tourette's disorder: Secondary | ICD-10-CM

## 2023-08-27 DIAGNOSIS — F988 Other specified behavioral and emotional disorders with onset usually occurring in childhood and adolescence: Secondary | ICD-10-CM

## 2023-08-27 MED ORDER — AMPHETAMINE-DEXTROAMPHET ER 20 MG PO CP24
20.0000 mg | ORAL_CAPSULE | Freq: Every day | ORAL | 0 refills | Status: DC
  Filled 2023-08-31: qty 30, 30d supply, fill #0

## 2023-08-27 NOTE — Telephone Encounter (Signed)
 Prescription sent.  Patient can pick up the medication on her due date.

## 2023-08-27 NOTE — Telephone Encounter (Signed)
 Patient is calling to request a refill on her Adderall  last filled on 08/01/2023. Patient has a follow up on 09/03/2023 but will run a couple of days short. Patient uses [facility]

## 2023-08-28 ENCOUNTER — Other Ambulatory Visit (HOSPITAL_BASED_OUTPATIENT_CLINIC_OR_DEPARTMENT_OTHER): Payer: Self-pay

## 2023-08-31 ENCOUNTER — Other Ambulatory Visit (HOSPITAL_BASED_OUTPATIENT_CLINIC_OR_DEPARTMENT_OTHER): Payer: Self-pay

## 2023-09-03 ENCOUNTER — Other Ambulatory Visit (HOSPITAL_BASED_OUTPATIENT_CLINIC_OR_DEPARTMENT_OTHER): Payer: Self-pay

## 2023-09-03 ENCOUNTER — Telehealth (HOSPITAL_BASED_OUTPATIENT_CLINIC_OR_DEPARTMENT_OTHER): Payer: Self-pay | Admitting: Psychiatry

## 2023-09-03 ENCOUNTER — Encounter (HOSPITAL_COMMUNITY): Payer: Self-pay | Admitting: Psychiatry

## 2023-09-03 DIAGNOSIS — F411 Generalized anxiety disorder: Secondary | ICD-10-CM | POA: Diagnosis not present

## 2023-09-03 DIAGNOSIS — F952 Tourette's disorder: Secondary | ICD-10-CM

## 2023-09-03 DIAGNOSIS — F988 Other specified behavioral and emotional disorders with onset usually occurring in childhood and adolescence: Secondary | ICD-10-CM | POA: Diagnosis not present

## 2023-09-03 MED ORDER — BUPROPION HCL ER (XL) 150 MG PO TB24
150.0000 mg | ORAL_TABLET | ORAL | 2 refills | Status: DC
Start: 2023-09-03 — End: 2023-12-04
  Filled 2023-09-03 – 2023-10-03 (×2): qty 30, 30d supply, fill #0
  Filled 2023-11-05: qty 30, 30d supply, fill #1

## 2023-09-03 MED ORDER — AMPHETAMINE-DEXTROAMPHET ER 20 MG PO CP24
20.0000 mg | ORAL_CAPSULE | Freq: Every day | ORAL | 0 refills | Status: DC
  Filled 2023-10-03: qty 30, 30d supply, fill #0

## 2023-09-03 NOTE — Progress Notes (Signed)
 [facility] MD Virtual Progress Note   Patient Location: Work Provider Location: Home Office  I connect with patient by video and verified that I am speaking with correct person by using two identifiers. I discussed the limitations of evaluation and management by telemedicine and the availability of in person appointments. I also discussed with the patient that there may be a patient responsible charge related to this service. The patient expressed understanding and agreed to proceed.  Cindy Jones [id number] 33 y.o.  09/03/2023 9:59 AM  History of Present Illness:  Patient is evaluated by video session.  She is taking the medication as prescribed.  She admitted some time her husband pick up the medication a few days early however I reminded controlled substance cannot be picked earlier unless there is excuse.  She acknowledged and she will make sure not to pick up earlier medication.  Otherwise she feels the medicine helping and keeping her stable.  She has no struggle with attention, focus and her job is going well.  She enjoys the company of her 71-month-old son.  Her husband finish his rotation and now back to work and patient is hoping next year he can finish his school.  Patient is sleeping okay.  She reported continued to have ticks but does not interfere in her daily activities.  Her energy level is good.  She is trying to focus on her diet and also started more physical activity.  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  She lost few pounds since the last visit.  Patient denies drinking or using any illegal substances.  She works Monday to Wednesday in person and rest of the week from home.  She works for a [employer], [employer].  She has no tremors, shakes or rash.  She like to keep the Wellbutrin  and Adderall .  She denies any panic attack, crying spells or any feeling of hopelessness or worthlessness.  She like to try a nonstimulant but not sure if her insurance  will cover.  Her insurance recently changed.  Past Psychiatric History: H/O bulimia nervosa, Tourette with tics, anxiety , ADHD and depression. Saw Dr. Katrine Parody since 2015.  Ritalin made angry and Vyvanse  did not work.  On meds since age 66 until age 33 when got pregnant.  Restarted medication in 2019 and added Wellbutrin . On Adderall  XR 20 mg for many years but given 5 mg IR in the school.     Outpatient Encounter Medications as of 09/03/2023  Medication Sig   amphetamine -dextroamphetamine  (ADDERALL  XR) 20 MG 24 hr capsule Take 1 capsule (20 mg total) by mouth daily.   buPROPion  (WELLBUTRIN  XL) 150 MG 24 hr tablet Take 1 tablet (150 mg total) by mouth every morning.   cholecalciferol (VITAMIN D3) 25 MCG (1000 UNIT) tablet Take 1,000 Units by mouth daily.   tiZANidine  (ZANAFLEX ) 4 MG tablet Take 0.5-1 tablets (2-4 mg total) by mouth every 8 (eight) hours as needed for muscle spasms.   No facility-administered encounter medications on file as of 09/03/2023.    No results found for this or any previous visit (from the past 2160 hours).   Psychiatric Specialty Exam: Physical Exam  Review of Systems  Weight 185 lb (83.9 kg), not currently breastfeeding.There is no height or weight on file to calculate BMI.  General Appearance: Casual  Eye Contact:  Good  Speech:  Clear and Coherent  Volume:  Normal  Mood:  Euthymic  Affect:  Appropriate  Thought Process:  Goal Directed  Orientation:  Full (Time, Place, and Person)  Thought Content:  WDL and Logical  Suicidal Thoughts:  No  Homicidal Thoughts:  No  Memory:  Immediate;   Good Recent;   Good Remote;   Good  Judgement:  Good  Insight:  Good  Psychomotor Activity:  Normal  Concentration:  Concentration: Good and Attention Span: Good  Recall:  Good  Fund of Knowledge:  Good  Language:  Good  Akathisia:  No  Handed:  Right  AIMS (if indicated):     Assets:  Communication Skills Desire for Improvement Housing Social  Support Talents/Skills Transportation  ADL's:  Intact  Cognition:  WNL  Sleep:  ok       07/10/2023    1:09 PM 11/13/2022    9:21 AM 08/31/2022    9:38 AM 08/10/2022   10:08 AM 05/25/2022   11:42 AM  Depression screen PHQ 2/9  Decreased Interest 0 0 0 0 1  Down, Depressed, Hopeless 0 0 0 0 1  PHQ - 2 Score 0 0 0 0 2  Altered sleeping  0   1  Tired, decreased energy  1   3  Change in appetite  0   0  Feeling bad or failure about yourself   0   1  Trouble concentrating  0   1  Moving slowly or fidgety/restless  0   0  Suicidal thoughts  0   0  PHQ-9 Score  1   8  Difficult doing work/chores     Somewhat difficult    Assessment/Plan: ADD (attention deficit disorder) without hyperactivity - Plan: buPROPion  (WELLBUTRIN  XL) 150 MG 24 hr tablet, amphetamine -dextroamphetamine  (ADDERALL  XR) 20 MG 24 hr capsule  GAD (generalized anxiety disorder) - Plan: buPROPion  (WELLBUTRIN  XL) 150 MG 24 hr tablet  Tourette disease - Plan: buPROPion  (WELLBUTRIN  XL) 150 MG 24 hr tablet  Discussed controlled substance policy not to be given early.  Patient acknowledged and she will make sure to take the medication as prescribed and fill the prescription on the due date.  She also like to try nonstimulant but not sure if insurance will cover since recently insurance change.  I encouraged to contact insurance company to have list of medication that is covered so we can try.  Overall medicine is helping her.  Will continue Adderall  XR 20 mg daily and Wellbutrin  XL 150 mg in the morning.  Recommend to call us  back if she has any question or any concern.  I sent a new prescription that she can pick up on May 28.  Follow-up in 3 months.   Follow Up Instructions:     I discussed the assessment and treatment plan with the patient. The patient was provided an opportunity to ask questions and all were answered. The patient agreed with the plan and demonstrated an understanding of the instructions.   The patient was  advised to call back or seek an in-person evaluation if the symptoms worsen or if the condition fails to improve as anticipated.    Collaboration of Care: Other provider involved in patient's care AEB notes are available in epic to review  Patient/Guardian was advised Release of Information must be obtained prior to any record release in order to collaborate their care with an outside provider. Patient/Guardian was advised if they have not already done so to contact the registration department to sign all necessary forms in order for us  to release information regarding their care.   Consent: Patient/Guardian gives verbal consent  for treatment and assignment of benefits for services provided during this visit. Patient/Guardian expressed understanding and agreed to proceed.     Total encounter time 24 minutes which includes face-to-face time, chart reviewed, care coordination, order entry and documentation during this encounter.   Note: This document was prepared by [employer] voice dictation technology and any errors that results from this process are unintentional.    Arturo Late, MD 09/03/2023

## 2023-10-03 ENCOUNTER — Other Ambulatory Visit (HOSPITAL_BASED_OUTPATIENT_CLINIC_OR_DEPARTMENT_OTHER): Payer: Self-pay

## 2023-11-05 ENCOUNTER — Other Ambulatory Visit (HOSPITAL_COMMUNITY): Payer: Self-pay | Admitting: Psychiatry

## 2023-11-05 DIAGNOSIS — F988 Other specified behavioral and emotional disorders with onset usually occurring in childhood and adolescence: Secondary | ICD-10-CM

## 2023-11-07 ENCOUNTER — Other Ambulatory Visit (HOSPITAL_BASED_OUTPATIENT_CLINIC_OR_DEPARTMENT_OTHER): Payer: Self-pay

## 2023-11-09 ENCOUNTER — Other Ambulatory Visit (HOSPITAL_BASED_OUTPATIENT_CLINIC_OR_DEPARTMENT_OTHER): Payer: Self-pay

## 2023-11-09 MED ORDER — AMPHETAMINE-DEXTROAMPHET ER 20 MG PO CP24
20.0000 mg | ORAL_CAPSULE | Freq: Every day | ORAL | 0 refills | Status: DC
  Filled 2023-11-09: qty 30, 30d supply, fill #0

## 2023-12-04 ENCOUNTER — Telehealth (HOSPITAL_COMMUNITY): Payer: Self-pay | Admitting: Psychiatry

## 2023-12-04 ENCOUNTER — Encounter (HOSPITAL_COMMUNITY): Payer: Self-pay | Admitting: Psychiatry

## 2023-12-04 ENCOUNTER — Other Ambulatory Visit (HOSPITAL_BASED_OUTPATIENT_CLINIC_OR_DEPARTMENT_OTHER): Payer: Self-pay

## 2023-12-04 VITALS — Wt 180.0 lb

## 2023-12-04 DIAGNOSIS — F411 Generalized anxiety disorder: Secondary | ICD-10-CM

## 2023-12-04 DIAGNOSIS — F988 Other specified behavioral and emotional disorders with onset usually occurring in childhood and adolescence: Secondary | ICD-10-CM

## 2023-12-04 DIAGNOSIS — F952 Tourette's disorder: Secondary | ICD-10-CM | POA: Diagnosis not present

## 2023-12-04 MED ORDER — BUPROPION HCL ER (XL) 150 MG PO TB24
150.0000 mg | ORAL_TABLET | ORAL | 2 refills | Status: DC
  Filled 2023-12-04: qty 30, 30d supply, fill #0
  Filled 2024-01-11 (×2): qty 30, 30d supply, fill #1
  Filled 2024-02-12: qty 30, 30d supply, fill #2

## 2023-12-04 MED ORDER — AMPHETAMINE-DEXTROAMPHET ER 20 MG PO CP24
20.0000 mg | ORAL_CAPSULE | Freq: Every day | ORAL | 0 refills | Status: DC
  Filled 2023-12-10: qty 30, 30d supply, fill #0
  Filled ????-??-??: fill #0

## 2023-12-04 NOTE — Progress Notes (Signed)
 [facility] MD Virtual Progress Note   Patient Location: Work Provider Location: Home Office  I connect with patient by video and verified that I am speaking with correct person by using two identifiers. I discussed the limitations of evaluation and management by telemedicine and the availability of in person appointments. I also discussed with the patient that there may be a patient responsible charge related to this service. The patient expressed understanding and agreed to proceed.  Cindy Jones [id number] 33 y.o.  12/04/2023 9:57 AM  History of Present Illness:  Patient is evaluated by video session.  She reported things are going well.  She is trying to lose weight and watching her calorie intake.  She lost few pounds since the last visit.  She is taking Adderall  when she is working and trying not to take on the weekends.  Patient told her son is now 64-month and going well.  She reported her job is going okay and she does not struggle with multitasking and does not get distracted.  Her focus attention is good.  She is hoping her husband finishes a school next year.  Patient admitted sometime she get anxious started to have tick her symptoms are manageable.  She is sleeping good.  She denies drinking or using any illegal substances.  She is working Monday to Wednesday in person and then the rest of the week from home.  She works for a Administrator, [employer].  She is excited about upcoming trip to beach next week for few days.  Patient did contact her insurance company to get the list of nonstimulant and she promised to give us  the names and send it to our office.  She is compliant with Wellbutrin  which is helping her anxiety and denies any major panic attack, crying spells.  She denies any hopelessness or worthlessness.  Past Psychiatric History: H/O bulimia nervosa, Tourette with tics, anxiety , ADHD and depression. Saw Dr. Brutus since 2015.  Ritalin made angry and Vyvanse  did  not work.  On meds since age 11 until age 12 when got pregnant.  Restarted medication in 2019 and added Wellbutrin . On Adderall  XR 20 mg for many years but given 5 mg IR in the school.    Past Medical History:  Diagnosis Date   Anxiety    Asthma    as a child   Binge eating disorder    Dysrhythmia     I feel it about once a month   Frequent headaches    GAD (generalized anxiety disorder)    GERD (gastroesophageal reflux disease)    History of acute PID 06/09/2019   Major depression in complete remission (HCC)    Migraines    PID (acute pelvic inflammatory disease)    Seasonal allergies    Tourette disorder    Urinary tract bacterial infections     Outpatient Encounter Medications as of 12/04/2023  Medication Sig   amphetamine -dextroamphetamine  (ADDERALL  XR) 20 MG 24 hr capsule Take 1 capsule (20 mg total) by mouth daily.   buPROPion  (WELLBUTRIN  XL) 150 MG 24 hr tablet Take 1 tablet (150 mg total) by mouth every morning.   cholecalciferol (VITAMIN D3) 25 MCG (1000 UNIT) tablet Take 1,000 Units by mouth daily.   tiZANidine  (ZANAFLEX ) 4 MG tablet Take 0.5-1 tablets (2-4 mg total) by mouth every 8 (eight) hours as needed for muscle spasms.   No facility-administered encounter medications on file as of 12/04/2023.    No results found for this or any  previous visit (from the past 2160 hours).   Psychiatric Specialty Exam: Physical Exam  Review of Systems  Weight 180 lb (81.6 kg), not currently breastfeeding.There is no height or weight on file to calculate BMI.  General Appearance: Casual  Eye Contact:  Good  Speech:  Clear and Coherent and Normal Rate  Volume:  Normal  Mood:  Euthymic  Affect:  Appropriate  Thought Process:  Goal Directed  Orientation:  Full (Time, Place, and Person)  Thought Content:  Logical  Suicidal Thoughts:  No  Homicidal Thoughts:  No  Memory:  Immediate;   Good Recent;   Good Remote;   Good  Judgement:  Good  Insight:  Good  Psychomotor Activity:   Normal  Concentration:  Concentration: Good and Attention Span: Good  Recall:  Good  Fund of Knowledge:  Good  Language:  Good  Akathisia:  No  Handed:  Right  AIMS (if indicated):     Assets:  Communication Skills Desire for Improvement Housing Resilience Social Support Talents/Skills Transportation  ADL's:  Intact  Cognition:  WNL  Sleep:  ok       07/10/2023    1:09 PM 11/13/2022    9:21 AM 08/31/2022    9:38 AM 08/10/2022   10:08 AM 05/25/2022   11:42 AM  Depression screen PHQ 2/9  Decreased Interest 0 0 0 0 1  Down, Depressed, Hopeless 0 0 0 0 1  PHQ - 2 Score 0 0 0 0 2  Altered sleeping  0   1  Tired, decreased energy  1   3  Change in appetite  0   0  Feeling bad or failure about yourself   0   1  Trouble concentrating  0   1  Moving slowly or fidgety/restless  0   0  Suicidal thoughts  0   0  PHQ-9 Score  1   8  Difficult doing work/chores     Somewhat difficult    Assessment/Plan: GAD (generalized anxiety disorder) - Plan: buPROPion  (WELLBUTRIN  XL) 150 MG 24 hr tablet  ADD (attention deficit disorder) without hyperactivity - Plan: amphetamine -dextroamphetamine  (ADDERALL  XR) 20 MG 24 hr capsule, buPROPion  (WELLBUTRIN  XL) 150 MG 24 hr tablet  Tourette disease - Plan: amphetamine -dextroamphetamine  (ADDERALL  XR) 20 MG 24 hr capsule, buPROPion  (WELLBUTRIN  XL) 150 MG 24 hr tablet  Patient is stable on Wellbutrin  XL 150 mg and Adderall  XR 20 mg Monday to Friday.  So far no major concern or side effects.  Her symptoms are stable.  She is able to do multitasking denies any panic attack.  Patient will provide us  the list of nonstimulant medication that her insurance approved.  Once we have the list of the medication we will go over the medication.  Continue Adderall  XR 40 mg daily Monday to Friday and Wellbutrin  XL 150 mg in the morning.  Recommended to call back if she has any question or any concern.  Follow-up in 3 months.   Follow Up Instructions:     I discussed  the assessment and treatment plan with the patient. The patient was provided an opportunity to ask questions and all were answered. The patient agreed with the plan and demonstrated an understanding of the instructions.   The patient was advised to call back or seek an in-person evaluation if the symptoms worsen or if the condition fails to improve as anticipated.    Collaboration of Care: Other provider involved in patient's care AEB notes are available in epic  to review  Patient/Guardian was advised Release of Information must be obtained prior to any record release in order to collaborate their care with an outside provider. Patient/Guardian was advised if they have not already done so to contact the registration department to sign all necessary forms in order for us  to release information regarding their care.   Consent: Patient/Guardian gives verbal consent for treatment and assignment of benefits for services provided during this visit. Patient/Guardian expressed understanding and agreed to proceed.     Total encounter time 25 minutes which includes face-to-face time, chart reviewed, care coordination, order entry and documentation during this encounter.   Note: This document was prepared by [employer] voice dictation technology and any errors that results from this process are unintentional.    Leni ONEIDA Client, MD 12/04/2023

## 2023-12-10 ENCOUNTER — Other Ambulatory Visit: Payer: Self-pay

## 2023-12-10 ENCOUNTER — Other Ambulatory Visit (HOSPITAL_BASED_OUTPATIENT_CLINIC_OR_DEPARTMENT_OTHER): Payer: Self-pay

## 2023-12-24 ENCOUNTER — Other Ambulatory Visit (HOSPITAL_BASED_OUTPATIENT_CLINIC_OR_DEPARTMENT_OTHER): Payer: Self-pay

## 2023-12-24 ENCOUNTER — Telehealth: Payer: Self-pay | Admitting: Physician Assistant

## 2023-12-24 DIAGNOSIS — R3989 Other symptoms and signs involving the genitourinary system: Secondary | ICD-10-CM

## 2023-12-24 DIAGNOSIS — B3731 Acute candidiasis of vulva and vagina: Secondary | ICD-10-CM | POA: Diagnosis not present

## 2023-12-24 MED ORDER — FLUCONAZOLE 150 MG PO TABS
150.0000 mg | ORAL_TABLET | ORAL | 0 refills | Status: DC | PRN
  Filled 2023-12-24: qty 2, 6d supply, fill #0

## 2023-12-24 MED ORDER — CEPHALEXIN 500 MG PO CAPS
500.0000 mg | ORAL_CAPSULE | Freq: Two times a day (BID) | ORAL | 0 refills | Status: DC
  Filled 2023-12-24: qty 14, 7d supply, fill #0

## 2023-12-24 NOTE — Progress Notes (Signed)
 E-Visit for Urinary Problems  We are sorry that you are not feeling well.  Here is how we plan to help!  Based on what you shared with me it looks like you most likely have a simple urinary tract infection.  A UTI (Urinary Tract Infection) is a bacterial infection of the bladder.  Most cases of urinary tract infections are simple to treat but a key part of your care is to encourage you to drink plenty of fluids and watch your symptoms carefully.  I have prescribed Keflex  500 mg twice a day for 7 days.  Your symptoms should gradually improve. Call us  if the burning in your urine worsens, you develop worsening fever, back pain or pelvic pain or if your symptoms do not resolve after completing the antibiotic.  Urinary tract infections can be prevented by drinking plenty of water to keep your body hydrated.  Also be sure when you wipe, wipe from front to back and don't hold it in!  If possible, empty your bladder every 4 hours.  Diflucan  given for possible yeast vaginitis. Take one tablet now and one once Keflex  completed.    HOME CARE Drink plenty of fluids Compete the full course of the antibiotics even if the symptoms resolve Remember, when you need to go.go. Holding in your urine can increase the likelihood of getting a UTI! GET HELP RIGHT AWAY IF: You cannot urinate You get a high fever Worsening back pain occurs You see blood in your urine You feel sick to your stomach or throw up You feel like you are going to pass out  MAKE SURE YOU  Understand these instructions. Will watch your condition. Will get help right away if you are not doing well or get worse.   Thank you for choosing an e-visit.  Your e-visit answers were reviewed by a board certified advanced clinical practitioner to complete your personal care plan. Depending upon the condition, your plan could have included both over the counter or prescription medications.  Please review your pharmacy choice. Make sure the  pharmacy is open so you can pick up prescription now. If there is a problem, you may contact your provider through [employer] and have the prescription routed to another pharmacy.  Your safety is important to us . If you have drug allergies check your prescription carefully.   For the next 24 hours you can use MyChart to ask questions about today's visit, request a non-urgent call back, or ask for a work or school excuse. You will get an email in the next two days asking about your experience. I hope that your e-visit has been valuable and will speed your recovery.     I have spent 5 minutes in review of e-visit questionnaire, review and updating patient chart, medical decision making and response to patient.   Delon CHRISTELLA Dickinson, PA-C

## 2024-01-04 ENCOUNTER — Encounter: Payer: Self-pay | Admitting: Family Medicine

## 2024-01-11 ENCOUNTER — Other Ambulatory Visit (HOSPITAL_BASED_OUTPATIENT_CLINIC_OR_DEPARTMENT_OTHER): Payer: Self-pay

## 2024-01-14 ENCOUNTER — Other Ambulatory Visit: Payer: Self-pay

## 2024-01-15 ENCOUNTER — Encounter (HOSPITAL_COMMUNITY): Payer: Self-pay

## 2024-01-15 DIAGNOSIS — F988 Other specified behavioral and emotional disorders with onset usually occurring in childhood and adolescence: Secondary | ICD-10-CM

## 2024-01-15 DIAGNOSIS — F952 Tourette's disorder: Secondary | ICD-10-CM

## 2024-01-15 NOTE — Telephone Encounter (Signed)
Pt is requesting a refill of Adderall XR 20 mg

## 2024-01-16 ENCOUNTER — Other Ambulatory Visit (HOSPITAL_BASED_OUTPATIENT_CLINIC_OR_DEPARTMENT_OTHER): Payer: Self-pay

## 2024-01-16 MED ORDER — AMPHETAMINE-DEXTROAMPHET ER 20 MG PO CP24
20.0000 mg | ORAL_CAPSULE | Freq: Every day | ORAL | 0 refills | Status: DC
  Filled 2024-01-17: qty 30, 30d supply, fill #0

## 2024-01-16 NOTE — Telephone Encounter (Signed)
 Done

## 2024-01-17 ENCOUNTER — Other Ambulatory Visit (HOSPITAL_BASED_OUTPATIENT_CLINIC_OR_DEPARTMENT_OTHER): Payer: Self-pay

## 2024-02-06 ENCOUNTER — Ambulatory Visit: Payer: Self-pay | Admitting: Family Medicine

## 2024-02-06 ENCOUNTER — Encounter: Payer: Self-pay | Admitting: Family Medicine

## 2024-02-06 VITALS — BP 110/72 | HR 84 | Temp 98.3°F | Ht 66.0 in | Wt 166.0 lb

## 2024-02-06 DIAGNOSIS — Z23 Encounter for immunization: Secondary | ICD-10-CM

## 2024-02-06 DIAGNOSIS — E663 Overweight: Secondary | ICD-10-CM | POA: Diagnosis not present

## 2024-02-06 DIAGNOSIS — Z13220 Encounter for screening for lipoid disorders: Secondary | ICD-10-CM | POA: Diagnosis not present

## 2024-02-06 DIAGNOSIS — Z0000 Encounter for general adult medical examination without abnormal findings: Secondary | ICD-10-CM

## 2024-02-06 DIAGNOSIS — Z131 Encounter for screening for diabetes mellitus: Secondary | ICD-10-CM

## 2024-02-06 LAB — LIPID PANEL
Cholesterol: 105 mg/dL (ref 0–200)
HDL: 41.4 mg/dL (ref >39.00)
LDL Cholesterol: 52 mg/dL (ref 0–99)
NonHDL: 63.67
Total CHOL/HDL Ratio: 3
Triglycerides: 56 mg/dL (ref 0.0–149.0)
VLDL: 11.2 mg/dL (ref 0.0–40.0)

## 2024-02-06 LAB — COMPREHENSIVE METABOLIC PANEL WITH GFR
ALT: 21 U/L (ref 0–35)
AST: 17 U/L (ref 0–37)
Albumin: 4.7 g/dL (ref 3.5–5.2)
Alkaline Phosphatase: 40 U/L (ref 39–117)
BUN: 13 mg/dL (ref 6–23)
CO2: 31 meq/L (ref 19–32)
Calcium: 8.9 mg/dL (ref 8.4–10.5)
Chloride: 102 meq/L (ref 96–112)
Creatinine, Ser: 0.75 mg/dL (ref 0.40–1.20)
GFR: 104.76 mL/min
Glucose, Bld: 83 mg/dL (ref 70–99)
Potassium: 4.5 meq/L (ref 3.5–5.1)
Sodium: 139 meq/L (ref 135–145)
Total Bilirubin: 0.5 mg/dL (ref 0.2–1.2)
Total Protein: 6.9 g/dL (ref 6.0–8.3)

## 2024-02-06 LAB — HEMOGLOBIN A1C: Hgb A1c MFr Bld: 5.3 % (ref 4.6–6.5)

## 2024-02-06 LAB — TSH: TSH: 1.4 u[IU]/mL (ref 0.35–5.50)

## 2024-02-06 LAB — CBC
HCT: 40.2 % (ref 36.0–46.0)
Hemoglobin: 13.4 g/dL (ref 12.0–15.0)
MCHC: 33.4 g/dL (ref 30.0–36.0)
MCV: 90.9 fl (ref 78.0–100.0)
Platelets: 211 K/uL (ref 150.0–400.0)
RBC: 4.42 Mil/uL (ref 3.87–5.11)
RDW: 13.3 % (ref 11.5–15.5)
WBC: 6.3 K/uL (ref 4.0–10.5)

## 2024-02-06 NOTE — Progress Notes (Signed)
 Patient ID: Cindy Jones, female  DOB: 1990-10-28, 33 y.o.   MRN: [id number] Patient Care Team    Relationship Specialty Notifications Start End  Catherine Charlies LABOR, DO PCP - General Family Medicine  11/10/22   Sheldon Standing, MD Consulting Physician General Surgery  01/31/16   Armbruster, Standing SQUIBB, MD Consulting Physician Gastroenterology  01/31/16   Fredirick Glenys RAMAN, MD Consulting Physician Obstetrics and Gynecology  10/27/19   Skeet Juliene SAUNDERS, DO Consulting Physician Neurology  08/09/20   Kandis Devaughn Sayres, MD Consulting Physician Obstetrics and Gynecology  05/04/22   Lola Donnice HERO, MD Consulting Physician Family Medicine  11/13/22   Sheena Pugh, DO Consulting Physician Cardiology  11/13/22     Chief Complaint  Patient presents with   Annual Exam    Pt is fasting.     Subjective: Cindy Jones is a 33 y.o.  Female  present for CPE. All past medical history, surgical history, allergies, family history, immunizations, medications and social history were updated in the electronic medical record today. All recent labs, ED visits and hospitalizations within the last year were reviewed.  Health maintenance:  Colonoscopy:routine screen at 59,  EGD completed 12/2015 Dr. Leigh Mammogram: No Fhx screen at 40 Cervical cancer screening: last pap:10/2022, GYN Immunizations: tdap UTD 2023, Influenza  completed today(encouraged yearly) Infectious disease screening: HIV completed 2015, hep c completed DEXA: N/A Patient has a Dental home. Hospitalizations/ED visits: reviewed     02/06/2024    9:22 AM 07/10/2023    1:09 PM 11/13/2022    9:21 AM 08/31/2022    9:38 AM 08/10/2022   10:08 AM  Depression screen PHQ 2/9  Decreased Interest 0 0 0    Down, Depressed, Hopeless 0 0 0    PHQ - 2 Score 0 0 0    Altered sleeping 0  0    Tired, decreased energy 1  1    Change in appetite 0  0    Feeling bad or failure about yourself  1  0    Trouble concentrating 0  0    Moving slowly or  fidgety/restless 0  0    Suicidal thoughts 0  0    PHQ-9 Score 2  1    Difficult doing work/chores Not difficult at all         Information is confidential and restricted. Go to Review Flowsheets to unlock data.      02/06/2024    9:22 AM 11/13/2022    9:21 AM 02/22/2022   11:38 AM 01/25/2022   12:29 PM  GAD 7 : Generalized Anxiety Score  Nervous, Anxious, on Edge 2 1 1 2   Control/stop worrying 2 0 0 1  Worry too much - different things 2 0 0 1  Trouble relaxing 2 1 1 1   Restless 1 0 0 1  Easily annoyed or irritable 0 0 1 0  Afraid - awful might happen 0 0 0 0  Total GAD 7 Score 9 2 3 6   Anxiety Difficulty Somewhat difficult   Not difficult at all           Immunization History  Administered Date(s) Administered    sv, Bivalent, Protein Subunit Rsvpref,pf (Abrysvo ) 04/12/2022   Influenza Inj Mdck Quad With Preservative 03/31/2017, 02/08/2018   Influenza, Seasonal, Injecte, Preservative Fre 02/06/2024   Influenza,inj,Quad PF,6+ Mos 06/08/2014, 01/24/2022   Influenza-Unspecified 02/24/2015, 02/05/2018, 02/18/2019, 05/07/2020, 01/24/2022   Moderna Sars-Covid-2 Vaccination 05/07/2020   PFIZER(Purple Top)SARS-COV-2 Vaccination 07/21/2019,  08/11/2019   Pfizer Covid-19 Theatre manager 57yrs & up 02/14/2021, 02/16/2022   Pfizer(Comirnaty)Fall Seasonal Vaccine 12 years and older 02/16/2022   Td 07/14/2015   Tdap 05/01/2005, 02/22/2022     Past Medical History:  Diagnosis Date   Anxiety    Asthma    as a child   Binge eating disorder    Dysrhythmia     I feel it about once a month   Frequent headaches    GAD (generalized anxiety disorder)    GERD (gastroesophageal reflux disease)    History of acute PID 06/09/2019   Major depression in complete remission    Migraines    PID (acute pelvic inflammatory disease)    Seasonal allergies    Tourette disorder    Urinary tract bacterial infections    Allergies  Allergen Reactions   Latex Swelling and Rash    Barley Grass Hives   Cranberry Juice Powder Other (See Comments)    Throat pain.   Naproxen Other (See Comments)    Liver starts hurting Diarrhea   Nickel     Skin peeling   Sulfa Antibiotics Hives and Swelling   Past Surgical History:  Procedure Laterality Date   CHOLECYSTECTOMY     ESOPHAGOGASTRODUODENOSCOPY     LAPAROSCOPIC CHOLECYSTECTOMY SINGLE SITE WITH INTRAOPERATIVE CHOLANGIOGRAM N/A 03/15/2016   Procedure: LAPAROSCOPIC CHOLECYSTECTOMY SINGLE SITE WITH INTRAOPERATIVE CHOLANGIOGRAM;  Surgeon: Elspeth Schultze, MD;  Location: [facility] OR;  Service: General;  Laterality: N/A;   TONSILLECTOMY AND ADENOIDECTOMY  2013   WISDOM TOOTH EXTRACTION     Family History  Problem Relation Age of Onset   Alcohol abuse Mother    Bipolar disorder Mother    Cancer Father        sarcoma; passed away when pt was 8   Asthma Sister    Allergic rhinitis Sister    Eczema Sister    Emphysema Maternal Grandmother    Tourette syndrome Maternal Grandfather    Emphysema Paternal Grandmother    Heart disease Paternal Grandfather    Tourette syndrome Maternal Aunt    Alcohol abuse Maternal Aunt    Alcohol abuse Maternal Uncle    Hyperlipidemia Paternal Aunt    Hashimoto's thyroiditis Paternal Aunt    Suicidality Neg Hx    Urticaria Neg Hx    Angioedema Neg Hx    Social History   Social History Narrative   - Married, no children.   - Automotive engineer education.   - She works FT as a Patent attorney.   - Her aunt & uncle were her guardians when she was in HS. They live in [place], [place].    - She has a fraternal twin sister.    - Wears her seatbelt, smoke detectors in the home.   Right handed    Allergies as of 02/06/2024       Reactions   Latex Swelling, Rash   Barley Grass Hives   Cranberry Juice Powder Other (See Comments)   Throat pain.   Naproxen Other (See Comments)   Liver starts hurting Diarrhea   Nickel    Skin peeling   Sulfa Antibiotics Hives, Swelling        Medication List         Accurate as of February 06, 2024  9:48 AM. If you have any questions, ask your nurse or doctor.          STOP taking these medications    cephALEXin  500 MG capsule Commonly known as: KEFLEX   Stopped by: [name]   fluconazole  150 MG tablet Commonly known as: DIFLUCAN  Stopped by: [name]   tiZANidine  4 MG tablet Commonly known as: Zanaflex  Stopped by: [name]       TAKE these medications    amphetamine -dextroamphetamine  20 MG 24 hr capsule Commonly known as: ADDERALL  XR Take 1 capsule (20 mg total) by mouth daily.   buPROPion  150 MG 24 hr tablet Commonly known as: Wellbutrin  XL Take 1 tablet (150 mg total) by mouth every morning.   cholecalciferol 25 MCG (1000 UNIT) tablet Commonly known as: VITAMIN D3 Take 1,000 Units by mouth daily.        All past medical history, surgical history, allergies, family history, immunizations andmedications were updated in the EMR today and reviewed under the history and medication portions of their EMR.     ROS: 14 pt review of systems performed and negative (unless mentioned in an HPI)  Objective: BP 110/72   Pulse 84   Temp 98.3 F (36.8 C)   Ht 5' 6 (1.676 m)   Wt 166 lb (75.3 kg)   LMP 02/06/2024   SpO2 98%   BMI 26.79 kg/m  Physical Exam Vitals and nursing note reviewed.  Constitutional:      General: She is not in acute distress.    Appearance: Normal appearance. She is not ill-appearing or toxic-appearing.  HENT:     Head: Normocephalic and atraumatic.     Right Ear: Tympanic membrane, ear canal and external ear normal. There is no impacted cerumen.     Left Ear: Tympanic membrane, ear canal and external ear normal. There is no impacted cerumen.     Nose: No congestion or rhinorrhea.     Mouth/Throat:     Mouth: Mucous membranes are moist.     Pharynx: Oropharynx is clear. No oropharyngeal exudate or posterior oropharyngeal erythema.  Eyes:     General: No scleral icterus.       Right  eye: No discharge.        Left eye: No discharge.     Extraocular Movements: Extraocular movements intact.     Conjunctiva/sclera: Conjunctivae normal.     Pupils: Pupils are equal, round, and reactive to light.  Cardiovascular:     Rate and Rhythm: Normal rate and regular rhythm.     Pulses: Normal pulses.     Heart sounds: Normal heart sounds. No murmur heard.    No friction rub. No gallop.  Pulmonary:     Effort: Pulmonary effort is normal. No respiratory distress.     Breath sounds: Normal breath sounds. No stridor. No wheezing, rhonchi or rales.  Chest:     Chest wall: No tenderness.  Abdominal:     General: Abdomen is flat. Bowel sounds are normal. There is no distension.     Palpations: Abdomen is soft. There is no mass.     Tenderness: There is no abdominal tenderness. There is no right CVA tenderness, left CVA tenderness, guarding or rebound.     Hernia: No hernia is present.  Musculoskeletal:        General: No swelling, tenderness or deformity. Normal range of motion.     Cervical back: Normal range of motion and neck supple. No rigidity or tenderness.     Right lower leg: No edema.     Left lower leg: No edema.  Lymphadenopathy:     Cervical: No cervical adenopathy.  Skin:    General: Skin is warm and dry.  Coloration: Skin is not jaundiced or pale.     Findings: No bruising, erythema, lesion or rash.  Neurological:     General: No focal deficit present.     Mental Status: She is alert and oriented to person, place, and time. Mental status is at baseline.     Cranial Nerves: No cranial nerve deficit.     Sensory: No sensory deficit.     Motor: No weakness.     Coordination: Coordination normal.     Gait: Gait normal.     Deep Tendon Reflexes: Reflexes normal.  Psychiatric:        Mood and Affect: Mood normal.        Behavior: Behavior normal.        Thought Content: Thought content normal.        Judgment: Judgment normal.     No results  found.  Assessment/plan: Cindy Jones is a 33 y.o. female present for annual physical Routine general medical examination at a health care facility Patient was encouraged to exercise greater than 150 minutes a week. Patient was encouraged to choose a diet filled with fresh fruits and vegetables, and lean meats. AVS provided to patient today for education/recommendation on gender specific health and safety maintenance. Colonoscopy:routine screen at 43,  EGD completed 12/2015 Dr. Leigh Mammogram: No Fhx screen at 40 Cervical cancer screening: last pap:10/2022, GYN Immunizations: tdap UTD 2023, Influenza  completed today(encouraged yearly) Infectious disease screening: HIV completed 2015, hep c completed DEXA: N/A Labs collected today   Diabetes mellitus screening/Lipid screening/ E66.3 (BMI 25.0-29.9) - Hemoglobin A1c - Lipid panel  Influenza vaccine needed - Flu vaccine trivalent PF, 6mos and older(Flulaval,Afluria,Fluarix,Fluzone)  Return in about 1 year (around 02/06/2025) for cpe (20 min).  Orders Placed This Encounter  Procedures   Flu vaccine trivalent PF, 6mos and older(Flulaval,Afluria,Fluarix,Fluzone)   CBC   Comprehensive metabolic panel with GFR   Hemoglobin A1c   Lipid panel   TSH   No orders of the defined types were placed in this encounter.  Referral Orders  No referral(s) requested today     Electronically signed by: [name], DO [facility]

## 2024-02-06 NOTE — Patient Instructions (Signed)
 Return in about 1 year (around 02/06/2025) for cpe (20 min).        Great to see you today.  I have refilled the medication(s) we provide.   If labs were collected or images ordered, we will inform you of  results once we have received them and reviewed. We will contact you either by echart message, or telephone call.  Please give ample time to the testing facility, and our office to run,  receive and review results. Please do not call inquiring of results, even if you can see them in your chart. We will contact you as soon as we are able. If it has been over 1 week since the test was completed, and you have not yet heard from us , then please call us .    - echart message- for normal results that have been seen by the patient already.   - telephone call: abnormal results or if patient has not viewed results in their echart.  If a referral to a specialist was entered for you, please call us  in 2 weeks if you have not heard from the specialist office to schedule.

## 2024-02-07 ENCOUNTER — Ambulatory Visit: Payer: Self-pay | Admitting: Family Medicine

## 2024-02-16 ENCOUNTER — Other Ambulatory Visit (HOSPITAL_BASED_OUTPATIENT_CLINIC_OR_DEPARTMENT_OTHER): Payer: Self-pay

## 2024-02-21 ENCOUNTER — Other Ambulatory Visit (HOSPITAL_BASED_OUTPATIENT_CLINIC_OR_DEPARTMENT_OTHER): Payer: Self-pay

## 2024-02-21 ENCOUNTER — Encounter (HOSPITAL_COMMUNITY): Payer: Self-pay

## 2024-02-21 DIAGNOSIS — F952 Tourette's disorder: Secondary | ICD-10-CM

## 2024-02-21 DIAGNOSIS — F988 Other specified behavioral and emotional disorders with onset usually occurring in childhood and adolescence: Secondary | ICD-10-CM

## 2024-02-21 MED ORDER — AMPHETAMINE-DEXTROAMPHET ER 20 MG PO CP24
20.0000 mg | ORAL_CAPSULE | Freq: Every day | ORAL | 0 refills | Status: DC
Start: 2024-02-21 — End: 2024-03-03
  Filled 2024-02-21: qty 30, 30d supply, fill #0

## 2024-02-21 NOTE — Telephone Encounter (Signed)
 Called patient and advised her that her prescription is at the pharmacy

## 2024-02-21 NOTE — Telephone Encounter (Signed)
 Prescription called to the pharmacy.  Please inform the patient.  Thank you.

## 2024-02-22 ENCOUNTER — Other Ambulatory Visit (HOSPITAL_BASED_OUTPATIENT_CLINIC_OR_DEPARTMENT_OTHER): Payer: Self-pay

## 2024-03-03 ENCOUNTER — Telehealth (HOSPITAL_BASED_OUTPATIENT_CLINIC_OR_DEPARTMENT_OTHER): Admitting: Psychiatry

## 2024-03-03 ENCOUNTER — Other Ambulatory Visit (HOSPITAL_BASED_OUTPATIENT_CLINIC_OR_DEPARTMENT_OTHER): Payer: Self-pay

## 2024-03-03 ENCOUNTER — Other Ambulatory Visit (HOSPITAL_COMMUNITY): Payer: Self-pay

## 2024-03-03 ENCOUNTER — Encounter (HOSPITAL_COMMUNITY): Payer: Self-pay | Admitting: Psychiatry

## 2024-03-03 ENCOUNTER — Telehealth (HOSPITAL_COMMUNITY): Payer: Self-pay | Admitting: *Deleted

## 2024-03-03 ENCOUNTER — Encounter (HOSPITAL_COMMUNITY): Payer: Self-pay | Admitting: *Deleted

## 2024-03-03 VITALS — Wt 165.0 lb

## 2024-03-03 DIAGNOSIS — F988 Other specified behavioral and emotional disorders with onset usually occurring in childhood and adolescence: Secondary | ICD-10-CM | POA: Diagnosis not present

## 2024-03-03 DIAGNOSIS — F411 Generalized anxiety disorder: Secondary | ICD-10-CM

## 2024-03-03 DIAGNOSIS — F952 Tourette's disorder: Secondary | ICD-10-CM | POA: Diagnosis not present

## 2024-03-03 MED ORDER — AMPHETAMINE-DEXTROAMPHET ER 20 MG PO CP24: 20.0000 mg | ORAL_CAPSULE | Freq: Every day | ORAL | 0 refills | Status: DC

## 2024-03-03 MED ORDER — BUPROPION HCL ER (XL) 300 MG PO TB24
300.0000 mg | ORAL_TABLET | ORAL | 1 refills | Status: DC
  Filled 2024-03-03: qty 30, 30d supply, fill #0

## 2024-03-03 NOTE — Telephone Encounter (Signed)
 Opened in error

## 2024-03-03 NOTE — Telephone Encounter (Signed)
 Pt asked that prescriptions be sent to [facility] for this month until we can find out if her pharmacy benefits have changed.

## 2024-03-03 NOTE — Progress Notes (Signed)
 [facility] MD Virtual Progress Note   Patient Location: Home Provider Location: Home Office  I connect with patient by video and verified that I am speaking with correct person by using two identifiers. I discussed the limitations of evaluation and management by telemedicine and the availability of in person appointments. I also discussed with the patient that there may be a patient responsible charge related to this service. The patient expressed understanding and agreed to proceed.  Cindy Jones [id number] 33 y.o.  03/03/2024 10:18 AM  History of Present Illness:  Patient is evaluated by video session.  She reported things are going okay but somewhat frustrated with the insurance and pharmacy.  Now she has to get controlled substance from a new pharmacy called [facility].  Apparently her insurance does not have contract with [facility] for controlled substance.  She is able to get Wellbutrin  only.  She also did research on nonstimulant so she can come off from Adderall  but after reading the side effects she preferred to keep the Adderall  but like to go down on the dose.  She is wondering if she can take the higher dose of Wellbutrin  and cut down the Adderall  dose.  She reported otherwise things are going okay.  Now she has to go 4 times a week in person which is required by the job.  She reported things are going okay at home.  Patient lives with her husband who is working as needed and in the school for publishing rights manager.  They have 43-month old toddler.  She reported her job is going okay.  She works for a administrator, civil service.  Her long-term plan is to come off from Adderall .  She denies drinking or using any illegal substances.  She denies any paranoia, crying spells, feeling of hopelessness or worthlessness.  She sleeps okay but sometimes she takes melatonin when she could not sleep.  She noticed when she had increased stress her Tourette comes back.  She has no tremor  or shakes or any EPS.  She denies drinking or using any illegal substances.  Recently she had a physical and blood work.  Labs are normal.  She lost weight since the last visit as trying to be more active and started doing Peloton, walking the dog every day.  She usually do 8-10,000 steps a day.  She takes Adderall  only on the weekdays.  Past Psychiatric History: H/O bulimia nervosa, Tourette with tics, anxiety , ADHD and depression. Saw Dr. Brutus since 2015.  Ritalin made angry and Vyvanse  did not work.  On meds since age 67 until age 89 when got pregnant.  Restarted medication in 2019 and added Wellbutrin . On Adderall  XR 20 mg for many years but given 5 mg IR in the school.    Past Medical History:  Diagnosis Date   Anxiety    Asthma    as a child   Binge eating disorder    Dysrhythmia     I feel it about once a month   Frequent headaches    GAD (generalized anxiety disorder)    GERD (gastroesophageal reflux disease)    History of acute PID 06/09/2019   Major depression in complete remission    Migraines    PID (acute pelvic inflammatory disease)    Seasonal allergies    Tourette disorder    Urinary tract bacterial infections     Outpatient Encounter Medications as of 03/03/2024  Medication Sig   amphetamine -dextroamphetamine  (ADDERALL  XR) 20 MG 24  hr capsule Take 1 capsule (20 mg total) by mouth daily.   buPROPion  (WELLBUTRIN  XL) 150 MG 24 hr tablet Take 1 tablet (150 mg total) by mouth every morning.   cholecalciferol (VITAMIN D3) 25 MCG (1000 UNIT) tablet Take 1,000 Units by mouth daily.   No facility-administered encounter medications on file as of 03/03/2024.    Recent Results (from the past 2160 hours)  CBC     Status: None   Collection Time: 02/06/24  9:19 AM  Result Value Ref Range   WBC 6.3 4.0 - 10.5 K/uL   RBC 4.42 3.87 - 5.11 Mil/uL   Platelets 211.0 150.0 - 400.0 K/uL   Hemoglobin 13.4 12.0 - 15.0 g/dL   HCT 59.7 63.9 - 53.9 %   MCV 90.9 78.0 - 100.0 fl    MCHC 33.4 30.0 - 36.0 g/dL   RDW 86.6 88.4 - 84.4 %  Comprehensive metabolic panel with GFR     Status: None   Collection Time: 02/06/24  9:19 AM  Result Value Ref Range   Sodium 139 135 - 145 mEq/L   Potassium 4.5 3.5 - 5.1 mEq/L   Chloride 102 96 - 112 mEq/L   CO2 31 19 - 32 mEq/L   Glucose, Bld 83 70 - 99 mg/dL   BUN 13 6 - 23 mg/dL   Creatinine, Ser 9.24 0.40 - 1.20 mg/dL   Total Bilirubin 0.5 0.2 - 1.2 mg/dL   Alkaline Phosphatase 40 39 - 117 U/L   AST 17 0 - 37 U/L   ALT 21 0 - 35 U/L   Total Protein 6.9 6.0 - 8.3 g/dL   Albumin 4.7 3.5 - 5.2 g/dL   GFR 895.23 >39.99 mL/min    Comment: Calculated using the CKD-EPI Creatinine Equation (2021)   Calcium 8.9 8.4 - 10.5 mg/dL  Hemoglobin J8r     Status: None   Collection Time: 02/06/24  9:19 AM  Result Value Ref Range   Hgb A1c MFr Bld 5.3 4.6 - 6.5 %    Comment: Glycemic Control Guidelines for People with Diabetes:Non Diabetic:  <6%Goal of Therapy: <7%Additional Action Suggested:  >8%   Lipid panel     Status: None   Collection Time: 02/06/24  9:19 AM  Result Value Ref Range   Cholesterol 105 0 - 200 mg/dL    Comment: ATP III Classification       Desirable:  < 200 mg/dL               Borderline High:  200 - 239 mg/dL          High:  > = 759 mg/dL   Triglycerides 43.9 0.0 - 149.0 mg/dL    Comment: Normal:  <849 mg/dLBorderline High:  150 - 199 mg/dL   HDL 58.59 >60.99 mg/dL   VLDL 88.7 0.0 - 59.9 mg/dL   LDL Cholesterol 52 0 - 99 mg/dL   Total CHOL/HDL Ratio 3     Comment:                Men          Women1/2 Average Risk     3.4          3.3Average Risk          5.0          4.42X Average Risk          9.6          7.13X Average Risk  15.0          11.0                       NonHDL 63.67     Comment: NOTE:  Non-HDL goal should be 30 mg/dL higher than patient's LDL goal (i.e. LDL goal of < 70 mg/dL, would have non-HDL goal of < 100 mg/dL)  TSH     Status: None   Collection Time: 02/06/24  9:19 AM  Result Value Ref  Range   TSH 1.40 0.35 - 5.50 uIU/mL     Psychiatric Specialty Exam: Physical Exam  Review of Systems  Weight 165 lb (74.8 kg), last menstrual period 02/06/2024, not currently breastfeeding.There is no height or weight on file to calculate BMI.  General Appearance: Casual  Eye Contact:  Good  Speech:  Clear and Coherent  Volume:  Normal  Mood:  Euthymic  Affect:  Appropriate  Thought Process:  Goal Directed  Orientation:  Full (Time, Place, and Person)  Thought Content:  Logical  Suicidal Thoughts:  No  Homicidal Thoughts:  No  Memory:  Immediate;   Good Recent;   Good Remote;   Good  Judgement:  Good  Insight:  Present  Psychomotor Activity:  Normal  Concentration:  Concentration: Good and Attention Span: Good  Recall:  Good  Fund of Knowledge:  Good  Language:  Good  Akathisia:  No  Handed:  Right  AIMS (if indicated):     Assets:  Communication Skills Desire for Improvement Housing Resilience Social Support Talents/Skills Transportation  ADL's:  Intact  Cognition:  WNL  Sleep:  ok, some takes melatonin 3 mg        02/06/2024    9:22 AM 07/10/2023    1:09 PM 11/13/2022    9:21 AM 08/31/2022    9:38 AM 08/10/2022   10:08 AM  Depression screen PHQ 2/9  Decreased Interest 0 0 0 0 0  Down, Depressed, Hopeless 0 0 0 0 0  PHQ - 2 Score 0 0 0 0 0  Altered sleeping 0  0    Tired, decreased energy 1  1    Change in appetite 0  0    Feeling bad or failure about yourself  1  0    Trouble concentrating 0  0    Moving slowly or fidgety/restless 0  0    Suicidal thoughts 0  0    PHQ-9 Score 2  1    Difficult doing work/chores Not difficult at all        Assessment/Plan: ADD (attention deficit disorder) without hyperactivity - Plan: buPROPion  (WELLBUTRIN  XL) 300 MG 24 hr tablet  Tourette disease - Plan: buPROPion  (WELLBUTRIN  XL) 300 MG 24 hr tablet  GAD (generalized anxiety disorder) - Plan: buPROPion  (WELLBUTRIN  XL) 300 MG 24 hr tablet  Patient is 33 year old  married employed female with history of generalized anxiety disorder, ADHD, Tourette's disease.  Reviewed collateral information, blood work results, notes from other provider.  Discussed issues getting the medication from a different pharmacy which is not listed.  Will contact the pharmacy at [phone] to get more information.  Discussed going up on Wellbutrin  to 300 and reducing the Adderall  from 20 mg to 15 mg.  Her long-term goal is to come off from stimulants.  Discussed possible side effects with increased Wellbutrin  and reducing Adderall .  So far no major concern.  Encouraged to call back if she noticed worsening of symptoms.  I will  send a new prescription of Wellbutrin  XL 300 mg to her local pharmacy but will wait for the Adderall  to be sent with a new dose of XR 15 mg once we have more information about her new pharmacy which accepts controlled substance.  Discussed to follow-up in 2 months unless needed sooner appointment.  Encouraged to continue walking, exercise.   Follow Up Instructions:     I discussed the assessment and treatment plan with the patient. The patient was provided an opportunity to ask questions and all were answered. The patient agreed with the plan and demonstrated an understanding of the instructions.   The patient was advised to call back or seek an in-person evaluation if the symptoms worsen or if the condition fails to improve as anticipated.    Collaboration of Care: Other provider involved in patient's care AEB notes are available in epic to review.  Patient/Guardian was advised Release of Information must be obtained prior to any record release in order to collaborate their care with an outside provider. Patient/Guardian was advised if they have not already done so to contact the registration department to sign all necessary forms in order for us  to release information regarding their care.   Consent: Patient/Guardian gives verbal consent for treatment and  assignment of benefits for services provided during this visit. Patient/Guardian expressed understanding and agreed to proceed.     Total encounter time 27 minutes which includes face-to-face time, chart reviewed, care coordination, order entry and documentation during this encounter.   Note: This document was prepared by [employer] voice dictation technology and any errors that results from this process are unintentional.    Leni ONEIDA Client, MD 03/03/2024

## 2024-03-03 NOTE — Telephone Encounter (Signed)
 Prescription sent to [facility] pharmacy.  Please inform the patient that she can pick up on her date.  Thank you.

## 2024-03-04 ENCOUNTER — Other Ambulatory Visit (HOSPITAL_COMMUNITY): Payer: Self-pay | Admitting: Psychiatry

## 2024-03-04 ENCOUNTER — Telehealth (HOSPITAL_COMMUNITY): Payer: Self-pay | Admitting: *Deleted

## 2024-03-04 ENCOUNTER — Other Ambulatory Visit (HOSPITAL_COMMUNITY): Payer: Self-pay

## 2024-03-04 ENCOUNTER — Other Ambulatory Visit (HOSPITAL_BASED_OUTPATIENT_CLINIC_OR_DEPARTMENT_OTHER): Payer: Self-pay

## 2024-03-04 DIAGNOSIS — F952 Tourette's disorder: Secondary | ICD-10-CM

## 2024-03-04 DIAGNOSIS — F988 Other specified behavioral and emotional disorders with onset usually occurring in childhood and adolescence: Secondary | ICD-10-CM

## 2024-03-04 MED ORDER — AMPHETAMINE-DEXTROAMPHET ER 20 MG PO CP24
20.0000 mg | ORAL_CAPSULE | Freq: Every day | ORAL | 0 refills | Status: DC
  Filled 2024-03-25 (×2): qty 30, 30d supply, fill #0

## 2024-03-04 NOTE — Telephone Encounter (Signed)
 Pt requests that Adderall  prescription be sent to [facility] and d/c script at [facility].

## 2024-03-04 NOTE — Telephone Encounter (Signed)
 Prescription sent to [facility]. Please call [facility] to cancel script. Thank you.

## 2024-03-04 NOTE — Telephone Encounter (Signed)
 Done

## 2024-03-06 ENCOUNTER — Encounter: Payer: Self-pay | Admitting: Sports Medicine

## 2024-03-06 ENCOUNTER — Other Ambulatory Visit (HOSPITAL_BASED_OUTPATIENT_CLINIC_OR_DEPARTMENT_OTHER): Payer: Self-pay

## 2024-03-06 ENCOUNTER — Ambulatory Visit (INDEPENDENT_AMBULATORY_CARE_PROVIDER_SITE_OTHER): Payer: Self-pay | Admitting: Sports Medicine

## 2024-03-06 VITALS — BP 104/73 | HR 100 | Temp 98.0°F | Wt 166.0 lb

## 2024-03-06 DIAGNOSIS — J0100 Acute maxillary sinusitis, unspecified: Secondary | ICD-10-CM | POA: Diagnosis not present

## 2024-03-06 MED ORDER — AMOXICILLIN-POT CLAVULANATE 875-125 MG PO TABS
1.0000 | ORAL_TABLET | Freq: Two times a day (BID) | ORAL | 0 refills | Status: AC
  Filled 2024-03-06: qty 20, 10d supply, fill #0

## 2024-03-06 NOTE — Progress Notes (Signed)
 Careteam: Patient Care Team: Catherine Charlies LABOR, DO as PCP - General (Family Medicine) Sheldon Standing, MD as Consulting Physician (General Surgery) Armbruster, Standing SQUIBB, MD as Consulting Physician (Gastroenterology) Fredirick Glenys RAMAN, MD as Consulting Physician (Obstetrics and Gynecology) Skeet Juliene SAUNDERS, DO as Consulting Physician (Neurology) Kandis, Devaughn Sayres, MD as Consulting Physician (Obstetrics and Gynecology) Lola Donnice HERO, MD as Consulting Physician (Family Medicine) Tobb, Kardie, DO as Consulting Physician (Cardiology)  PLACE OF SERVICE:  [facility]  Advanced Directive information    Allergies  Allergen Reactions   Latex Swelling and Rash   Barley Grass Hives   Cranberry Juice Powder Other (See Comments)    Throat pain.   Naproxen Other (See Comments)    Liver starts hurting Diarrhea   Nickel     Skin peeling   Sulfa Antibiotics Hives and Swelling    Chief Complaint  Patient presents with   Facial Pain    Pt started having nasal congestion and post nasal drainage since last week. She is now having facial pain and pressure and when she blew her nose this morning she saw  blood.     Discussed the use of AI scribe software for clinical note transcription with the patient, who gave verbal consent to proceed.  History of Present Illness    Cindy Jones is a 33 year old female who presents with sinus headaches and facial pressure.  Approximately two weeks ago, she developed a scratchy throat and subsequently caught a cold. Although she initially thought the cold had resolved, she has been experiencing sinus headaches every morning. Over the past week, her symptoms have worsened, including facial pressure, swelling under her eyes, and ear pain. She describes her throat as sore due to postnasal drip and notes that her ears sometimes pop.  No fever, nausea, vomiting, chest pain, cough, or diarrhea. She has a runny nose that feels congested in her sinuses. She  experiences dizziness and lightheadedness, which she attributes to her sinus issues. She also mentions neck pain, which she believes is related to her sinus discomfort.  She has a history of allergies, particularly to cats, which exacerbated her symptoms after a recent exposure. She took four children's Benadryl  to manage the allergic reaction. She has tried over-the-counter medications such as DayQuil, NyQuil, Sudafed, and Mucinex, but found them ineffective. She also attempted using a neti pot but had to stop due to pressure behind her eyes.  Her symptoms began last Wednesday, making it over a week since onset. She is currently on her menstrual period and notes that she often gets sick during this time. She has not experienced any significant relief from her symptoms and reports that she is unable to work.  Review of Systems:  Review of Systems  Constitutional:  Negative for chills, fever and malaise/fatigue.  HENT:  Positive for congestion, ear pain, sinus pain and sore throat. Negative for ear discharge.   Respiratory:  Negative for cough, sputum production and shortness of breath.   Cardiovascular:  Negative for chest pain, palpitations and leg swelling.  Gastrointestinal:  Negative for abdominal pain, heartburn and nausea.  Genitourinary:  Negative for dysuria, frequency and hematuria.  Musculoskeletal:  Negative for falls and myalgias.  Neurological:  Positive for headaches. Negative for dizziness.   Negative unless indicated in HPI.   Past Medical History:  Diagnosis Date   Allergy n/a   Anxiety    Asthma    as a child   Binge eating disorder  Dysrhythmia     I feel it about once a month   Frequent headaches    GAD (generalized anxiety disorder)    GERD (gastroesophageal reflux disease)    History of acute PID 06/09/2019   Major depression in complete remission    Migraines    PID (acute pelvic inflammatory disease)    Seasonal allergies    Tourette disorder     Urinary tract bacterial infections    Past Surgical History:  Procedure Laterality Date   CHOLECYSTECTOMY     ESOPHAGOGASTRODUODENOSCOPY     LAPAROSCOPIC CHOLECYSTECTOMY SINGLE SITE WITH INTRAOPERATIVE CHOLANGIOGRAM N/A 03/15/2016   Procedure: LAPAROSCOPIC CHOLECYSTECTOMY SINGLE SITE WITH INTRAOPERATIVE CHOLANGIOGRAM;  Surgeon: Elspeth Schultze, MD;  Location: [facility] OR;  Service: General;  Laterality: N/A;   TONSILLECTOMY AND ADENOIDECTOMY  2013   WISDOM TOOTH EXTRACTION     Social History:   reports that she has never smoked. She has never used smokeless tobacco. She reports that she does not currently use alcohol after a past usage of about 1.0 standard drink of alcohol per week. She reports that she does not use drugs.  Family History  Problem Relation Age of Onset   Alcohol abuse Mother    Bipolar disorder Mother    Cancer Father        sarcoma; passed away when pt was 8   Asthma Sister    Allergic rhinitis Sister    Eczema Sister    Emphysema Maternal Grandmother    Tourette syndrome Maternal Grandfather    Emphysema Paternal Grandmother    Heart disease Paternal Grandfather    Tourette syndrome Maternal Aunt    Alcohol abuse Maternal Aunt    Alcohol abuse Maternal Uncle    Hyperlipidemia Paternal Aunt    Hashimoto's thyroiditis Paternal Aunt    Suicidality Neg Hx    Urticaria Neg Hx    Angioedema Neg Hx     Medications: Patient's Medications  New Prescriptions   No medications on file  Previous Medications   AMPHETAMINE -DEXTROAMPHETAMINE  (ADDERALL  XR) 20 MG 24 HR CAPSULE    Take 1 capsule (20 mg total) by mouth daily.   BUPROPION  (WELLBUTRIN  XL) 300 MG 24 HR TABLET    Take 1 tablet (300 mg total) by mouth every morning.   CHOLECALCIFEROL (VITAMIN D3) 25 MCG (1000 UNIT) TABLET    Take 1,000 Units by mouth daily.  Modified Medications   No medications on file  Discontinued Medications   No medications on file    Physical Exam: Vitals:   03/06/24 0930  BP: 104/73   Pulse: 100  Temp: 98 F (36.7 C)  TempSrc: Oral  SpO2: 98%  Weight: 166 lb (75.3 kg)   Body mass index is 26.79 kg/m. BP Readings from Last 3 Encounters:  03/06/24 104/73  02/06/24 110/72  07/10/23 109/74   Wt Readings from Last 3 Encounters:  03/06/24 166 lb (75.3 kg)  02/06/24 166 lb (75.3 kg)  07/10/23 192 lb 3.2 oz (87.2 kg)    Physical Exam Constitutional:      Appearance: Normal appearance.  HENT:     Head: Normocephalic and atraumatic.     Right Ear: Tympanic membrane normal.     Left Ear: Tympanic membrane normal.     Nose: Congestion present.     Mouth/Throat:     Pharynx: Posterior oropharyngeal erythema present. No oropharyngeal exudate.  Cardiovascular:     Rate and Rhythm: Normal rate and regular rhythm.  Pulmonary:     Effort: Pulmonary  effort is normal. No respiratory distress.     Breath sounds: Normal breath sounds. No wheezing.  Abdominal:     General: Bowel sounds are normal. There is no distension.     Tenderness: There is no abdominal tenderness. There is no guarding or rebound.     Comments:    Musculoskeletal:        General: No swelling or tenderness.  Neurological:     Mental Status: She is alert. Mental status is at baseline.     Motor: No weakness.     Labs reviewed: Basic Metabolic Panel: Recent Labs    02/06/24 0919  NA 139  K 4.5  CL 102  CO2 31  GLUCOSE 83  BUN 13  CREATININE 0.75  CALCIUM 8.9  TSH 1.40   Liver Function Tests: Recent Labs    02/06/24 0919  AST 17  ALT 21  ALKPHOS 40  BILITOT 0.5  PROT 6.9  ALBUMIN 4.7   No results for input(s): LIPASE, AMYLASE in the last 8760 hours. No results for input(s): AMMONIA in the last 8760 hours. CBC: Recent Labs    02/06/24 0919  WBC 6.3  HGB 13.4  HCT 40.2  MCV 90.9  PLT 211.0   Lipid Panel: Recent Labs    02/06/24 0919  CHOL 105  HDL 41.40  LDLCALC 52  TRIG 56.0  CHOLHDL 3   TSH: Recent Labs    02/06/24 0919  TSH 1.40   A1C: Lab  Results  Component Value Date   HGBA1C 5.3 02/06/2024    Assessment and Plan Assessment & Plan   1. Acute non-recurrent maxillary sinusitis (Primary) Maxillary sinus tenderness Afebrile Vitals stable Will send augmentin  to her pharmacy Instructed to use cephalo lozenges for sore throat  Use salt water gargling Take claritin   Use nettipot  Work note provided to patient   - amoxicillin -clavulanate (AUGMENTIN ) 875-125 MG tablet; Take 1 tablet by mouth 2 (two) times daily.  Dispense: 20 tablet; Refill: 0     No follow-ups on file.:  if no improvement in her symptoms   Nikash Mortensen

## 2024-03-18 ENCOUNTER — Other Ambulatory Visit (HOSPITAL_BASED_OUTPATIENT_CLINIC_OR_DEPARTMENT_OTHER): Payer: Self-pay

## 2024-03-18 ENCOUNTER — Other Ambulatory Visit (HOSPITAL_COMMUNITY): Payer: Self-pay

## 2024-03-25 ENCOUNTER — Other Ambulatory Visit: Payer: Self-pay

## 2024-03-25 ENCOUNTER — Other Ambulatory Visit (HOSPITAL_BASED_OUTPATIENT_CLINIC_OR_DEPARTMENT_OTHER): Payer: Self-pay

## 2024-03-28 ENCOUNTER — Other Ambulatory Visit (HOSPITAL_BASED_OUTPATIENT_CLINIC_OR_DEPARTMENT_OTHER): Payer: Self-pay

## 2024-04-02 ENCOUNTER — Encounter (HOSPITAL_COMMUNITY): Payer: Self-pay

## 2024-04-02 NOTE — Telephone Encounter (Signed)
 It is a possible side effects of Wellbutrin . She can cur down to 150 mg. If agree then call new prescription. Thanks

## 2024-04-03 ENCOUNTER — Other Ambulatory Visit (HOSPITAL_BASED_OUTPATIENT_CLINIC_OR_DEPARTMENT_OTHER): Payer: Self-pay

## 2024-04-03 ENCOUNTER — Other Ambulatory Visit (HOSPITAL_COMMUNITY): Payer: Self-pay | Admitting: *Deleted

## 2024-04-03 DIAGNOSIS — F988 Other specified behavioral and emotional disorders with onset usually occurring in childhood and adolescence: Secondary | ICD-10-CM

## 2024-04-03 DIAGNOSIS — F411 Generalized anxiety disorder: Secondary | ICD-10-CM

## 2024-04-03 DIAGNOSIS — F952 Tourette's disorder: Secondary | ICD-10-CM

## 2024-04-03 MED ORDER — BUPROPION HCL ER (XL) 150 MG PO TB24
150.0000 mg | ORAL_TABLET | ORAL | 1 refills | Status: DC
  Filled 2024-04-03: qty 30, 30d supply, fill #0

## 2024-05-12 ENCOUNTER — Other Ambulatory Visit (HOSPITAL_BASED_OUTPATIENT_CLINIC_OR_DEPARTMENT_OTHER): Payer: Self-pay

## 2024-05-12 ENCOUNTER — Telehealth (HOSPITAL_COMMUNITY): Admitting: Psychiatry

## 2024-05-12 ENCOUNTER — Encounter (HOSPITAL_COMMUNITY): Payer: Self-pay | Admitting: Psychiatry

## 2024-05-12 VITALS — Wt 166.0 lb

## 2024-05-12 DIAGNOSIS — F988 Other specified behavioral and emotional disorders with onset usually occurring in childhood and adolescence: Secondary | ICD-10-CM | POA: Diagnosis not present

## 2024-05-12 DIAGNOSIS — F411 Generalized anxiety disorder: Secondary | ICD-10-CM

## 2024-05-12 DIAGNOSIS — F952 Tourette's disorder: Secondary | ICD-10-CM | POA: Diagnosis not present

## 2024-05-12 MED ORDER — AMPHETAMINE-DEXTROAMPHET ER 15 MG PO CP24
15.0000 mg | ORAL_CAPSULE | Freq: Every day | ORAL | 0 refills | Status: AC
  Filled 2024-05-12: qty 30, 30d supply, fill #0

## 2024-05-12 MED ORDER — BUPROPION HCL ER (XL) 150 MG PO TB24
150.0000 mg | ORAL_TABLET | ORAL | 0 refills | Status: AC
  Filled 2024-05-12: qty 30, 30d supply, fill #0

## 2024-05-12 NOTE — Progress Notes (Signed)
 " [facility] MD Virtual Progress Note   Patient Location: Home Provider Location: Home Office  I connect with patient by video and verified that I am speaking with correct person by using two identifiers. I discussed the limitations of evaluation and management by telemedicine and the availability of in person appointments. I also discussed with the patient that there may be a patient responsible charge related to this service. The patient expressed understanding and agreed to proceed.  Cindy Jones [id number] 34 y.o.  05/12/2024 10:27 AM  History of Present Illness:  Patient is evaluated by video session.  She is now taking a lower dose of Adderall  and Wellbutrin .  Higher dose of Wellbutrin  causes jaw clenching and jitteriness.  She called our office and dose was reduced.  She is doing much better.  She also not taking the Adderall  on the weekends and holidays.  Her Christmas was good.  Her 69-year-old going to preschool and started jumping and she had decided to have a trampoline which she enjoys.  Patient reported attention focus is good.  She is able to multitasking and time management.  Patient is going in person few days a week.  She denies drinking or using any illegal substances.  Patient lives with her husband who is a publishing rights manager.  Patient has no tremor or shakes or any EPS.  She reported her Tourette's get worse when she is under stress.  She was hoping reducing the dose of Adderall  may help the Tourette symptoms but it did not happen.  She still have episodes of Tourette when she gets stressed out.  Her sleep is good.  Her appetite is okay.  Past Psychiatric History: H/O bulimia nervosa, Tourette with tics, anxiety , ADHD and depression. Saw Dr. Brutus since 2015.  Ritalin made angry and Vyvanse  did not work.  On meds since age 60 until age 67 when got pregnant.  Restarted medication in 2019 and added Wellbutrin . On Adderall  XR 20 mg for many years but given 5 mg IR  in the school.    Past Medical History:  Diagnosis Date   Allergy n/a   Anxiety    Asthma    as a child   Binge eating disorder    Dysrhythmia     I feel it about once a month   Frequent headaches    GAD (generalized anxiety disorder)    GERD (gastroesophageal reflux disease)    History of acute PID 06/09/2019   Major depression in complete remission    Migraines    PID (acute pelvic inflammatory disease)    Seasonal allergies    Tourette disorder    Urinary tract bacterial infections     Outpatient Encounter Medications as of 05/12/2024  Medication Sig   amoxicillin -clavulanate (AUGMENTIN ) 875-125 MG tablet Take 1 tablet by mouth 2 (two) times daily.   amphetamine -dextroamphetamine  (ADDERALL  XR) 20 MG 24 hr capsule Take 1 capsule (20 mg total) by mouth daily.   buPROPion  (WELLBUTRIN  XL) 150 MG 24 hr tablet Take 1 tablet (150 mg total) by mouth every morning.   cholecalciferol (VITAMIN D3) 25 MCG (1000 UNIT) tablet Take 1,000 Units by mouth daily.   No facility-administered encounter medications on file as of 05/12/2024.    No results found for this or any previous visit (from the past 2160 hours).    Psychiatric Specialty Exam: Physical Exam  Review of Systems  Weight 166 lb (75.3 kg), not currently breastfeeding.There is no height or weight on file  to calculate BMI.  General Appearance: Casual  Eye Contact:  Good  Speech:  Clear and Coherent  Volume:  Normal  Mood:  Euthymic  Affect:  Appropriate  Thought Process:  Goal Directed  Orientation:  Full (Time, Place, and Person)  Thought Content:  Logical  Suicidal Thoughts:  No  Homicidal Thoughts:  No  Memory:  Immediate;   Good Recent;   Good Remote;   Good  Judgement:  Good  Insight:  Present  Psychomotor Activity:  Normal  Concentration:  Concentration: Good and Attention Span: Good  Recall:  Good  Fund of Knowledge:  Good  Language:  Good  Akathisia:  No  Handed:  Right  AIMS (if indicated):      Assets:  Communication Skills Desire for Improvement Housing Resilience Social Support Talents/Skills Transportation  ADL's:  Intact  Cognition:  WNL  Sleep: Mostly okay.  Occasionally takes melatonin.          02/06/2024    9:22 AM 07/10/2023    1:09 PM 11/13/2022    9:21 AM 08/31/2022    9:38 AM 08/10/2022   10:08 AM  Depression screen PHQ 2/9  Decreased Interest 0 0 0 0 0  Down, Depressed, Hopeless 0 0 0 0 0  PHQ - 2 Score 0 0 0 0 0  Altered sleeping 0  0    Tired, decreased energy 1  1    Change in appetite 0  0    Feeling bad or failure about yourself  1  0    Trouble concentrating 0  0    Moving slowly or fidgety/restless 0  0    Suicidal thoughts 0  0    PHQ-9 Score 2   1     Difficult doing work/chores Not difficult at all         Data saved with a previous flowsheet row definition    Assessment/Plan: ADD (attention deficit disorder) without hyperactivity - Plan: buPROPion  (WELLBUTRIN  XL) 150 MG 24 hr tablet, amphetamine -dextroamphetamine  (ADDERALL  XR) 15 MG 24 hr capsule  Tourette disease - Plan: buPROPion  (WELLBUTRIN  XL) 150 MG 24 hr tablet, amphetamine -dextroamphetamine  (ADDERALL  XR) 15 MG 24 hr capsule  GAD (generalized anxiety disorder) - Plan: buPROPion  (WELLBUTRIN  XL) 150 MG 24 hr tablet  Patient is 34 year old married employed female with history of generalized anxiety disorder, ADHD, Tourette's disease.  Due to the side effects of higher Wellbutrin  dose is now back to 150.  So far tolerating very well and do not have any jaw clenching.  She also reduced Adderall  and taking 50 mg only on the weekdays.  Discussed medication side effects and benefits.  She like to keep the current dose for now.  Continue Wellbutrin  XL 150 mg in the morning and Adderall  XR 15 mg daily.  Recommend to call back if she has any question or any concern.  Follow-up in 3 months.   Follow Up Instructions:     I discussed the assessment and treatment plan with the patient. The patient  was provided an opportunity to ask questions and all were answered. The patient agreed with the plan and demonstrated an understanding of the instructions.   The patient was advised to call back or seek an in-person evaluation if the symptoms worsen or if the condition fails to improve as anticipated.    Collaboration of Care: Other provider involved in patient's care AEB notes are available in epic to review.  Patient/Guardian was advised Release of Information must be obtained prior  to any record release in order to collaborate their care with an outside provider. Patient/Guardian was advised if they have not already done so to contact the registration department to sign all necessary forms in order for us  to release information regarding their care.   Consent: Patient/Guardian gives verbal consent for treatment and assignment of benefits for services provided during this visit. Patient/Guardian expressed understanding and agreed to proceed.     Total encounter time 17 minutes which includes face-to-face time, chart reviewed, care coordination, order entry and documentation during this encounter.   Note: This document was prepared by [employer] voice dictation technology and any errors that results from this process are unintentional.    Leni ONEIDA Client, MD 05/12/2024   "

## 2024-08-11 ENCOUNTER — Telehealth (HOSPITAL_COMMUNITY): Admitting: Psychiatry
